# Patient Record
Sex: Male | Born: 1960 | Race: Black or African American | Hispanic: No | Marital: Single | Smoking: Current some day smoker
Health system: Southern US, Community
[De-identification: ages and names within clinical notes are randomized; demographics above are authoritative.]

## PROBLEM LIST (undated history)

## (undated) DIAGNOSIS — R945 Abnormal results of liver function studies: Secondary | ICD-10-CM

## (undated) DIAGNOSIS — D126 Benign neoplasm of colon, unspecified: Secondary | ICD-10-CM

## (undated) DIAGNOSIS — D649 Anemia, unspecified: Secondary | ICD-10-CM

## (undated) DIAGNOSIS — F329 Major depressive disorder, single episode, unspecified: Secondary | ICD-10-CM

## (undated) DIAGNOSIS — R7989 Other specified abnormal findings of blood chemistry: Secondary | ICD-10-CM

## (undated) DIAGNOSIS — F32A Depression, unspecified: Secondary | ICD-10-CM

## (undated) HISTORY — DX: Other specified abnormal findings of blood chemistry: R79.89

## (undated) HISTORY — PX: FRACTURE SURGERY: SHX138

## (undated) HISTORY — DX: Anemia, unspecified: D64.9

## (undated) HISTORY — DX: Benign neoplasm of colon, unspecified: D12.6

## (undated) HISTORY — DX: Major depressive disorder, single episode, unspecified: F32.9

## (undated) HISTORY — DX: Depression, unspecified: F32.A

## (undated) HISTORY — DX: Abnormal results of liver function studies: R94.5

---

## 2005-01-07 ENCOUNTER — Ambulatory Visit: Payer: Self-pay | Admitting: Internal Medicine

## 2006-05-15 ENCOUNTER — Ambulatory Visit: Payer: Self-pay | Admitting: Internal Medicine

## 2006-06-05 ENCOUNTER — Ambulatory Visit: Payer: Self-pay | Admitting: Internal Medicine

## 2011-08-02 ENCOUNTER — Encounter: Payer: Self-pay | Admitting: Internal Medicine

## 2011-10-18 ENCOUNTER — Encounter: Payer: Self-pay | Admitting: Internal Medicine

## 2011-11-14 ENCOUNTER — Encounter: Payer: Self-pay | Admitting: *Deleted

## 2011-11-22 ENCOUNTER — Ambulatory Visit: Payer: Self-pay | Admitting: Internal Medicine

## 2011-11-29 ENCOUNTER — Telehealth: Payer: Self-pay | Admitting: Internal Medicine

## 2011-11-29 NOTE — Telephone Encounter (Signed)
Message copied by Arna Snipe on Tue Nov 29, 2011  1:47 PM ------      Message from: Richardson Chiquito      Created: Tue Nov 22, 2011  1:11 PM      Regarding: FW: no show                   ----- Message -----         From: Hart Carwin, MD         Sent: 11/22/2011  12:56 PM           To: Vernia Buff, CMA      Subject: no show                                                  Please charge no show.      ----- Message -----         From: Vernia Buff, CMA         Sent: 11/22/2011  11:20 AM           To: Hart Carwin, MD            Patient no showed appointment with Dr Juanda Chance on 11/22/11. Dr Juanda Chance, do you want to charge no show fee?

## 2012-06-28 ENCOUNTER — Encounter: Payer: Self-pay | Admitting: Internal Medicine

## 2013-08-14 ENCOUNTER — Encounter (HOSPITAL_COMMUNITY): Payer: Self-pay | Admitting: Emergency Medicine

## 2013-08-14 ENCOUNTER — Emergency Department (HOSPITAL_COMMUNITY): Payer: Self-pay

## 2013-08-14 ENCOUNTER — Emergency Department (INDEPENDENT_AMBULATORY_CARE_PROVIDER_SITE_OTHER)
Admission: EM | Admit: 2013-08-14 | Discharge: 2013-08-14 | Disposition: A | Discharge status: Home / Self Care | Payer: Self-pay | Source: Home / Self Care

## 2013-08-14 DIAGNOSIS — L02619 Cutaneous abscess of unspecified foot: Secondary | ICD-10-CM

## 2013-08-14 DIAGNOSIS — L03032 Cellulitis of left toe: Secondary | ICD-10-CM

## 2013-08-14 MED ORDER — CLINDAMYCIN HCL 300 MG PO CAPS: 300.0000 mg | ORAL_CAPSULE | Freq: Four times a day (QID) | ORAL | Status: DC

## 2013-08-14 NOTE — ED Notes (Signed)
Went to get patient doctors still in with patient.

## 2013-08-14 NOTE — ED Provider Notes (Signed)
CSN: [id number]     Arrival date & time 08/14/13  1125 History   First MD Initiated Contact with Patient 08/14/13 1250     Chief Complaint  Patient presents with  . Insect Bite   (Consider location/radiation/quality/duration/timing/severity/associated sxs/prior Treatment) HPI Comments: This 52 year old male presents with a swollen red tender left great toe after he felt a sting to the dorsum of the toe 1 week ago. In the past 3 days it has gotten worse   Past Medical History  Diagnosis Date  . Adenomatous colon polyp   . Elevated LFTs   . Depression   . Anemia    History reviewed. No pertinent past surgical history. Family History  Problem Relation Age of Onset  . Heart disease    . Colon polyps Cousin     maternal  . Colon polyps Cousin   . Colon polyps Maternal Aunt   . Colon cancer Maternal Grandfather    History  Substance Use Topics  . Smoking status: Current Some Day Smoker  . Smokeless tobacco: Not on file  . Alcohol Use: Yes     Comment: 4 beers weekly    Review of Systems  Constitutional: Negative.   Respiratory: Negative.   Gastrointestinal: Negative.   Genitourinary: Negative.   Musculoskeletal:       As per HPI  Skin: Negative.   Neurological: Negative for dizziness, weakness, numbness and headaches.    Allergies  Review of patient's allergies indicates no known allergies.  Home Medications  No current outpatient prescriptions on file. BP 112/71  Pulse 77  Temp(Src) 97 F (36.1 C) (Oral)  Resp 20  SpO2 100% Physical Exam  Nursing note and vitals reviewed. Constitutional: He is oriented to person, place, and time. He appears well-developed and well-nourished.  HENT:  Head: Normocephalic and atraumatic.  Eyes: EOM are normal. Left eye exhibits no discharge.  Neck: Normal range of motion. Neck supple.  Pulmonary/Chest: Effort normal.  Musculoskeletal:  Redness and swelling primarily to the dorsal aspect of the great toe just proximal to the  base of the nail. It involves the distal phalanx and PIP joint. Redness extends to the MTP. He is able to flex and extend but with some discomfort. There is tenderness primarily over the IP joint. No drainage.  Neurological: He is alert and oriented to person, place, and time. No cranial nerve deficit.  Skin: Skin is warm and dry.  Psychiatric: He has a normal mood and affect.    ED Course  INCISION AND DRAINAGE Date/Time: 08/14/2013 1:30 PM Performed by: Phineas Real, Gillie Fleites Authorized by: Clementeen Graham, S Consent: Verbal consent obtained. Risks and benefits: risks, benefits and alternatives were discussed Consent given by: patient Patient understanding: patient states understanding of the procedure being performed Patient identity confirmed: verbally with patient Type: abscess Body area: lower extremity Location details: left big toe Anesthesia: local infiltration Local anesthetic: lidocaine 2% without epinephrine Anesthetic total: 1 ml Patient sedated: no Scalpel size: 11 Incision type: single straight Complexity: simple Drainage: bloody Drainage amount: scant Patient tolerance: Patient tolerated the procedure well with no immediate complications. Comments: dfressing applied   (including critical care time) Labs Review Labs Reviewed - No data to display Imaging Review No results found.     MDM   1. Cellulitis of great toe of left foot     Consulted with Dr. Denyse Amass U/S of toe per Dr. Denyse Amass does not reveal tenosyovitis.  Tangential incision with #11 blade released blood only, culture sent. Keflex qid  Return in 2 days for wound chk. Dressing applied.  Hayden Rasmussen, NP 08/14/13 1345

## 2013-08-14 NOTE — ED Provider Notes (Signed)
Limited musculoskeletal ultrasound of the left great toe. Ultrasound placed across the area of swelling overlying the interphalangeal joint on the dorsal aspect of the left great toe. Localized heterogeneous cystic structure are consistent with partial abscess or cellulitis located. No involvement of the joint itself. The dorsal tendon appear to be intact and unaffected extending proximally.  Consistent with cellulitis or abscess. Rodolph Bong, MD 08/14/13 [phone]

## 2013-08-14 NOTE — ED Notes (Signed)
C/o insect bite on left big toe for five days now.   States he took his socks off and the next day putting them on he felt a little prick.   States he does have numbness in bilateral feet.   Toe is red and swollen.

## 2013-08-15 NOTE — ED Provider Notes (Signed)
Medical screening examination/treatment/procedure(s) were performed by a resident physician or non-physician practitioner and as the supervising physician I was immediately available for consultation/collaboration.  Kymia Simi, MD    Caitlynn Ju S Leena Tiede, MD 08/15/13 0756 

## 2013-08-18 LAB — CULTURE, ROUTINE-ABSCESS: Culture: NO GROWTH

## 2013-09-14 ENCOUNTER — Emergency Department (INDEPENDENT_AMBULATORY_CARE_PROVIDER_SITE_OTHER)
Admission: EM | Admit: 2013-09-14 | Discharge: 2013-09-14 | Disposition: A | Discharge status: Home / Self Care | Payer: No Typology Code available for payment source | Source: Home / Self Care | Attending: Emergency Medicine | Admitting: Emergency Medicine

## 2013-09-14 ENCOUNTER — Emergency Department (INDEPENDENT_AMBULATORY_CARE_PROVIDER_SITE_OTHER): Payer: No Typology Code available for payment source

## 2013-09-14 ENCOUNTER — Encounter (HOSPITAL_COMMUNITY): Payer: Self-pay | Admitting: Emergency Medicine

## 2013-09-14 DIAGNOSIS — N189 Chronic kidney disease, unspecified: Secondary | ICD-10-CM

## 2013-09-14 DIAGNOSIS — G629 Polyneuropathy, unspecified: Secondary | ICD-10-CM

## 2013-09-14 DIAGNOSIS — G609 Hereditary and idiopathic neuropathy, unspecified: Secondary | ICD-10-CM

## 2013-09-14 DIAGNOSIS — Z23 Encounter for immunization: Secondary | ICD-10-CM

## 2013-09-14 DIAGNOSIS — M7989 Other specified soft tissue disorders: Secondary | ICD-10-CM

## 2013-09-14 LAB — POCT I-STAT, CHEM 8
Creatinine, Ser: 1.3 mg/dL (ref 0.50–1.35)
Glucose, Bld: 71 mg/dL (ref 70–99)
HCT: 40 % (ref 39.0–52.0)
Hemoglobin: 13.6 g/dL (ref 13.0–17.0)
Potassium: 4.2 mEq/L (ref 3.5–5.1)
Sodium: 143 mEq/L (ref 135–145)
TCO2: 25 mmol/L (ref 0–100)

## 2013-09-14 MED ORDER — TETANUS-DIPHTH-ACELL PERTUSSIS 5-2.5-18.5 LF-MCG/0.5 IM SUSP
INTRAMUSCULAR | Status: AC
  Filled 2013-09-14: qty 0.5

## 2013-09-14 MED ORDER — TETANUS-DIPHTH-ACELL PERTUSSIS 5-2.5-18.5 LF-MCG/0.5 IM SUSP
0.5000 mL | Freq: Once | INTRAMUSCULAR | Status: AC
  Administered 2013-09-14: 0.5 mL via INTRAMUSCULAR

## 2013-09-14 MED ORDER — PREDNISONE 10 MG PO TABS: ORAL_TABLET | ORAL | Status: DC

## 2013-09-14 NOTE — ED Provider Notes (Signed)
Chief Complaint:   Chief Complaint  Patient presents with  . Cellulitis    History of Present Illness:   Ryan [name] is a 52 year old male whose left foot swelled up about 3-1/2 weeks ago. He came here and was found to have a pustular lesion on his left great toe. This was incised and drained and he was placed on Cleocin. The culture never did grow out any bacteria. The swelling is gone down, but it still not back to normal and it hurts. He notes that both his feet have been numb for the past 2-1/2-3 years he denies any history of diabetes.  Review of Systems:  Other than noted above, the patient denies any of the following symptoms: Systemic:  No fevers, chills, or sweats.  No fatigue or tiredness. Musculoskeletal:  No joint pain, arthritis, bursitis, swelling, or back pain.  Neurological:  No muscular weakness, paresthesias.  PMFSH:  Past medical history, family history, social history, meds, and allergies were reviewed.  No history of gout.   Physical Exam:   Vital signs:  BP 105/75  Pulse 74  Temp(Src) 97.8 F (36.6 C) (Oral)  Resp 20  SpO2 100% Gen:  Alert and oriented times 3.  In no distress. Musculoskeletal:  Exam of the foot reveals slight puffiness and discoloration, but no erythema or heat.  Otherwise, all joints had a full a ROM with no swelling, bruising or deformity.  No edema, pulses full. Extremities were warm and pink.  Capillary refill was brisk.  Skin:  Clear, warm and dry.  No rash. Neuro:  Alert and oriented times 3.  Muscle strength was normal.  Sensation was intact to light touch.   Radiology:  Dg Foot Complete Left  09/14/2013   CLINICAL DATA:  History of swelling and pain along the 1st metatarsal and great toe.  EXAM: LEFT FOOT - COMPLETE 3+ VIEW  COMPARISON:  None.  FINDINGS: The bones superior diffusely osteopenic. No fracture or subluxation identified. No radio-opaque foreign body or soft tissue calcification. No focal bone erosions.  IMPRESSION: 1.  Osteopenia.   Electronically Signed   By: Signa Kell M.D.   On: 09/14/2013 10:50   I reviewed the images independently and personally and concur with the radiologist's findings.  Results for orders placed during the hospital encounter of 09/14/13  URIC ACID      Result Value Range   Uric Acid, Serum 9.0 (*) 4.0 - 7.8 mg/dL  POCT I-STAT, CHEM 8      Result Value Range   Sodium 143  135 - 145 mEq/L   Potassium 4.2  3.5 - 5.1 mEq/L   Chloride 108  96 - 112 mEq/L   BUN 6  6 - 23 mg/dL   Creatinine, Ser 1.61  0.50 - 1.35 mg/dL   Glucose, Bld 71  70 - 99 mg/dL   Calcium, Ion 0.96  0.45 - 1.23 mmol/L   TCO2 25  0 - 100 mmol/L   Hemoglobin 13.6  13.0 - 17.0 g/dL   HCT 40.9  81.1 - 91.4 %   Assessment:  The primary encounter diagnosis was Foot swelling. Diagnoses of Peripheral neuropathy and Chronic kidney disease were also pertinent to this visit.  Plan:   1.  Meds:  The following meds were prescribed:   Discharge Medication List as of 09/14/2013 11:32 AM    START taking these medications   Details  predniSONE (DELTASONE) 10 MG tablet Take 4 tabs daily for 4 days, 3 tabs daily  for 4 days, 2 tabs daily for 4 days, then 1 tab daily for 4 days., Normal        2.  Patient Education/Counseling:  The patient was given appropriate handouts, self care instructions, and instructed in symptomatic relief including rest and activity, elevation, application of ice and compression.   3.  Follow up:  The patient was told to follow up if no better in 3 to 4 days, if becoming worse in any way, and given some red flag symptoms such as persistent inflammation which would prompt immediate return.  Follow up with a podiatrist for further evaluation the foot pain, and also with a nephrologist because of his mildly elevated creatinine.       Reuben Likes, MD 09/14/13 2027
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
## 2013-09-16 NOTE — ED Notes (Signed)
12/7 Uric Acid 9.0 H.  Treated with Prednisone.  Message sent to Dr. Lorenz Coaster.  Dr. Lorenz Coaster wrote back that  he already notified the pt. of result and to f/u with primary physician. Vassie Moselle 09/16/2013

## 2013-09-20 NOTE — ED Notes (Signed)
Patient had called earlier to request something for pain . Spoke w Dr Lorenz Coaster , who is aware pt has not yet filled his Rx for prednisone w  [facility], wishes patient to fill his Rx and take as written. Pt asking for rx to be called in to [facility] [address] @ [phone]) patient states they are closed on weekend. Patient was advised he should keep his foot elevated, use heat or cold (which ever gives him the most relief) , and use advil or tylenol for pain, and then start the Rx ASAP on Monday. Called number for Family [facility], and left message for answering service to have RN on call contact us regarding transfer of Rx to their pharmacy

## 2013-09-20 NOTE — ED Notes (Signed)
Ryan [name] from patient. C/o he has never been called about his lab reports . Discussed lab findings related to gout. States he has never filled his Rx for prednisone, and was advised to have this filled and take as directed, and to follow up w Dr Charlsie Merles as directed for his foot

## 2013-09-20 NOTE — ED Notes (Signed)
[facility] now closed, and no return call as yet from on call Rn for pt rx. Patient notified, and he asks we try again on Monday, did not want another pharmacy called for his Rx

## 2014-10-22 ENCOUNTER — Emergency Department (HOSPITAL_COMMUNITY)
Admission: EM | Admit: 2014-10-22 | Discharge: 2014-10-22 | Disposition: A | Discharge status: Home / Self Care | Payer: Self-pay | Attending: Emergency Medicine | Admitting: Emergency Medicine

## 2014-10-22 ENCOUNTER — Encounter (HOSPITAL_COMMUNITY): Payer: Self-pay | Admitting: Emergency Medicine

## 2014-10-22 DIAGNOSIS — S0181XA Laceration without foreign body of other part of head, initial encounter: Secondary | ICD-10-CM | POA: Insufficient documentation

## 2014-10-22 DIAGNOSIS — Y998 Other external cause status: Secondary | ICD-10-CM | POA: Insufficient documentation

## 2014-10-22 DIAGNOSIS — Z720 Tobacco use: Secondary | ICD-10-CM | POA: Insufficient documentation

## 2014-10-22 DIAGNOSIS — Z7952 Long term (current) use of systemic steroids: Secondary | ICD-10-CM | POA: Insufficient documentation

## 2014-10-22 DIAGNOSIS — Y9389 Activity, other specified: Secondary | ICD-10-CM | POA: Insufficient documentation

## 2014-10-22 DIAGNOSIS — W108XXA Fall (on) (from) other stairs and steps, initial encounter: Secondary | ICD-10-CM | POA: Insufficient documentation

## 2014-10-22 DIAGNOSIS — Z86010 Personal history of colonic polyps: Secondary | ICD-10-CM | POA: Insufficient documentation

## 2014-10-22 DIAGNOSIS — Z792 Long term (current) use of antibiotics: Secondary | ICD-10-CM | POA: Insufficient documentation

## 2014-10-22 DIAGNOSIS — W19XXXA Unspecified fall, initial encounter: Secondary | ICD-10-CM

## 2014-10-22 DIAGNOSIS — Y9289 Other specified places as the place of occurrence of the external cause: Secondary | ICD-10-CM | POA: Insufficient documentation

## 2014-10-22 DIAGNOSIS — Z862 Personal history of diseases of the blood and blood-forming organs and certain disorders involving the immune mechanism: Secondary | ICD-10-CM | POA: Insufficient documentation

## 2014-10-22 DIAGNOSIS — Z8659 Personal history of other mental and behavioral disorders: Secondary | ICD-10-CM | POA: Insufficient documentation

## 2014-10-22 MED ORDER — LIDOCAINE-EPINEPHRINE (PF) 2 %-1:200000 IJ SOLN
10.0000 mL | Freq: Once | INTRAMUSCULAR | Status: AC
  Administered 2014-10-22: 10 mL
  Filled 2014-10-22: qty 20

## 2014-10-22 MED ORDER — NAPROXEN 500 MG PO TABS: 500.0000 mg | ORAL_TABLET | Freq: Two times a day (BID) | ORAL | Status: DC | PRN

## 2014-10-22 MED ORDER — HYDROCODONE-ACETAMINOPHEN 5-325 MG PO TABS: 1.0000 | ORAL_TABLET | Freq: Four times a day (QID) | ORAL | Status: DC | PRN

## 2014-10-22 MED ORDER — BACITRACIN ZINC 500 UNIT/GM EX OINT: 1.0000 "application " | TOPICAL_OINTMENT | Freq: Two times a day (BID) | CUTANEOUS | Status: DC

## 2014-10-22 MED ORDER — AMOXICILLIN-POT CLAVULANATE 875-125 MG PO TABS: 1.0000 | ORAL_TABLET | Freq: Two times a day (BID) | ORAL | Status: DC

## 2014-10-22 MED ORDER — IBUPROFEN 400 MG PO TABS
400.0000 mg | ORAL_TABLET | Freq: Once | ORAL | Status: AC
  Administered 2014-10-22: 400 mg via ORAL
  Filled 2014-10-22: qty 1

## 2014-10-22 NOTE — ED Notes (Signed)
Patient states he fell on concrete steps last night while carrying wood and he fell on a piece of wood and split chin.   Bleeding controlled.

## 2014-10-22 NOTE — ED Provider Notes (Signed)
CSN: [id number]     Arrival date & time 10/22/14  5397 History  This chart was scribed for non-physician practitioner, Zacarias Pontes, PA-C working with Fredia Sorrow, MD by Frederich Balding, ED scribe. This patient was seen in room TR10C/TR10C and the patient's care was started at 10:34 AM.   Chief Complaint  Patient presents with  . chin laceration    Patient is a 54 y.o. male presenting with skin laceration. The history is provided by the patient. No language interpreter was used.  Laceration Location:  Face Facial laceration location:  Chin Length (cm):  2 Depth:  Cutaneous Bleeding: controlled   Time since incident:  11 hours Injury mechanism: wood. Pain details:    Quality:  Dull   Severity:  Moderate   Timing:  Intermittent   Progression:  Unchanged Foreign body present:  No foreign bodies Relieved by:  None tried Worsened by:  Movement Ineffective treatments:  None tried Tetanus status:  Up to date  HPI Comments: Ryan [name] is a 54 y.o. male who presents to the Emergency Department complaining of a laceration to his chin that occurred last night around 11 PM. He fell while going down concrete steps while carrying wood and states a piece of wood split his chin. Denies LOC. Rates pain around the laceration 5/10 and describes it as intermittent and dull. Pain does not radiate. Opening his mouth worsens it. He has not taken any medications. Denies loose teeth, blurry vision, chest pain, SOB, abdominal pain, nausea, emesis, neck pain, headaches, dizziness, numbness, weakness. Pt is not currently on any blood thinners. His last tetanus was a few months ago.   Past Medical History  Diagnosis Date  . Adenomatous colon polyp   . Elevated LFTs   . Depression   . Anemia    History reviewed. No pertinent past surgical history. Family History  Problem Relation Age of Onset  . Heart disease    . Colon polyps Cousin     maternal  . Colon polyps Cousin   . Colon polyps  Maternal Aunt   . Colon cancer Maternal Grandfather    History  Substance Use Topics  . Smoking status: Current Some Day Smoker  . Smokeless tobacco: Not on file  . Alcohol Use: Yes     Comment: 4 beers weekly    Review of Systems  HENT: Negative for dental problem.   Eyes: Negative for visual disturbance.  Respiratory: Negative for shortness of breath.   Cardiovascular: Negative for chest pain.  Gastrointestinal: Negative for nausea, vomiting and abdominal pain.  Musculoskeletal: Negative for neck pain.  Skin: Positive for wound.  Neurological: Negative for dizziness, weakness, numbness and headaches.  All other systems reviewed and are negative. 10 systems reviewed and are negative for acute changes except as noted in the HPI.   Allergies  Review of patient's allergies indicates no known allergies.  Home Medications   Prior to Admission medications   Medication Sig Start Date End Date Taking? Authorizing Provider  clindamycin (CLEOCIN) 300 MG capsule Take 1 capsule (300 mg total) by mouth 4 (four) times daily. X 7 days 08/14/13   Janne Napoleon, NP  predniSONE (DELTASONE) 10 MG tablet Take 4 tabs daily for 4 days, 3 tabs daily for 4 days, 2 tabs daily for 4 days, then 1 tab daily for 4 days. 09/14/13   Harden Mo, MD   BP 111/78 mmHg  Pulse 94  Temp(Src) 98.2 F (36.8 C) (Oral)  Resp 18  SpO2 100%   Physical Exam  Constitutional: He is oriented to person, place, and time. Vital signs are normal. He appears well-developed and well-nourished.  Non-toxic appearance. No distress.  Afebrile, non toxic, NAD  HENT:  Head: Normocephalic and atraumatic.    Mouth/Throat: Mucous membranes are normal.  Approx 2 cm laceration to underside of right chin. Bleeding well controlled, extension into the subcutaneous fat, no foreign bodies present. Mild tenderness to palpation to mandible in this area, with no bony crepitus. No bony TTP on remainder of mandible or TMJ. FROM intact at b/l  TMJ.   Eyes: Conjunctivae and EOM are normal.  Neck: Normal range of motion. Neck supple. No spinous process tenderness and no muscular tenderness present.  FROM intact without spinous process or paraspinous muscle TTP, no bony stepoffs or deformities, no muscle spasms.  Cardiovascular: Normal rate and intact distal pulses.   Pulmonary/Chest: Effort normal. No respiratory distress.  Abdominal: Normal appearance. He exhibits no distension.  Musculoskeletal: Normal range of motion.  MAE x4  Neurological: He is alert and oriented to person, place, and time. He has normal strength. No sensory deficit. Gait normal.  Skin: Skin is warm and dry. Laceration noted.  ~2cm lac to chin as noted above  Psychiatric: He has a normal mood and affect. His behavior is normal.  Nursing note and vitals reviewed.   ED Course  LACERATION REPAIR Date/Time: 10/22/2014 11:07 AM Performed by: Shann Medal, Alain Deschene STRUPP Authorized by: Corine Shelter Consent: Verbal consent obtained. Risks and benefits: risks, benefits and alternatives were discussed Consent given by: patient Patient understanding: patient states understanding of the procedure being performed Patient consent: the patient's understanding of the procedure matches consent given Patient identity confirmed: verbally with patient Body area: head/neck Location details: chin Laceration length: 2 cm Foreign bodies: no foreign bodies Tendon involvement: none Nerve involvement: none Vascular damage: no Anesthesia: local infiltration Local anesthetic: lidocaine 2% with epinephrine Anesthetic total: 1 ml Patient sedated: no Preparation: Patient was prepped and draped in the usual sterile fashion. Irrigation solution: saline Irrigation method: syringe Amount of cleaning: standard Debridement: none Degree of undermining: none Skin closure: 5-0 Prolene Number of sutures: 3 Technique: simple Approximation: close Approximation  difficulty: simple Dressing: 4x4 sterile gauze Patient tolerance: Patient tolerated the procedure well with no immediate complications   (including critical care time)  DIAGNOSTIC STUDIES: Oxygen Saturation is 100% on RA, normal by my interpretation.    COORDINATION OF CARE: 10:38 AM-Pt declined xray. Discussed treatment plan which includes laceration repair with pt at bedside and pt agreed to plan.   11:07 AM-Laceration repair started. 1 mL 2% lidocaine with epi and 5-0 Prolene used. Wound closed with 3 sutures.   Labs Review Labs Reviewed - No data to display  Imaging Review No results found.   EKG Interpretation None      MDM   Final diagnoses:  Chin laceration, initial encounter  Fall, initial encounter    54 y.o. male with chin lac 11hrs PTA. Bleeding controlled, small amount of oozing. Tetanus UTD. Closed area since pt was within 12hrs, and it's to his face, therefore for improved cosmesis, proceeded with suture repair. Hemostasis achieved. Dressing provided. Given that pt was delayed in coming, will start on augmentin. Will have him see his PCP/urgent care in 10 days for suture removal. Discussed proper wound care. Of note, Pt declined xrays of mandible, given that he had some TTP. Low clinical suspicion for occult fx but discussed that if there were any  fractures, it would be considered open fx, and the risks of worsened deep infection would be higher. Pt accepts the risks and proceeded with no imaging. I explained the diagnosis and have given explicit precautions to return to the ER including for any other new or worsening symptoms. The patient understands and accepts the medical plan as it's been dictated and I have answered their questions. Discharge instructions concerning home care and prescriptions have been given. The patient is STABLE and is discharged to home in good condition.  BP 111/78 mmHg  Pulse 94  Temp(Src) 98.2 F (36.8 C) (Oral)  Resp 18  SpO2  100%  Meds ordered this encounter  Medications  . ibuprofen (ADVIL,MOTRIN) tablet 400 mg    Sig:   . lidocaine-EPINEPHrine (XYLOCAINE W/EPI) 2 %-1:200000 (PF) injection 10 mL    Sig:   . amoxicillin-clavulanate (AUGMENTIN) 875-125 MG per tablet    Sig: Take 1 tablet by mouth 2 (two) times daily. One po bid x 7 days    Dispense:  14 tablet    Refill:  0    Order Specific Question:  Supervising Provider    Answer:  Noemi Chapel D [[id number]]  . HYDROcodone-acetaminophen (NORCO) 5-325 MG per tablet    Sig: Take 1 tablet by mouth every 6 (six) hours as needed for severe pain.    Dispense:  6 tablet    Refill:  0    Order Specific Question:  Supervising Provider    Answer:  Noemi Chapel D [[id number]]  . naproxen (NAPROSYN) 500 MG tablet    Sig: Take 1 tablet (500 mg total) by mouth 2 (two) times daily as needed for mild pain, moderate pain or headache (TAKE WITH MEALS.).    Dispense:  20 tablet    Refill:  0    Order Specific Question:  Supervising Provider    Answer:  Noemi Chapel D [[id number]]  . bacitracin ointment    Sig: Apply 1 application topically 2 (two) times daily.    Dispense:  120 g    Refill:  0    Order Specific Question:  Supervising Provider    Answer:  Noemi Chapel D [[id number]]     I personally performed the services described in this documentation, which was scribed in my presence. The recorded information has been reviewed and is accurate.  Patty Sermons Fox, [place] 10/22/14 1131  Fredia Sorrow, MD 10/23/14 0930

## 2014-10-22 NOTE — Discharge Instructions (Signed)
Keep wound and clean with mild soap and water. Keep area covered with a topical antibiotic ointment (bacitracin) and bandage, keep bandage dry, and do not submerge in water for 24 hours. Ice and elevate for additional pain relief and swelling. Alternate between naprosyn and Tylenol for additional pain relief. Use norco as directed for severe pain, but don't drive while taking this medicine. Don't exceed more than 3,000mg  of tylenol per day, and norco has 325mg  per tablet. Take Augmentin as directed to prevent infection. Follow up with your primary care doctor or the [facility] [place] in approximately 7-10 days for wound recheck and suture removal. Monitor area for signs of infection to include, but not limited to: increasing pain, redness, drainage/pus, or swelling. Return to emergency department for emergent changing or worsening symptoms.    Facial Laceration A facial laceration is a cut on the face. These injuries can be painful and cause bleeding. Some cuts may need to be closed with stitches (sutures), skin adhesive strips, or wound glue. Cuts usually heal quickly but can leave a scar. It can take 1-2 years for the scar to go away completely. HOME CARE   Only take medicines as told by your doctor.  Follow your doctor's instructions for wound care. For Stitches:  Keep the cut clean and dry.  If you have a bandage (dressing), change it at least once a day. Change the bandage if it gets wet or dirty, or as told by your doctor.  Wash the cut with soap and water 2 times a day. Rinse the cut with water. Pat it dry with a clean towel.  Put a thin layer of medicated cream on the cut as told by your doctor.  You may shower after the first 24 hours. Do not soak the cut in water until the stitches are removed.  Have your stitches removed as told by your doctor.  Do not wear any makeup until a few days after your stitches are removed. For Skin Adhesive Strips:  Keep the cut clean and  dry.  Do not get the strips wet. You may take a bath, but be careful to keep the cut dry.  If the cut gets wet, pat it dry with a clean towel.  The strips will fall off on their own. Do not remove the strips that are still stuck to the cut. For Wound Glue:  You may shower or take baths. Do not soak or scrub the cut. Do not swim. Avoid heavy sweating until the glue falls off on its own. After a shower or bath, pat the cut dry with a clean towel.  Do not put medicine or makeup on your cut until the glue falls off.  If you have a bandage, do not put tape over the glue.  Avoid lots of sunlight or tanning lamps until the glue falls off.  The glue will fall off on its own in 5-10 days. Do not pick at the glue. After Healing: Put sunscreen on the cut for the first year to reduce your scar. GET HELP RIGHT AWAY IF:   Your cut area gets red, painful, or puffy (swollen).  You see a yellowish-white fluid (pus) coming from the cut.  You have chills or a fever. MAKE SURE YOU:   Understand these instructions.  Will watch your condition.  Will get help right away if you are not doing well or get worse. Document Released: 03/14/2008 Document Revised: 07/17/2013 Document Reviewed: 05/09/2013 [employer] Patient Information 2015  [employer]. This information is not intended to replace advice given to you by your health care provider. Make sure you discuss any questions you have with your health care provider.  Stitches, Staples, or Skin Adhesive Strips  Stitches (sutures), staples, and skin adhesive strips hold the skin together as it heals. They will usually be in place for 7 days or less. HOME CARE  Wash your hands with soap and water before and after you touch your wound.  Only take medicine as told by your doctor.  Cover your wound only if your doctor told you to. Otherwise, leave it open to air.  Do not get your stitches wet or dirty. If they get dirty, dab them gently with a clean  washcloth. Wet the washcloth with soapy water. Do not rub. Pat them dry gently.  Do not put medicine or medicated cream on your stitches unless your doctor told you to.  Do not take out your own stitches or staples. Skin adhesive strips will fall off by themselves.  Do not pick at the wound. Picking can cause an infection.  Do not miss your follow-up appointment.  If you have problems or questions, call your doctor. GET HELP RIGHT AWAY IF:   You have a temperature by mouth above 102 F (38.9 C), not controlled by medicine.  You have chills.  You have redness or pain around your stitches.  There is puffiness (swelling) around your stitches.  You notice fluid (drainage) from your stitches.  There is a bad smell coming from your wound. MAKE SURE YOU:  Understand these instructions.  Will watch your condition.  Will get help if you are not doing well or get worse. Document Released: 07/24/2009 Document Revised: 12/19/2011 Document Reviewed: 07/24/2009 [facility] Patient Information 2015 [place], [place]. This information is not intended to replace advice given to you by your health care provider. Make sure you discuss any questions you have with your health care provider.   Sutured Wound Care Sutures are stitches that can be used to close wounds. Caring for your wound can help stop infection and lessen pain. HOME CARE   Rest and raise (elevate) the injured area until the pain and puffiness (swelling) go away.  Only take medicines as told by your doctor.  Clean the wound gently with mild soap and water once a day after the first 2 days. Rinse off the soap. Pat the area dry with a clean towel. Do not rub the wound.  Change the bandage (dressing) as told by your doctor. If the bandage sticks, soak it off with soapy water. Stop using a bandage after 2 days or after the wound stops leaking fluid.  Put cream on the wound as told by your doctor.  Do not stretch the wound.  Drink  enough fluids to keep your pee (urine) clear or pale yellow.  See your doctor to have the sutures removed.  Use sunscreen or sunblock on the wound after it heals. GET HELP RIGHT AWAY IF:   Your wound gets red, puffy, hot, or tender.  You have more pain in the wound.  You have a red streak that goes away from the wound.  You see yellowish-white fluid (pus) coming out of the wound.  You have a fever.  You have chills and start to shake.  You notice a bad smell coming from the wound.  Your wound will not stop bleeding. MAKE SURE YOU:   Understand these instructions.  Will watch your condition.  Will  get help right away if you are not doing well or get worse. Document Released: 03/14/2008 Document Revised: 12/19/2011 Document Reviewed: 01/30/2011 [facility] Patient Information 2015 [place], [place]. This information is not intended to replace advice given to you by your health care provider. Make sure you discuss any questions you have with your health care provider.

## 2014-11-08 ENCOUNTER — Emergency Department: Payer: Self-pay | Admitting: Emergency Medicine

## 2018-03-11 ENCOUNTER — Emergency Department
Admission: EM | Admit: 2018-03-11 | Discharge: 2018-03-11 | Disposition: A | Discharge status: Home / Self Care | Payer: Self-pay | Attending: Emergency Medicine | Admitting: Emergency Medicine

## 2018-03-11 ENCOUNTER — Encounter: Payer: Self-pay | Admitting: Emergency Medicine

## 2018-03-11 DIAGNOSIS — Z79899 Other long term (current) drug therapy: Secondary | ICD-10-CM | POA: Insufficient documentation

## 2018-03-11 DIAGNOSIS — H6123 Impacted cerumen, bilateral: Secondary | ICD-10-CM | POA: Insufficient documentation

## 2018-03-11 DIAGNOSIS — F17200 Nicotine dependence, unspecified, uncomplicated: Secondary | ICD-10-CM | POA: Insufficient documentation

## 2018-03-11 MED ORDER — DOCUSATE SODIUM 50 MG/5ML PO LIQD
25.0000 mg | Freq: Once | ORAL | Status: AC
  Administered 2018-03-11: 25 mg via ORAL
  Filled 2018-03-11: qty 10

## 2018-03-11 NOTE — Discharge Instructions (Signed)
We have removed the wax from your ears.  You can try Debrox 2 times a week to prevent earwax recurrence.

## 2018-03-11 NOTE — ED Triage Notes (Signed)
Patient presents to the ED with bilateral ear pain.  Patient states, "I've been pulling a lot of wax out of my head for a week and a half."  Patient states his hearing has been getting worse and it's affecting my work.

## 2018-03-11 NOTE — ED Provider Notes (Signed)
[facility] Emergency Department Provider Note ____________________________________________  Time seen: 0935  I have reviewed the triage vital signs and the nursing notes.  HISTORY  Chief Complaint  Otalgia   HPI Ryan [name] is a 57 y.o. male who presents to the ER today with complaint of bilateral ear fullness, hearing loss and wax buildup.  He reports this started 1-1/2 weeks ago.  He denies ear pain, discharge or trauma.  He has tried Hydrogen Peroxide and boiling Colace tablets to put in his ear.   Past Medical History:  Diagnosis Date  . Adenomatous colon polyp   . Anemia   . Depression   . Elevated LFTs     There are no active problems to display for this patient.   No past surgical history on file.  Prior to Admission medications   Medication Sig Start Date End Date Taking? Authorizing Provider  amoxicillin-clavulanate (AUGMENTIN) 875-125 MG per tablet Take 1 tablet by mouth 2 (two) times daily. One po bid x 7 days 10/22/14   Street, Hanaf[place], PA-C  bacitracin ointment Apply 1 application topically 2 (two) times daily. 10/22/14   Street, Mercedes, PA-C  clindamycin (CLEOCIN) 300 MG capsule Take 1 capsule (300 mg total) by mouth 4 (four) times daily. X 7 days 08/14/13   Janne Napoleon, NP  HYDROcodone-acetaminophen (NORCO) 5-325 MG per tablet Take 1 tablet by mouth every 6 (six) hours as needed for severe pain. 10/22/14   Street, Mona, PA-C  naproxen (NAPROSYN) 500 MG tablet Take 1 tablet (500 mg total) by mouth 2 (two) times daily as needed for mild pain, moderate pain or headache (TAKE WITH MEALS.). 10/22/14   Street, Mercedes, PA-C  predniSONE (DELTASONE) 10 MG tablet Take 4 tabs daily for 4 days, 3 tabs daily for 4 days, 2 tabs daily for 4 days, then 1 tab daily for 4 days. 09/14/13   Harden Mo, MD    Allergies Patient has no known allergies.  Family History  Problem Relation Age of Onset  . Heart disease Unknown   . Colon polyps Cousin         maternal  . Colon polyps Cousin   . Colon polyps Maternal Aunt   . Colon cancer Maternal Grandfather     Social History Social History   Tobacco Use  . Smoking status: Current Some Day Smoker  Substance Use Topics  . Alcohol use: Yes    Comment: 4 beers weekly  . Drug use: No    Review of Systems  Constitutional: Negative for fever. ENT: Positive for ear fullness, wax buildup and decreased hearing. ____________________________________________  PHYSICAL EXAM:  VITAL SIGNS: ED Triage Vitals  Enc Vitals Group     BP 03/11/18 0832 111/65     Pulse Rate 03/11/18 0832 81     Resp 03/11/18 0832 18     Temp 03/11/18 0832 98.5 F (36.9 C)     Temp Source 03/11/18 0832 Oral     SpO2 03/11/18 0832 100 %     Weight 03/11/18 0830 155 lb (70.3 kg)     Height 03/11/18 0830 6' 5.5" (1.969 m)     Head Circumference --      Peak Flow --      Pain Score 03/11/18 0829 3     Pain Loc --      Pain Edu? --      Excl. in [place]? --     Constitutional: Alert and oriented. Well appearing and in no distress.  Head: Normocephalic and atraumatic. Ears: Bilateral cerumen impaction. _____________________________________________  PROCEDURES  .Ear Cerumen Removal Date/Time: 03/11/2018 10:13 AM Performed by: McLamb, Berniece Salines, RN Authorized by: Jearld Fenton, NP   Consent:    Consent obtained:  Verbal   Consent given by:  Patient   Risks discussed:  Pain, dizziness and incomplete removal Procedure details:    Location:  L ear and R ear   Procedure type: irrigation   Post-procedure details:    Inspection:  Macerated skin and TM intact   Hearing quality:  Improved   Patient tolerance of procedure:  Tolerated well, no immediate complications   ____________________________________________  INITIAL IMPRESSION / ASSESSMENT AND PLAN / ED COURSE Bilateral Ear Fullness, Hearing Loss secondary to Cerumen Impaction:  Colace drops placed in bilateral ears  Manual lavage by RN Advised  him to try Debrox drops 2 times a week to prevent wax recurrence No indication of infection at this time  FINAL CLINICAL IMPRESSION(S) / ED DIAGNOSES  Final diagnoses:  Hearing loss of both ears due to cerumen impaction      Jearld Fenton, NP 03/11/18 [place]    Arta Silence, MD 03/11/18 1338

## 2018-03-11 NOTE — ED Notes (Signed)
See triage note. States he developed some possible wax build up to both ears  States he has used ear drops and liquid colace  But states he is still having decreased hearing to both ears

## 2018-07-16 ENCOUNTER — Other Ambulatory Visit: Payer: Self-pay

## 2018-07-19 LAB — PSA

## 2019-10-14 ENCOUNTER — Other Ambulatory Visit: Payer: Self-pay

## 2019-10-14 ENCOUNTER — Emergency Department
Admission: EM | Admit: 2019-10-14 | Discharge: 2019-10-14 | Disposition: A | Discharge status: Home / Self Care | Payer: Self-pay | Attending: Emergency Medicine | Admitting: Emergency Medicine

## 2019-10-14 DIAGNOSIS — Z79899 Other long term (current) drug therapy: Secondary | ICD-10-CM | POA: Insufficient documentation

## 2019-10-14 DIAGNOSIS — F17200 Nicotine dependence, unspecified, uncomplicated: Secondary | ICD-10-CM | POA: Insufficient documentation

## 2019-10-14 DIAGNOSIS — K047 Periapical abscess without sinus: Secondary | ICD-10-CM | POA: Insufficient documentation

## 2019-10-14 MED ORDER — AMOXICILLIN 500 MG PO TABS: 500.0000 mg | ORAL_TABLET | Freq: Three times a day (TID) | ORAL | 0 refills | Status: DC

## 2019-10-14 NOTE — ED Provider Notes (Signed)
[facility] Emergency Department Provider Note  ____________________________________________  Time seen: Approximately 2:49 PM  I have reviewed the triage vital signs and the nursing notes.   HISTORY  Chief Complaint Facial Swelling   HPI Ryan [name] is a 59 y.o. male who presents to the emergency department for treatment and evaluation of facial pain and swelling. Symptoms started 2 days ago. Pain got worse overnight. No relief with Advil. No fever.  Past Medical History:  Diagnosis Date  . Adenomatous colon polyp   . Anemia   . Depression   . Elevated LFTs     There are no problems to display for this patient.   History reviewed. No pertinent surgical history.  Prior to Admission medications   Medication Sig Start Date End Date Taking? Authorizing Provider  amoxicillin (AMOXIL) 500 MG tablet Take 1 tablet (500 mg total) by mouth 3 (three) times daily. 10/14/19   Ryker [place], Johnette Abraham B, FNP  bacitracin ointment Apply 1 application topically 2 (two) times daily. 10/22/14   Street, Mercedes, PA-C  clindamycin (CLEOCIN) 300 MG capsule Take 1 capsule (300 mg total) by mouth 4 (four) times daily. X 7 days 08/14/13   Janne Napoleon, NP  predniSONE (DELTASONE) 10 MG tablet Take 4 tabs daily for 4 days, 3 tabs daily for 4 days, 2 tabs daily for 4 days, then 1 tab daily for 4 days. 09/14/13   Harden Mo, MD    Allergies Patient has no known allergies.  Family History  Problem Relation Age of Onset  . Heart disease Other   . Colon polyps Cousin        maternal  . Colon polyps Cousin   . Colon polyps Maternal Aunt   . Colon cancer Maternal Grandfather     Social History Social History   Tobacco Use  . Smoking status: Current Some Day Smoker  . Smokeless tobacco: Never Used  Substance Use Topics  . Alcohol use: Yes    Comment: 4 beers weekly  . Drug use: No    Review of Systems  Constitutional: Negative for fever. Respiratory: Negative for cough or  shortness of breath.  Musculoskeletal: Negative for myalgias Skin: Positive for facial swelling. Neurological: Negative for numbness or paresthesias. ____________________________________________   PHYSICAL EXAM:  VITAL SIGNS: ED Triage Vitals  Enc Vitals Group     BP 10/14/19 1357 109/63     Pulse Rate 10/14/19 1357 82     Resp 10/14/19 1357 16     Temp 10/14/19 1357 98.1 F (36.7 C)     Temp Source 10/14/19 1357 Oral     SpO2 10/14/19 1357 100 %     Weight 10/14/19 1358 160 lb (72.6 kg)     Height 10/14/19 1358 6\' 5"  (1.956 m)     Head Circumference --      Peak Flow --      Pain Score 10/14/19 1357 8     Pain Loc --      Pain Edu? --      Excl. in [place]? --      Constitutional: Well appearing. Eyes: Conjunctivae are clear without discharge or drainage. Nose: No rhinorrhea noted. Mouth/Throat: Airway is patent. Chronic appearing dental fracture with surrounding gingival erythema and edema with associated facial swelling. Neck: No stridor. Unrestricted range of motion observed. Cardiovascular: Capillary refill is <3 seconds.  Respiratory: Respirations are even and unlabored.. Musculoskeletal: Unrestricted range of motion observed. Neurologic: Awake, alert, and oriented x 4.  Skin: left  side facial swelling overlying area of dental infection.  ____________________________________________   LABS (all labs ordered are listed, but only abnormal results are displayed)  Labs Reviewed - No data to display ____________________________________________  EKG  Not indicated. ____________________________________________  RADIOLOGY  Not indicated. ____________________________________________   PROCEDURES  Procedures ____________________________________________   INITIAL IMPRESSION / ASSESSMENT AND PLAN / ED COURSE  Hrihaan Hulet is a 58 y.o. male presenting to the emergency department for treatment of facial swelling.  See HPI for further details.  Patient has an  obvious dental abscess.  He will be treated with amoxicillin and advised to call and schedule a follow-up appointment with the dentist as soon as possible.  He was given the number for [facility] and is to call them this afternoon.  He is to take the antibiotic as prescribed and until finished.  He will take ibuprofen or Tylenol if needed for pain.  He was also encouraged to use a warm salt water rinse several times a day.  If his symptoms change or worsen and he is unable to get in with the dentist, he is to return to the emergency department.   Medications - No data to display   Pertinent labs & imaging results that were available during my care of the patient were reviewed by me and considered in my medical decision making (see chart for details).  ____________________________________________   FINAL CLINICAL IMPRESSION(S) / ED DIAGNOSES  Final diagnoses:  Dental abscess    ED Discharge Orders         Ordered    amoxicillin (AMOXIL) 500 MG tablet  3 times daily     10/14/19 1457           Note:  This document was prepared using Dragon voice recognition software and may include unintentional dictation errors.   Victorino Dike, FNP 10/14/19 1554    Arta Silence, MD 10/14/19 1600

## 2019-10-14 NOTE — ED Notes (Signed)
Pt c/o L sided facial swelling and pain x several days. Pt states he thinks he has a dental abscess at this time. Pt with mild swelling noted at this time.

## 2019-10-14 NOTE — ED Triage Notes (Signed)
Pt c/o swelling and pain to the left side of face/nose, minimal swelling noted at present denies dental pain

## 2019-10-14 NOTE — ED Notes (Signed)
NAD noted at time of D/C. Pt denies questions or concerns. Pt ambulatory to the lobby at this time.  

## 2020-04-01 ENCOUNTER — Inpatient Hospital Stay
Admission: AD | Admit: 2020-04-01 | Discharge: 2020-04-06 | DRG: 481 | Disposition: A | Discharge status: Home Health | Payer: Self-pay | Attending: Internal Medicine | Admitting: Internal Medicine

## 2020-04-01 ENCOUNTER — Emergency Department: Payer: Self-pay

## 2020-04-01 ENCOUNTER — Other Ambulatory Visit: Payer: Self-pay

## 2020-04-01 ENCOUNTER — Encounter: Payer: Self-pay | Admitting: Internal Medicine

## 2020-04-01 DIAGNOSIS — Y92008 Other place in unspecified non-institutional (private) residence as the place of occurrence of the external cause: Secondary | ICD-10-CM

## 2020-04-01 DIAGNOSIS — S72001A Fracture of unspecified part of neck of right femur, initial encounter for closed fracture: Secondary | ICD-10-CM | POA: Diagnosis present

## 2020-04-01 DIAGNOSIS — S2239XA Fracture of one rib, unspecified side, initial encounter for closed fracture: Secondary | ICD-10-CM | POA: Diagnosis present

## 2020-04-01 DIAGNOSIS — Z20822 Contact with and (suspected) exposure to covid-19: Secondary | ICD-10-CM | POA: Diagnosis present

## 2020-04-01 DIAGNOSIS — R296 Repeated falls: Secondary | ICD-10-CM | POA: Diagnosis present

## 2020-04-01 DIAGNOSIS — R7401 Elevation of levels of liver transaminase levels: Secondary | ICD-10-CM

## 2020-04-01 DIAGNOSIS — D696 Thrombocytopenia, unspecified: Secondary | ICD-10-CM | POA: Diagnosis present

## 2020-04-01 DIAGNOSIS — Z419 Encounter for procedure for purposes other than remedying health state, unspecified: Secondary | ICD-10-CM

## 2020-04-01 DIAGNOSIS — F329 Major depressive disorder, single episode, unspecified: Secondary | ICD-10-CM | POA: Diagnosis present

## 2020-04-01 DIAGNOSIS — M25461 Effusion, right knee: Secondary | ICD-10-CM

## 2020-04-01 DIAGNOSIS — F17200 Nicotine dependence, unspecified, uncomplicated: Secondary | ICD-10-CM | POA: Diagnosis present

## 2020-04-01 DIAGNOSIS — S72141A Displaced intertrochanteric fracture of right femur, initial encounter for closed fracture: Principal | ICD-10-CM | POA: Diagnosis present

## 2020-04-01 DIAGNOSIS — W19XXXA Unspecified fall, initial encounter: Secondary | ICD-10-CM

## 2020-04-01 DIAGNOSIS — R52 Pain, unspecified: Secondary | ICD-10-CM

## 2020-04-01 DIAGNOSIS — E559 Vitamin D deficiency, unspecified: Secondary | ICD-10-CM | POA: Diagnosis present

## 2020-04-01 DIAGNOSIS — N179 Acute kidney failure, unspecified: Secondary | ICD-10-CM | POA: Diagnosis present

## 2020-04-01 DIAGNOSIS — D649 Anemia, unspecified: Secondary | ICD-10-CM | POA: Diagnosis present

## 2020-04-01 DIAGNOSIS — D72829 Elevated white blood cell count, unspecified: Secondary | ICD-10-CM | POA: Diagnosis present

## 2020-04-01 DIAGNOSIS — G629 Polyneuropathy, unspecified: Secondary | ICD-10-CM | POA: Diagnosis present

## 2020-04-01 DIAGNOSIS — D638 Anemia in other chronic diseases classified elsewhere: Secondary | ICD-10-CM | POA: Diagnosis present

## 2020-04-01 DIAGNOSIS — M6282 Rhabdomyolysis: Secondary | ICD-10-CM | POA: Diagnosis present

## 2020-04-01 DIAGNOSIS — W010XXA Fall on same level from slipping, tripping and stumbling without subsequent striking against object, initial encounter: Secondary | ICD-10-CM | POA: Diagnosis present

## 2020-04-01 DIAGNOSIS — E860 Dehydration: Secondary | ICD-10-CM | POA: Diagnosis present

## 2020-04-01 DIAGNOSIS — E871 Hypo-osmolality and hyponatremia: Secondary | ICD-10-CM | POA: Diagnosis present

## 2020-04-01 LAB — RETICULOCYTES
Immature Retic Fract: 42.9 % — ABNORMAL HIGH (ref 2.3–15.9)
RBC.: 2.16 MIL/uL — ABNORMAL LOW (ref 4.22–5.81)
Retic Count, Absolute: 88.6 10*3/uL (ref 19.0–186.0)
Retic Ct Pct: 4.1 % — ABNORMAL HIGH (ref 0.4–3.1)

## 2020-04-01 LAB — COMPREHENSIVE METABOLIC PANEL
ALT: 28 U/L (ref 0–44)
AST: 61 U/L — ABNORMAL HIGH (ref 15–41)
Albumin: 3.9 g/dL (ref 3.5–5.0)
Alkaline Phosphatase: 140 U/L — ABNORMAL HIGH (ref 38–126)
Anion gap: 14 (ref 5–15)
BUN: 15 mg/dL (ref 6–20)
CO2: 22 mmol/L (ref 22–32)
Calcium: 8.3 mg/dL — ABNORMAL LOW (ref 8.9–10.3)
Chloride: 89 mmol/L — ABNORMAL LOW (ref 98–111)
Creatinine, Ser: 2.4 mg/dL — ABNORMAL HIGH (ref 0.61–1.24)
GFR calc Af Amer: 33 mL/min — ABNORMAL LOW (ref >60)
GFR calc non Af Amer: 29 mL/min — ABNORMAL LOW (ref >60)
Glucose, Bld: 103 mg/dL — ABNORMAL HIGH (ref 70–99)
Potassium: 4.7 mmol/L (ref 3.5–5.1)
Sodium: 125 mmol/L — ABNORMAL LOW (ref 135–145)
Total Bilirubin: 1.4 mg/dL — ABNORMAL HIGH (ref 0.3–1.2)
Total Protein: 7.1 g/dL (ref 6.5–8.1)

## 2020-04-01 LAB — CBC WITH DIFFERENTIAL/PLATELET
Abs Immature Granulocytes: 0.27 10*3/uL — ABNORMAL HIGH (ref 0.00–0.07)
Basophils Absolute: 0 10*3/uL (ref 0.0–0.1)
Basophils Relative: 0 %
Eosinophils Absolute: 0 10*3/uL (ref 0.0–0.5)
Eosinophils Relative: 0 %
HCT: 20.9 % — ABNORMAL LOW (ref 39.0–52.0)
Hemoglobin: 7.5 g/dL — ABNORMAL LOW (ref 13.0–17.0)
Immature Granulocytes: 2 %
Lymphocytes Relative: 9 %
Lymphs Abs: 1.4 10*3/uL (ref 0.7–4.0)
MCH: 29.8 pg (ref 26.0–34.0)
MCHC: 35.9 g/dL (ref 30.0–36.0)
MCV: 82.9 fL (ref 80.0–100.0)
Monocytes Absolute: 1.6 10*3/uL — ABNORMAL HIGH (ref 0.1–1.0)
Monocytes Relative: 10 %
Neutro Abs: 12.4 10*3/uL — ABNORMAL HIGH (ref 1.7–7.7)
Neutrophils Relative %: 79 %
Platelets: 159 10*3/uL (ref 150–400)
RBC: 2.52 MIL/uL — ABNORMAL LOW (ref 4.22–5.81)
RDW: 13 % (ref 11.5–15.5)
WBC: 15.8 10*3/uL — ABNORMAL HIGH (ref 4.0–10.5)
nRBC: 0.2 % (ref 0.0–0.2)

## 2020-04-01 LAB — APTT: aPTT: 31 seconds (ref 24–36)

## 2020-04-01 LAB — OSMOLALITY: Osmolality: 265 mOsm/kg — ABNORMAL LOW (ref 275–295)

## 2020-04-01 LAB — PREPARE RBC (CROSSMATCH)

## 2020-04-01 LAB — SARS CORONAVIRUS 2 BY RT PCR (HOSPITAL ORDER, PERFORMED IN ~~LOC~~ HOSPITAL LAB): SARS Coronavirus 2: NEGATIVE

## 2020-04-01 LAB — PROTIME-INR
INR: 1.2 (ref 0.8–1.2)
Prothrombin Time: 14.7 seconds (ref 11.4–15.2)

## 2020-04-01 LAB — LACTIC ACID, PLASMA
Lactic Acid, Venous: 1.9 mmol/L (ref 0.5–1.9)
Lactic Acid, Venous: 1.9 mmol/L (ref 0.5–1.9)

## 2020-04-01 LAB — PHOSPHORUS: Phosphorus: 4.7 mg/dL — ABNORMAL HIGH (ref 2.5–4.6)

## 2020-04-01 LAB — ABO/RH: ABO/RH(D): O POS

## 2020-04-01 LAB — CK: Total CK: 666 U/L — ABNORMAL HIGH (ref 49–397)

## 2020-04-01 LAB — MAGNESIUM: Magnesium: 1.6 mg/dL — ABNORMAL LOW (ref 1.7–2.4)

## 2020-04-01 MED ORDER — SODIUM CHLORIDE 0.9 % IV BOLUS
1000.0000 mL | Freq: Once | INTRAVENOUS | Status: AC
  Administered 2020-04-01: 1000 mL via INTRAVENOUS

## 2020-04-01 MED ORDER — METHOCARBAMOL 500 MG PO TABS
500.0000 mg | ORAL_TABLET | Freq: Four times a day (QID) | ORAL | Status: DC | PRN
  Filled 2020-04-01: qty 1

## 2020-04-01 MED ORDER — SODIUM CHLORIDE 0.9 % IV SOLN: INTRAVENOUS | Status: DC

## 2020-04-01 MED ORDER — MORPHINE SULFATE (PF) 2 MG/ML IV SOLN: 0.5000 mg | INTRAVENOUS | Status: DC | PRN

## 2020-04-01 MED ORDER — HYDROCODONE-ACETAMINOPHEN 5-325 MG PO TABS
1.0000 | ORAL_TABLET | Freq: Four times a day (QID) | ORAL | Status: DC | PRN
  Administered 2020-04-02: 2 via ORAL
  Filled 2020-04-01: qty 2

## 2020-04-01 MED ORDER — CEFAZOLIN SODIUM-DEXTROSE 2-4 GM/100ML-% IV SOLN
2.0000 g | INTRAVENOUS | Status: AC
  Administered 2020-04-02: 2 g via INTRAVENOUS
  Filled 2020-04-01: qty 100

## 2020-04-01 MED ORDER — SODIUM CHLORIDE 0.9% IV SOLUTION
Freq: Once | INTRAVENOUS | Status: DC
  Filled 2020-04-01: qty 250

## 2020-04-01 MED ORDER — METHOCARBAMOL 1000 MG/10ML IJ SOLN
500.0000 mg | Freq: Four times a day (QID) | INTRAVENOUS | Status: DC | PRN
  Filled 2020-04-01 (×2): qty 5

## 2020-04-01 NOTE — H&P (Signed)
Ryan [name] VCB:[id number] DOB: 01-Oct-1961 DOA: 04/01/2020     PCP: Patient, No Pcp Per   Outpatient Specialists:   NONE   Patient arrived to ER on 04/01/20 at 1654 Referred by Attending Earleen Newport, MD   Patient coming from: home Lives alone,   Chief Complaint:   Chief Complaint  Patient presents with  . Leg Pain    HPI: Ryan [name] is a 59 y.o. male with medical history significant of elevated LFTs anemia and depression    Presented with   reports of fall 3 days ago no LOC patient stated he was on the floor for past 2 days.  EMS did not notice any stool or urine around the patient. HE reports he was using a bucket to urinate.  Vital signs were stable on arrival.Patient stated that he tripped it was a mechanical fall but he has hx of vertigo in the past and does fall frequently.  Patient reported right leg pain patient states that he has had significant discomfort and could not eat or dry for the past 3 days his leg was swollen initially but now swelling has decreased patient denies any black stools or melena no bleeding from any place.  Denies ever needing blood transfusion states he has never heard that he was anemic and no family history of cancers or anemia.  He reports he used to drink a bit but have not drank anything for the past 6 months denies smoking.  He reports that he does fall frequently.  Patient denies any cardiac history reports he is able to walk without shortness of breath or chest pain his baseline able to walk up a flight of stairs without any symptoms.  Per review of records his hemoglobin 8 years ago was 10.7At that time folate was normal.  His anemia panel was consistent with anemia of chronic disease elevated ferritin up to 742  At about the same time his LFTs were slightly elevated his hepatitis work-up was negative  Infectious risk factors:  Reports none     Initial COVID TEST  NEGATIVE   Lab Results  Component Value Date   [place]  NEGATIVE 04/01/2020     While in ER: Noted to have tachycardia up to 135 Chest x-ray showing 10th and ninth rib fractures Hip imaging showed atrial intertrochanteric fracture on the right of the hip  ER Provider Called: Orthopedics    Dr. Paulino Door They Recommend admit to medicine NPO after midnight Will see in AM   Hospitalist was called for admission for right hip fracture  The following Work up has been ordered so far:  Orders Placed This Encounter  Procedures  . SARS Coronavirus 2 by RT PCR (hospital order, performed in [facility] hospital lab) Nasopharyngeal Nasopharyngeal Swab  . DG Tibia/Fibula Right Port  . DG Hip Unilat W or Wo Pelvis 2-3 Views Right  . DG Chest 1 View  . CT Head Wo Contrast  . CT Cervical Spine Wo Contrast  . CBC with Differential  . Comprehensive metabolic panel  . CK  . Lactic acid, plasma  . Urinalysis, Complete w Microscopic  . Diet NPO time specified  . Diet NPO time specified Except for: Sips with Meds  . Apply ice to affected area (if injury is <48 hours old)  . Immobilize affected extremity  . Remove jewelry  . Informed Consent Details: Physician/Practitioner Attestation; Transcribe to consent form and obtain patient signature  . Informed Consent Details: Physician/Practitioner Attestation; Transcribe to  consent form and obtain patient signature  . Pulse checks  . Consult to hospitalist  ALL PATIENTS BEING ADMITTED/HAVING PROCEDURES NEED COVID-19 SCREENING  . ED EKG  . Type and screen [place]  . Prepare RBC (crossmatch)  . ABO/Rh  . Insert peripheral IV    Following Medications were ordered in ER: Medications  ceFAZolin (ANCEF) IVPB 2g/100 mL premix (has no administration in time range)  0.9 %  sodium chloride infusion (Manually program via Guardrails IV Fluids) (has no administration in time range)  sodium chloride 0.9 % bolus 1,000 mL (1,000 mLs Intravenous New Bag/Given 04/01/20 2142)        Consult Orders   (From admission, onward)         Start     Ordered   04/01/20 2125  Consult to hospitalist  ALL PATIENTS BEING ADMITTED/HAVING PROCEDURES NEED COVID-19 SCREENING  Once       Comments: ALL PATIENTS BEING ADMITTED/HAVING PROCEDURES NEED COVID-19 SCREENING  Provider:  (Not yet assigned)  Question Answer Comment  Place call to: [phone]   Reason for Consult Admit   Diagnosis/Clinical Info for Consult: Hip fracture, symptomatic anemia, hyponatremia, weakness      04/01/20 2125          Significant initial  Findings: Abnormal Labs Reviewed  CBC WITH DIFFERENTIAL/PLATELET - Abnormal; Notable for the following components:      Result Value   WBC 15.8 (*)    RBC 2.52 (*)    Hemoglobin 7.5 (*)    HCT 20.9 (*)    Neutro Abs 12.4 (*)    Monocytes Absolute 1.6 (*)    Abs Immature Granulocytes 0.27 (*)    All other components within normal limits  COMPREHENSIVE METABOLIC PANEL - Abnormal; Notable for the following components:   Sodium 125 (*)    Chloride 89 (*)    Glucose, Bld 103 (*)    Creatinine, Ser 2.40 (*)    Calcium 8.3 (*)    AST 61 (*)    Alkaline Phosphatase 140 (*)    Total Bilirubin 1.4 (*)    GFR calc non Af Amer 29 (*)    GFR calc Af Amer 33 (*)    All other components within normal limits  CK - Abnormal; Notable for the following components:   Total CK 666 (*)    All other components within normal limits    Otherwise labs showing:    Recent Labs  Lab 04/01/20 2013  NA 125*  K 4.7  CO2 22  GLUCOSE 103*  BUN 15  CREATININE 2.40*  CALCIUM 8.3*    Cr    Up from baseline see below Lab Results  Component Value Date   CREATININE 2.40 (H) 04/01/2020   CREATININE 1.30 09/14/2013    Recent Labs  Lab 04/01/20 2013  AST 61*  ALT 28  ALKPHOS 140*  BILITOT 1.4*  PROT 7.1  ALBUMIN 3.9   Lab Results  Component Value Date   CALCIUM 8.3 (L) 04/01/2020     WBC      Component Value Date/Time   WBC 15.8 (H) 04/01/2020 2013   ANC    Component  Value Date/Time   NEUTROABS 12.4 (H) 04/01/2020 2013   ALC No components found for: LYMPHAB    Plt: Lab Results  Component Value Date   PLT 159 04/01/2020     HG/HCT Down  from baseline see below    Component Value Date/Time   HGB 7.5 (L) 04/01/2020  2013   HCT 20.9 (L) 04/01/2020 2013    No results for input(s): LIPASE, AMYLASE in the last 168 hours. No results for input(s): AMMONIA in the last 168 hours.    Cardiac Panel (last 3 results) Recent Labs    04/01/20 2013  CKTOTAL 666*     ECG: Ordered Personally reviewed by me showing: HR : 118 Rhythm:  Sinus tachycardia   no evidence of ischemic changes QTC 445    UA   ordered      Ordered  CT HEAD   NON acute  CXR -  NON acute  Right hip Fracture    ED Triage Vitals  Enc Vitals Group     BP 04/01/20 1708 (!) 132/99     Pulse Rate 04/01/20 1708 (!) 135     Resp 04/01/20 1708 18     Temp 04/01/20 1708 97.8 F (36.6 C)     Temp Source 04/01/20 1708 Oral     SpO2 04/01/20 1708 100 %     Weight 04/01/20 1709 150 lb (68 kg)     Height 04/01/20 1709 6\' 5"  (1.956 m)     Head Circumference --      Peak Flow --      Pain Score 04/01/20 1709 10     Pain Loc --      Pain Edu? --      Excl. in [place]? --   TMAX(24)@       Latest  Blood pressure 96/69, pulse (!) 122, temperature 97.8 F (36.6 C), temperature source Oral, resp. rate 20, height 6\' 5"  (1.956 m), weight 68 kg, SpO2 100 %.    Review of Systems:    Pertinent positives include:  fatigue, falls, right hip pain  Constitutional:  No weight loss, night sweats, Fevers, chills, weight loss  HEENT:  No headaches, Difficulty swallowing,Tooth/dental problems,Sore throat,  No sneezing, itching, ear ache, nasal congestion, post nasal drip,  Cardio-vascular:  No chest pain, Orthopnea, PND, anasarca, dizziness, palpitations.no Bilateral lower extremity swelling  GI:  No heartburn, indigestion, abdominal pain, nausea, vomiting, diarrhea, change in bowel  habits, loss of appetite, melena, blood in stool, hematemesis Resp:  no shortness of breath at rest. No dyspnea on exertion, No excess mucus, no productive cough, No non-productive cough, No coughing up of blood.No change in color of mucus.No wheezing. Skin:  no rash or lesions. No jaundice GU:  no dysuria, change in color of urine, no urgency or frequency. No straining to urinate.  No flank pain.  Musculoskeletal:  No joint pain or no joint swelling. No decreased range of motion. No back pain.  Psych:  No change in mood or affect. No depression or anxiety. No memory loss.  Neuro: no localizing neurological complaints, no tingling, no weakness, no double vision, no gait abnormality, no slurred speech, no confusion  All systems reviewed and apart from [place] all are negative  Past Medical History:   Past Medical History:  Diagnosis Date  . Adenomatous colon polyp   . Anemia   . Depression   . Elevated LFTs       No past surgical history on file.  Social History:  Ambulatory  Independently      reports that he has been smoking. He has never used smokeless tobacco. He reports current alcohol use. He reports that he does not use drugs.     Family History:   Family History  Problem Relation Age of Onset  . Heart disease Other   .  Colon polyps Cousin        maternal  . Colon polyps Cousin   . Colon polyps Maternal Aunt   . Colon cancer Maternal Grandfather     Allergies: No Known Allergies   Prior to Admission medications   Medication Sig Start Date End Date Taking? Authorizing Provider  bacitracin ointment Apply 1 application topically 2 (two) times daily. 10/22/14   [address], [place], [place]   Physical Exam: Vitals with BMI 04/01/2020 04/01/2020 04/01/2020  Height - - -  Weight - - -  BMI - - -  Systolic 95 361 96  Diastolic 72 443 69  Pulse 99 102 122     1. General:  in No Acute distress    Chronically ill  -appearing 2. Psychological: Alert and   Oriented  to self and situation 3. Head/ENT:     Dry Mucous Membranes pale                          Head Non traumatic, neck supple                           Poor Dentition 4. SKIN:  decreased Skin turgor,  Skin clean Dry and intact no rash 5. Heart: Regular rate and rhythm no  Murmur, no Rub or gallop 6. Lungs:  no wheezes or crackles   7. Abdomen: Soft,  non-tender, Non distended thin bowel sounds present 8. Lower extremities: no clubbing, cyanosis, no  edema 9. Neurologically Grossly intact, moving all 4 extremities equally  10. MSK: Normal range of motion limited in Right hip 11 Hemoccult negative  All other LABS:     Recent Labs  Lab 04/01/20 2013  WBC 15.8*  NEUTROABS 12.4*  HGB 7.5*  HCT 20.9*  MCV 82.9  PLT 159     Recent Labs  Lab 04/01/20 2013  NA 125*  K 4.7  CL 89*  CO2 22  GLUCOSE 103*  BUN 15  CREATININE 2.40*  CALCIUM 8.3*     Recent Labs  Lab 04/01/20 2013  AST 61*  ALT 28  ALKPHOS 140*  BILITOT 1.4*  PROT 7.1  ALBUMIN 3.9       Cultures:    Component Value Date/Time   SDES ABSCESS TOE LEFT 08/14/2013 1355   SPECREQUEST NONE 08/14/2013 1355   CULT  08/14/2013 1355    NO GROWTH 3 DAYS Performed at [place] 08/18/2013 FINAL 08/14/2013 1355     Radiological Exams on Admission: DG Chest 1 View  Result Date: 04/01/2020 CLINICAL DATA:  Right leg pain. Preoperative evaluation. EXAM: CHEST  1 VIEW COMPARISON:  None. FINDINGS: The lungs are hyperinflated. There is no evidence of acute infiltrate, pleural effusion or pneumothorax. The heart size and mediastinal contours are within normal limits. Ninth and tenth right rib fractures are seen. These are of indeterminate age. IMPRESSION: 1. No acute cardiopulmonary disease. 2. Ninth and tenth right rib fractures of indeterminate age. Electronically Signed   By: Virgina Norfolk M.D.   On: 04/01/2020 19:15   CT Head Wo Contrast  Result Date: 04/01/2020 CLINICAL DATA:  Status post  fall. EXAM: CT HEAD WITHOUT CONTRAST TECHNIQUE: Contiguous axial images were obtained from the base of the skull through the vertex without intravenous contrast. COMPARISON:  None. FINDINGS: Brain: There is mild cerebral atrophy with widening of the extra-axial spaces and ventricular dilatation. There are areas of decreased  attenuation within the white matter tracts of the supratentorial brain, consistent with microvascular disease changes. Vascular: No hyperdense vessel or unexpected calcification. Skull: Normal. Negative for fracture or focal lesion. Sinuses/Orbits: A chronic deformity is seen involving the right maxillary sinus. Other: None. IMPRESSION: 1. Generalized cerebral atrophy. 2. No acute intracranial abnormality. Electronically Signed   By: Virgina Norfolk M.D.   On: 04/01/2020 20:00   CT Cervical Spine Wo Contrast  Result Date: 04/01/2020 CLINICAL DATA:  Status post fall. EXAM: CT CERVICAL SPINE WITHOUT CONTRAST TECHNIQUE: Multidetector CT imaging of the cervical spine was performed without intravenous contrast. Multiplanar CT image reconstructions were also generated. COMPARISON:  None. FINDINGS: Alignment: There is approximately 2 mm retrolisthesis of the C3 vertebral body on C4. 2 mm retrolisthesis of the C5 vertebral body on C6 is also seen. Skull base and vertebrae: No acute fracture. No primary bone lesion or focal pathologic process. Soft tissues and spinal canal: No prevertebral fluid or swelling. No visible canal hematoma. Disc levels: Moderate to marked severity endplate sclerosis is seen at the levels of C3-C4, C4-C5 and C5-C6. Mild to moderate severity endplate sclerosis is noted at the level of C6-C7. Marked severity intervertebral disc space narrowing is seen at the levels of C3-C4, C4-C5, C5-C6 and C6-C7. Mild to moderate severity bilateral multilevel facet joint hypertrophy is seen. Upper chest: There is mild biapical scarring and/or atelectasis. Other: None. IMPRESSION: 1. No  acute fracture within the cervical spine. 2. Marked severity multilevel degenerative changes, most prominent at the levels of C3-C4, C4-C5, C5-C6 and C6-C7. 3. 2 mm retrolisthesis of the C3 vertebral body on C4 and C5 vertebral body on C6. Electronically Signed   By: Virgina Norfolk M.D.   On: 04/01/2020 20:06   DG Tibia/Fibula Right Port  Result Date: 04/01/2020 CLINICAL DATA:  Right leg pain and swelling. EXAM: PORTABLE RIGHT TIBIA AND FIBULA - 2 VIEW COMPARISON:  November 08, 2014 FINDINGS: There is no evidence of an acute fracture or dislocation. A chronic fracture of the right lateral malleolus is noted. Soft tissues are unremarkable. IMPRESSION: No acute osseous abnormality. Electronically Signed   By: Virgina Norfolk M.D.   On: 04/01/2020 18:36   DG Hip Unilat W or Wo Pelvis 2-3 Views Right  Result Date: 04/01/2020 CLINICAL DATA:  Status post fall. EXAM: DG HIP (WITH OR WITHOUT PELVIS) 2-3V RIGHT COMPARISON:  None. FINDINGS: Acute inter trochanteric fracture is seen involving the proximal right femur. There is no evidence of dislocation. There is no evidence of arthropathy or other focal bone abnormality. IMPRESSION: Acute intertrochanteric fracture of the proximal right femur. Electronically Signed   By: Virgina Norfolk M.D.   On: 04/01/2020 19:14    Chart has been reviewed    Assessment/Plan  59 y.o. male with medical history significant of elevated LFTs anemia and depression    Admitted for right hip fracture dehydration hyponatremia and symptomatic anemia  Present on Admission: . Closed right hip fracture ([place]) -  - management as per orthopedics,  plan to operate  in  a.m.    Keep nothing by mouth post midnight. Patient  not on anticoagulation or antiplatelet agents   Ordered type and screen,  , order a vitamin D level  Patient at baseline  able to walk a flight of stairs or 100 feet     Patient denies any chest pain or shortness of breath currently and/or with  exertion,  ECG showing no evidence of acute ischemia  no known history of coronary  artery disease,  COPD  Liver failure (has chronic  mild elevated LFTs) CKD  Given multiple abnormalities such as AKI hyponatremia and symptomatic anemia patient is at least moderate  risk   which has been discussed with family but at this point no furthther cardiac workup is indicated.    . Symptomatic anemia -unclear etiology but appears to be chronic anemia of chronic disease.  Hemoccult negative in emergency department done by myself.  Obtain anemia panel unfortunate patient already got blood in the emergency department prior to me seeing him.  Will attempt to add on anemia panel. Given chronic nature of anemia that has been worsening over past few years patient would benefit from further evaluation by hematology.  Hemoccult negative making GI blood loss less likely.  Given frequent fractures chronic anemia renal insufficiency will order serum electrophoresis panel  . Hyponatremia -obtain urine electrolytes most likely secondary to dehydration we will gently rehydrate and follow sodium levels  . Closed rib fracture -unclear what occurred first.  Patient is frequent falls will need PT OT prior to discharge.  Will order incentive spirometry supportive management   . Leukocytosis -possibly stress related no evidence of infectious at this time continue to monitor   . Rhabdomyolysis -mild will   rehydrate and recheck in a.m. Monitor electrolytes  . Dehydration -will rehydrate and follow.  History of elevated LFTs those are chronic.  Mildly elevated in the past have undergone hepatitis testing which was negative 8 years ago   Other plan as per orders.  DVT prophylaxis:  SCD      Code Status:    Code Status: Full Code  as per patient   I had personally discussed CODE STATUS with patient      Family Communication:   Family not at  Bedside    Disposition Plan:    likely will need placement for  rehabilitation                          Following barriers for discharge:                            Electrolytes corrected                               Anemia corrected                             Pain controlled with PO medications                                 white count improving                               Will need to be able to tolerate PO                                                       Will need consultants to evaluate patient prior to discharge  Would benefit from PT/OT eval prior to DC                                       Transition of care consulted                   Nutrition    consulted                                   Consults called: Orthopedics  Admission status:  ED Disposition    ED Disposition Condition [place]: [place] [[id number]]  Level of Care: Med-Surg [16]  Covid Evaluation: Confirmed COVID Negative  Admission Type: Urgent [2]  Diagnosis: Closed right hip fracture [facility]) [[id number]]  Admitting Physician: Toy Baker [[id number]]  Attending Physician: Toy Baker [[id number]]  Estimated length of stay: past midnight tomorrow  Certification:: I certify this patient will need inpatient services for at least 2 midnights          inpatient     I Expect 2 midnight stay secondary to severity of patient's current illness need for inpatient interventions justified by the following:  hemodynamic instability despite optimal treatment (tachycardia     Severe lab/radiological/exam abnormalities including: Right hip fracture   and extensive comorbidities including:    iver disease .    That are currently affecting medical management.   I expect  patient to be hospitalized for 2 midnights requiring inpatient medical care.  Patient is at high risk for adverse outcome (such as loss of life or disability) if not treated.  Indication for inpatient stay as follows:    Need for  operative/procedural  intervention   Need for   IV fluids,   IV pain medications,     Level of care     tele  For 12H   COVID-19 Labs      Precautions: admitted as Covid Negative     PPE: Used by the provider:   N95  eye Goggles,  Gloves     Navraj Dreibelbis 04/01/2020, 11:35 PM    [facility]     after 2 AM please page floor coverage PA If 7AM-7PM, please contact the day team taking care of the patient using [email or web address]   Patient was evaluated in the context of the global COVID-19 pandemic, which necessitated consideration that the patient might be at risk for infection with the SARS-CoV-2 virus that causes COVID-19. Institutional protocols and algorithms that pertain to the evaluation of patients at risk for COVID-19 are in a state of rapid change based on information released by regulatory bodies including the CDC and federal and state organizations. These policies and algorithms were followed during the patient's care.

## 2020-04-01 NOTE — ED Triage Notes (Signed)
First nurse note- pt fell Monday per EMS.  No LOC. Per pt he was laying in floor since Monday; no stool or urine in pt per EMS.  VSS with EMS

## 2020-04-01 NOTE — ED Notes (Signed)
Attempted IV access no success

## 2020-04-01 NOTE — ED Triage Notes (Signed)
Pt here with right leg pain that started on Monday. Pt states that the swelling has went down but the pain is still there and he has not been able to eat or drive in 3 days due to the pain.

## 2020-04-01 NOTE — ED Provider Notes (Signed)
Emergency Department Provider Note  ____________________________________________  Time seen: Approximately 7:37 PM  I have reviewed the triage vital signs and the nursing notes.   HISTORY  Chief Complaint Leg Pain   Historian Patient   HPI Ryan [name] is a 59 y.o. male presents to the emergency department after patient states that he had a mechanical, nonsyncopal fall 3 days ago.  Patient states that he tripped in his house and states that he felt immediate leg pain.  He states that he was not able to get up and states that he was lying on his bathroom floor for 3 days.  He states that he has not had any anything to eat or drink during that time.  Patient called EMS today.   Past Medical History:  Diagnosis Date  . Adenomatous colon polyp   . Anemia   . Depression   . Elevated LFTs      Immunizations up to date:  Yes.     Past Medical History:  Diagnosis Date  . Adenomatous colon polyp   . Anemia   . Depression   . Elevated LFTs     Patient Active Problem List   Diagnosis Date Noted  . Closed right hip fracture ([place]) 04/01/2020  . Symptomatic anemia 04/01/2020  . Hyponatremia 04/01/2020    No past surgical history on file.  Prior to Admission medications   Medication Sig Start Date End Date Taking? Authorizing Provider  bacitracin ointment Apply 1 application topically 2 (two) times daily. 10/22/14   Street, Boon, PA-C    Allergies Patient has no known allergies.  Family History  Problem Relation Age of Onset  . Heart disease Other   . Colon polyps Cousin        maternal  . Colon polyps Cousin   . Colon polyps Maternal Aunt   . Colon cancer Maternal Grandfather     Social History Social History   Tobacco Use  . Smoking status: Current Some Day Smoker  . Smokeless tobacco: Never Used  Substance Use Topics  . Alcohol use: Yes    Comment: 4 beers weekly  . Drug use: No     Review of Systems  Constitutional: No fever/chills Eyes:   No discharge ENT: No upper respiratory complaints. Respiratory: no cough. No SOB/ use of accessory muscles to breath Gastrointestinal:   No nausea, no vomiting.  No diarrhea.  No constipation. Musculoskeletal: Patient has right hip pain.  Skin: Negative for rash, abrasions, lacerations, ecchymosis.   ____________________________________________   PHYSICAL EXAM:  VITAL SIGNS: ED Triage Vitals  Enc Vitals Group     BP 04/01/20 1708 (!) 132/99     Pulse Rate 04/01/20 1708 (!) 135     Resp 04/01/20 1708 18     Temp 04/01/20 1708 97.8 F (36.6 C)     Temp Source 04/01/20 1708 Oral     SpO2 04/01/20 1708 100 %     Weight 04/01/20 1709 150 lb (68 kg)     Height 04/01/20 1709 6\' 5"  (1.956 m)     Head Circumference --      Peak Flow --      Pain Score 04/01/20 1709 10     Pain Loc --      Pain Edu? --      Excl. in [place]? --      Constitutional: Alert and oriented. Well appearing and in no acute distress. Eyes: Conjunctivae are normal. PERRL. EOMI. Head: Atraumatic. ENT:  Nose: No congestion/rhinnorhea.      Mouth/Throat: Mucous membranes are moist.  Neck: No stridor. FROM.  Cardiovascular: Normal rate, regular rhythm. Normal S1 and S2.  Good peripheral circulation. Respiratory: Normal respiratory effort without tachypnea or retractions. Lungs CTAB. Good air entry to the bases with no decreased or absent breath sounds Gastrointestinal: Bowel sounds x 4 quadrants. Soft and nontender to palpation. No guarding or rigidity. No distention. Musculoskeletal: Patient has significant pain with internal and external rotation of the right hip. Neurologic:  Normal for age. No gross focal neurologic deficits are appreciated.  Skin:  Skin is warm, dry and intact. No rash noted. Psychiatric: Mood and affect are normal for age. Speech and behavior are normal.   ____________________________________________   LABS (all labs ordered are listed, but only abnormal results are  displayed)  Labs Reviewed  CBC WITH DIFFERENTIAL/PLATELET - Abnormal; Notable for the following components:      Result Value   WBC 15.8 (*)    RBC 2.52 (*)    Hemoglobin 7.5 (*)    HCT 20.9 (*)    Neutro Abs 12.4 (*)    Monocytes Absolute 1.6 (*)    Abs Immature Granulocytes 0.27 (*)    All other components within normal limits  COMPREHENSIVE METABOLIC PANEL - Abnormal; Notable for the following components:   Sodium 125 (*)    Chloride 89 (*)    Glucose, Bld 103 (*)    Creatinine, Ser 2.40 (*)    Calcium 8.3 (*)    AST 61 (*)    Alkaline Phosphatase 140 (*)    Total Bilirubin 1.4 (*)    GFR calc non Af Amer 29 (*)    GFR calc Af Amer 33 (*)    All other components within normal limits  CK - Abnormal; Notable for the following components:   Total CK 666 (*)    All other components within normal limits  SARS CORONAVIRUS 2 BY RT PCR (HOSPITAL ORDER, [facility])  LACTIC ACID, PLASMA  LACTIC ACID, PLASMA  URINALYSIS, COMPLETE (UACMP) WITH MICROSCOPIC  PROTIME-INR  APTT  VITAMIN B12  FOLATE  IRON AND TIBC  FERRITIN  RETICULOCYTES  MAGNESIUM  PHOSPHORUS  VITAMIN D 25 HYDROXY (VIT D DEFICIENCY, FRACTURES)  TYPE AND SCREEN  PREPARE RBC (CROSSMATCH)  ABO/RH  TROPONIN I (HIGH SENSITIVITY)   ____________________________________________  EKG   ____________________________________________  RADIOLOGY Unk Pinto, personally viewed and evaluated these images (plain radiographs) as part of my medical decision making, as well as reviewing the written report by the radiologist.  DG Chest 1 View  Result Date: 04/01/2020 CLINICAL DATA:  Right leg pain. Preoperative evaluation. EXAM: CHEST  1 VIEW COMPARISON:  None. FINDINGS: The lungs are hyperinflated. There is no evidence of acute infiltrate, pleural effusion or pneumothorax. The heart size and mediastinal contours are within normal limits. Ninth and tenth right rib fractures are seen. These  are of indeterminate age. IMPRESSION: 1. No acute cardiopulmonary disease. 2. Ninth and tenth right rib fractures of indeterminate age. Electronically Signed   By: Virgina Norfolk M.D.   On: 04/01/2020 19:15   CT Head Wo Contrast  Result Date: 04/01/2020 CLINICAL DATA:  Status post fall. EXAM: CT HEAD WITHOUT CONTRAST TECHNIQUE: Contiguous axial images were obtained from the base of the skull through the vertex without intravenous contrast. COMPARISON:  None. FINDINGS: Brain: There is mild cerebral atrophy with widening of the extra-axial spaces and ventricular dilatation. There are areas of  decreased attenuation within the white matter tracts of the supratentorial brain, consistent with microvascular disease changes. Vascular: No hyperdense vessel or unexpected calcification. Skull: Normal. Negative for fracture or focal lesion. Sinuses/Orbits: A chronic deformity is seen involving the right maxillary sinus. Other: None. IMPRESSION: 1. Generalized cerebral atrophy. 2. No acute intracranial abnormality. Electronically Signed   By: Virgina Norfolk M.D.   On: 04/01/2020 20:00   CT Cervical Spine Wo Contrast  Result Date: 04/01/2020 CLINICAL DATA:  Status post fall. EXAM: CT CERVICAL SPINE WITHOUT CONTRAST TECHNIQUE: Multidetector CT imaging of the cervical spine was performed without intravenous contrast. Multiplanar CT image reconstructions were also generated. COMPARISON:  None. FINDINGS: Alignment: There is approximately 2 mm retrolisthesis of the C3 vertebral body on C4. 2 mm retrolisthesis of the C5 vertebral body on C6 is also seen. Skull base and vertebrae: No acute fracture. No primary bone lesion or focal pathologic process. Soft tissues and spinal canal: No prevertebral fluid or swelling. No visible canal hematoma. Disc levels: Moderate to marked severity endplate sclerosis is seen at the levels of C3-C4, C4-C5 and C5-C6. Mild to moderate severity endplate sclerosis is noted at the level of  C6-C7. Marked severity intervertebral disc space narrowing is seen at the levels of C3-C4, C4-C5, C5-C6 and C6-C7. Mild to moderate severity bilateral multilevel facet joint hypertrophy is seen. Upper chest: There is mild biapical scarring and/or atelectasis. Other: None. IMPRESSION: 1. No acute fracture within the cervical spine. 2. Marked severity multilevel degenerative changes, most prominent at the levels of C3-C4, C4-C5, C5-C6 and C6-C7. 3. 2 mm retrolisthesis of the C3 vertebral body on C4 and C5 vertebral body on C6. Electronically Signed   By: Virgina Norfolk M.D.   On: 04/01/2020 20:06   DG Tibia/Fibula Right Port  Result Date: 04/01/2020 CLINICAL DATA:  Right leg pain and swelling. EXAM: PORTABLE RIGHT TIBIA AND FIBULA - 2 VIEW COMPARISON:  November 08, 2014 FINDINGS: There is no evidence of an acute fracture or dislocation. A chronic fracture of the right lateral malleolus is noted. Soft tissues are unremarkable. IMPRESSION: No acute osseous abnormality. Electronically Signed   By: Virgina Norfolk M.D.   On: 04/01/2020 18:36   DG Hip Unilat W or Wo Pelvis 2-3 Views Right  Result Date: 04/01/2020 CLINICAL DATA:  Status post fall. EXAM: DG HIP (WITH OR WITHOUT PELVIS) 2-3V RIGHT COMPARISON:  None. FINDINGS: Acute inter trochanteric fracture is seen involving the proximal right femur. There is no evidence of dislocation. There is no evidence of arthropathy or other focal bone abnormality. IMPRESSION: Acute intertrochanteric fracture of the proximal right femur. Electronically Signed   By: Virgina Norfolk M.D.   On: 04/01/2020 19:14    ____________________________________________    PROCEDURES  Procedure(s) performed:     Procedures     Medications  ceFAZolin (ANCEF) IVPB 2g/100 mL premix (has no administration in time range)  0.9 %  sodium chloride infusion (Manually program via Guardrails IV Fluids) (has no administration in time range)  sodium chloride 0.9 % bolus 1,000  mL (1,000 mLs Intravenous New Bag/Given 04/01/20 2142)     ____________________________________________   INITIAL IMPRESSION / ASSESSMENT AND PLAN / ED COURSE  Pertinent labs & imaging results that were available during my care of the patient were reviewed by me and considered in my medical decision making (see chart for details).  Clinical Course as of Apr 01 2217  Wed Apr 01, 2020  2158 ABO/Rh [JC]    Clinical Course User Index [  JC] Sydnee Levans, Student-PA     Assessment and Plan:  Right hip fracture Symptomatic anemia Acute kidney injury Hyponatremia  59 year old male presents to the emergency department with acute right hip pain after patient had a mechanical fall in his house and has been bound to the floor of his bathroom for the past 3 days.  Patient was notably tachycardic at triage.  On physical exam he had pain with internal and external rotation of the right hip.  Patient's hemoglobin was 7.5.  Reviewed patient's case with attending, Dr. Joni Fears.  Given persistent tachycardia after fluids, Dr. Joni Fears recommended transfusion.  Patient consent for transfusion was obtained.  Patient was hyponatremic at 125 with concern for acute kidney injury, creatinine 2.4.  CK was elevated at 666.  Patient had leukocytosis with left shift.  X-ray of the right hip reveals a right intertrochanteric fracture.  No pneumothorax on chest x-ray.  [place] on-call, Dr. Roland Rack was consulted who recommended admission and n.p.o. status after midnight.  Hospitalist on-call was consulted, Dr.Doutova who accepted patient for admission.    ____________________________________________  FINAL CLINICAL IMPRESSION(S) / ED DIAGNOSES  Final diagnoses:  Pain      NEW MEDICATIONS STARTED DURING THIS VISIT:  ED Discharge Orders    None          This chart was dictated using voice recognition software/Dragon. Despite best efforts to proofread, errors can occur which can change the  meaning. Any change was purely unintentional.     Karren Cobble 04/01/20 2218    Carrie Mew, MD 04/01/20 2244

## 2020-04-02 ENCOUNTER — Encounter: Payer: Self-pay | Admitting: Internal Medicine

## 2020-04-02 ENCOUNTER — Inpatient Hospital Stay: Payer: Self-pay | Admitting: Anesthesiology

## 2020-04-02 ENCOUNTER — Inpatient Hospital Stay: Payer: Self-pay

## 2020-04-02 ENCOUNTER — Encounter
Admission: AD | Disposition: A | Discharge status: Home Health | Payer: Self-pay | Source: Home / Self Care | Attending: Internal Medicine

## 2020-04-02 DIAGNOSIS — R52 Pain, unspecified: Secondary | ICD-10-CM

## 2020-04-02 DIAGNOSIS — E871 Hypo-osmolality and hyponatremia: Secondary | ICD-10-CM

## 2020-04-02 DIAGNOSIS — S2249XA Multiple fractures of ribs, unspecified side, initial encounter for closed fracture: Secondary | ICD-10-CM

## 2020-04-02 DIAGNOSIS — D72829 Elevated white blood cell count, unspecified: Secondary | ICD-10-CM

## 2020-04-02 DIAGNOSIS — M6282 Rhabdomyolysis: Secondary | ICD-10-CM

## 2020-04-02 DIAGNOSIS — D649 Anemia, unspecified: Secondary | ICD-10-CM

## 2020-04-02 DIAGNOSIS — E860 Dehydration: Secondary | ICD-10-CM

## 2020-04-02 HISTORY — PX: INTRAMEDULLARY (IM) NAIL INTERTROCHANTERIC: SHX5875

## 2020-04-02 LAB — CBC
HCT: 21.8 % — ABNORMAL LOW (ref 39.0–52.0)
HCT: 19 % — ABNORMAL LOW (ref 39.0–52.0)
Hemoglobin: 7.6 g/dL — ABNORMAL LOW (ref 13.0–17.0)
Hemoglobin: 6.6 g/dL — ABNORMAL LOW (ref 13.0–17.0)
MCH: 28.1 pg (ref 26.0–34.0)
MCH: 28.6 pg (ref 26.0–34.0)
MCHC: 34.9 g/dL (ref 30.0–36.0)
MCHC: 34.7 g/dL (ref 30.0–36.0)
MCV: 80.7 fL (ref 80.0–100.0)
MCV: 82.3 fL (ref 80.0–100.0)
Platelets: 137 10*3/uL — ABNORMAL LOW (ref 150–400)
Platelets: 120 10*3/uL — ABNORMAL LOW (ref 150–400)
RBC: 2.7 MIL/uL — ABNORMAL LOW (ref 4.22–5.81)
RBC: 2.31 MIL/uL — ABNORMAL LOW (ref 4.22–5.81)
RDW: 13.2 % (ref 11.5–15.5)
RDW: 13.6 % (ref 11.5–15.5)
WBC: 13.2 10*3/uL — ABNORMAL HIGH (ref 4.0–10.5)
WBC: 13.8 10*3/uL — ABNORMAL HIGH (ref 4.0–10.5)
nRBC: 0 % (ref 0.0–0.2)
nRBC: 0 % (ref 0.0–0.2)

## 2020-04-02 LAB — URINALYSIS, COMPLETE (UACMP) WITH MICROSCOPIC
Bilirubin Urine: NEGATIVE
Glucose, UA: NEGATIVE mg/dL
Hgb urine dipstick: NEGATIVE
Ketones, ur: 5 mg/dL — AB
Leukocytes,Ua: NEGATIVE
Nitrite: NEGATIVE
Protein, ur: NEGATIVE mg/dL
Specific Gravity, Urine: 1.012 (ref 1.005–1.030)
pH: 5 (ref 5.0–8.0)

## 2020-04-02 LAB — FOLATE: Folate: 14.4 ng/mL (ref >5.9)

## 2020-04-02 LAB — BASIC METABOLIC PANEL
Anion gap: 11 (ref 5–15)
BUN: 15 mg/dL (ref 6–20)
CO2: 22 mmol/L (ref 22–32)
Calcium: 7.8 mg/dL — ABNORMAL LOW (ref 8.9–10.3)
Chloride: 95 mmol/L — ABNORMAL LOW (ref 98–111)
Creatinine, Ser: 1.69 mg/dL — ABNORMAL HIGH (ref 0.61–1.24)
GFR calc Af Amer: 51 mL/min — ABNORMAL LOW (ref >60)
GFR calc non Af Amer: 44 mL/min — ABNORMAL LOW (ref >60)
Glucose, Bld: 105 mg/dL — ABNORMAL HIGH (ref 70–99)
Potassium: 4.1 mmol/L (ref 3.5–5.1)
Sodium: 128 mmol/L — ABNORMAL LOW (ref 135–145)

## 2020-04-02 LAB — PREPARE RBC (CROSSMATCH)

## 2020-04-02 LAB — URINE DRUG SCREEN, QUALITATIVE (ARMC ONLY)
Amphetamines, Ur Screen: NOT DETECTED
Barbiturates, Ur Screen: NOT DETECTED
Benzodiazepine, Ur Scrn: NOT DETECTED
Cannabinoid 50 Ng, Ur ~~LOC~~: POSITIVE — AB
Cocaine Metabolite,Ur ~~LOC~~: POSITIVE — AB
MDMA (Ecstasy)Ur Screen: NOT DETECTED
Methadone Scn, Ur: NOT DETECTED
Opiate, Ur Screen: NOT DETECTED
Phencyclidine (PCP) Ur S: NOT DETECTED
Tricyclic, Ur Screen: NOT DETECTED

## 2020-04-02 LAB — IRON AND TIBC
Iron: 19 ug/dL — ABNORMAL LOW (ref 45–182)
Saturation Ratios: 8 % — ABNORMAL LOW (ref 17.9–39.5)
TIBC: 241 ug/dL — ABNORMAL LOW (ref 250–450)
UIBC: 222 ug/dL

## 2020-04-02 LAB — CREATININE, URINE, RANDOM: Creatinine, Urine: 217 mg/dL

## 2020-04-02 LAB — HEPATITIS PANEL, ACUTE
HCV Ab: NONREACTIVE
Hep A IgM: NONREACTIVE
Hep B C IgM: NONREACTIVE
Hepatitis B Surface Ag: NONREACTIVE

## 2020-04-02 LAB — OSMOLALITY, URINE: Osmolality, Ur: 343 mOsm/kg (ref 300–900)

## 2020-04-02 LAB — CK: Total CK: 460 U/L — ABNORMAL HIGH (ref 49–397)

## 2020-04-02 LAB — VITAMIN D 25 HYDROXY (VIT D DEFICIENCY, FRACTURES): Vit D, 25-Hydroxy: 8.66 ng/mL — ABNORMAL LOW (ref 30–100)

## 2020-04-02 LAB — FERRITIN: Ferritin: 226 ng/mL (ref 24–336)

## 2020-04-02 LAB — HIV ANTIBODY (ROUTINE TESTING W REFLEX): HIV Screen 4th Generation wRfx: NONREACTIVE

## 2020-04-02 LAB — TROPONIN I (HIGH SENSITIVITY): Troponin I (High Sensitivity): 8 ng/L (ref <18)

## 2020-04-02 LAB — VITAMIN B12: Vitamin B-12: 806 pg/mL (ref 180–914)

## 2020-04-02 LAB — SODIUM, URINE, RANDOM: Sodium, Ur: 23 mmol/L

## 2020-04-02 LAB — MAGNESIUM: Magnesium: 2 mg/dL (ref 1.7–2.4)

## 2020-04-02 SURGERY — FIXATION, FRACTURE, INTERTROCHANTERIC, WITH INTRAMEDULLARY ROD
Anesthesia: Spinal | Laterality: Right

## 2020-04-02 MED ORDER — PROPOFOL 10 MG/ML IV BOLUS
INTRAVENOUS | Status: DC | PRN
  Administered 2020-04-02: 30 mg via INTRAVENOUS
  Administered 2020-04-02: 20 mg via INTRAVENOUS
  Administered 2020-04-02: 50 ug/kg/min via INTRAVENOUS

## 2020-04-02 MED ORDER — ACETAMINOPHEN 500 MG PO TABS
1000.0000 mg | ORAL_TABLET | Freq: Four times a day (QID) | ORAL | Status: AC
  Administered 2020-04-02 – 2020-04-03 (×3): 1000 mg via ORAL
  Filled 2020-04-02 (×3): qty 2

## 2020-04-02 MED ORDER — FENTANYL CITRATE (PF) 100 MCG/2ML IJ SOLN
INTRAMUSCULAR | Status: AC
  Filled 2020-04-02: qty 2

## 2020-04-02 MED ORDER — VASOPRESSIN 20 UNIT/ML IV SOLN
INTRAVENOUS | Status: AC
  Filled 2020-04-02: qty 1

## 2020-04-02 MED ORDER — CEFAZOLIN SODIUM-DEXTROSE 2-4 GM/100ML-% IV SOLN
INTRAVENOUS | Status: AC
  Filled 2020-04-02: qty 100

## 2020-04-02 MED ORDER — DIPHENHYDRAMINE HCL 12.5 MG/5ML PO ELIX: 12.5000 mg | ORAL_SOLUTION | ORAL | Status: DC | PRN

## 2020-04-02 MED ORDER — OXYCODONE HCL 5 MG PO TABS
5.0000 mg | ORAL_TABLET | ORAL | Status: DC | PRN
  Administered 2020-04-04: 10 mg via ORAL
  Administered 2020-04-04: 5 mg via ORAL
  Administered 2020-04-04 – 2020-04-05 (×5): 10 mg via ORAL
  Administered 2020-04-05 – 2020-04-06 (×2): 5 mg via ORAL
  Filled 2020-04-02: qty 2
  Filled 2020-04-02: qty 1
  Filled 2020-04-02 (×4): qty 2
  Filled 2020-04-02: qty 1
  Filled 2020-04-02: qty 2
  Filled 2020-04-02: qty 1

## 2020-04-02 MED ORDER — BUPIVACAINE HCL (PF) 0.5 % IJ SOLN
INTRAMUSCULAR | Status: DC | PRN
  Administered 2020-04-02: 3 mL via INTRATHECAL

## 2020-04-02 MED ORDER — ACETAMINOPHEN 10 MG/ML IV SOLN
INTRAVENOUS | Status: AC
  Filled 2020-04-02: qty 100

## 2020-04-02 MED ORDER — SODIUM CHLORIDE 0.9 % IV SOLN: INTRAVENOUS | Status: DC

## 2020-04-02 MED ORDER — FLEET ENEMA 7-19 GM/118ML RE ENEM: 1.0000 | ENEMA | Freq: Once | RECTAL | Status: DC | PRN

## 2020-04-02 MED ORDER — MIDAZOLAM HCL 2 MG/2ML IJ SOLN
INTRAMUSCULAR | Status: DC | PRN
  Administered 2020-04-02: 2 mg via INTRAVENOUS

## 2020-04-02 MED ORDER — METOCLOPRAMIDE HCL 5 MG/ML IJ SOLN: 5.0000 mg | Freq: Three times a day (TID) | INTRAMUSCULAR | Status: DC | PRN

## 2020-04-02 MED ORDER — DOCUSATE SODIUM 100 MG PO CAPS
100.0000 mg | ORAL_CAPSULE | Freq: Two times a day (BID) | ORAL | Status: DC
  Administered 2020-04-02 – 2020-04-05 (×4): 100 mg via ORAL
  Filled 2020-04-02 (×8): qty 1

## 2020-04-02 MED ORDER — VASOPRESSIN 20 UNIT/ML IV SOLN
INTRAVENOUS | Status: DC | PRN
  Administered 2020-04-02 (×3): 1 [IU] via INTRAVENOUS

## 2020-04-02 MED ORDER — FENTANYL CITRATE (PF) 100 MCG/2ML IJ SOLN
INTRAMUSCULAR | Status: DC | PRN
  Administered 2020-04-02 (×2): 50 ug via INTRAVENOUS

## 2020-04-02 MED ORDER — CEFAZOLIN SODIUM-DEXTROSE 2-4 GM/100ML-% IV SOLN
2.0000 g | Freq: Four times a day (QID) | INTRAVENOUS | Status: AC
  Administered 2020-04-02 – 2020-04-03 (×3): 2 g via INTRAVENOUS
  Filled 2020-04-02 (×3): qty 100

## 2020-04-02 MED ORDER — BUPIVACAINE-EPINEPHRINE (PF) 0.5% -1:200000 IJ SOLN
INTRAMUSCULAR | Status: AC
  Filled 2020-04-02: qty 30

## 2020-04-02 MED ORDER — BUPIVACAINE-EPINEPHRINE (PF) 0.5% -1:200000 IJ SOLN
INTRAMUSCULAR | Status: DC | PRN
  Administered 2020-04-02: 30 mL via PERINEURAL

## 2020-04-02 MED ORDER — ENSURE ENLIVE PO LIQD
237.0000 mL | Freq: Three times a day (TID) | ORAL | Status: DC
  Administered 2020-04-03 – 2020-04-06 (×8): 237 mL via ORAL

## 2020-04-02 MED ORDER — TRAMADOL HCL 50 MG PO TABS
50.0000 mg | ORAL_TABLET | Freq: Four times a day (QID) | ORAL | Status: DC
  Administered 2020-04-02 – 2020-04-05 (×8): 50 mg via ORAL
  Filled 2020-04-02 (×9): qty 1

## 2020-04-02 MED ORDER — KETOROLAC TROMETHAMINE 30 MG/ML IJ SOLN
INTRAMUSCULAR | Status: AC
  Administered 2020-04-02: 30 mg via INTRAVENOUS
  Filled 2020-04-02: qty 1

## 2020-04-02 MED ORDER — BISACODYL 10 MG RE SUPP: 10.0000 mg | Freq: Every day | RECTAL | Status: DC | PRN

## 2020-04-02 MED ORDER — HYDROMORPHONE HCL 1 MG/ML IJ SOLN: 0.5000 mg | INTRAMUSCULAR | Status: DC | PRN

## 2020-04-02 MED ORDER — PROPOFOL 500 MG/50ML IV EMUL
INTRAVENOUS | Status: AC
  Filled 2020-04-02: qty 50

## 2020-04-02 MED ORDER — LACTATED RINGERS IV SOLN: INTRAVENOUS | Status: DC

## 2020-04-02 MED ORDER — MIDAZOLAM HCL 2 MG/2ML IJ SOLN
INTRAMUSCULAR | Status: AC
  Filled 2020-04-02: qty 2

## 2020-04-02 MED ORDER — PROMETHAZINE HCL 25 MG/ML IJ SOLN: 6.2500 mg | INTRAMUSCULAR | Status: DC | PRN

## 2020-04-02 MED ORDER — PHENYLEPHRINE HCL (PRESSORS) 10 MG/ML IV SOLN
INTRAVENOUS | Status: DC | PRN
  Administered 2020-04-02: 150 ug via INTRAVENOUS
  Administered 2020-04-02 (×3): 200 ug via INTRAVENOUS
  Administered 2020-04-02: 100 ug via INTRAVENOUS
  Administered 2020-04-02: 200 ug via INTRAVENOUS
  Administered 2020-04-02: 100 ug via INTRAVENOUS

## 2020-04-02 MED ORDER — ONDANSETRON HCL 4 MG/2ML IJ SOLN: 4.0000 mg | Freq: Four times a day (QID) | INTRAMUSCULAR | Status: DC | PRN

## 2020-04-02 MED ORDER — PROPOFOL 10 MG/ML IV BOLUS
INTRAVENOUS | Status: AC
  Filled 2020-04-02: qty 20

## 2020-04-02 MED ORDER — METOCLOPRAMIDE HCL 10 MG PO TABS: 5.0000 mg | ORAL_TABLET | Freq: Three times a day (TID) | ORAL | Status: DC | PRN

## 2020-04-02 MED ORDER — KETOROLAC TROMETHAMINE 30 MG/ML IJ SOLN: 30.0000 mg | Freq: Once | INTRAMUSCULAR | Status: AC

## 2020-04-02 MED ORDER — ENOXAPARIN SODIUM 40 MG/0.4ML ~~LOC~~ SOLN
40.0000 mg | SUBCUTANEOUS | Status: DC
  Administered 2020-04-03 – 2020-04-06 (×4): 40 mg via SUBCUTANEOUS
  Filled 2020-04-02 (×4): qty 0.4

## 2020-04-02 MED ORDER — FENTANYL CITRATE (PF) 100 MCG/2ML IJ SOLN: 25.0000 ug | INTRAMUSCULAR | Status: DC | PRN

## 2020-04-02 MED ORDER — MAGNESIUM HYDROXIDE 400 MG/5ML PO SUSP
30.0000 mL | Freq: Every day | ORAL | Status: DC | PRN
  Administered 2020-04-04: 30 mL via ORAL
  Filled 2020-04-02: qty 30

## 2020-04-02 MED ORDER — ACETAMINOPHEN 325 MG PO TABS: 325.0000 mg | ORAL_TABLET | Freq: Four times a day (QID) | ORAL | Status: DC | PRN

## 2020-04-02 MED ORDER — ONDANSETRON HCL 4 MG PO TABS: 4.0000 mg | ORAL_TABLET | Freq: Four times a day (QID) | ORAL | Status: DC | PRN

## 2020-04-02 MED ORDER — ADULT MULTIVITAMIN W/MINERALS CH
1.0000 | ORAL_TABLET | Freq: Every day | ORAL | Status: DC
  Administered 2020-04-03 – 2020-04-06 (×4): 1 via ORAL
  Filled 2020-04-02 (×4): qty 1

## 2020-04-02 MED ORDER — ACETAMINOPHEN 10 MG/ML IV SOLN
INTRAVENOUS | Status: DC | PRN
  Administered 2020-04-02: 1000 mg via INTRAVENOUS

## 2020-04-02 MED ORDER — VITAMIN D (ERGOCALCIFEROL) 1.25 MG (50000 UNIT) PO CAPS
50000.0000 [IU] | ORAL_CAPSULE | ORAL | Status: DC
  Administered 2020-04-02: 50000 [IU] via ORAL
  Filled 2020-04-02: qty 1

## 2020-04-02 MED ORDER — MAGNESIUM SULFATE IN D5W 1-5 GM/100ML-% IV SOLN
1.0000 g | Freq: Once | INTRAVENOUS | Status: AC
  Administered 2020-04-02: 1 g via INTRAVENOUS
  Filled 2020-04-02: qty 100

## 2020-04-02 MED ORDER — KETOROLAC TROMETHAMINE 15 MG/ML IJ SOLN
15.0000 mg | Freq: Four times a day (QID) | INTRAMUSCULAR | Status: AC
  Administered 2020-04-03 (×2): 15 mg via INTRAVENOUS
  Filled 2020-04-02 (×3): qty 1

## 2020-04-02 SURGICAL SUPPLY — 47 items
APL PRP STRL LF DISP 70% ISPRP (MISCELLANEOUS) ×2
BIT DRILL 4.3MMS DISTAL GRDTED (BIT) IMPLANT
BNDG COHESIVE 4X5 TAN STRL (GAUZE/BANDAGES/DRESSINGS) ×2 IMPLANT
BNDG COHESIVE 6X5 TAN STRL LF (GAUZE/BANDAGES/DRESSINGS) ×2 IMPLANT
CANISTER SUCT 1200ML W/VALVE (MISCELLANEOUS) ×2 IMPLANT
CHLORAPREP W/TINT 26 (MISCELLANEOUS) ×4 IMPLANT
CNTNR SPEC 2.5X3XGRAD LEK (MISCELLANEOUS) ×1
CONT SPEC 4OZ STER OR WHT (MISCELLANEOUS) ×1
CONT SPEC 4OZ STRL OR WHT (MISCELLANEOUS) ×1
CONTAINER SPEC 2.5X3XGRAD LEK (MISCELLANEOUS) IMPLANT
COVER WAND RF STERILE (DRAPES) ×2 IMPLANT
DRAPE 3/4 80X56 (DRAPES) ×2 IMPLANT
DRAPE C-ARMOR (DRAPES) ×2 IMPLANT
DRILL 4.3MMS DISTAL GRADUATED (BIT) ×2
DRSG OPSITE POSTOP 3X4 (GAUZE/BANDAGES/DRESSINGS) ×2 IMPLANT
DRSG OPSITE POSTOP 4X6 (GAUZE/BANDAGES/DRESSINGS) ×1 IMPLANT
ELECT CAUTERY BLADE 6.4 (BLADE) ×2 IMPLANT
ELECT REM PT RETURN 9FT ADLT (ELECTROSURGICAL) ×2
ELECTRODE REM PT RTRN 9FT ADLT (ELECTROSURGICAL) ×1 IMPLANT
GAUZE SPONGE 4X4 12PLY STRL (GAUZE/BANDAGES/DRESSINGS) ×2 IMPLANT
GLOVE BIO SURGEON STRL SZ8 (GLOVE) ×4 IMPLANT
GLOVE INDICATOR 8.0 STRL GRN (GLOVE) ×2 IMPLANT
GOWN STRL REUS W/ TWL LRG LVL3 (GOWN DISPOSABLE) ×1 IMPLANT
GOWN STRL REUS W/ TWL XL LVL3 (GOWN DISPOSABLE) ×1 IMPLANT
GOWN STRL REUS W/TWL LRG LVL3 (GOWN DISPOSABLE) ×6
GOWN STRL REUS W/TWL XL LVL3 (GOWN DISPOSABLE) ×2
GUIDEPIN VERSANAIL DSP 3.2X444 (ORTHOPEDIC DISPOSABLE SUPPLIES) ×1 IMPLANT
GUIDEWIRE BALL NOSE 100CM (WIRE) ×1 IMPLANT
HFN RH 130 DEG 11MM X 420MM (Nail) ×1 IMPLANT
MAT ABSORB  FLUID 56X50 GRAY (MISCELLANEOUS) ×2
MAT ABSORB FLUID 56X50 GRAY (MISCELLANEOUS) ×1 IMPLANT
NDL FILTER BLUNT 18X1 1/2 (NEEDLE) ×1 IMPLANT
NEEDLE FILTER BLUNT 18X 1/2SAF (NEEDLE) ×1
NEEDLE FILTER BLUNT 18X1 1/2 (NEEDLE) ×1 IMPLANT
NEEDLE HYPO 22GX1.5 SAFETY (NEEDLE) ×2 IMPLANT
NS IRRIG 500ML POUR BTL (IV SOLUTION) ×2 IMPLANT
PACK HIP COMPR (MISCELLANEOUS) ×2 IMPLANT
SCREW BONE CORTICAL 5.0X44 (Screw) ×1 IMPLANT
SCREW LAG 10.5MMX105MM HFN (Screw) ×1 IMPLANT
STAPLER SKIN PROX 35W (STAPLE) ×2 IMPLANT
STRAP SAFETY 5IN WIDE (MISCELLANEOUS) ×2 IMPLANT
SUT VIC AB 0 CT1 36 (SUTURE) ×2 IMPLANT
SUT VIC AB 1 CT1 36 (SUTURE) ×2 IMPLANT
SUT VIC AB 2-0 CT1 (SUTURE) ×4 IMPLANT
SYR 10ML LL (SYRINGE) ×2 IMPLANT
SYR 30ML LL (SYRINGE) ×2 IMPLANT
TAPE MICROFOAM 4IN (TAPE) ×1 IMPLANT

## 2020-04-02 NOTE — Progress Notes (Signed)
PROGRESS NOTE    Ryan [name]  SHF:[id number] DOB: 10/15/1960 DOA: 04/01/2020 PCP: Patient, No Pcp Per   Brief Narrative:  Ryan [name] is a 59 y.o. male with medical history significant of elevated LFTs anemia and depression presented to ED via EMS yesterday after having a mechanical fall and remained on floor for 2 days.  No loss of consciousness.  Per patient he has bilateral lower extremity numbness and occasionally get vertigo resulted in frequent falls.  No prior history of fractures. Patient denies any prior medical condition and does not see a primary care physician.  His lower extremity numbness is going on for more than 5 years, and he never seek any medical attention. Found to have chronic anemia, FOBT negative, anemia panel with mild iron deficiency and more consistent with anemia of chronic disease.  He was given 1 unit of PRBC in ED with no significant response. Imaging with intertrochanteric right hip fracture-orthopedic was consulted and he was going for procedure today.  Subjective: Patient was having some right hip pain.  He lives alone and denies any prior medical history but has not seen a physician for more than 5 years.  He was waiting for his surgery later today.  Assessment & Plan:   Active Problems:   Closed right hip fracture (HCC)   Symptomatic anemia   Hyponatremia   Closed rib fracture   Leukocytosis   Rhabdomyolysis   Dehydration  Closed right intertrochanteric fracture.  Patient is going to OR with orthopedic today. Patient sustained hip fracture by falling on ground level.  According to him he tripped on carpet.  He has an history of frequent falls, stating that he had numbness of his feet going on for more than 5 years which was never being investigated and occasionally gets vertigo. -Patient also need PCP on discharge for further evaluation.  As he qualify for osteoporosis after sustaining a hip fracture with a ground-level fall.  Osteoporosis at his age  is uncommon.  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  Will need DEXA scan and work-up as an outpatient. -Vitamin D level at 8.66, mildly low calcium at 8.3. -Start him on high-dose vitamin D. -Check parathyroid levels. -Check TSH -Follow-up postsurgical recommendations. -Continue pain management. -PT/OT evaluation after surgery.  Bilateral lower extremity numbness/neuropathy.  No prior work-up.  Vitamin B12 above 800. -Outpatient evaluation for further diagnosis and management.  Anemia of chronic disease.  Patient presented with hemoglobin of 7.5. Anemia panel with mild iron deficiency but more consistent with anemia of chronic disease.  FOBT negative so less likely GI loss.  Patient did had recent hip fracture, may be an hematoma at the site of fracture.  No obvious bleeding.  He did not had an appropriate response with blood transfusion. Per chart review he had normal hemoglobin in 2014 and it was listed 10 in 2013.  He also has thrombocytopenia and transaminitis. -Need PCP and hematologic outpatient work-up. -Continue to monitor. -Transfuse if below 7. -Protein electrophoresis was ordered by admitting provider-pending results  Rhabdomyolysis.  Most likely secondary to staying on floor for 2 days.  CK trending down. -Continue IV hydration.  Abnormal liver function.  POA.  Appears chronic as he was investigated approximately 8 years ago.  Hepatitis panel was negative at that time.  Patient has mildly elevated AST, alkaline phosphatase and T bili. Patient admits drinking in the past but states that he stopped drinking few months ago. Patient also has hyponatremia and thrombocytopenia which is more consistent with  chronic liver disease. -Needs outpatient work-up. -Can order abdominal ultrasound tomorrow after the surgery to look for liver cirrhosis.  Hyponatremia.  Patient with a chronically abnormal liver function, no prior diagnosis of liver cirrhosis but labs are more consistent  with chronic liver disease most likely secondary to alcohol abuse in the past. Sodium at 128 today. -Continue to monitor.  Thrombocytopenia.  Platelets were normal on prior CBCs done many years ago.  Currently more consistent with chronic liver disease. No sign of bleeding. -Continue to monitor.  AKI.  Creatinine started trending down with IV hydration. No history of CKD.  Unknown creatinine baseline.  It was 1.3 in 2014. Patient remained on floor for 2 days and appears dehydrated on admission. -Continue IV fluid. -Monitor renal function -Avoid nephrotoxins   Objective: Vitals:   04/02/20 0307 04/02/20 0500 04/02/20 0800 04/02/20 1235  BP: (!) 114/38 116/66 109/65 123/67  Pulse: (!) 113 88  89  Resp: 17 15 13 16   Temp:    97.6 F (36.4 C)  TempSrc:    Temporal  SpO2: 100% 98%  100%  Weight:      Height:        Intake/Output Summary (Last 24 hours) at 04/02/2020 1242 Last data filed at 04/01/2020 2236 Gross per 24 hour  Intake 300 ml  Output --  Net 300 ml   Filed Weights   04/01/20 1709  Weight: 68 kg    Examination:  General exam: Appears calm and comfortable  Respiratory system: Clear to auscultation. Respiratory effort normal. Cardiovascular system: S1 & S2 heard, RRR. No JVD, murmurs, rubs, gallops or clicks. Gastrointestinal system: Soft, nontender, nondistended, bowel sounds positive. Central nervous system: Alert and oriented. No focal neurological deficits. Extremities: No edema, no cyanosis, pulses intact and symmetrical.  Right leg externally rotated. Psychiatry: Judgement and insight appear normal. Mood & affect appropriate.    DVT prophylaxis: SCDs Code Status: Full Family Communication: Discussed with patient Disposition Plan:  Status is: Inpatient  Remains inpatient appropriate because:Inpatient level of care appropriate due to severity of illness   Dispo: The patient is from: Home              Anticipated d/c is to: To be determined               Anticipated d/c date is: 2 days              Patient currently is not medically stable to d/c.  Consultants:   Orthopedic  Procedures:  Antimicrobials:   Data Reviewed: I have personally reviewed following labs and imaging studies  CBC: Recent Labs  Lab 04/01/20 2013 04/02/20 0527  WBC 15.8* 13.2*  NEUTROABS 12.4*  --   HGB 7.5* 7.6*  HCT 20.9* 21.8*  MCV 82.9 80.7  PLT 159 034*   Basic Metabolic Panel: Recent Labs  Lab 04/01/20 2013 04/01/20 2252 04/02/20 0527  NA 125*  --  128*  K 4.7  --  4.1  CL 89*  --  95*  CO2 22  --  22  GLUCOSE 103*  --  105*  BUN 15  --  15  CREATININE 2.40*  --  1.69*  CALCIUM 8.3*  --  7.8*  MG  --  1.6* 2.0  PHOS  --  4.7*  --    GFR: Estimated Creatinine Clearance: 45.8 mL/min (A) (by C-G formula based on SCr of 1.69 mg/dL (H)). Liver Function Tests: Recent Labs  Lab 04/01/20 2013  AST 61*  ALT 28  ALKPHOS 140*  BILITOT 1.4*  PROT 7.1  ALBUMIN 3.9   No results for input(s): LIPASE, AMYLASE in the last 168 hours. No results for input(s): AMMONIA in the last 168 hours. Coagulation Profile: Recent Labs  Lab 04/01/20 2252  INR 1.2   Cardiac Enzymes: Recent Labs  Lab 04/01/20 2013 04/02/20 0527  CKTOTAL 666* 460*   BNP (last 3 results) No results for input(s): PROBNP in the last 8760 hours. HbA1C: No results for input(s): HGBA1C in the last 72 hours. CBG: No results for input(s): GLUCAP in the last 168 hours. Lipid Profile: No results for input(s): CHOL, HDL, LDLCALC, TRIG, CHOLHDL, LDLDIRECT in the last 72 hours. Thyroid Function Tests: No results for input(s): TSH, T4TOTAL, FREET4, T3FREE, THYROIDAB in the last 72 hours. Anemia Panel: Recent Labs    04/01/20 2252  VITAMINB12 806  FOLATE 14.4  FERRITIN 226  TIBC 241*  IRON 19*  RETICCTPCT 4.1*   Sepsis Labs: Recent Labs  Lab 04/01/20 2143 04/01/20 2252  LATICACIDVEN 1.9 1.9    Recent Results (from the past 240 hour(s))  SARS Coronavirus  2 by RT PCR (hospital order, performed in [facility] hospital lab) Nasopharyngeal Nasopharyngeal Swab     Status: None   Collection Time: 04/01/20  8:13 PM   Specimen: Nasopharyngeal Swab  Result Value Ref Range Status   SARS Coronavirus 2 NEGATIVE NEGATIVE Final    Comment: (NOTE) SARS-CoV-2 target nucleic acids are NOT DETECTED.  The SARS-CoV-2 RNA is generally detectable in upper and lower respiratory specimens during the acute phase of infection. The lowest concentration of SARS-CoV-2 viral copies this assay can detect is 250 copies / mL. A negative result does not preclude SARS-CoV-2 infection and should not be used as the sole basis for treatment or other patient management decisions.  A negative result may occur with improper specimen collection / handling, submission of specimen other than nasopharyngeal swab, presence of viral mutation(s) within the areas targeted by this assay, and inadequate number of viral copies (<250 copies / mL). A negative result must be combined with clinical observations, patient history, and epidemiological information.  Fact Sheet for Patients:   StrictlyIdeas.no  Fact Sheet for Healthcare Providers: BankingDealers.co.za  This test is not yet approved or  cleared by the [place] FDA and has been authorized for detection and/or diagnosis of SARS-CoV-2 by FDA under an Emergency Use Authorization (EUA).  This EUA will remain in effect (meaning this test can be used) for the duration of the COVID-19 declaration under Section 564(b)(1) of the Act, 21 U.S.C. section 360bbb-3(b)(1), unless the authorization is terminated or revoked sooner.  Performed at [facility], [address], [place], [place] [address]      Radiology Studies: DG Chest 1 View  Result Date: 04/01/2020 CLINICAL DATA:  Right leg pain. Preoperative evaluation. EXAM: CHEST  1 VIEW COMPARISON:  None. FINDINGS: The  lungs are hyperinflated. There is no evidence of acute infiltrate, pleural effusion or pneumothorax. The heart size and mediastinal contours are within normal limits. Ninth and tenth right rib fractures are seen. These are of indeterminate age. IMPRESSION: 1. No acute cardiopulmonary disease. 2. Ninth and tenth right rib fractures of indeterminate age. Electronically Signed   By: Virgina Norfolk M.D.   On: 04/01/2020 19:15   CT Head Wo Contrast  Result Date: 04/01/2020 CLINICAL DATA:  Status post fall. EXAM: CT HEAD WITHOUT CONTRAST TECHNIQUE: Contiguous axial images were obtained from the base of the skull through  the vertex without intravenous contrast. COMPARISON:  None. FINDINGS: Brain: There is mild cerebral atrophy with widening of the extra-axial spaces and ventricular dilatation. There are areas of decreased attenuation within the white matter tracts of the supratentorial brain, consistent with microvascular disease changes. Vascular: No hyperdense vessel or unexpected calcification. Skull: Normal. Negative for fracture or focal lesion. Sinuses/Orbits: A chronic deformity is seen involving the right maxillary sinus. Other: None. IMPRESSION: 1. Generalized cerebral atrophy. 2. No acute intracranial abnormality. Electronically Signed   By: Virgina Norfolk M.D.   On: 04/01/2020 20:00   CT Cervical Spine Wo Contrast  Result Date: 04/01/2020 CLINICAL DATA:  Status post fall. EXAM: CT CERVICAL SPINE WITHOUT CONTRAST TECHNIQUE: Multidetector CT imaging of the cervical spine was performed without intravenous contrast. Multiplanar CT image reconstructions were also generated. COMPARISON:  None. FINDINGS: Alignment: There is approximately 2 mm retrolisthesis of the C3 vertebral body on C4. 2 mm retrolisthesis of the C5 vertebral body on C6 is also seen. Skull base and vertebrae: No acute fracture. No primary bone lesion or focal pathologic process. Soft tissues and spinal canal: No prevertebral fluid or  swelling. No visible canal hematoma. Disc levels: Moderate to marked severity endplate sclerosis is seen at the levels of C3-C4, C4-C5 and C5-C6. Mild to moderate severity endplate sclerosis is noted at the level of C6-C7. Marked severity intervertebral disc space narrowing is seen at the levels of C3-C4, C4-C5, C5-C6 and C6-C7. Mild to moderate severity bilateral multilevel facet joint hypertrophy is seen. Upper chest: There is mild biapical scarring and/or atelectasis. Other: None. IMPRESSION: 1. No acute fracture within the cervical spine. 2. Marked severity multilevel degenerative changes, most prominent at the levels of C3-C4, C4-C5, C5-C6 and C6-C7. 3. 2 mm retrolisthesis of the C3 vertebral body on C4 and C5 vertebral body on C6. Electronically Signed   By: Virgina Norfolk M.D.   On: 04/01/2020 20:06   DG Tibia/Fibula Right Port  Result Date: 04/01/2020 CLINICAL DATA:  Right leg pain and swelling. EXAM: PORTABLE RIGHT TIBIA AND FIBULA - 2 VIEW COMPARISON:  November 08, 2014 FINDINGS: There is no evidence of an acute fracture or dislocation. A chronic fracture of the right lateral malleolus is noted. Soft tissues are unremarkable. IMPRESSION: No acute osseous abnormality. Electronically Signed   By: Virgina Norfolk M.D.   On: 04/01/2020 18:36   DG Hip Unilat W or Wo Pelvis 2-3 Views Right  Result Date: 04/01/2020 CLINICAL DATA:  Status post fall. EXAM: DG HIP (WITH OR WITHOUT PELVIS) 2-3V RIGHT COMPARISON:  None. FINDINGS: Acute inter trochanteric fracture is seen involving the proximal right femur. There is no evidence of dislocation. There is no evidence of arthropathy or other focal bone abnormality. IMPRESSION: Acute intertrochanteric fracture of the proximal right femur. Electronically Signed   By: Virgina Norfolk M.D.   On: 04/01/2020 19:14    Scheduled Meds: . [MAR Hold] sodium chloride   Intravenous Once  . [MAR Hold] sodium chloride   Intravenous Once   Continuous Infusions: .  sodium chloride 75 mL/hr at 04/02/20 1043  . [MAR Hold]  ceFAZolin (ANCEF) IV    . lactated ringers    . [MAR Hold] methocarbamol (ROBAXIN) IV       LOS: 1 day   Time spent: 45 minutes.  Lorella Nimrod, MD [facility]  If 7PM-7AM, please contact night-coverage [email or web address]  04/02/2020, 12:42 PM   This record has been created using Systems analyst. Errors have been sought and corrected,but may  not always be located. Such creation errors do not reflect on the standard of care.

## 2020-04-02 NOTE — TOC Initial Note (Signed)
Transition of Care [facility]) - Initial/Assessment Note    Patient Details  Name: Ryan [name] MRN: [id number] Date of Birth: 1961/05/03  Transition of Care [facility]) CM/SW Contact:    Anselm Pancoast, RN Phone Number: 04/02/2020, 11:15 AM  Clinical Narrative:                 Spoke to patient at bedside. Patient concerned about returning to work and not having insurance. 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 Patient is worried about not having insurance and receiving care after surgery. RN CM discussed charity home health and importance of following PT plan. Patient states he wants to do that but has to work to pay his bills. Patient drives himself to and from appointments. RN CM discussed [facility] and medication management at discharge for meds and follow up with PCP for chronic conditions. Confirmed patient makes $1800/monthly and unlikely to qualify for Medicaid to assist with placement. TOC will follow patient throughout hospitalization and assist with discharge.   Expected Discharge Plan: [place] Barriers to Discharge: Continued Medical Work up   Patient Goals and CMS Choice Patient states their goals for this hospitalization and ongoing recovery are:: get better and get back to work      Expected Discharge Plan and Services Expected Discharge Plan: [place]       Living arrangements for the past 2 months: Single Family Home                                      Prior Living Arrangements/Services Living arrangements for the past 2 months: Single Family Home Lives with:: Self Patient language and need for interpreter reviewed:: Yes Do you feel safe going back to the place where you live?: Yes      Need for Family Participation in Patient Care: Yes (Comment) Care giver support system in place?: No (comment)   Criminal Activity/Legal Involvement Pertinent to  Current Situation/Hospitalization: No - Comment as needed  Activities of Daily Living      Permission Sought/Granted Permission sought to share information with : Case Manager Permission granted to share information with : Yes, Verbal Permission Granted  Share Information with NAME: TOC Department           Emotional Assessment Appearance:: Appears stated age Attitude/Demeanor/Rapport: Ambitious, Engaged, Self-Confident Affect (typically observed): Accepting Orientation: : Oriented to Self, Oriented to Place, Oriented to  Time, Oriented to Situation Alcohol / Substance Use: Never Used Psych Involvement: No (comment)  Admission diagnosis:  Closed right hip fracture ([place]) [S72.001A] Patient Active Problem List   Diagnosis Date Noted  . Closed right hip fracture ([place]) 04/01/2020  . Symptomatic anemia 04/01/2020  . Hyponatremia 04/01/2020  . Closed rib fracture 04/01/2020  . Leukocytosis 04/01/2020  . Rhabdomyolysis 04/01/2020  . Dehydration 04/01/2020   PCP:  Patient, No Pcp Per Pharmacy:   [facility], [place] [address] [place] [address] Phone: [phone] Fax: [phone]  [id number] [place], [place] - [address] [place] [address] Phone: [phone] Fax: [phone]     Social Determinants of Health (SDOH) Interventions    Readmission Risk  Interventions No flowsheet data found.

## 2020-04-02 NOTE — ED Notes (Addendum)
Report received from Amy RN, upon introduction pt noted to be eating sandwich tray. Food and drink removed but pt had eaten about 50% of tray. Explained to pt NPO status pending surgery in the am. Pt in no distress, rates pain 5/10 at this time, fluids infusing well.

## 2020-04-02 NOTE — ED Notes (Signed)
Pt unable to void at this time. 

## 2020-04-02 NOTE — ED Notes (Signed)
Pt sleeping  Pt unable to void at this time

## 2020-04-02 NOTE — ED Notes (Signed)
Pt sleeping   meds infusing  nsr on monitor.

## 2020-04-02 NOTE — Anesthesia Preprocedure Evaluation (Signed)
Anesthesia Evaluation  Patient identified by MRN, date of birth, ID band Patient awake    Reviewed: Allergy & Precautions, H&P , NPO status , Patient's Chart, lab work & pertinent test results, reviewed documented beta blocker date and time   History of Anesthesia Complications Negative for: history of anesthetic complications  Airway Mallampati: III  TM Distance: >3 FB Neck ROM: full    Dental  (+) Dental Advidsory Given, Teeth Intact   Pulmonary neg shortness of breath, neg sleep apnea, neg COPD, neg recent URI, Current Smoker and Patient abstained from smoking.,    Pulmonary exam normal breath sounds clear to auscultation       Cardiovascular Exercise Tolerance: Good negative cardio ROS Normal cardiovascular exam Rhythm:regular Rate:Normal     Neuro/Psych PSYCHIATRIC DISORDERS Depression negative neurological ROS     GI/Hepatic negative GI ROS, Neg liver ROS,   Endo/Other  negative endocrine ROS  Renal/GU Renal disease ([place])  negative genitourinary   Musculoskeletal   Abdominal   Peds  Hematology  (+) Blood dyscrasia, anemia ,   Anesthesia Other Findings Past Medical History: No date: Adenomatous colon polyp No date: Anemia No date: Depression No date: Elevated LFTs   Reproductive/Obstetrics negative OB ROS                             Anesthesia Physical Anesthesia Plan  ASA: II  Anesthesia Plan: Spinal   Post-op Pain Management:    Induction:   PONV Risk Score and Plan: Propofol infusion and TIVA  Airway Management Planned: Natural Airway and Simple Face Mask  Additional Equipment:   Intra-op Plan:   Post-operative Plan:   Informed Consent: I have reviewed the patients History and Physical, chart, labs and discussed the procedure including the risks, benefits and alternatives for the proposed anesthesia with the patient or authorized representative who has  indicated his/her understanding and acceptance.     Dental Advisory Given  Plan Discussed with: Anesthesiologist, CRNA and Surgeon  Anesthesia Plan Comments:         Anesthesia Quick Evaluation

## 2020-04-02 NOTE — ED Notes (Signed)
prbc's complete.  No reaction noted.  Iv in place

## 2020-04-02 NOTE — Progress Notes (Signed)
Dr. Rosey Bath at bedside, aware of pt hg and uds.

## 2020-04-02 NOTE — Op Note (Signed)
04/02/2020  4:11 PM  Patient:   Ryan [name]  Pre-Op Diagnosis:   Closed displaced intertrochanteric fracture, right hip.  Post-Op Diagnosis:   Same  Procedure:   Reduction and internal fixation of closed displaced intertrochanteric right hip fracture with Biomet Affixis TFN nail.  Surgeon:   Pascal Lux, MD  Assistant:   Kirkland Hun, PA-S  Anesthesia:   Spinal  Findings:   As above  Complications:   None  EBL:   25 cc  Fluids:   500 cc crystalloid  UOP:   None  TT:   None  Drains:   None  Closure:   Staples  Implants:   Biomet Affixis 11 x 420 mm TFN with a 105 mm lag screw and a 44 mm distal interlocking screw  Brief Clinical Note:   The patient is a 59 year old male who sustained the above-noted injury 3 days ago when he apparently fell while in his home. Did not seek treatment immediately. However, because of continued pain and inability to ambulate, he finally called the EMS who brought him to [facility]. X-rays in the emergency room confirmed the presence of a displaced intertrochanteric right hip fracture. The patient has been cleared medically and presents at this time for reduction and internal fixation of the displaced intertrochanteric right hip fracture.  Procedure:   The patient was brought into the operating room. After adequate spinal anesthesia was obtained, the patient was lain in the supine position on the fracture table. The uninjured leg was placed in a flexed and abducted position while the injured lower extremity was placed in longitudinal traction. The fracture was reduced using longitudinal traction and internal rotation. The adequacy of reduction was verified fluoroscopically in AP and lateral projections and found to be near anatomic. The lateral aspects of the right hip and thigh were prepped with ChloraPrep solution before being draped sterilely. Preoperative antibiotics were administered. A timeout was performed to verify the appropriate surgical  site. The greater trochanter was identified fluoroscopically and an approximately 3 cm incision made about 2-3 fingerbreadths above the tip of the greater trochanter. The incision was carried down through the subcutaneous tissues to expose the gluteal fascia. This was split the length of the incision, providing access to the tip of the trochanter. Under fluoroscopic guidance, a guidewire was drilled through the tip of the trochanter into the proximal metaphysis to the level of the lesser trochanter. After verifying its position fluoroscopically in AP and lateral projections, it was overreamed with the initial reamer to the depth of the lesser trochanter. A guidewire was passed down through the femoral canal to the supracondylar region. The adequacy of guidewire position was verified fluoroscopically in AP and lateral projections before the length of the guidewire within the canal was measured and found to be 440 mm. Therefore, a 420 mm length nail was selected. The guidewire was overreamed sequentially using the flexible reamers, beginning with a 10 mm reamer and progressing to a 13 mm reamer. This provided good cortical chatter. The 11 x 420 mm Biomet Affixis TFN rod was selected and advanced to the appropriate depth, as verified fluoroscopically.   The guide system for the lag screw was positioned and advanced through an approximately 2 cm stab incision over the lateral aspect of the proximal femur. The guidewire was drilled up through the trochanteric femoral nail and into the femoral neck to rest within 5 mm of subchondral bone. After verifying its position in the femoral neck and head in both AP  and lateral projections, the guidewire was measured and found to be optimally replicated by a 027 mm lag screw. The guidewire was overreamed to the appropriate depth before the lag screw was inserted and advanced to the appropriate depth as verified fluoroscopically in AP and lateral projections. The locking screw was  advanced, then backed off a quarter turn to set the lag screw. Again the adequacy of hardware position and fracture reduction was verified fluoroscopically in AP and lateral projections and found to be excellent.  Attention was directed distally. Using the "perfect circle" technique, the leg and fluoroscopy machine were positioned appropriately. An approximately 1.5 cm stab incision was made over the skin at the appropriate point before the drill bit was advanced through the cortex and across the static hole of the nail. The appropriate length of the screw was determined before the 44 mm distal interlocking screw was positioned, then advanced and tightened securely. Again the adequacy of screw position was verified fluoroscopically in AP and lateral projections and found to be excellent.  The wounds were irrigated thoroughly with sterile saline solution before the abductor fascia was reapproximated using #1 Vicryl interrupted sutures. The subcutaneous tissues were closed using 2-0 Vicryl interrupted sutures. The skin was closed using staples. A total of 30 cc of 0.5% Sensorcaine with epinephrine was injected in and around all incisions. Sterile occlusive dressings were applied to all wounds before the patient was transferred back to his/her hospital bed. The patient was then transferred to the recovery room in satisfactory condition after tolerating the procedure well.

## 2020-04-02 NOTE — Progress Notes (Signed)
Initial Nutrition Assessment  DOCUMENTATION CODES:   Underweight  INTERVENTION:   Ensure Enlive po TID, each supplement provides 350 kcal and 20 grams of protein  MVI daily   Recommend Oscal with D po BID   NUTRITION DIAGNOSIS:   Increased nutrient needs related to hip fracture as evidenced by increased  estimated needs.  GOAL:   Patient will meet greater than or equal to 90% of their needs  MONITOR:   PO intake, Supplement acceptance, Labs, Weight trends, Skin, I & O's  REASON FOR ASSESSMENT:   Consult Hip fracture protocol  ASSESSMENT:   59 y/o male with h/o depression and anemia who is admitted with hip fracture s/p fall   Unable to see pt as pt in surgery at time of RD visit. Pt with increased estimated needs r/t hip fracture. RD will add supplements and MVI to help pt meet his estimated needs. Pt is at high risk for malnutrition. RD will obtain nutrition related history and exam at follow-up.   Medications reviewed and include: vitamin D, NaCl @75ml /hr  Labs reviewed: Na 128(L), creat 1.69(H), CK 460(H) Wbc 13.2(H), Hgb 7.6(L), Hct 21.8(L)  NUTRITION - FOCUSED PHYSICAL EXAM: Unable to perform at this time   Diet Order:   Diet Order            Diet NPO time specified  Diet effective now                EDUCATION NEEDS:   Not appropriate for education at this time  Skin:  Skin Assessment: Reviewed RN Assessment (incision R hip)  Last BM:  pta  Height:   Ht Readings from Last 1 Encounters:  04/01/20 6\' 5"  (1.956 m)    Weight:   Wt Readings from Last 1 Encounters:  04/01/20 68 kg    Ideal Body Weight:  94.5 kg  BMI:  Body mass index is 17.79 kg/m.  Estimated Nutritional Needs:   Kcal:  2100-2400kcal/day  Protein:  105-120g/day  Fluid:  >2L/day  Koleen Distance MS, RD, LDN Please refer to [facility] for RD and/or RD on-call/weekend/after hours pager

## 2020-04-02 NOTE — ED Notes (Signed)
Assessed pts pain , 4/10, pt states that he is comfortable, requests lights off so he can rest.

## 2020-04-02 NOTE — Consult Note (Signed)
ORTHOPAEDIC CONSULTATION  REQUESTING PHYSICIAN: No att. providers found  Chief Complaint:   Right hip pain.  History of Present Illness: Ryan [name] is a 59 y.o. male with a history of depression and chronic anemia who lives independently.  Apparently, he lost his balance and fell 3 days ago, injuring his right hip.  Initially, he tried to avoid seeking treatment, but had such severe pain with any attempted movement that, after 3 days, he finally decided to call the EMS.  He was brought to the emergency room where x-rays demonstrated a displaced intertrochanteric fracture of the right hip.  The patient has been admitted in preparation for definitive management of this injury.  The patient denies any associated injury resulting from the fall.  He did not strike his head or lose consciousness.  The patient also denies any lightheadedness, dizziness, chest pain, shortness of breath, or other symptoms which may have precipitated his fall.  Past Medical History:  Diagnosis Date   Adenomatous colon polyp    Anemia    Depression    Elevated LFTs    History reviewed. No pertinent surgical history. Social History   Socioeconomic History   Marital status: Single    Spouse name: Not on file   Number of children: Not on file   Years of education: Not on file   Highest education level: Not on file  Occupational History   Not on file  Tobacco Use   Smoking status: Current Some Day Smoker   Smokeless tobacco: Never Used  Substance and Sexual Activity   Alcohol use: Yes    Comment: 4 beers weekly   Drug use: No   Sexual activity: Not on file  Other Topics Concern   Not on file  Social History Narrative   Not on file   Social Determinants of Health   Financial Resource Strain:    Difficulty of Paying Living Expenses:   Food Insecurity:    Worried About Charity fundraiser in the Last Year:    Academic librarian in the Last Year:   Transportation Needs:    Film/video editor (Medical):    Lack of Transportation (Non-Medical):   Physical Activity:    Days of Exercise per Week:    Minutes of Exercise per Session:   Stress:    Feeling of Stress :   Social Connections:    Frequency of Communication with Friends and Family:    Frequency of Social Gatherings with Friends and Family:    Attends Religious Services:    Active Member of Clubs or Organizations:    Attends Music therapist:    Marital Status:    Family History  Problem Relation Age of Onset   Heart disease Other    Colon polyps Cousin        maternal   Colon polyps Cousin    Colon polyps Maternal Aunt    Colon cancer Maternal Grandfather    No Known Allergies Prior to Admission medications   Medication Sig Start Date End Date Taking? Authorizing Provider  bacitracin ointment Apply 1 application topically 2 (two) times daily. 10/22/14   [address], [place], [place]   DG Chest 1 View  Result Date: 04/01/2020 CLINICAL DATA:  Right leg pain. Preoperative evaluation. EXAM: CHEST  1 VIEW COMPARISON:  None. FINDINGS: The lungs are hyperinflated. There is no evidence of acute infiltrate, pleural effusion or pneumothorax. The heart size and mediastinal contours are within normal limits. Ninth and tenth right  rib fractures are seen. These are of indeterminate age. IMPRESSION: 1. No acute cardiopulmonary disease. 2. Ninth and tenth right rib fractures of indeterminate age. Electronically Signed   By: Virgina Norfolk M.D.   On: 04/01/2020 19:15   CT Head Wo Contrast  Result Date: 04/01/2020 CLINICAL DATA:  Status post fall. EXAM: CT HEAD WITHOUT CONTRAST TECHNIQUE: Contiguous axial images were obtained from the base of the skull through the vertex without intravenous contrast. COMPARISON:  None. FINDINGS: Brain: There is mild cerebral atrophy with widening of the extra-axial spaces and ventricular dilatation.  There are areas of decreased attenuation within the white matter tracts of the supratentorial brain, consistent with microvascular disease changes. Vascular: No hyperdense vessel or unexpected calcification. Skull: Normal. Negative for fracture or focal lesion. Sinuses/Orbits: A chronic deformity is seen involving the right maxillary sinus. Other: None. IMPRESSION: 1. Generalized cerebral atrophy. 2. No acute intracranial abnormality. Electronically Signed   By: Virgina Norfolk M.D.   On: 04/01/2020 20:00   CT Cervical Spine Wo Contrast  Result Date: 04/01/2020 CLINICAL DATA:  Status post fall. EXAM: CT CERVICAL SPINE WITHOUT CONTRAST TECHNIQUE: Multidetector CT imaging of the cervical spine was performed without intravenous contrast. Multiplanar CT image reconstructions were also generated. COMPARISON:  None. FINDINGS: Alignment: There is approximately 2 mm retrolisthesis of the C3 vertebral body on C4. 2 mm retrolisthesis of the C5 vertebral body on C6 is also seen. Skull base and vertebrae: No acute fracture. No primary bone lesion or focal pathologic process. Soft tissues and spinal canal: No prevertebral fluid or swelling. No visible canal hematoma. Disc levels: Moderate to marked severity endplate sclerosis is seen at the levels of C3-C4, C4-C5 and C5-C6. Mild to moderate severity endplate sclerosis is noted at the level of C6-C7. Marked severity intervertebral disc space narrowing is seen at the levels of C3-C4, C4-C5, C5-C6 and C6-C7. Mild to moderate severity bilateral multilevel facet joint hypertrophy is seen. Upper chest: There is mild biapical scarring and/or atelectasis. Other: None. IMPRESSION: 1. No acute fracture within the cervical spine. 2. Marked severity multilevel degenerative changes, most prominent at the levels of C3-C4, C4-C5, C5-C6 and C6-C7. 3. 2 mm retrolisthesis of the C3 vertebral body on C4 and C5 vertebral body on C6. Electronically Signed   By: Virgina Norfolk M.D.   On:  04/01/2020 20:06   DG Tibia/Fibula Right Port  Result Date: 04/01/2020 CLINICAL DATA:  Right leg pain and swelling. EXAM: PORTABLE RIGHT TIBIA AND FIBULA - 2 VIEW COMPARISON:  November 08, 2014 FINDINGS: There is no evidence of an acute fracture or dislocation. A chronic fracture of the right lateral malleolus is noted. Soft tissues are unremarkable. IMPRESSION: No acute osseous abnormality. Electronically Signed   By: Virgina Norfolk M.D.   On: 04/01/2020 18:36   DG Hip Unilat W or Wo Pelvis 2-3 Views Right  Result Date: 04/01/2020 CLINICAL DATA:  Status post fall. EXAM: DG HIP (WITH OR WITHOUT PELVIS) 2-3V RIGHT COMPARISON:  None. FINDINGS: Acute inter trochanteric fracture is seen involving the proximal right femur. There is no evidence of dislocation. There is no evidence of arthropathy or other focal bone abnormality. IMPRESSION: Acute intertrochanteric fracture of the proximal right femur. Electronically Signed   By: Virgina Norfolk M.D.   On: 04/01/2020 19:14    Positive ROS: All other systems have been reviewed and were otherwise negative with the exception of those mentioned in the HPI and as above.  Physical Exam: General:  Alert, no acute distress Psychiatric:  Patient is competent for consent with normal mood and affect   Cardiovascular:  No pedal edema Respiratory:  No wheezing, non-labored breathing GI:  Abdomen is soft and non-tender Skin:  No lesions in the area of chief complaint Neurologic:  Sensation intact distally Lymphatic:  No axillary or cervical lymphadenopathy  Orthopedic Exam:  Orthopedic examination is limited to the right hip and lower extremity.  The right lower extremity is somewhat shortened and externally rotated as compared to the left.  There is moderate swelling around the hip and thigh, but no erythema, ecchymosis, abrasions, or other skin abnormalities are identified.  He has mild-moderate tenderness to palpation over the lateral aspect of the right  hip.  He has more severe pain with any attempted active or passive motion of the hip.  He is able to dorsiflex and plantarflex his toes and ankle.  Sensations intact to light touch to all distributions.  He has good capillary refill to his right foot.  X-rays:  Recent x-rays of the pelvis and right hip are available for review and have been reviewed by myself.  These films demonstrate a comminuted displaced intertrochanteric fracture of the right hip.  The right hip itself appears to be well-maintained without evidence for any significant degenerative changes.  No lytic lesions or other acute bony abnormalities are identified.  Assessment: Closed displaced intertrochanteric right hip fracture.  Plan: The treatment options have been discussed with the patient, including both surgical and nonsurgical choices.  The patient would like to proceed with surgical intervention to include an intramedullary nailing of the displaced intertrochanteric right hip fracture.  This procedure has been discussed in detail with the patient, as have the potential risks (including bleeding, infection, nerve and/or blood vessel injury, persistent or recurrent pain, malunion or nonunion, progression to degenerative joint disease of the right hip, need for further surgery, blood clots, strokes, heart attacks and/or arrhythmias, etc.) and benefits.  The patient states his understanding and wishes to proceed.  A formal written consent has been obtained.  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  Given that he is already 3 days out from his surgery and is in quite a bit of pain, the patient will be taken to the OR at this time on an emergent basis to have this fracture stabilized.  The patient understands that he is at a somewhat increased risk for an adverse reaction to the anesthesia, given his recent use of cocaine.  Thank you for asking me to  participate in the care of this most inquisitive patient.  I will be happy to follow him with you.   Pascal Lux, MD  Beeper #:  [phone]  04/02/2020 1:26 PM

## 2020-04-02 NOTE — ED Notes (Signed)
Pt sleeping.  prbc's continue to infuse.

## 2020-04-02 NOTE — Transfer of Care (Signed)
Immediate Anesthesia Transfer of Care Note  Patient: Ryan [name]  Procedure(s) Performed: INTRAMEDULLARY (IM) NAIL INTERTROCHANTRIC (Right )  Patient Location: PACU  Anesthesia Type:General  Level of Consciousness: drowsy  Airway & Oxygen Therapy: Patient Spontanous Breathing  Post-op Assessment: Report given to RN and Post -op Vital signs reviewed and stable  Post vital signs: Reviewed and stable  Last Vitals:  Vitals Value Taken Time  BP 111/56 04/02/20 1600  Temp 36.5 C 04/02/20 1600  Pulse 69 04/02/20 1604  Resp 16 04/02/20 1604  SpO2 100 % 04/02/20 1604  Vitals shown include unvalidated device data.  Last Pain:  Vitals:   04/02/20 1235  TempSrc: Temporal  PainSc: 5          Complications: No complications documented.

## 2020-04-02 NOTE — TOC Progression Note (Signed)
Transition of Care [facility]) - Progression Note    Patient Details  Name: Ryan [name] MRN: [id number] Date of Birth: 1960/12/28  [facility] CM/SW [place], RN Phone Number: 04/02/2020, 11:23 AM  Clinical Narrative:    Patient requested RN CM contact his employer[employer] and update them that he was in the hospital.   RN CM contacted [facility] @ [phone] Elvis Coil at patients request and updated that patient had been admitted to hospital.    Expected Discharge Plan: [place] Barriers to Discharge: Continued Medical Work up  Expected Discharge Plan and Services Expected Discharge Plan: [place] arrangements for the past 2 months: Single Family Home                                       Social Determinants of Health (SDOH) Interventions    Readmission Risk Interventions No flowsheet data found.

## 2020-04-02 NOTE — ED Notes (Signed)
Report off to raquel rn  

## 2020-04-02 NOTE — Anesthesia Procedure Notes (Signed)
Spinal  Patient location during procedure: OR Start time: 04/02/2020 2:23 PM End time: 04/02/2020 2:25 PM Staffing Performed: anesthesiologist  Anesthesiologist: Martha Clan, MD Preanesthetic Checklist Completed: patient identified, IV checked, site marked, risks and benefits discussed, surgical consent, monitors and equipment checked, pre-op evaluation and timeout performed Spinal Block Patient position: sitting Prep: ChloraPrep Patient monitoring: heart rate, continuous pulse ox, blood pressure and cardiac monitor Approach: midline Location: L3-4 Injection technique: single-shot Needle Needle type: Whitacre and Introducer  Needle gauge: 24 G Needle length: 9 cm Additional Notes Negative paresthesia. Negative blood return. Positive free-flowing CSF. Expiration date of kit checked and confirmed. Patient tolerated procedure well, without complications.

## 2020-04-03 ENCOUNTER — Encounter: Payer: Self-pay | Admitting: Surgery

## 2020-04-03 ENCOUNTER — Inpatient Hospital Stay: Payer: Self-pay

## 2020-04-03 LAB — BASIC METABOLIC PANEL
Anion gap: 7 (ref 5–15)
BUN: 12 mg/dL (ref 6–20)
CO2: 24 mmol/L (ref 22–32)
Calcium: 7.8 mg/dL — ABNORMAL LOW (ref 8.9–10.3)
Chloride: 97 mmol/L — ABNORMAL LOW (ref 98–111)
Creatinine, Ser: 0.98 mg/dL (ref 0.61–1.24)
GFR calc Af Amer: >60 mL/min (ref >60)
GFR calc non Af Amer: >60 mL/min (ref >60)
Glucose, Bld: 92 mg/dL (ref 70–99)
Potassium: 3.7 mmol/L (ref 3.5–5.1)
Sodium: 128 mmol/L — ABNORMAL LOW (ref 135–145)

## 2020-04-03 LAB — COMPREHENSIVE METABOLIC PANEL
ALT: 16 U/L (ref 0–44)
AST: 33 U/L (ref 15–41)
Albumin: 2.6 g/dL — ABNORMAL LOW (ref 3.5–5.0)
Alkaline Phosphatase: 94 U/L (ref 38–126)
Anion gap: 6 (ref 5–15)
BUN: 13 mg/dL (ref 6–20)
CO2: 23 mmol/L (ref 22–32)
Calcium: 7.5 mg/dL — ABNORMAL LOW (ref 8.9–10.3)
Chloride: 98 mmol/L (ref 98–111)
Creatinine, Ser: 1.2 mg/dL (ref 0.61–1.24)
GFR calc Af Amer: >60 mL/min (ref >60)
GFR calc non Af Amer: >60 mL/min (ref >60)
Glucose, Bld: 99 mg/dL (ref 70–99)
Potassium: 3.5 mmol/L (ref 3.5–5.1)
Sodium: 127 mmol/L — ABNORMAL LOW (ref 135–145)
Total Bilirubin: 0.5 mg/dL (ref 0.3–1.2)
Total Protein: 4.8 g/dL — ABNORMAL LOW (ref 6.5–8.1)

## 2020-04-03 LAB — CBC
HCT: 16.6 % — ABNORMAL LOW (ref 39.0–52.0)
Hemoglobin: 5.6 g/dL — ABNORMAL LOW (ref 13.0–17.0)
MCH: 29.3 pg (ref 26.0–34.0)
MCHC: 33.7 g/dL (ref 30.0–36.0)
MCV: 86.9 fL (ref 80.0–100.0)
Platelets: 124 10*3/uL — ABNORMAL LOW (ref 150–400)
RBC: 1.91 MIL/uL — ABNORMAL LOW (ref 4.22–5.81)
RDW: 13.5 % (ref 11.5–15.5)
WBC: 11.3 10*3/uL — ABNORMAL HIGH (ref 4.0–10.5)
nRBC: 0 % (ref 0.0–0.2)

## 2020-04-03 LAB — TSH: TSH: 1.532 u[IU]/mL (ref 0.350–4.500)

## 2020-04-03 LAB — SURGICAL PATHOLOGY

## 2020-04-03 LAB — HEMOGLOBIN AND HEMATOCRIT, BLOOD
HCT: 22.9 % — ABNORMAL LOW (ref 39.0–52.0)
Hemoglobin: 7.7 g/dL — ABNORMAL LOW (ref 13.0–17.0)

## 2020-04-03 LAB — PREPARE RBC (CROSSMATCH)

## 2020-04-03 LAB — CORTISOL-AM, BLOOD: Cortisol - AM: 19.8 ug/dL (ref 6.7–22.6)

## 2020-04-03 MED ORDER — SODIUM CHLORIDE 0.9% IV SOLUTION: Freq: Once | INTRAVENOUS | Status: AC

## 2020-04-03 NOTE — Anesthesia Postprocedure Evaluation (Signed)
Anesthesia Post Note  Patient: Ryan [name]  Procedure(s) Performed: INTRAMEDULLARY (IM) NAIL INTERTROCHANTRIC (Right )  Patient location during evaluation: Nursing Unit Anesthesia Type: Spinal Level of consciousness: oriented and awake and alert Pain management: pain level controlled Vital Signs Assessment: post-procedure vital signs reviewed and stable Respiratory status: spontaneous breathing and respiratory function stable Cardiovascular status: blood pressure returned to baseline and stable Postop Assessment: no headache, no backache, no apparent nausea or vomiting and patient able to bend at knees Anesthetic complications: no   No complications documented.   Last Vitals:  Vitals:   04/03/20 0429 04/03/20 0752  BP: (!) 96/55 109/61  Pulse: 81 76  Resp: 18 16  Temp: 36.9 C (!) 36.4 C  SpO2: 100% 100%    Last Pain:  Vitals:   04/03/20 0752  TempSrc: Oral  PainSc:                  [name]

## 2020-04-03 NOTE — TOC Progression Note (Signed)
Transition of Care [facility]) - Progression Note    Patient Details  Name: Ryan [name] MRN: [id number] Date of Birth: 1961/08/12  Transition of Care [facility]) CM/SW Cricket, RN Phone Number: 04/03/2020, 10:51 AM  Clinical Narrative:    The patient will need [place] set up with [facility], He is unable to work with PT at this time due to low Hgb, Will set the patient up with [place] when he is closer to [place],    Expected Discharge Plan: [place] Barriers to Discharge: Continued Medical Work up  Expected Discharge Plan and Services Expected Discharge Plan: [place] arrangements for the past 2 months: Single Family Home                                       Social Determinants of Health (SDOH) Interventions    Readmission Risk Interventions No flowsheet data found.

## 2020-04-03 NOTE — Progress Notes (Signed)
PROGRESS NOTE    [name]  XNT:[id number] DOB: [birth year] DOA: 04/01/2020 PCP: Ryan [name], No Pcp Per    Brief Narrative:  Ryan [name] admitted to the hospital with a working diagnosis of right intertrochanteric fracture.  59 year old male who presented with right leg pain.  He has a significant past medical history for anemia and depression.  Ryan [name] tripped and fell from his own height, no loss of consciousness but significant right leg pain, Ryan [name] unable to stand and remain on the floor for about 2 days.  On his initial physical examination blood pressure 132/99, heart rate 135, respiratory rate 18, oxygen saturation 100%, temperature 97.8.  He had dry mucous membranes, his lungs were clear to auscultation bilaterally, heart S1-S2, present rhythm, soft abdomen, no lower extremity edema. Sodium 125, potassium 4.7, chloride 89, bicarb 22, glucose 103, BUN 15, creatinine 2.40, white count 15.8, hemoglobin 7.5, hematocrit 20.9, platelets 159.  CK 666. Right pelvic x-rays with acute intertrochanteric fracture of the proximal right femur.  Ryan [name] received 1 unit packed red blood cells for anemia of chronic disease.  06/24 Ryan [name] underwent reduction internal fixation of close displaced intertrochanteric right hip fracture.  Assessment & Plan:   Principal Problem:   Closed right hip fracture (HCC) Active Problems:   Symptomatic anemia   Hyponatremia   Closed rib fracture   Leukocytosis   Rhabdomyolysis   Dehydration   Pain   1. Right hip fracture. Pain and mobility have improved but not yet back to baseline.   Continue pain control and physical therapy, pending final recommendations.   2. Symptomatic post operative anemia/ thrombocytopenia. Ryan [name] with chronic anemia, worsened after surgery.   Getting this am 2dn unit of PRBC for this hospitalization, will continue close follow up on Hgb and Hct. Keep Hgb above 7.   3. AKI with hyponatremia/ elevated CK. Renal function and  electrolytes have improved, will continue close monitoring. Ryan [name] is tolerating po well.   NA is 127, clinically euvolemic, will dc IV fluids and will follow renal panel in am.   CK below 3000 not qualify for rhabdomyolysis.   4. Abnormal liver enzymes. Has been stable, liver US pending result.    Status is: Inpatient  Remains inpatient appropriate because:IV treatments appropriate due to intensity of illness or inability to take PO   Dispo: The Ryan [name] is from: Home              Anticipated d/c is to: Home              Anticipated d/c date is: 1 day              Ryan [name] currently is not medically stable to d/c.   DVT prophylaxis: Enoxaparin   Code Status:   full  Family Communication:  No family at the bedside      Nutrition Status: Nutrition Problem: Increased nutrient needs Etiology: hip fracture Signs/Symptoms: estimated needs    Consultants:   orthopedics  Procedures:  06/24 Ryan [name] underwent reduction internal fixation of close displaced intertrochanteric right hip fracture     Subjective: Ryan [name] feeling better but not yet back to baseline, continue to be very weak and deconditioned, no nausea or vomiting.   Objective: Vitals:   04/03/20 0429 04/03/20 0752 04/03/20 1345 04/03/20 1503  BP: (!) 96/55 109/61 102/65 (!) 104/56  Pulse: 81 76 82 85  Resp: 18 16 16 16   Temp: 98.4 F (36.9 C) (!) 97.5 F (36.4 C)  98.1 F (36.7 C)  TempSrc: Oral  Oral  Oral  SpO2: 100% 100% 100% 100%  Weight:      Height:        Intake/Output Summary (Last 24 hours) at 04/03/2020 1503 Last data filed at 04/03/2020 1100 Gross per 24 hour  Intake 4117 ml  Output 625 ml  Net 3492 ml   Filed Weights   04/01/20 1709  Weight: 68 kg    Examination:   General: Not in pain or dyspnea, deconditioned  Neurology: Awake and alert, non focal  E ENT: mild pallor, no icterus, oral mucosa moist Cardiovascular: No JVD. S1-S2 present, rhythmic, no gallops, rubs, or murmurs. No  lower extremity edema. Pulmonary: positive breath sounds bilaterally, adequate air movement, no wheezing, rhonchi or rales. Gastrointestinal. Abdomen with no organomegaly, non tender, no rebound or guarding Skin. No rashes Musculoskeletal: no joint deformities     Data Reviewed: I have personally reviewed following labs and imaging studies  CBC: Recent Labs  Lab 04/01/20 2013 04/02/20 0527 04/02/20 1848 04/03/20 0520  WBC 15.8* 13.2* 13.8* 11.3*  NEUTROABS 12.4*  --   --   --   HGB 7.5* 7.6* 6.6* 5.6*  HCT 20.9* 21.8* 19.0* 16.6*  MCV 82.9 80.7 82.3 86.9  PLT 159 137* 120* 287*   Basic Metabolic Panel: Recent Labs  Lab 04/01/20 2013 04/01/20 2252 04/02/20 0527 04/03/20 0520  NA 125*  --  128* 127*  K 4.7  --  4.1 3.5  CL 89*  --  95* 98  CO2 22  --  22 23  GLUCOSE 103*  --  105* 99  BUN 15  --  15 13  CREATININE 2.40*  --  1.69* 1.20  CALCIUM 8.3*  --  7.8* 7.5*  MG  --  1.6* 2.0  --   PHOS  --  4.7*  --   --    GFR: Estimated Creatinine Clearance: 64.5 mL/min (by C-G formula based on SCr of 1.2 mg/dL). Liver Function Tests: Recent Labs  Lab 04/01/20 2013 04/03/20 0520  AST 61* 33  ALT 28 16  ALKPHOS 140* 94  BILITOT 1.4* 0.5  PROT 7.1 4.8*  ALBUMIN 3.9 2.6*   No results for input(s): LIPASE, AMYLASE in the last 168 hours. No results for input(s): AMMONIA in the last 168 hours. Coagulation Profile: Recent Labs  Lab 04/01/20 2252  INR 1.2   Cardiac Enzymes: Recent Labs  Lab 04/01/20 2013 04/02/20 0527  CKTOTAL 666* 460*   BNP (last 3 results) No results for input(s): PROBNP in the last 8760 hours. HbA1C: No results for input(s): HGBA1C in the last 72 hours. CBG: No results for input(s): GLUCAP in the last 168 hours. Lipid Profile: No results for input(s): CHOL, HDL, LDLCALC, TRIG, CHOLHDL, LDLDIRECT in the last 72 hours. Thyroid Function Tests: Recent Labs    04/03/20 0520  TSH 1.532   Anemia Panel: Recent Labs    04/01/20 2252    VITAMINB12 806  FOLATE 14.4  FERRITIN 226  TIBC 241*  IRON 19*  RETICCTPCT 4.1*      Radiology Studies: I have reviewed all of the imaging during this hospital visit personally     Scheduled Meds:  sodium chloride   Intravenous Once   sodium chloride   Intravenous Once   sodium chloride   Intravenous Once   acetaminophen  1,000 mg Oral Q6H   docusate sodium  100 mg Oral BID   enoxaparin (LOVENOX) injection  40 mg Subcutaneous Q24H   feeding supplement (ENSURE ENLIVE)  237 mL Oral TID BM   ketorolac  15 mg Intravenous Q6H   multivitamin with minerals  1 tablet Oral Daily   traMADol  50 mg Oral Q6H   Vitamin D (Ergocalciferol)  50,000 Units Oral Q7 days   Continuous Infusions:  sodium chloride 75 mL/hr at 04/02/20 1043   sodium chloride 75 mL/hr at 04/03/20 1223   lactated ringers 50 mL/hr at 04/02/20 1359     LOS: 2 days        Natara Monfort Gerome Apley, MD

## 2020-04-03 NOTE — Progress Notes (Signed)
PT Cancellation Note  Patient Details Name: Marvis Saefong MRN: [id number] DOB: [birth year]   Cancelled Treatment:    Reason Eval/Treat Not Completed: Medical issues which prohibited therapy Pt is getting blood transfusion at this time, PT held this date.  Will attempt initial exam tomorrow per pt medical appropriateness.  Kreg Shropshire, DPT 04/03/2020, 3:00 PM

## 2020-04-03 NOTE — Progress Notes (Signed)
Subjective: 1 Day Post-Op Procedure(s) (LRB): INTRAMEDULLARY (IM) NAIL INTERTROCHANTRIC (Right) Patient reports pain as mild.   Patient is well but does have history of chronic anemia, Hg 5.6 this AM PT and Care management to assist with discharge planning. Negative for chest pain and shortness of breath Fever: no Gastrointestinal:Negative for nausea and vomiting  Objective: Vital signs in last 24 hours: Temp:  [97.5 F (36.4 C)-98.4 F (36.9 C)] 97.5 F (36.4 C) (06/25 0752) Pulse Rate:  [66-90] 76 (06/25 0752) Resp:  [11-18] 16 (06/25 0752) BP: (93-136)/(53-76) 109/61 (06/25 0752) SpO2:  [96 %-100 %] 100 % (06/25 0752)  Intake/Output from previous day:  Intake/Output Summary (Last 24 hours) at 04/03/2020 1234 Last data filed at 04/03/2020 1100 Gross per 24 hour  Intake 4217 ml  Output 625 ml  Net 3592 ml    Intake/Output this shift: Total I/O In: 457 [P.O.:457] Out: 250 [Urine:250]  Labs: Recent Labs    04/01/20 2013 04/02/20 0527 04/02/20 1848 04/03/20 0520  HGB 7.5* 7.6* 6.6* 5.6*   Recent Labs    04/02/20 1848 04/03/20 0520  WBC 13.8* 11.3*  RBC 2.31* 1.91*  HCT 19.0* 16.6*  PLT 120* 124*   Recent Labs    04/02/20 0527 04/03/20 0520  NA 128* 127*  K 4.1 3.5  CL 95* 98  CO2 22 23  BUN 15 13  CREATININE 1.69* 1.20  GLUCOSE 105* 99  CALCIUM 7.8* 7.5*   Recent Labs    04/01/20 2252  INR 1.2     EXAM General - Patient is Alert, Appropriate and Oriented Extremity - ABD soft Sensation intact distally Intact pulses distally Dorsiflexion/Plantar flexion intact Incision: scant drainage No cellulitis present Dressing/Incision - blood tinged drainage to the most distal incision Motor Function - intact, moving foot and toes well on exam.  Abdomen soft with active bowel sounds.  Past Medical History:  Diagnosis Date  . Adenomatous colon polyp   . Anemia   . Depression   . Elevated LFTs     Assessment/Plan: 1 Day Post-Op Procedure(s)  (LRB): INTRAMEDULLARY (IM) NAIL INTERTROCHANTRIC (Right) Active Problems:   Closed right hip fracture (HCC)   Symptomatic anemia   Hyponatremia   Closed rib fracture   Leukocytosis   Rhabdomyolysis   Dehydration   Pain  Estimated body mass index is 17.79 kg/m as calculated from the following:   Height as of this encounter: 6\' 5"  (1.956 m).   Weight as of this encounter: 68 kg. Advance diet Up with therapy D/C IV fluids when tolerating po intake.  Labs reviewed, Hg 5.6.  History of chronic anemia. Per hospitalist note from yesterday, transfuse if below 7, will place order for blood transfusion. Continue to work on BM. Up with therapy when able.  DVT Prophylaxis - Lovenox, Foot Pumps and TED hose Weight-Bearing as tolerated to right leg  J. Cameron Proud, PA-C [facility] Orthopaedic Surgery 04/03/2020, 12:34 PM

## 2020-04-03 NOTE — Progress Notes (Signed)
PT Cancellation Note  Patient Details Name: Ryan [name] MRN: [id number] DOB: 1961/01/10   Cancelled Treatment:    Reason Eval/Treat Not Completed: Medical issues which prohibited therapy Pt does have history of chronic anemia, however yesterday AM Hemoglobin was 7.6,PM was 6.6 and this AM (6/25) was 5.6.  Spoke with nursing and she was unsure if he was to get another transfusion.  Will hold PT this AM and re-assess his situation and lab values, hope to see this PM if appropriate.  Kreg Shropshire, DPT 04/03/2020, 10:04 AM

## 2020-04-04 ENCOUNTER — Inpatient Hospital Stay: Payer: Self-pay

## 2020-04-04 LAB — PROTEIN ELECTROPHORESIS, SERUM
A/G Ratio: 1.3 (ref 0.7–1.7)
Albumin ELP: 3.2 g/dL (ref 2.9–4.4)
Alpha-1-Globulin: 0.2 g/dL (ref 0.0–0.4)
Alpha-2-Globulin: 0.5 g/dL (ref 0.4–1.0)
Beta Globulin: 0.7 g/dL (ref 0.7–1.3)
Gamma Globulin: 1 g/dL (ref 0.4–1.8)
Globulin, Total: 2.4 g/dL (ref 2.2–3.9)
Total Protein ELP: 5.6 g/dL — ABNORMAL LOW (ref 6.0–8.5)

## 2020-04-04 LAB — TYPE AND SCREEN
ABO/RH(D): O POS
Antibody Screen: NEGATIVE
Unit division: 0
Unit division: 0

## 2020-04-04 LAB — CBC WITH DIFFERENTIAL/PLATELET
Abs Immature Granulocytes: 0.23 10*3/uL — ABNORMAL HIGH (ref 0.00–0.07)
Basophils Absolute: 0 10*3/uL (ref 0.0–0.1)
Basophils Relative: 0 %
Eosinophils Absolute: 0 10*3/uL (ref 0.0–0.5)
Eosinophils Relative: 0 %
HCT: 20.9 % — ABNORMAL LOW (ref 39.0–52.0)
Hemoglobin: 7 g/dL — ABNORMAL LOW (ref 13.0–17.0)
Immature Granulocytes: 2 %
Lymphocytes Relative: 11 %
Lymphs Abs: 1.2 10*3/uL (ref 0.7–4.0)
MCH: 29.9 pg (ref 26.0–34.0)
MCHC: 33.5 g/dL (ref 30.0–36.0)
MCV: 89.3 fL (ref 80.0–100.0)
Monocytes Absolute: 2 10*3/uL — ABNORMAL HIGH (ref 0.1–1.0)
Monocytes Relative: 18 %
Neutro Abs: 7.5 10*3/uL (ref 1.7–7.7)
Neutrophils Relative %: 69 %
Platelets: 171 10*3/uL (ref 150–400)
RBC: 2.34 MIL/uL — ABNORMAL LOW (ref 4.22–5.81)
RDW: 14.6 % (ref 11.5–15.5)
WBC: 11.1 10*3/uL — ABNORMAL HIGH (ref 4.0–10.5)
nRBC: 0 % (ref 0.0–0.2)

## 2020-04-04 LAB — BASIC METABOLIC PANEL
Anion gap: 8 (ref 5–15)
BUN: 9 mg/dL (ref 6–20)
CO2: 24 mmol/L (ref 22–32)
Calcium: 8 mg/dL — ABNORMAL LOW (ref 8.9–10.3)
Chloride: 95 mmol/L — ABNORMAL LOW (ref 98–111)
Creatinine, Ser: 0.9 mg/dL (ref 0.61–1.24)
GFR calc Af Amer: >60 mL/min (ref >60)
GFR calc non Af Amer: >60 mL/min (ref >60)
Glucose, Bld: 112 mg/dL — ABNORMAL HIGH (ref 70–99)
Potassium: 3.6 mmol/L (ref 3.5–5.1)
Sodium: 127 mmol/L — ABNORMAL LOW (ref 135–145)

## 2020-04-04 LAB — BPAM RBC
Blood Product Expiration Date: 202107222359
Blood Product Expiration Date: 202107232359
ISSUE DATE / TIME: 202106232230
ISSUE DATE / TIME: 202106251511
Unit Type and Rh: 5100
Unit Type and Rh: 5100

## 2020-04-04 LAB — PARATHYROID HORMONE, INTACT (NO CA): PTH: 63 pg/mL (ref 15–65)

## 2020-04-04 MED ORDER — DICLOFENAC SODIUM 1 % EX GEL
2.0000 g | Freq: Four times a day (QID) | CUTANEOUS | Status: DC
  Administered 2020-04-04 – 2020-04-06 (×6): 2 g via TOPICAL
  Filled 2020-04-04: qty 100

## 2020-04-04 MED ORDER — SODIUM CHLORIDE 0.9 % IV SOLN
510.0000 mg | Freq: Once | INTRAVENOUS | Status: AC
  Administered 2020-04-04: 510 mg via INTRAVENOUS
  Filled 2020-04-04: qty 17

## 2020-04-04 NOTE — Evaluation (Signed)
Physical Therapy Evaluation Patient Details Name: Ryan [name] MRN: [id number] DOB: 1961-01-08 Today's Date: 04/04/2020   History of Present Illness  a 59 y.o. male with a history of depression and chronic anemia.  He fell at home and had signficant R hip pain 3 days prior to coming to the hospital.  Found to have a femur fracture and is now s/p ORIF 6/24.  Not seen POD1 secondary to hemoglobin in the 5s and needing transfusion.  Clinical Impression  Pt with some pain and weakness as expected post op, able to manage ~40 ft of gait training and participate with assisted exercises but struggled to really show a lot of independence/confidence with most mobility/ambulation.  Pt with very slow, labored gait with heavy reliance on UEs during R stance phase.  Good effort and motivation t/o session but more limited than expected given his eagerness to get home and back to work.    Follow Up Recommendations Home health PT;Supervision - Intermittent (per continued progress)    Equipment Recommendations  Rolling walker with 5" wheels;3in1 (PT)    Recommendations for Other Services       Precautions / Restrictions Precautions Precautions: Fall Restrictions Weight Bearing Restrictions: Yes RLE Weight Bearing: Weight bearing as tolerated      Mobility  Bed Mobility Overal bed mobility: Needs Assistance Bed Mobility: Supine to Sit     Supine to sit: Min assist     General bed mobility comments: Pt reliant on b/l UEs to get R LE to EOB, cuing and light direct assist to get to appropriate, EOB sitting   Transfers Overall transfer level: Needs assistance Equipment used: Rolling walker (2 wheeled) Transfers: Sit to/from Stand Sit to Stand: Min assist;From elevated surface         General transfer comment: Pt struggled to get to standing and required plenty of cuing, light assist and very heavy reliance on UEs.    Ambulation/Gait Ambulation/Gait assistance: Min assist Gait Distance  (Feet): 40 Feet Assistive device: Rolling walker (2 wheeled)       General Gait Details: Pt is able to ambulate with very labored, slow and cautious cadence.  He showed good effort but was clearly pain limited and hesitant to take full weight on the R.  Pt's O2 remained in the 90s and HR held steady ~120s t/o the effort.  Stairs            Wheelchair Mobility    Modified Rankin (Stroke Patients Only)       Balance Overall balance assessment: Modified Independent                                           Pertinent Vitals/Pain Pain Assessment: 0-10 Pain Score: 4  Pain Location: R thigh    Home Living Family/patient expects to be discharged to:: Private residence Living Arrangements: Alone Available Help at Discharge: Neighbor   Home Access: Level entry     Home Layout: One level Home Equipment: None      Prior Function Level of Independence: Independent         Comments: Pt drives, works, runs errands, manages home, etc w/o assist     Journalist, newspaper        Extremity/Trunk Assessment   Upper Extremity Assessment Upper Extremity Assessment: Overall WFL for tasks assessed    Lower Extremity Assessment Lower Extremity Assessment: Generalized weakness (no R  SLRs, minimal AROM, L WNL)       Communication   Communication: No difficulties  Cognition Arousal/Alertness: Awake/alert Behavior During Therapy: WFL for tasks assessed/performed Overall Cognitive Status: Within Functional Limits for tasks assessed                                        General Comments      Exercises General Exercises - Lower Extremity Ankle Circles/Pumps: AROM;10 reps Quad Sets: Strengthening;10 reps Short Arc Quad: 10 reps;AROM Heel Slides: AAROM;10 reps (tolerates knee flexion to ~70, resisted leg extension) Hip ABduction/ADduction: 10 reps;AAROM (unable to do AROM) Straight Leg Raises: AAROM;10 reps (c/o pain with all against  gravity effort)   Assessment/Plan    PT Assessment Patient needs continued PT services  PT Problem List Decreased strength;Decreased range of motion;Decreased activity tolerance;Decreased mobility;Decreased balance;Decreased coordination;Decreased knowledge of use of DME;Decreased safety awareness;Pain       PT Treatment Interventions Gait training;DME instruction;Stair training;Functional mobility training;Therapeutic activities;Therapeutic exercise;Balance training;Neuromuscular re-education;Patient/family education    PT Goals (Current goals can be found in the Care Plan section)  Acute Rehab PT Goals Patient Stated Goal: 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) PT Goal Formulation: With patient Time For Goal Achievement: 04/18/20 Potential to Achieve Goals: Good    Frequency BID   Barriers to discharge        Co-evaluation               AM-PAC PT "6 Clicks" Mobility  Outcome Measure Help needed turning from your back to your side while in a flat bed without using bedrails?: A Little Help needed moving from lying on your back to sitting on the side of a flat bed without using bedrails?: A Little Help needed moving to and from a bed to a chair (including a wheelchair)?: A Little Help needed standing up from a chair using your arms (e.g., wheelchair or bedside chair)?: A Little Help needed to walk in hospital room?: A Little Help needed climbing 3-5 steps with a railing? : A Little 6 Click Score: 18    End of Session Equipment Utilized During Treatment: Gait belt Activity Tolerance: Patient limited by pain;Patient limited by fatigue Patient left: with chair alarm set;with call bell/phone within reach Nurse Communication: Mobility status PT Visit Diagnosis: Muscle weakness (generalized) (M62.81);Difficulty in walking, not elsewhere classified (R26.2);Pain Pain - Right/Left: Right Pain - part of body: Hip    Time: 0815-0900 PT Time Calculation (min)  (ACUTE ONLY): 45 min   Charges:   PT Evaluation $PT Eval Low Complexity: 1 Low PT Treatments $Gait Training: 8-22 mins $Therapeutic Exercise: 8-22 mins        Kreg Shropshire, DPT 04/04/2020, 10:53 AM

## 2020-04-04 NOTE — Progress Notes (Signed)
PROGRESS NOTE    Brittain Hosie  IRJ:[id number] DOB: [birth year] DOA: 04/01/2020 PCP: Patient, No Pcp Per    Brief Narrative:  Patient admitted to the hospital with a working diagnosis of right intertrochanteric fracture.  59 year old male who presented with right leg pain.  He has a significant past medical history for anemia and depression.  Patient tripped and fell from his own height, no loss of consciousness but significant right leg pain, patient unable to stand and remain on the floor for about 2 days.  On his initial physical examination blood pressure 132/99, heart rate 135, respiratory rate 18, oxygen saturation 100%, temperature 97.8.  He had dry mucous membranes, his lungs were clear to auscultation bilaterally, heart S1-S2, present rhythm, soft abdomen, no lower extremity edema. Sodium 125, potassium 4.7, chloride 89, bicarb 22, glucose 103, BUN 15, creatinine 2.40, white count 15.8, hemoglobin 7.5, hematocrit 20.9, platelets 159.  CK 666. Right pelvic x-rays with acute intertrochanteric fracture of the proximal right femur.  Patient received 2 unit packed red blood cells for anemia of chronic disease exacerbated post surgical procedure and hospitalization.   06/24 patient underwent reduction internal fixation of close displaced intertrochanteric right hip fracture.   Assessment & Plan:   Principal Problem:   Closed right hip fracture (HCC) Active Problems:   Symptomatic anemia   Hyponatremia   Closed rib fracture   Leukocytosis   Rhabdomyolysis   Dehydration   Pain   1. Right hip fracture. Continue to have poor mobility. His right knee is more tender and edematous today.    Will add topical diclofenac and noted orthopedics recommendations for possible joint drainage.  Pain control with oral analgesics and continue to follow up PT / OT recommendations.   2. Symptomatic post operative anemia/ thrombocytopenia/ iron deficency. Patient sp  2dn unit of PRBC. Hgb this  am is 7.0. Serum iron 19, TIBC 241, Transferrin saturation is 8.   Add one dose of IV iron and follow up levels as outpatient.   Will follow up cell count in am.   3. AKI with hyponatremia/ elevated CK/ hyponatremia. Stable renal function with serum cr at 0,90 with K at 3,6 and serum bicarbonate at 24. NA is down to 127.   Patient is tolerating po well. Will discontinue IV fluids, clinically is euvolemic.   CK below 3000 not qualify for rhabdomyolysis.   4. Abnormal liver enzymes. US liver with steatosis, LFT have normalized/     Status is: Inpatient  Remains inpatient appropriate because:Ongoing active pain requiring inpatient pain management   Dispo: The patient is from: Home              Anticipated d/c is to: Home              Anticipated d/c date is: 1 day              Patient currently is not medically stable to d/c.   DVT prophylaxis: Enoxaparin   Code Status:   full  Family Communication:  I spoke with patient's aunt at the bedside, we talked in detail about patient's condition, plan of care and prognosis and all questions were addressed.      Nutrition Status: Nutrition Problem: Increased nutrient needs Etiology: hip fracture Signs/Symptoms: estimated needs       Skin Documentation:     Consultants:   Orthopedics   Procedures:  reduction internal fixation of close displaced intertrochanteric right hip fracture.     Subjective: Patient is feeling  better but continue to be very weak and deconditioned, no nausea or vomiting,. Today with pain at the right knee worse with movement,   Objective: Vitals:   04/03/20 1543 04/03/20 1859 04/03/20 2341 04/04/20 0731  BP: 106/69 101/68 100/73 106/69  Pulse: 79 82 91 84  Resp: 16 16 16 16   Temp: 98.1 F (36.7 C) 98.1 F (36.7 C) 98 F (36.7 C) 99.8 F (37.7 C)  TempSrc: Oral Oral Oral Oral  SpO2: 100% 100% 100% 95%  Weight:      Height:        Intake/Output Summary (Last 24 hours) at 04/04/2020  1405 Last data filed at 04/04/2020 1009 Gross per 24 hour  Intake 2875.98 ml  Output 1745 ml  Net 1130.98 ml   Filed Weights   04/01/20 1709  Weight: 68 kg    Examination:   General: Not in pain or dyspnea, deconditioned  Neurology: Awake and alert, non focal  E ENT: no pallor, no icterus, oral mucosa moist Cardiovascular: No JVD. S1-S2 present, rhythmic, no gallops, rubs, or murmurs. No lower extremity edema. Pulmonary: positive breath sounds bilaterally, adequate air movement, no wheezing, rhonchi or rales. Gastrointestinal. Abdomen with no organomegaly, non tender, no rebound or guarding Skin. No rashes Musculoskeletal: right knee with edema and decreased range of motion, not tender or warm.      Data Reviewed: I have personally reviewed following labs and imaging studies  CBC: Recent Labs  Lab 04/01/20 2013 04/01/20 2013 04/02/20 0527 04/02/20 1848 04/03/20 0520 04/03/20 2028 04/04/20 1001  WBC 15.8*  --  13.2* 13.8* 11.3*  --  11.1*  NEUTROABS 12.4*  --   --   --   --   --  7.5  HGB 7.5*   < > 7.6* 6.6* 5.6* 7.7* 7.0*  HCT 20.9*   < > 21.8* 19.0* 16.6* 22.9* 20.9*  MCV 82.9  --  80.7 82.3 86.9  --  89.3  PLT 159  --  137* 120* 124*  --  171   < > = values in this interval not displayed.   Basic Metabolic Panel: Recent Labs  Lab 04/01/20 2013 04/01/20 2252 04/02/20 0527 04/03/20 0520 04/03/20 1630 04/04/20 1001  NA 125*  --  128* 127* 128* 127*  K 4.7  --  4.1 3.5 3.7 3.6  CL 89*  --  95* 98 97* 95*  CO2 22  --  22 23 24 24   GLUCOSE 103*  --  105* 99 92 112*  BUN 15  --  15 13 12 9   CREATININE 2.40*  --  1.69* 1.20 0.98 0.90  CALCIUM 8.3*  --  7.8* 7.5* 7.8* 8.0*  MG  --  1.6* 2.0  --   --   --   PHOS  --  4.7*  --   --   --   --    GFR: Estimated Creatinine Clearance: 86 mL/min (by C-G formula based on SCr of 0.9 mg/dL). Liver Function Tests: Recent Labs  Lab 04/01/20 2013 04/03/20 0520  AST 61* 33  ALT 28 16  ALKPHOS 140* 94  BILITOT  1.4* 0.5  PROT 7.1 4.8*  ALBUMIN 3.9 2.6*   No results for input(s): LIPASE, AMYLASE in the last 168 hours. No results for input(s): AMMONIA in the last 168 hours. Coagulation Profile: Recent Labs  Lab 04/01/20 2252  INR 1.2   Cardiac Enzymes: Recent Labs  Lab 04/01/20 2013 04/02/20 0527  CKTOTAL 666* 460*   BNP (last  3 results) No results for input(s): PROBNP in the last 8760 hours. HbA1C: No results for input(s): HGBA1C in the last 72 hours. CBG: No results for input(s): GLUCAP in the last 168 hours. Lipid Profile: No results for input(s): CHOL, HDL, LDLCALC, TRIG, CHOLHDL, LDLDIRECT in the last 72 hours. Thyroid Function Tests: Recent Labs    04/03/20 0520  TSH 1.532   Anemia Panel: Recent Labs    04/01/20 2252  VITAMINB12 806  FOLATE 14.4  FERRITIN 226  TIBC 241*  IRON 19*  RETICCTPCT 4.1*      Radiology Studies: I have reviewed all of the imaging during this hospital visit personally     Scheduled Meds: . sodium chloride   Intravenous Once  . sodium chloride   Intravenous Once  . docusate sodium  100 mg Oral BID  . enoxaparin (LOVENOX) injection  40 mg Subcutaneous Q24H  . feeding supplement (ENSURE ENLIVE)  237 mL Oral TID BM  . multivitamin with minerals  1 tablet Oral Daily  . traMADol  50 mg Oral Q6H  . Vitamin D (Ergocalciferol)  50,000 Units Oral Q7 days   Continuous Infusions: . sodium chloride 75 mL/hr at 04/02/20 1043  . sodium chloride 75 mL/hr at 04/04/20 0601     LOS: 3 days        Ubah Radke Gerome Apley, MD

## 2020-04-04 NOTE — Progress Notes (Addendum)
Subjective: 2 Days Post-Op Procedure(s) (LRB): INTRAMEDULLARY (IM) NAIL INTERTROCHANTRIC (Right) Patient reports pain as moderate.   Patient is well, underwent transfusion yesterday, post transfusion Hg 7.7 PT and Care management to assist with discharge planning. Negative for chest pain and shortness of breath Fever: no Gastrointestinal:Negative for nausea and vomiting Patient was unable to work with PT yesterday due to undergoing blood transfusion, will start with PT today.  Objective: Vital signs in last 24 hours: Temp:  [98 F (36.7 C)-99.8 F (37.7 C)] 99.8 F (37.7 C) (06/26 0731) Pulse Rate:  [79-91] 84 (06/26 0731) Resp:  [16] 16 (06/26 0731) BP: (100-106)/(56-73) 106/69 (06/26 0731) SpO2:  [95 %-100 %] 95 % (06/26 0731)  Intake/Output from previous day:  Intake/Output Summary (Last 24 hours) at 04/04/2020 0905 Last data filed at 04/04/2020 0835 Gross per 24 hour  Intake 3092.98 ml  Output 1995 ml  Net 1097.98 ml    Intake/Output this shift: Total I/O In: 112.5 [I.V.:112.5] Out: 370 [Urine:370]  Labs: Recent Labs    04/01/20 2013 04/02/20 0527 04/02/20 1848 04/03/20 0520 04/03/20 2028  HGB 7.5* 7.6* 6.6* 5.6* 7.7*   Recent Labs    04/02/20 1848 04/02/20 1848 04/03/20 0520 04/03/20 2028  WBC 13.8*  --  11.3*  --   RBC 2.31*  --  1.91*  --   HCT 19.0*   < > 16.6* 22.9*  PLT 120*  --  124*  --    < > = values in this interval not displayed.   Recent Labs    04/03/20 0520 04/03/20 1630  NA 127* 128*  K 3.5 3.7  CL 98 97*  CO2 23 24  BUN 13 12  CREATININE 1.20 0.98  GLUCOSE 99 92  CALCIUM 7.5* 7.8*   Recent Labs    04/01/20 2252  INR 1.2   EXAM General - Patient is Alert, Appropriate and Oriented Extremity - ABD soft Sensation intact distally Intact pulses distally Dorsiflexion/Plantar flexion intact Incision: scant drainage No cellulitis present  Moderate right knee effusion, no abrasions or open wound. Able to perform a straight  leg raise.  Able to flex 60 degrees with moderate discomfort.  Non tender to palpation to the knee, no defect noted to the quad or patella tendon. Dressing/Incision - blood tinged drainage to the most distal incision Motor Function - intact, moving foot and toes well on exam.  Abdomen soft with active bowel sounds.  Past Medical History:  Diagnosis Date  . Adenomatous colon polyp   . Anemia   . Depression   . Elevated LFTs     Assessment/Plan: 2 Days Post-Op Procedure(s) (LRB): INTRAMEDULLARY (IM) NAIL INTERTROCHANTRIC (Right) Principal Problem:   Closed right hip fracture (HCC) Active Problems:   Symptomatic anemia   Hyponatremia   Closed rib fracture   Leukocytosis   Rhabdomyolysis   Dehydration   Pain  Estimated body mass index is 17.79 kg/m as calculated from the following:   Height as of this encounter: 6\' 5"  (1.956 m).   Weight as of this encounter: 68 kg. Advance diet Up with therapy D/C IV fluids when tolerating po intake.  Labs pending, Hg after transfusion was 7.7. Patient with moderate knee effusion, x-ray of the knee ordered.  Recommend ice and elevation.  If continued swelling can perform aspiration with possible steroid injection tomorrow. Continue to work on BM. Continue with PT.  DVT Prophylaxis - Lovenox, Foot Pumps and TED hose Weight-Bearing as tolerated to right leg  J. Cameron Proud, PA-C  [facility] Orthopaedic Surgery 04/04/2020, 9:05 AM

## 2020-04-04 NOTE — Progress Notes (Signed)
Patient complaining of some right knee pain. [place] PA notified, PA will assess patient.

## 2020-04-05 LAB — BASIC METABOLIC PANEL
Anion gap: 5 (ref 5–15)
BUN: 7 mg/dL (ref 6–20)
CO2: 26 mmol/L (ref 22–32)
Calcium: 8.2 mg/dL — ABNORMAL LOW (ref 8.9–10.3)
Chloride: 101 mmol/L (ref 98–111)
Creatinine, Ser: 0.76 mg/dL (ref 0.61–1.24)
GFR calc Af Amer: >60 mL/min (ref >60)
GFR calc non Af Amer: >60 mL/min (ref >60)
Glucose, Bld: 95 mg/dL (ref 70–99)
Potassium: 3.7 mmol/L (ref 3.5–5.1)
Sodium: 132 mmol/L — ABNORMAL LOW (ref 135–145)

## 2020-04-05 LAB — CBC
HCT: 19.5 % — ABNORMAL LOW (ref 39.0–52.0)
Hemoglobin: 6.5 g/dL — ABNORMAL LOW (ref 13.0–17.0)
MCH: 29.8 pg (ref 26.0–34.0)
MCHC: 33.3 g/dL (ref 30.0–36.0)
MCV: 89.4 fL (ref 80.0–100.0)
Platelets: 189 10*3/uL (ref 150–400)
RBC: 2.18 MIL/uL — ABNORMAL LOW (ref 4.22–5.81)
RDW: 14.6 % (ref 11.5–15.5)
WBC: 9.8 10*3/uL (ref 4.0–10.5)
nRBC: 0 % (ref 0.0–0.2)

## 2020-04-05 LAB — PREPARE RBC (CROSSMATCH)

## 2020-04-05 MED ORDER — SODIUM CHLORIDE 0.9% IV SOLUTION: Freq: Once | INTRAVENOUS | Status: AC

## 2020-04-05 NOTE — Progress Notes (Signed)
PT Cancellation Note  Patient Details Name: Ryan [name] MRN: [id number] DOB: [birth year]   Cancelled Treatment:     Pt receiving blood again this afternoon, scheduled to finish around 1600, hope to be able to do appropriately limited PT session at that point.     Kreg Shropshire, DPT 04/05/2020, 2:36 PM

## 2020-04-05 NOTE — Progress Notes (Signed)
Pt called this nurse to the room because his dressing on lower thigh was soaked with blood. NP is aware and assess site. Dressing was changed and been monitored. New dressing currently is >60% soaked with blood when last assessed. Will continue to monitor pt.

## 2020-04-05 NOTE — Progress Notes (Signed)
PROGRESS NOTE    Ryan [name]  WVP:[id number] DOB: January 27, 1961 DOA: 04/01/2020 PCP: Patient, No Pcp Per    Brief Narrative:  Patient admitted to the hospital with a working diagnosis of right intertrochanteric fracture.  59 year old male who presented with right leg pain. He has a significant past medical history for anemia and depression. Patient tripped and fell from his own height, no loss of consciousness but significant right leg pain, patient unable to stand and remain on the floor for about 2 days. On his initial physical examination blood pressure 132/99, heart rate 135, respiratory rate 18, oxygen saturation 100%, temperature 97.8. He had dry mucous membranes, his lungs were clear to auscultation bilaterally, heart S1-S2, present rhythm, soft abdomen, no lower extremity edema. Sodium 125, potassium 4.7, chloride 89, bicarb 22, glucose 103, BUN 15, creatinine 2.40, white count 15.8, hemoglobin 7.5, hematocrit 20.9, platelets 159. CK 666. Right pelvic x-rays with acute intertrochanteric fracture of the proximal right femur.  Patient received 2 unit packed red blood cells for anemia of chronic disease exacerbated post surgical procedure and hospitalization.   06/24patient underwent reduction internal fixation of close displaced intertrochanteric right hip fracture.   Assessment & Plan:   Principal Problem:   Closed right hip fracture (HCC) Active Problems:   Symptomatic anemia   Hyponatremia   Closed rib fracture   Leukocytosis   Rhabdomyolysis   Dehydration   Pain    1. Right hip fracture. Patient has been working with physical therapy, continue to make progress but continue to be very weak and deconditioned. His right knee pain has improved.   Continue with topical topical diclofenac to right knee along with    oral analgesics. Follow up PT / OT recommendations.   2. Symptomatic post operative anemia/ thrombocytopenia/ iron deficency. Patient sp  2dn unit of  PRBC.Serum iron 19, TIBC 241, Transferrin saturation is 8. Sp IV iron x1.   Cell count below 7 this am, at 6.5. no signs of acute bleeding, will add PRBC transfusion for today, #3 since admission. Follow up H&H in am.    3. AKI with hyponatremia/ elevated CK/ hyponatremia. Serum Na improved up to 132, K is 3,7 and serum bicarbonate at 26 with cr at 0,76.   CK below 3000 not qualify for rhabdomyolysis.   4. Abnormal liver enzymes. US liver with steatosis, LFT have normalized   Status is: Inpatient  Remains inpatient appropriate because:Hemodynamically unstable and IV treatments appropriate due to intensity of illness or inability to take PO   Dispo: The patient is from: Home              Anticipated d/c is to: Home              Anticipated d/c date is: 1 day              Patient currently is not medically stable to d/c. If patient continue to improve and HGb stable, for possible dc home in am.     DVT prophylaxis: Enoxaparin   Code Status:   full  Family Communication:  No family at the bedside      Nutrition Status: Nutrition Problem: Increased nutrient needs Etiology: hip fracture Signs/Symptoms: estimated needs     Consultants:   Orthopedics   Procedures:  reduction internal fixation of close displaced intertrochanteric right hip fracture.     Subjective: Patient with improved right knee pain, mobility has improved, but continue to be very weak and deconditioned.   Objective: Vitals:  04/05/20 0824 04/05/20 1237 04/05/20 1308 04/05/20 1309  BP: 120/62 118/71 117/61 117/61  Pulse: 92 94 96 96  Resp: 16 18 18 18   Temp: 98 F (36.7 C) 98 F (36.7 C) 97.9 F (36.6 C) 97.9 F (36.6 C)  TempSrc: Oral Oral Oral Oral  SpO2: 100% 100% 100% 100%  Weight:      Height:        Intake/Output Summary (Last 24 hours) at 04/05/2020 1354 Last data filed at 04/05/2020 1346 Gross per 24 hour  Intake 1347.93 ml  Output 3175 ml  Net -1827.07 ml   Filed  Weights   04/01/20 1709  Weight: 68 kg    Examination:   General: Not in pain or dyspnea  Neurology: Awake and alert, non focal  E ENT: mild pallor, no icterus, oral mucosa moist Cardiovascular: No JVD. S1-S2 present, rhythmic, no gallops, rubs, or murmurs. No lower extremity edema. Pulmonary: positive breath sounds bilaterally, adequate air movement, no wheezing, rhonchi or rales. Gastrointestinal. Abdomen with  no organomegaly, non tender, no rebound or guarding Skin. No rashes Musculoskeletal: right knee hypertrophy, no edema, or erythema.      Data Reviewed: I have personally reviewed following labs and imaging studies  CBC: Recent Labs  Lab 04/01/20 2013 04/01/20 2013 04/02/20 1610 04/02/20 0527 04/02/20 1848 04/03/20 0520 04/03/20 2028 04/04/20 1001 04/05/20 0628  WBC 15.8*   < > 13.2*  --  13.8* 11.3*  --  11.1* 9.8  NEUTROABS 12.4*  --   --   --   --   --   --  7.5  --   HGB 7.5*   < > 7.6*   < > 6.6* 5.6* 7.7* 7.0* 6.5*  HCT 20.9*   < > 21.8*   < > 19.0* 16.6* 22.9* 20.9* 19.5*  MCV 82.9   < > 80.7  --  82.3 86.9  --  89.3 89.4  PLT 159   < > 137*  --  120* 124*  --  171 189   < > = values in this interval not displayed.   Basic Metabolic Panel: Recent Labs  Lab 04/01/20 2013 04/01/20 2252 04/02/20 0527 04/03/20 0520 04/03/20 1630 04/04/20 1001 04/05/20 0628  NA   < >  --  128* 127* 128* 127* 132*  K   < >  --  4.1 3.5 3.7 3.6 3.7  CL   < >  --  95* 98 97* 95* 101  CO2   < >  --  22 23 24 24 26   GLUCOSE   < >  --  105* 99 92 112* 95  BUN   < >  --  15 13 12 9 7   CREATININE   < >  --  1.69* 1.20 0.98 0.90 0.76  CALCIUM   < >  --  7.8* 7.5* 7.8* 8.0* 8.2*  MG  --  1.6* 2.0  --   --   --   --   PHOS  --  4.7*  --   --   --   --   --    < > = values in this interval not displayed.   GFR: Estimated Creatinine Clearance: 96.8 mL/min (by C-G formula based on SCr of 0.76 mg/dL). Liver Function Tests: Recent Labs  Lab 04/01/20 2013 04/03/20 0520    AST 61* 33  ALT 28 16  ALKPHOS 140* 94  BILITOT 1.4* 0.5  PROT 7.1 4.8*  ALBUMIN 3.9 2.6*  No results for input(s): LIPASE, AMYLASE in the last 168 hours. No results for input(s): AMMONIA in the last 168 hours. Coagulation Profile: Recent Labs  Lab 04/01/20 2252  INR 1.2   Cardiac Enzymes: Recent Labs  Lab 04/01/20 2013 04/02/20 0527  CKTOTAL 666* 460*   BNP (last 3 results) No results for input(s): PROBNP in the last 8760 hours. HbA1C: No results for input(s): HGBA1C in the last 72 hours. CBG: No results for input(s): GLUCAP in the last 168 hours. Lipid Profile: No results for input(s): CHOL, HDL, LDLCALC, TRIG, CHOLHDL, LDLDIRECT in the last 72 hours. Thyroid Function Tests: Recent Labs    04/03/20 0520  TSH 1.532   Anemia Panel: No results for input(s): VITAMINB12, FOLATE, FERRITIN, TIBC, IRON, RETICCTPCT in the last 72 hours.    Radiology Studies: I have reviewed all of the imaging during this hospital visit personally     Scheduled Meds: . sodium chloride   Intravenous Once  . sodium chloride   Intravenous Once  . diclofenac Sodium  2 g Topical QID  . docusate sodium  100 mg Oral BID  . enoxaparin (LOVENOX) injection  40 mg Subcutaneous Q24H  . feeding supplement (ENSURE ENLIVE)  237 mL Oral TID BM  . multivitamin with minerals  1 tablet Oral Daily  . traMADol  50 mg Oral Q6H  . Vitamin D (Ergocalciferol)  50,000 Units Oral Q7 days   Continuous Infusions: . sodium chloride 75 mL/hr at 04/02/20 1043  . sodium chloride Stopped (04/04/20 1731)     LOS: 4 days        Ryan Ballin Gerome Apley, MD

## 2020-04-05 NOTE — Progress Notes (Signed)
Physical Therapy Treatment Patient Details Name: Ryan [name] MRN: [id number] DOB: 01/31/1961 Today's Date: 04/05/2020    History of Present Illness 59 y.o. male with a history of depression and chronic anemia.  He fell at home and had signficant R hip pain 3 days prior to coming to the hospital.  Found to have a femur fracture and is now s/p ORIF 6/24.  Not seen POD1 secondary to hemoglobin in the 5s and needing transfusion.    PT Comments    Attempted to see pt just after end of transfusion, needing another bandage change (saturated with blood), on return he had his dinner but was eager to do some activity and as he reports doing the LE exercise with good regularity t/o the day we opted for a prolonged bout of ambulation.  He was able to fully circumambulate the nurses' station with slow but relatively consistent and safe ambulation.  He remains highly reliant on UEs during R WBing and nearly the entire effort was stop-go with walker WBing but he had no LOBs, vitals remained WNL and pt reports feeling more confident with ambulation today than pervious sessions.       Follow Up Recommendations  Home health PT;Supervision - Intermittent     Equipment Recommendations  Rolling walker with 5" wheels;3in1 (PT)    Recommendations for Other Services       Precautions / Restrictions Precautions Precautions: Fall Restrictions RLE Weight Bearing: Weight bearing as tolerated    Mobility  Bed Mobility Overal bed mobility: Modified Independent Bed Mobility: Supine to Sit     Supine to sit: Min guard     General bed mobility comments: less self assist needed for R LE to EOB, still unable to do full AROM with LE only  Transfers Overall transfer level: Modified independent Equipment used: Rolling walker (2 wheeled) Transfers: Sit to/from Stand Sit to Stand: Min guard         General transfer comment: Pt able to rise and return to sitting on lower surface with less cuing and effort,  still highl reliant on UEs, but confident  Ambulation/Gait Ambulation/Gait assistance: Min guard Gait Distance (Feet): 200 Feet Assistive device: Rolling walker (2 wheeled)       General Gait Details: Pt with more consistent cadence, but still needing to stop walker during R WBing, light cuing from PT to maintain walker momentum improves cadence but still unable to smoothly step through while maintaining momentum   Stairs Stairs:  (has 1 small step, needs to trial tomorrow)           Wheelchair Mobility    Modified Rankin (Stroke Patients Only)       Balance Overall balance assessment: Modified Independent (reliant on at least 1 UE to maintain standing balance)                                          Cognition Arousal/Alertness: Awake/alert Behavior During Therapy: WFL for tasks assessed/performed Overall Cognitive Status: Within Functional Limits for tasks assessed                                        Exercises      General Comments        Pertinent Vitals/Pain Pain Assessment: 0-10 Pain Score: 5  Pain Location: R  thigh    Home Living                      Prior Function            PT Goals (current goals can now be found in the care plan section) Progress towards PT goals: Progressing toward goals    Frequency    BID      PT Plan Current plan remains appropriate    Co-evaluation              AM-PAC PT "6 Clicks" Mobility   Outcome Measure  Help needed turning from your back to your side while in a flat bed without using bedrails?: None Help needed moving from lying on your back to sitting on the side of a flat bed without using bedrails?: None Help needed moving to and from a bed to a chair (including a wheelchair)?: None Help needed standing up from a chair using your arms (e.g., wheelchair or bedside chair)?: None Help needed to walk in hospital room?: A Little Help needed climbing  3-5 steps with a railing? : A Little 6 Click Score: 22    End of Session Equipment Utilized During Treatment: Gait belt Activity Tolerance: Patient limited by pain;Patient limited by fatigue Patient left: with call bell/phone within reach;with chair alarm set Nurse Communication: Mobility status PT Visit Diagnosis: Muscle weakness (generalized) (M62.81);Difficulty in walking, not elsewhere classified (R26.2);Pain Pain - Right/Left: Right Pain - part of body: Hip     Time: 3716-9678 PT Time Calculation (min) (ACUTE ONLY): 16 min  Charges:  $Gait Training: 8-22 mins                     Kreg Shropshire, DPT 04/05/2020, 6:04 PM

## 2020-04-05 NOTE — Progress Notes (Signed)
Subjective: 3 Days Post-Op Procedure(s) (LRB): INTRAMEDULLARY (IM) NAIL INTERTROCHANTRIC (Right) Patient reports pain as moderate.   Patient is well, Hg 6.5 today, transfusion ordered. PT and Care management to assist with discharge planning. Negative for chest pain and shortness of breath Fever: no Gastrointestinal:Negative for nausea and vomiting Was able to walk 40 feet yesterday with PT.  Objective: Vital signs in last 24 hours: Temp:  [98 F (36.7 C)-98.6 F (37 C)] 98 F (36.7 C) (06/27 0824) Pulse Rate:  [92-98] 92 (06/27 0824) Resp:  [16] 16 (06/27 0824) BP: (98-120)/(62-69) 120/62 (06/27 0824) SpO2:  [99 %-100 %] 100 % (06/27 0824)  Intake/Output from previous day:  Intake/Output Summary (Last 24 hours) at 04/05/2020 1049 Last data filed at 04/05/2020 1027 Gross per 24 hour  Intake 1347.93 ml  Output 2675 ml  Net -1327.07 ml    Intake/Output this shift: Total I/O In: 480 [P.O.:480] Out: 375 [Urine:375]  Labs: Recent Labs    04/02/20 1848 04/03/20 0520 04/03/20 2028 04/04/20 1001 04/05/20 0628  HGB 6.6* 5.6* 7.7* 7.0* 6.5*   Recent Labs    04/04/20 1001 04/05/20 0628  WBC 11.1* 9.8  RBC 2.34* 2.18*  HCT 20.9* 19.5*  PLT 171 189   Recent Labs    04/04/20 1001 04/05/20 0628  NA 127* 132*  K 3.6 3.7  CL 95* 101  CO2 24 26  BUN 9 7  CREATININE 0.90 0.76  GLUCOSE 112* 95  CALCIUM 8.0* 8.2*   No results for input(s): LABPT, INR in the last 72 hours. EXAM General - Patient is Alert, Appropriate and Oriented Extremity - ABD soft Sensation intact distally Intact pulses distally Dorsiflexion/Plantar flexion intact Incision: moderate drainage No cellulitis present  Right knee effusion improved this AM. Able to perform a straight leg raise.  Able to flex 685 degrees with mild discomfort.  Non tender to palpation to the knee, no defect noted to the quad or patella tendon. Dressing/Incision - Moderate bloody drainage from the right hip incisions.   New honeycomb dressings applied. Motor Function - intact, moving foot and toes well on exam.  Abdomen soft with active bowel sounds.  Past Medical History:  Diagnosis Date  . Adenomatous colon polyp   . Anemia   . Depression   . Elevated LFTs     Assessment/Plan: 3 Days Post-Op Procedure(s) (LRB): INTRAMEDULLARY (IM) NAIL INTERTROCHANTRIC (Right) Principal Problem:   Closed right hip fracture (HCC) Active Problems:   Symptomatic anemia   Hyponatremia   Closed rib fracture   Leukocytosis   Rhabdomyolysis   Dehydration   Pain  Estimated body mass index is 17.79 kg/m as calculated from the following:   Height as of this encounter: 6\' 5"  (1.956 m).   Weight as of this encounter: 68 kg. Advance diet Up with therapy D/C IV fluids when tolerating po intake.  Hg this AM 6.5.  Internal med has order transfusion for today. Hold PT until transfusion is complete.  Dressings changed today. RIght knee effusion has improved, continue to monitor. Continue to work on BM. Continue with PT.  DVT Prophylaxis - Lovenox, Foot Pumps and TED hose Weight-Bearing as tolerated to right leg  J. Cameron Proud, PA-C [facility] Orthopaedic Surgery 04/05/2020, 10:49 AM

## 2020-04-05 NOTE — Progress Notes (Signed)
PT Cancellation Note  Patient Details Name: Arliss Frisina MRN: [id number] DOB: 07/25/1961   Cancelled Treatment:    Reason Eval/Treat Not Completed: Medical issues which prohibited therapy   Chart reviewed.  HgB 6.5 this am trending down with active bleeding last night per notes.  Will hold therapy session this am and continue as appropriate.   [name] 04/05/2020, 8:12 AM

## 2020-04-06 DIAGNOSIS — R7401 Elevation of levels of liver transaminase levels: Secondary | ICD-10-CM

## 2020-04-06 DIAGNOSIS — S72001A Fracture of unspecified part of neck of right femur, initial encounter for closed fracture: Secondary | ICD-10-CM

## 2020-04-06 DIAGNOSIS — W19XXXA Unspecified fall, initial encounter: Secondary | ICD-10-CM

## 2020-04-06 LAB — COMPREHENSIVE METABOLIC PANEL
ALT: 13 U/L (ref 0–44)
AST: 39 U/L (ref 15–41)
Albumin: 3.1 g/dL — ABNORMAL LOW (ref 3.5–5.0)
Alkaline Phosphatase: 117 U/L (ref 38–126)
Anion gap: 12 (ref 5–15)
BUN: 7 mg/dL (ref 6–20)
CO2: 26 mmol/L (ref 22–32)
Calcium: 8.8 mg/dL — ABNORMAL LOW (ref 8.9–10.3)
Chloride: 96 mmol/L — ABNORMAL LOW (ref 98–111)
Creatinine, Ser: 0.88 mg/dL (ref 0.61–1.24)
GFR calc Af Amer: >60 mL/min (ref >60)
GFR calc non Af Amer: >60 mL/min (ref >60)
Glucose, Bld: 117 mg/dL — ABNORMAL HIGH (ref 70–99)
Potassium: 3.5 mmol/L (ref 3.5–5.1)
Sodium: 134 mmol/L — ABNORMAL LOW (ref 135–145)
Total Bilirubin: 1.5 mg/dL — ABNORMAL HIGH (ref 0.3–1.2)
Total Protein: 5.9 g/dL — ABNORMAL LOW (ref 6.5–8.1)

## 2020-04-06 LAB — TYPE AND SCREEN
ABO/RH(D): O POS
Antibody Screen: NEGATIVE
Unit division: 0

## 2020-04-06 LAB — CBC WITH DIFFERENTIAL/PLATELET
Abs Immature Granulocytes: 0.35 10*3/uL — ABNORMAL HIGH (ref 0.00–0.07)
Basophils Absolute: 0.1 10*3/uL (ref 0.0–0.1)
Basophils Relative: 1 %
Eosinophils Absolute: 0.1 10*3/uL (ref 0.0–0.5)
Eosinophils Relative: 1 %
HCT: 26.5 % — ABNORMAL LOW (ref 39.0–52.0)
Hemoglobin: 8.8 g/dL — ABNORMAL LOW (ref 13.0–17.0)
Immature Granulocytes: 3 %
Lymphocytes Relative: 11 %
Lymphs Abs: 1.1 10*3/uL (ref 0.7–4.0)
MCH: 29.6 pg (ref 26.0–34.0)
MCHC: 33.2 g/dL (ref 30.0–36.0)
MCV: 89.2 fL (ref 80.0–100.0)
Monocytes Absolute: 1.8 10*3/uL — ABNORMAL HIGH (ref 0.1–1.0)
Monocytes Relative: 18 %
Neutro Abs: 6.9 10*3/uL (ref 1.7–7.7)
Neutrophils Relative %: 66 %
Platelets: 233 10*3/uL (ref 150–400)
RBC: 2.97 MIL/uL — ABNORMAL LOW (ref 4.22–5.81)
RDW: 14.6 % (ref 11.5–15.5)
WBC: 10.3 10*3/uL (ref 4.0–10.5)
nRBC: 0 % (ref 0.0–0.2)

## 2020-04-06 LAB — BPAM RBC
Blood Product Expiration Date: 202107272359
ISSUE DATE / TIME: 202106271249
Unit Type and Rh: 5100

## 2020-04-06 MED ORDER — TRAMADOL HCL 50 MG PO TABS: 50.0000 mg | ORAL_TABLET | Freq: Four times a day (QID) | ORAL | 0 refills | Status: DC

## 2020-04-06 MED ORDER — FERROUS SULFATE 325 (65 FE) MG PO TABS: 325.0000 mg | ORAL_TABLET | Freq: Every day | ORAL | 3 refills | Status: DC

## 2020-04-06 MED ORDER — DOCUSATE SODIUM 100 MG PO CAPS: 100.0000 mg | ORAL_CAPSULE | Freq: Two times a day (BID) | ORAL | 0 refills | Status: DC

## 2020-04-06 MED ORDER — ASPIRIN EC 325 MG PO TBEC: 325.0000 mg | DELAYED_RELEASE_TABLET | Freq: Every day | ORAL | 0 refills | Status: DC

## 2020-04-06 MED ORDER — ENSURE ENLIVE PO LIQD: 237.0000 mL | Freq: Three times a day (TID) | ORAL | 12 refills | Status: DC

## 2020-04-06 MED ORDER — FOLIC ACID 1 MG PO TABS: 1.0000 mg | ORAL_TABLET | Freq: Every day | ORAL | 1 refills | Status: DC

## 2020-04-06 MED ORDER — FERROUS SULFATE 325 (65 FE) MG PO TABS: 325.0000 mg | ORAL_TABLET | Freq: Every day | ORAL | Status: DC

## 2020-04-06 MED ORDER — ACETAMINOPHEN 325 MG PO TABS: 325.0000 mg | ORAL_TABLET | Freq: Four times a day (QID) | ORAL | 0 refills | Status: DC | PRN

## 2020-04-06 MED ORDER — ADULT MULTIVITAMIN W/MINERALS CH: 1.0000 | ORAL_TABLET | Freq: Every day | ORAL | 1 refills | Status: DC

## 2020-04-06 MED ORDER — DICLOFENAC SODIUM 1 % EX GEL: 2.0000 g | Freq: Four times a day (QID) | CUTANEOUS | 1 refills | Status: DC

## 2020-04-06 MED ORDER — OXYCODONE HCL 5 MG PO TABS: 5.0000 mg | ORAL_TABLET | ORAL | 0 refills | Status: DC | PRN

## 2020-04-06 MED ORDER — FOLIC ACID 1 MG PO TABS: 1.0000 mg | ORAL_TABLET | Freq: Every day | ORAL | Status: DC

## 2020-04-06 MED ORDER — VITAMIN D (ERGOCALCIFEROL) 1.25 MG (50000 UNIT) PO CAPS: 50000.0000 [IU] | ORAL_CAPSULE | ORAL | 0 refills | Status: DC

## 2020-04-06 NOTE — Discharge Summary (Signed)
Physician Discharge Summary  Ryan [name] EYC:[id number] DOB: [birth year] DOA: 04/01/2020  PCP: Patient, No Pcp Per  Admit date: 04/01/2020 Discharge date: 04/06/2020  Admitted From: Home Disposition: Home  Recommendations for Outpatient Follow-up:  1. Follow up with PCP in 1-2 weeks 2. Please obtain BMP/CBC in one week 3. Please follow up on the following pending results: Testosterone levels  Home Health: Yes Equipment/Devices: Rolling walker Discharge Condition: Stable CODE STATUS: Full Diet recommendation: Heart Healthy   Brief/Interim Summary: Ryan [name] a 59 y.o.malewith medical history significant ofelevated LFTs anemia and depression presented to ED via EMS yesterday after having a mechanical fall and remained on floor for 2 days.  No loss of consciousness.  Per patient he has bilateral lower extremity numbness and occasionally get vertigo resulted in frequent falls.  No prior history of fractures. Patient denies any prior medical condition and does not see a primary care physician.  His lower extremity numbness is going on for more than 5 years, and he never seek any medical attention. Imaging with intertrochanteric right hip fracture-orthopedic was consulted and he was taken for ORIF.  Imaging was also positive for some nondisplaced rib fractures which will need conservative management.  On admission patient was having hemoglobin of 7.4 and was given 1 unit without any significant improvement.  Later received 2 more units total of 3 units of PRBCs.  Hemoglobin dropped up to 6.5 postoperatively.  It was 8.8 on discharge.  Anemia labs with low iron and iron saturation along with low TIBC and normal ferritin which was more consistent with anemia of chronic disease.  He was also given an iron and folic acid supplement.  Vitamin B12 levels were normal.  He needs to follow-up with his PCP for further recommendations.  If he continued to have persistently low counts he will need to see  an hematology as an outpatient.  On arrival he has mild transaminitis.  He has an history of abnormal liver enzymes.  History of alcohol use.  UDS was positive for marijuana and cocaine.  Ultrasound liver with steatosis.  Liver enzymes normalized during hospitalization.  T bili was 1.5 on the day of discharge.  He needs to keep himself well-hydrated.  And follow-up with primary care physician.  We counseled extensively against the use of alcohol and other substances.  On arrival he had hyponatremia, elevated CKs most likely secondary to lying on floor for extended time and AKI.  That was resolved before discharge.  Patient had hip fracture by falling on ground level.  Which is little unusual for his age.  He was complaining of bilateral lower extremity numbness.  Vitamin B12 levels were within normal limit.  He has severe vitamin D deficiency.  Parathyroid hormones were within normal limit.  TSH and a.m. cortisol levels were within normal limit.  Testosterone levels pending.  He was given vitamin D supplement and will need to follow-up with primary care physician for further needs.  If he continues to complain about neuropathy then he will need a outpatient neurology consultation.  Patient was asked to get a PCP and follow-up with them regularly.  He will follow-up with orthopedic.  Discharge Diagnoses:  Principal Problem:   Closed right hip fracture (HCC) Active Problems:   Symptomatic anemia   Hyponatremia   Closed rib fracture   Leukocytosis   Rhabdomyolysis   Dehydration   Pain   Fall   Transaminitis  Discharge Instructions  Discharge Instructions    Diet - low sodium heart healthy  Complete by: As directed    Discharge instructions   Complete by: As directed    It was pleasure taking care of you. It is very important that you see your primary care physician regularly and have them monitor your blood counts. If you continue to have anemia you need to see a hematologist. I am  giving you some iron and folic acid supplement. So giving you a vitamin D supplement as your levels were very low.  You will take it 50,000 units every week and after finishing that you need to be starting on vitamin D supplement 2000 units daily, which you can get it over-the-counter. Keep yourself well-hydrated. Follow-up with orthopedic surgery.   Increase activity slowly   Complete by: As directed    Leave dressing on - Keep it clean, dry, and intact until clinic visit   Complete by: As directed      Allergies as of 04/06/2020   No Known Allergies     Medication List    TAKE these medications   acetaminophen 325 MG tablet Commonly known as: TYLENOL Take 1-2 tablets (325-650 mg total) by mouth every 6 (six) hours as needed for mild pain (pain score 1-3 or temp > 100.5).   aspirin EC 325 MG tablet Take 1 tablet (325 mg total) by mouth daily.   diclofenac Sodium 1 % Gel Commonly known as: VOLTAREN Apply 2 g topically 4 (four) times daily.   docusate sodium 100 MG capsule Commonly known as: COLACE Take 1 capsule (100 mg total) by mouth 2 (two) times daily.   feeding supplement (ENSURE ENLIVE) Liqd Take 237 mLs by mouth 3 (three) times daily between meals.   ferrous sulfate 325 (65 FE) MG tablet Take 1 tablet (325 mg total) by mouth daily with breakfast. Start taking on: April 07, 1609   folic acid 1 MG tablet Commonly known as: FOLVITE Take 1 tablet (1 mg total) by mouth daily.   multivitamin with minerals Tabs tablet Take 1 tablet by mouth daily. Start taking on: April 07, 2020   oxyCODONE 5 MG immediate release tablet Commonly known as: Oxy IR/ROXICODONE Take 1-2 tablets (5-10 mg total) by mouth every 4 (four) hours as needed for moderate pain (pain score 4-6).   traMADol 50 MG tablet Commonly known as: ULTRAM Take 1 tablet (50 mg total) by mouth every 6 (six) hours.   Vitamin D (Ergocalciferol) 1.25 MG (50000 UNIT) Caps capsule Commonly known as: DRISDOL Take  1 capsule (50,000 Units total) by mouth every 7 (seven) days. Start taking on: April 09, 2020            Discharge Care Instructions  (From admission, onward)         Start     Ordered   04/06/20 0000  Leave dressing on - Keep it clean, dry, and intact until clinic visit        04/06/20 1243          Follow-up Information    Lattie Corns, PA-C Follow up in 14 day(s).   Specialty: Physician Assistant Why: Electa Sniff information: [address] [place] [address] [phone]              No Known Allergies  Consultations:  Orthopedic  Procedures/Studies: DG Chest 1 View  Result Date: 04/01/2020 CLINICAL DATA:  Right leg pain. Preoperative evaluation. EXAM: CHEST  1 VIEW COMPARISON:  None. FINDINGS: The lungs are hyperinflated. There is no evidence of acute  infiltrate, pleural effusion or pneumothorax. The heart size and mediastinal contours are within normal limits. Ninth and tenth right rib fractures are seen. These are of indeterminate age. IMPRESSION: 1. No acute cardiopulmonary disease. 2. Ninth and tenth right rib fractures of indeterminate age. Electronically Signed   By: Virgina Norfolk M.D.   On: 04/01/2020 19:15   DG Knee 1-2 Views Right  Result Date: 04/04/2020 CLINICAL DATA:  Right knee effusion.  Right hip surgery. EXAM: RIGHT KNEE - 1-2 VIEW COMPARISON:  04/01/2020 FINDINGS: No evidence of acute fracture or dislocation. Suggestion of a small joint effusion. Mild degenerate change of the patellofemoral joint. Inferior aspect of patient's right femoral intramedullary nail intact. IMPRESSION: 1.  No acute fracture.  Suggestion of a small joint effusion. 2.  Minimal degenerative change of the patellofemoral joint. Electronically Signed   By: Marin Olp M.D.   On: 04/04/2020 11:03   CT Head Wo Contrast  Result Date: 04/01/2020 CLINICAL DATA:  Status post fall. EXAM: CT HEAD WITHOUT CONTRAST TECHNIQUE:  Contiguous axial images were obtained from the base of the skull through the vertex without intravenous contrast. COMPARISON:  None. FINDINGS: Brain: There is mild cerebral atrophy with widening of the extra-axial spaces and ventricular dilatation. There are areas of decreased attenuation within the white matter tracts of the supratentorial brain, consistent with microvascular disease changes. Vascular: No hyperdense vessel or unexpected calcification. Skull: Normal. Negative for fracture or focal lesion. Sinuses/Orbits: A chronic deformity is seen involving the right maxillary sinus. Other: None. IMPRESSION: 1. Generalized cerebral atrophy. 2. No acute intracranial abnormality. Electronically Signed   By: Virgina Norfolk M.D.   On: 04/01/2020 20:00   CT Cervical Spine Wo Contrast  Result Date: 04/01/2020 CLINICAL DATA:  Status post fall. EXAM: CT CERVICAL SPINE WITHOUT CONTRAST TECHNIQUE: Multidetector CT imaging of the cervical spine was performed without intravenous contrast. Multiplanar CT image reconstructions were also generated. COMPARISON:  None. FINDINGS: Alignment: There is approximately 2 mm retrolisthesis of the C3 vertebral body on C4. 2 mm retrolisthesis of the C5 vertebral body on C6 is also seen. Skull base and vertebrae: No acute fracture. No primary bone lesion or focal pathologic process. Soft tissues and spinal canal: No prevertebral fluid or swelling. No visible canal hematoma. Disc levels: Moderate to marked severity endplate sclerosis is seen at the levels of C3-C4, C4-C5 and C5-C6. Mild to moderate severity endplate sclerosis is noted at the level of C6-C7. Marked severity intervertebral disc space narrowing is seen at the levels of C3-C4, C4-C5, C5-C6 and C6-C7. Mild to moderate severity bilateral multilevel facet joint hypertrophy is seen. Upper chest: There is mild biapical scarring and/or atelectasis. Other: None. IMPRESSION: 1. No acute fracture within the cervical spine. 2. Marked  severity multilevel degenerative changes, most prominent at the levels of C3-C4, C4-C5, C5-C6 and C6-C7. 3. 2 mm retrolisthesis of the C3 vertebral body on C4 and C5 vertebral body on C6. Electronically Signed   By: Virgina Norfolk M.D.   On: 04/01/2020 20:06   DG Tibia/Fibula Right Port  Result Date: 04/01/2020 CLINICAL DATA:  Right leg pain and swelling. EXAM: PORTABLE RIGHT TIBIA AND FIBULA - 2 VIEW COMPARISON:  November 08, 2014 FINDINGS: There is no evidence of an acute fracture or dislocation. A chronic fracture of the right lateral malleolus is noted. Soft tissues are unremarkable. IMPRESSION: No acute osseous abnormality. Electronically Signed   By: Virgina Norfolk M.D.   On: 04/01/2020 18:36   DG HIP OPERATIVE UNILAT W OR  W/O PELVIS RIGHT  Result Date: 04/02/2020 CLINICAL DATA:  59 year old male with ORIF of the right femoral fracture. EXAM: OPERATIVE right HIP (WITH PELVIS IF PERFORMED) 2 VIEWS TECHNIQUE: Fluoroscopic spot image(s) were submitted for interpretation post-operatively. COMPARISON:  Right hip radiograph dated 04/01/2020. FINDINGS: Four intraoperative spot fluoroscopic images provided. The total fluoroscopic time is 1 minutes 6 seconds. There is internal fixation of the right femoral neck fracture with and intramedullary nail and transcervical screw. IMPRESSION: Status post ORIF of the right femoral neck fracture. Electronically Signed   By: Anner Crete M.D.   On: 04/02/2020 15:58   DG Hip Unilat W or Wo Pelvis 2-3 Views Right  Result Date: 04/01/2020 CLINICAL DATA:  Status post fall. EXAM: DG HIP (WITH OR WITHOUT PELVIS) 2-3V RIGHT COMPARISON:  None. FINDINGS: Acute inter trochanteric fracture is seen involving the proximal right femur. There is no evidence of dislocation. There is no evidence of arthropathy or other focal bone abnormality. IMPRESSION: Acute intertrochanteric fracture of the proximal right femur. Electronically Signed   By: Virgina Norfolk M.D.   On:  04/01/2020 19:14   US Abdomen Limited RUQ  Result Date: 04/03/2020 CLINICAL DATA:  59 year old male with history of transaminitis. EXAM: ULTRASOUND ABDOMEN LIMITED RIGHT UPPER QUADRANT COMPARISON:  No priors. FINDINGS: Gallbladder: No gallstones or wall thickening visualized. No sonographic Murphy sign noted by sonographer. Common bile duct: Diameter: 4.2 mm Liver: No focal lesion identified. Diffusely increased echogenicity in the hepatic parenchyma. Portal vein is patent on color Doppler imaging with normal direction of blood flow towards the liver. Other: Trace volume of ascites. IMPRESSION: 1. Diffusely increased hepatic echogenicity, indicative of hepatic steatosis. 2. Trace volume of ascites. Electronically Signed   By: Vinnie Langton M.D.   On: 04/03/2020 16:15    Subjective: Patient has no new complaints.  He was able to ambulate without any difficulty.  He would like to go back home.  Discharge Exam: Vitals:   04/06/20 0016 04/06/20 0727  BP: 115/69 107/75  Pulse: 99 77  Resp: 18 18  Temp: 99.2 F (37.3 C) 97.8 F (36.6 C)  SpO2: 98% 100%   Vitals:   04/05/20 1309 04/05/20 1549 04/06/20 0016 04/06/20 0727  BP: 117/61 111/79 115/69 107/75  Pulse: 96 87 99 77  Resp: 18 16 18 18   Temp: 97.9 F (36.6 C) 98.6 F (37 C) 99.2 F (37.3 C) 97.8 F (36.6 C)  TempSrc: Oral Oral Oral Oral  SpO2: 100% 100% 98% 100%  Weight:      Height:        General: Pt is alert, awake, not in acute distress Cardiovascular: RRR, S1/S2 +, no rubs, no gallops Respiratory: CTA bilaterally, no wheezing, no rhonchi Abdominal: Soft, NT, ND, bowel sounds + Extremities: no edema, no cyanosis   The results of significant diagnostics from this hospitalization (including imaging, microbiology, ancillary and laboratory) are listed below for reference.    Microbiology: Recent Results (from the past 240 hour(s))  SARS Coronavirus 2 by RT PCR (hospital order, performed in [facility] hospital lab)  Nasopharyngeal Nasopharyngeal Swab     Status: None   Collection Time: 04/01/20  8:13 PM   Specimen: Nasopharyngeal Swab  Result Value Ref Range Status   SARS Coronavirus 2 NEGATIVE NEGATIVE Final    Comment: (NOTE) SARS-CoV-2 target nucleic acids are NOT DETECTED.  The SARS-CoV-2 RNA is generally detectable in upper and lower respiratory specimens during the acute phase of infection. The lowest concentration of SARS-CoV-2 viral copies this  assay can detect is 250 copies / mL. A negative result does not preclude SARS-CoV-2 infection and should not be used as the sole basis for treatment or other patient management decisions.  A negative result may occur with improper specimen collection / handling, submission of specimen other than nasopharyngeal swab, presence of viral mutation(s) within the areas targeted by this assay, and inadequate number of viral copies (<250 copies / mL). A negative result must be combined with clinical observations, patient history, and epidemiological information.  Fact Sheet for Patients:   StrictlyIdeas.no  Fact Sheet for Healthcare Providers: BankingDealers.co.za  This test is not yet approved or  cleared by the [place] FDA and has been authorized for detection and/or diagnosis of SARS-CoV-2 by FDA under an Emergency Use Authorization (EUA).  This EUA will remain in effect (meaning this test can be used) for the duration of the COVID-19 declaration under Section 564(b)(1) of the Act, 21 U.S.C. section 360bbb-3(b)(1), unless the authorization is terminated or revoked sooner.  Performed at [facility], [address] [place], [place] [address]      Labs: BNP (last 3 results) No results for input(s): BNP in the last 8760 hours. Basic Metabolic Panel: Recent Labs  Lab 04/01/20 2013 04/01/20 2252 04/02/20 0527 04/02/20 0527 04/03/20 0520 04/03/20 1630 04/04/20 1001 04/05/20 0628  04/06/20 0917  NA   < >  --  128*   < > 127* 128* 127* 132* 134*  K   < >  --  4.1   < > 3.5 3.7 3.6 3.7 3.5  CL   < >  --  95*   < > 98 97* 95* 101 96*  CO2   < >  --  22   < > 23 24 24 26 26   GLUCOSE   < >  --  105*   < > 99 92 112* 95 117*  BUN   < >  --  15   < > 13 12 9 7 7   CREATININE   < >  --  1.69*   < > 1.20 0.98 0.90 0.76 0.88  CALCIUM   < >  --  7.8*   < > 7.5* 7.8* 8.0* 8.2* 8.8*  MG  --  1.6* 2.0  --   --   --   --   --   --   PHOS  --  4.7*  --   --   --   --   --   --   --    < > = values in this interval not displayed.   Liver Function Tests: Recent Labs  Lab 04/01/20 2013 04/03/20 0520 04/06/20 0917  AST 61* 33 39  ALT 28 16 13   ALKPHOS 140* 94 117  BILITOT 1.4* 0.5 1.5*  PROT 7.1 4.8* 5.9*  ALBUMIN 3.9 2.6* 3.1*   No results for input(s): LIPASE, AMYLASE in the last 168 hours. No results for input(s): AMMONIA in the last 168 hours. CBC: Recent Labs  Lab 04/01/20 2013 04/02/20 0527 04/02/20 1848 04/02/20 1848 04/03/20 0520 04/03/20 2028 04/04/20 1001 04/05/20 0628 04/06/20 0917  WBC 15.8*   < > 13.8*  --  11.3*  --  11.1* 9.8 10.3  NEUTROABS 12.4*  --   --   --   --   --  7.5  --  6.9  HGB 7.5*   < > 6.6*   < > 5.6* 7.7* 7.0* 6.5* 8.8*  HCT 20.9*   < >  19.0*   < > 16.6* 22.9* 20.9* 19.5* 26.5*  MCV 82.9   < > 82.3  --  86.9  --  89.3 89.4 89.2  PLT 159   < > 120*  --  124*  --  171 189 233   < > = values in this interval not displayed.   Cardiac Enzymes: Recent Labs  Lab 04/01/20 2013 04/02/20 0527  CKTOTAL 666* 460*   BNP: Invalid input(s): POCBNP CBG: No results for input(s): GLUCAP in the last 168 hours. D-Dimer No results for input(s): DDIMER in the last 72 hours. Hgb A1c No results for input(s): HGBA1C in the last 72 hours. Lipid Profile No results for input(s): CHOL, HDL, LDLCALC, TRIG, CHOLHDL, LDLDIRECT in the last 72 hours. Thyroid function studies No results for input(s): TSH, T4TOTAL, T3FREE, THYROIDAB in the last 72  hours.  Invalid input(s): FREET3 Anemia work up No results for input(s): VITAMINB12, FOLATE, FERRITIN, TIBC, IRON, RETICCTPCT in the last 72 hours. Urinalysis    Component Value Date/Time   COLORURINE AMBER (A) 04/01/2020 2343   APPEARANCEUR CLOUDY (A) 04/01/2020 2343   LABSPEC 1.012 04/01/2020 2343   PHURINE 5.0 04/01/2020 2343   GLUCOSEU NEGATIVE 04/01/2020 2343   HGBUR NEGATIVE 04/01/2020 2343   BILIRUBINUR NEGATIVE 04/01/2020 2343   KETONESUR 5 (A) 04/01/2020 2343   PROTEINUR NEGATIVE 04/01/2020 2343   NITRITE NEGATIVE 04/01/2020 2343   LEUKOCYTESUR NEGATIVE 04/01/2020 2343   Sepsis Labs Invalid input(s): PROCALCITONIN,  WBC,  LACTICIDVEN Microbiology Recent Results (from the past 240 hour(s))  SARS Coronavirus 2 by RT PCR (hospital order, performed in [facility] hospital lab) Nasopharyngeal Nasopharyngeal Swab     Status: None   Collection Time: 04/01/20  8:13 PM   Specimen: Nasopharyngeal Swab  Result Value Ref Range Status   SARS Coronavirus 2 NEGATIVE NEGATIVE Final    Comment: (NOTE) SARS-CoV-2 target nucleic acids are NOT DETECTED.  The SARS-CoV-2 RNA is generally detectable in upper and lower respiratory specimens during the acute phase of infection. The lowest concentration of SARS-CoV-2 viral copies this assay can detect is 250 copies / mL. A negative result does not preclude SARS-CoV-2 infection and should not be used as the sole basis for treatment or other patient management decisions.  A negative result may occur with improper specimen collection / handling, submission of specimen other than nasopharyngeal swab, presence of viral mutation(s) within the areas targeted by this assay, and inadequate number of viral copies (<250 copies / mL). A negative result must be combined with clinical observations, patient history, and epidemiological information.  Fact Sheet for Patients:   StrictlyIdeas.no  Fact Sheet for Healthcare  Providers: BankingDealers.co.za  This test is not yet approved or  cleared by the [place] FDA and has been authorized for detection and/or diagnosis of SARS-CoV-2 by FDA under an Emergency Use Authorization (EUA).  This EUA will remain in effect (meaning this test can be used) for the duration of the COVID-19 declaration under Section 564(b)(1) of the Act, 21 U.S.C. section 360bbb-3(b)(1), unless the authorization is terminated or revoked sooner.  Performed at [facility], [address] [place], [place] [address]     Time coordinating discharge: Over 30 minutes  SIGNED:  Lorella Nimrod, MD  [facility] 04/06/2020, 12:54 PM  If 7PM-7AM, please contact night-coverage [email or web address]  This record has been created using Systems analyst. Errors have been sought and corrected,but may not always be located. Such creation errors do not reflect on the standard  of care.

## 2020-04-06 NOTE — TOC Initial Note (Signed)
Transition of Care [facility]) - Initial/Assessment Note    Patient Details  Name: Ryan [name] MRN: [id number] Date of Birth: 1961/06/03  Transition of Care [facility]) CM/SW Contact:    Su Hilt, RN Phone Number: 04/06/2020, 12:24 PM  Clinical Narrative:                 Met with the patient to discuss DC needs, He needs EMS to get home and will need a RW and 3 in 1, I asked for the RW to be delivered to the hospital to take home with EMS but the 3 in 1 to be delivered to the home I spoke to [place] at [place] him up with PT, OT and aide thru [place]  The patient lives alone and works close to home, he will be out of work a while and he stated that his boss is very understanding  [facility] and a referral was sent to [address] [facility] He will get medications thru [facility]   Expected Discharge Plan: [place] Barriers to Discharge: Barriers Resolved   Patient Goals and CMS Choice Patient states their goals for this hospitalization and ongoing recovery are:: get better and get back to work      Expected Discharge Plan and Services Expected Discharge Plan: [place] arrangements for the past 2 months: Single Family Home                 DME Arranged: 3-N-1, Walker rolling DME Agency: [employer] Date DME Agency Contacted: 04/06/20 Time DME Agency Contacted: 1223 Representative spoke with at DME Agency: zack HH Arranged: PT, OT, Nurse's Aide HH Agency: [facility] Date [place]: 04/06/20 Time [place]: 1223 Representative spoke with at [place]: [place]  Prior Living Arrangements/Services Living arrangements for the past 2 months: [place] with:: Self Patient language and need for interpreter reviewed:: Yes Do you feel safe going back to the place where you live?: Yes      Need for Family  Participation in Patient Care: Yes (Comment) Care giver support system in place?: No (comment)   Criminal Activity/Legal Involvement Pertinent to Current Situation/Hospitalization: No - Comment as needed  Activities of Daily Living Home Assistive Devices/Equipment: None ADL Screening (condition at time of admission) Patient's cognitive ability adequate to safely complete daily activities?: Yes Is the patient deaf or have difficulty hearing?: No Does the patient have difficulty seeing, even when wearing glasses/contacts?: No Does the patient have difficulty concentrating, remembering, or making decisions?: No Patient able to express need for assistance with ADLs?: Yes Does the patient have difficulty dressing or bathing?: No Independently performs ADLs?: Yes (appropriate for developmental age) Does the patient have difficulty walking or climbing stairs?: Yes (due to surgery) Weakness of Legs: None Weakness of Arms/Hands: None  Permission Sought/Granted Permission sought to share information with : Case Manager Permission granted to share information with : Yes, Verbal Permission Granted  Share Information with NAME: TOC Department           Emotional Assessment Appearance:: Appears stated age Attitude/Demeanor/Rapport: Ambitious, Engaged, Self-Confident Affect (typically observed): Accepting Orientation: : Oriented to Self, Oriented to Place, Oriented to  Time, Oriented to Situation Alcohol / Substance Use: Never Used Psych Involvement: No (comment)  Admission diagnosis:  Pain [R52] Fall [W19.XXXA] Closed right hip fracture ([place]) [  S72.001A] Patient Active Problem List   Diagnosis Date Noted  . Pain   . Closed right hip fracture ([place]) 04/01/2020  . Symptomatic anemia 04/01/2020  . Hyponatremia 04/01/2020  . Closed rib fracture 04/01/2020  . Leukocytosis 04/01/2020  . Rhabdomyolysis 04/01/2020  . Dehydration 04/01/2020   PCP:  Patient, No Pcp Per Pharmacy:   [facility] [place], [place] [address] [place] [address] Phone: [phone] Fax: [place] #[id number] [address], [place] [address] [place] [address] Phone: [phone] Fax: [phone]  [facility] [place], [place] [address] [place] [address] Phone: [phone] Fax: [phone]     Social Determinants of Health (SDOH) Interventions    Readmission Risk Interventions No flowsheet data found.

## 2020-04-06 NOTE — Plan of Care (Signed)
  Problem: Education: Goal: Knowledge of General Education information will improve Description: Including pain rating scale, medication(s)/side effects and non-pharmacologic comfort measures Outcome: Progressing   Problem: Pain Managment: Goal: General experience of comfort will improve Outcome: Progressing   Problem: Safety: Goal: Ability to remain free from injury will improve Outcome: Progressing   

## 2020-04-06 NOTE — Progress Notes (Signed)
Discharge reviewed. Answered questions. Changed dressing prior to discharge reinforced with ABD pads. EMS transported to home. All RXs given

## 2020-04-06 NOTE — Progress Notes (Signed)
Ch visited with Pt as part of routine rounding. Pt spoke at length about pain and struggling to deal with family members checking up on him. Ch listened. Pt asked for prayer. Ch prayed with Pt. Pt was grateful for visit.

## 2020-04-06 NOTE — TOC Progression Note (Signed)
Transition of Care [facility]) - Progression Note    Patient Details  Name: Ryan [name] MRN: [id number] Date of Birth: 1960-11-12  Transition of Care [facility]) CM/SW [place], RN Phone Number: 04/06/2020, 3:36 PM  Clinical Narrative:    The patient is not able to get meds at med mgt due to being over the counter and they do not assist with narcotics, He will get his narcotic pain meds from [facility], He is going to be transported via EMS. [facility] was called for transport, they will be here at 430 to prick up, the bed side nurse is aware and ok with it   Expected Discharge Plan: [place] Barriers to Discharge: Barriers Resolved  Expected Discharge Plan and Services Expected Discharge Plan: [place] arrangements for the past 2 months: Single Family Home Expected Discharge Date: 04/06/20               DME Arranged: Berta Minor rolling DME Agency: [employer] Date DME Agency Contacted: 04/06/20 Time DME Agency Contacted: 1223 Representative spoke with at DME Agency: zack HH Arranged: PT, OT, Nurse's Aide [place] Agency: [facility] Date [place]: 04/06/20 Time [place]: 1223 Representative spoke with at [place]: [place] ([place]) Interventions    Readmission Risk Interventions No flowsheet data found.

## 2020-04-06 NOTE — Progress Notes (Signed)
Subjective: 4 Days Post-Op Procedure(s) (LRB): INTRAMEDULLARY (IM) NAIL INTERTROCHANTRIC (Right) Patient reports pain as moderate.   Patient is well, Hg 8.8 today, transfusion performed yesterday. Plan for discharge home with HHPT. Negative for chest pain and shortness of breath Fever: no Gastrointestinal:Negative for nausea and vomiting Was able to walk 200 feet yesterday with PT.  Objective: Vital signs in last 24 hours: Temp:  [97.8 F (36.6 C)-99.2 F (37.3 C)] 97.8 F (36.6 C) (06/28 0727) Pulse Rate:  [77-99] 77 (06/28 0727) Resp:  [16-18] 18 (06/28 0727) BP: (107-118)/(61-79) 107/75 (06/28 0727) SpO2:  [98 %-100 %] 100 % (06/28 0727)  Intake/Output from previous day:  Intake/Output Summary (Last 24 hours) at 04/06/2020 1132 Last data filed at 04/06/2020 1008 Gross per 24 hour  Intake 1266.67 ml  Output 1400 ml  Net -133.33 ml    Intake/Output this shift: Total I/O In: 240 [P.O.:240] Out: 100 [Urine:100]  Labs: Recent Labs    04/03/20 2028 04/04/20 1001 04/05/20 0628 04/06/20 0917  HGB 7.7* 7.0* 6.5* 8.8*   Recent Labs    04/05/20 0628 04/06/20 0917  WBC 9.8 10.3  RBC 2.18* 2.97*  HCT 19.5* 26.5*  PLT 189 233   Recent Labs    04/05/20 0628 04/06/20 0917  NA 132* 134*  K 3.7 3.5  CL 101 96*  CO2 26 26  BUN 7 7  CREATININE 0.76 0.88  GLUCOSE 95 117*  CALCIUM 8.2* 8.8*   No results for input(s): LABPT, INR in the last 72 hours. EXAM General - Patient is Alert, Appropriate and Oriented Extremity - ABD soft Sensation intact distally Intact pulses distally Dorsiflexion/Plantar flexion intact Incision: moderate drainage No cellulitis present  Right knee effusion improved this AM. Able to perform a straight leg raise.  Able to flex 685 degrees with mild discomfort.  Non tender to palpation to the knee, no defect noted to the quad or patella tendon. Dressing/Incision - Moderate bloody drainage from the right hip incisions.  New honeycombs have  recently been applied by nursing staff. Motor Function - intact, moving foot and toes well on exam.  Abdomen soft with active bowel sounds.  Past Medical History:  Diagnosis Date  . Adenomatous colon polyp   . Anemia   . Depression   . Elevated LFTs     Assessment/Plan: 4 Days Post-Op Procedure(s) (LRB): INTRAMEDULLARY (IM) NAIL INTERTROCHANTRIC (Right) Principal Problem:   Closed right hip fracture (HCC) Active Problems:   Symptomatic anemia   Hyponatremia   Closed rib fracture   Leukocytosis   Rhabdomyolysis   Dehydration   Pain  Estimated body mass index is 17.79 kg/m as calculated from the following:   Height as of this encounter: 6\' 5"  (1.956 m).   Weight as of this encounter: 68 kg. Up with therapy  Hg this AM 8.8. Continue with PT. Patient has had a BM.  Given the patients continued bleeding and recent transfusions, will plan on discharge home on 325mg  aspirin daily for DVT prevention.  Will take patient off of Lovenox. Follow-up with [place] in 10-14 days for staple removal and x-rays. [place] for discharge home from orthopaedic standpoint.  DVT Prophylaxis - Lovenox, Foot Pumps and TED hose Weight-Bearing as tolerated to right leg  J. Cameron Proud, PA-C [facility] Orthopaedic Surgery 04/06/2020, 11:32 AM

## 2020-04-06 NOTE — Progress Notes (Signed)
Physical Therapy Treatment Patient Details Name: Ryan [name] MRN: [id number] DOB: 1961/09/02 Today's Date: 04/06/2020    History of Present Illness 59 y.o. male with a history of depression and chronic anemia.  He fell at home and had signficant R hip pain 3 days prior to coming to the hospital.  Found to have a femur fracture and is now s/p ORIF 6/24.  Not seen POD1 secondary to hemoglobin in the 5s and needing transfusion.    PT Comments    Declined AM wanting pain medication prior.  Pt given medication by RN and returned to see pt after lunch.  He is able to get out of bed without assist and complete 1 lap around unit with RW and min guard x 1.  Occasional verbal cues for walker placement as he occasionally steps too far into walker box.  Voiced understanding.  Remained in recliner after session.  Stated he has no stairs and a ramp at home.  Pt remained in recliner.  No chair alarm in room.  Pt stated he has been up every day with no alarm and declines it's use.  RN and techs aware and pt is able to voice need for assistance and to call for assist if he needs to get up.  Pt awaiting discharge.     Follow Up Recommendations  Home health PT;Supervision - Intermittent     Equipment Recommendations  Rolling walker with 5" wheels;3in1 (PT)    Recommendations for Other Services       Precautions / Restrictions Precautions Precautions: Fall Restrictions Weight Bearing Restrictions: Yes RLE Weight Bearing: Weight bearing as tolerated    Mobility  Bed Mobility Overal bed mobility: Modified Independent Bed Mobility: Supine to Sit     Supine to sit: Modified independent (Device/Increase time)        Transfers Overall transfer level: Modified independent Equipment used: Rolling walker (2 wheeled) Transfers: Sit to/from Stand Sit to Stand: Min guard            Ambulation/Gait Ambulation/Gait assistance: Counsellor (Feet): 200 Feet Assistive device: Rolling  walker (2 wheeled) Gait Pattern/deviations: Step-through pattern Gait velocity: decreased   General Gait Details: verbal cues to not get too close to walker bar and overal general safety   Stairs             Wheelchair Mobility    Modified Rankin (Stroke Patients Only)       Balance Overall balance assessment: Needs assistance Sitting-balance support: Feet supported Sitting balance-Leahy Scale: Good     Standing balance support: Bilateral upper extremity supported Standing balance-Leahy Scale: Fair                              Cognition Arousal/Alertness: Awake/alert Behavior During Therapy: WFL for tasks assessed/performed Overall Cognitive Status: Within Functional Limits for tasks assessed                                        Exercises      General Comments        Pertinent Vitals/Pain Pain Assessment: Faces Faces Pain Scale: Hurts a little bit Pain Location: R thigh Pain Descriptors / Indicators: Sore Pain Intervention(s): Premedicated before session    Home Living  Prior Function            PT Goals (current goals can now be found in the care plan section) Progress towards PT goals: Progressing toward goals    Frequency    BID      PT Plan Current plan remains appropriate    Co-evaluation              AM-PAC PT "6 Clicks" Mobility   Outcome Measure  Help needed turning from your back to your side while in a flat bed without using bedrails?: None Help needed moving from lying on your back to sitting on the side of a flat bed without using bedrails?: None Help needed moving to and from a bed to a chair (including a wheelchair)?: None Help needed standing up from a chair using your arms (e.g., wheelchair or bedside chair)?: None Help needed to walk in hospital room?: A Little Help needed climbing 3-5 steps with a railing? : A Little 6 Click Score: 22    End of  Session Equipment Utilized During Treatment: Gait belt Activity Tolerance: Patient limited by pain;Patient limited by fatigue Patient left: with call bell/phone within reach;with chair alarm set Nurse Communication: Mobility status PT Visit Diagnosis: Muscle weakness (generalized) (M62.81);Difficulty in walking, not elsewhere classified (R26.2);Pain Pain - Right/Left: Right Pain - part of body: Hip     Time: 1587-2761 PT Time Calculation (min) (ACUTE ONLY): 12 min  Charges:  $Gait Training: 8-22 mins                    [name], PTA 04/06/20, 2:20 PM

## 2020-04-06 NOTE — Discharge Instructions (Signed)

## 2020-04-08 LAB — TESTOSTERONE,FREE AND TOTAL
Testosterone, Free: 6.9 pg/mL — ABNORMAL LOW (ref 7.2–24.0)
Testosterone: 355 ng/dL (ref 264–916)

## 2020-04-09 ENCOUNTER — Telehealth: Payer: Self-pay | Admitting: Gerontology

## 2020-04-14 NOTE — Telephone Encounter (Signed)
Left a voicemail about becoming a patient

## 2020-04-15 ENCOUNTER — Telehealth: Payer: Self-pay | Admitting: General Practice

## 2020-04-15 NOTE — Telephone Encounter (Signed)
Individual is not interested in becoming a patient. No further attempts to contact the individual will be made.

## 2020-06-09 ENCOUNTER — Telehealth: Payer: Self-pay | Admitting: Pharmacist

## 2020-06-09 NOTE — Telephone Encounter (Signed)
Patient failed to provide requested 2021 financial documentation. No additional medication assistance will be provided by [facility] without the required proof of income documentation. Patient notified by letter Debra Cheek Administrative Assistant [facility] 

## 2021-03-30 ENCOUNTER — Inpatient Hospital Stay: Payer: Medicaid Other

## 2021-03-30 ENCOUNTER — Emergency Department: Payer: Medicaid Other

## 2021-03-30 ENCOUNTER — Other Ambulatory Visit: Payer: Self-pay

## 2021-03-30 ENCOUNTER — Inpatient Hospital Stay
Admission: EM | Admit: 2021-03-30 | Discharge: 2021-06-02 | DRG: 722 | Disposition: A | Discharge status: Home / Self Care | Payer: Medicaid Other | Attending: Internal Medicine | Admitting: Internal Medicine

## 2021-03-30 DIAGNOSIS — D638 Anemia in other chronic diseases classified elsewhere: Secondary | ICD-10-CM | POA: Diagnosis present

## 2021-03-30 DIAGNOSIS — F17200 Nicotine dependence, unspecified, uncomplicated: Secondary | ICD-10-CM | POA: Diagnosis present

## 2021-03-30 DIAGNOSIS — C7951 Secondary malignant neoplasm of bone: Secondary | ICD-10-CM | POA: Diagnosis present

## 2021-03-30 DIAGNOSIS — Z79899 Other long term (current) drug therapy: Secondary | ICD-10-CM

## 2021-03-30 DIAGNOSIS — F1410 Cocaine abuse, uncomplicated: Secondary | ICD-10-CM | POA: Diagnosis present

## 2021-03-30 DIAGNOSIS — G5791 Unspecified mononeuropathy of right lower limb: Secondary | ICD-10-CM | POA: Diagnosis present

## 2021-03-30 DIAGNOSIS — D696 Thrombocytopenia, unspecified: Secondary | ICD-10-CM | POA: Diagnosis present

## 2021-03-30 DIAGNOSIS — E871 Hypo-osmolality and hyponatremia: Secondary | ICD-10-CM | POA: Diagnosis present

## 2021-03-30 DIAGNOSIS — M4854XA Collapsed vertebra, not elsewhere classified, thoracic region, initial encounter for fracture: Secondary | ICD-10-CM | POA: Diagnosis present

## 2021-03-30 DIAGNOSIS — E860 Dehydration: Secondary | ICD-10-CM | POA: Diagnosis present

## 2021-03-30 DIAGNOSIS — M25551 Pain in right hip: Secondary | ICD-10-CM | POA: Diagnosis present

## 2021-03-30 DIAGNOSIS — R509 Fever, unspecified: Secondary | ICD-10-CM

## 2021-03-30 DIAGNOSIS — Z66 Do not resuscitate: Secondary | ICD-10-CM | POA: Diagnosis not present

## 2021-03-30 DIAGNOSIS — Z20822 Contact with and (suspected) exposure to covid-19: Secondary | ICD-10-CM | POA: Diagnosis present

## 2021-03-30 DIAGNOSIS — B888 Other specified infestations: Secondary | ICD-10-CM | POA: Diagnosis present

## 2021-03-30 DIAGNOSIS — X58XXXA Exposure to other specified factors, initial encounter: Secondary | ICD-10-CM | POA: Diagnosis present

## 2021-03-30 DIAGNOSIS — S22000A Wedge compression fracture of unspecified thoracic vertebra, initial encounter for closed fracture: Secondary | ICD-10-CM | POA: Diagnosis present

## 2021-03-30 DIAGNOSIS — E8809 Other disorders of plasma-protein metabolism, not elsewhere classified: Secondary | ICD-10-CM | POA: Diagnosis present

## 2021-03-30 DIAGNOSIS — E43 Unspecified severe protein-calorie malnutrition: Secondary | ICD-10-CM | POA: Diagnosis present

## 2021-03-30 DIAGNOSIS — M4856XA Collapsed vertebra, not elsewhere classified, lumbar region, initial encounter for fracture: Secondary | ICD-10-CM | POA: Diagnosis present

## 2021-03-30 DIAGNOSIS — E876 Hypokalemia: Secondary | ICD-10-CM | POA: Diagnosis present

## 2021-03-30 DIAGNOSIS — Z681 Body mass index (BMI) 19 or less, adult: Secondary | ICD-10-CM | POA: Diagnosis not present

## 2021-03-30 DIAGNOSIS — Z86010 Personal history of colonic polyps: Secondary | ICD-10-CM

## 2021-03-30 DIAGNOSIS — Z5900 Homelessness unspecified: Secondary | ICD-10-CM

## 2021-03-30 DIAGNOSIS — C61 Malignant neoplasm of prostate: Secondary | ICD-10-CM | POA: Diagnosis not present

## 2021-03-30 DIAGNOSIS — E872 Acidosis: Secondary | ICD-10-CM | POA: Diagnosis present

## 2021-03-30 DIAGNOSIS — M79673 Pain in unspecified foot: Secondary | ICD-10-CM

## 2021-03-30 DIAGNOSIS — S72001A Fracture of unspecified part of neck of right femur, initial encounter for closed fracture: Secondary | ICD-10-CM | POA: Diagnosis present

## 2021-03-30 DIAGNOSIS — R7989 Other specified abnormal findings of blood chemistry: Secondary | ICD-10-CM | POA: Diagnosis present

## 2021-03-30 DIAGNOSIS — R54 Age-related physical debility: Secondary | ICD-10-CM | POA: Diagnosis present

## 2021-03-30 DIAGNOSIS — S2239XA Fracture of one rib, unspecified side, initial encounter for closed fracture: Secondary | ICD-10-CM | POA: Diagnosis present

## 2021-03-30 DIAGNOSIS — M48061 Spinal stenosis, lumbar region without neurogenic claudication: Secondary | ICD-10-CM | POA: Diagnosis present

## 2021-03-30 DIAGNOSIS — E538 Deficiency of other specified B group vitamins: Secondary | ICD-10-CM | POA: Diagnosis present

## 2021-03-30 DIAGNOSIS — Z79891 Long term (current) use of opiate analgesic: Secondary | ICD-10-CM

## 2021-03-30 DIAGNOSIS — R64 Cachexia: Secondary | ICD-10-CM | POA: Diagnosis present

## 2021-03-30 DIAGNOSIS — M8448XA Pathological fracture, other site, initial encounter for fracture: Secondary | ICD-10-CM | POA: Diagnosis present

## 2021-03-30 DIAGNOSIS — F32A Depression, unspecified: Secondary | ICD-10-CM | POA: Diagnosis present

## 2021-03-30 DIAGNOSIS — E861 Hypovolemia: Secondary | ICD-10-CM | POA: Diagnosis present

## 2021-03-30 DIAGNOSIS — I959 Hypotension, unspecified: Secondary | ICD-10-CM

## 2021-03-30 DIAGNOSIS — D649 Anemia, unspecified: Secondary | ICD-10-CM | POA: Diagnosis present

## 2021-03-30 DIAGNOSIS — E222 Syndrome of inappropriate secretion of antidiuretic hormone: Secondary | ICD-10-CM | POA: Diagnosis present

## 2021-03-30 DIAGNOSIS — E46 Unspecified protein-calorie malnutrition: Secondary | ICD-10-CM | POA: Diagnosis present

## 2021-03-30 DIAGNOSIS — Z9181 History of falling: Secondary | ICD-10-CM

## 2021-03-30 DIAGNOSIS — R634 Abnormal weight loss: Secondary | ICD-10-CM

## 2021-03-30 LAB — URINALYSIS, COMPLETE (UACMP) WITH MICROSCOPIC
Bacteria, UA: NONE SEEN
Bilirubin Urine: NEGATIVE
Glucose, UA: NEGATIVE mg/dL
Hgb urine dipstick: NEGATIVE
Ketones, ur: NEGATIVE mg/dL
Leukocytes,Ua: NEGATIVE
Nitrite: NEGATIVE
Protein, ur: NEGATIVE mg/dL
Specific Gravity, Urine: 1.018 (ref 1.005–1.030)
pH: 6 (ref 5.0–8.0)

## 2021-03-30 LAB — COMPREHENSIVE METABOLIC PANEL
ALT: 8 U/L (ref 0–44)
AST: 51 U/L — ABNORMAL HIGH (ref 15–41)
Albumin: 2.9 g/dL — ABNORMAL LOW (ref 3.5–5.0)
Alkaline Phosphatase: 884 U/L — ABNORMAL HIGH (ref 38–126)
Anion gap: 12 (ref 5–15)
BUN: 9 mg/dL (ref 6–20)
CO2: 17 mmol/L — ABNORMAL LOW (ref 22–32)
Calcium: 8 mg/dL — ABNORMAL LOW (ref 8.9–10.3)
Chloride: 97 mmol/L — ABNORMAL LOW (ref 98–111)
Creatinine, Ser: 0.64 mg/dL (ref 0.61–1.24)
GFR, Estimated: >60 mL/min (ref >60)
Glucose, Bld: 86 mg/dL (ref 70–99)
Potassium: 3.9 mmol/L (ref 3.5–5.1)
Sodium: 126 mmol/L — ABNORMAL LOW (ref 135–145)
Total Bilirubin: 0.6 mg/dL (ref 0.3–1.2)
Total Protein: 6.2 g/dL — ABNORMAL LOW (ref 6.5–8.1)

## 2021-03-30 LAB — CBC WITH DIFFERENTIAL/PLATELET
Abs Immature Granulocytes: 0.1 10*3/uL — ABNORMAL HIGH (ref 0.00–0.07)
Basophils Absolute: 0 10*3/uL (ref 0.0–0.1)
Basophils Relative: 0 %
Eosinophils Absolute: 0 10*3/uL (ref 0.0–0.5)
Eosinophils Relative: 0 %
HCT: 18.5 % — ABNORMAL LOW (ref 39.0–52.0)
Hemoglobin: 5.6 g/dL — ABNORMAL LOW (ref 13.0–17.0)
Immature Granulocytes: 2 %
Lymphocytes Relative: 21 %
Lymphs Abs: 1.3 10*3/uL (ref 0.7–4.0)
MCH: 24.2 pg — ABNORMAL LOW (ref 26.0–34.0)
MCHC: 30.3 g/dL (ref 30.0–36.0)
MCV: 80.1 fL (ref 80.0–100.0)
Monocytes Absolute: 0.5 10*3/uL (ref 0.1–1.0)
Monocytes Relative: 7 %
Neutro Abs: 4.3 10*3/uL (ref 1.7–7.7)
Neutrophils Relative %: 70 %
Platelets: 121 10*3/uL — ABNORMAL LOW (ref 150–400)
RBC: 2.31 MIL/uL — ABNORMAL LOW (ref 4.22–5.81)
RDW: 20.9 % — ABNORMAL HIGH (ref 11.5–15.5)
WBC: 6.2 10*3/uL (ref 4.0–10.5)
nRBC: 0 % (ref 0.0–0.2)

## 2021-03-30 LAB — CBC
HCT: 26.3 % — ABNORMAL LOW (ref 39.0–52.0)
Hemoglobin: 8.7 g/dL — ABNORMAL LOW (ref 13.0–17.0)
MCH: 25.7 pg — ABNORMAL LOW (ref 26.0–34.0)
MCHC: 33.1 g/dL (ref 30.0–36.0)
MCV: 77.6 fL — ABNORMAL LOW (ref 80.0–100.0)
Platelets: 112 10*3/uL — ABNORMAL LOW (ref 150–400)
RBC: 3.39 MIL/uL — ABNORMAL LOW (ref 4.22–5.81)
RDW: 20.1 % — ABNORMAL HIGH (ref 11.5–15.5)
WBC: 5.8 10*3/uL (ref 4.0–10.5)
nRBC: 0.7 % — ABNORMAL HIGH (ref 0.0–0.2)

## 2021-03-30 LAB — VITAMIN B12: Vitamin B-12: 890 pg/mL (ref 180–914)

## 2021-03-30 LAB — LACTIC ACID, PLASMA: Lactic Acid, Venous: 2.6 mmol/L (ref 0.5–1.9)

## 2021-03-30 LAB — URINE DRUG SCREEN, QUALITATIVE (ARMC ONLY)
Amphetamines, Ur Screen: NOT DETECTED
Barbiturates, Ur Screen: NOT DETECTED
Benzodiazepine, Ur Scrn: NOT DETECTED
Cannabinoid 50 Ng, Ur ~~LOC~~: POSITIVE — AB
Cocaine Metabolite,Ur ~~LOC~~: POSITIVE — AB
MDMA (Ecstasy)Ur Screen: NOT DETECTED
Methadone Scn, Ur: NOT DETECTED
Opiate, Ur Screen: POSITIVE — AB
Phencyclidine (PCP) Ur S: NOT DETECTED
Tricyclic, Ur Screen: NOT DETECTED

## 2021-03-30 LAB — RESP PANEL BY RT-PCR (FLU A&B, COVID) ARPGX2
Influenza A by PCR: NEGATIVE
Influenza B by PCR: NEGATIVE
SARS Coronavirus 2 by RT PCR: NEGATIVE

## 2021-03-30 LAB — FERRITIN: Ferritin: 1056 ng/mL — ABNORMAL HIGH (ref 24–336)

## 2021-03-30 LAB — RETICULOCYTES
Immature Retic Fract: 36.8 % — ABNORMAL HIGH (ref 2.3–15.9)
RBC.: 2.35 MIL/uL — ABNORMAL LOW (ref 4.22–5.81)
Retic Count, Absolute: 123.8 10*3/uL (ref 19.0–186.0)
Retic Ct Pct: 5.3 % — ABNORMAL HIGH (ref 0.4–3.1)

## 2021-03-30 LAB — PREPARE RBC (CROSSMATCH)

## 2021-03-30 LAB — IRON AND TIBC
Iron: 46 ug/dL (ref 45–182)
Saturation Ratios: 30 % (ref 17.9–39.5)
TIBC: 151 ug/dL — ABNORMAL LOW (ref 250–450)
UIBC: 105 ug/dL

## 2021-03-30 LAB — FOLATE: Folate: 5.8 ng/mL — ABNORMAL LOW (ref >5.9)

## 2021-03-30 LAB — CK: Total CK: 141 U/L (ref 49–397)

## 2021-03-30 MED ORDER — IOHEXOL 9 MG/ML PO SOLN
1000.0000 mL | Freq: Once | ORAL | Status: AC | PRN
  Administered 2021-03-30: 1000 mL via ORAL
  Filled 2021-03-30: qty 1000

## 2021-03-30 MED ORDER — FERROUS SULFATE 325 (65 FE) MG PO TABS
325.0000 mg | ORAL_TABLET | Freq: Every day | ORAL | Status: DC
  Administered 2021-03-31 – 2021-06-02 (×44): 325 mg via ORAL
  Filled 2021-03-30 (×52): qty 1

## 2021-03-30 MED ORDER — SODIUM CHLORIDE 0.9 % IV BOLUS
1000.0000 mL | Freq: Once | INTRAVENOUS | Status: AC
  Administered 2021-03-30: 1000 mL via INTRAVENOUS

## 2021-03-30 MED ORDER — ENSURE ENLIVE PO LIQD: 237.0000 mL | Freq: Three times a day (TID) | ORAL | Status: DC

## 2021-03-30 MED ORDER — ADULT MULTIVITAMIN W/MINERALS CH
1.0000 | ORAL_TABLET | Freq: Every day | ORAL | Status: DC
  Administered 2021-03-31 – 2021-04-26 (×19): 1 via ORAL
  Filled 2021-03-30 (×26): qty 1

## 2021-03-30 MED ORDER — ONDANSETRON HCL 4 MG/2ML IJ SOLN
4.0000 mg | Freq: Once | INTRAMUSCULAR | Status: AC
  Administered 2021-03-30: 4 mg via INTRAVENOUS
  Filled 2021-03-30: qty 2

## 2021-03-30 MED ORDER — FOLIC ACID 1 MG PO TABS
1.0000 mg | ORAL_TABLET | Freq: Every day | ORAL | Status: DC
  Administered 2021-03-31 – 2021-06-02 (×48): 1 mg via ORAL
  Filled 2021-03-30 (×55): qty 1

## 2021-03-30 MED ORDER — FENTANYL CITRATE (PF) 100 MCG/2ML IJ SOLN: 25.0000 ug | INTRAMUSCULAR | Status: DC | PRN

## 2021-03-30 MED ORDER — MORPHINE SULFATE (PF) 2 MG/ML IV SOLN: 2.0000 mg | INTRAVENOUS | Status: DC | PRN

## 2021-03-30 MED ORDER — MORPHINE SULFATE (PF) 2 MG/ML IV SOLN
2.0000 mg | INTRAVENOUS | Status: DC | PRN
  Administered 2021-03-30: 2 mg via INTRAVENOUS
  Filled 2021-03-30: qty 1

## 2021-03-30 MED ORDER — ACETAMINOPHEN 650 MG RE SUPP: 650.0000 mg | Freq: Four times a day (QID) | RECTAL | Status: AC | PRN

## 2021-03-30 MED ORDER — MORPHINE SULFATE (PF) 2 MG/ML IV SOLN
2.0000 mg | INTRAVENOUS | Status: AC | PRN
  Filled 2021-03-30: qty 1

## 2021-03-30 MED ORDER — ENOXAPARIN SODIUM 40 MG/0.4ML IJ SOSY
40.0000 mg | PREFILLED_SYRINGE | INTRAMUSCULAR | Status: DC
  Administered 2021-03-30 – 2021-04-12 (×8): 40 mg via SUBCUTANEOUS
  Filled 2021-03-30 (×13): qty 0.4

## 2021-03-30 MED ORDER — SODIUM CHLORIDE 0.9 % IV SOLN: 10.0000 mL/h | Freq: Once | INTRAVENOUS | Status: DC

## 2021-03-30 MED ORDER — ONDANSETRON HCL 4 MG/2ML IJ SOLN
4.0000 mg | Freq: Four times a day (QID) | INTRAMUSCULAR | Status: AC | PRN
  Administered 2021-04-14 – 2021-05-13 (×3): 4 mg via INTRAVENOUS
  Filled 2021-03-30 (×3): qty 2

## 2021-03-30 MED ORDER — IOHEXOL 300 MG/ML  SOLN
100.0000 mL | Freq: Once | INTRAMUSCULAR | Status: AC | PRN
  Administered 2021-03-30: 100 mL via INTRAVENOUS
  Filled 2021-03-30: qty 100

## 2021-03-30 MED ORDER — ACETAMINOPHEN 325 MG PO TABS
650.0000 mg | ORAL_TABLET | Freq: Once | ORAL | Status: AC
  Administered 2021-03-30: 650 mg via ORAL
  Filled 2021-03-30: qty 2

## 2021-03-30 MED ORDER — KETOROLAC TROMETHAMINE 30 MG/ML IJ SOLN: 30.0000 mg | Freq: Three times a day (TID) | INTRAMUSCULAR | Status: AC | PRN

## 2021-03-30 MED ORDER — MORPHINE SULFATE (PF) 4 MG/ML IV SOLN: 4.0000 mg | INTRAVENOUS | Status: DC | PRN

## 2021-03-30 MED ORDER — ACETAMINOPHEN 325 MG PO TABS
650.0000 mg | ORAL_TABLET | Freq: Four times a day (QID) | ORAL | Status: AC | PRN
  Administered 2021-04-02: 650 mg via ORAL
  Filled 2021-03-30: qty 2

## 2021-03-30 MED ORDER — ONDANSETRON HCL 4 MG PO TABS
4.0000 mg | ORAL_TABLET | Freq: Four times a day (QID) | ORAL | Status: AC | PRN
  Administered 2021-05-13 – 2021-05-15 (×2): 4 mg via ORAL
  Filled 2021-03-30 (×2): qty 1

## 2021-03-30 NOTE — ED Notes (Signed)
Patient in Xray

## 2021-03-30 NOTE — ED Provider Notes (Signed)
[facility] Provider Note    Event Date/Time   First MD Initiated Contact with Patient 03/30/21 1056     (approximate)  I have reviewed the triage vital signs and the nursing notes.   HISTORY  Chief Complaint Pain Management    HPI Ryan [name] is a 60 y.o. male with the below listed past medical history presents to the ER for intermittent right lateral hip pain that is been ongoing for the past several weeks.  No fevers.  Denies any injury.  Had surgery here 10 months ago.  Denies any overlying warmth or erythema.  No nausea or vomiting.  No chest pain or shortness of breath.   Past Medical History:  Diagnosis Date   Adenomatous colon polyp    Anemia    Depression    Elevated LFTs    Family History  Problem Relation Age of Onset   Heart disease Other    Colon polyps Cousin        maternal   Colon polyps Cousin    Colon polyps Maternal Aunt    Colon cancer Maternal Grandfather    Past Surgical History:  Procedure Laterality Date   FRACTURE SURGERY     INTRAMEDULLARY (IM) NAIL INTERTROCHANTERIC Right 04/02/2020   Procedure: INTRAMEDULLARY (IM) NAIL INTERTROCHANTRIC;  Surgeon: Christena Flake, MD;  Location: [facility] ORS;  Service: Orthopedics;  Laterality: Right;   Patient Active Problem List   Diagnosis Date Noted   Fall    Transaminitis    Pain    Closed right hip fracture (HCC) 04/01/2020   Symptomatic anemia 04/01/2020   Hyponatremia 04/01/2020   Closed rib fracture 04/01/2020   Leukocytosis 04/01/2020   Rhabdomyolysis 04/01/2020   Dehydration 04/01/2020      Prior to Admission medications   Medication Sig Start Date End Date Taking? Authorizing Provider  acetaminophen (TYLENOL) 325 MG tablet Take 1-2 tablets (325-650 mg total) by mouth every 6 (six) hours as needed for mild pain (pain score 1-3 or temp > 100.5). 04/06/20   Arnetha Courser, MD  aspirin EC 325 MG tablet Take 1 tablet (325 mg total) by mouth  daily. Patient not taking: Reported on 03/30/2021 04/06/20   Arnetha Courser, MD  diclofenac Sodium (VOLTAREN) 1 % GEL Apply 2 g topically 4 (four) times daily. 04/06/20   Arnetha Courser, MD  docusate sodium (COLACE) 100 MG capsule Take 1 capsule (100 mg total) by mouth 2 (two) times daily. 04/06/20   Arnetha Courser, MD  feeding supplement, ENSURE ENLIVE, (ENSURE ENLIVE) LIQD Take 237 mLs by mouth 3 (three) times daily between meals. 04/06/20   Arnetha Courser, MD  ferrous sulfate 325 (65 FE) MG tablet Take 1 tablet (325 mg total) by mouth daily with breakfast. 04/07/20   Arnetha Courser, MD  folic acid (FOLVITE) 1 MG tablet Take 1 tablet (1 mg total) by mouth daily. 04/06/20   Arnetha Courser, MD  Multiple Vitamin (MULTIVITAMIN WITH MINERALS) TABS tablet Take 1 tablet by mouth daily. 04/07/20   Arnetha Courser, MD  oxyCODONE (OXY IR/ROXICODONE) 5 MG immediate release tablet Take 1-2 tablets (5-10 mg total) by mouth every 4 (four) hours as needed for moderate pain (pain score 4-6). 04/06/20   Anson Oregon, PA-C  traMADol (ULTRAM) 50 MG tablet Take 1 tablet (50 mg total) by mouth every 6 (six) hours. 04/06/20   Anson Oregon, PA-C  Vitamin D, Ergocalciferol, (DRISDOL) 1.25 MG (50000 UNIT) CAPS capsule Take 1 capsule (50,000 Units total)  by mouth every 7 (seven) days. 04/09/20   Lorella Nimrod, MD    Allergies Patient has no known allergies.    Social History Social History   Tobacco Use   Smoking status: Some Days    Pack years: 0.00   Smokeless tobacco: Never  Substance Use Topics   Alcohol use: Yes    Comment: 4 beers weekly   Drug use: No    Review of Systems Patient denies headaches, rhinorrhea, blurry vision, numbness, shortness of breath, chest pain, edema, cough, abdominal pain, nausea, vomiting, diarrhea, dysuria, fevers, rashes or hallucinations unless otherwise stated above in HPI. ____________________________________________   PHYSICAL EXAM:  VITAL SIGNS: Vitals:   03/30/21 1139  03/30/21 1230  BP: (!) 81/53 (!) 96/56  Pulse: 91 (!) 101  Resp: 18 18  Temp:    SpO2: 98% 94%    Constitutional: Alert and oriented. frail and dehydrated appearing but in no acute distress. Eyes: Conjunctivae are normal.  Head: Atraumatic. Nose: No congestion/rhinnorhea. Mouth/Throat: Mucous membranes are dry.   Neck: Painless ROM.  Cardiovascular:   Good peripheral circulation. Respiratory: Normal respiratory effort.  No retractions.  Gastrointestinal: Soft and nontender.  Musculoskeletal: No lower extremity tenderness .  Painless passive range of motion of the right hip.  Some reproducible pain over the right greater trochanter without deformity or overlying warmth.  No joint effusions. Neurologic:  Normal speech and language. No gross focal neurologic deficits are appreciated.  Skin:  Skin is warm, dry and intact. No rash noted. Psychiatric: Mood and affect are normal. Speech and behavior are normal.  ____________________________________________   LABS (all labs ordered are listed, but only abnormal results are displayed)  Results for orders placed or performed during the hospital encounter of 03/30/21 (from the past 24 hour(s))  CBC with Differential     Status: Abnormal   Collection Time: 03/30/21 11:35 AM  Result Value Ref Range   WBC 6.2 4.0 - 10.5 K/uL   RBC 2.31 (L) 4.22 - 5.81 MIL/uL   Hemoglobin 5.6 (L) 13.0 - 17.0 g/dL   HCT 18.5 (L) 39.0 - 52.0 %   MCV 80.1 80.0 - 100.0 fL   MCH 24.2 (L) 26.0 - 34.0 pg   MCHC 30.3 30.0 - 36.0 g/dL   RDW 20.9 (H) 11.5 - 15.5 %   Platelets 121 (L) 150 - 400 K/uL   nRBC 0.0 0.0 - 0.2 %   Neutrophils Relative % 70 %   Neutro Abs 4.3 1.7 - 7.7 K/uL   Lymphocytes Relative 21 %   Lymphs Abs 1.3 0.7 - 4.0 K/uL   Monocytes Relative 7 %   Monocytes Absolute 0.5 0.1 - 1.0 K/uL   Eosinophils Relative 0 %   Eosinophils Absolute 0.0 0.0 - 0.5 K/uL   Basophils Relative 0 %   Basophils Absolute 0.0 0.0 - 0.1 K/uL   Immature  Granulocytes 2 %   Abs Immature Granulocytes 0.10 (H) 0.00 - 0.07 K/uL  Comprehensive metabolic panel     Status: Abnormal   Collection Time: 03/30/21 11:35 AM  Result Value Ref Range   Sodium 126 (L) 135 - 145 mmol/L   Potassium 3.9 3.5 - 5.1 mmol/L   Chloride 97 (L) 98 - 111 mmol/L   CO2 17 (L) 22 - 32 mmol/L   Glucose, Bld 86 70 - 99 mg/dL   BUN 9 6 - 20 mg/dL   Creatinine, Ser 0.64 0.61 - 1.24 mg/dL   Calcium 8.0 (L) 8.9 - 10.3 mg/dL  Total Protein 6.2 (L) 6.5 - 8.1 g/dL   Albumin 2.9 (L) 3.5 - 5.0 g/dL   AST 51 (H) 15 - 41 U/L   ALT 8 0 - 44 U/L   Alkaline Phosphatase 884 (H) 38 - 126 U/L   Total Bilirubin 0.6 0.3 - 1.2 mg/dL   GFR, Estimated >60 >60 mL/min   Anion gap 12 5 - 15  Reticulocytes     Status: Abnormal   Collection Time: 03/30/21 11:35 AM  Result Value Ref Range   Retic Ct Pct 5.3 (H) 0.4 - 3.1 %   RBC. 2.35 (L) 4.22 - 5.81 MIL/uL   Retic Count, Absolute 123.8 19.0 - 186.0 K/uL   Immature Retic Fract 36.8 (H) 2.3 - 15.9 %  Prepare RBC (crossmatch)     Status: None (Preliminary result)   Collection Time: 03/30/21 12:21 PM  Result Value Ref Range   Order Confirmation PENDING    ____________________________________________ ____________________________________________  RADIOLOGY  I personally reviewed all radiographic images ordered to evaluate for the above acute complaints and reviewed radiology reports and findings.  These findings were personally discussed with the patient.  Please see medical record for radiology report.  ____________________________________________   PROCEDURES  Procedure(s) performed:  .Critical Care  Date/Time: 03/30/2021 12:42 PM Performed by: Merlyn Lot, MD Authorized by: Merlyn Lot, MD   Critical care provider statement:    Critical care time (minutes):  32   Critical care time was exclusive of:  Separately billable procedures and treating other patients   Critical care was necessary to treat or prevent  imminent or life-threatening deterioration of the following conditions:  Dehydration (anemia requiring transfusion)   Critical care was time spent personally by me on the following activities:  Development of treatment plan with patient or surrogate, discussions with consultants, evaluation of patient's response to treatment, examination of patient, obtaining history from patient or surrogate, ordering and performing treatments and interventions, ordering and review of laboratory studies, ordering and review of radiographic studies, pulse oximetry, re-evaluation of patient's condition and review of old charts    Critical Care performed: no ____________________________________________   INITIAL IMPRESSION / [place] / ED COURSE  Pertinent labs & imaging results that were available during my care of the patient were reviewed by me and considered in my medical decision making (see chart for details).   DDX: fracture, contusion, dislocation, arthritis, aki, electrolyte abn, sepsis  Ryan [name] is a 60 y.o. who presents to the ED with presentation as described above.  Patient frail and appears much older than stated age.  Mildly low blood pressure in triage but is afebrile not tachycardic.  Clinically appears dehydrated and states he is not had anything to eat or drink today.  Will check blood work.  Will order x-rays.  Will give IV fluids.  Clinical Course as of 03/30/21 1243  Tue Mar 30, 2021  1200 Patient with evidence of acute hyponatremia as well as his anemia.  We will add on type and screen.  Will order transfusion.  Patient receiving IV fluids for his hyponatremia. [PR]  1221 X-rays concerning for marrow infiltrative process.  No history of multiple myeloma the patient presentation this is in the differential.  Given his hyponatremia weakness and symptomatic anemia will order IV blood transfusion in addition to IV fluids and will discuss with hospitalist for admission. [PR]     Clinical Course User Index [PR] Merlyn Lot, MD     ____________________________________________   FINAL CLINICAL IMPRESSION(S) /  ED DIAGNOSES  Final diagnoses:  Symptomatic anemia  Hyponatremia      NEW MEDICATIONS STARTED DURING THIS VISIT:  New Prescriptions   No medications on file     Note:  This document was prepared using Dragon voice recognition software and may include unintentional dictation errors.     Merlyn Lot, MD 03/30/21 1243

## 2021-03-30 NOTE — H&P (Signed)
History and Physical   Ryan [name] RJJ:[id number] DOB: 08/03/1961 DOA: 03/30/2021  PCP: Pcp, No  Outpatient Specialists: no Patient coming from: Home via EMS  I have personally briefly reviewed patient's old medical records in [place].  Chief Concern: Right hip pain  HPI: Ryan [name] is a 60 y.o. male with medical history significant for history of polysubstance abuse including THC, cocaine, alcohol, remote history of tobacco use, history of right proximal femur inter trochanteric fracture status post right IM nail intertrochanteric fixation in 04/02/2021, does not follow-up with a PCP, presents to the emergency department for chief concerns of right hip pain.  He reports that his right hip pain has been ongoing for several months. He denies trauma to his person.  He reports the pain has made it difficult for him to ambulate.  Prior to this at baseline he did not ambulate with a walker or Rollator, or a cane.  He denies fever, loss of appetite, chest pain, shortness of breath, dysuria, hematuria.  He reports that the pain just started on its own and has been unremitting.  He describes the pain as dull.  He reports that in the last few months its been difficult for him to get around due to chronic weakness.  He also endorses an unintentional 35 pound weight loss in the last year.  He reports that he last had a colonoscopy 5 to 8 years ago.  He reports no personal history of cancer.  He endorses history of colon cancer in his maternal cousins.  At bedside he was able to tell me his name, age, location, current calendar year.  He appears cachectic and frail.  He reports that the acetaminophen given to him by the ED provider improved his pain.  He denies bright red blood per rectum and melena.  He reports normal bowel movements and caliber of his stool.  Social history: He lives at home by himself.  He is in the process of obtaining disability due to chronic dizziness and right hip pain.  He  had no prior knowledge that he had cancer.  He denies tobacco, recreational drug use.  He reports that he formally smoked while in college and in his peak he smoked 1 pack/day.  He reports he has not smoked in 20 to 30 years.  He reports that he has some college education, taking accounting classes.  He endorses infrequent EtOH use, last beer was about 1 month ago.  Vaccination history: He is vaccinated for COVID-19, 2 doses of [employer].  ROS: Constitutional: + weight change, no fever ENT/Mouth: no sore throat, no rhinorrhea Eyes: no eye pain, no vision changes Cardiovascular: no chest pain, no dyspnea,  no edema, no palpitations Respiratory: no cough, no sputum, no wheezing Gastrointestinal: no nausea, no vomiting, no diarrhea, no constipation Genitourinary: no urinary incontinence, no dysuria, no hematuria Musculoskeletal: + right hip pain that started several months ago, no myalgias Skin: no skin lesions, no pruritus, Neuro: + weakness, no loss of consciousness, no syncope Psych: no anxiety, no depression, no decrease appetite Heme/Lymph: no bruising, no bleeding  ED Course: Discussed with ED provider, patient requiring hospitalization for symptomatic anemia.  Labs in the emergency department was remarkable for temperature of 97.6, respiration rate of 16, heart rate 98, blood pressure initially was 84/60 and responded to fluid and increased to 96/56.  SPO2 was 100% on room air.  Labs in the emergency department was remarkable for hemoglobin 5.6, WBC of 6.2, platelets 121, serum  sodium 126, potassium 3.9, chloride 97, bicarb 17, BUN 9, serum creatinine of 0.64, nonfasting blood glucose 86.  eGFR is greater than 60.  AST 51, ALT 8.  Alk phos elevated at 884.  Assessment/Plan  Principal Problem:   Symptomatic anemia Active Problems:   Closed right hip fracture (HCC)   Hyponatremia   Closed rib fracture   Acute anemia   Thrombocytopenia (HCC)   Thoracic compression  fracture, closed, initial encounter [facility])   Pathologic fracture of thoracic vertebrae, initial encounter   Malnourished ([place])   Protein-calorie malnutrition, severe ([place])   Cancer cachexia ([place])   # Severe symptomatic hypochromic, normocytic, anemia-suspect secondary to underlying malignancy # Borderline hypotension-responsive to fluid, suspect secondary to chronic severe anemia - Low clinical suspicion for sepsis at this time, patient does not have elevated WBC, fever, increased respiration rate - Work-up in progress at this time however I suspect this may be myeloma - He was typed and screened - 2 units of packed red blood cells has been ordered - Repeat CBC upon completion of 2 units packed red blood, scheduled for 03/30/2021 at 2000 - CBC in the a.m.  # X-ray read of possible myeloma or metastatic disease # Several thoracic compression fracture, possible pathologic - Patient endorses to me that he would like to speak to an oncologist to discuss his prognosis, and options at this time - Etiology, primary work-up in progress at this time - Medical oncologist has been consulted recommends CT chest abdomen pelvis to assess for primary - Myeloma panel has been ordered per oncologist request - [name] has been consulted and will see the patient  # History of right proximal femur intratrochanteric fracture in June 2021 -Status post IM nail intertrochanteric fixation on 04/02/2021 -He followed up with Dr. Roland Rack, and his staples were removed on 7/84/2021  # Thrombocytopenia-suspect related to myeloma, CBC in the a.m.  # Protein/calorie malnutrition - Ensure 3 times daily resumed - Dietitian has been consulted  # History of polysubstance abuse  # Nursing discovered bedbug infestation  Pain control: Ketorolac 30 mg IV every 8 hours as needed for moderate pain, 2 days ordered; morphine 2 mg IV every 4 hours as needed for severe pain, 1 day ordered  A.m. labs: CBC, BMP  Chart  reviewed.   DVT prophylaxis: Enoxaparin 40 mg subcutaneous every 24 hours Code Status: Full code Diet: Regular diet Family Communication: No, patient updated his family.  I offered to call family members however he declined.  He states he just wants to talk to a cancer doctor Disposition Plan: Pending clinical course Consults called: Medical oncology Admission status: Inpatient, progressive cardiac, 24 hours of telemetry ordered  Past Medical History:  Diagnosis Date   Adenomatous colon polyp    Anemia    Depression    Elevated LFTs    Past Surgical History:  Procedure Laterality Date   FRACTURE SURGERY     INTRAMEDULLARY (IM) NAIL INTERTROCHANTERIC Right 04/02/2020   Procedure: INTRAMEDULLARY (IM) NAIL INTERTROCHANTRIC;  Surgeon: Corky Mull, MD;  Location: [facility] ORS;  Service: Orthopedics;  Laterality: Right;   Social History:  reports that he has been smoking. He has never used smokeless tobacco. He reports current alcohol use. He reports that he does not use drugs.  No Known Allergies Family History  Problem Relation Age of Onset   Heart disease Other    Colon polyps Cousin        maternal   Colon polyps Cousin  Colon polyps Maternal Aunt    Colon cancer Maternal Grandfather    Family history: Family history reviewed and not pertinent  Prior to Admission medications   Medication Sig Start Date End Date Taking? Authorizing Provider  acetaminophen (TYLENOL) 325 MG tablet Take 1-2 tablets (325-650 mg total) by mouth every 6 (six) hours as needed for mild pain (pain score 1-3 or temp > 100.5). 04/06/20   Lorella Nimrod, MD  aspirin EC 325 MG tablet Take 1 tablet (325 mg total) by mouth daily. Patient not taking: Reported on 03/30/2021 04/06/20   Lorella Nimrod, MD  diclofenac Sodium (VOLTAREN) 1 % GEL Apply 2 g topically 4 (four) times daily. 04/06/20   Lorella Nimrod, MD  docusate sodium (COLACE) 100 MG capsule Take 1 capsule (100 mg total) by mouth 2 (two) times daily.  04/06/20   Lorella Nimrod, MD  feeding supplement, ENSURE ENLIVE, (ENSURE ENLIVE) LIQD Take 237 mLs by mouth 3 (three) times daily between meals. 04/06/20   Lorella Nimrod, MD  ferrous sulfate 325 (65 FE) MG tablet Take 1 tablet (325 mg total) by mouth daily with breakfast. 04/07/20   Lorella Nimrod, MD  folic acid (FOLVITE) 1 MG tablet Take 1 tablet (1 mg total) by mouth daily. 04/06/20   Lorella Nimrod, MD  Multiple Vitamin (MULTIVITAMIN WITH MINERALS) TABS tablet Take 1 tablet by mouth daily. 04/07/20   Lorella Nimrod, MD  oxyCODONE (OXY IR/ROXICODONE) 5 MG immediate release tablet Take 1-2 tablets (5-10 mg total) by mouth every 4 (four) hours as needed for moderate pain (pain score 4-6). 04/06/20   Lattie Corns, PA-C  traMADol (ULTRAM) 50 MG tablet Take 1 tablet (50 mg total) by mouth every 6 (six) hours. 04/06/20   Lattie Corns, PA-C  Vitamin D, Ergocalciferol, (DRISDOL) 1.25 MG (50000 UNIT) CAPS capsule Take 1 capsule (50,000 Units total) by mouth every 7 (seven) days. 04/09/20   Lorella Nimrod, MD   Physical Exam: Vitals:   03/30/21 1029 03/30/21 1030 03/30/21 1139 03/30/21 1230  BP: (!) 84/60  (!) 81/53 (!) 96/56  Pulse: 98  91 (!) 101  Resp: $Remo'16  18 18  'ZlhnK$ Temp: 97.6 F (36.4 C)     TempSrc: Oral     SpO2: 100%  98% 94%  Weight:  59 kg    Height:  $Remove'6\' 5"'vMuBZSi$  (1.956 m)     Constitutional: appears age-appropriate, frail, cachectic, pale, NAD, calm, comfortable Eyes: PERRL, lids and conjunctivae normal ENMT: Mucous membranes are moist. Posterior pharynx clear of any exudate or lesions. Age-appropriate dentition. Hearing appropriate Neck: normal, supple, no masses, no thyromegaly Respiratory: clear to auscultation bilaterally, no wheezing, no crackles. Normal respiratory effort. No accessory muscle use.  Cardiovascular: Regular rate and rhythm, no murmurs / rubs / gallops. No extremity edema. 2+ pedal pulses. No carotid bruits.  Abdomen: no tenderness, no masses palpated, no  hepatosplenomegaly. Bowel sounds positive.  Musculoskeletal: no clubbing / cyanosis. No joint deformity upper and lower extremities. Good ROM, no contracture.  Bilateral upper and lower extremity diffuse muscle loss in setting of severely malnourished and cachectic patient.  Extremity atrophy present. Skin: no rashes, lesions, ulcers. No induration Neurologic: Sensation intact. Strength 5/5 in all 4.  Psychiatric: Normal judgment and insight. Alert and oriented x 3. Normal mood.   EKG: Not indicated at this time  X-ray on Admission: I personally reviewed and I agree with radiologist reading as below.  DG Chest 2 View  Result Date: 03/30/2021 CLINICAL DATA:  Post hip surgery EXAM:  CHEST - 2 VIEW COMPARISON:  June 2021 FINDINGS: No consolidation or edema. No pleural effusion. No pneumothorax. Normal heart size. There is a diffusely abnormal appearance to included osseous structures with a mottled appearance. There are multiple thoracic compression fractures. IMPRESSION: Diffusely abnormal appearance of osseous structures concerning for a marrow infiltrative process. Multiple thoracic compression fractures are likely pathologic. Electronically Signed   By: [name] M.D.   On: 03/30/2021 12:04   DG Hip Unilat W or Wo Pelvis 2-3 Views Right  Result Date: 03/30/2021 CLINICAL DATA:  Right hip pain.  ORIF. EXAM: DG HIP (WITH OR WITHOUT PELVIS) 2-3V RIGHT COMPARISON:  04/02/2020. FINDINGS: Diffuse severe osteopenia with diffuse mottled lucencies noted throughout the bony structures of the visualized lumbar spine and pelvis. A process such as myeloma or metastatic disease cannot be excluded. What appears to be at L5 compression fracture noted. Lumbar spine series can be obtained for confirmation. No other acute bony abnormality identified. Postsurgical changes about the right hip. Hardware intact. Anatomic alignment. IMPRESSION: Diffuse severe osteopenia with diffuse mild wall lucencies noted throughout  the bony structures of the visualized lumbar spine and pelvis. A process such as myeloma or metastatic disease cannot be excluded. 2. What appears to be a L5 compression fracture noted. Lumbar spine series can be obtained for confirmation. No other acute bony abnormality identified. 3. Postsurgical changes right hip. Hardware intact. Electronically Signed   By: Marcello Moores  Register   On: 03/30/2021 11:59    Labs on Admission: I have personally reviewed following labs  CBC: Recent Labs  Lab 03/30/21 1135  WBC 6.2  NEUTROABS 4.3  HGB 5.6*  HCT 18.5*  MCV 80.1  PLT 701*   Basic Metabolic Panel: Recent Labs  Lab 03/30/21 1135  NA 126*  K 3.9  CL 97*  CO2 17*  GLUCOSE 86  BUN 9  CREATININE 0.64  CALCIUM 8.0*   GFR: Estimated Creatinine Clearance: 83 mL/min (by C-G formula based on SCr of 0.64 mg/dL).  Liver Function Tests: Recent Labs  Lab 03/30/21 1135  AST 51*  ALT 8  ALKPHOS 884*  BILITOT 0.6  PROT 6.2*  ALBUMIN 2.9*   Cardiac Enzymes: Recent Labs  Lab 03/30/21 1135  CKTOTAL 141   Anemia Panel: Recent Labs    03/30/21 1135  FOLATE 5.8*  FERRITIN 1,056*  TIBC 151*  IRON 46  RETICCTPCT 5.3*   Urine analysis:    Component Value Date/Time   COLORURINE AMBER (A) 04/01/2020 2343   APPEARANCEUR CLOUDY (A) 04/01/2020 2343   LABSPEC 1.012 04/01/2020 2343   PHURINE 5.0 04/01/2020 2343   GLUCOSEU NEGATIVE 04/01/2020 2343   HGBUR NEGATIVE 04/01/2020 2343   BILIRUBINUR NEGATIVE 04/01/2020 2343   KETONESUR 5 (A) 04/01/2020 2343   PROTEINUR NEGATIVE 04/01/2020 2343   NITRITE NEGATIVE 04/01/2020 2343   LEUKOCYTESUR NEGATIVE 04/01/2020 2343   CRITICAL CARE Performed by: Briant Cedar Dekota Kirlin  Total critical care time: 35 minutes  Critical care time was exclusive of separately billable procedures and treating other patients.  Critical care was necessary to treat or prevent imminent or life-threatening deterioration.  Circulatory failure, severe anemia, new cancer  diagnosis  Critical care was time spent personally by me on the following activities: development of treatment plan with patient and/or surrogate as well as nursing, discussions with consultants, evaluation of patient's response to treatment, examination of patient, obtaining history from patient or surrogate, ordering and performing treatments and interventions, ordering and review of laboratory studies, ordering and review of radiographic  studies, pulse oximetry and re-evaluation of patient's condition.  Dr. Tobie Poet [facility]  If 7PM-7AM, please contact overnight-coverage provider If 7AM-7PM, please contact day coverage provider [email or web address]  03/30/2021, 2:43 PM

## 2021-03-30 NOTE — ED Notes (Signed)
Phlebotomy at bedside.

## 2021-03-30 NOTE — ED Notes (Signed)
First Nurse Note: Pt to ED via [facility] for right hip pain x several months.

## 2021-03-30 NOTE — ED Notes (Signed)
Type and screen hemolyzed per lab, phlebotomy coming to recollect

## 2021-03-30 NOTE — ED Notes (Signed)
Dr. Cox at bedside.  

## 2021-03-30 NOTE — ED Notes (Signed)
Patient returned from [place]. CT noted patient to have bed bugs when moved from stretcher. All clothing except boxers removed from patient, all sheets and blankets removed. Patient provided with hospital gown and warm blankets.

## 2021-03-30 NOTE — ED Notes (Signed)
Patient transported to CT 

## 2021-03-30 NOTE — ED Notes (Signed)
See triage note  Presents with right hip pain  States s/p surgery  About 10 months ago

## 2021-03-30 NOTE — ED Triage Notes (Signed)
Pt c/o right hip pain since having surgery 10 mo ago, pt states he has not followed up with the surgeon for pain management.

## 2021-03-30 NOTE — ED Notes (Signed)
Patient was provided with urinal, patient reports it fell on floor and he could not reach it, upon entering room patient urinated in soda can. Patient informed of need for urine specimen again and urinal within reach.

## 2021-03-31 ENCOUNTER — Encounter: Payer: Self-pay | Admitting: Student

## 2021-03-31 DIAGNOSIS — D649 Anemia, unspecified: Secondary | ICD-10-CM

## 2021-03-31 LAB — PSA: Prostatic Specific Antigen: >3000 ng/mL — ABNORMAL HIGH (ref 0.00–4.00)

## 2021-03-31 LAB — CBC
HCT: 25.8 % — ABNORMAL LOW (ref 39.0–52.0)
Hemoglobin: 8.6 g/dL — ABNORMAL LOW (ref 13.0–17.0)
MCH: 25.6 pg — ABNORMAL LOW (ref 26.0–34.0)
MCHC: 33.3 g/dL (ref 30.0–36.0)
MCV: 76.8 fL — ABNORMAL LOW (ref 80.0–100.0)
Platelets: 115 10*3/uL — ABNORMAL LOW (ref 150–400)
RBC: 3.36 MIL/uL — ABNORMAL LOW (ref 4.22–5.81)
RDW: 20 % — ABNORMAL HIGH (ref 11.5–15.5)
WBC: 5.5 10*3/uL (ref 4.0–10.5)
nRBC: 0.4 % — ABNORMAL HIGH (ref 0.0–0.2)

## 2021-03-31 LAB — BASIC METABOLIC PANEL
Anion gap: 4 — ABNORMAL LOW (ref 5–15)
BUN: 8 mg/dL (ref 6–20)
CO2: 21 mmol/L — ABNORMAL LOW (ref 22–32)
Calcium: 7.5 mg/dL — ABNORMAL LOW (ref 8.9–10.3)
Chloride: 101 mmol/L (ref 98–111)
Creatinine, Ser: 0.53 mg/dL — ABNORMAL LOW (ref 0.61–1.24)
GFR, Estimated: >60 mL/min (ref >60)
Glucose, Bld: 71 mg/dL (ref 70–99)
Potassium: 3.8 mmol/L (ref 3.5–5.1)
Sodium: 126 mmol/L — ABNORMAL LOW (ref 135–145)

## 2021-03-31 LAB — TSH: TSH: 2.649 u[IU]/mL (ref 0.350–4.500)

## 2021-03-31 LAB — VITAMIN D 25 HYDROXY (VIT D DEFICIENCY, FRACTURES): Vit D, 25-Hydroxy: 29.48 ng/mL — ABNORMAL LOW (ref 30–100)

## 2021-03-31 MED ORDER — MIDODRINE HCL 5 MG PO TABS
5.0000 mg | ORAL_TABLET | Freq: Once | ORAL | Status: AC
  Administered 2021-03-31: 5 mg via ORAL
  Filled 2021-03-31: qty 1

## 2021-03-31 MED ORDER — MIDODRINE HCL 5 MG PO TABS
10.0000 mg | ORAL_TABLET | Freq: Three times a day (TID) | ORAL | Status: DC
  Administered 2021-03-31 – 2021-05-14 (×107): 10 mg via ORAL
  Filled 2021-03-31 (×112): qty 2

## 2021-03-31 MED ORDER — SODIUM CHLORIDE 0.9 % IV BOLUS
500.0000 mL | Freq: Once | INTRAVENOUS | Status: AC
  Administered 2021-03-31: 500 mL via INTRAVENOUS

## 2021-03-31 MED ORDER — MIDODRINE HCL 5 MG PO TABS
5.0000 mg | ORAL_TABLET | Freq: Three times a day (TID) | ORAL | Status: DC | PRN
  Administered 2021-03-31: 5 mg via ORAL
  Filled 2021-03-31: qty 1

## 2021-03-31 MED ORDER — SODIUM CHLORIDE 0.9 % IV SOLN
4.0000 mg | Freq: Once | INTRAVENOUS | Status: AC
  Administered 2021-03-31: 4 mg via INTRAVENOUS
  Filled 2021-03-31: qty 5

## 2021-03-31 MED ORDER — VITAMIN D (ERGOCALCIFEROL) 1.25 MG (50000 UNIT) PO CAPS
50000.0000 [IU] | ORAL_CAPSULE | ORAL | Status: AC
  Administered 2021-03-31 – 2021-05-19 (×5): 50000 [IU] via ORAL
  Filled 2021-03-31 (×8): qty 1

## 2021-03-31 MED ORDER — SODIUM CHLORIDE 0.9 % IV SOLN: INTRAVENOUS | Status: AC

## 2021-03-31 NOTE — Progress Notes (Signed)
Per Charge RN Opal Sidles, okay to take pt to floor at this time in wheelchair. Will place patient belongings in a bag for transport.

## 2021-03-31 NOTE — Consult Note (Signed)
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  INTERVAL HISTORY: Patient is a 60 year old male who presented to the emergency room complaining of right hip pain.  She has a history of polysubstance abuse as well as right femur fracture approximately 1 year ago.  He was found to have a significant anemia with a hemoglobin of 5.6 and a mildly reduced platelet count of 121.  Further work-up revealed widespread bony disease concerning for metastasis.  Patient also reports approximately 30 pound unintentional weight loss over the past year.  He complains of chronic right foot numbness, but no other neurologic complaints.  He has no chest pain, shortness of breath, cough, or hemoptysis.  He denies any nausea, vomiting, constipation, or diarrhea.  He has no urinary complaints.  Patient feels generally terrible, but offers no further specific complaints today.  REVIEW OF SYSTEMS:   Review of Systems  Constitutional:  Positive for malaise/fatigue and weight loss. Negative for fever.  Respiratory: Negative.  Negative for cough, hemoptysis and shortness of breath.   Cardiovascular: Negative.  Negative for chest pain and leg swelling.  Gastrointestinal: Negative.  Negative for abdominal pain.  Genitourinary: Negative.  Negative for dysuria.  Musculoskeletal:  Positive for back pain.  Skin: Negative.  Negative for itching and rash.  Neurological:  Positive for sensory change, focal weakness and weakness. Negative for dizziness and headaches.  Psychiatric/Behavioral:  Positive for substance abuse. The patient is not nervous/anxious.    As per HPI. Otherwise, a complete review of systems is negative.  PAST MEDICAL HISTORY: Past Medical History:  Diagnosis Date   Adenomatous  colon polyp    Anemia    Depression    Elevated LFTs     PAST SURGICAL HISTORY: Past Surgical History:  Procedure Laterality Date   FRACTURE SURGERY     INTRAMEDULLARY (IM) NAIL INTERTROCHANTERIC Right 04/02/2020   Procedure: INTRAMEDULLARY (IM) NAIL INTERTROCHANTRIC;  Surgeon: Corky Mull, MD;  Location: [facility] ORS;  Service: Orthopedics;  Laterality: Right;    FAMILY HISTORY: Family History  Problem Relation Age of Onset   Heart disease Other    Colon polyps Cousin        maternal   Colon polyps Cousin    Colon polyps Maternal Aunt    Colon cancer Maternal Grandfather     ADVANCED DIRECTIVES (Y/N):  $[email or web address]$ @  HEALTH MAINTENANCE: Social History   Tobacco Use   Smoking status: Some Days    Pack years: 0.00   Smokeless tobacco: Never  Substance Use Topics   Alcohol use: Yes    Comment: 4 beers weekly   Drug use: No     Colonoscopy:  PAP:  Bone density:  Lipid panel:  No Known Allergies  Current Facility-Administered Medications  Medication Dose Route Frequency Provider Last Rate Last Admin   0.9 %  sodium chloride infusion  10 mL/hr Intravenous Once Cox, Amy N, DO   Held at 03/30/21 1314   0.9 %  sodium chloride infusion   Intravenous Continuous Val Riles, MD 75 mL/hr at 03/31/21 0910 New Bag at 03/31/21 0910   acetaminophen (TYLENOL) tablet 650 mg  650 mg Oral Q6H PRN Cox, Amy N, DO       Or   acetaminophen (TYLENOL) suppository 650 mg  650 mg Rectal Q6H PRN Cox, Amy N,  DO       enoxaparin (LOVENOX) injection 40 mg  40 mg Subcutaneous Q24H Cox, Amy N, DO   40 mg at 03/30/21 2205   feeding supplement (ENSURE ENLIVE / ENSURE PLUS) liquid 237 mL  237 mL Oral TID BM Cox, Amy N, DO       ferrous sulfate tablet 325 mg  325 mg Oral Q breakfast Cox, Amy N, DO   325 mg at 92/01/00 7121   folic acid (FOLVITE) tablet 1 mg  1 mg Oral Daily Cox, Amy N, DO   1 mg at 03/31/21 9758   ketorolac (TORADOL) 30 MG/ML injection 30 mg  30 mg Intravenous Q8H PRN Cox, Amy N, DO        midodrine (PROAMATINE) tablet 10 mg  10 mg Oral TID WC Val Riles, MD   10 mg at 03/31/21 1237   multivitamin with minerals tablet 1 tablet  1 tablet Oral Daily Cox, Amy N, DO   1 tablet at 03/31/21 0909   ondansetron (ZOFRAN) tablet 4 mg  4 mg Oral Q6H PRN Cox, Amy N, DO       Or   ondansetron (ZOFRAN) injection 4 mg  4 mg Intravenous Q6H PRN Cox, Amy N, DO       Current Outpatient Medications  Medication Sig Dispense Refill   acetaminophen (TYLENOL) 325 MG tablet Take 1-2 tablets (325-650 mg total) by mouth every 6 (six) hours as needed for mild pain (pain score 1-3 or temp > 100.5). 90 tablet 0   aspirin EC 325 MG tablet Take 1 tablet (325 mg total) by mouth daily. (Patient not taking: Reported on 03/30/2021) 30 tablet 0   diclofenac Sodium (VOLTAREN) 1 % GEL Apply 2 g topically 4 (four) times daily. 150 g 1   docusate sodium (COLACE) 100 MG capsule Take 1 capsule (100 mg total) by mouth 2 (two) times daily. 10 capsule 0   feeding supplement, ENSURE ENLIVE, (ENSURE ENLIVE) LIQD Take 237 mLs by mouth 3 (three) times daily between meals. 237 mL 12   ferrous sulfate 325 (65 FE) MG tablet Take 1 tablet (325 mg total) by mouth daily with breakfast. 90 tablet 3   folic acid (FOLVITE) 1 MG tablet Take 1 tablet (1 mg total) by mouth daily. 90 tablet 1   Multiple Vitamin (MULTIVITAMIN WITH MINERALS) TABS tablet Take 1 tablet by mouth daily. 90 tablet 1   oxyCODONE (OXY IR/ROXICODONE) 5 MG immediate release tablet Take 1-2 tablets (5-10 mg total) by mouth every 4 (four) hours as needed for moderate pain (pain score 4-6). 60 tablet 0   traMADol (ULTRAM) 50 MG tablet Take 1 tablet (50 mg total) by mouth every 6 (six) hours. 30 tablet 0   Vitamin D, Ergocalciferol, (DRISDOL) 1.25 MG (50000 UNIT) CAPS capsule Take 1 capsule (50,000 Units total) by mouth every 7 (seven) days. 5 capsule 0    OBJECTIVE: Vitals:   03/31/21 1130 03/31/21 1230  BP: (!) 88/48 93/61  Pulse: 72   Resp:    Temp:  97.7  F (36.5 C)  SpO2: 100%      Body mass index is 15.42 kg/m.    ECOG FS:3 - Symptomatic, >50% confined to bed  General: Thin, no acute distress. Eyes: Pink conjunctiva, anicteric sclera. HEENT: Normocephalic, moist mucous membranes. Lungs: No audible wheezing or coughing. Heart: Regular rate and rhythm. Abdomen: Soft, nontender, no obvious distention. Musculoskeletal: No edema, cyanosis, or clubbing. Neuro: Alert, answering all questions appropriately. Cranial nerves grossly  intact. Skin: No rashes or petechiae noted. Psych: Normal affect.   LAB RESULTS:  Lab Results  Component Value Date   NA 126 (L) 03/31/2021   K 3.8 03/31/2021   CL 101 03/31/2021   CO2 21 (L) 03/31/2021   GLUCOSE 71 03/31/2021   BUN 8 03/31/2021   CREATININE 0.53 (L) 03/31/2021   CALCIUM 7.5 (L) 03/31/2021   PROT 6.2 (L) 03/30/2021   ALBUMIN 2.9 (L) 03/30/2021   AST 51 (H) 03/30/2021   ALT 8 03/30/2021   ALKPHOS 884 (H) 03/30/2021   BILITOT 0.6 03/30/2021   GFRNONAA >60 03/31/2021   GFRAA >60 04/06/2020    Lab Results  Component Value Date   WBC 5.5 03/31/2021   NEUTROABS 4.3 03/30/2021   HGB 8.6 (L) 03/31/2021   HCT 25.8 (L) 03/31/2021   MCV 76.8 (L) 03/31/2021   PLT 115 (L) 03/31/2021     STUDIES: DG Chest 2 View  Result Date: 03/30/2021 CLINICAL DATA:  Post hip surgery EXAM: CHEST - 2 VIEW COMPARISON:  June 2021 FINDINGS: No consolidation or edema. No pleural effusion. No pneumothorax. Normal heart size. There is a diffusely abnormal appearance to included osseous structures with a mottled appearance. There are multiple thoracic compression fractures. IMPRESSION: Diffusely abnormal appearance of osseous structures concerning for a marrow infiltrative process. Multiple thoracic compression fractures are likely pathologic. Electronically Signed   By: [name] M.D.   On: 03/30/2021 12:04   CT CHEST ABDOMEN PELVIS W CONTRAST  Result Date: 03/30/2021 CLINICAL DATA:  Unintended weight  loss in a 60 year old male, abnormal x-ray. EXAM: CT CHEST, ABDOMEN, AND PELVIS WITH CONTRAST TECHNIQUE: Multidetector CT imaging of the chest, abdomen and pelvis was performed following the standard protocol during bolus administration of intravenous contrast. CONTRAST:  156mL OMNIPAQUE IOHEXOL 300 MG/ML  SOLN COMPARISON:  Previous radiographs of the hip and prior neuro imaging, no cross-sectional imaging of the chest, abdomen or pelvis is available for review. FINDINGS: CT CHEST FINDINGS Cardiovascular: Normal caliber of the thoracic aorta. Normal heart size. Normal caliber central pulmonary vessels. Mediastinum/Nodes: No discrete adenopathy in the mediastinum. Soft tissue about the upper thoracic spine, see below. Esophagus mildly patulous. No axillary lymphadenopathy. No thoracic inlet lymphadenopathy. Lungs/Pleura: No suspicious pulmonary mass. No consolidation or sign of pleural effusion. Airways are patent. Musculoskeletal: Diffuse sclerotic metastatic disease with areas of bony destruction, sclerosis mixed with lytic changes. Soft tissue about the T3 level with mild loss of height at T3. Thickness of soft tissue approximately 11 mm to the LEFT of the vertebral body measuring 3.3 cm in greatest axial dimension. To a lesser extent there is soft tissue on the contralateral side along the medial RIGHT posterior mediastinum. Multiple rib fractures of varying ages with limited callus formation at most sites but with subacute features and or chronic features throughout the RIGHT chest. Fractures of ribs 3, 4, 5, 6, 7, 8, 9, 10 and 11 posteriorly. Sternum is diffusely involved as are all others skeletal structures within the visualized axial and appendicular skeleton but is intact otherwise. CT ABDOMEN PELVIS FINDINGS Hepatobiliary: No focal, suspicious hepatic lesion. No biliary duct dilation. Pancreas: Normal, without mass, inflammation or ductal dilatation. Spleen: Spleen normal size and contour. Adrenals/Urinary  Tract: Adrenal glands are normal. Symmetric renal enhancement. No hydronephrosis. Smooth contour of the urinary bladder. Stomach/Bowel: No acute gastrointestinal findings. Normal appendix gas filled in the RIGHT lower quadrant. Scattered gas-filled loops of small bowel throughout the abdomen. Vascular/Lymphatic: Patent abdominal vessels. No aneurysmal dilation. There is  no gastrohepatic or hepatoduodenal ligament lymphadenopathy. No retroperitoneal or mesenteric lymphadenopathy. No pelvic sidewall lymphadenopathy. Reproductive: Prostate is un enlarged not well evaluated. Other: Hazy infiltration of LEFT upper quadrant fat and omentum without discrete measurable abnormality. Small volume ascites. No free air. Musculoskeletal: Diffuse skeletal metastatic disease with pathologic fractures of RIGHT-sided ribs, likely T3 vertebral body, also loss of height in the lower thoracic spine at T8 through T11 and subacute or chronic spinous process fracture at C7. L3 compression of superior endplate. Callus formation about a RIGHT femoral fracture of the proximal femur post ORIF. IMPRESSION: 1. Diffuse skeletal metastatic disease with signs of pathologic fracture of multiple RIGHT-sided ribs which appears subacute. 2. Loss of height at multiple levels in the spine likely related to pathologic fracture more likely subacute or chronic. Soft tissue about the upper thoracic spine where there is loss of height of T3 has an appearance more suggestive of metastatic disease than adjacent hematoma. Correlate with any pain in this location. Consider spinal MRI for further assessment as warranted. 3. No discrete site of primary neoplasm is visualized. Prostate is considered based on the appearance of bony sclerosis and diffuse nature of above findings. Correlation with PSA could potentially be helpful as an initial means of further evaluating this patient. 4. Spleen is normal size and there is no sign of adenopathy 5. Subtle infiltration  of omental fat may be indicative of early peritoneal disease, attention on follow-up. 6. Small volume ascites. 7. Callus formation about a RIGHT femoral fracture of the proximal femur post ORIF. These results will be called to the ordering clinician or representative by the Radiologist Assistant, and communication documented in the PACS or [employer]. Electronically Signed   By: Zetta Bills M.D.   On: 03/30/2021 16:55   DG Hip Unilat W or Wo Pelvis 2-3 Views Right  Result Date: 03/30/2021 CLINICAL DATA:  Right hip pain.  ORIF. EXAM: DG HIP (WITH OR WITHOUT PELVIS) 2-3V RIGHT COMPARISON:  04/02/2020. FINDINGS: Diffuse severe osteopenia with diffuse mottled lucencies noted throughout the bony structures of the visualized lumbar spine and pelvis. A process such as myeloma or metastatic disease cannot be excluded. What appears to be at L5 compression fracture noted. Lumbar spine series can be obtained for confirmation. No other acute bony abnormality identified. Postsurgical changes about the right hip. Hardware intact. Anatomic alignment. IMPRESSION: Diffuse severe osteopenia with diffuse mild wall lucencies noted throughout the bony structures of the visualized lumbar spine and pelvis. A process such as myeloma or metastatic disease cannot be excluded. 2. What appears to be a L5 compression fracture noted. Lumbar spine series can be obtained for confirmation. No other acute bony abnormality identified. 3. Postsurgical changes right hip. Hardware intact. Electronically Signed   By: Marcello Moores  Register   On: 03/30/2021 11:59    ASSESSMENT: Wide-spread bony disease concerning for metastatic prostate cancer.  PLAN:    Wide-spread bony disease concerning for metastatic prostate cancer: CT scan results from yesterday reviewed independently and reported as above with widespread bony disease, but no obvious visceral lesions.  Patient's PSA is greater than 3000.  Will discuss with pathology benefit of doing a  biopsy and will consider treatment after the conversation.  In the meantime, Zometa has been ordered for his bony disease.  Severe anemia: Improved with blood transfusion: Patient only has mildly decreased iron stores as well as a decreased folate level.  B12 is within normal limits.  Given the extent of patient's bony disease, he may have marrow  infiltration and a bone marrow biopsy may be warranted.  Discussion with pathology as above.  Continue to monitor and maintain hemoglobin greater than 8.0. Thrombocytopenia: Mild, monitor. Hypocalcemia: Given patient's decreased albumin, calcium levels corrected to near normal.  Proceed with Zometa as above. Substance abuse: Patient urine drug screen positive for cocaine.  He expressed interest in entering inpatient rehab.  Appreciate consult, will follow.    Lloyd Huger, MD   03/31/2021 2:03 PM

## 2021-03-31 NOTE — Progress Notes (Signed)
[facility] Progress Note  Patient: Ryan [name]    RJJ:[id number]  DOA: 03/30/2021     Date of Service: the patient was seen and examined on 03/31/2021  Chief Complaint  Patient presents with   Pain Management   Brief hospital course: Ryan [name] is a 60 y.o. male with medical history significant for history of polysubstance abuse including THC, cocaine, alcohol, remote history of tobacco use, history of right proximal femur inter trochanteric fracture status post right IM nail intertrochanteric fixation in 04/02/2021, does not follow-up with a PCP, presents to the emergency department for chief concerns of right hip pain.  Pain is chronic for several months, denies any trauma, difficulty ambulation due to pain. He also endorses an unintentional 35 pound weight loss in the last year, denies bright red blood per rectum and melena. ED work-up, hypotensive, saturating well on room air, Hb 5.6, WBC 6.2, platelet 121, serum sodium 126, potassium 3.9, chloride 97, bicarb 17, BUN 9, serum creatinine of 0.64, nonfasting blood glucose 86.  eGFR is greater than 60. AST 51, ALT 8. Alk phos elevated at 884.  Currently further plan is monitor H&H, PT and OT eval, oncology consult for further recommendation..  Assessment and Plan: Principal Problem:   Symptomatic anemia Active Problems:   Closed right hip fracture (HCC)   Hyponatremia   Closed rib fracture   Acute anemia   Thrombocytopenia (HCC)   Thoracic compression fracture, closed, initial encounter [facility])   Pathologic fracture of thoracic vertebrae, initial encounter   Malnourished ([place])   Protein-calorie malnutrition, severe ([place])   Cancer cachexia ([place])   # Severe symptomatic hypochromic, normocytic, anemia-suspect secondary to underlying malignancy S/p 2 units of PRBC given on 6/21, Hb 8.6 posttransfusion Iron within normal range, B12 within normal range, Folate deficiency, folic acid level 5.8, sinew oral supplement # X-ray read of  possible myeloma or metastatic disease # Several thoracic compression fracture, possible pathologic - Patient endorses to me that he would like to speak to an oncologist to discuss his prognosis, and options at this time - Myeloma panel has been ordered per oncologist request - Follow CBC daily - [name] has been consulted and will see the patient PSA levels greater than 3000, CT scan showed widespread bony metastasis, suspected due to metastatic prostate cancer.  Patient will need further work-up/biopsy and follow-up with hematology as an outpatient    # Borderline hypotension-responsive to fluid, suspect secondary to chronic severe anemia - Low clinical suspicion for sepsis at this time, patient does not have elevated WBC, fever, increased respiration rate Patient was given IV fluid bolus 500 mL x 2 Started NS 70 mill per hour Midodrine 10 mg p.o. 3 times daily with holding parameters Continue to monitor BP     # History of right proximal femur intratrochanteric fracture in June 2021 -Status post IM nail intertrochanteric fixation on 04/02/2021 -He followed up with Dr. Roland Rack, and his staples were removed on 7/84/2021   # Thrombocytopenia-suspect related to metastatic cancer Monitor platelet count daily   # Protein/calorie malnutrition - Ensure 3 times daily resumed - Dietitian has been consulted   # History of polysubstance abuse   # Nursing discovered bedbug infestation   Pain control: Ketorolac 30 mg IV every 8 hours as needed for moderate pain, 2 days ordered; morphine 2 mg IV every 4 hours as needed for severe pain, 1 day ordered   Body mass index is 15.42 kg/m.  Interventions:    Diet: Regular DVT Prophylaxis:  Subcutaneous Lovenox   Advance goals of care discussion: Full code  Family Communication: family was not present at bedside, at the time of interview.  The pt provided permission to discuss medical plan with the family. Opportunity was given to ask  question and all questions were answered satisfactorily.   Disposition:  Pt is from Home, admitted with right hip pain and symptomatic anemia, still has right hip pain, found to have metastatic cancer, which precludes a safe discharge. Discharge to home, when H&H remained stable and pain is under control PT and OT eval pending for disposition plan  Subjective: Patient is feeling improvement in the right hip pain, symptoms improved after blood transfusion.  Patient was anxious to know what kind of cancer he has, recommended to wait for oncology consult.  Physical Exam: General:  alert oriented to time, place, and person.  Appear in mild distress, affect appropriate Eyes: PERRLA ENT: Oral Mucosa Clear, moist  Neck: no JVD,  Cardiovascular: S1 and S2 Present, no Murmur,  Respiratory: good respiratory effort, Bilateral Air entry equal and Decreased, no Crackles, no wheezes Abdomen: Bowel Sound present, Soft and no tenderness,  Skin: no rashes Extremities: no Pedal edema, no calf tenderness Neurologic: without any new focal findings Gait not checked due to patient safety concerns  Vitals:   03/31/21 1130 03/31/21 1230 03/31/21 1430 03/31/21 1530  BP: (!) 88/48 93/61 103/65 127/70  Pulse: 72   68  Resp:    18  Temp:  97.7 F (36.5 C)  99 F (37.2 C)  TempSrc:  Oral    SpO2: 100%  100% 100%  Weight:      Height:        Intake/Output Summary (Last 24 hours) at 03/31/2021 1622 Last data filed at 03/30/2021 2110 Gross per 24 hour  Intake 330 ml  Output --  Net 330 ml   Filed Weights   03/30/21 1030  Weight: 59 kg    Data Reviewed: I have personally reviewed and interpreted daily labs, tele strips, imagings as discussed above. I reviewed all nursing notes, pharmacy notes, vitals, pertinent old records I have discussed plan of care as described above with RN and patient/family.  CBC: Recent Labs  Lab 03/30/21 1135 03/30/21 2127 03/31/21 0349  WBC 6.2 5.8 5.5  NEUTROABS  4.3  --   --   HGB 5.6* 8.7* 8.6*  HCT 18.5* 26.3* 25.8*  MCV 80.1 77.6* 76.8*  PLT 121* 112* 124*   Basic Metabolic Panel: Recent Labs  Lab 03/30/21 1135 03/31/21 0349  NA 126* 126*  K 3.9 3.8  CL 97* 101  CO2 17* 21*  GLUCOSE 86 71  BUN 9 8  CREATININE 0.64 0.53*  CALCIUM 8.0* 7.5*    Studies: No results found.  Scheduled Meds:  enoxaparin (LOVENOX) injection  40 mg Subcutaneous Q24H   feeding supplement  237 mL Oral TID BM   ferrous sulfate  325 mg Oral Q breakfast   folic acid  1 mg Oral Daily   midodrine  10 mg Oral TID WC   multivitamin with minerals  1 tablet Oral Daily   Continuous Infusions:  sodium chloride Stopped (03/30/21 1314)   sodium chloride 75 mL/hr at 03/31/21 0910   PRN Meds: acetaminophen **OR** acetaminophen, ketorolac, ondansetron **OR** ondansetron (ZOFRAN) IV  Time spent: 35 minutes  Author: Val Riles. MD [facility] 03/31/2021 4:22 PM  To reach On-call, see care teams to locate the attending and reach out to them via [email or web address]. If  7PM-7AM, please contact night-coverage If you still have difficulty reaching the attending provider, please page the [facility] (Director on Call) for [facility] on amion for assistance.

## 2021-03-31 NOTE — ED Notes (Signed)
Pt sleeping. 

## 2021-04-01 ENCOUNTER — Inpatient Hospital Stay: Payer: Medicaid Other

## 2021-04-01 LAB — CBC WITH DIFFERENTIAL/PLATELET
Abs Immature Granulocytes: 0.07 10*3/uL (ref 0.00–0.07)
Basophils Absolute: 0 10*3/uL (ref 0.0–0.1)
Basophils Relative: 0 %
Eosinophils Absolute: 0.1 10*3/uL (ref 0.0–0.5)
Eosinophils Relative: 1 %
HCT: 20.9 % — ABNORMAL LOW (ref 39.0–52.0)
Hemoglobin: 7.1 g/dL — ABNORMAL LOW (ref 13.0–17.0)
Immature Granulocytes: 1 %
Lymphocytes Relative: 28 %
Lymphs Abs: 1.4 10*3/uL (ref 0.7–4.0)
MCH: 26 pg (ref 26.0–34.0)
MCHC: 34 g/dL (ref 30.0–36.0)
MCV: 76.6 fL — ABNORMAL LOW (ref 80.0–100.0)
Monocytes Absolute: 0.4 10*3/uL (ref 0.1–1.0)
Monocytes Relative: 8 %
Neutro Abs: 3.1 10*3/uL (ref 1.7–7.7)
Neutrophils Relative %: 62 %
Platelets: 101 10*3/uL — ABNORMAL LOW (ref 150–400)
RBC: 2.73 MIL/uL — ABNORMAL LOW (ref 4.22–5.81)
RDW: 20.2 % — ABNORMAL HIGH (ref 11.5–15.5)
Smear Review: NORMAL
WBC: 5 10*3/uL (ref 4.0–10.5)
nRBC: 0 % (ref 0.0–0.2)

## 2021-04-01 LAB — BASIC METABOLIC PANEL
Anion gap: 5 (ref 5–15)
BUN: 6 mg/dL (ref 6–20)
CO2: 19 mmol/L — ABNORMAL LOW (ref 22–32)
Calcium: 6.9 mg/dL — ABNORMAL LOW (ref 8.9–10.3)
Chloride: 106 mmol/L (ref 98–111)
Creatinine, Ser: 0.59 mg/dL — ABNORMAL LOW (ref 0.61–1.24)
GFR, Estimated: >60 mL/min (ref >60)
Glucose, Bld: 92 mg/dL (ref 70–99)
Potassium: 4 mmol/L (ref 3.5–5.1)
Sodium: 130 mmol/L — ABNORMAL LOW (ref 135–145)

## 2021-04-01 LAB — HEPATIC FUNCTION PANEL
ALT: 6 U/L (ref 0–44)
AST: 45 U/L — ABNORMAL HIGH (ref 15–41)
Albumin: 1.9 g/dL — ABNORMAL LOW (ref 3.5–5.0)
Alkaline Phosphatase: 593 U/L — ABNORMAL HIGH (ref 38–126)
Bilirubin, Direct: 0.2 mg/dL (ref 0.0–0.2)
Indirect Bilirubin: 0.4 mg/dL (ref 0.3–0.9)
Total Bilirubin: 0.6 mg/dL (ref 0.3–1.2)
Total Protein: 4.5 g/dL — ABNORMAL LOW (ref 6.5–8.1)

## 2021-04-01 LAB — PROTEIN ELECTROPHORESIS, SERUM
A/G Ratio: 1 (ref 0.7–1.7)
Albumin ELP: 2.4 g/dL — ABNORMAL LOW (ref 2.9–4.4)
Alpha-1-Globulin: 0.2 g/dL (ref 0.0–0.4)
Alpha-2-Globulin: 0.6 g/dL (ref 0.4–1.0)
Beta Globulin: 0.5 g/dL — ABNORMAL LOW (ref 0.7–1.3)
Gamma Globulin: 0.9 g/dL (ref 0.4–1.8)
Globulin, Total: 2.3 g/dL (ref 2.2–3.9)
Total Protein ELP: 4.7 g/dL — ABNORMAL LOW (ref 6.0–8.5)

## 2021-04-01 LAB — MAGNESIUM: Magnesium: 1.1 mg/dL — ABNORMAL LOW (ref 1.7–2.4)

## 2021-04-01 LAB — IGG, IGA, IGM
IgA: 330 mg/dL (ref 90–386)
IgG (Immunoglobin G), Serum: 995 mg/dL (ref 603–1613)
IgM (Immunoglobulin M), Srm: 52 mg/dL (ref 20–172)

## 2021-04-01 LAB — PREPARE RBC (CROSSMATCH)

## 2021-04-01 LAB — KAPPA/LAMBDA LIGHT CHAINS
Kappa free light chain: 41.2 mg/L — ABNORMAL HIGH (ref 3.3–19.4)
Kappa, lambda light chain ratio: 1.54 (ref 0.26–1.65)
Lambda free light chains: 26.8 mg/L — ABNORMAL HIGH (ref 5.7–26.3)

## 2021-04-01 LAB — GLUCOSE, CAPILLARY: Glucose-Capillary: 108 mg/dL — ABNORMAL HIGH (ref 70–99)

## 2021-04-01 LAB — PHOSPHORUS: Phosphorus: 3 mg/dL (ref 2.5–4.6)

## 2021-04-01 MED ORDER — SODIUM CHLORIDE 0.9 % IV BOLUS
500.0000 mL | Freq: Once | INTRAVENOUS | Status: AC
  Administered 2021-04-01: 500 mL via INTRAVENOUS

## 2021-04-01 MED ORDER — DEGARELIX ACETATE(240 MG DOSE) 120 MG/VIAL ~~LOC~~ SOLR
240.0000 mg | Freq: Once | SUBCUTANEOUS | Status: AC
  Administered 2021-04-02: 240 mg via SUBCUTANEOUS
  Filled 2021-04-01: qty 6

## 2021-04-01 MED ORDER — MAGNESIUM SULFATE 2 GM/50ML IV SOLN
2.0000 g | INTRAVENOUS | Status: AC
  Administered 2021-04-01 (×2): 2 g via INTRAVENOUS
  Filled 2021-04-01 (×2): qty 50

## 2021-04-01 MED ORDER — POLYETHYLENE GLYCOL 3350 17 G PO PACK
17.0000 g | PACK | Freq: Every day | ORAL | Status: DC
  Administered 2021-04-02 – 2021-06-02 (×12): 17 g via ORAL
  Filled 2021-04-01 (×32): qty 1

## 2021-04-01 MED ORDER — BISACODYL 5 MG PO TBEC: 5.0000 mg | DELAYED_RELEASE_TABLET | Freq: Every day | ORAL | Status: DC | PRN

## 2021-04-01 MED ORDER — TRAZODONE HCL 50 MG PO TABS
50.0000 mg | ORAL_TABLET | Freq: Every evening | ORAL | Status: DC | PRN
  Administered 2021-04-01 – 2021-05-03 (×24): 50 mg via ORAL
  Filled 2021-04-01 (×25): qty 1

## 2021-04-01 MED ORDER — BISACODYL 10 MG RE SUPP: 10.0000 mg | Freq: Every day | RECTAL | Status: DC | PRN

## 2021-04-01 MED ORDER — MELATONIN 5 MG PO TABS
5.0000 mg | ORAL_TABLET | Freq: Every day | ORAL | Status: DC
  Administered 2021-04-01 – 2021-06-01 (×60): 5 mg via ORAL
  Filled 2021-04-01 (×63): qty 1

## 2021-04-01 MED ORDER — SODIUM CHLORIDE 0.9% IV SOLUTION: Freq: Once | INTRAVENOUS | Status: AC

## 2021-04-01 NOTE — Progress Notes (Signed)
[facility] Progress Note  Patient: Ryan [name]    MCN:[id number]  DOA: 03/30/2021     Date of Service: the patient was seen and examined on 04/01/2021  Chief Complaint  Patient presents with   Pain Management   Brief hospital course: Ryan [name] is a 60 y.o. male with medical history significant for history of polysubstance abuse including THC, cocaine, alcohol, remote history of tobacco use, history of right proximal femur inter trochanteric fracture status post right IM nail intertrochanteric fixation in 04/02/2021, does not follow-up with a PCP, presents to the emergency department for chief concerns of right hip pain.  Pain is chronic for several months, denies any trauma, difficulty ambulation due to pain. He also endorses an unintentional 35 pound weight loss in the last year, denies bright red blood per rectum and melena. ED work-up, hypotensive, saturating well on room air, Hb 5.6, WBC 6.2, platelet 121, serum sodium 126, potassium 3.9, chloride 97, bicarb 17, BUN 9, serum creatinine of 0.64, nonfasting blood glucose 86.  eGFR is greater than 60. AST 51, ALT 8. Alk phos elevated at 884.  Currently further plan is monitor H&H, PT and OT eval, oncology consult for further recommendation..  Assessment and Plan: Principal Problem:   Symptomatic anemia Active Problems:   Closed right hip fracture (HCC)   Hyponatremia   Closed rib fracture   Acute anemia   Thrombocytopenia (HCC)   Thoracic compression fracture, closed, initial encounter [name])   Pathologic fracture of thoracic vertebrae, initial encounter   Malnourished ([place])   Protein-calorie malnutrition, severe ([place])   Cancer cachexia ([place])   # Severe symptomatic hypochromic, normocytic, anemia-suspect secondary to underlying malignancy S/p 2 units of PRBC given on 6/21, Hb 8.6 posttransfusion Iron within normal range, B12 within normal range, Folate deficiency, folic acid level 5.8, sinew oral supplement # X-ray read of  possible myeloma or metastatic disease # Several thoracic compression fracture, possible pathologic - Patient endorses to me that he would like to speak to an oncologist to discuss his prognosis, and options at this time - Myeloma panel has been ordered per oncologist request - Follow CBC daily - 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  Patient will need further work-up/biopsy and follow-up with hematology as an outpatient 6/23 Hb 7.1 transfused 1 unit PRBC Check FOBT    # Hypotension-responsive to fluid, suspect secondary to chronic severe anemia - Low clinical suspicion for sepsis at this time, patient does not have elevated WBC, fever, increased respiration rate Patient was given IV fluid bolus 500 mL x 2 Started NS 70 mill per hour Midodrine 10 mg p.o. 3 times daily with holding parameters Continue to monitor BP     # History of right proximal femur intratrochanteric fracture in June 2021 -Status post IM nail intertrochanteric fixation on 04/02/2021 -He followed up with Dr. Roland Rack, and his staples were removed on 7/84/2021   # Thrombocytopenia-suspect related to metastatic cancer Monitor platelet count daily   # Protein/calorie malnutrition - Ensure 3 times daily resumed - Dietitian has been consulted   # History of polysubstance abuse, abstinence counseling done   # Nursing discovered bedbug infestation   Pain control: Ketorolac 30 mg IV every 8 hours as needed for moderate pain, 2 days ordered; morphine 2 mg IV every 4 hours as needed for severe pain, 1 day ordered   Body  mass index is 15.42 kg/m.  Interventions:    Diet: Regular DVT Prophylaxis: Subcutaneous Lovenox   Advance goals of care discussion: Full code  Family Communication: family was not present at bedside, at the time of interview.  The pt provided permission  to discuss medical plan with the family. Opportunity was given to ask question and all questions were answered satisfactorily.   Disposition:  Pt is from Home, admitted with right hip pain and symptomatic anemia, still has right hip pain, found to have metastatic cancer, which precludes a safe discharge. Discharge to home, when H&H remained stable and pain is under control PT and OT eval pending for disposition plan  Subjective: No acute overnight issues, patient was complaining of having insomnia, could not sleep much last night secondary to multiple environmental reasons.  Patient's pain is under control. Patient was informed that we are awaiting for further work-up by oncologist   Physical Exam: General:  alert oriented to time, place, and person.  Appear in mild distress, affect appropriate Eyes: PERRLA ENT: Oral Mucosa Clear, moist  Neck: no JVD,  Cardiovascular: S1 and S2 Present, no Murmur,  Respiratory: good respiratory effort, Bilateral Air entry equal and Decreased, no Crackles, no wheezes Abdomen: Bowel Sound present, Soft and no tenderness,  Skin: no rashes Extremities: no Pedal edema, no calf tenderness Neurologic: without any new focal findings Gait not checked due to patient safety concerns  Vitals:   04/01/21 0601 04/01/21 0614 04/01/21 0747 04/01/21 1154  BP: (!) 89/58 (!) 92/54 (!) 87/49 (!) 97/59  Pulse: 79 76 80 77  Resp:  _0 Temp:   99.6 F (37.6 C) 98.4 F (36.9 C)  TempSrc:   Oral Oral  SpO2:   97% 98%  Weight:      Height:        Intake/Output Summary (Last 24 hours) at 04/01/2021 1229 Last data filed at 04/01/2021 3300 Gross per 24 hour  Intake 1906.75 ml  Output 550 ml  Net 1356.75 ml   Filed Weights   03/30/21 1030  Weight: 59 kg    Data Reviewed: I have personally reviewed and interpreted daily labs, tele strips, imagings as discussed above. I reviewed all nursing notes, pharmacy notes, vitals, pertinent old records I have  discussed plan of care as described above with RN and patient/family.  CBC: Recent Labs  Lab 03/30/21 1135 03/30/21 2127 03/31/21 0349 04/01/21 0719  WBC 6.2 5.8 5.5 5.0  NEUTROABS 4.3  --   --  3.1  HGB 5.6* 8.7* 8.6* 7.1*  HCT 18.5* 26.3* 25.8* 20.9*  MCV 80.1 77.6* 76.8* 76.6*  PLT 121* 112* 115* 762*   Basic Metabolic Panel: Recent Labs  Lab 03/30/21 1135 03/31/21 0349 04/01/21 0719  NA 126* 126* 130*  K 3.9 3.8 4.0  CL 97* 101 106  CO2 17* 21* 19*  GLUCOSE 86 71 92  BUN _1 CREATININE 0.64 0.53* 0.59*  CALCIUM 8.0* 7.5* 6.9*  MG  --   --  1.1*  PHOS  --   --  3.0    Studies: No results found.  Scheduled Meds:  sodium chloride   Intravenous Once   enoxaparin (LOVENOX) injection  40 mg Subcutaneous Q24H   feeding supplement  237 mL Oral TID BM   ferrous sulfate  325 mg Oral Q breakfast   folic acid  1 mg Oral Daily   midodrine  10 mg Oral TID WC   multivitamin with minerals  1 tablet Oral Daily   Vitamin D (Ergocalciferol)  50,000 Units Oral Q7 days   Continuous Infusions:  sodium chloride Stopped (03/30/21 1314)   magnesium sulfate bolus IVPB 2 g (04/01/21 0821)   PRN Meds: acetaminophen **OR** acetaminophen, ketorolac, ondansetron **OR** ondansetron (ZOFRAN) IV  Time spent: 35 minutes  Author: Val Riles. MD [facility] 04/01/2021 12:29 PM  To reach On-call, see care teams to locate the attending and reach out to them via [email or web address]. If 7PM-7AM, please contact night-coverage If you still have difficulty reaching the attending provider, please page the [facility] (Director on Call) for [facility] on amion for assistance.

## 2021-04-01 NOTE — Progress Notes (Signed)
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  INTERVAL HISTORY: Patient continues to have weakness and fatigue, but otherwise feels well.  He has an improved appetite.  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  He does not complain of pain currently.  Patient offers no further complaints.  REVIEW OF SYSTEMS:   Review of Systems  Constitutional:  Positive for malaise/fatigue and weight loss. Negative for fever.  Respiratory: Negative.  Negative for cough, hemoptysis and shortness of breath.   Cardiovascular: Negative.  Negative for chest pain and leg swelling.  Gastrointestinal: Negative.  Negative for abdominal pain.  Genitourinary: Negative.  Negative for dysuria.  Musculoskeletal: Negative.  Negative for back pain.  Skin: Negative.  Negative for rash.  Neurological:  Positive for sensory change, focal weakness and weakness. Negative for dizziness and headaches.  Psychiatric/Behavioral: Negative.  The patient is not nervous/anxious.    As per HPI. Otherwise, a complete review of systems is negative.  PAST MEDICAL HISTORY: Past Medical History:  Diagnosis Date   Adenomatous colon polyp    Anemia    Depression    Elevated LFTs     PAST SURGICAL HISTORY: Past Surgical History:  Procedure Laterality Date   FRACTURE SURGERY     INTRAMEDULLARY (IM) NAIL INTERTROCHANTERIC Right 04/02/2020   Procedure: INTRAMEDULLARY (IM) NAIL INTERTROCHANTRIC;  Surgeon: Corky Mull, MD;  Location: [facility] ORS;  Service: Orthopedics;  Laterality: Right;    FAMILY HISTORY: Family History  Problem Relation Age of Onset   Heart disease Other    Colon polyps Cousin        maternal   Colon polyps Cousin    Colon polyps Maternal Aunt    Colon cancer Maternal Grandfather      ADVANCED DIRECTIVES (Y/N):  _0 @  HEALTH MAINTENANCE: Social History   Tobacco Use   Smoking status: Some Days    Pack years: 0.00   Smokeless tobacco: Never  Substance Use Topics   Alcohol use: Yes    Comment: 4 beers weekly   Drug use: No     Colonoscopy:  PAP:  Bone density:  Lipid panel:  No Known Allergies  Current Facility-Administered Medications  Medication Dose Route Frequency Provider Last Rate Last Admin   0.9 %  sodium chloride infusion  10 mL/hr Intravenous Once Cox, Amy N, DO   Held at 03/30/21 1314   acetaminophen (TYLENOL) tablet 650 mg  650 mg Oral Q6H PRN Cox, Amy N, DO       Or   acetaminophen (TYLENOL) suppository 650 mg  650 mg Rectal Q6H PRN Cox, Amy N, DO       bisacodyl (DULCOLAX) EC tablet 5 mg  5 mg Oral Daily PRN Val Riles, MD       bisacodyl (DULCOLAX) suppository 10 mg  10 mg Rectal Daily PRN Val Riles, MD       Derrill Memo ON 04/02/2021] degarelix [facility]) injection 240 mg  240 mg Subcutaneous Once Lloyd Huger, MD       enoxaparin (LOVENOX) injection 40 mg  40 mg Subcutaneous Q24H Cox, Amy N, DO   40 mg at 03/31/21 2029   ferrous sulfate tablet 325 mg  325 mg Oral Q breakfast Cox, Amy N, DO   325 mg at 29/92/42 6834   folic acid (FOLVITE) tablet  1 mg  1 mg Oral Daily Cox, Amy N, DO   1 mg at 04/01/21 8250   melatonin tablet 5 mg  5 mg Oral QHS Val Riles, MD       midodrine (PROAMATINE) tablet 10 mg  10 mg Oral TID WC Val Riles, MD   10 mg at 04/01/21 1734   multivitamin with minerals tablet 1 tablet  1 tablet Oral Daily Cox, Amy N, DO   1 tablet at 04/01/21 0821   ondansetron (ZOFRAN) tablet 4 mg  4 mg Oral Q6H PRN Cox, Amy N, DO       Or   ondansetron (ZOFRAN) injection 4 mg  4 mg Intravenous Q6H PRN Cox, Amy N, DO       polyethylene glycol (MIRALAX / GLYCOLAX) packet 17 g  17 g Oral Daily Val Riles, MD       traZODone (DESYREL) tablet 50 mg  50 mg Oral QHS PRN Val Riles, MD       Vitamin D (Ergocalciferol)  (DRISDOL) capsule 50,000 Units  50,000 Units Oral Q7 days Val Riles, MD   50,000 Units at 03/31/21 1729    OBJECTIVE: Vitals:   04/01/21 1638 04/01/21 1709  BP: 102/66 103/68  Pulse: 74 73  Resp: 16 16  Temp: 98.3 F (36.8 C) 98.6 F (37 C)  SpO2: 96% 98%     Body mass index is 14.3 kg/m.    ECOG FS:3 - Symptomatic, >50% confined to bed  General: Thin, no acute distress. Eyes: Pink conjunctiva, anicteric sclera. HEENT: Normocephalic, moist mucous membranes. Lungs: No audible wheezing or coughing. Heart: Regular rate and rhythm. Abdomen: Soft, nontender, no obvious distention. Musculoskeletal: No edema, cyanosis, or clubbing. Neuro: Alert, answering all questions appropriately. Cranial nerves grossly intact. Skin: No rashes or petechiae noted. Psych: Normal affect.  LAB RESULTS:  Lab Results  Component Value Date   NA 130 (L) 04/01/2021   K 4.0 04/01/2021   CL 106 04/01/2021   CO2 19 (L) 04/01/2021   GLUCOSE 92 04/01/2021   BUN 6 04/01/2021   CREATININE 0.59 (L) 04/01/2021   CALCIUM 6.9 (L) 04/01/2021   PROT 4.5 (L) 04/01/2021   ALBUMIN 1.9 (L) 04/01/2021   AST 45 (H) 04/01/2021   ALT 6 04/01/2021   ALKPHOS 593 (H) 04/01/2021   BILITOT 0.6 04/01/2021   GFRNONAA >60 04/01/2021   GFRAA >60 04/06/2020    Lab Results  Component Value Date   WBC 5.0 04/01/2021   NEUTROABS 3.1 04/01/2021   HGB 7.1 (L) 04/01/2021   HCT 20.9 (L) 04/01/2021   MCV 76.6 (L) 04/01/2021   PLT 101 (L) 04/01/2021     STUDIES: DG Chest 2 View  Result Date: 03/30/2021 CLINICAL DATA:  Post hip surgery EXAM: CHEST - 2 VIEW COMPARISON:  June 2021 FINDINGS: No consolidation or edema. No pleural effusion. No pneumothorax. Normal heart size. There is a diffusely abnormal appearance to included osseous structures with a mottled appearance. There are multiple thoracic compression fractures. IMPRESSION: Diffusely abnormal appearance of osseous structures concerning for a marrow infiltrative  process. Multiple thoracic compression fractures are likely pathologic. Electronically Signed   By: [name] M.D.   On: 03/30/2021 12:04   CT HEAD WO CONTRAST  Result Date: 04/01/2021 CLINICAL DATA:  Right foot weakness EXAM: CT HEAD WITHOUT CONTRAST TECHNIQUE: Contiguous axial images were obtained from the base of the skull through the vertex without intravenous contrast. COMPARISON:  04/01/2020.  Findings of osseous metastatic disease FINDINGS: Brain: No  evidence of acute infarction, edema hemorrhage, hydrocephalus, extra-axial collection or mass lesion/mass effect. Mild age-related cerebral volume loss. Vascular: No hyperdense vessel or unexpected calcification. Skull: Interval development of diffusely heterogeneous appearance of the bony calvarium and skull base compatible with diffuse osseous metastatic disease. No evidence of pathologic calvarial fracture. Sinuses/Orbits: Small amount of mucosal debris within the bilateral frontal sinuses. Other: None. IMPRESSION: 1. No acute intracranial findings. 2. Interval development of diffusely heterogeneous appearance of the bony calvarium and skull base compatible with diffuse osseous metastatic disease. No evidence of pathologic calvarial fracture. Electronically Signed   By: Davina Poke D.O.   On: 04/01/2021 15:09   CT LUMBAR SPINE WO CONTRAST  Result Date: 04/01/2021 CLINICAL DATA:  Low back pain, trauma; L5 fracture. EXAM: CT LUMBAR SPINE WITHOUT CONTRAST TECHNIQUE: Multidetector CT imaging of the lumbar spine was performed without intravenous contrast administration. Multiplanar CT image reconstructions were also generated. COMPARISON:  CT chest/abdomen/pelvis 03/30/2021. FINDINGS: Segmentation: Transitional lumbosacral anatomy. In correlating with the prior CT chest/abdomen/pelvis of 03/30/2021, the S1 vertebra is transitional and partly lumbarized. Well-formed S1-S2 intervertebral disc. Left-sided S1-S2 assimilation joint. Alignment: Trace  L1-L2 grade 1 retrolisthesis. Trace L3-L4 grade 1 anterolisthesis. Trace L4-L5 grade 1 retrolisthesis. Trace L5-S1 grade 1 retrolisthesis. Vertebrae: Diffuse osseous heterogeneity compatible with diffuse osseous metastatic disease. Mild age-indeterminate L5 inferior endplate compression deformity (for instance as seen on series 8, image 28) (series 6, image 42). There are defects within the L1 superior endplate, L3 superior endplate and L5 superior endplate which appear to reflect Schmorl nodes. Elsewhere, no acute lumbar spine fracture is identified. Paraspinal and other soft tissues: Please refer to the recent prior CT chest/abdomen/pelvis for description of abdominopelvic soft tissue findings. Paraspinal soft tissues within normal limits. Disc levels: No more than mild disc degeneration at any level. L1-L2: Disc bulge. Facet arthrosis. No appreciable significant spinal canal stenosis or neural foraminal narrowing. L2-L3: Disc bulge. Superimposed broad-based left foraminal/extraforaminal disc protrusion. Facet arthrosis. Mild left greater than right subarticular and central canal narrowing. Mild left neural foraminal narrowing. L3-L4: Disc bulge asymmetric to the left. Superimposed broad-based left foraminal/extraforaminal disc protrusion. Facet arthrosis and ligamentum flavum hypertrophy. Suspect severe central canal stenosis with significant bilateral subarticular narrowing. Moderate bilateral neural foraminal narrowing. L4-L5: Disc bulge. Facet arthrosis/ligamentum flavum hypertrophy. Suspect moderate central canal stenosis. Bilateral subarticular narrowing. Mild bilateral neural foraminal narrowing. L5-S1: Disc bulge with endplate spurring. Facet arthrosis and ligamentum flavum hypertrophy. Bilateral subarticular narrowing without appreciable central canal stenosis. Bilateral neural foraminal narrowing (mild right, mild/moderate left). S1-S2: Facet arthrosis. No appreciable significant disc herniation, spinal  canal stenosis or neural foraminal narrowing. 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 Mild age-indeterminate L5 inferior endplate compression deformity, unchanged as compared to the CT abdomen/pelvis of 03/30/2021. Elsewhere, there is no evidence of acute fracture to the lumbar spine. Redemonstrated diffuse osseous heterogeneity compatible with diffuse osseous metastatic disease. Defects within the L1, L3 and L5 superior endplates with an appearance most suggestive of Schmorl nodes. Lumbar spondylosis, as outlined. Severe spinal canal stenosis is suspected at L3-L4. Levels of lesser spinal canal stenosis, as described. Sites of mild and moderate foraminal stenosis, as detailed. Trace multilevel spondylolisthesis. Electronically Signed   By: Kellie Simmering DO   On: 04/01/2021 15:07   CT CHEST ABDOMEN PELVIS W CONTRAST  Result Date: 03/30/2021 CLINICAL DATA:  Unintended weight loss in a 60 year old male, abnormal x-ray. EXAM: CT CHEST, ABDOMEN, AND PELVIS WITH CONTRAST TECHNIQUE: Multidetector CT imaging of the chest,  abdomen and pelvis was performed following the standard protocol during bolus administration of intravenous contrast. CONTRAST:  172m OMNIPAQUE IOHEXOL 300 MG/ML  SOLN COMPARISON:  Previous radiographs of the hip and prior neuro imaging, no cross-sectional imaging of the chest, abdomen or pelvis is available for review. FINDINGS: CT CHEST FINDINGS Cardiovascular: Normal caliber of the thoracic aorta. Normal heart size. Normal caliber central pulmonary vessels. Mediastinum/Nodes: No discrete adenopathy in the mediastinum. Soft tissue about the upper thoracic spine, see below. Esophagus mildly patulous. No axillary lymphadenopathy. No thoracic inlet lymphadenopathy. Lungs/Pleura: No suspicious pulmonary mass. No consolidation or sign of pleural effusion. Airways are patent. Musculoskeletal: Diffuse sclerotic metastatic disease with areas of bony  destruction, sclerosis mixed with lytic changes. Soft tissue about the T3 level with mild loss of height at T3. Thickness of soft tissue approximately 11 mm to the LEFT of the vertebral body measuring 3.3 cm in greatest axial dimension. To a lesser extent there is soft tissue on the contralateral side along the medial RIGHT posterior mediastinum. Multiple rib fractures of varying ages with limited callus formation at most sites but with subacute features and or chronic features throughout the RIGHT chest. Fractures of ribs 3, 4, 5, 6, 7, 8, 9, 10 and 11 posteriorly. Sternum is diffusely involved as are all others skeletal structures within the visualized axial and appendicular skeleton but is intact otherwise. CT ABDOMEN PELVIS FINDINGS Hepatobiliary: No focal, suspicious hepatic lesion. No biliary duct dilation. Pancreas: Normal, without mass, inflammation or ductal dilatation. Spleen: Spleen normal size and contour. Adrenals/Urinary Tract: Adrenal glands are normal. Symmetric renal enhancement. No hydronephrosis. Smooth contour of the urinary bladder. Stomach/Bowel: No acute gastrointestinal findings. Normal appendix gas filled in the RIGHT lower quadrant. Scattered gas-filled loops of small bowel throughout the abdomen. Vascular/Lymphatic: Patent abdominal vessels. No aneurysmal dilation. There is no gastrohepatic or hepatoduodenal ligament lymphadenopathy. No retroperitoneal or mesenteric lymphadenopathy. No pelvic sidewall lymphadenopathy. Reproductive: Prostate is un enlarged not well evaluated. Other: Hazy infiltration of LEFT upper quadrant fat and omentum without discrete measurable abnormality. Small volume ascites. No free air. Musculoskeletal: Diffuse skeletal metastatic disease with pathologic fractures of RIGHT-sided ribs, likely T3 vertebral body, also loss of height in the lower thoracic spine at T8 through T11 and subacute or chronic spinous process fracture at C7. L3 compression of superior  endplate. Callus formation about a RIGHT femoral fracture of the proximal femur post ORIF. IMPRESSION: 1. Diffuse skeletal metastatic disease with [name] of pathologic fracture of multiple RIGHT-sided ribs which appears subacute. 2. Loss of height at multiple levels in the spine likely related to pathologic fracture more likely subacute or chronic. Soft tissue about the upper thoracic spine where there is loss of height of T3 has an appearance more suggestive of metastatic disease than adjacent hematoma. Correlate with any pain in this location. Consider spinal MRI for further assessment as warranted. 3. No discrete site of primary neoplasm is visualized. Prostate is considered based on the appearance of bony sclerosis and diffuse nature of above findings. Correlation with PSA could potentially be helpful as an initial means of further evaluating this patient. 4. Spleen is normal size and there is no sign of adenopathy 5. Subtle infiltration of omental fat may be indicative of early peritoneal disease, attention on follow-up. 6. Small volume ascites. 7. Callus formation about a RIGHT femoral fracture of the proximal femur post ORIF. These results will be called to the ordering clinician or representative by the Radiologist Assistant, and communication documented in the PACS or  [facility] Dashboard. Electronically Signed   By: Zetta Bills M.D.   On: 03/30/2021 16:55   DG Hip Unilat W or Wo Pelvis 2-3 Views Right  Result Date: 03/30/2021 CLINICAL DATA:  Right hip pain.  ORIF. EXAM: DG HIP (WITH OR WITHOUT PELVIS) 2-3V RIGHT COMPARISON:  04/02/2020. FINDINGS: Diffuse severe osteopenia with diffuse mottled lucencies noted throughout the bony structures of the visualized lumbar spine and pelvis. A process such as myeloma or metastatic disease cannot be excluded. What appears to be at L5 compression fracture noted. Lumbar spine series can be obtained for confirmation. No other acute bony abnormality identified.  Postsurgical changes about the right hip. Hardware intact. Anatomic alignment. IMPRESSION: Diffuse severe osteopenia with diffuse mild wall lucencies noted throughout the bony structures of the visualized lumbar spine and pelvis. A process such as myeloma or metastatic disease cannot be excluded. 2. What appears to be a L5 compression fracture noted. Lumbar spine series can be obtained for confirmation. No other acute bony abnormality identified. 3. Postsurgical changes right hip. Hardware intact. 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 CT scan results reviewed independently and reported as above with widespread bony disease, but no obvious visceral lesions.  Patient's PSA is greater than 3000.  Currently, there is no benefit in doing a biopsy.  Molecular studies may be necessary, but these are difficult to accurately obtain off a bone biopsy.  Can consider prostate biopsy in the future.  Bone marrow biopsy is also a consideration to assess for marrow infiltration, but is not necessary at this point.  Patient received Zometa for his bony disease and will receive loading dose 240 mg [employer].  His next dose will not be needed until April 30, 2021.   Severe anemia: Improved with blood transfusion: Patient only has mildly decreased iron stores as well as a decreased folate level.  B12 is within normal limits.  Given the extent of patient's bony disease, he may have marrow infiltration.  Continue to maintain hemoglobin greater than 8.0. Thrombocytopenia: Patient's most recent platelet count was 101.  Monitor. Hypocalcemia: Given patient's decreased albumin, calcium levels corrected to near normal.  Proceed with Zometa as above. Substance abuse: Patient urine drug screen positive for cocaine.  He expressed interest in entering inpatient  rehab. Hypomagnesia: Patient will require IV magnesium replacement.  Will follow.  Lloyd Huger, MD   04/01/2021 7:28 PM

## 2021-04-01 NOTE — Progress Notes (Signed)
Initial Nutrition Assessment  DOCUMENTATION CODES:  Severe malnutrition in context of chronic illness, Underweight  INTERVENTION:  Continue current diet as ordered, encourage PO intake.  Send double protein portions Snacks TID between meals with whole milk to drink Magic cup TID with meals, each supplement provides 290 kcal and 9 grams of protein  NUTRITION DIAGNOSIS:  Severe Malnutrition related to poor appetite as evidenced by severe muscle depletion, severe fat depletion.  GOAL:  Patient will meet greater than or equal to 90% of their needs  MONITOR:  PO intake, Weight trends  REASON FOR ASSESSMENT:  Consult Assessment of nutrition requirement/status  ASSESSMENT:  60 y.o. male presented to ED for intermittent right lateral hip pain that is been ongoing for the past several weeks. Pt had surgery 10 months ago but reports he has not followed-up for pain. Imaging in ED concerning for marrow infiltrative process. PMH relevant for polysubstance abuse.  Oncology reviewed imaging and lab work and determined pt's presentation consistent with wide-spread bony disease concerning for metastatic prostate cancer.  Pt resting in bed at the time of visit. Pt reports significant weight loss in the last month and that appetite is not great. Usually eats 1x/d at home and will eat snacks. Did not elaborate on the types of snacks he liked, but it appears pt is a picky eater. Refused the majority of snack items that could be offered by dining services to augment intake. Pt reported to MD that he drank ensure, but has refused the supplements while admitted. States he does not like the ones the hospital has, buys them from the store. Reports he will not drink what we have. Also refuses all other oral supplements. Is agreeable to magic cup and snacks. States he will also accept plain milk. Will add items to diet order.   Pt is severely depleted in both muscle store. Talked to pt about the amount of  nutrition he needs to take in so that body stores can be replaced and to support his elevated needs. Pt reports he will likely go to rehab after discharge and will gain weight there. Does not seem to have a good grasp on his degree of malnutrition   Nutritionally Relevant Medications: Scheduled Meds:  feeding supplement  237 mL Oral TID BM   ferrous sulfate  325 mg Oral Q breakfast   folic acid  1 mg Oral Daily   multivitamin with minerals  1 tablet Oral Daily   Vitamin D (Ergocalciferol)  50,000 Units Oral Q7 days   Continuous Infusions:  magnesium sulfate bolus IVPB 2 g (04/01/21 0821)   PRN Meds: ondansetron   Labs Reviewed: Na 130 Calcium corrects to 8.58 Mg 1.1 Vitamin D 29.48  NUTRITION - FOCUSED PHYSICAL EXAM: Flowsheet Row Most Recent Value  Orbital Region Severe depletion  Upper Arm Region Severe depletion  Thoracic and Lumbar Region Severe depletion  Buccal Region Severe depletion  Temple Region Severe depletion  Clavicle Bone Region Severe depletion  Clavicle and Acromion Bone Region Severe depletion  Scapular Bone Region Severe depletion  Dorsal Hand Severe depletion  Patellar Region Severe depletion  Anterior Thigh Region Severe depletion  Posterior Calf Region Severe depletion  Edema (RD Assessment) None  Hair Reviewed  Eyes Reviewed  Mouth Reviewed  Skin Reviewed  Nails Reviewed   Diet Order:   Diet Order             Diet regular Room service appropriate? Yes; Fluid consistency: Thin  Diet effective now  EDUCATION NEEDS:  Education needs have been addressed  Skin:  Skin Assessment: Reviewed RN Assessment  Last BM:  6/22  Height:  Ht Readings from Last 1 Encounters:  03/30/21 6\' 5"  (1.956 m)   Weight:  Wt Readings from Last 1 Encounters:  04/01/21 54.7 kg   Ideal Body Weight:  94.5 kg  BMI:  Body mass index is 14.3 kg/m.  Estimated Nutritional Needs:  Kcal:  2400-2600 kcal/d Protein:  130-150 g/d Fluid:   >2.5L/d   Ranell Patrick, RD, LDN Clinical Dietitian Pager on [place]

## 2021-04-02 ENCOUNTER — Inpatient Hospital Stay: Payer: Medicaid Other

## 2021-04-02 LAB — BPAM RBC
Blood Product Expiration Date: 202207162359
Blood Product Expiration Date: 202207202359
Blood Product Expiration Date: 202207202359
ISSUE DATE / TIME: 202206211604
ISSUE DATE / TIME: 202206211935
ISSUE DATE / TIME: 202206231647
Unit Type and Rh: 5100
Unit Type and Rh: 5100
Unit Type and Rh: 5100

## 2021-04-02 LAB — BASIC METABOLIC PANEL
Anion gap: 4 — ABNORMAL LOW (ref 5–15)
BUN: 5 mg/dL — ABNORMAL LOW (ref 6–20)
CO2: 20 mmol/L — ABNORMAL LOW (ref 22–32)
Calcium: 6.4 mg/dL — CL (ref 8.9–10.3)
Chloride: 107 mmol/L (ref 98–111)
Creatinine, Ser: 0.48 mg/dL — ABNORMAL LOW (ref 0.61–1.24)
GFR, Estimated: >60 mL/min (ref >60)
Glucose, Bld: 93 mg/dL (ref 70–99)
Potassium: 3.4 mmol/L — ABNORMAL LOW (ref 3.5–5.1)
Sodium: 131 mmol/L — ABNORMAL LOW (ref 135–145)

## 2021-04-02 LAB — CBC WITH DIFFERENTIAL/PLATELET
Abs Immature Granulocytes: 0.04 10*3/uL (ref 0.00–0.07)
Basophils Absolute: 0 10*3/uL (ref 0.0–0.1)
Basophils Relative: 0 %
Eosinophils Absolute: 0.1 10*3/uL (ref 0.0–0.5)
Eosinophils Relative: 1 %
HCT: 26.5 % — ABNORMAL LOW (ref 39.0–52.0)
Hemoglobin: 9.1 g/dL — ABNORMAL LOW (ref 13.0–17.0)
Immature Granulocytes: 1 %
Lymphocytes Relative: 27 %
Lymphs Abs: 1.4 10*3/uL (ref 0.7–4.0)
MCH: 26.8 pg (ref 26.0–34.0)
MCHC: 34.3 g/dL (ref 30.0–36.0)
MCV: 78.2 fL — ABNORMAL LOW (ref 80.0–100.0)
Monocytes Absolute: 0.4 10*3/uL (ref 0.1–1.0)
Monocytes Relative: 8 %
Neutro Abs: 3.2 10*3/uL (ref 1.7–7.7)
Neutrophils Relative %: 63 %
Platelets: 110 10*3/uL — ABNORMAL LOW (ref 150–400)
RBC: 3.39 MIL/uL — ABNORMAL LOW (ref 4.22–5.81)
RDW: 19.6 % — ABNORMAL HIGH (ref 11.5–15.5)
WBC: 5.1 10*3/uL (ref 4.0–10.5)
nRBC: 0 % (ref 0.0–0.2)

## 2021-04-02 LAB — TYPE AND SCREEN
ABO/RH(D): O POS
Antibody Screen: NEGATIVE
Unit division: 0
Unit division: 0
Unit division: 0

## 2021-04-02 LAB — HEPATIC FUNCTION PANEL
ALT: 6 U/L (ref 0–44)
AST: 30 U/L (ref 15–41)
Albumin: 2 g/dL — ABNORMAL LOW (ref 3.5–5.0)
Alkaline Phosphatase: 566 U/L — ABNORMAL HIGH (ref 38–126)
Bilirubin, Direct: 0.2 mg/dL (ref 0.0–0.2)
Indirect Bilirubin: 0.7 mg/dL (ref 0.3–0.9)
Total Bilirubin: 0.9 mg/dL (ref 0.3–1.2)
Total Protein: 4.9 g/dL — ABNORMAL LOW (ref 6.5–8.1)

## 2021-04-02 LAB — PHOSPHORUS: Phosphorus: 2.3 mg/dL — ABNORMAL LOW (ref 2.5–4.6)

## 2021-04-02 LAB — OCCULT BLOOD X 1 CARD TO LAB, STOOL: Fecal Occult Bld: NEGATIVE

## 2021-04-02 LAB — OSMOLALITY: Osmolality: 270 mOsm/kg — ABNORMAL LOW (ref 275–295)

## 2021-04-02 LAB — MAGNESIUM: Magnesium: 1.6 mg/dL — ABNORMAL LOW (ref 1.7–2.4)

## 2021-04-02 MED ORDER — MAGNESIUM SULFATE 2 GM/50ML IV SOLN
2.0000 g | Freq: Once | INTRAVENOUS | Status: AC
  Administered 2021-04-02: 2 g via INTRAVENOUS
  Filled 2021-04-02: qty 50

## 2021-04-02 MED ORDER — POTASSIUM PHOSPHATES 15 MMOLE/5ML IV SOLN
30.0000 mmol | Freq: Once | INTRAVENOUS | Status: AC
  Administered 2021-04-02: 30 mmol via INTRAVENOUS
  Filled 2021-04-02: qty 10

## 2021-04-02 MED ORDER — PHENOL 1.4 % MT LIQD
1.0000 | OROMUCOSAL | Status: DC | PRN
  Administered 2021-04-02: 1 via OROMUCOSAL
  Filled 2021-04-02: qty 177

## 2021-04-02 MED ORDER — ENSURE ENLIVE PO LIQD
237.0000 mL | Freq: Three times a day (TID) | ORAL | Status: DC
  Administered 2021-04-06 – 2021-04-14 (×16): 237 mL via ORAL
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
## 2021-04-02 NOTE — TOC Progression Note (Signed)
Transition of Care [facility]) - Progression Note    Patient Details  Name: Ryan [name] MRN: [id number] Date of Birth: 03/23/1961  Transition of Care [facility]) CM/SW [place], RN Phone Number: 04/02/2021, 1:58 PM  Clinical Narrative:      Ryan [name] was called from [place] the patient information was given and I asked that she contact the patient to assist with Medicaid and billing resources      Expected Discharge Plan and Services                                                 Social Determinants of Health (SDOH) Interventions    Readmission Risk Interventions No flowsheet data found.

## 2021-04-02 NOTE — Consult Note (Addendum)
Referring Physician:  No referring provider defined for this encounter.  Primary Physician:  Pcp, No  Chief Complaint:  R hip pain  History of Present Illness: 04/02/2021 Ryan [name] is a 60 y.o. male who presents with the chief complaint of R hip pain and trouble walking. He has had worsening issues for a couple of months.  He was not feeling well when he arrived, was noted to be hypotensive and with multiple lab abnormalities.    He had imaging performed prompting concern for metastatic prostate CA.  Review of Systems:  A 10 point review of systems is negative, except for the pertinent positives and negatives detailed in the HPI.  Past Medical History: Past Medical History:  Diagnosis Date   Adenomatous colon polyp    Anemia    Depression    Elevated LFTs     Past Surgical History: Past Surgical History:  Procedure Laterality Date   FRACTURE SURGERY     INTRAMEDULLARY (IM) NAIL INTERTROCHANTERIC Right 04/02/2020   Procedure: INTRAMEDULLARY (IM) NAIL INTERTROCHANTRIC;  Surgeon: Corky Mull, MD;  Location: [facility] ORS;  Service: Orthopedics;  Laterality: Right;    Allergies: Allergies as of 03/30/2021   (No Known Allergies)    Medications:  Current Facility-Administered Medications:    0.9 %  sodium chloride infusion, 10 mL/hr, Intravenous, Once, Cox, Amy N, DO, Held at 03/30/21 1314   acetaminophen (TYLENOL) tablet 650 mg, 650 mg, Oral, Q6H PRN, 650 mg at 04/02/21 0200 **OR** acetaminophen (TYLENOL) suppository 650 mg, 650 mg, Rectal, Q6H PRN, Cox, Amy N, DO   bisacodyl (DULCOLAX) EC tablet 5 mg, 5 mg, Oral, Daily PRN, Val Riles, MD   bisacodyl (DULCOLAX) suppository 10 mg, 10 mg, Rectal, Daily PRN, Val Riles, MD   enoxaparin (LOVENOX) injection 40 mg, 40 mg, Subcutaneous, Q24H, Cox, Amy N, DO, 40 mg at 04/01/21 2042   feeding supplement (ENSURE ENLIVE / ENSURE PLUS) liquid 237 mL, 237 mL, Oral, TID BM, Val Riles, MD   ferrous sulfate tablet 325 mg, 325  mg, Oral, Q breakfast, Cox, Amy N, DO, 325 mg at 66/29/47 6546   folic acid (FOLVITE) tablet 1 mg, 1 mg, Oral, Daily, Cox, Amy N, DO, 1 mg at 04/02/21 0920   melatonin tablet 5 mg, 5 mg, Oral, QHS, Val Riles, MD, 5 mg at 04/01/21 2221   midodrine (PROAMATINE) tablet 10 mg, 10 mg, Oral, TID WC, Val Riles, MD, 10 mg at 04/02/21 0920   multivitamin with minerals tablet 1 tablet, 1 tablet, Oral, Daily, Cox, Amy N, DO, 1 tablet at 04/02/21 0920   ondansetron (ZOFRAN) tablet 4 mg, 4 mg, Oral, Q6H PRN **OR** ondansetron (ZOFRAN) injection 4 mg, 4 mg, Intravenous, Q6H PRN, Cox, Amy N, DO   phenol (CHLORASEPTIC) mouth spray 1 spray, 1 spray, Mouth/Throat, PRN, Val Riles, MD, 1 spray at 04/02/21 0155   polyethylene glycol (MIRALAX / GLYCOLAX) packet 17 g, 17 g, Oral, Daily, Val Riles, MD, 17 g at 04/02/21 1055   potassium PHOSPHATE 30 mmol in dextrose 5 % 500 mL infusion, 30 mmol, Intravenous, Once, Val Riles, MD, Last Rate: 85 mL/hr at 04/02/21 1206, 30 mmol at 04/02/21 1206   traZODone (DESYREL) tablet 50 mg, 50 mg, Oral, QHS PRN, Val Riles, MD, 50 mg at 04/01/21 2221   Vitamin D (Ergocalciferol) (DRISDOL) capsule 50,000 Units, 50,000 Units, Oral, Q7 days, Val Riles, MD, 50,000 Units at 03/31/21 1729   Social History: Social History   Tobacco Use   Smoking status: Some  Days    Pack years: 0.00   Smokeless tobacco: Never  Substance Use Topics   Alcohol use: Yes    Comment: 4 beers weekly   Drug use: No    Family Medical History: Family History  Problem Relation Age of Onset   Heart disease Other    Colon polyps Cousin        maternal   Colon polyps Cousin    Colon polyps Maternal Aunt    Colon cancer Maternal Grandfather     Physical Examination: Vitals:   04/01/21 2017 04/02/21 0430  BP: 117/68 (!) 94/57  Pulse: 69 66  Resp: 18 18  Temp: 99.5 F (37.5 C) 98.5 F (36.9 C)  SpO2: 96% 97%     General: Patient is cachectic, calm, collected, and in no  apparent distress.  Psychiatric: Patient is non-anxious.  Head:  Pupils equal, round, and reactive to light.  ENT:  Oral mucosa appears well hydrated.  Neck:   Supple.  Full range of motion.  Respiratory: Patient is breathing without any difficulty.  Extremities: No edema.  Vascular: Palpable pulses in dorsal pedal vessels.  Skin:   On exposed skin, there are no abnormal skin lesions.  NEUROLOGICAL:  General: In no acute distress.   Awake, alert, oriented to person, place, and time.  Pupils equal round and reactive to light.  Facial tone is symmetric.  Tongue protrusion is midline.  There is no pronator drift.   Strength: Side Biceps Triceps Deltoid Interossei Grip Wrist Ext. Wrist Flex.  R 5 5 5 5 5 5 5   L 5 5 5 5 5 5 5    Side Iliopsoas Quads Hamstring PF DF EHL  R 5 4+ 5 5 5 5   L 5 5 5 5 5 5     Bilateral upper and lower extremity sensation is intact to light touch. Reflexes are 1+ and symmetric at the biceps, triceps, brachioradialis, patella and achilles. Hoffman's is absent.  Gait is untested.  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   Mild age-indeterminate L5 inferior endplate compression deformity, unchanged as compared to the CT abdomen/pelvis of 03/30/2021.   Elsewhere, there is no evidence of acute fracture to the lumbar spine.   Redemonstrated diffuse osseous heterogeneity compatible with diffuse osseous metastatic disease.   Defects within the L1, L3 and L5 superior endplates with an appearance most suggestive of Schmorl nodes.   Lumbar spondylosis, as outlined. Severe spinal canal stenosis is suspected at L3-L4. Levels of lesser spinal canal stenosis, as described. Sites of mild and moderate foraminal stenosis, as detailed.   Trace multilevel spondylolisthesis.     Electronically Signed   By: Kellie Simmering DO   On: 04/01/2021 15:07  I have personally  reviewed the images and agree with the above interpretation.  Labs: CBC Latest Ref Rng & Units 04/02/2021 04/01/2021 03/31/2021  WBC 4.0 - 10.5 K/uL 5.1 5.0 5.5  Hemoglobin 13.0 - 17.0 g/dL 9.1(L) 7.1(L) 8.6(L)  Hematocrit 39.0 - 52.0 % 26.5(L) 20.9(L) 25.8(L)  Platelets 150 - 400 K/uL 110(L) 101(L) 115(L)       Assessment and Plan: Mr. Carver is a pleasant 60 y.o. 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  He may have epidural disease but is not clear.  - MRI total spine - MRI brain to evaluate calvarial disease - Likely not to need surgical intervention as he is anot a great surgical candidate due to the  widespread disease.   Kadelyn Dimascio K. Izora Ribas MD, [place] [facility]

## 2021-04-02 NOTE — Progress Notes (Addendum)
[facility] Progress Note  Patient: Ryan [name]    VVO:[id number]  DOA: 03/30/2021     Date of Service: the patient was seen and examined on 04/02/2021  Chief Complaint  Patient presents with   Pain Management   Brief hospital course: Aarron Wierzbicki is a 60 y.o. male with medical history significant for history of polysubstance abuse including THC, cocaine, alcohol, remote history of tobacco use, history of right proximal femur inter trochanteric fracture status post right IM nail intertrochanteric fixation in 04/02/2021, does not follow-up with a PCP, presents to the emergency department for chief concerns of right hip pain.  Pain is chronic for several months, denies any trauma, difficulty ambulation due to pain. He also endorses an unintentional 35 pound weight loss in the last year, denies bright red blood per rectum and melena. ED work-up, hypotensive, saturating well on room air, Hb 5.6, WBC 6.2, platelet 121, serum sodium 126, potassium 3.9, chloride 97, bicarb 17, BUN 9, serum creatinine of 0.64, nonfasting blood glucose 86.  eGFR is greater than 60. AST 51, ALT 8. Alk phos elevated at 884.  Currently further plan is monitor H&H, PT and OT eval, oncology consult for further recommendation..  Assessment and Plan: Principal Problem:   Symptomatic anemia Active Problems:   Closed right hip fracture (HCC)   Hyponatremia   Closed rib fracture   Acute anemia   Thrombocytopenia (HCC)   Thoracic compression fracture, closed, initial encounter [facility])   Pathologic fracture of thoracic vertebrae, initial encounter   Malnourished ([place])   Protein-calorie malnutrition, severe ([place])   Cancer cachexia ([place])    #Prostate cancer with widespread bony metastasis Prostate biopsy and bone marrow biopsy as an outpatient in future, no need at this time as per oncologist # X-ray read of possible myeloma or metastatic disease # Several thoracic compression fracture, possible pathologic [name]  has been consulted, rec Zometa 4 mg x 1 dose given PSA levels greater than 3000, CT scan showed widespread bony metastasis, suspected due to metastatic prostate cancer.  Patient will need further work-up/biopsy and follow-up with hematology as an outpatient 6/23 s/p Firmagon 240 mg subcu x1 dose given, next dose will be on April 30, 2021   # L5 fracture and L3/4 severe stenosis C/o right lower extremity weakness Neurosurgery consulted, pt may need LSO brace MRI brain and spine pending   # Severe symptomatic hypochromic, normocytic, anemia-suspect secondary to underlying malignancy S/p 2 units of PRBC given on 6/21, Hb 8.6 posttransfusion Iron within normal range, B12 within normal range, Folate deficiency, folic acid level 5.8, sinew oral supplement 6/23 Hb 7.1 transfused 1 unit PRBC FOBT negative x 1     # Hypotension-responsive to fluid, suspect secondary to chronic severe anemia - Low clinical suspicion for sepsis at this time, patient does not have elevated WBC, fever, increased respiration rate Patient was given IV fluid bolus 500 mL x 2 Started NS 70 mill per hour Midodrine 10 mg p.o. 3 times daily with holding parameters Continue to monitor BP     # History of right proximal femur intratrochanteric fracture in June 2021 -Status post IM nail intertrochanteric fixation on 04/02/2021 -He followed up with Dr. Roland Rack, and his staples were removed on 7/84/2021   # Thrombocytopenia-suspect related to metastatic cancer Monitor platelet count daily   # Protein/calorie malnutrition - Ensure 3 times daily resumed - Dietitian has been consulted   # History of polysubstance abuse, abstinence counseling done   # Nursing discovered bedbug infestation  #  Hypocalcemia most likely due to hypoalbuminemia Started Protein supplement  # Mild hypokalemia, potassium repleted. # Hypomagnesemia, magnesium repleted. # Hypophosphatemia, phosphorus repleted. #Hyponatremia, continue monitor  sodium level daily Check serum osmolarity  Pain control: Ketorolac 30 mg IV every 8 hours as needed for moderate pain, 2 days ordered; morphine 2 mg IV every 4 hours as needed for severe pain, 1 day ordered   Body mass index is 15.42 kg/m.  Interventions:   Follow PT and OT eval for disposition plan   Diet: Regular DVT Prophylaxis: Subcutaneous Lovenox   Advance goals of care discussion: Full code  Family Communication: family was not present at bedside, at the time of interview.  The pt provided permission to discuss medical plan with the family. Opportunity was given to ask question and all questions were answered satisfactorily.   Disposition:  Pt is from Home, admitted with right hip pain and symptomatic anemia, still has right hip pain, found to have metastatic cancer, which precludes a safe discharge. Discharge to home, when H&H remained stable and pain is under control PT and OT eval pending for disposition plan  Subjective: No acute overnight issues, pain is under control, patient slept well.  Patient stated that he is able to move his right leg a little bit now, yesterday he could not move.  Patient denied any other neurological changes.  Patient was explained about bony metastasis and L5 fracture and L3-4 stenosis.  Patient is aware that we will get MRI done and neurosurgery will follow.   Physical Exam: General:  alert oriented to time, place, and person.  Appear in mild distress, affect appropriate Eyes: PERRLA ENT: Oral Mucosa Clear, moist  Neck: no JVD,  Cardiovascular: S1 and S2 Present, no Murmur,  Respiratory: good respiratory effort, Bilateral Air entry equal and Decreased, no Crackles, no wheezes Abdomen: Bowel Sound present, Soft and no tenderness,  Skin: no rashes Extremities: no Pedal edema, no calf tenderness Neurologic: right leg weakness power 3-4/5, no any other neurological findings,  Gait not checked due to patient safety concerns  Vitals:    04/01/21 1709 04/01/21 1956 04/01/21 2017 04/02/21 0430  BP: 103/68 116/69 117/68 (!) 94/57  Pulse: 73 71 69 66  Resp: $Remo'16 18 18 18  'pjtsc$ Temp: 98.6 F (37 C) 100.1 F (37.8 C) 99.5 F (37.5 C) 98.5 F (36.9 C)  TempSrc: Oral Oral Oral Oral  SpO2: 98% 95% 96% 97%  Weight:      Height:        Intake/Output Summary (Last 24 hours) at 04/02/2021 1217 Last data filed at 04/02/2021 3614 Gross per 24 hour  Intake 1131.8 ml  Output 1500 ml  Net -368.2 ml   Filed Weights   03/30/21 1030 04/01/21 1232  Weight: 59 kg 54.7 kg    Data Reviewed: I have personally reviewed and interpreted daily labs, tele strips, imagings as discussed above. I reviewed all nursing notes, pharmacy notes, vitals, pertinent old records I have discussed plan of care as described above with RN and patient/family.  CBC: Recent Labs  Lab 03/30/21 1135 03/30/21 2127 03/31/21 0349 04/01/21 0719 04/02/21 0443  WBC 6.2 5.8 5.5 5.0 5.1  NEUTROABS 4.3  --   --  3.1 3.2  HGB 5.6* 8.7* 8.6* 7.1* 9.1*  HCT 18.5* 26.3* 25.8* 20.9* 26.5*  MCV 80.1 77.6* 76.8* 76.6* 78.2*  PLT 121* 112* 115* 101* 431*   Basic Metabolic Panel: Recent Labs  Lab 03/30/21 1135 03/31/21 0349 04/01/21 0719 04/02/21 0443  NA  126* 126* 130* 131*  K 3.9 3.8 4.0 3.4*  CL 97* 101 106 107  CO2 17* 21* 19* 20*  GLUCOSE 86 71 92 93  BUN 9 8 6  5*  CREATININE 0.64 0.53* 0.59* 0.48*  CALCIUM 8.0* 7.5* 6.9* 6.4*  MG  --   --  1.1* 1.6*  PHOS  --   --  3.0 2.3*    Studies: CT HEAD WO CONTRAST  Result Date: 04/01/2021 CLINICAL DATA:  Right foot weakness EXAM: CT HEAD WITHOUT CONTRAST TECHNIQUE: Contiguous axial images were obtained from the base of the skull through the vertex without intravenous contrast. COMPARISON:  04/01/2020.  Findings of osseous metastatic disease FINDINGS: Brain: No evidence of acute infarction, edema hemorrhage, hydrocephalus, extra-axial collection or mass lesion/mass effect. Mild age-related cerebral volume loss.  Vascular: No hyperdense vessel or unexpected calcification. Skull: Interval development of diffusely heterogeneous appearance of the bony calvarium and skull base compatible with diffuse osseous metastatic disease. No evidence of pathologic calvarial fracture. Sinuses/Orbits: Small amount of mucosal debris within the bilateral frontal sinuses. Other: None. IMPRESSION: 1. No acute intracranial findings. 2. Interval development of diffusely heterogeneous appearance of the bony calvarium and skull base compatible with diffuse osseous metastatic disease. No evidence of pathologic calvarial fracture. Electronically Signed   By: 04/03/2020 D.O.   On: 04/01/2021 15:09   CT LUMBAR SPINE WO CONTRAST  Result Date: 04/01/2021 CLINICAL DATA:  Low back pain, trauma; L5 fracture. EXAM: CT LUMBAR SPINE WITHOUT CONTRAST TECHNIQUE: Multidetector CT imaging of the lumbar spine was performed without intravenous contrast administration. Multiplanar CT image reconstructions were also generated. COMPARISON:  CT chest/abdomen/pelvis 03/30/2021. FINDINGS: Segmentation: Transitional lumbosacral anatomy. In correlating with the prior CT chest/abdomen/pelvis of 03/30/2021, the S1 vertebra is transitional and partly lumbarized. Well-formed S1-S2 intervertebral disc. Left-sided S1-S2 assimilation joint. Alignment: Trace L1-L2 grade 1 retrolisthesis. Trace L3-L4 grade 1 anterolisthesis. Trace L4-L5 grade 1 retrolisthesis. Trace L5-S1 grade 1 retrolisthesis. Vertebrae: Diffuse osseous heterogeneity compatible with diffuse osseous metastatic disease. Mild age-indeterminate L5 inferior endplate compression deformity (for instance as seen on series 8, image 28) (series 6, image 42). There are defects within the L1 superior endplate, L3 superior endplate and L5 superior endplate which appear to reflect Schmorl nodes. Elsewhere, no acute lumbar spine fracture is identified. Paraspinal and other soft tissues: Please refer to the recent prior  CT chest/abdomen/pelvis for description of abdominopelvic soft tissue findings. Paraspinal soft tissues within normal limits. Disc levels: No more than mild disc degeneration at any level. L1-L2: Disc bulge. Facet arthrosis. No appreciable significant spinal canal stenosis or neural foraminal narrowing. L2-L3: Disc bulge. Superimposed broad-based left foraminal/extraforaminal disc protrusion. Facet arthrosis. Mild left greater than right subarticular and central canal narrowing. Mild left neural foraminal narrowing. L3-L4: Disc bulge asymmetric to the left. Superimposed broad-based left foraminal/extraforaminal disc protrusion. Facet arthrosis and ligamentum flavum hypertrophy. Suspect severe central canal stenosis with significant bilateral subarticular narrowing. Moderate bilateral neural foraminal narrowing. L4-L5: Disc bulge. Facet arthrosis/ligamentum flavum hypertrophy. Suspect moderate central canal stenosis. Bilateral subarticular narrowing. Mild bilateral neural foraminal narrowing. L5-S1: Disc bulge with endplate spurring. Facet arthrosis and ligamentum flavum hypertrophy. Bilateral subarticular narrowing without appreciable central canal stenosis. Bilateral neural foraminal narrowing (mild right, mild/moderate left). S1-S2: Facet arthrosis. No appreciable significant disc herniation, spinal canal stenosis or neural foraminal narrowing. 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 Mild age-indeterminate L5 inferior endplate compression deformity, unchanged as compared to the CT abdomen/pelvis of 03/30/2021. Elsewhere, there is no evidence  of acute fracture to the lumbar spine. Redemonstrated diffuse osseous heterogeneity compatible with diffuse osseous metastatic disease. Defects within the L1, L3 and L5 superior endplates with an appearance most suggestive of Schmorl nodes. Lumbar spondylosis, as outlined. Severe spinal canal stenosis is suspected at L3-L4.  Levels of lesser spinal canal stenosis, as described. Sites of mild and moderate foraminal stenosis, as detailed. Trace multilevel spondylolisthesis. Electronically Signed   By: Kellie Simmering DO   On: 04/01/2021 15:07    Scheduled Meds:  enoxaparin (LOVENOX) injection  40 mg Subcutaneous Q24H   ferrous sulfate  325 mg Oral Q breakfast   folic acid  1 mg Oral Daily   melatonin  5 mg Oral QHS   midodrine  10 mg Oral TID WC   multivitamin with minerals  1 tablet Oral Daily   polyethylene glycol  17 g Oral Daily   Vitamin D (Ergocalciferol)  50,000 Units Oral Q7 days   Continuous Infusions:  sodium chloride Stopped (03/30/21 1314)   potassium PHOSPHATE IVPB (in mmol) 30 mmol (04/02/21 1206)   PRN Meds: acetaminophen **OR** acetaminophen, bisacodyl, bisacodyl, ondansetron **OR** ondansetron (ZOFRAN) IV, phenol, traZODone  Time spent: 35 minutes  Author: Val Riles. MD [facility] 04/02/2021 12:17 PM  To reach On-call, see care teams to locate the attending and reach out to them via [email or web address]. If 7PM-7AM, please contact night-coverage If you still have difficulty reaching the attending provider, please page the [facility] (Director on Call) for [facility] on amion for assistance.

## 2021-04-02 NOTE — Progress Notes (Signed)
Pt refused to get an MRI. Pt transported back to room at this time.

## 2021-04-03 ENCOUNTER — Inpatient Hospital Stay: Payer: Medicaid Other

## 2021-04-03 LAB — CBC WITH DIFFERENTIAL/PLATELET
Abs Immature Granulocytes: 0.05 10*3/uL (ref 0.00–0.07)
Basophils Absolute: 0 10*3/uL (ref 0.0–0.1)
Basophils Relative: 0 %
Eosinophils Absolute: 0 10*3/uL (ref 0.0–0.5)
Eosinophils Relative: 1 %
HCT: 27.3 % — ABNORMAL LOW (ref 39.0–52.0)
Hemoglobin: 9 g/dL — ABNORMAL LOW (ref 13.0–17.0)
Immature Granulocytes: 1 %
Lymphocytes Relative: 22 %
Lymphs Abs: 1.2 10*3/uL (ref 0.7–4.0)
MCH: 26.4 pg (ref 26.0–34.0)
MCHC: 33 g/dL (ref 30.0–36.0)
MCV: 80.1 fL (ref 80.0–100.0)
Monocytes Absolute: 0.4 10*3/uL (ref 0.1–1.0)
Monocytes Relative: 8 %
Neutro Abs: 3.7 10*3/uL (ref 1.7–7.7)
Neutrophils Relative %: 68 %
Platelets: 115 10*3/uL — ABNORMAL LOW (ref 150–400)
RBC: 3.41 MIL/uL — ABNORMAL LOW (ref 4.22–5.81)
RDW: 20 % — ABNORMAL HIGH (ref 11.5–15.5)
Smear Review: NORMAL
WBC: 5.5 10*3/uL (ref 4.0–10.5)
nRBC: 0 % (ref 0.0–0.2)

## 2021-04-03 LAB — HEPATIC FUNCTION PANEL
ALT: 6 U/L (ref 0–44)
AST: 35 U/L (ref 15–41)
Albumin: 2 g/dL — ABNORMAL LOW (ref 3.5–5.0)
Alkaline Phosphatase: 504 U/L — ABNORMAL HIGH (ref 38–126)
Bilirubin, Direct: 0.1 mg/dL (ref 0.0–0.2)
Indirect Bilirubin: 0.6 mg/dL (ref 0.3–0.9)
Total Bilirubin: 0.7 mg/dL (ref 0.3–1.2)
Total Protein: 4.8 g/dL — ABNORMAL LOW (ref 6.5–8.1)

## 2021-04-03 LAB — BASIC METABOLIC PANEL
Anion gap: 7 (ref 5–15)
BUN: 5 mg/dL — ABNORMAL LOW (ref 6–20)
CO2: 18 mmol/L — ABNORMAL LOW (ref 22–32)
Calcium: 5.4 mg/dL — CL (ref 8.9–10.3)
Chloride: 103 mmol/L (ref 98–111)
Creatinine, Ser: 0.47 mg/dL — ABNORMAL LOW (ref 0.61–1.24)
GFR, Estimated: >60 mL/min (ref >60)
Glucose, Bld: 99 mg/dL (ref 70–99)
Potassium: 4.4 mmol/L (ref 3.5–5.1)
Sodium: 128 mmol/L — ABNORMAL LOW (ref 135–145)

## 2021-04-03 LAB — MAGNESIUM: Magnesium: 1.5 mg/dL — ABNORMAL LOW (ref 1.7–2.4)

## 2021-04-03 LAB — PHOSPHORUS: Phosphorus: 1.9 mg/dL — ABNORMAL LOW (ref 2.5–4.6)

## 2021-04-03 MED ORDER — GADOBUTROL 1 MMOL/ML IV SOLN
5.0000 mL | Freq: Once | INTRAVENOUS | Status: AC | PRN
  Administered 2021-04-03: 7.5 mL via INTRAVENOUS

## 2021-04-03 MED ORDER — MAGNESIUM SULFATE 4 GM/100ML IV SOLN
4.0000 g | Freq: Once | INTRAVENOUS | Status: AC
  Administered 2021-04-03: 4 g via INTRAVENOUS
  Filled 2021-04-03: qty 100

## 2021-04-03 MED ORDER — OXYCODONE HCL 5 MG PO TABS
5.0000 mg | ORAL_TABLET | Freq: Four times a day (QID) | ORAL | Status: DC | PRN
  Administered 2021-04-03 – 2021-04-23 (×33): 5 mg via ORAL
  Filled 2021-04-03 (×34): qty 1

## 2021-04-03 MED ORDER — GUAIFENESIN-DM 100-10 MG/5ML PO SYRP
5.0000 mL | ORAL_SOLUTION | ORAL | Status: DC | PRN
  Administered 2021-04-03 – 2021-04-26 (×47): 5 mL via ORAL
  Filled 2021-04-03 (×47): qty 5

## 2021-04-03 MED ORDER — SODIUM CHLORIDE 0.9 % IV SOLN: INTRAVENOUS | Status: DC

## 2021-04-03 MED ORDER — HYDROXYZINE HCL 25 MG PO TABS
25.0000 mg | ORAL_TABLET | Freq: Three times a day (TID) | ORAL | Status: DC | PRN
  Administered 2021-04-03 – 2021-05-07 (×16): 25 mg via ORAL
  Filled 2021-04-03 (×22): qty 1

## 2021-04-03 MED ORDER — SODIUM PHOSPHATES 45 MMOLE/15ML IV SOLN
30.0000 mmol | Freq: Once | INTRAVENOUS | Status: AC
  Administered 2021-04-03: 30 mmol via INTRAVENOUS
  Filled 2021-04-03: qty 10

## 2021-04-03 NOTE — Progress Notes (Signed)
PT Cancellation Note  Patient Details Name: Ryan [name] MRN: [id number] DOB: 09/12/1961   Cancelled Treatment:    Reason Eval/Treat Not Completed: Medical issues which prohibited therapy (Consult received and chart reviewed. Patient noted to be generally hypotensive and with critically low calcium levels.  Will hold PT eval at this time and initiate at later time/date as medically appropriate and available.)  Alem Fahl H. Owens Shark, PT, DPT, NCS 04/03/21, 10:04 AM [phone]

## 2021-04-03 NOTE — Progress Notes (Signed)
[facility] Progress Note  Patient: Ryan [name]    GNF:[id number]  DOA: 03/30/2021     Date of Service: the patient was seen and examined on 04/03/2021  Chief Complaint  Patient presents with   Pain Management   Brief hospital course: [name] is a 60 y.o. male with medical history significant for history of polysubstance abuse including THC, cocaine, alcohol, remote history of tobacco use, history of right proximal femur inter trochanteric fracture status post right IM nail intertrochanteric fixation in 04/02/2021, does not follow-up with a PCP, presents to the emergency department for chief concerns of right hip pain.  Pain is chronic for several months, denies any trauma, difficulty ambulation due to pain. He also endorses an unintentional 35 pound weight loss in the last year, denies bright red blood per rectum and melena. ED work-up, hypotensive, saturating well on room air, Hb 5.6, WBC 6.2, platelet 121, serum sodium 126, potassium 3.9, chloride 97, bicarb 17, BUN 9, serum creatinine of 0.64, nonfasting blood glucose 86.  eGFR is greater than 60. AST 51, ALT 8. Alk phos elevated at 884.  Currently further plan is monitor H&H, PT and OT eval, oncology consult for further recommendation..  Assessment and Plan: Principal Problem:   Symptomatic anemia Active Problems:   Closed right hip fracture (HCC)   Hyponatremia   Closed rib fracture   Acute anemia   Thrombocytopenia (HCC)   Thoracic compression fracture, closed, initial encounter [facility])   Pathologic fracture of thoracic vertebrae, initial encounter   Malnourished ([place])   Protein-calorie malnutrition, severe ([place])   Cancer cachexia ([place])    #Prostate cancer with widespread bony metastasis Prostate biopsy and bone marrow biopsy as an outpatient in future, no need at this time as per oncologist # X-ray read of possible myeloma or metastatic disease # Several thoracic compression fracture, possible pathologic [name]  has been consulted, rec Zometa 4 mg x 1 dose given PSA levels greater than 3000, CT scan showed widespread bony metastasis, suspected due to metastatic prostate cancer.  Patient will need further work-up/biopsy and follow-up with hematology as an outpatient 6/23 s/p Firmagon 240 mg subcu x1 dose given, next dose will be on April 30, 2021   # L5 fracture and L3/4 severe stenosis C/o right lower extremity weakness Neurosurgery consulted, pt may need LSO brace, likely patient may not need surgical intervention as he is not a great surgical candidate due to widespread disease. F/u MRI brain and spine pending   # Severe symptomatic hypochromic, normocytic, anemia-suspect secondary to underlying malignancy S/p 2 units of PRBC given on 6/21, Hb 8.6 posttransfusion Iron within normal range, B12 within normal range, Folate deficiency, folic acid level 5.8, sinew oral supplement 6/23 Hb 7.1 transfused 1 unit PRBC, posttransfusion Hb 9.1 stable FOBT negative x 1     # Hypotension-responsive to fluid, suspect secondary to chronic severe anemia - Low clinical suspicion for sepsis at this time, patient does not have elevated WBC, fever, increased respiration rate Patient was given IV fluid bolus 500 mL x 2 Started NS 70 ml/hr Midodrine 10 mg p.o. 3 times daily with holding parameters Continue to monitor BP     # History of right proximal femur intratrochanteric fracture in June 2021 -Status post IM nail intertrochanteric fixation on 04/02/2021 -He followed up with Dr. Roland Rack, and his staples were removed on 7/84/2021   # Thrombocytopenia-suspect related to metastatic cancer Monitor platelet count daily   # Protein/calorie malnutrition - Ensure 3 times daily resumed -  Dietitian has been consulted   # History of polysubstance abuse, abstinence counseling done   # Nursing discovered bedbug infestation  # Hypocalcemia most likely due to hypoalbuminemia Started Protein supplement  # Mild  hypokalemia, potassium repleted.  Resolved # Hypomagnesemia, magnesium repleted. # Hypophosphatemia, phosphorus repleted. #Hyponatremia, monitor sodium level daily serum osmolarity 270, started fluid restriction 1.5 L/day   Pain control: Ketorolac 30 mg IV every 8 hours as needed for moderate pain, 2 days ordered; morphine 2 mg IV every 4 hours as needed for severe pain, 1 day ordered   Body mass index is 15.42 kg/m.  Interventions:   Follow PT and OT eval for disposition plan   Diet: Regular DVT Prophylaxis: Subcutaneous Lovenox   Advance goals of care discussion: Full code  Family Communication: family was not present at bedside, at the time of interview.  The pt provided permission to discuss medical plan with the family. Opportunity was given to ask question and all questions were answered satisfactorily.   Disposition:  Pt is from Home, admitted with right hip pain and symptomatic anemia, still has right hip pain, found to have metastatic cancer, which precludes a safe discharge. Discharge to home, when H&H remained stable and pain is under control PT and OT eval pending for disposition plan  Subjective: No acute overnight issues, pain is under control, patient slept well.  Patient is able to move his right leg well, denied any neurological symptoms.  Overall patient is feeling better.    Physical Exam: General:  alert oriented to time, place, and person.  Appear in mild distress, affect appropriate Eyes: PERRLA ENT: Oral Mucosa Clear, moist  Neck: no JVD,  Cardiovascular: S1 and S2 Present, no Murmur,  Respiratory: good respiratory effort, Bilateral Air entry equal and Decreased, no Crackles, no wheezes Abdomen: Bowel Sound present, Soft and no tenderness,  Skin: no rashes Extremities: no Pedal edema, no calf tenderness Neurologic: No focal neurological deficit   Gait not checked due to patient safety concerns  Vitals:   04/02/21 0430 04/02/21 1617 04/03/21 0318  04/03/21 0826  BP: (!) 94/57 111/72 (!) 88/67 (!) 83/54  Pulse: 66 80 85 80  Resp: _0 Temp: 98.5 F (36.9 C) 99.8 F (37.7 C) (!) 97.4 F (36.3 C) 98.3 F (36.8 C)  TempSrc: Oral Oral  Oral  SpO2: 97% 99% 100% 97%  Weight:      Height:        Intake/Output Summary (Last 24 hours) at 04/03/2021 1226 Last data filed at 04/02/2021 2130 Gross per 24 hour  Intake 305 ml  Output 200 ml  Net 105 ml   Filed Weights   03/30/21 1030 04/01/21 1232  Weight: 59 kg 54.7 kg    Data Reviewed: I have personally reviewed and interpreted daily labs, tele strips, imagings as discussed above. I reviewed all nursing notes, pharmacy notes, vitals, pertinent old records I have discussed plan of care as described above with RN and patient/family.  CBC: Recent Labs  Lab 03/30/21 1135 03/30/21 2127 03/31/21 0349 04/01/21 0719 04/02/21 0443 04/03/21 0403  WBC 6.2 5.8 5.5 5.0 5.1 5.5  NEUTROABS 4.3  --   --  3.1 3.2 3.7  HGB 5.6* 8.7* 8.6* 7.1* 9.1* 9.0*  HCT 18.5* 26.3* 25.8* 20.9* 26.5* 27.3*  MCV 80.1 77.6* 76.8* 76.6* 78.2* 80.1  PLT 121* 112* 115* 101* 110* 716*   Basic Metabolic Panel: Recent Labs  Lab 03/30/21 1135 03/31/21 0349 04/01/21 0719 04/02/21 0443  04/03/21 0403  NA 126* 126* 130* 131* 128*  K 3.9 3.8 4.0 3.4* 4.4  CL 97* 101 106 107 103  CO2 17* 21* 19* 20* 18*  GLUCOSE 86 71 92 93 99  BUN _0 5* 5*  CREATININE 0.64 0.53* 0.59* 0.48* 0.47*  CALCIUM 8.0* 7.5* 6.9* 6.4* 5.4*  MG  --   --  1.1* 1.6* 1.5*  PHOS  --   --  3.0 2.3* 1.9*    Studies: No results found.  Scheduled Meds:  enoxaparin (LOVENOX) injection  40 mg Subcutaneous Q24H   feeding supplement  237 mL Oral TID BM   ferrous sulfate  325 mg Oral Q breakfast   folic acid  1 mg Oral Daily   melatonin  5 mg Oral QHS   midodrine  10 mg Oral TID WC   multivitamin with minerals  1 tablet Oral Daily   polyethylene glycol  17 g Oral Daily   Vitamin D (Ergocalciferol)  50,000 Units Oral Q7  days   Continuous Infusions:  magnesium sulfate bolus IVPB 4 g (04/03/21 1032)   sodium phosphate  Dextrose 5% IVPB     PRN Meds: bisacodyl, bisacodyl, hydrOXYzine, ondansetron **OR** ondansetron (ZOFRAN) IV, oxyCODONE, phenol, traZODone  Time spent: 35 minutes  Author: Val Riles. MD [facility] 04/03/2021 12:26 PM  To reach On-call, see care teams to locate the attending and reach out to them via [email or web address]. If 7PM-7AM, please contact night-coverage If you still have difficulty reaching the attending provider, please page the [facility] (Director on Call) for [facility] on amion for assistance.

## 2021-04-04 LAB — BASIC METABOLIC PANEL
Anion gap: 5 (ref 5–15)
BUN: 6 mg/dL (ref 6–20)
CO2: 19 mmol/L — ABNORMAL LOW (ref 22–32)
Calcium: 5.2 mg/dL — CL (ref 8.9–10.3)
Chloride: 102 mmol/L (ref 98–111)
Creatinine, Ser: 0.58 mg/dL — ABNORMAL LOW (ref 0.61–1.24)
GFR, Estimated: >60 mL/min (ref >60)
Glucose, Bld: 93 mg/dL (ref 70–99)
Potassium: 4.2 mmol/L (ref 3.5–5.1)
Sodium: 126 mmol/L — ABNORMAL LOW (ref 135–145)

## 2021-04-04 LAB — CBC WITH DIFFERENTIAL/PLATELET
Abs Immature Granulocytes: 0.06 10*3/uL (ref 0.00–0.07)
Basophils Absolute: 0 10*3/uL (ref 0.0–0.1)
Basophils Relative: 0 %
Eosinophils Absolute: 0.1 10*3/uL (ref 0.0–0.5)
Eosinophils Relative: 1 %
HCT: 27 % — ABNORMAL LOW (ref 39.0–52.0)
Hemoglobin: 9.2 g/dL — ABNORMAL LOW (ref 13.0–17.0)
Immature Granulocytes: 1 %
Lymphocytes Relative: 25 %
Lymphs Abs: 1.4 10*3/uL (ref 0.7–4.0)
MCH: 26.9 pg (ref 26.0–34.0)
MCHC: 34.1 g/dL (ref 30.0–36.0)
MCV: 78.9 fL — ABNORMAL LOW (ref 80.0–100.0)
Monocytes Absolute: 0.4 10*3/uL (ref 0.1–1.0)
Monocytes Relative: 7 %
Neutro Abs: 3.7 10*3/uL (ref 1.7–7.7)
Neutrophils Relative %: 66 %
Platelets: 131 10*3/uL — ABNORMAL LOW (ref 150–400)
RBC: 3.42 MIL/uL — ABNORMAL LOW (ref 4.22–5.81)
RDW: 19.5 % — ABNORMAL HIGH (ref 11.5–15.5)
WBC: 5.6 10*3/uL (ref 4.0–10.5)
nRBC: 0 % (ref 0.0–0.2)

## 2021-04-04 LAB — MULTIPLE MYELOMA PANEL, SERUM
Albumin SerPl Elph-Mcnc: 2.4 g/dL — ABNORMAL LOW (ref 2.9–4.4)
Albumin/Glob SerPl: 0.9 (ref 0.7–1.7)
Alpha 1: 0.3 g/dL (ref 0.0–0.4)
Alpha2 Glob SerPl Elph-Mcnc: 0.8 g/dL (ref 0.4–1.0)
B-Globulin SerPl Elph-Mcnc: 0.6 g/dL — ABNORMAL LOW (ref 0.7–1.3)
Gamma Glob SerPl Elph-Mcnc: 1 g/dL (ref 0.4–1.8)
Globulin, Total: 2.7 g/dL (ref 2.2–3.9)
IgA: 321 mg/dL (ref 90–386)
IgG (Immunoglobin G), Serum: 969 mg/dL (ref 603–1613)
IgM (Immunoglobulin M), Srm: 52 mg/dL (ref 20–172)
Total Protein ELP: 5.1 g/dL — ABNORMAL LOW (ref 6.0–8.5)

## 2021-04-04 LAB — MAGNESIUM: Magnesium: 1.8 mg/dL (ref 1.7–2.4)

## 2021-04-04 LAB — PHOSPHORUS: Phosphorus: 2.5 mg/dL (ref 2.5–4.6)

## 2021-04-04 LAB — ALBUMIN: Albumin: 2 g/dL — ABNORMAL LOW (ref 3.5–5.0)

## 2021-04-04 MED ORDER — CALCIUM GLUCONATE-NACL 1-0.675 GM/50ML-% IV SOLN
1.0000 g | Freq: Once | INTRAVENOUS | Status: AC
  Administered 2021-04-04: 1000 mg via INTRAVENOUS
  Filled 2021-04-04: qty 50

## 2021-04-04 MED ORDER — CALCIUM CARBONATE 1250 (500 CA) MG PO TABS
1.0000 | ORAL_TABLET | Freq: Three times a day (TID) | ORAL | Status: DC
  Administered 2021-04-04 – 2021-04-10 (×17): 500 mg via ORAL
  Filled 2021-04-04 (×20): qty 1

## 2021-04-04 NOTE — Progress Notes (Signed)
PT Cancellation Note  Patient Details Name: Ryan [name] MRN: [id number] DOB: October 16, 1960   Cancelled Treatment:    Reason Eval/Treat Not Completed: Medical issues which prohibited therapy (Chart reviewed for re-attempt at evaluation. Pending arrival of TSLO (to be worn with mobility). Of note, patient continues with hypotension, critically low calcium levels; cleared for participation with session as tolerated provided patient asymptomatic (per Dr. Val Riles).  Will continue to follow and initiate as appropriate. )  Sanjuana Mruk H. Owens Shark, PT, DPT, NCS 04/04/21, 11:26 AM [phone]

## 2021-04-04 NOTE — Progress Notes (Signed)
[facility] called and notified of order for LSO/TSLO brace order.

## 2021-04-04 NOTE — Progress Notes (Signed)
Labs called floor nurse to report critical Calcium 5.2 for Pt.on call MD notified of critical labs at this time.no orders at this time.will cont to monitor patient for any changes in condition.

## 2021-04-04 NOTE — Progress Notes (Signed)
[facility] Progress Note  Patient: Ryan [name]    YDX:[id number]  DOA: 03/30/2021     Date of Service: the patient was seen and examined on 04/04/2021  Chief Complaint  Patient presents with   Pain Management   Brief hospital course: Ryan [name] is a 60 y.o. male with medical history significant for history of polysubstance abuse including THC, cocaine, alcohol, remote history of tobacco use, history of right proximal femur inter trochanteric fracture status post right IM nail intertrochanteric fixation in 04/02/2021, does not follow-up with a PCP, presents to the emergency department for chief concerns of right hip pain.  Pain is chronic for several months, denies any trauma, difficulty ambulation due to pain. He also endorses an unintentional 35 pound weight loss in the last year, denies bright red blood per rectum and melena. ED work-up, hypotensive, saturating well on room air, Hb 5.6, WBC 6.2, platelet 121, serum sodium 126, potassium 3.9, chloride 97, bicarb 17, BUN 9, serum creatinine of 0.64, nonfasting blood glucose 86.  eGFR is greater than 60. AST 51, ALT 8. Alk phos elevated at 884.  Currently further plan is monitor H&H, PT and OT eval, oncology consult for further recommendation..  Assessment and Plan: Principal Problem:   Symptomatic anemia Active Problems:   Closed right hip fracture (HCC)   Hyponatremia   Closed rib fracture   Acute anemia   Thrombocytopenia (HCC)   Thoracic compression fracture, closed, initial encounter [facility])   Pathologic fracture of thoracic vertebrae, initial encounter   Malnourished ([place])   Protein-calorie malnutrition, severe ([place])   Cancer cachexia ([place])    #Prostate cancer with widespread bony metastasis Prostate biopsy and bone marrow biopsy as an outpatient in future, no need at this time as per oncologist # X-ray read of possible myeloma or metastatic disease # Several thoracic compression fracture, possible pathologic [name]  has been consulted, rec Zometa 4 mg x 1 dose given PSA levels greater than 3000, CT scan showed widespread bony metastasis, suspected due to metastatic prostate cancer.  Patient will need further work-up/biopsy and follow-up with hematology as an outpatient 6/23 s/p Firmagon 240 mg subcu x1 dose given, next dose will be on April 30, 2021   # L5 fracture and L3/4 severe stenosis C/o right lower extremity weakness Neurosurgery consulted, pt may need LSO brace, likely patient may not need surgical intervention as he is not a great surgical candidate due to widespread disease. MRI brain and C/T/L spine, reviewed, shows extensive metastasis in the skull base and whole spine.  Questionable acute CVA, repeat MRI brain in 4 weeks.  Pathological fracture of L5.  Tumor extending to paraspinal soft tissues and into the epidural space and thoracic spine, causing some foraminal narrowing.  Please read complete report for details   # Severe symptomatic hypochromic, normocytic, anemia-suspect secondary to underlying malignancy S/p 2 units of PRBC given on 6/21, Hb 8.6 posttransfusion Iron within normal range, B12 within normal range, Folate deficiency, folic acid level 5.8, sinew oral supplement 6/23 Hb 7.1 transfused 1 unit PRBC, posttransfusion Hb 9.1 stable FOBT negative x 1   # Hypotension-responsive to fluid, suspect secondary to chronic severe anemia - Low clinical suspicion for sepsis at this time, patient does not have elevated WBC, fever, increased respiration rate Patient was given IV fluid bolus 500 mL x 2, S/p NS 70 ml/hr Midodrine 10 mg p.o. 3 times daily with holding parameters Still blood pressure is very soft, continue to monitor BP    #  History of right proximal femur intratrochanteric fracture in June 2021 -Status post IM nail intertrochanteric fixation on 04/02/2021 -He followed up with Dr. Roland Rack, and his staples were removed on 7/84/2021   # Thrombocytopenia-suspect related to  metastatic cancer Monitor platelet count daily   # Protein/calorie malnutrition - Ensure 3 times daily resumed - Dietitian has been consulted   # History of polysubstance abuse, abstinence counseling done   # Nursing discovered bedbug infestation, continue contact isolation  # Hypocalcemia most likely due to nutritional deficiency and associated hypoalbuminemia 6/26 Corrected calcium 6.8 very low, patient was given calcium gluconate 1 g IV and started on oral supplement Started Protein supplement Monitor calcium level daily and replete IV as needed  # Mild hypokalemia, potassium repleted.  Resolved # Hypomagnesemia, magnesium repleted. # Hypophosphatemia, phosphorus repleted. #Hyponatremia, monitor sodium level daily serum osmolarity 270, started fluid restriction 1.5 L/day   Pain control: Ketorolac 30 mg IV every 8 hours as needed for moderate pain, 2 days ordered; morphine 2 mg IV every 4 hours as needed for severe pain, 1 day ordered   Body mass index is 15.42 kg/m.  Interventions:   Follow PT and OT eval for disposition plan   Diet: Regular DVT Prophylaxis: Subcutaneous Lovenox   Advance goals of care discussion: Full code  Family Communication: family was not present at bedside, at the time of interview.  The pt provided permission to discuss medical plan with the family. Opportunity was given to ask question and all questions were answered satisfactorily.   Disposition:  Pt is from Home, admitted with right hip pain and symptomatic anemia, found to have metastatic prostate cancer, and persistent low blood pressure, hypercalcemia which precludes a safe discharge. Discharge to home, when blood pressure improves, electrolytes within normal range, H&H remained stable and pain is under control.  PT and OT eval pending for disposition plan  Subjective: No acute overnight issues, pain is under control, patient slept well.  Patient is able to move his right leg well,  denied any neurological symptoms.  Overall patient is feeling better.    Physical Exam: General:  alert oriented to time, place, and person.  Appear in mild distress, affect appropriate Eyes: PERRLA ENT: Oral Mucosa Clear, moist  Neck: no JVD,  Cardiovascular: S1 and S2 Present, no Murmur,  Respiratory: good respiratory effort, Bilateral Air entry equal and Decreased, no Crackles, no wheezes Abdomen: Bowel Sound present, Soft and no tenderness,  Skin: no rashes Extremities: no Pedal edema, no calf tenderness Neurologic: No focal neurological deficit   Gait not checked due to patient safety concerns  Vitals:   04/03/21 1946 04/03/21 2324 04/04/21 0427 04/04/21 0745  BP: (!) 90/57 92/69 (!) 84/55 (!) 81/54  Pulse: 72 79 82 79  Resp: _0 Temp: 98.9 F (37.2 C) 97.7 F (36.5 C) 99.1 F (37.3 C) 98.3 F (36.8 C)  TempSrc: Oral Oral Oral Oral  SpO2: 100% 99% 96% 97%  Weight:      Height:        Intake/Output Summary (Last 24 hours) at 04/04/2021 1133 Last data filed at 04/04/2021 1021 Gross per 24 hour  Intake 267.63 ml  Output 700 ml  Net -432.37 ml   Filed Weights   03/30/21 1030 04/01/21 1232  Weight: 59 kg 54.7 kg    Data Reviewed: I have personally reviewed and interpreted daily labs, tele strips, imagings as discussed above. I reviewed all nursing notes, pharmacy notes, vitals, pertinent old records I  have discussed plan of care as described above with RN and patient/family.  CBC: Recent Labs  Lab 03/30/21 1135 03/30/21 2127 03/31/21 0349 04/01/21 0719 04/02/21 0443 04/03/21 0403 04/04/21 0505  WBC 6.2   < > 5.5 5.0 5.1 5.5 5.6  NEUTROABS 4.3  --   --  3.1 3.2 3.7 3.7  HGB 5.6*   < > 8.6* 7.1* 9.1* 9.0* 9.2*  HCT 18.5*   < > 25.8* 20.9* 26.5* 27.3* 27.0*  MCV 80.1   < > 76.8* 76.6* 78.2* 80.1 78.9*  PLT 121*   < > 115* 101* 110* 115* 131*   < > = values in this interval not displayed.   Basic Metabolic Panel: Recent Labs  Lab  03/31/21 0349 04/01/21 0719 04/02/21 0443 04/03/21 0403 04/04/21 0505  NA 126* 130* 131* 128* 126*  K 3.8 4.0 3.4* 4.4 4.2  CL 101 106 107 103 102  CO2 21* 19* 20* 18* 19*  GLUCOSE 71 92 93 99 93  BUN 8 6 5* 5* 6  CREATININE 0.53* 0.59* 0.48* 0.47* 0.58*  CALCIUM 7.5* 6.9* 6.4* 5.4* 5.2*  MG  --  1.1* 1.6* 1.5* 1.8  PHOS  --  3.0 2.3* 1.9* 2.5    Studies: MR BRAIN W WO CONTRAST  Result Date: 04/03/2021 CLINICAL DATA:  Metastatic disease evaluation. EXAM: MRI HEAD WITHOUT AND WITH CONTRAST TECHNIQUE: Multiplanar, multiecho pulse sequences of the brain and surrounding structures were obtained without and with intravenous contrast. CONTRAST:  7.39m GADAVIST GADOBUTROL 1 MMOL/ML IV SOLN COMPARISON:  CT head April 01, 2021. FINDINGS: Brain: Punctate nonenhancing focus of restricted diffusion in the high right juxtacortical frontal lobe (series 2, image 38). Additional punctate focus of DWI hyperintensity in the region left trigeminal nerve (series 2, image 16). No hydrocephalus. No abnormal mass effect. No extra-axial fluid collection. No acute hemorrhage. Generalized atrophy with ex vacuo ventricular dilation. Vascular: Major arterial flow voids are maintained at the skull base. Skull and upper cervical spine: Diffuse abnormal calvarial and skull base signal with patchy enhancement, compatible with diffuse osseous metastatic disease. Sinuses/Orbits: Atelectatic right maxillary sinus with mild right maxillary mucosal thickening. Mild right frontoethmoidal mucosal thickening. Other: Large left and moderate right mastoid effusions. IMPRESSION: 1. Punctate nonenhancing focus of restricted diffusion in the high right juxtacortical frontal lobe. This most likely represents a small acute infarct; however, early nonenhancing metastatic lesion is not excluded and follow-up MRI with contrast in approximately 4 weeks is recommended to ensure resolution. 2. Additional punctate focus of DWI hyperintensity in the  region of the cisternal left trigeminal nerve without enhancement. While potentially artifactual, this finding also warrants attention on the recommended short interval follow-up MRI. 3. 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 4. Large left and moderate right mastoid effusions. 5. Mild right ostiomeatal unit pattern paranasal sinus mucosal thickening. Electronically Signed   By: FMargaretha SheffieldMD   On: 04/03/2021 15:55   MR TOTAL SPINE METS SCREENING  Result Date: 04/03/2021 CLINICAL DATA:  Metastatic disease evaluation. EXAM: MRI TOTAL SPINE WITHOUT AND WITH CONTRAST TECHNIQUE: Multisequence MR imaging of the spine from the cervical spine to the sacrum was performed prior to and following IV contrast administration for evaluation of spinal metastatic disease. CONTRAST:  7.591mGADAVIST GADOBUTROL 1 MMOL/ML IV SOLN COMPARISON:  CT chest/abdomen/pelvis March 30, 2021. CT lumbar spine April 01, 2021. FINDINGS: MRI CERVICAL SPINE FINDINGS Alignment: No substantial sagittal subluxation. Vertebrae: Diffuse heterogeneity of the bone marrow with heterogeneous/patchy enhancement, compatible  with diffuse metastatic disease. No linear marrow edema or vertebral body height loss to suggest acute fracture. Cord: Motion limited evaluation without convincing cord signal abnormality. Postcontrast imaging is limited by motion without obvious evidence of epidural extension of tumor. Posterior Fossa, vertebral arteries, paraspinal tissues: The visualized vertebral artery flow voids are maintained. Posterior fossa is better assessed on same day MRI head. Disc levels: Severe multilevel degenerative disc disease in the cervical spine. There is mild to moderate canal stenosis at C3-C4 and severe canal stenosis at C4-C5 and C5-C6. Likely severe bilateral foraminal stenosis at C3-C4, C4-C5, C5-C6, and C6-C7. MRI THORACIC SPINE FINDINGS Alignment:  Normal. Vertebrae: Diffuse heterogeneity of the bone marrow  with heterogeneous/patchy enhancement, compatible with diffuse metastatic disease. Mild height loss of the T3 and T8 through T11 vertebral bodies, suggestive pathologic fractures that may be subacute or chronic given no convincing marrow edema. Cord:  No evidence of abnormal cord signal. Paraspinal and other soft tissues: Enhancing tumor extends into the ventral and left lateral paraspinal soft tissues from T2 to T4-T5 and into the epidural space at T2-T3 to involve the left subarticular recess and left T2-T3 foramen (see series 38, image 4). Resulting left T2-T3 foraminal narrowing without significant canal stenosis. Motion limited evaluation without any other sites of paraspinal/epidural tumor extension.Please see recent CT chest abdomen and pelvis for extra-spinal evaluation. Disc levels: Posterior disc bulges on the left at T9-T10, T10-T11 which partially efface ventral CSF with mild canal stenosis at these levels. MRI LUMBAR SPINE FINDINGS Segmentation:  Transitional lumbosacral anatomy with lumbarized S1. Alignment:  No substantial sagittal subluxation. Vertebrae: Deformity of the inferior L5 endplate, similar to prior CT chest abdomen/pelvis. Otherwise, lumbar vertebral body heights are maintained with scattered degenerative Schmorl's nodes. Conus medullaris: Extends to the L1-L2 level and appears normal. No evidence of abnormal enhancement of the conus or cauda equina nerve roots. No evidence of epidural tumor involvement. Paraspinal and other soft tissues: No paraspinal extension of tumor identified. Please see recent CT chest abdomen and pelvis for extra-spinal evaluation. Disc levels: Posterior disc bulges at L3-L4 and L4-L5 with mild canal and bilateral subarticular recess stenosis at these levels. Multilevel mild foraminal stenosis. IMPRESSION: 1. Extensive diffuse osseous metastatic disease throughout the cervical, thoracic and lumbar spine. 2. Enhancing tumor extends into the ventral and left lateral  paraspinal soft tissues from T2 to T4-T5 and into the epidural space at T2-T3 to involve the left subarticular recess and left T2-T3 foramen (see series 38, image 4). Resulting left T2-T3 foraminal narrowing without significant canal stenosis. Motion limited evaluation without any other sites of paraspinal/epidural tumor identified. 3. Mild height loss of the T3 and T8 through T11 vertebral bodies and deformity of the inferior L5 endplate, similar to prior CT chest/abdomen/pelvis and suggestive of pathologic fractures that may be subacute or chronic given no convincing marrow edema. 4. Severe multilevel degenerative change of the cervical spine. Severe canal stenosis C4-C5 and C5-C6. Likely severe bilateral foraminal stenosis at C3-C4, C4-C5, C5-C6, and C6-C7. Dedicated MRI of the cervical spine could further characterize if clinically indicated. 5. Small disc bulges at T9-T10, T10-T11, L3-L4, and L4-L5 with mild canal stenosis at these levels. 6. Please see recent CT chest/abdomen/pelvis for extra-spinal valuation. Electronically Signed   By: Margaretha Sheffield MD   On: 04/03/2021 16:35    Scheduled Meds:  calcium carbonate  1 tablet Oral TID WC   enoxaparin (LOVENOX) injection  40 mg Subcutaneous Q24H   feeding supplement  237 mL Oral TID  BM   ferrous sulfate  325 mg Oral Q breakfast   folic acid  1 mg Oral Daily   melatonin  5 mg Oral QHS   midodrine  10 mg Oral TID WC   multivitamin with minerals  1 tablet Oral Daily   polyethylene glycol  17 g Oral Daily   Vitamin D (Ergocalciferol)  50,000 Units Oral Q7 days   Continuous Infusions:  sodium chloride 75 mL/hr at 04/03/21 1315   PRN Meds: bisacodyl, bisacodyl, guaiFENesin-dextromethorphan, hydrOXYzine, ondansetron **OR** ondansetron (ZOFRAN) IV, oxyCODONE, phenol, traZODone  Time spent: 35 minutes  Author: Val Riles. MD [facility] 04/04/2021 11:33 AM  To reach On-call, see care teams to locate the attending and reach out to them  via [email or web address]. If 7PM-7AM, please contact night-coverage If you still have difficulty reaching the attending provider, please page the [facility] (Director on Call) for [facility] on amion for assistance.

## 2021-04-04 NOTE — Plan of Care (Signed)
  Problem: Education: Goal: Knowledge of General Education information will improve Description: Including pain rating scale, medication(s)/side effects and non-pharmacologic comfort measures Outcome: Progressing   Problem: Health Behavior/Discharge Planning: Goal: Ability to manage health-related needs will improve Outcome: Progressing   Problem: Clinical Measurements: Goal: Respiratory complications will improve Outcome: Progressing Goal: Cardiovascular complication will be avoided Outcome: Progressing   Problem: Activity: Goal: Risk for activity intolerance will decrease Outcome: Progressing   Problem: Nutrition: Goal: Adequate nutrition will be maintained Outcome: Progressing   Problem: Coping: Goal: Level of anxiety will decrease Outcome: Progressing   Problem: Elimination: Goal: Will not experience complications related to bowel motility Outcome: Progressing Goal: Will not experience complications related to urinary retention Outcome: Progressing   Problem: Pain Managment: Goal: General experience of comfort will improve Outcome: Progressing   Problem: Safety: Goal: Ability to remain free from injury will improve Outcome: Progressing   Problem: Skin Integrity: Goal: Risk for impaired skin integrity will decrease Outcome: Progressing   

## 2021-04-05 DIAGNOSIS — S2249XA Multiple fractures of ribs, unspecified side, initial encounter for closed fracture: Secondary | ICD-10-CM

## 2021-04-05 DIAGNOSIS — Z515 Encounter for palliative care: Secondary | ICD-10-CM

## 2021-04-05 DIAGNOSIS — R64 Cachexia: Secondary | ICD-10-CM

## 2021-04-05 DIAGNOSIS — Z7189 Other specified counseling: Secondary | ICD-10-CM

## 2021-04-05 DIAGNOSIS — E871 Hypo-osmolality and hyponatremia: Secondary | ICD-10-CM

## 2021-04-05 DIAGNOSIS — S22000A Wedge compression fracture of unspecified thoracic vertebra, initial encounter for closed fracture: Secondary | ICD-10-CM

## 2021-04-05 LAB — CBC WITH DIFFERENTIAL/PLATELET
Abs Immature Granulocytes: 0.05 10*3/uL (ref 0.00–0.07)
Basophils Absolute: 0 10*3/uL (ref 0.0–0.1)
Basophils Relative: 0 %
Eosinophils Absolute: 0.1 10*3/uL (ref 0.0–0.5)
Eosinophils Relative: 2 %
HCT: 27.7 % — ABNORMAL LOW (ref 39.0–52.0)
Hemoglobin: 9.4 g/dL — ABNORMAL LOW (ref 13.0–17.0)
Immature Granulocytes: 1 %
Lymphocytes Relative: 23 %
Lymphs Abs: 1.1 10*3/uL (ref 0.7–4.0)
MCH: 26.7 pg (ref 26.0–34.0)
MCHC: 33.9 g/dL (ref 30.0–36.0)
MCV: 78.7 fL — ABNORMAL LOW (ref 80.0–100.0)
Monocytes Absolute: 0.4 10*3/uL (ref 0.1–1.0)
Monocytes Relative: 8 %
Neutro Abs: 3.1 10*3/uL (ref 1.7–7.7)
Neutrophils Relative %: 66 %
Platelets: 153 10*3/uL (ref 150–400)
RBC: 3.52 MIL/uL — ABNORMAL LOW (ref 4.22–5.81)
RDW: 19.3 % — ABNORMAL HIGH (ref 11.5–15.5)
WBC: 4.7 10*3/uL (ref 4.0–10.5)
nRBC: 0 % (ref 0.0–0.2)

## 2021-04-05 LAB — BASIC METABOLIC PANEL
Anion gap: 5 (ref 5–15)
BUN: 5 mg/dL — ABNORMAL LOW (ref 6–20)
CO2: 19 mmol/L — ABNORMAL LOW (ref 22–32)
Calcium: 6.1 mg/dL — CL (ref 8.9–10.3)
Chloride: 104 mmol/L (ref 98–111)
Creatinine, Ser: 0.41 mg/dL — ABNORMAL LOW (ref 0.61–1.24)
GFR, Estimated: >60 mL/min (ref >60)
Glucose, Bld: 84 mg/dL (ref 70–99)
Potassium: 4.4 mmol/L (ref 3.5–5.1)
Sodium: 128 mmol/L — ABNORMAL LOW (ref 135–145)

## 2021-04-05 LAB — PHOSPHORUS: Phosphorus: 2.4 mg/dL — ABNORMAL LOW (ref 2.5–4.6)

## 2021-04-05 LAB — MAGNESIUM: Magnesium: 1.5 mg/dL — ABNORMAL LOW (ref 1.7–2.4)

## 2021-04-05 MED ORDER — MAGNESIUM SULFATE 2 GM/50ML IV SOLN
2.0000 g | Freq: Once | INTRAVENOUS | Status: AC
  Administered 2021-04-05: 2 g via INTRAVENOUS
  Filled 2021-04-05: qty 50

## 2021-04-05 MED ORDER — SODIUM CHLORIDE 1 G PO TABS
1.0000 g | ORAL_TABLET | Freq: Two times a day (BID) | ORAL | Status: DC
  Administered 2021-04-05 – 2021-04-26 (×28): 1 g via ORAL
  Filled 2021-04-05 (×33): qty 1

## 2021-04-05 NOTE — Evaluation (Signed)
Physical Therapy Evaluation Patient Details Name: Ryan [name] MRN: [id number] DOB: 1961/02/15 Today's Date: 04/05/2021   History of Present Illness  Patient is a 60 year old male with prostate cancer with widespread bony metastasis, L5 fracture and L3/4 severe stenosis with LE weakness, MRI brain and C/T/L spine shows extensive metastasis in the skull base and whole spine, questionable acute CVA among other things,  Hypocalcemia most likely due to nutritional deficiency and associated hypoalbuminemia, hyoptension. TLSO recommended from nurosurgery and devliered to room.   Clinical Impression  Patient agreeable to work with PT with maximal encouragement. He complains mostly of abdominal tenderness from medication injections. No back pain is reported today. Patient states he lives alone and was previously walking with a rolling walker.  Patient refusing to donn TLSO brace despite encouragement and education on the purpose of the brace. He agreed to sit up on edge of bed and required +2 person assistance for logroll technique. Patient able to perform lateral scooting in sitting position x 4 bouts to the left with supervision and cues for technique. Limited overall activity tolerance, generalized weakness, and endurance impaired for sustained activity. Blood pressure monitored during session, 71/48 in supine and 87/62 in sitting. Heart rate 74bpm and Sp02 100% at rest on room air. Mild dizziness initially with sitting upright that subsided.  Patient is currently unsafe to manage at home alone. SNF is recommended for short term rehab. Recommend continued PT to maximize independence and address functional limitations listed below.    Follow Up Recommendations SNF    Equipment Recommendations  None recommended by PT    Recommendations for Other Services       Precautions / Restrictions Precautions Precautions: Fall Required Braces or Orthoses: Spinal Brace Spinal Brace: Thoracolumbosacral  orthotic Restrictions Weight Bearing Restrictions: No      Mobility  Bed Mobility Overal bed mobility: Needs Assistance Bed Mobility: Rolling;Sidelying to Sit;Sit to Sidelying Rolling: Min guard Sidelying to sit: Mod assist;+2 for physical assistance     Sit to sidelying: Mod assist;+2 for physical assistance General bed mobility comments: verbal cues for logroll technique to maintain general spine precuations with bed mobility. increased time required and encouragement needed for participation    Transfers Overall transfer level: Needs assistance   Transfers: Lateral/Scoot Transfers          Lateral/Scoot Transfers: Supervision General transfer comment: patient able to scoot along edge of bed x 4 bouts with supervision and verbal cues for technique. patient refused to put TLSO on due abdominal tenderness around injection sites despite maximal encouragement to wear the brace. patient declined getting out of bed at this time. patient does report mild dizziness in sitting that subsides with increased time. blood pressure in sitting 87/62 which was an improvement from laying in bed at 71/48.  Ambulation/Gait                Stairs            Wheelchair Mobility    Modified Rankin (Stroke Patients Only)       Balance Overall balance assessment: Needs assistance Sitting-balance support: Feet supported;Bilateral upper extremity supported Sitting balance-Leahy Scale: Fair                                       Pertinent Vitals/Pain Pain Assessment: Faces Faces Pain Scale: Hurts little more Pain Location: abdomen Pain Descriptors / Indicators: Sore Pain Intervention(s): Monitored  during session;Repositioned    Home Living Family/patient expects to be discharged to:: Private residence Living Arrangements: Alone Available Help at Discharge: Available PRN/intermittently Type of Home: Apartment Home Access: Level entry     Home Layout: One  level        Prior Function Level of Independence: Independent with assistive device(s)   Gait / Transfers Assistance Needed: patient reports he is Mod I for ambulation with rolling walker           Hand Dominance        Extremity/Trunk Assessment   Upper Extremity Assessment Upper Extremity Assessment: Generalized weakness    Lower Extremity Assessment Lower Extremity Assessment: Generalized weakness (patient able to activate hip/knee/ankle movement. slowed movement in RLE compared to left. unable to formally MMT due to limited activity tolerance)       Communication      Cognition Arousal/Alertness: Awake/alert Behavior During Therapy: WFL for tasks assessed/performed Overall Cognitive Status: Within Functional Limits for tasks assessed                                 General Comments: patient able to follow all commands without difficulty      General Comments      Exercises     Assessment/Plan    PT Assessment Patient needs continued PT services  PT Problem List Decreased strength;Decreased activity tolerance;Decreased range of motion;Decreased balance;Decreased mobility;Decreased safety awareness;Decreased knowledge of use of DME;Pain       PT Treatment Interventions DME instruction;Gait training;Stair training;Therapeutic activities;Functional mobility training;Therapeutic exercise;Balance training;Neuromuscular re-education;Patient/family education    PT Goals (Current goals can be found in the Care Plan section)  Acute Rehab PT Goals Patient Stated Goal: to get to a rehab facility for 3 months PT Goal Formulation: With patient Time For Goal Achievement: 04/19/21 Potential to Achieve Goals: Fair    Frequency Min 2X/week   Barriers to discharge Decreased caregiver support      Co-evaluation PT/OT/SLP Co-Evaluation/Treatment: Yes Reason for Co-Treatment: Complexity of the patient's impairments (multi-system involvement);To address  functional/ADL transfers PT goals addressed during session: Mobility/safety with mobility         AM-PAC PT "6 Clicks" Mobility  Outcome Measure Help needed turning from your back to your side while in a flat bed without using bedrails?: A Little Help needed moving from lying on your back to sitting on the side of a flat bed without using bedrails?: A Little Help needed moving to and from a bed to a chair (including a wheelchair)?: Total Help needed standing up from a chair using your arms (e.g., wheelchair or bedside chair)?: Total Help needed to walk in hospital room?: Total Help needed climbing 3-5 steps with a railing? : Total 6 Click Score: 10    End of Session   Activity Tolerance: Patient limited by pain;Patient limited by fatigue Patient left: in bed;with call bell/phone within reach;with bed alarm set Nurse Communication: Mobility status (nurse in room to assess abdomen in sitting position. mild bulging/redness noted in lower right quadrant) PT Visit Diagnosis: Unsteadiness on feet (R26.81);Muscle weakness (generalized) (M62.81);Difficulty in walking, not elsewhere classified (R26.2)    Time: 4081-4481 PT Time Calculation (min) (ACUTE ONLY): 26 min   Charges:   PT Evaluation $PT Eval Moderate Complexity: 1 Mod PT Treatments $Therapeutic Activity: 8-22 mins        Minna Merritts, PT, MPT   [name] 04/05/2021, 10:01 AM

## 2021-04-05 NOTE — TOC Progression Note (Addendum)
Transition of Care [facility]) - Progression Note    Patient Details  Name: Wanda Cellucci MRN: [id number] Date of Birth: Mar 25, 1961  [facility] CM/SW [place], RN Phone Number: 04/05/2021, 2:38 PM  Clinical Narrative:    Met with the patient and discussed the DC plan and needs He stated that he has applied for Disability about 3 months ago, he is not sure if it was approved or not, he stated that he applied for Medicaid and he was told by Lynnell Chad At [facility] that it was approved however he does not have a number, he is going to call Miss Leroy Sea and try to get the medicaid number from her, I contacted Trecia Rogers with [employer] and asked for her assistance to obtain this information, left her a VM requesting a call back, The patient is agreeable to go to [facility],  He is undergoing current Chemo treatments. His next treatment is 04/30/21 PASSR obtained, Fl2 completed, Bedsearch sent          Expected Discharge Plan and Services                                                 Social Determinants of Health (SDOH) Interventions    Readmission Risk Interventions No flowsheet data found.

## 2021-04-05 NOTE — Progress Notes (Signed)
PROGRESS NOTE    Ryan [name]  WUX:[id number] DOB: 1960-10-15 DOA: 03/30/2021 PCP: Merryl Hacker, No    Brief Narrative:  Ryan [name] is a 60 y.o. male with medical history significant for history of polysubstance abuse including THC, cocaine, alcohol, remote history of tobacco use, history of right proximal femur inter trochanteric fracture status post right IM nail intertrochanteric fixation in 04/02/2021, does not follow-up with a PCP, presents to the emergency department for chief concerns of right hip pain.  Pain is chronic for several months, denies any trauma, difficulty ambulation due to pain. He also endorses an unintentional 35 pound weight loss in the last year, denies bright red blood per rectum and melena. ED work-up, hypotensive, saturating well on room air, Hb 5.6, WBC 6.2, platelet 121, serum sodium 126, potassium 3.9, chloride 97, bicarb 17, BUN 9, serum creatinine of 0.64, nonfasting blood glucose 86.  eGFR is greater than 60. AST 51, ALT 8. Alk phos elevated at 884. Upon arriving the hospital, patient was found to have L5 compression fracture, L3/4 severe stenosis.  She was also found to have widespread bony metastasis  from prostate cancer.  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  Prostate cancer with widespread bony metastasis. L5 compression fracture. 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 Patient has been evaluated by oncology, he is given Zometa for pathological fracture.  He is also receiving [place]. He is also evaluated by neurosurgery for spinal  stenosis, not a candidate for surgery.  LSO brace is applied Patient is currently pending for nursing home placement.  2.  Severe symptomatic anemia. Folic acid deficiency. Thrombocytopenia. Received PRBC transfusion.  Hemoglobin now stable.  #3.  Hypotension. Received fluid bolus. Discontinue IV fluids, added midodrine.  #4.  Severe protein calorie malnutrition. Continue supplements  5.  Hypokalemia. Hypomagnesemia Hypophosphatemia. Hypocalcemia. Hyponatremia. Hypocalcemia was thought to be secondary to severe hypoalbuminemia.  Patient does not have true hypocalcemia.  Currently taking calcium supplements.  Will discontinue IV fluids as patient has no longer any dehydration, recheck BMP tomorrow.  Add a sodium tablet. Give additional 2 g magnesium sulfate.     DVT prophylaxis: Lovenox Code Status: Full Family Communication:  Disposition Plan:    Status is: Inpatient  Remains inpatient appropriate because:Unsafe d/c plan  Dispo: The patient is from: Home              Anticipated d/c is to: SNF              Patient currently is medically stable to d/c.   Difficult to place patient No        I/O last 3 completed shifts: In: 120 [P.O.:120] Out: 2300 [Urine:2300] Total I/O In: 78 [P.O.:60; I.V.:800] Out: -      Consultants:  Oncology, neurosurgery  Procedures: None  Antimicrobials: None  Subjective: Patient has significant weakness, otherwise he is doing well.  He does not have significant back pain.  He denies any short of breath or cough. No abdominal pain or nausea vomiting. Denies any fever chills  No chest pain or palpitation. No dysuria hematuria.  Objective: Vitals:   04/04/21 1642 04/04/21 1955 04/05/21 0513 04/05/21 0809  BP: (!) 89/59 101/64 98/63 (!) 86/55  Pulse: 72 71 72 70  Resp: _0 Temp: 98.9 F (37.2 C) 98.4 F (36.9 C) 98.4 F (36.9 C) 97.7 F (36.5 C)  TempSrc: Oral Oral  Oral  SpO2: 97% 100% 99% 97%  Weight:       Height:        Intake/Output Summary (Last 24 hours) at 04/05/2021 1025 Last data filed at 04/05/2021 1005 Gross per 24 hour  Intake 860 ml  Output 1600 ml  Net -740 ml   Filed Weights   03/30/21 1030 04/01/21 1232  Weight: 59 kg 54.7 kg    Examination:  General exam: Appears calm and comfortable, severely malnourished, Respiratory system: Clear to auscultation. Respiratory effort normal. Cardiovascular system: S1 & S2 heard, RRR. No JVD, murmurs, rubs, gallops or clicks. No pedal edema. Gastrointestinal system: Abdomen is nondistended, soft and nontender. No organomegaly or masses felt. Normal bowel sounds heard. Central nervous system: Alert and oriented. No focal neurological deficits. Extremities: Significant muscle atrophy Skin: No rashes, lesions or ulcers Psychiatry: Judgement and insight appear normal. Mood & affect appropriate.     Data Reviewed: I have personally reviewed following labs and imaging studies  CBC: Recent Labs  Lab 04/01/21 0719 04/02/21 0443 04/03/21 0403 04/04/21 0505 04/05/21 0647  WBC 5.0 5.1 5.5 5.6 4.7  NEUTROABS 3.1 3.2 3.7 3.7 3.1  HGB 7.1* 9.1* 9.0* 9.2* 9.4*  HCT 20.9* 26.5* 27.3* 27.0* 27.7*  MCV 76.6* 78.2* 80.1 78.9* 78.7*  PLT 101* 110* 115* 131* 016   Basic Metabolic Panel: Recent Labs  Lab 04/01/21 0719 04/02/21 0443 04/03/21 0403 04/04/21 0505 04/05/21 0647  NA 130* 131* 128* 126* 128*  K 4.0 3.4* 4.4 4.2 4.4  CL 106 107 103 102 104  CO2 19* 20* 18* 19* 19*  GLUCOSE 92 93 99 93 84  BUN 6 5* 5* 6 5*  CREATININE 0.59* 0.48* 0.47* 0.58* 0.41*  CALCIUM 6.9* 6.4* 5.4* 5.2* 6.1*  MG 1.1* 1.6* 1.5* 1.8 1.5*  PHOS 3.0 2.3* 1.9* 2.5 2.4*   GFR: Estimated Creatinine Clearance: 76.9 mL/min (A) (by C-G formula based on SCr of 0.41 mg/dL (L)). Liver Function Tests: Recent Labs  Lab 03/30/21 1135 04/01/21 0719 04/02/21 0443 04/03/21 0403 04/04/21 0505  AST 51* 45* 30 35  --   ALT _1 --   ALKPHOS 884* 593*  566* 504*  --   BILITOT 0.6 0.6 0.9 0.7  --   PROT 6.2* 4.5* 4.9* 4.8*  --   ALBUMIN 2.9* 1.9* 2.0* 2.0* 2.0*   No results for input(s): LIPASE, AMYLASE in the last 168 hours. No results for input(s): AMMONIA in the last 168 hours. Coagulation Profile: No results for input(s): INR, PROTIME in the last 168 hours. Cardiac Enzymes: Recent Labs  Lab 03/30/21 1135  CKTOTAL 141   BNP (last 3 results) No results for input(s): PROBNP in the last 8760 hours. HbA1C: No results for input(s): HGBA1C in the last 72 hours. CBG: Recent Labs  Lab 04/01/21 1745  GLUCAP 108*   Lipid Profile: No results for input(s): CHOL, HDL, LDLCALC, TRIG, CHOLHDL, LDLDIRECT in the last 72 hours. Thyroid Function Tests: No results for input(s): TSH, T4TOTAL, FREET4, T3FREE, THYROIDAB in the last 72 hours. Anemia Panel: No results for input(s): VITAMINB12, FOLATE, FERRITIN, TIBC, IRON, RETICCTPCT in the last 72 hours. Sepsis Labs: Recent  Labs  Lab 03/30/21 1420  LATICACIDVEN 2.6*    Recent Results (from the past 240 hour(s))  Resp Panel by RT-PCR (Flu A&B, Covid) Nasopharyngeal Swab     Status: None   Collection Time: 03/30/21  1:37 PM   Specimen: Nasopharyngeal Swab; Nasopharyngeal(NP) swabs in vial transport medium  Result Value Ref Range Status   SARS Coronavirus 2 by RT PCR NEGATIVE NEGATIVE Final    Comment: (NOTE) SARS-CoV-2 target nucleic acids are NOT DETECTED.  The SARS-CoV-2 RNA is generally detectable in upper respiratory specimens during the acute phase of infection. The lowest concentration of SARS-CoV-2 viral copies this assay can detect is 138 copies/mL. A negative result does not preclude SARS-Cov-2 infection and should not be used as the sole basis for treatment or other patient management decisions. A negative result may occur with  improper specimen collection/handling, submission of specimen other than nasopharyngeal swab, presence of viral mutation(s) within the areas  targeted by this assay, and inadequate number of viral copies(<138 copies/mL). A negative result must be combined with clinical observations, patient history, and epidemiological information. The expected result is Negative.  Fact Sheet for Patients:  EntrepreneurPulse.com.au  Fact Sheet for Healthcare Providers:  IncredibleEmployment.be  This test is no t yet approved or cleared by the [place] FDA and  has been authorized for detection and/or diagnosis of SARS-CoV-2 by FDA under an Emergency Use Authorization (EUA). This EUA will remain  in effect (meaning this test can be used) for the duration of the COVID-19 declaration under Section 564(b)(1) of the Act, 21 U.S.C.section 360bbb-3(b)(1), unless the authorization is terminated  or revoked sooner.       Influenza A by PCR NEGATIVE NEGATIVE Final   Influenza B by PCR NEGATIVE NEGATIVE Final    Comment: (NOTE) The Xpert Xpress SARS-CoV-2/FLU/RSV plus assay is intended as an aid in the diagnosis of influenza from Nasopharyngeal swab specimens and should not be used as a sole basis for treatment. Nasal washings and aspirates are unacceptable for Xpert Xpress SARS-CoV-2/FLU/RSV testing.  Fact Sheet for Patients: EntrepreneurPulse.com.au  Fact Sheet for Healthcare Providers: IncredibleEmployment.be  This test is not yet approved or cleared by the [place] FDA and has been authorized for detection and/or diagnosis of SARS-CoV-2 by FDA under an Emergency Use Authorization (EUA). This EUA will remain in effect (meaning this test can be used) for the duration of the COVID-19 declaration under Section 564(b)(1) of the Act, 21 U.S.C. section 360bbb-3(b)(1), unless the authorization is terminated or revoked.  Performed at [facility], [address], [place], [place] [address]          Radiology Studies: MR BRAIN W WO  CONTRAST  Result Date: 04/03/2021 CLINICAL DATA:  Metastatic disease evaluation. EXAM: MRI HEAD WITHOUT AND WITH CONTRAST TECHNIQUE: Multiplanar, multiecho pulse sequences of the brain and surrounding structures were obtained without and with intravenous contrast. CONTRAST:  7.27m GADAVIST GADOBUTROL 1 MMOL/ML IV SOLN COMPARISON:  CT head April 01, 2021. FINDINGS: Brain: Punctate nonenhancing focus of restricted diffusion in the high right juxtacortical frontal lobe (series 2, image 38). Additional punctate focus of DWI hyperintensity in the region left trigeminal nerve (series 2, image 16). No hydrocephalus. No abnormal mass effect. No extra-axial fluid collection. No acute hemorrhage. Generalized atrophy with ex vacuo ventricular dilation. Vascular: Major arterial flow voids are maintained at the skull base. Skull and upper cervical spine: Diffuse abnormal calvarial and skull base signal with patchy enhancement, compatible with diffuse osseous metastatic disease. Sinuses/Orbits: Atelectatic right  maxillary sinus with mild right maxillary mucosal thickening. Mild right frontoethmoidal mucosal thickening. Other: Large left and moderate right mastoid effusions. IMPRESSION: 1. Punctate nonenhancing focus of restricted diffusion in the high right juxtacortical frontal lobe. This most likely represents a small acute infarct; however, early nonenhancing metastatic lesion is not excluded and follow-up MRI with contrast in approximately 4 weeks is recommended to ensure resolution. 2. Additional punctate focus of DWI hyperintensity in the region of the cisternal left trigeminal nerve without enhancement. While potentially artifactual, this finding also warrants attention on the recommended short interval follow-up MRI. 3. 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 4. Large left and moderate right mastoid effusions. 5. Mild right ostiomeatal unit pattern paranasal sinus mucosal thickening.  Electronically Signed   By: Margaretha Sheffield MD   On: 04/03/2021 15:55   MR TOTAL SPINE METS SCREENING  Result Date: 04/03/2021 CLINICAL DATA:  Metastatic disease evaluation. EXAM: MRI TOTAL SPINE WITHOUT AND WITH CONTRAST TECHNIQUE: Multisequence MR imaging of the spine from the cervical spine to the sacrum was performed prior to and following IV contrast administration for evaluation of spinal metastatic disease. CONTRAST:  7.7m GADAVIST GADOBUTROL 1 MMOL/ML IV SOLN COMPARISON:  CT chest/abdomen/pelvis March 30, 2021. CT lumbar spine April 01, 2021. FINDINGS: MRI CERVICAL SPINE FINDINGS Alignment: No substantial sagittal subluxation. Vertebrae: Diffuse heterogeneity of the bone marrow with heterogeneous/patchy enhancement, compatible with diffuse metastatic disease. No linear marrow edema or vertebral body height loss to suggest acute fracture. Cord: Motion limited evaluation without convincing cord signal abnormality. Postcontrast imaging is limited by motion without obvious evidence of epidural extension of tumor. Posterior Fossa, vertebral arteries, paraspinal tissues: The visualized vertebral artery flow voids are maintained. Posterior fossa is better assessed on same day MRI head. Disc levels: Severe multilevel degenerative disc disease in the cervical spine. There is mild to moderate canal stenosis at C3-C4 and severe canal stenosis at C4-C5 and C5-C6. Likely severe bilateral foraminal stenosis at C3-C4, C4-C5, C5-C6, and C6-C7. MRI THORACIC SPINE FINDINGS Alignment:  Normal. Vertebrae: Diffuse heterogeneity of the bone marrow with heterogeneous/patchy enhancement, compatible with diffuse metastatic disease. Mild height loss of the T3 and T8 through T11 vertebral bodies, suggestive pathologic fractures that may be subacute or chronic given no convincing marrow edema. Cord:  No evidence of abnormal cord signal. Paraspinal and other soft tissues: Enhancing tumor extends into the ventral and left lateral  paraspinal soft tissues from T2 to T4-T5 and into the epidural space at T2-T3 to involve the left subarticular recess and left T2-T3 foramen (see series 38, image 4). Resulting left T2-T3 foraminal narrowing without significant canal stenosis. Motion limited evaluation without any other sites of paraspinal/epidural tumor extension.Please see recent CT chest abdomen and pelvis for extra-spinal evaluation. Disc levels: Posterior disc bulges on the left at T9-T10, T10-T11 which partially efface ventral CSF with mild canal stenosis at these levels. MRI LUMBAR SPINE FINDINGS Segmentation:  Transitional lumbosacral anatomy with lumbarized S1. Alignment:  No substantial sagittal subluxation. Vertebrae: Deformity of the inferior L5 endplate, similar to prior CT chest abdomen/pelvis. Otherwise, lumbar vertebral body heights are maintained with scattered degenerative Schmorl's nodes. Conus medullaris: Extends to the L1-L2 level and appears normal. No evidence of abnormal enhancement of the conus or cauda equina nerve roots. No evidence of epidural tumor involvement. Paraspinal and other soft tissues: No paraspinal extension of tumor identified. Please see recent CT chest abdomen and pelvis for extra-spinal evaluation. Disc levels: Posterior disc bulges at L3-L4 and L4-L5 with  mild canal and bilateral subarticular recess stenosis at these levels. Multilevel mild foraminal stenosis. IMPRESSION: 1. Extensive diffuse osseous metastatic disease throughout the cervical, thoracic and lumbar spine. 2. Enhancing tumor extends into the ventral and left lateral paraspinal soft tissues from T2 to T4-T5 and into the epidural space at T2-T3 to involve the left subarticular recess and left T2-T3 foramen (see series 38, image 4). Resulting left T2-T3 foraminal narrowing without significant canal stenosis. Motion limited evaluation without any other sites of paraspinal/epidural tumor identified. 3. Mild height loss of the T3 and T8 through  T11 vertebral bodies and deformity of the inferior L5 endplate, similar to prior CT chest/abdomen/pelvis and suggestive of pathologic fractures that may be subacute or chronic given no convincing marrow edema. 4. Severe multilevel degenerative change of the cervical spine. Severe canal stenosis C4-C5 and C5-C6. Likely severe bilateral foraminal stenosis at C3-C4, C4-C5, C5-C6, and C6-C7. Dedicated MRI of the cervical spine could further characterize if clinically indicated. 5. Small disc bulges at T9-T10, T10-T11, L3-L4, and L4-L5 with mild canal stenosis at these levels. 6. Please see recent CT chest/abdomen/pelvis for extra-spinal valuation. Electronically Signed   By: Margaretha Sheffield MD   On: 04/03/2021 16:35        Scheduled Meds:  calcium carbonate  1 tablet Oral TID WC   enoxaparin (LOVENOX) injection  40 mg Subcutaneous Q24H   feeding supplement  237 mL Oral TID BM   ferrous sulfate  325 mg Oral Q breakfast   folic acid  1 mg Oral Daily   melatonin  5 mg Oral QHS   midodrine  10 mg Oral TID WC   multivitamin with minerals  1 tablet Oral Daily   polyethylene glycol  17 g Oral Daily   Vitamin D (Ergocalciferol)  50,000 Units Oral Q7 days   Continuous Infusions:  magnesium sulfate bolus IVPB       LOS: 6 days    Time spent: 34 minutes    Sharen Hones, MD [facility]   To contact the attending provider between 7A-7P or the covering provider during after hours 7P-7A, please log into the web site [email or web address] and access using universal [place] password for that web site. If you do not have the password, please call the hospital operator.  04/05/2021, 10:25 AM

## 2021-04-05 NOTE — Consult Note (Signed)
Consultation Note Date: 04/05/2021   Patient Name: Ryan [name]  DOB: 1961-01-15  MRN: [id number]  Age / Sex: 60 y.o., male   PCP: Pcp, No Referring Physician: Sharen Hones, MD   REASON FOR CONSULTATION:Establishing goals of care  Palliative Care consult requested for goals of care discussion in this 60 y.o. male with a medical history significant for polysubstance abuse including THC, cocaine, alcohol, remote history of tobacco use, history of right proximal femur inter trochanteric fracture status post right IM nail intertrochanteric fixation (04/02/2020) and rib fractures s/p mechanical fall and lying on the floor x2 days. He presented to the ED from home with complaints of right hip pain. Per notations patient reports pain has been ongoing for several months. During work-up hemoglobin 5.6. Alk phos 884. He received 2 units Packed Red blood cells. CT abdomen pelvis showed diffuse skeletal metastatic disease, pathological right-sided rib fractures. Oncology following and treating in the setting of PSA greater than 3000. 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 Patient started on [place] and [place].   Clinical Assessment and Goals of Care: I have reviewed medical records including lab results, imaging, Epic notes, and MAR, received report from the bedside RN, and assessed the patient.   I met at the bedside with Ryan [name] to discuss diagnosis prognosis, [place], EOL wishes, disposition and options.  He is sitting up in bed. Awake, alert and oriented. Denies pain or shortness of breath at this time. States his appetite is improving. His lunch tray is present. He ordered 2 cheeseburgers.   I introduced Palliative Medicine as specialized medical care for people living with serious illness. It focuses on providing relief from the symptoms and stress of a serious illness. The goal is to improve quality of life for both the patient and the family.  We discussed a brief  life review of the patient, along with his functional and nutritional status. Ryan [name] shares 3 sons (59, 88, and 16). He has 2 grandchildren. He lives alone in the home. He worked different jobs over his life.   Prior to admission he states his appetite has drastically decreased. He endorses an approximate weight loss of 30-40 lbs over the past 6 months. He states he has began having more difficulty walking requiring use of a walker due to leg weakness, fatigue, and foot pain. He does not follow up with a PCP regularly.    We discussed His current illness and what it means in the larger context of His on-going co-morbidities. Natural disease trajectory and expectations at EOL were discussed.  Aahil is tearful discussing his new metastatic prostate cancer. He shares his cousint recently passed away from metastatic prostate cancer. He shares once his cousin sought medical treatment it was to late and he was not able to receive any treatments. Unfortunately he passed away several weeks after his diagnosis. Chord shares his cousin expressing his biggest regret was ignoring his symptoms and not seeking care. Emotional support provided. He shares concerns that he was almost on the same path as his cousin. He is grateful for treatment options but knows his health is still at a critical state.   He states his aunt and DSS social worker is the one who encouraged him to seek medical care.  A detailed discussion was had today regarding advanced directives.  Concepts specific to code status, artifical feeding and hydration, continued IV antibiotics and rehospitalization. Dempsy states he does not have an advanced directive however he would  like his aunt Kirby Funk to be names his medical decision maker. She has been a mother figure to him most of life and moreso after the death of his mother.   We discussed at length his current full code status with consideration of his current illness and co-morbidities. He  states he would like to remain a full code at this time but would not want prolong intubation, no artificial feedings, and no tracheostomy. If he required heroic measures and had no chance of survival he would wish for a natural death and if he goes on life support would not want to remain on there more than 3-4 days unless chances for a meaningful recovery.   Values and goals of care important to patient and family were attempted to be elicited.    I discussed the importance of continued conversation with medical providers regarding overall plan of care and treatment options, ensuring decisions are within the context of the his values and GOCs.  Mr. Rieman is clear in expressed wishes to continue to treat the treatable. He is open to SNF rehab if recommended. He is remaining hopeful for some stability, knowing he will have a new baseline in life. He is willing to make lifestyle adjustments to allow him every opportunity to thrive.   Palliative Care services outpatient were explained and offered. He verbalized understanding and awareness of palliative's goals and philosophy of care.   Questions and concerns were addressed.  Patient was encouraged to call with questions or concerns.  PMT will continue to support holistically as needed.   CODE STATUS: Full code  ADVANCE DIRECTIVES: Primary Decision Maker: Patient/Ryan (Aunt)    SYMPTOM MANAGEMENT: per attending   Palliative Prophylaxis:  Bowel Regimen and Frequent Pain Assessment  PSYCHO-SOCIAL/SPIRITUAL: Support System: Family Desire for further Chaplaincy support:yes  Additional Recommendations (Limitations, Scope, Preferences): No Artificial Feeding, No Tracheostomy, and continue to treat the treatable, no long-term life prolonging measures/intubation Education on hospice/palliative    PAST MEDICAL HISTORY: Past Medical History:  Diagnosis Date   Adenomatous colon polyp    Anemia    Depression    Elevated LFTs      ALLERGIES:  has No Known Allergies.   MEDICATIONS:  Current Facility-Administered Medications  Medication Dose Route Frequency Provider Last Rate Last Admin   bisacodyl (DULCOLAX) EC tablet 5 mg  5 mg Oral Daily PRN Val Riles, MD       bisacodyl (DULCOLAX) suppository 10 mg  10 mg Rectal Daily PRN Val Riles, MD       calcium carbonate (OS-CAL - dosed in mg of elemental calcium) tablet 500 mg of elemental calcium  1 tablet Oral TID WC Mansy, Jan A, MD   500 mg of elemental calcium at 04/05/21 1202   enoxaparin (LOVENOX) injection 40 mg  40 mg Subcutaneous Q24H Cox, Amy N, DO   40 mg at 04/02/21 2137   feeding supplement (ENSURE ENLIVE / ENSURE PLUS) liquid 237 mL  237 mL Oral TID BM Val Riles, MD       ferrous sulfate tablet 325 mg  325 mg Oral Q breakfast Cox, Amy N, DO   325 mg at 97/35/32 9924   folic acid (FOLVITE) tablet 1 mg  1 mg Oral Daily Cox, Amy N, DO   1 mg at 04/05/21 0840   guaiFENesin-dextromethorphan (ROBITUSSIN DM) 100-10 MG/5ML syrup 5 mL  5 mL Oral Q4H PRN Val Riles, MD   5 mL at 04/05/21 1205   hydrOXYzine (ATARAX/VISTARIL) tablet 25  mg  25 mg Oral TID PRN Val Riles, MD   25 mg at 04/03/21 1323   melatonin tablet 5 mg  5 mg Oral QHS Val Riles, MD   5 mg at 04/04/21 2100   midodrine (PROAMATINE) tablet 10 mg  10 mg Oral TID WC Val Riles, MD   10 mg at 04/05/21 1200   multivitamin with minerals tablet 1 tablet  1 tablet Oral Daily Cox, Amy N, DO   1 tablet at 04/05/21 0840   ondansetron (ZOFRAN) tablet 4 mg  4 mg Oral Q6H PRN Cox, Amy N, DO       Or   ondansetron (ZOFRAN) injection 4 mg  4 mg Intravenous Q6H PRN Cox, Amy N, DO       oxyCODONE (Oxy IR/ROXICODONE) immediate release tablet 5 mg  5 mg Oral Q6H PRN Val Riles, MD   5 mg at 04/05/21 0520   phenol (CHLORASEPTIC) mouth spray 1 spray  1 spray Mouth/Throat PRN Val Riles, MD   1 spray at 04/02/21 0155   polyethylene glycol (MIRALAX / GLYCOLAX) packet 17 g  17 g Oral Daily Val Riles, MD   17 g at 04/02/21 1055   sodium chloride tablet 1 g  1 g Oral BID WC Sharen Hones, MD       traZODone (DESYREL) tablet 50 mg  50 mg Oral QHS PRN Val Riles, MD   50 mg at 04/02/21 2147   Vitamin D (Ergocalciferol) (DRISDOL) capsule 50,000 Units  50,000 Units Oral Q7 days Val Riles, MD   50,000 Units at 03/31/21 1729    VITAL SIGNS: BP 95/62 (BP Location: Right Arm)   Pulse 63   Temp 98.4 F (36.9 C) (Oral)   Resp 16   Ht $R'6\' 5"'en$  (1.956 m)   Wt 54.7 kg Comment: bed weight  SpO2 100%   BMI 14.30 kg/m  Filed Weights   03/30/21 1030 04/01/21 1232  Weight: 59 kg 54.7 kg    Estimated body mass index is 14.3 kg/m as calculated from the following:   Height as of this encounter: $RemoveBeforeD'6\' 5"'jqTqaDGDskNezI$  (1.956 m).   Weight as of this encounter: 54.7 kg.  LABS: CBC:    Component Value Date/Time   WBC 4.7 04/05/2021 0647   HGB 9.4 (L) 04/05/2021 0647   HCT 27.7 (L) 04/05/2021 0647   PLT 153 04/05/2021 0647   Comprehensive Metabolic Panel:    Component Value Date/Time   NA 128 (L) 04/05/2021 0647   K 4.4 04/05/2021 0647   BUN 5 (L) 04/05/2021 0647   CREATININE 0.41 (L) 04/05/2021 0647   ALBUMIN 2.0 (L) 04/04/2021 0505    Review of Systems  Musculoskeletal:  Positive for arthralgias.  Neurological:  Positive for weakness.  Unless otherwise noted, a complete review of systems is negative.  Physical Exam General: NAD, cachectic, ill appearing Cardiovascular: regular rate and rhythm Pulmonary: diminished bilaterally  Abdomen: soft, nontender, + bowel sounds Extremities: no edema, no joint deformities Skin: no rashes, warm and dry, muscle wasting  Neurological: AAOx3, mood appropriate    Prognosis: Guarded  Discharge Planning:  To Be Determined with ongoing palliative support  Recommendations: Full Code-as confirmed by patient No prolonged life support, if no meaningful recovery would then wish for a natural death.  Continue with current plan of care, patient's  expressed wishes to continue to treat the treatable. He is remaining hopeful of stability with treatment. Understands severity of his current illness. Open to rehab if recommended.  States his aunt, Ryan [name] is desired Scientist, research (medical). Encouraged patient to consider completing HCPOA documents.  Recommend outpatient Palliative support. If going to have regular Oncology visits would be appropriate to connect with my colleague Billey Chang, NP for outpatient follow-up.  PMT will continue to support and follow as needed. Please call team line with urgent needs.   Palliative Performance Scale:                Patient expressed understanding and was in agreement with this plan.   Thank you for allowing the Palliative Medicine Team to assist in the care of this patient. Please utilize secure chat with additional questions, if there is no response within 30 minutes please call the above phone number.   Time In:  1330 Time Out: 1425 Time Total: 55 min.   Visit consisted of counseling and education dealing with the complex and emotionally intense issues of symptom management and palliative care in the setting of serious and potentially life-threatening illness.Greater than 50%  of this time was spent counseling and coordinating care related to the above assessment and plan.  Signed by:  Alda Lea, AGPCNP-BC Palliative Medicine Team  Phone: [phone] Pager: [phone] Amion: [place] Team providers are available by phone from 7am to 7pm daily and can be reached through the team cell phone.  Should this patient require assistance outside of these hours, please call the patient's attending physician.

## 2021-04-05 NOTE — Progress Notes (Addendum)
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 TLSO recommended from nurosurgery and devliered to room.   OT comments  Ryan [name] was seen for OT/PT co-treatment on this date. Pt requires MAX A don B socks at bed level. SETUP self-feeding bed level. Pt tolerated ~10 mins sitting EOB c MIN cues for encouragement and positioning, pt refused donning TLSO brace citing abdominal pain. Pt taught log rolling with fair carryover. Pt making good progress toward goals. Pt continues to benefit from skilled OT services to maximize return to PLOF and minimize risk of future falls, injury, caregiver burden, and readmission. Will continue to follow POC. Discharge recommendation remains appropriate. Frequency downgraded 2/2 pt continuing to be limited by pain.    Follow Up Recommendations  SNF    Equipment Recommendations  Other (comment) (TBD at next venue of care)    Recommendations for Other Services      Precautions / Restrictions Precautions Precautions: Fall Required Braces or Orthoses: Spinal Brace Spinal Brace: Thoracolumbosacral orthotic Restrictions Weight Bearing Restrictions: No       Mobility Bed Mobility Overal bed mobility: Needs Assistance Bed Mobility: Rolling;Sidelying to Sit;Sit to Sidelying Rolling: Min guard Sidelying to sit: Mod assist;+2 for physical assistance     Sit to sidelying: Mod assist;+2 for physical assistance General bed mobility comments: verbal cues for logroll technique to maintain general  spine precuations with bed mobility. increased time required and encouragement needed for participation    Transfers Overall transfer level: Needs assistance   Transfers: Lateral/Scoot Transfers          Lateral/Scoot Transfers: Supervision General transfer comment: patient able to scoot along edge of bed x 4 bouts with supervision and verbal cues for technique. patient refused to put TLSO on due abdominal tenderness around injection sites despite maximal encouragement to wear the brace. patient declined getting out of bed at this time. patient does report mild dizziness in sitting that subsides with increased time. blood pressure in sitting 87/62 which was an improvement from laying in bed at 71/48.    Balance Overall balance assessment: Needs assistance Sitting-balance support: Feet supported;Bilateral upper extremity supported Sitting balance-Leahy Scale: Fair                                     ADL either performed or assessed with clinical judgement   ADL Overall ADL's : Needs assistance/impaired                                       General ADL Comments: MAX A don B socks at bed level. SETUP self-feeding bed level. Pt tolerated ~10 mins sitting EOB c MIN cues for encouragement and positioning, pt deferred donning TLSO brace      Cognition Arousal/Alertness: Awake/alert Behavior During Therapy: WFL for tasks assessed/performed Overall Cognitive Status: Within Functional Limits for tasks assessed  General Comments: patient able to follow all commands without difficulty        Exercises Exercises: Other exercises Other Exercises Other Exercises: Pt educated re: OT role, DME recs, d/c recs, falls prevention, ECS, importance of brace, spinal pcns Other Exercises: LBD, UBD, log rolling, sitting balance/tolerance, lateral scoot      General Comments RN in to assess abdominal swelling     Pertinent Vitals/ Pain       Pain Assessment: Faces Faces Pain Scale: Hurts little more Pain Location: abdomen Pain Descriptors / Indicators: Sore Pain Intervention(s): Monitored during session;Repositioned         Frequency  Min 1X/week        Progress Toward Goals  OT Goals(current goals can now be found in the care plan section)  Progress towards OT goals: Progressing toward goals  Acute Rehab OT Goals Patient Stated Goal: to get to a rehab facility for 3 months OT Goal Formulation: With patient Time For Goal Achievement: 04/16/21 ADL Goals Pt Will Perform Grooming: with modified independence;with set-up;standing Pt Will Transfer to Toilet: stand pivot transfer;with min guard assist;regular height toilet Additional ADL Goal #1: Pt will be able to identify/describe 2+ energy conservation techniques.  Plan Discharge plan remains appropriate;Frequency needs to be updated    Co-evaluation    PT/OT/SLP Co-Evaluation/Treatment: Yes Reason for Co-Treatment: Complexity of the patient's impairments (multi-system involvement);To address functional/ADL transfers PT goals addressed during session: Mobility/safety with mobility OT goals addressed during session: ADL's and self-care      AM-PAC OT "6 Clicks" Daily Activity     Outcome Measure   Help from another person eating meals?: A Little Help from another person taking care of personal grooming?: A Little Help from another person toileting, which includes using toliet, bedpan, or urinal?: A Lot Help from another person bathing (including washing, rinsing, drying)?: A Lot Help from another person to put on and taking off regular upper body clothing?: A Little Help from another person to put on and taking off regular lower body clothing?: A Lot 6 Click Score: 15    End of Session    OT Visit Diagnosis: Unsteadiness on feet (R26.81);Muscle weakness (generalized) (M62.81)   Activity Tolerance Patient limited by  fatigue   Patient Left in bed;with call bell/phone within reach;with bed alarm set   Nurse Communication          Time: 8938-1017 OT Time Calculation (min): 31 min  Charges: OT General Charges $OT Visit: 1 Visit OT Treatments $Self Care/Home Management : 8-22 mins  Ryan [name], M.S. OTR/L  04/05/21, 1:23 PM  ascom [phone]

## 2021-04-05 NOTE — NC FL2 (Signed)
[place] LEVEL OF CARE SCREENING TOOL     IDENTIFICATION  Patient Name: Ryan [name] Birthdate: 13-Jun-1961 Sex: male Admission Date (Current Location): 03/30/2021  [facility] and [place] Number:  Engineering geologist and Address:  [facility], [address], [place], [place] [address]      Provider Number: [id number]  Attending Physician Name and Address:  Sharen Hones, MD  Relative Name and Phone Number:  Fredrich Romans [phone]    Current Level of Care: Hospital Recommended Level of Care: [place] Prior Approval Number:    Date Approved/Denied:   PASRR Number: [id number]  Discharge Plan: SNF    Current Diagnoses: Patient Active Problem List   Diagnosis Date Noted   Acute anemia 03/30/2021   Thrombocytopenia ([place]) 03/30/2021   Thoracic compression fracture, closed, initial encounter ([place]) 03/30/2021   Pathologic fracture of thoracic vertebrae, initial encounter 03/30/2021   Malnourished ([place]) 03/30/2021   Protein-calorie malnutrition, severe ([place]) 03/30/2021   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   Infestation by bed bug 03/30/2021   Fall    Transaminitis    Pain    Closed right hip fracture ([place]) 04/01/2020   Symptomatic anemia 04/01/2020   Hyponatremia 04/01/2020   Closed rib fracture 04/01/2020   Leukocytosis 04/01/2020   Rhabdomyolysis 04/01/2020   Dehydration 04/01/2020    Orientation RESPIRATION BLADDER Height & Weight     Self, Time, Situation, Place  Normal Continent Weight: 54.7 kg (bed weight) Height:  6\' 5"  (195.6 cm)  BEHAVIORAL SYMPTOMS/MOOD NEUROLOGICAL BOWEL NUTRITION STATUS      Continent Diet (regular)  AMBULATORY STATUS COMMUNICATION OF NEEDS Skin   Extensive Assist Verbally Normal                       Personal Care Assistance Level of Assistance  Bathing, Dressing Bathing Assistance: Limited assistance   Dressing Assistance: Limited assistance     Functional Limitations  Info             SPECIAL CARE FACTORS FREQUENCY  PT (By licensed PT)     PT Frequency: 5 times per week              Contractures Contractures Info: Not present    Additional Factors Info  Code Status, Allergies Code Status Info: FUll code Allergies Info: NKDA           Current Medications (04/05/2021):  This is the current hospital active medication list Current Facility-Administered Medications  Medication Dose Route Frequency Provider Last Rate Last Admin   bisacodyl (DULCOLAX) EC tablet 5 mg  5 mg Oral Daily PRN Val Riles, MD       bisacodyl (DULCOLAX) suppository 10 mg  10 mg Rectal Daily PRN Val Riles, MD       calcium carbonate (OS-CAL - dosed in mg of elemental calcium) tablet 500 mg of elemental calcium  1 tablet Oral TID WC Mansy, Jan A, MD   500 mg of elemental calcium at 04/05/21 1202   enoxaparin (LOVENOX) injection 40 mg  40 mg Subcutaneous Q24H Cox, Amy N, DO   40 mg at 04/02/21 2137   feeding supplement (ENSURE ENLIVE / ENSURE PLUS) liquid 237 mL  237 mL Oral TID BM Val Riles, MD       ferrous sulfate tablet 325 mg  325 mg Oral Q breakfast Cox, Amy N, DO   325 mg at 50/53/97 6734   folic acid (FOLVITE) tablet 1 mg  1 mg  Oral Daily Cox, Amy N, DO   1 mg at 04/05/21 0840   guaiFENesin-dextromethorphan (ROBITUSSIN DM) 100-10 MG/5ML syrup 5 mL  5 mL Oral Q4H PRN Val Riles, MD   5 mL at 04/05/21 1205   hydrOXYzine (ATARAX/VISTARIL) tablet 25 mg  25 mg Oral TID PRN Val Riles, MD   25 mg at 04/03/21 1323   melatonin tablet 5 mg  5 mg Oral QHS Val Riles, MD   5 mg at 04/04/21 2100   midodrine (PROAMATINE) tablet 10 mg  10 mg Oral TID WC Val Riles, MD   10 mg at 04/05/21 1200   multivitamin with minerals tablet 1 tablet  1 tablet Oral Daily Cox, Amy N, DO   1 tablet at 04/05/21 0840   ondansetron (ZOFRAN) tablet 4 mg  4 mg Oral Q6H PRN Cox, Amy N, DO       Or   ondansetron (ZOFRAN) injection 4 mg  4 mg Intravenous Q6H PRN Cox, Amy N, DO        oxyCODONE (Oxy IR/ROXICODONE) immediate release tablet 5 mg  5 mg Oral Q6H PRN Val Riles, MD   5 mg at 04/05/21 0520   phenol (CHLORASEPTIC) mouth spray 1 spray  1 spray Mouth/Throat PRN Val Riles, MD   1 spray at 04/02/21 0155   polyethylene glycol (MIRALAX / GLYCOLAX) packet 17 g  17 g Oral Daily Val Riles, MD   17 g at 04/02/21 1055   sodium chloride tablet 1 g  1 g Oral BID WC Sharen Hones, MD       traZODone (DESYREL) tablet 50 mg  50 mg Oral QHS PRN Val Riles, MD   50 mg at 04/02/21 2147   Vitamin D (Ergocalciferol) (DRISDOL) capsule 50,000 Units  50,000 Units Oral Q7 days Val Riles, MD   50,000 Units at 03/31/21 1729     Discharge Medications: Please see discharge summary for a list of discharge medications.  Relevant Imaging Results:  Relevant Lab Results:   Additional Information SS# [id number]  Su Hilt, RN

## 2021-04-06 DIAGNOSIS — M8448XA Pathological fracture, other site, initial encounter for fracture: Secondary | ICD-10-CM

## 2021-04-06 DIAGNOSIS — C7951 Secondary malignant neoplasm of bone: Secondary | ICD-10-CM

## 2021-04-06 DIAGNOSIS — C61 Malignant neoplasm of prostate: Principal | ICD-10-CM

## 2021-04-06 LAB — CBC WITH DIFFERENTIAL/PLATELET
Abs Immature Granulocytes: 0.07 10*3/uL (ref 0.00–0.07)
Basophils Absolute: 0 10*3/uL (ref 0.0–0.1)
Basophils Relative: 1 %
Eosinophils Absolute: 0.1 10*3/uL (ref 0.0–0.5)
Eosinophils Relative: 2 %
HCT: 28.2 % — ABNORMAL LOW (ref 39.0–52.0)
Hemoglobin: 9.3 g/dL — ABNORMAL LOW (ref 13.0–17.0)
Immature Granulocytes: 1 %
Lymphocytes Relative: 26 %
Lymphs Abs: 1.3 10*3/uL (ref 0.7–4.0)
MCH: 26.4 pg (ref 26.0–34.0)
MCHC: 33 g/dL (ref 30.0–36.0)
MCV: 80.1 fL (ref 80.0–100.0)
Monocytes Absolute: 0.5 10*3/uL (ref 0.1–1.0)
Monocytes Relative: 9 %
Neutro Abs: 3.1 10*3/uL (ref 1.7–7.7)
Neutrophils Relative %: 61 %
Platelets: 173 10*3/uL (ref 150–400)
RBC: 3.52 MIL/uL — ABNORMAL LOW (ref 4.22–5.81)
RDW: 19 % — ABNORMAL HIGH (ref 11.5–15.5)
WBC: 5 10*3/uL (ref 4.0–10.5)
nRBC: 0 % (ref 0.0–0.2)

## 2021-04-06 LAB — PHOSPHORUS: Phosphorus: 2.7 mg/dL (ref 2.5–4.6)

## 2021-04-06 LAB — BASIC METABOLIC PANEL
Anion gap: 6 (ref 5–15)
BUN: 6 mg/dL (ref 6–20)
CO2: 19 mmol/L — ABNORMAL LOW (ref 22–32)
Calcium: 7.3 mg/dL — ABNORMAL LOW (ref 8.9–10.3)
Chloride: 105 mmol/L (ref 98–111)
Creatinine, Ser: 0.55 mg/dL — ABNORMAL LOW (ref 0.61–1.24)
GFR, Estimated: >60 mL/min (ref >60)
Glucose, Bld: 108 mg/dL — ABNORMAL HIGH (ref 70–99)
Potassium: 4.6 mmol/L (ref 3.5–5.1)
Sodium: 130 mmol/L — ABNORMAL LOW (ref 135–145)

## 2021-04-06 LAB — MAGNESIUM: Magnesium: 1.5 mg/dL — ABNORMAL LOW (ref 1.7–2.4)

## 2021-04-06 MED ORDER — MAGNESIUM SULFATE IN D5W 1-5 GM/100ML-% IV SOLN
1.0000 g | Freq: Once | INTRAVENOUS | Status: AC
  Administered 2021-04-06: 1 g via INTRAVENOUS
  Filled 2021-04-06: qty 100

## 2021-04-06 MED ORDER — MAGNESIUM SULFATE 50 % IJ SOLN: 3.0000 g | Freq: Once | INTRAVENOUS | Status: DC

## 2021-04-06 MED ORDER — MAGNESIUM SULFATE 2 GM/50ML IV SOLN
2.0000 g | Freq: Once | INTRAVENOUS | Status: AC
  Administered 2021-04-06: 2 g via INTRAVENOUS
  Filled 2021-04-06: qty 50

## 2021-04-06 NOTE — Progress Notes (Signed)
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 TLSO recommended from nurosurgery and devliered to room.   OT comments  Pt. education was provided about A/E use for LE ADLs. Pt. Was able to demonstrate general A/E use. Pt. is independent peri hygiene,  independent hand hygiene. Pt. Could benefit from OT services for ADL training, A/E training, and pt. education about home modification, and DME. Pt. would benefit from SNF level of care upon discharge, with follow-up OT services.    Follow Up Recommendations  SNF    Equipment Recommendations  Other (comment)    Recommendations for Other Services      Precautions / Restrictions Precautions Precautions: Fall Spinal Brace: Thoracolumbosacral orthotic Restrictions Weight Bearing Restrictions: No       Mobility Bed Mobility               General bed mobility comments: Pt. seen at bed level per pt. request.    Transfers        Deferred Per pt. request              Balance                                           ADL either performed or assessed with clinical judgement   ADL                   Upper Body Dressing : Moderate assistance;Bed level           Toileting- Clothing Manipulation and Hygiene: Bed level;Set up               Vision Baseline Vision/History: No visual deficits Patient Visual Report: No change from baseline     Perception     Praxis      Cognition Arousal/Alertness:  Awake/alert Behavior During Therapy: WFL for tasks assessed/performed Overall Cognitive Status: Within Functional Limits for tasks assessed                                          Exercises     Shoulder Instructions       General Comments      Pertinent Vitals/ Pain       Pain Assessment: Faces Faces Pain Scale: Hurts a little bit Pain Location: abdomen Pain Descriptors / Indicators: Sore  Home Living                                          Prior Functioning/Environment              Frequency  Min 1X/week        Progress Toward Goals  OT Goals(current goals can now be found in the care plan section)  Progress towards OT goals: Progressing toward goals  Acute  Rehab OT Goals Patient Stated Goal: to get to a rehab facility for 3 months OT Goal Formulation: With patient  Plan Discharge plan remains appropriate;Frequency needs to be updated    Co-evaluation                 AM-PAC OT "6 Clicks" Daily Activity     Outcome Measure   Help from another person eating meals?: A Little Help from another person taking care of personal grooming?: A Little Help from another person toileting, which includes using toliet, bedpan, or urinal?: A Lot Help from another person bathing (including washing, rinsing, drying)?: A Lot Help from another person to put on and taking off regular upper body clothing?: A Little Help from another person to put on and taking off regular lower body clothing?: A Lot 6 Click Score: 15    End of Session Equipment Utilized During Treatment: Gait belt  OT Visit Diagnosis: Unsteadiness on feet (R26.81);Muscle weakness (generalized) (M62.81)   Activity Tolerance Patient limited by fatigue   Patient Left in bed;with call bell/phone within reach;with bed alarm set   Nurse Communication          Time: 9794-8016 OT Time Calculation (min): 22 min  Charges: OT General Charges $OT Visit: 1  Visit OT Treatments $Self Care/Home Management : 8-22 mins  Harrel Carina, MS, OTR/L    Harrel Carina 04/06/2021, 4:59 PM

## 2021-04-06 NOTE — TOC Progression Note (Signed)
Transition of Care [facility]) - Progression Note    Patient Details  Name: Ryan [name] MRN: [id number] Date of Birth: 1961-05-31  Transition of Care [facility]) CM/SW [place], RN Phone Number: 04/06/2021, 10:10 AM  Clinical Narrative:     [facility] no bed offers, sent out to all facilities       Expected Discharge Plan and Services                                                 Social Determinants of Health (SDOH) Interventions    Readmission Risk Interventions No flowsheet data found.

## 2021-04-06 NOTE — Progress Notes (Signed)
Daily Progress Note   Patient Name: Ryan [name]       Date: 04/06/2021 DOB: 03/23/1961  Age: 60 y.o. MRN#: [id number] Attending Physician: Sharen Hones, MD Primary Care Physician: Pcp, No Admit Date: 03/30/2021  Reason for Consultation/Follow-up: Establishing goals of care  Subjective: Chart Reviewed. Updates Received. Patient Assessed.   Ryan [name] is sitting up in bed watching tv. Denies pain or shortness of breath. States he feels better each day. States appetite is improving, now that he knows all of the available food options that he can order.   Reviewed goals of care discussions. He was able to appropriately summarize our discussion form yesterday. He states speaking with some of his family on yesterday evening and sharing our discussions. More specifically with his aunt who is would like to be his decision maker.   He shares his hopes that he will be able to go to SNF rehab and regain some strength and continue with required treatment options as advised.   He spends time sharing memories from his past. Listening and support provided.   [name] is remaining hopeful for his improvement and is clear expressing he is not prepared to face end-of-life, although when that time comes his goal will to be comfortable with minimal suffering. In the meantime he is prepared to do everything possible to continue thrive. Continued education on palliative support and the importance of medical compliance.   All questions answered and support provided.   Length of Stay: 7 days  Vital Signs: BP 91/61 (BP Location: Left Arm)   Pulse 72   Temp 98.6 F (37 C)   Resp 18   Ht _0  (1.956 m)   Wt 54.7 kg Comment: bed weight  SpO2 99%   BMI 14.30 kg/m  SpO2: SpO2: 99 % O2 Device: O2 Device: Room Air O2 Flow Rate: O2 Flow Rate (L/min): 0 L/min  Physical Exam: NAD, cachectic  RRR Normal breathing pattern AAO x3, mood appropriate            Palliative Care Assessment & Plan  HPI:    Palliative Care consult requested for goals of care discussion in this 60 y.o. male with a medical history significant for polysubstance abuse including THC, cocaine, alcohol, remote history of tobacco use, history of right proximal femur inter trochanteric fracture status post right IM nail intertrochanteric fixation (04/02/2020) and rib fractures s/p mechanical fall and lying on the floor x2 days. He presented to the ED from home with complaints of right hip pain. Per notations patient reports pain has been ongoing for several months. During work-up hemoglobin 5.6. Alk phos 884. He received 2 units Packed Red blood cells. CT abdomen pelvis showed diffuse skeletal metastatic disease, pathological right-sided rib fractures. Oncology following and treating in the setting of PSA greater than 3000. 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 Patient started on [place] and [place].   Code Status: Full code  Goals of Care/Recommendations: Continue to treat the treatable Patient remaining hopeful for improvement/stability. SNF rehab placement. Ongoing discussions with family.  PMT will continue to support and follow.   Prognosis: Unable to determine  Discharge Planning: To Be Determined with ongoing palliative support.   Thank you for allowing the Palliative Medicine Team to assist in the care of this patient.  Time Total:  40 min.   Visit consisted of counseling and education dealing with the complex and emotionally intense issues of symptom management and palliative care in the setting of serious  and potentially life-threatening illness.Greater than 50%  of this time was spent counseling and coordinating care related to the above assessment and plan.  Alda Lea, AGPCNP-BC  Palliative Medicine Team [phone]

## 2021-04-06 NOTE — Progress Notes (Signed)
PT Cancellation Note  Patient Details Name: Ryan [name] MRN: [id number] DOB: 06-Nov-1960   Cancelled Treatment:    Reason Eval/Treat Not Completed: Patient declined, no reason specified.  Pt pleasantly refused and had a plethora of reasons why he didn't want to do anything this AM.  Notified him that PT may not be able to get back to him today and he was solid on wanting to eat and get a nap this AM.     Kreg Shropshire, DPT 04/06/2021, 3:55 PM

## 2021-04-06 NOTE — Progress Notes (Signed)
PROGRESS NOTE    Ryan [name]  GMW:[id number] DOB: [birth year] DOA: 03/30/2021 PCP: Pcp, No   Follow-up metastatic cancer Brief Narrative:  Ryan [name] is a 60 y.o. male with medical history significant for history of polysubstance abuse including THC, cocaine, alcohol, remote history of tobacco use, history of right proximal femur inter trochanteric fracture status post right IM nail intertrochanteric fixation in 04/02/2021, does not follow-up with a PCP, presents to the emergency department for chief concerns of right hip pain.  Pain is chronic for several months, denies any trauma, difficulty ambulation due to pain. He also endorses an unintentional 35 pound weight loss in the last year, denies bright red blood per rectum and melena. ED work-up, hypotensive, saturating well on room air, Hb 5.6, WBC 6.2, platelet 121, serum sodium 126, potassium 3.9, chloride 97, bicarb 17, BUN 9, serum creatinine of 0.64, nonfasting blood glucose 86.  eGFR is greater than 60. AST 51, ALT 8. Alk phos elevated at 884. Upon arriving the hospital, patient was found to have L5 compression fracture, L3/4 severe stenosis.  She was also found to have widespread bony metastasis  from prostate cancer.   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  Prostate cancer with widespread bony metastasis. L5 compression fracture. 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 Patient is followed by oncology, continue PT/OT.  Patient was not able to tolerate LSO brace.  2.  Severe symptomatic anemia. Folic acid  deficiency. Thrombocytopenia. Continue folic acid supplements, status post PRBC, hemoglobin stable.  3.  Hypotension. Continue midodrine, blood pressure is better.  4.  Severe protein calorie malnutrition.  Continue supplement.   5.  Hypokalemia. Hypomagnesemia Hypophosphatemia. Hypocalcemia. Hyponatremia. Give another dose of IV magnesium.  Continue sodium chloride tablets, sodium level gradually improving.  Hypocalcemia is due to severe hypoalbuminemia.    DVT prophylaxis: Lovenox Code Status: full Family Communication: Aunt updated per patient request. Disposition Plan:    Status is: Inpatient  Remains inpatient appropriate because:Unsafe d/c plan  Dispo: The patient is from: Home              Anticipated d/c is to: SNF              Patient currently is medically stable to d/c.   Difficult to place patient Yes        I/O last 3 completed shifts: In: 910 [P.O.:60; I.V.:800; IV Piggyback:50] Out: 4403 [Urine:1650] No intake/output data recorded.     Consultants:  Oncology, neurosurgery  Procedures: None  Antimicrobials: None   Subjective: Patient doing well today, he has some mild right knee pain, no swelling.  No fever or chills. No abdominal pain or nausea vomiting. No dysuria hematuria No chest pain palpitation. No short of breath or cough.  Objective: Vitals:   04/05/21 1918 04/06/21 0053 04/06/21 0448 04/06/21 0808  BP: 108/68 98/61 107/67 (!) 91/57  Pulse: 63 75 80 79  Resp: _0 Temp: 97.7 F (36.5 C) 98.2 F (36.8 C) 98.2 F (36.8 C) 97.8 F (36.6 C)  TempSrc: Oral Oral Oral Oral  SpO2: 100% 96% 96% 99%  Weight:      Height:        Intake/Output Summary (Last 24 hours) at 04/06/2021 1040 Last data filed at 04/06/2021 0550 Gross per 24 hour  Intake 50 ml  Output 750 ml  Net -700 ml   Filed Weights   03/30/21 1030 04/01/21 1232  Weight: 59 kg 54.7 kg    Examination:  General exam: Cachectic, severely  malnourished. Respiratory system: Clear to auscultation. Respiratory effort normal. Cardiovascular system: S1 & S2 heard, RRR. No JVD, murmurs, rubs, gallops or clicks. No pedal edema. Gastrointestinal system: Abdomen is nondistended, soft and nontender. No organomegaly or masses felt. Normal bowel sounds heard. Central nervous system: Alert and oriented. No focal neurological deficits. Extremities: Severe muscle atrophy Skin: No rashes, lesions or ulcers Psychiatry: Judgement and insight appear normal. Mood & affect appropriate.     Data Reviewed: I have personally reviewed following labs and imaging studies  CBC: Recent Labs  Lab 04/02/21 0443 04/03/21 0403 04/04/21 0505 04/05/21 0647 04/06/21 0700  WBC 5.1 5.5 5.6 4.7 5.0  NEUTROABS 3.2 3.7 3.7 3.1 3.1  HGB 9.1* 9.0* 9.2* 9.4* 9.3*  HCT 26.5* 27.3* 27.0* 27.7* 28.2*  MCV 78.2* 80.1 78.9* 78.7* 80.1  PLT 110* 115* 131* 153 882   Basic Metabolic Panel: Recent Labs  Lab 04/02/21 0443 04/03/21 0403 04/04/21 0505 04/05/21 0647 04/06/21 0700  NA 131* 128* 126* 128* 130*  K 3.4* 4.4 4.2 4.4 4.6  CL 107 103 102 104 105  CO2 20* 18* 19* 19* 19*  GLUCOSE 93 99 93 84 108*  BUN 5* 5* 6 5* 6  CREATININE 0.48* 0.47* 0.58* 0.41* 0.55*  CALCIUM 6.4* 5.4* 5.2* 6.1* 7.3*  MG 1.6* 1.5* 1.8 1.5* 1.5*  PHOS 2.3* 1.9* 2.5 2.4* 2.7   GFR: Estimated Creatinine Clearance: 76.9 mL/min (A) (by C-G formula based on SCr of 0.55 mg/dL (L)). Liver Function Tests: Recent Labs  Lab 03/30/21 1135 04/01/21 0719 04/02/21 0443 04/03/21 0403 04/04/21 0505  AST 51* 45* 30 35  --   ALT _0 --   ALKPHOS 884* 593* 566* 504*  --   BILITOT 0.6 0.6 0.9 0.7  --   PROT 6.2* 4.5* 4.9* 4.8*  --   ALBUMIN 2.9* 1.9* 2.0* 2.0* 2.0*   No results for input(s): LIPASE, AMYLASE in the last 168 hours. No results for input(s): AMMONIA in the last 168 hours. Coagulation Profile: No results for input(s): INR, PROTIME in the last 168 hours. Cardiac  Enzymes: Recent Labs  Lab 03/30/21 1135  CKTOTAL 141   BNP (last 3 results) No results for input(s): PROBNP in the last 8760 hours. HbA1C: No results for input(s): HGBA1C in the last 72 hours. CBG: Recent Labs  Lab 04/01/21 1745  GLUCAP 108*   Lipid Profile: No results for input(s): CHOL, HDL, LDLCALC, TRIG, CHOLHDL, LDLDIRECT in the last 72 hours. Thyroid Function Tests: No results for input(s): TSH, T4TOTAL, FREET4, T3FREE, THYROIDAB in the last 72 hours. Anemia Panel: No results for input(s): VITAMINB12, FOLATE, FERRITIN, TIBC, IRON, RETICCTPCT in the last 72 hours. Sepsis Labs: Recent Labs  Lab 03/30/21 1420  LATICACIDVEN 2.6*    Recent Results (from the past 240 hour(s))  Resp Panel by RT-PCR (Flu A&B, Covid) Nasopharyngeal Swab     Status: None   Collection Time: 03/30/21  1:37 PM   Specimen: Nasopharyngeal Swab; Nasopharyngeal(NP) swabs in vial transport medium  Result Value Ref Range Status  SARS Coronavirus 2 by RT PCR NEGATIVE NEGATIVE Final    Comment: (NOTE) SARS-CoV-2 target nucleic acids are NOT DETECTED.  The SARS-CoV-2 RNA is generally detectable in upper respiratory specimens during the acute phase of infection. The lowest concentration of SARS-CoV-2 viral copies this assay can detect is 138 copies/mL. A negative result does not preclude SARS-Cov-2 infection and should not be used as the sole basis for treatment or other patient management decisions. A negative result may occur with  improper specimen collection/handling, submission of specimen other than nasopharyngeal swab, presence of viral mutation(s) within the areas targeted by this assay, and inadequate number of viral copies(<138 copies/mL). A negative result must be combined with clinical observations, patient history, and epidemiological information. The expected result is Negative.  Fact Sheet for Patients:  EntrepreneurPulse.com.au  Fact Sheet for Healthcare  Providers:  IncredibleEmployment.be  This test is no t yet approved or cleared by the [place] FDA and  has been authorized for detection and/or diagnosis of SARS-CoV-2 by FDA under an Emergency Use Authorization (EUA). This EUA will remain  in effect (meaning this test can be used) for the duration of the COVID-19 declaration under Section 564(b)(1) of the Act, 21 U.S.C.section 360bbb-3(b)(1), unless the authorization is terminated  or revoked sooner.       Influenza A by PCR NEGATIVE NEGATIVE Final   Influenza B by PCR NEGATIVE NEGATIVE Final    Comment: (NOTE) The Xpert Xpress SARS-CoV-2/FLU/RSV plus assay is intended as an aid in the diagnosis of influenza from Nasopharyngeal swab specimens and should not be used as a sole basis for treatment. Nasal washings and aspirates are unacceptable for Xpert Xpress SARS-CoV-2/FLU/RSV testing.  Fact Sheet for Patients: EntrepreneurPulse.com.au  Fact Sheet for Healthcare Providers: IncredibleEmployment.be  This test is not yet approved or cleared by the [place] FDA and has been authorized for detection and/or diagnosis of SARS-CoV-2 by FDA under an Emergency Use Authorization (EUA). This EUA will remain in effect (meaning this test can be used) for the duration of the COVID-19 declaration under Section 564(b)(1) of the Act, 21 U.S.C. section 360bbb-3(b)(1), unless the authorization is terminated or revoked.  Performed at [facility], [address], [place], [place] [address]          Radiology Studies: No results found.      Scheduled Meds:  calcium carbonate  1 tablet Oral TID WC   enoxaparin (LOVENOX) injection  40 mg Subcutaneous Q24H   feeding supplement  237 mL Oral TID BM   ferrous sulfate  325 mg Oral Q breakfast   folic acid  1 mg Oral Daily   melatonin  5 mg Oral QHS   midodrine  10 mg Oral TID WC   multivitamin with minerals  1  tablet Oral Daily   polyethylene glycol  17 g Oral Daily   sodium chloride  1 g Oral BID WC   Vitamin D (Ergocalciferol)  50,000 Units Oral Q7 days   Continuous Infusions:  magnesium sulfate bolus IVPB 1 g (04/06/21 0949)   Followed by   magnesium sulfate bolus IVPB       LOS: 7 days    Time spent: 34 minutes, more than 50% time involved in direct patient care.    Sharen Hones, MD [facility]   To contact the attending provider between 7A-7P or the covering provider during after hours 7P-7A, please log into the web site [email or web address] and access using universal [place] password for that web site. If  you do not have the password, please call the hospital operator.  04/06/2021, 10:40 AM

## 2021-04-07 DIAGNOSIS — I959 Hypotension, unspecified: Secondary | ICD-10-CM

## 2021-04-07 DIAGNOSIS — C7951 Secondary malignant neoplasm of bone: Secondary | ICD-10-CM

## 2021-04-07 DIAGNOSIS — C61 Malignant neoplasm of prostate: Principal | ICD-10-CM

## 2021-04-07 LAB — CBC WITH DIFFERENTIAL/PLATELET
Abs Immature Granulocytes: 0.15 10*3/uL — ABNORMAL HIGH (ref 0.00–0.07)
Basophils Absolute: 0 10*3/uL (ref 0.0–0.1)
Basophils Relative: 1 %
Eosinophils Absolute: 0 10*3/uL (ref 0.0–0.5)
Eosinophils Relative: 1 %
HCT: 30.6 % — ABNORMAL LOW (ref 39.0–52.0)
Hemoglobin: 10.3 g/dL — ABNORMAL LOW (ref 13.0–17.0)
Immature Granulocytes: 2 %
Lymphocytes Relative: 19 %
Lymphs Abs: 1.2 10*3/uL (ref 0.7–4.0)
MCH: 26.2 pg (ref 26.0–34.0)
MCHC: 33.7 g/dL (ref 30.0–36.0)
MCV: 77.9 fL — ABNORMAL LOW (ref 80.0–100.0)
Monocytes Absolute: 0.6 10*3/uL (ref 0.1–1.0)
Monocytes Relative: 10 %
Neutro Abs: 4.5 10*3/uL (ref 1.7–7.7)
Neutrophils Relative %: 67 %
Platelets: 143 10*3/uL — ABNORMAL LOW (ref 150–400)
RBC: 3.93 MIL/uL — ABNORMAL LOW (ref 4.22–5.81)
RDW: 19 % — ABNORMAL HIGH (ref 11.5–15.5)
WBC: 6.5 10*3/uL (ref 4.0–10.5)
nRBC: 0 % (ref 0.0–0.2)

## 2021-04-07 LAB — BASIC METABOLIC PANEL
Anion gap: 12 (ref 5–15)
BUN: 7 mg/dL (ref 6–20)
CO2: 16 mmol/L — ABNORMAL LOW (ref 22–32)
Calcium: 8 mg/dL — ABNORMAL LOW (ref 8.9–10.3)
Chloride: 98 mmol/L (ref 98–111)
Creatinine, Ser: 0.54 mg/dL — ABNORMAL LOW (ref 0.61–1.24)
GFR, Estimated: >60 mL/min (ref >60)
Glucose, Bld: 104 mg/dL — ABNORMAL HIGH (ref 70–99)
Potassium: 5.2 mmol/L — ABNORMAL HIGH (ref 3.5–5.1)
Sodium: 126 mmol/L — ABNORMAL LOW (ref 135–145)

## 2021-04-07 LAB — MAGNESIUM: Magnesium: 1.6 mg/dL — ABNORMAL LOW (ref 1.7–2.4)

## 2021-04-07 MED ORDER — MAGNESIUM SULFATE 2 GM/50ML IV SOLN
2.0000 g | Freq: Once | INTRAVENOUS | Status: AC
  Administered 2021-04-07: 2 g via INTRAVENOUS
  Filled 2021-04-07: qty 50

## 2021-04-07 NOTE — TOC Progression Note (Signed)
Transition of Care [facility]) - Progression Note    Patient Details  Name: Ryan [name] MRN: [id number] Date of Birth: Feb 14, 1961  Transition of Care [facility]) CM/SW [place], RN Phone Number: 04/07/2021, 9:09 AM  Clinical Narrative:    Resent the bed request still looking for a STR bed for the patient, Amy Fredderick Severance with [facility] has been checking on the patient's medicaid application and working with the patient. Still no bed offers, will continue to serarchfor a bed        Expected Discharge Plan and Services                                                 Social Determinants of Health (SDOH) Interventions    Readmission Risk Interventions No flowsheet data found.

## 2021-04-07 NOTE — Progress Notes (Signed)
Nutrition Follow-up  DOCUMENTATION CODES:  Severe malnutrition in context of chronic illness, Underweight  INTERVENTION:  Continue Ensure Enlive TID.  Continue double protein portions with meals.  Continue snacks.  Continue Magic Cup TID.  Continue MVI with minerals daily.  If PO intake does not improve, may need to consider nutrition support soon.  NUTRITION DIAGNOSIS:  Severe Malnutrition related to cancer and cancer related treatments as evidenced by severe muscle depletion, severe fat depletion. - ongoing  GOAL:  Patient will meet greater than or equal to 90% of their needs - not meeting  MONITOR:  PO intake, Weight trends  REASON FOR ASSESSMENT:  Consult Assessment of nutrition requirement/status  ASSESSMENT:  60 y.o. male presented to ED for intermittent right lateral hip pain that is been ongoing for the past several weeks. Pt had surgery 10 months ago but reports he has not followed-up for pain. Imaging in ED concerning for marrow infiltrative process. PMH relevant for polysubstance abuse.  Found to have prostate cancer with widespread bony metastasis.  Spoke with pt at bedside briefly. Pt reports drinking all of his supplements and eating the Magic Cups at each meal. He reports that the bacon this morning did not taste very good. He also reports that he has family members bringing him foods he likes from outside the hospital - RD agreed that this is acceptable.  RD concerned regarding poor documented intake - RD to communicate to MD regarding the possible need for a feeding tube placement to optimize nutrition.  Average Meal Intake: 6/26-6/28: ~32% (4 documented meals)  Medications: reviewed; Os-Cal TID, EE TID, ferrous sulfate, folic acid, MVI with minerals, miralax, Vitamin D, Mag-Sulfate via IV, oxycodone PRN (given once today)  Labs: reviewed; Na 126 (L), K 5.2 (H), Glucose 104 (H), Mag 1.6 (L)  Diet Order:   Diet Order             Diet regular Room  service appropriate? Yes; Fluid consistency: Thin; Fluid restriction: 1500 mL Fluid  Diet effective now                  EDUCATION NEEDS:  Education needs have been addressed  Skin:  Skin Assessment: Reviewed RN Assessment  Last BM:  04/05/21 - Type 6, medium  Height:  Ht Readings from Last 1 Encounters:  03/30/21 6\' 5"  (1.956 m)   Weight:  Wt Readings from Last 1 Encounters:  04/01/21 54.7 kg   Ideal Body Weight:  94.5 kg  BMI:  Body mass index is 14.3 kg/m.  Estimated Nutritional Needs:  Kcal:  2400-2600 kcal/d Protein:  130-150 g/d Fluid:  >2.5L/d  Derrel Nip, RD, LDN (she/her/hers) Registered Dietitian I After-Hours/Weekend Pager # in [place]

## 2021-04-07 NOTE — Progress Notes (Signed)
   Daily Progress Note   Patient Name: Ryan [name]       Date: 04/07/2021 DOB: [birth year]  Age: 60 y.o. MRN#: [id number] Attending Physician: Sharen Hones, MD Primary Care Physician: Pcp, No Admit Date: 03/30/2021  Reason for Consultation/Follow-up: Establishing goals of care  Subjective: Chart Reviewed. Updates Received. Patient Assessed.   Patient resting in bed, easily aroused. Denies pain. States he feels better each day. Continues to show appreciation of improved appetite and feeling of increased strength.   Ongoing goals of care discussion with expressed wishes to continue with full scope care and interventions to allow him every opportunity to show improvement.   All questions answered and support provided.   Length of Stay: 8 days  Vital Signs: BP 100/61 (BP Location: Left Arm)   Pulse 73   Temp (!) 97.5 F (36.4 C)   Resp 18   Ht $R'6\' 5"'Dc$  (1.956 m)   Wt 54.7 kg Comment: bed weight  SpO2 100%   BMI 14.30 kg/m  SpO2: SpO2: 100 % O2 Device: O2 Device: Room Air O2 Flow Rate: O2 Flow Rate (L/min): 0 L/min  Physical Exam: NAD, resting easily aroused, cachectic RRR Normal breathing pattern Muscle/temporal wasting AAO x3, mood appropriate       Palliative Care Assessment & Plan  HPI:   Palliative Care consult requested for goals of care discussion in this 60 y.o. male with a medical history significant for polysubstance abuse including THC, cocaine, alcohol, remote history of tobacco use, history of right proximal femur inter trochanteric fracture status post right IM nail intertrochanteric fixation (04/02/2020) and rib fractures s/p mechanical fall and lying on the floor x2 days. He presented to the ED from home with complaints of right hip pain. Per notations patient reports pain has been ongoing for several months. During work-up hemoglobin 5.6. Alk phos 884. He received 2 units Packed Red blood cells. CT abdomen pelvis showed diffuse skeletal metastatic disease,  pathological right-sided rib fractures. Oncology following and treating in the setting of PSA greater than 3000. 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 Patient started on [place] and [place].   Code Status: Full code  Goals of Care/Recommendations: Continue to treat the treatable. Patient remaining hopeful for improvement/stability.  PT/OT recommendations for SNF rehab which patient is agreeable to. No bed offers at this time per [facility] notations PMT will continue to support and follow.    Prognosis: Unable to determine  Discharge Planning: To Be Determined with ongoing palliative support.   Thank you for allowing the Palliative Medicine Team to assist in the care of this patient.  Time Total: 25 min  Visit consisted of counseling and education dealing with the complex and emotionally intense issues of symptom management and palliative care in the setting of serious and potentially life-threatening illness.Greater than 50%  of this time was spent counseling and coordinating care related to the above assessment and plan.  Alda Lea, AGPCNP-BC  Palliative Medicine Team [phone]

## 2021-04-07 NOTE — Progress Notes (Signed)
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 TLSO recommended from nurosurgery and devliered to room.   OT comments  Pt. was agreeable to therapeutic Ex from bed level only today. Pt. Performed bilateral BUE there. Ex., strengthening with for shoulder flexion, abduction, diagonal abduction, elbow flexion, and extension. Pt. Initially started with yellow resistive theraband, and was upgraded to red resistive theraband. Pt. was provided with red theraband. Reviewed A/E with the pt., as well how to obtain the equipment per pt. Request. Pt. Continues to benefit from OT services for ADL training, A/E training, and pt. education about home modification, and DME. Pt. would benefit from SNF level of care upon discharge, with follow-up OT services.     Follow Up Recommendations  SNF     Equipment Recommendations   BSCommode   Recommendations for Other Services      Precautions / Restrictions Precautions Precautions: Fall Required Braces or Orthoses: Spinal Brace Spinal Brace: Thoracolumbosacral orthotic Restrictions Weight Bearing Restrictions: No       Mobility Bed Mobility     Deferred pt. request              Transfers       Deferred per pt. request              Balance                                           ADL either performed or assessed with clinical judgement   ADL Overall ADL's : Needs assistance/impaired                                        General ADL Comments: Mod-MAX A donning B socks at bed level. SETUP self-feeding bed level.     Vision Baseline Vision/History: No visual deficits Patient Visual Report: No change from baseline     Perception     Praxis      Cognition Arousal/Alertness: Awake/alert Behavior During Therapy: WFL for tasks assessed/performed Overall Cognitive Status: Within Functional Limits for tasks assessed                                          Exercises     Shoulder Instructions       General Comments      Pertinent Vitals/ Pain       Pain Assessment: No/denies pain  Home Living                                          Prior Functioning/Environment              Frequency  Min 1X/week  Progress Toward Goals  OT Goals(current goals can now be found in the care plan section)  Progress towards OT goals: Progressing toward goals  Acute Rehab OT Goals Patient Stated Goal: to get to a rehab facility for 3 months OT Goal Formulation: With patient Time For Goal Achievement: 04/16/21  Plan Discharge plan remains appropriate;Frequency needs to be updated    Co-evaluation      Reason for Co-Treatment: Complexity of the patient's impairments (multi-system involvement)   OT goals addressed during session: ADL's and self-care      AM-PAC OT "6 Clicks" Daily Activity     Outcome Measure   Help from another person eating meals?: A Little Help from another person taking care of personal grooming?: A Little Help from another person toileting, which includes using toliet, bedpan, or urinal?: A Lot Help from another person bathing (including washing, rinsing, drying)?: A Lot Help from another person to put on and taking off regular upper body clothing?: A Little Help from another person to put on and taking off regular lower body clothing?: A Lot 6 Click Score: 15    End of Session Equipment Utilized During Treatment: Gait  belt  OT Visit Diagnosis: Unsteadiness on feet (R26.81);Muscle weakness (generalized) (M62.81)   Activity Tolerance Patient limited by fatigue   Patient Left in bed;with call bell/phone within reach;with bed alarm set   Nurse Communication Other (comment)        Time: 1000-1020 OT Time Calculation (min): 20 min  Charges: OT General Charges $OT Visit: 1 Visit OT Treatments $Self Care/Home Management : 8-22 mins  Harrel Carina, MS, OTR/L    Harrel Carina 04/07/2021, 10:35 AM

## 2021-04-07 NOTE — Progress Notes (Signed)
PROGRESS NOTE    Taiven Greenley  TDD:[id number] DOB: 30-Nov-1960 DOA: 03/30/2021 PCP: Pcp, No   Follow-up on metastatic cancer. Brief Narrative:  Willie Plain is a 60 y.o. male with medical history significant for history of polysubstance abuse including THC, cocaine, alcohol, remote history of tobacco use, history of right proximal femur inter trochanteric fracture status post right IM nail intertrochanteric fixation in 04/02/2021, does not follow-up with a PCP, presents to the emergency department for chief concerns of right hip pain.  Pain is chronic for several months, denies any trauma, difficulty ambulation due to pain. He also endorses an unintentional 35 pound weight loss in the last year, denies bright red blood per rectum and melena. ED work-up, hypotensive, saturating well on room air, Hb 5.6, WBC 6.2, platelet 121, serum sodium 126, potassium 3.9, chloride 97, bicarb 17, BUN 9, serum creatinine of 0.64, nonfasting blood glucose 86.  eGFR is greater than 60. AST 51, ALT 8. Alk phos elevated at 884. Upon arriving the hospital, patient was found to have L5 compression fracture, L3/4 severe stenosis.  She was also found to have widespread bony metastasis  from prostate cancer.   Assessment & Plan:   Principal Problem:   Symptomatic anemia Active Problems:   Closed right hip fracture (HCC)   Hyponatremia   Closed rib fracture   Acute anemia   Thrombocytopenia (HCC)   Thoracic compression fracture, closed, initial encounter [facility])   Pathologic fracture of thoracic vertebrae, initial encounter   Malnourished ([place])   Protein-calorie malnutrition, severe ([place])   Cancer cachexia ([place])   Infestation by bed bug   Prostate cancer metastatic to bone ([place])    #1.  Prostate cancer with widespread bony metastasis. L5 compression fracture. 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 Condition stable, will be treated by oncology.  2.  Severe symptomatic anemia. Folic  acid deficiency. Thrombocytopenia. Condition stable.  #3.  Severe protein calorie malnutrition. Patient appetite seems to be improving.  5.  Hypokalemia. Hypomagnesemia Hypophosphatemia. Hypocalcemia. Hyponatremia. Given 2 g of magnesium sulfate.  Potassium slightly higher today, recheck level tomorrow. Continue sodium chloride 1 g twice a day for hyponatremia   DVT prophylaxis: Lovenox Code Status: Full Family Communication:  Disposition Plan:    Status is: Inpatient  Remains inpatient appropriate because:Unsafe d/c plan  Dispo: The patient is from: Home              Anticipated d/c is to: SNF              Patient currently is medically stable to d/c.   Difficult to place patient Yes        I/O last 3 completed shifts: In: 0  Out: 1950 [Urine:1950] Total I/O In: 77 [P.O.:237] Out: -      Consultants:  Oncology  Procedures: None  Antimicrobials: None  Subjective: Patient feels well, he has excellent appetite.  No nausea vomiting abdominal pain. He was able to walk at the bedside. Denies any short of breath or cough. No significant pain. No dysuria hematuria. No fever or chills.  Objective: Vitals:   04/06/21 1615 04/06/21 2029 04/07/21 0547 04/07/21 0844  BP: 112/71  105/69 100/61  Pulse: 63 67 71 73  Resp: $Remo'18 17 17 18  'ojbEY$ Temp:  97.9 F (36.6 C) 98 F (36.7 C) (!) 97.5 F (36.4 C)  TempSrc:  Oral    SpO2: 100% 100% 99% 100%  Weight:      Height:  Intake/Output Summary (Last 24 hours) at 04/07/2021 1041 Last data filed at 04/07/2021 1029 Gross per 24 hour  Intake 237 ml  Output 800 ml  Net -563 ml   Filed Weights   03/30/21 1030 04/01/21 1232  Weight: 59 kg 54.7 kg    Examination:  General exam: Appears severely malnourished Respiratory system: Clear to auscultation. Respiratory effort normal. Cardiovascular system: S1 & S2 heard, RRR. No JVD, murmurs, rubs, gallops or clicks. No pedal edema. Gastrointestinal system:  Abdomen is nondistended, soft and nontender. No organomegaly or masses felt. Normal bowel sounds heard. Central nervous system: Alert and oriented. No focal neurological deficits. Extremities: Symmetric 5 x 5 power. Skin: No rashes, lesions or ulcers Psychiatry: Judgement and insight appear normal. Mood & affect appropriate.     Data Reviewed: I have personally reviewed following labs and imaging studies  CBC: Recent Labs  Lab 04/03/21 0403 04/04/21 0505 04/05/21 0647 04/06/21 0700 04/07/21 0657  WBC 5.5 5.6 4.7 5.0 6.5  NEUTROABS 3.7 3.7 3.1 3.1 4.5  HGB 9.0* 9.2* 9.4* 9.3* 10.3*  HCT 27.3* 27.0* 27.7* 28.2* 30.6*  MCV 80.1 78.9* 78.7* 80.1 77.9*  PLT 115* 131* 153 173 924*   Basic Metabolic Panel: Recent Labs  Lab 04/02/21 0443 04/03/21 0403 04/04/21 0505 04/05/21 0647 04/06/21 0700 04/07/21 0657  NA 131* 128* 126* 128* 130* 126*  K 3.4* 4.4 4.2 4.4 4.6 5.2*  CL 107 103 102 104 105 98  CO2 20* 18* 19* 19* 19* 16*  GLUCOSE 93 99 93 84 108* 104*  BUN 5* 5* 6 5* 6 7  CREATININE 0.48* 0.47* 0.58* 0.41* 0.55* 0.54*  CALCIUM 6.4* 5.4* 5.2* 6.1* 7.3* 8.0*  MG 1.6* 1.5* 1.8 1.5* 1.5* 1.6*  PHOS 2.3* 1.9* 2.5 2.4* 2.7  --    GFR: Estimated Creatinine Clearance: 76.9 mL/min (A) (by C-G formula based on SCr of 0.54 mg/dL (L)). Liver Function Tests: Recent Labs  Lab 04/01/21 0719 04/02/21 0443 04/03/21 0403 04/04/21 0505  AST 45* 30 35  --   ALT $Re'6 6 6  'tOj$ --   ALKPHOS 593* 566* 504*  --   BILITOT 0.6 0.9 0.7  --   PROT 4.5* 4.9* 4.8*  --   ALBUMIN 1.9* 2.0* 2.0* 2.0*   No results for input(s): LIPASE, AMYLASE in the last 168 hours. No results for input(s): AMMONIA in the last 168 hours. Coagulation Profile: No results for input(s): INR, PROTIME in the last 168 hours. Cardiac Enzymes: No results for input(s): CKTOTAL, CKMB, CKMBINDEX, TROPONINI in the last 168 hours. BNP (last 3 results) No results for input(s): PROBNP in the last 8760 hours. HbA1C: No results  for input(s): HGBA1C in the last 72 hours. CBG: Recent Labs  Lab 04/01/21 1745  GLUCAP 108*   Lipid Profile: No results for input(s): CHOL, HDL, LDLCALC, TRIG, CHOLHDL, LDLDIRECT in the last 72 hours. Thyroid Function Tests: No results for input(s): TSH, T4TOTAL, FREET4, T3FREE, THYROIDAB in the last 72 hours. Anemia Panel: No results for input(s): VITAMINB12, FOLATE, FERRITIN, TIBC, IRON, RETICCTPCT in the last 72 hours. Sepsis Labs: No results for input(s): PROCALCITON, LATICACIDVEN in the last 168 hours.  Recent Results (from the past 240 hour(s))  Resp Panel by RT-PCR (Flu A&B, Covid) Nasopharyngeal Swab     Status: None   Collection Time: 03/30/21  1:37 PM   Specimen: Nasopharyngeal Swab; Nasopharyngeal(NP) swabs in vial transport medium  Result Value Ref Range Status   SARS Coronavirus 2 by RT PCR NEGATIVE NEGATIVE Final  Comment: (NOTE) SARS-CoV-2 target nucleic acids are NOT DETECTED.  The SARS-CoV-2 RNA is generally detectable in upper respiratory specimens during the acute phase of infection. The lowest concentration of SARS-CoV-2 viral copies this assay can detect is 138 copies/mL. A negative result does not preclude SARS-Cov-2 infection and should not be used as the sole basis for treatment or other patient management decisions. A negative result may occur with  improper specimen collection/handling, submission of specimen other than nasopharyngeal swab, presence of viral mutation(s) within the areas targeted by this assay, and inadequate number of viral copies(<138 copies/mL). A negative result must be combined with clinical observations, patient history, and epidemiological information. The expected result is Negative.  Fact Sheet for Patients:  EntrepreneurPulse.com.au  Fact Sheet for Healthcare Providers:  IncredibleEmployment.be  This test is no t yet approved or cleared by the [place] FDA and  has been  authorized for detection and/or diagnosis of SARS-CoV-2 by FDA under an Emergency Use Authorization (EUA). This EUA will remain  in effect (meaning this test can be used) for the duration of the COVID-19 declaration under Section 564(b)(1) of the Act, 21 U.S.C.section 360bbb-3(b)(1), unless the authorization is terminated  or revoked sooner.       Influenza A by PCR NEGATIVE NEGATIVE Final   Influenza B by PCR NEGATIVE NEGATIVE Final    Comment: (NOTE) The Xpert Xpress SARS-CoV-2/FLU/RSV plus assay is intended as an aid in the diagnosis of influenza from Nasopharyngeal swab specimens and should not be used as a sole basis for treatment. Nasal washings and aspirates are unacceptable for Xpert Xpress SARS-CoV-2/FLU/RSV testing.  Fact Sheet for Patients: EntrepreneurPulse.com.au  Fact Sheet for Healthcare Providers: IncredibleEmployment.be  This test is not yet approved or cleared by the [place] FDA and has been authorized for detection and/or diagnosis of SARS-CoV-2 by FDA under an Emergency Use Authorization (EUA). This EUA will remain in effect (meaning this test can be used) for the duration of the COVID-19 declaration under Section 564(b)(1) of the Act, 21 U.S.C. section 360bbb-3(b)(1), unless the authorization is terminated or revoked.  Performed at [facility], [address] [place], [place] [address]          Radiology Studies: No results found.      Scheduled Meds:  calcium carbonate  1 tablet Oral TID WC   enoxaparin (LOVENOX) injection  40 mg Subcutaneous Q24H   feeding supplement  237 mL Oral TID BM   ferrous sulfate  325 mg Oral Q breakfast   folic acid  1 mg Oral Daily   melatonin  5 mg Oral QHS   midodrine  10 mg Oral TID WC   multivitamin with minerals  1 tablet Oral Daily   polyethylene glycol  17 g Oral Daily   sodium chloride  1 g Oral BID WC   Vitamin D (Ergocalciferol)  50,000 Units Oral  Q7 days   Continuous Infusions:   LOS: 8 days    Time spent: 28 minutes    Sharen Hones, MD [facility]   To contact the attending provider between 7A-7P or the covering provider during after hours 7P-7A, please log into the web site [email or web address] and access using universal [place] password for that web site. If you do not have the password, please call the hospital operator.  04/07/2021, 10:41 AM

## 2021-04-08 LAB — CBC WITH DIFFERENTIAL/PLATELET
Abs Immature Granulocytes: 0.11 10*3/uL — ABNORMAL HIGH (ref 0.00–0.07)
Basophils Absolute: 0 10*3/uL (ref 0.0–0.1)
Basophils Relative: 0 %
Eosinophils Absolute: 0 10*3/uL (ref 0.0–0.5)
Eosinophils Relative: 1 %
HCT: 28.8 % — ABNORMAL LOW (ref 39.0–52.0)
Hemoglobin: 9.8 g/dL — ABNORMAL LOW (ref 13.0–17.0)
Immature Granulocytes: 2 %
Lymphocytes Relative: 26 %
Lymphs Abs: 1.3 10*3/uL (ref 0.7–4.0)
MCH: 26.8 pg (ref 26.0–34.0)
MCHC: 34 g/dL (ref 30.0–36.0)
MCV: 78.9 fL — ABNORMAL LOW (ref 80.0–100.0)
Monocytes Absolute: 0.6 10*3/uL (ref 0.1–1.0)
Monocytes Relative: 13 %
Neutro Abs: 2.8 10*3/uL (ref 1.7–7.7)
Neutrophils Relative %: 58 %
Platelets: 138 10*3/uL — ABNORMAL LOW (ref 150–400)
RBC: 3.65 MIL/uL — ABNORMAL LOW (ref 4.22–5.81)
RDW: 19.1 % — ABNORMAL HIGH (ref 11.5–15.5)
WBC: 4.9 10*3/uL (ref 4.0–10.5)
nRBC: 0 % (ref 0.0–0.2)

## 2021-04-08 LAB — BASIC METABOLIC PANEL
Anion gap: 7 (ref 5–15)
BUN: 7 mg/dL (ref 6–20)
CO2: 19 mmol/L — ABNORMAL LOW (ref 22–32)
Calcium: 7.2 mg/dL — ABNORMAL LOW (ref 8.9–10.3)
Chloride: 101 mmol/L (ref 98–111)
Creatinine, Ser: 0.39 mg/dL — ABNORMAL LOW (ref 0.61–1.24)
GFR, Estimated: >60 mL/min (ref >60)
Glucose, Bld: 100 mg/dL — ABNORMAL HIGH (ref 70–99)
Potassium: 4.6 mmol/L (ref 3.5–5.1)
Sodium: 127 mmol/L — ABNORMAL LOW (ref 135–145)

## 2021-04-08 LAB — MAGNESIUM: Magnesium: 1.7 mg/dL (ref 1.7–2.4)

## 2021-04-08 LAB — PHOSPHORUS: Phosphorus: 2.8 mg/dL (ref 2.5–4.6)

## 2021-04-08 MED ORDER — MAGNESIUM SULFATE 2 GM/50ML IV SOLN
2.0000 g | Freq: Once | INTRAVENOUS | Status: AC
  Administered 2021-04-08: 2 g via INTRAVENOUS
  Filled 2021-04-08: qty 50

## 2021-04-08 MED ORDER — MEGESTROL ACETATE 400 MG/10ML PO SUSP
400.0000 mg | Freq: Every day | ORAL | Status: DC
  Administered 2021-04-08 – 2021-05-02 (×16): 400 mg via ORAL
  Filled 2021-04-08 (×27): qty 10

## 2021-04-08 NOTE — Progress Notes (Signed)
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 Brief Narrative:  Ryan [name] is a 59 y.o. male with medical history significant for history of polysubstance abuse including THC, cocaine, alcohol, remote history of tobacco use, history of right proximal femur inter trochanteric fracture status post right IM nail intertrochanteric fixation in 04/02/2021, does not follow-up with a PCP, presents to the emergency department for chief concerns of right hip pain.  Pain is chronic for several months, denies any trauma, difficulty ambulation due to pain. He also endorses an unintentional 35 pound weight loss in the last year, denies bright red blood per rectum and melena. ED work-up, hypotensive, saturating well on room air, Hb 5.6, WBC 6.2, platelet 121, serum sodium 126, potassium 3.9, chloride 97, bicarb 17, BUN 9, serum creatinine of 0.64, nonfasting blood glucose 86.  eGFR is greater than 60. AST 51, ALT 8. Alk phos elevated at 884. Upon arriving the hospital, patient was found to have L5 compression fracture, L3/4 severe stenosis.  She was also found to have widespread bony metastasis  from prostate cancer.   Assessment & Plan:   Principal Problem:   Symptomatic anemia Active Problems:   Closed right hip fracture (HCC)   Hyponatremia   Closed rib fracture   Acute anemia   Thrombocytopenia (HCC)   Thoracic compression fracture, closed, initial encounter (HCC)   Pathologic fracture of thoracic vertebrae, initial encounter   Malnourished (HCC)   Protein-calorie malnutrition, severe (HCC)   Cancer cachexia (HCC)   Infestation by bed bug   Prostate cancer metastatic to bone (HCC)   Hypotension  #1.  Prostate cancer with widespread bony metastasis. L5 compression fracture. 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 Condition relatively stable, currently pending for nursing home  placement.  2.  Severe symptomatic anemia. Folic acid deficiency. Thrombocytopenia. Condition stable, normal iron B12 level.  Continue folic acid supplement.  3.  Severe protein calorie malnutrition. Discussed with RD, patient calorie intake is still not adequate.  We will monitor for now.  I will start megestrol to improve patient appetite.  4.  Hypokalemia. Hypomagnesemia Hypophosphatemia. Hypocalcemia. Hyponatremia. Magnesium 1.7, will give 2 g of mag sulfate. Continue salt tablets 1 g twice a day.    DVT prophylaxis: Lovenox Code Status: full Family Communication:  Disposition Plan:    Status is: Inpatient  Remains inpatient appropriate because:Inpatient level of care appropriate due to severity of illness  Dispo: The patient is from: Home              Anticipated d/c is to: SNF              Patient currently is medically stable to d/c.   Difficult to place patient Yes        I/O last 3 completed shifts: In: 237 [P.O.:237] Out: 1300 [Urine:1300] No intake/output data recorded.     Consultants:  Oncology  Procedures: None  Antimicrobials: None  Subjective: Patient is taking a nap when I see him.  But overall he feels good.  He has appetite, no nausea vomiting. No fever or chills. No short of breath or cough. No dysuria hematuria.  Objective: Vitals:   04/08/21 0500 04/08/21 0553 04/08/21 0744 04/08/21 1140  BP:  133/60 (!) 89/61 (!) 90/59  Pulse: 83 83 82 78  Resp: 17  16 15  Temp: 99 F (37.2 C)  99.2 F (37.3 C) 98.6 F (  37 C)  TempSrc: Oral   Oral  SpO2: 97%  98% 96%  Weight:      Height:        Intake/Output Summary (Last 24 hours) at 04/08/2021 1240 Last data filed at 04/08/2021 0559 Gross per 24 hour  Intake --  Output 900 ml  Net -900 ml   Filed Weights   03/30/21 1030 04/01/21 1232  Weight: 59 kg 54.7 kg    Examination:  General exam: Severely malnourished with cachexia. Respiratory system: Clear to auscultation.  Respiratory effort normal. Cardiovascular system: S1 & S2 heard, RRR. No JVD, murmurs, rubs, gallops or clicks. No pedal edema. Gastrointestinal system: Abdomen is nondistended, soft and nontender. No organomegaly or masses felt. Normal bowel sounds heard. Central nervous system: Alert and oriented. No focal neurological deficits. Extremities: Significant muscle atrophy Skin: No rashes, lesions or ulcers Psychiatry: Judgement and insight appear normal. Mood & affect appropriate.     Data Reviewed: I have personally reviewed following labs and imaging studies  CBC: Recent Labs  Lab 04/04/21 0505 04/05/21 0647 04/06/21 0700 04/07/21 0657 04/08/21 0624  WBC 5.6 4.7 5.0 6.5 4.9  NEUTROABS 3.7 3.1 3.1 4.5 2.8  HGB 9.2* 9.4* 9.3* 10.3* 9.8*  HCT 27.0* 27.7* 28.2* 30.6* 28.8*  MCV 78.9* 78.7* 80.1 77.9* 78.9*  PLT 131* 153 173 143* 735*   Basic Metabolic Panel: Recent Labs  Lab 04/03/21 0403 04/04/21 0505 04/05/21 0647 04/06/21 0700 04/07/21 0657 04/08/21 0624  NA 128* 126* 128* 130* 126* 127*  K 4.4 4.2 4.4 4.6 5.2* 4.6  CL 103 102 104 105 98 101  CO2 18* 19* 19* 19* 16* 19*  GLUCOSE 99 93 84 108* 104* 100*  BUN 5* 6 5* _0 CREATININE 0.47* 0.58* 0.41* 0.55* 0.54* 0.39*  CALCIUM 5.4* 5.2* 6.1* 7.3* 8.0* 7.2*  MG 1.5* 1.8 1.5* 1.5* 1.6* 1.7  PHOS 1.9* 2.5 2.4* 2.7  --  2.8   GFR: Estimated Creatinine Clearance: 76.9 mL/min (A) (by C-G formula based on SCr of 0.39 mg/dL (L)). Liver Function Tests: Recent Labs  Lab 04/02/21 0443 04/03/21 0403 04/04/21 0505  AST 30 35  --   ALT 6 6  --   ALKPHOS 566* 504*  --   BILITOT 0.9 0.7  --   PROT 4.9* 4.8*  --   ALBUMIN 2.0* 2.0* 2.0*   No results for input(s): LIPASE, AMYLASE in the last 168 hours. No results for input(s): AMMONIA in the last 168 hours. Coagulation Profile: No results for input(s): INR, PROTIME in the last 168 hours. Cardiac Enzymes: No results for input(s): CKTOTAL, CKMB, CKMBINDEX, TROPONINI in  the last 168 hours. BNP (last 3 results) No results for input(s): PROBNP in the last 8760 hours. HbA1C: No results for input(s): HGBA1C in the last 72 hours. CBG: Recent Labs  Lab 04/01/21 1745  GLUCAP 108*   Lipid Profile: No results for input(s): CHOL, HDL, LDLCALC, TRIG, CHOLHDL, LDLDIRECT in the last 72 hours. Thyroid Function Tests: No results for input(s): TSH, T4TOTAL, FREET4, T3FREE, THYROIDAB in the last 72 hours. Anemia Panel: No results for input(s): VITAMINB12, FOLATE, FERRITIN, TIBC, IRON, RETICCTPCT in the last 72 hours. Sepsis Labs: No results for input(s): PROCALCITON, LATICACIDVEN in the last 168 hours.  Recent Results (from the past 240 hour(s))  Resp Panel by RT-PCR (Flu A&B, Covid) Nasopharyngeal Swab     Status: None   Collection Time: 03/30/21  1:37 PM   Specimen: Nasopharyngeal Swab; Nasopharyngeal(NP) swabs in vial  transport medium  Result Value Ref Range Status   SARS Coronavirus 2 by RT PCR NEGATIVE NEGATIVE Final    Comment: (NOTE) SARS-CoV-2 target nucleic acids are NOT DETECTED.  The SARS-CoV-2 RNA is generally detectable in upper respiratory specimens during the acute phase of infection. The lowest concentration of SARS-CoV-2 viral copies this assay can detect is 138 copies/mL. A negative result does not preclude SARS-Cov-2 infection and should not be used as the sole basis for treatment or other patient management decisions. A negative result may occur with  improper specimen collection/handling, submission of specimen other than nasopharyngeal swab, presence of viral mutation(s) within the areas targeted by this assay, and inadequate number of viral copies(<138 copies/mL). A negative result must be combined with clinical observations, patient history, and epidemiological information. The expected result is Negative.  Fact Sheet for Patients:  https://www.fda.gov/media/152166/download  Fact Sheet for Healthcare Providers:   https://www.fda.gov/media/152162/download  This test is no t yet approved or cleared by the [place] FDA and  has been authorized for detection and/or diagnosis of SARS-CoV-2 by FDA under an Emergency Use Authorization (EUA). This EUA will remain  in effect (meaning this test can be used) for the duration of the COVID-19 declaration under Section 564(b)(1) of the Act, 21 U.S.C.section 360bbb-3(b)(1), unless the authorization is terminated  or revoked sooner.       Influenza A by PCR NEGATIVE NEGATIVE Final   Influenza B by PCR NEGATIVE NEGATIVE Final    Comment: (NOTE) The Xpert Xpress SARS-CoV-2/FLU/RSV plus assay is intended as an aid in the diagnosis of influenza from Nasopharyngeal swab specimens and should not be used as a sole basis for treatment. Nasal washings and aspirates are unacceptable for Xpert Xpress SARS-CoV-2/FLU/RSV testing.  Fact Sheet for Patients: https://www.fda.gov/media/152166/download  Fact Sheet for Healthcare Providers: https://www.fda.gov/media/152162/download  This test is not yet approved or cleared by the [place] FDA and has been authorized for detection and/or diagnosis of SARS-CoV-2 by FDA under an Emergency Use Authorization (EUA). This EUA will remain in effect (meaning this test can be used) for the duration of the COVID-19 declaration under Section 564(b)(1) of the Act, 21 U.S.C. section 360bbb-3(b)(1), unless the authorization is terminated or revoked.  Performed at [facility], [address] [place], [place] [address]          Radiology Studies: No results found.      Scheduled Meds:  calcium carbonate  1 tablet Oral TID WC   enoxaparin (LOVENOX) injection  40 mg Subcutaneous Q24H   feeding supplement  237 mL Oral TID BM   ferrous sulfate  325 mg Oral Q breakfast   folic acid  1 mg Oral Daily   melatonin  5 mg Oral QHS   midodrine  10 mg Oral TID WC   multivitamin with minerals  1 tablet Oral  Daily   polyethylene glycol  17 g Oral Daily   sodium chloride  1 g Oral BID WC   Vitamin D (Ergocalciferol)  50,000 Units Oral Q7 days   Continuous Infusions:   LOS: 9 days    Time spent: 24 minutes     , MD [facility]   To contact the attending provider between 7A-7P or the covering provider during after hours 7P-7A, please log into the web site [email or web address] and access using universal [place] password for that web site. If you do not have the password, please call the hospital operator.  04/08/2021, 12:40 PM    

## 2021-04-08 NOTE — Progress Notes (Signed)
PT Cancellation Note  Patient Details Name: Ryan [name] MRN: [id number] DOB: 03-Mar-1961   Cancelled Treatment:    Reason Eval/Treat Not Completed:  (Treatment session attempted.  Patient repeatedly declining participation with PT session due to various and multiple reasons.  Generally hyperverbal and difficult to redirect at times.  Will continue efforts next date.)  Johniece Hornbaker H. Owens Shark, PT, DPT, NCS 04/08/21, 5:24 PM [phone]

## 2021-04-09 NOTE — Progress Notes (Signed)
Updated patient Aunt on room/unit change.

## 2021-04-09 NOTE — Progress Notes (Signed)
PT Cancellation Note  Patient Details Name: Ryan [name] MRN: [id number] DOB: [birth year]   Cancelled Treatment:    Reason Eval/Treat Not Completed: Other (comment) (Pt declining therapy at this time in order to finish his lunch and take a nap. Pt very ademant on re-attempting PT and willing to participate tomorrow a.m.) PT will f/u with patient as available.   [name]. Fairly IV, PT, DPT Physical Therapist- [facility]  04/09/2021, 1:25 PM

## 2021-04-09 NOTE — Progress Notes (Signed)
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  INTERVAL HISTORY: Patient continues to have weakness and fatigue, but otherwise feels well.  He has an improved appetite and is actively eating lunch.  Apparently he has refused to work with physical therapy.  It was emphasized that he needs to continue to get stronger to be discharged from the hospital and to help treat his underlying malignancy.   He does not complain of pain currently.  Patient offers no further complaints.  Case discussed with his [place].  REVIEW OF SYSTEMS:   Review of Systems  Constitutional:  Positive for malaise/fatigue and weight loss. Negative for fever.  Respiratory: Negative.  Negative for cough, hemoptysis and shortness of breath.   Cardiovascular: Negative.  Negative for chest pain and leg swelling.  Gastrointestinal: Negative.  Negative for abdominal pain.  Genitourinary: Negative.  Negative for dysuria.  Musculoskeletal: Negative.  Negative for back pain.  Skin: Negative.  Negative for rash.  Neurological:  Positive for sensory change, focal weakness and weakness. Negative for dizziness and headaches.  Psychiatric/Behavioral: Negative.  The patient is not nervous/anxious.    As per HPI. Otherwise, a complete review of systems is negative.  PAST MEDICAL HISTORY: Past Medical History:  Diagnosis Date   Adenomatous colon polyp    Anemia    Depression    Elevated LFTs     PAST SURGICAL HISTORY: Past Surgical History:  Procedure Laterality Date   FRACTURE SURGERY     INTRAMEDULLARY (IM) NAIL INTERTROCHANTERIC Right 04/02/2020   Procedure: INTRAMEDULLARY (IM) NAIL INTERTROCHANTRIC;  Surgeon: Corky Mull, MD;  Location: [facility] ORS;  Service: Orthopedics;  Laterality: Right;    FAMILY  HISTORY: Family History  Problem Relation Age of Onset   Heart disease Other    Colon polyps Cousin        maternal   Colon polyps Cousin    Colon polyps Maternal Aunt    Colon cancer Maternal Grandfather     ADVANCED DIRECTIVES (Y/N):  _0 @  HEALTH MAINTENANCE: Social History   Tobacco Use   Smoking status: Some Days    Pack years: 0.00   Smokeless tobacco: Never  Substance Use Topics   Alcohol use: Yes    Comment: 4 beers weekly   Drug use: No     Colonoscopy:  PAP:  Bone density:  Lipid panel:  No Known Allergies  Current Facility-Administered Medications  Medication Dose Route Frequency Provider Last Rate Last Admin   bisacodyl (DULCOLAX) EC tablet 5 mg  5 mg Oral Daily PRN Val Riles, MD       bisacodyl (DULCOLAX) suppository 10 mg  10 mg Rectal Daily PRN Val Riles, MD       calcium carbonate (OS-CAL - dosed in mg of elemental calcium) tablet 500 mg of elemental calcium  1 tablet Oral TID WC Mansy, Jan A, MD   500 mg of elemental calcium at 04/09/21 1247   enoxaparin (LOVENOX) injection 40 mg  40 mg Subcutaneous Q24H Cox, Amy N, DO   40 mg at 04/06/21 2118   feeding supplement (ENSURE ENLIVE / ENSURE PLUS) liquid 237 mL  237 mL Oral TID BM Val Riles, MD   237 mL at 04/08/21 1303   ferrous sulfate tablet 325 mg  325 mg Oral Q breakfast Cox, Amy  N, DO   325 mg at 78/24/23 5361   folic acid (FOLVITE) tablet 1 mg  1 mg Oral Daily Cox, Amy N, DO   1 mg at 04/09/21 1001   guaiFENesin-dextromethorphan (ROBITUSSIN DM) 100-10 MG/5ML syrup 5 mL  5 mL Oral Q4H PRN Val Riles, MD   5 mL at 04/09/21 0058   hydrOXYzine (ATARAX/VISTARIL) tablet 25 mg  25 mg Oral TID PRN Val Riles, MD   25 mg at 04/07/21 2029   megestrol (MEGACE) 400 MG/10ML suspension 400 mg  400 mg Oral Daily Sharen Hones, MD   400 mg at 04/09/21 4431   melatonin tablet 5 mg  5 mg Oral QHS Val Riles, MD   5 mg at 04/07/21 2029   midodrine (PROAMATINE) tablet 10 mg  10 mg Oral TID WC  Val Riles, MD   10 mg at 04/09/21 1247   multivitamin with minerals tablet 1 tablet  1 tablet Oral Daily Cox, Amy N, DO   1 tablet at 04/09/21 1001   ondansetron (ZOFRAN) tablet 4 mg  4 mg Oral Q6H PRN Cox, Amy N, DO       Or   ondansetron (ZOFRAN) injection 4 mg  4 mg Intravenous Q6H PRN Cox, Amy N, DO       oxyCODONE (Oxy IR/ROXICODONE) immediate release tablet 5 mg  5 mg Oral Q6H PRN Val Riles, MD   5 mg at 04/09/21 0959   phenol (CHLORASEPTIC) mouth spray 1 spray  1 spray Mouth/Throat PRN Val Riles, MD   1 spray at 04/02/21 0155   polyethylene glycol (MIRALAX / GLYCOLAX) packet 17 g  17 g Oral Daily Val Riles, MD   17 g at 04/07/21 1029   sodium chloride tablet 1 g  1 g Oral BID WC Sharen Hones, MD   1 g at 04/09/21 0959   traZODone (DESYREL) tablet 50 mg  50 mg Oral QHS PRN Val Riles, MD   50 mg at 04/09/21 0058   Vitamin D (Ergocalciferol) (DRISDOL) capsule 50,000 Units  50,000 Units Oral Q7 days Val Riles, MD   50,000 Units at 04/07/21 1759    OBJECTIVE: Vitals:   04/09/21 0826 04/09/21 1113  BP: 102/64 (!) 87/51  Pulse: 79 85  Resp: 17 16  Temp: 98.4 F (36.9 C) 99.1 F (37.3 C)  SpO2: 100%      Body mass index is 10.88 kg/m.    ECOG FS:3 - Symptomatic, >50% confined to bed  General: Thin, no acute distress. Eyes: Pink conjunctiva, anicteric sclera. HEENT: Normocephalic, moist mucous membranes. Lungs: No audible wheezing or coughing. Heart: Regular rate and rhythm. Abdomen: Soft, nontender, no obvious distention. Musculoskeletal: No edema, cyanosis, or clubbing. Neuro: Alert, answering all questions appropriately. Cranial nerves grossly intact. Skin: No rashes or petechiae noted. Psych: Normal affect.   LAB RESULTS:  Lab Results  Component Value Date   NA 127 (L) 04/08/2021   K 4.6 04/08/2021   CL 101 04/08/2021   CO2 19 (L) 04/08/2021   GLUCOSE 100 (H) 04/08/2021   BUN 7 04/08/2021   CREATININE 0.39 (L) 04/08/2021   CALCIUM 7.2 (L)  04/08/2021   PROT 4.8 (L) 04/03/2021   ALBUMIN 2.0 (L) 04/04/2021   AST 35 04/03/2021   ALT 6 04/03/2021   ALKPHOS 504 (H) 04/03/2021   BILITOT 0.7 04/03/2021   GFRNONAA >60 04/08/2021   GFRAA >60 04/06/2020    Lab Results  Component Value Date   WBC 4.9 04/08/2021   NEUTROABS 2.8  04/08/2021   HGB 9.8 (L) 04/08/2021   HCT 28.8 (L) 04/08/2021   MCV 78.9 (L) 04/08/2021   PLT 138 (L) 04/08/2021     STUDIES: DG Chest 2 View  Result Date: 03/30/2021 CLINICAL DATA:  Post hip surgery EXAM: CHEST - 2 VIEW COMPARISON:  June 2021 FINDINGS: No consolidation or edema. No pleural effusion. No pneumothorax. Normal heart size. There is a diffusely abnormal appearance to included osseous structures with a mottled appearance. There are multiple thoracic compression fractures. IMPRESSION: Diffusely abnormal appearance of osseous structures concerning for a marrow infiltrative process. Multiple thoracic compression fractures are likely pathologic. Electronically Signed   By: [name] M.D.   On: 03/30/2021 12:04   CT HEAD WO CONTRAST  Result Date: 04/01/2021 CLINICAL DATA:  Right foot weakness EXAM: CT HEAD WITHOUT CONTRAST TECHNIQUE: Contiguous axial images were obtained from the base of the skull through the vertex without intravenous contrast. COMPARISON:  04/01/2020.  Findings of osseous metastatic disease FINDINGS: Brain: No evidence of acute infarction, edema hemorrhage, hydrocephalus, extra-axial collection or mass lesion/mass effect. Mild age-related cerebral volume loss. Vascular: No hyperdense vessel or unexpected calcification. Skull: Interval development of diffusely heterogeneous appearance of the bony calvarium and skull base compatible with diffuse osseous metastatic disease. No evidence of pathologic calvarial fracture. Sinuses/Orbits: Small amount of mucosal debris within the bilateral frontal sinuses. Other: None. IMPRESSION: 1. No acute intracranial findings. 2. Interval development  of diffusely heterogeneous appearance of the bony calvarium and skull base compatible with diffuse osseous metastatic disease. No evidence of pathologic calvarial fracture. Electronically Signed   By: Davina Poke D.O.   On: 04/01/2021 15:09   CT LUMBAR SPINE WO CONTRAST  Result Date: 04/01/2021 CLINICAL DATA:  Low back pain, trauma; L5 fracture. EXAM: CT LUMBAR SPINE WITHOUT CONTRAST TECHNIQUE: Multidetector CT imaging of the lumbar spine was performed without intravenous contrast administration. Multiplanar CT image reconstructions were also generated. COMPARISON:  CT chest/abdomen/pelvis 03/30/2021. FINDINGS: Segmentation: Transitional lumbosacral anatomy. In correlating with the prior CT chest/abdomen/pelvis of 03/30/2021, the S1 vertebra is transitional and partly lumbarized. Well-formed S1-S2 intervertebral disc. Left-sided S1-S2 assimilation joint. Alignment: Trace L1-L2 grade 1 retrolisthesis. Trace L3-L4 grade 1 anterolisthesis. Trace L4-L5 grade 1 retrolisthesis. Trace L5-S1 grade 1 retrolisthesis. Vertebrae: Diffuse osseous heterogeneity compatible with diffuse osseous metastatic disease. Mild age-indeterminate L5 inferior endplate compression deformity (for instance as seen on series 8, image 28) (series 6, image 42). There are defects within the L1 superior endplate, L3 superior endplate and L5 superior endplate which appear to reflect Schmorl nodes. Elsewhere, no acute lumbar spine fracture is identified. Paraspinal and other soft tissues: Please refer to the recent prior CT chest/abdomen/pelvis for description of abdominopelvic soft tissue findings. Paraspinal soft tissues within normal limits. Disc levels: No more than mild disc degeneration at any level. L1-L2: Disc bulge. Facet arthrosis. No appreciable significant spinal canal stenosis or neural foraminal narrowing. L2-L3: Disc bulge. Superimposed broad-based left foraminal/extraforaminal disc protrusion. Facet arthrosis. Mild left greater  than right subarticular and central canal narrowing. Mild left neural foraminal narrowing. L3-L4: Disc bulge asymmetric to the left. Superimposed broad-based left foraminal/extraforaminal disc protrusion. Facet arthrosis and ligamentum flavum hypertrophy. Suspect severe central canal stenosis with significant bilateral subarticular narrowing. Moderate bilateral neural foraminal narrowing. L4-L5: Disc bulge. Facet arthrosis/ligamentum flavum hypertrophy. Suspect moderate central canal stenosis. Bilateral subarticular narrowing. Mild bilateral neural foraminal narrowing. L5-S1: Disc bulge with endplate spurring. Facet arthrosis and ligamentum flavum hypertrophy. Bilateral subarticular narrowing without appreciable central canal stenosis. Bilateral neural foraminal  narrowing (mild right, mild/moderate left). S1-S2: Facet arthrosis. No appreciable significant disc herniation, spinal canal stenosis or neural foraminal narrowing. 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 Mild age-indeterminate L5 inferior endplate compression deformity, unchanged as compared to the CT abdomen/pelvis of 03/30/2021. Elsewhere, there is no evidence of acute fracture to the lumbar spine. Redemonstrated diffuse osseous heterogeneity compatible with diffuse osseous metastatic disease. Defects within the L1, L3 and L5 superior endplates with an appearance most suggestive of Schmorl nodes. Lumbar spondylosis, as outlined. Severe spinal canal stenosis is suspected at L3-L4. Levels of lesser spinal canal stenosis, as described. Sites of mild and moderate foraminal stenosis, as detailed. Trace multilevel spondylolisthesis. Electronically Signed   By: Kellie Simmering DO   On: 04/01/2021 15:07   MR BRAIN W WO CONTRAST  Result Date: 04/03/2021 CLINICAL DATA:  Metastatic disease evaluation. EXAM: MRI HEAD WITHOUT AND WITH CONTRAST TECHNIQUE: Multiplanar, multiecho pulse sequences of the brain and  surrounding structures were obtained without and with intravenous contrast. CONTRAST:  7.12m GADAVIST GADOBUTROL 1 MMOL/ML IV SOLN COMPARISON:  CT head April 01, 2021. FINDINGS: Brain: Punctate nonenhancing focus of restricted diffusion in the high right juxtacortical frontal lobe (series 2, image 38). Additional punctate focus of DWI hyperintensity in the region left trigeminal nerve (series 2, image 16). No hydrocephalus. No abnormal mass effect. No extra-axial fluid collection. No acute hemorrhage. Generalized atrophy with ex vacuo ventricular dilation. Vascular: Major arterial flow voids are maintained at the skull base. Skull and upper cervical spine: Diffuse abnormal calvarial and skull base signal with patchy enhancement, compatible with diffuse osseous metastatic disease. Sinuses/Orbits: Atelectatic right maxillary sinus with mild right maxillary mucosal thickening. Mild right frontoethmoidal mucosal thickening. Other: Large left and moderate right mastoid effusions. IMPRESSION: 1. Punctate nonenhancing focus of restricted diffusion in the high right juxtacortical frontal lobe. This most likely represents a small acute infarct; however, early nonenhancing metastatic lesion is not excluded and follow-up MRI with contrast in approximately 4 weeks is recommended to ensure resolution. 2. Additional punctate focus of DWI hyperintensity in the region of the cisternal left trigeminal nerve without enhancement. While potentially artifactual, this finding also warrants attention on the recommended short interval follow-up MRI. 3. 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 4. Large left and moderate right mastoid effusions. 5. Mild right ostiomeatal unit pattern paranasal sinus mucosal thickening. Electronically Signed   By: FMargaretha SheffieldMD   On: 04/03/2021 15:55   CT CHEST ABDOMEN PELVIS W CONTRAST  Result Date: 03/30/2021 CLINICAL DATA:  Unintended weight loss in a 60year old male,  abnormal x-ray. EXAM: CT CHEST, ABDOMEN, AND PELVIS WITH CONTRAST TECHNIQUE: Multidetector CT imaging of the chest, abdomen and pelvis was performed following the standard protocol during bolus administration of intravenous contrast. CONTRAST:  1040mOMNIPAQUE IOHEXOL 300 MG/ML  SOLN COMPARISON:  Previous radiographs of the hip and prior neuro imaging, no cross-sectional imaging of the chest, abdomen or pelvis is available for review. FINDINGS: CT CHEST FINDINGS Cardiovascular: Normal caliber of the thoracic aorta. Normal heart size. Normal caliber central pulmonary vessels. Mediastinum/Nodes: No discrete adenopathy in the mediastinum. Soft tissue about the upper thoracic spine, see below. Esophagus mildly patulous. No axillary lymphadenopathy. No thoracic inlet lymphadenopathy. Lungs/Pleura: No suspicious pulmonary mass. No consolidation or sign of pleural effusion. Airways are patent. Musculoskeletal: Diffuse sclerotic metastatic disease with areas of bony destruction, sclerosis mixed with lytic changes. Soft tissue about the T3 level with mild loss of height at T3. Thickness  of soft tissue approximately 11 mm to the LEFT of the vertebral body measuring 3.3 cm in greatest axial dimension. To a lesser extent there is soft tissue on the contralateral side along the medial RIGHT posterior mediastinum. Multiple rib fractures of varying ages with limited callus formation at most sites but with subacute features and or chronic features throughout the RIGHT chest. Fractures of ribs 3, 4, 5, 6, 7, 8, 9, 10 and 11 posteriorly. Sternum is diffusely involved as are all others skeletal structures within the visualized axial and appendicular skeleton but is intact otherwise. CT ABDOMEN PELVIS FINDINGS Hepatobiliary: No focal, suspicious hepatic lesion. No biliary duct dilation. Pancreas: Normal, without mass, inflammation or ductal dilatation. Spleen: Spleen normal size and contour. Adrenals/Urinary Tract: Adrenal glands are  normal. Symmetric renal enhancement. No hydronephrosis. Smooth contour of the urinary bladder. Stomach/Bowel: No acute gastrointestinal findings. Normal appendix gas filled in the RIGHT lower quadrant. Scattered gas-filled loops of small bowel throughout the abdomen. Vascular/Lymphatic: Patent abdominal vessels. No aneurysmal dilation. There is no gastrohepatic or hepatoduodenal ligament lymphadenopathy. No retroperitoneal or mesenteric lymphadenopathy. No pelvic sidewall lymphadenopathy. Reproductive: Prostate is un enlarged not well evaluated. Other: Hazy infiltration of LEFT upper quadrant fat and omentum without discrete measurable abnormality. Small volume ascites. No free air. Musculoskeletal: Diffuse skeletal metastatic disease with pathologic fractures of RIGHT-sided ribs, likely T3 vertebral body, also loss of height in the lower thoracic spine at T8 through T11 and subacute or chronic spinous process fracture at C7. L3 compression of superior endplate. Callus formation about a RIGHT femoral fracture of the proximal femur post ORIF. IMPRESSION: 1. Diffuse skeletal metastatic disease with signs of pathologic fracture of multiple RIGHT-sided ribs which appears subacute. 2. Loss of height at multiple levels in the spine likely related to pathologic fracture more likely subacute or chronic. Soft tissue about the upper thoracic spine where there is loss of height of T3 has an appearance more suggestive of metastatic disease than adjacent hematoma. Correlate with any pain in this location. Consider spinal MRI for further assessment as warranted. 3. No discrete site of primary neoplasm is visualized. Prostate is considered based on the appearance of bony sclerosis and diffuse nature of above findings. Correlation with PSA could potentially be helpful as an initial means of further evaluating this patient. 4. Spleen is normal size and there is no sign of adenopathy 5. Subtle infiltration of omental fat may be  indicative of early peritoneal disease, attention on follow-up. 6. Small volume ascites. 7. Callus formation about a RIGHT femoral fracture of the proximal femur post ORIF. These results will be called to the ordering clinician or representative by the Radiologist Assistant, and communication documented in the PACS or [employer]. Electronically Signed   By: Zetta Bills M.D.   On: 03/30/2021 16:55   MR TOTAL SPINE METS SCREENING  Result Date: 04/03/2021 CLINICAL DATA:  Metastatic disease evaluation. EXAM: MRI TOTAL SPINE WITHOUT AND WITH CONTRAST TECHNIQUE: Multisequence MR imaging of the spine from the cervical spine to the sacrum was performed prior to and following IV contrast administration for evaluation of spinal metastatic disease. CONTRAST:  7.64m GADAVIST GADOBUTROL 1 MMOL/ML IV SOLN COMPARISON:  CT chest/abdomen/pelvis March 30, 2021. CT lumbar spine April 01, 2021. FINDINGS: MRI CERVICAL SPINE FINDINGS Alignment: No substantial sagittal subluxation. Vertebrae: Diffuse heterogeneity of the bone marrow with heterogeneous/patchy enhancement, compatible with diffuse metastatic disease. No linear marrow edema or vertebral body height loss to suggest acute fracture. Cord: Motion limited evaluation without convincing cord  signal abnormality. Postcontrast imaging is limited by motion without obvious evidence of epidural extension of tumor. Posterior Fossa, vertebral arteries, paraspinal tissues: The visualized vertebral artery flow voids are maintained. Posterior fossa is better assessed on same day MRI head. Disc levels: Severe multilevel degenerative disc disease in the cervical spine. There is mild to moderate canal stenosis at C3-C4 and severe canal stenosis at C4-C5 and C5-C6. Likely severe bilateral foraminal stenosis at C3-C4, C4-C5, C5-C6, and C6-C7. MRI THORACIC SPINE FINDINGS Alignment:  Normal. Vertebrae: Diffuse heterogeneity of the bone marrow with heterogeneous/patchy enhancement,  compatible with diffuse metastatic disease. Mild height loss of the T3 and T8 through T11 vertebral bodies, suggestive pathologic fractures that may be subacute or chronic given no convincing marrow edema. Cord:  No evidence of abnormal cord signal. Paraspinal and other soft tissues: Enhancing tumor extends into the ventral and left lateral paraspinal soft tissues from T2 to T4-T5 and into the epidural space at T2-T3 to involve the left subarticular recess and left T2-T3 foramen (see series 38, image 4). Resulting left T2-T3 foraminal narrowing without significant canal stenosis. Motion limited evaluation without any other sites of paraspinal/epidural tumor extension.Please see recent CT chest abdomen and pelvis for extra-spinal evaluation. Disc levels: Posterior disc bulges on the left at T9-T10, T10-T11 which partially efface ventral CSF with mild canal stenosis at these levels. MRI LUMBAR SPINE FINDINGS Segmentation:  Transitional lumbosacral anatomy with lumbarized S1. Alignment:  No substantial sagittal subluxation. Vertebrae: Deformity of the inferior L5 endplate, similar to prior CT chest abdomen/pelvis. Otherwise, lumbar vertebral body heights are maintained with scattered degenerative Schmorl's nodes. Conus medullaris: Extends to the L1-L2 level and appears normal. No evidence of abnormal enhancement of the conus or cauda equina nerve roots. No evidence of epidural tumor involvement. Paraspinal and other soft tissues: No paraspinal extension of tumor identified. Please see recent CT chest abdomen and pelvis for extra-spinal evaluation. Disc levels: Posterior disc bulges at L3-L4 and L4-L5 with mild canal and bilateral subarticular recess stenosis at these levels. Multilevel mild foraminal stenosis. IMPRESSION: 1. Extensive diffuse osseous metastatic disease throughout the cervical, thoracic and lumbar spine. 2. Enhancing tumor extends into the ventral and left lateral paraspinal soft tissues from T2 to  T4-T5 and into the epidural space at T2-T3 to involve the left subarticular recess and left T2-T3 foramen (see series 38, image 4). Resulting left T2-T3 foraminal narrowing without significant canal stenosis. Motion limited evaluation without any other sites of paraspinal/epidural tumor identified. 3. Mild height loss of the T3 and T8 through T11 vertebral bodies and deformity of the inferior L5 endplate, similar to prior CT chest/abdomen/pelvis and suggestive of pathologic fractures that may be subacute or chronic given no convincing marrow edema. 4. Severe multilevel degenerative change of the cervical spine. Severe canal stenosis C4-C5 and C5-C6. Likely severe bilateral foraminal stenosis at C3-C4, C4-C5, C5-C6, and C6-C7. Dedicated MRI of the cervical spine could further characterize if clinically indicated. 5. Small disc bulges at T9-T10, T10-T11, L3-L4, and L4-L5 with mild canal stenosis at these levels. 6. Please see recent CT chest/abdomen/pelvis for extra-spinal valuation. Electronically Signed   By: Margaretha Sheffield MD   On: 04/03/2021 16:35   DG Hip Unilat W or Wo Pelvis 2-3 Views Right  Result Date: 03/30/2021 CLINICAL DATA:  Right hip pain.  ORIF. EXAM: DG HIP (WITH OR WITHOUT PELVIS) 2-3V RIGHT COMPARISON:  04/02/2020. FINDINGS: Diffuse severe osteopenia with diffuse mottled lucencies noted throughout the bony structures of the visualized lumbar spine and pelvis. A  process such as myeloma or metastatic disease cannot be excluded. What appears to be at L5 compression fracture noted. Lumbar spine series can be obtained for confirmation. No other acute bony abnormality identified. Postsurgical changes about the right hip. Hardware intact. Anatomic alignment. IMPRESSION: Diffuse severe osteopenia with diffuse mild wall lucencies noted throughout the bony structures of the visualized lumbar spine and pelvis. A process such as myeloma or metastatic disease cannot be excluded. 2. What appears to be a L5  compression fracture noted. Lumbar spine series can be obtained for confirmation. No other acute bony abnormality identified. 3. Postsurgical changes right hip. Hardware intact. 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 CT scan results reviewed independently and reported as above with widespread bony disease, but no obvious visceral lesions.  Patient's PSA is greater than 3000.  Currently, there is no benefit in doing a biopsy.  Molecular studies may be necessary, but these are difficult to accurately obtain off a bone biopsy.  Can consider prostate biopsy in the future.  Bone marrow biopsy is also a consideration to assess for marrow infiltration, but is not necessary at this point.  Patient received Zometa for his bony disease and loading dose 240 mg on approximately April 02, 2021.  His next dose will not be needed until the end of July which can be accomplished as an outpatient in the cancer center.  Severe anemia: Improved with blood transfusion: Patient only has mildly decreased iron stores as well as a decreased folate level.  B12 is within normal limits.  Given the extent of patient's bony disease, he may have marrow infiltration.  Continue to maintain hemoglobin greater than 8.0. Thrombocytopenia: Improved. Hypocalcemia: Given patient's decreased albumin, calcium levels corrected to near normal.  Patient recently received Zometa as well. Substance abuse: Patient urine drug screen positive for cocaine.  He expressed interest in entering inpatient rehab. Hypomagnesia: Resolved. Disposition: Patient will likely need rehab upon discharge.  Will arrange follow-up in the [place] at the end of July for further evaluation, hospital follow-up, and continuation of treatment for his prostate cancer.  Will follow.  Lloyd Huger, MD    04/09/2021 2:21 PM

## 2021-04-09 NOTE — Progress Notes (Signed)
PROGRESS NOTE    Ryan [name]  KGY:[id number] DOB: 1961-08-18 DOA: 03/30/2021 PCP: Pcp, No   Follow-up on metastatic prostate cancer. Brief Narrative:  Ryan [name] is a 60 y.o. male with medical history significant for history of polysubstance abuse including THC, cocaine, alcohol, remote history of tobacco use, history of right proximal femur inter trochanteric fracture status post right IM nail intertrochanteric fixation in 04/02/2021, does not follow-up with a PCP, presents to the emergency department for chief concerns of right hip pain.  Pain is chronic for several months, denies any trauma, difficulty ambulation due to pain. He also endorses an unintentional 35 pound weight loss in the last year, denies bright red blood per rectum and melena. ED work-up, hypotensive, saturating well on room air, Hb 5.6, WBC 6.2, platelet 121, serum sodium 126, potassium 3.9, chloride 97, bicarb 17, BUN 9, serum creatinine of 0.64, nonfasting blood glucose 86.  eGFR is greater than 60. AST 51, ALT 8. Alk phos elevated at 884. Upon arriving the hospital, patient was found to have L5 compression fracture, L3/4 severe stenosis.  She was also found to have widespread bony metastasis  from prostate cancer.     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  Prostate cancer with widespread bony metastasis. L5 compression fracture. 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 Condition stable, followed by oncology.   2.  Severe symptomatic  anemia. Folic acid deficiency. Thrombocytopenia. Condition relatively stable.  #3.  Severe protein, nutrition. Continue Megace to improve appetite.    4.  Hypokalemia. Hypomagnesemia Hypophosphatemia. Hypocalcemia. Hyponatremia. Recheck labs tomorrow.   DVT prophylaxis: Lovenox Code Status: full Family Communication:  Disposition Plan:    Status is: Inpatient  Remains inpatient appropriate because:Inpatient level of care appropriate due to severity of illness  Dispo: The patient is from: Home              Anticipated d/c is to: SNF              Patient currently is medically stable to d/c.   Difficult to place patient Yes        I/O last 3 completed shifts: In: -  Out: 1850 [Urine:1850] No intake/output data recorded.     Consultants:  None  Procedures: None  Antimicrobials: None  Subjective: Patient states that appetite is improving.  No nausea vomiting. No abdominal pain or diarrhea. No short of breath or cough. No fever chills No dysuria hematuria.  Objective: Vitals:   04/09/21 0000 04/09/21 0512 04/09/21 0826 04/09/21 1113  BP: (!) 101/58 104/67 102/64 (!) 87/51  Pulse: 73 80 79 85  Resp: _0 Temp: 98.9 F (37.2 C) 97.7 F (36.5 C) 98.4 F (36.9 C) 99.1 F (37.3 C)  TempSrc: Oral Oral Oral Oral  SpO2: 100% 100% 100%   Weight:      Height:        Intake/Output Summary (Last 24 hours) at 04/09/2021 1443 Last data filed at 04/09/2021 0527 Gross per 24 hour  Intake --  Output 950 ml  Net -950 ml   Filed Weights   03/30/21 1030 04/01/21 1232 04/08/21 0800  Weight: 59 kg 54.7 kg 41.6 kg    Examination:  General exam: Ill-appearing, severely malnourished. Respiratory system: Clear to auscultation. Respiratory effort normal. Cardiovascular system: S1 & S2 heard, RRR. No JVD, murmurs, rubs, gallops or clicks. No pedal edema. Gastrointestinal system: Abdomen is nondistended, soft and nontender. No organomegaly or masses felt.  Normal bowel sounds heard. Central nervous system: Alert and oriented x3. No focal neurological deficits. Extremities: Severe muscle atrophy. Skin: No rashes, lesions or ulcers Psychiatry: Judgement and insight appear normal. Mood & affect appropriate.     Data Reviewed: I have personally reviewed following labs and imaging studies  CBC: Recent Labs  Lab 04/04/21 0505 04/05/21 0647 04/06/21 0700 04/07/21 0657 04/08/21 0624  WBC 5.6 4.7 5.0 6.5 4.9  NEUTROABS 3.7 3.1 3.1 4.5 2.8  HGB 9.2* 9.4* 9.3* 10.3* 9.8*  HCT 27.0* 27.7* 28.2* 30.6* 28.8*  MCV 78.9* 78.7* 80.1 77.9* 78.9*  PLT 131* 153 173 143* 628*   Basic Metabolic Panel: Recent Labs  Lab 04/03/21 0403 04/04/21 0505 04/05/21 0647 04/06/21 0700 04/07/21 0657 04/08/21 0624  NA 128* 126* 128* 130* 126* 127*  K 4.4 4.2 4.4 4.6 5.2* 4.6  CL 103 102 104 105 98 101  CO2 18* 19* 19* 19* 16* 19*  GLUCOSE 99 93 84 108* 104* 100*  BUN 5* 6 5* _0 CREATININE 0.47* 0.58* 0.41* 0.55* 0.54* 0.39*  CALCIUM 5.4* 5.2* 6.1* 7.3* 8.0* 7.2*  MG 1.5* 1.8 1.5* 1.5* 1.6* 1.7  PHOS 1.9* 2.5 2.4* 2.7  --  2.8   GFR: Estimated Creatinine Clearance: 58.5 mL/min (A) (by C-G formula based on SCr of 0.39 mg/dL (L)). Liver Function Tests: Recent Labs  Lab 04/03/21 0403 04/04/21 0505  AST 35  --   ALT 6  --   ALKPHOS 504*  --   BILITOT 0.7  --   PROT 4.8*  --   ALBUMIN 2.0* 2.0*   No results for input(s): LIPASE, AMYLASE in the last 168 hours. No results for input(s): AMMONIA in the last 168 hours. Coagulation Profile: No results for input(s): INR, PROTIME in the last 168 hours. Cardiac Enzymes: No results for input(s): CKTOTAL, CKMB, CKMBINDEX, TROPONINI in the last 168 hours. BNP (last 3 results) No results for input(s): PROBNP in the last 8760 hours. HbA1C: No results for input(s): HGBA1C in the last 72 hours. CBG: No results for input(s): GLUCAP in the last 168 hours. Lipid Profile: No results for input(s): CHOL,  HDL, LDLCALC, TRIG, CHOLHDL, LDLDIRECT in the last 72 hours. Thyroid Function Tests: No results for input(s): TSH, T4TOTAL, FREET4, T3FREE, THYROIDAB in the last 72 hours. Anemia Panel: No results for input(s): VITAMINB12, FOLATE, FERRITIN, TIBC, IRON, RETICCTPCT in the last 72 hours. Sepsis Labs: No results for input(s): PROCALCITON, LATICACIDVEN in the last 168 hours.  No results found for this or any previous visit (from the past 240 hour(s)).       Radiology Studies: No results found.      Scheduled Meds:  calcium carbonate  1 tablet Oral TID WC   enoxaparin (LOVENOX) injection  40 mg Subcutaneous Q24H   feeding supplement  237 mL Oral TID BM   ferrous sulfate  325 mg Oral Q breakfast   folic acid  1 mg Oral Daily   megestrol  400 mg Oral Daily   melatonin  5  mg Oral QHS   midodrine  10 mg Oral TID WC   multivitamin with minerals  1 tablet Oral Daily   polyethylene glycol  17 g Oral Daily   sodium chloride  1 g Oral BID WC   Vitamin D (Ergocalciferol)  50,000 Units Oral Q7 days   Continuous Infusions:   LOS: 10 days    Time spent: 28 minutes    Sharen Hones, MD [facility]   To contact the attending provider between 7A-7P or the covering provider during after hours 7P-7A, please log into the web site [email or web address] and access using universal [place] password for that web site. If you do not have the password, please call the hospital operator.  04/09/2021, 2:43 PM

## 2021-04-09 NOTE — Progress Notes (Signed)
Pt informed of room change-129. Pt belongings collected. Transported on bed by NT.

## 2021-04-10 MED ORDER — SODIUM CHLORIDE 0.9 % IV BOLUS
500.0000 mL | Freq: Once | INTRAVENOUS | Status: AC
  Administered 2021-04-10: 500 mL via INTRAVENOUS

## 2021-04-10 MED ORDER — SODIUM BICARBONATE 650 MG PO TABS
650.0000 mg | ORAL_TABLET | Freq: Three times a day (TID) | ORAL | Status: DC
  Administered 2021-04-10 – 2021-04-26 (×27): 650 mg via ORAL
  Filled 2021-04-10 (×50): qty 1

## 2021-04-10 NOTE — Progress Notes (Signed)
With the PROGRESS NOTE    Ryan [name]  VWU:[id number] DOB: [birth year] DOA: 03/30/2021 PCP: Merryl Hacker, No    Brief Narrative:  Ryan [name] is a 60 y.o. male with medical history significant for history of polysubstance abuse including THC, cocaine, alcohol, remote history of tobacco use, history of right proximal femur inter trochanteric fracture status post right IM nail intertrochanteric fixation in 04/02/2021, does not follow-up with a PCP, presents to the emergency department for chief concerns of right hip pain.  Pain is chronic for several months, denies any trauma, difficulty ambulation due to pain. He also endorses an unintentional 35 pound weight loss in the last year, denies bright red blood per rectum and melena. ED work-up, hypotensive, saturating well on room air, Hb 5.6, WBC 6.2, platelet 121, serum sodium 126, potassium 3.9, chloride 97, bicarb 17, BUN 9, serum creatinine of 0.64, nonfasting blood glucose 86.  eGFR is greater than 60. AST 51, ALT 8. Alk phos elevated at 884. Upon arriving the hospital, patient was found to have L5 compression fracture, L3/4 severe stenosis.  She was also found to have widespread bony metastasis  from prostate cancer.   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  Prostate cancer with widespread bony metastasis. L5 compression fracture. 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 Patient doing well, able to walk with assist.  No significant back pain. Followed by oncology.  2.   Severe symptomatic anemia. Folic acid deficiency. Thrombocytopenia. Patient condition relatively stable.  3.  Severe protein calorie malnutrition. Continue Megace, appetite is improving.  4.  Hypokalemia. Hypomagnesemia Hypophosphatemia. Hypocalcemia. Hyponatremia. Sodium level still low, continue sodium chloride.  Patient also has mild metabolic acidosis.  We will start sodium bicarbonate twice a day.     DVT prophylaxis: Lovenox Code Status: full Family Communication:  Disposition Plan:    Status is: Inpatient  Remains inpatient appropriate because:Unsafe d/c plan  Dispo: The patient is from: Home              Anticipated d/c is to: SNF              Patient currently is medically stable to d/c.   Difficult to place patient Yes        I/O last 3 completed shifts: In: 240 [P.O.:240] Out: 2200 [Urine:2200] Total I/O In: -  Out: 500 [Urine:500]     Consultants:  oncology  Procedures: None  Antimicrobials: None  Subjective: Patient feels better, his appetite is better.  He was able to walk with nurse at bedside.  No short of breath or cough. No abdominal pain nausea vomiting.  No fever or chills. No dysuria hematuria.  Objective: Vitals:   04/09/21 1113 04/09/21 2048 04/09/21 2345 04/10/21 0813  BP: (!) 87/51 95/85 105/66 101/62  Pulse: 85 81 86 80  Resp: $Remo'16 16 16 20  'rZipB$ Temp: 99.1 F (37.3 C) 98.8 F (37.1 C) 99.1 F (37.3 C) 98.1 F (36.7 C)  TempSrc: Oral Oral Oral   SpO2:  100% 100% 100%  Weight:      Height:        Intake/Output Summary (Last 24 hours) at 04/10/2021 1339 Last data filed at 04/10/2021 1247 Gross per 24 hour  Intake 240 ml  Output 1750 ml  Net -1510 ml   Filed Weights   03/30/21 1030 04/01/21 1232 04/08/21 0800  Weight: 59 kg 54.7 kg 41.6 kg    Examination:  General exam: Severe malnourished, cachectic. Respiratory system: Clear to auscultation. Respiratory effort normal. Cardiovascular system: S1 & S2 heard, RRR.  No JVD, murmurs, rubs, gallops or clicks. No pedal edema. Gastrointestinal system: Abdomen is nondistended, soft and nontender. No organomegaly or masses felt. Normal bowel sounds heard. Central nervous system: Alert and oriented. No focal neurological deficits. Extremities: Muscle atrophy Skin: No rashes, lesions or ulcers Psychiatry: Judgement and insight appear normal. Mood & affect appropriate.     Data Reviewed: I have personally reviewed following labs and imaging studies  CBC: Recent Labs  Lab 04/04/21 0505 04/05/21 0647 04/06/21 0700 04/07/21 0657 04/08/21 0624  WBC 5.6 4.7 5.0 6.5 4.9  NEUTROABS 3.7 3.1 3.1 4.5 2.8  HGB 9.2* 9.4* 9.3* 10.3* 9.8*  HCT 27.0* 27.7* 28.2* 30.6* 28.8*  MCV 78.9* 78.7* 80.1 77.9* 78.9*  PLT 131* 153 173 143* 268*   Basic Metabolic Panel: Recent Labs  Lab 04/04/21 0505 04/05/21 0647 04/06/21 0700 04/07/21 0657 04/08/21 0624  NA 126* 128* 130* 126* 127*  K 4.2 4.4 4.6 5.2* 4.6  CL 102 104 105 98 101  CO2 19* 19* 19* 16* 19*  GLUCOSE 93 84 108* 104* 100*  BUN 6 5* $Re'6 7 7  'wyV$ CREATININE 0.58* 0.41* 0.55* 0.54* 0.39*  CALCIUM 5.2* 6.1* 7.3* 8.0* 7.2*  MG 1.8 1.5* 1.5* 1.6* 1.7  PHOS 2.5 2.4* 2.7  --  2.8   GFR: Estimated Creatinine Clearance: 58.5 mL/min (A) (by C-G formula based on SCr of 0.39 mg/dL (L)). Liver Function Tests: Recent Labs  Lab 04/04/21 0505  ALBUMIN 2.0*   No results for input(s): LIPASE, AMYLASE in the last 168 hours. No results for input(s): AMMONIA in the last 168 hours. Coagulation Profile: No results for input(s): INR, PROTIME in the last 168 hours. Cardiac Enzymes: No results for input(s): CKTOTAL, CKMB, CKMBINDEX, TROPONINI in the last 168 hours. BNP (last 3 results) No results for input(s): PROBNP in the last 8760 hours. HbA1C: No results for input(s): HGBA1C in the last 72 hours. CBG: No results for input(s): GLUCAP in the last 168 hours. Lipid Profile: No results for input(s): CHOL, HDL, LDLCALC,  TRIG, CHOLHDL, LDLDIRECT in the last 72 hours. Thyroid Function Tests: No results for input(s): TSH, T4TOTAL, FREET4, T3FREE, THYROIDAB in the last 72 hours. Anemia Panel: No results for input(s): VITAMINB12, FOLATE, FERRITIN, TIBC, IRON, RETICCTPCT in the last 72 hours. Sepsis Labs: No results for input(s): PROCALCITON, LATICACIDVEN in the last 168 hours.  No results found for this or any previous visit (from the past 240 hour(s)).       Radiology Studies: No results found.      Scheduled Meds:  enoxaparin (LOVENOX) injection  40 mg Subcutaneous Q24H   feeding supplement  237 mL Oral TID BM   ferrous sulfate  325 mg Oral Q breakfast   folic acid  1 mg Oral Daily   megestrol  400 mg Oral Daily   melatonin  5 mg Oral QHS   midodrine  10 mg Oral TID WC   multivitamin with minerals  1 tablet Oral Daily  polyethylene glycol  17 g Oral Daily   sodium chloride  1 g Oral BID WC   Vitamin D (Ergocalciferol)  50,000 Units Oral Q7 days   Continuous Infusions:   LOS: 11 days    Time spent: 28 minutes    Sharen Hones, MD [facility]   To contact the attending provider between 7A-7P or the covering provider during after hours 7P-7A, please log into the web site [email or web address] and access using universal  password for that web site. If you do not have the password, please call the hospital operator.  04/10/2021, 1:39 PM

## 2021-04-10 NOTE — Progress Notes (Signed)
Pt refusing his calcium, says he won't take it. Dr. Roosevelt Locks notified. Orders received.

## 2021-04-10 NOTE — Plan of Care (Signed)
End of Shift Summary:  Alert and oriented x4. VSS on room air. Pain managed with prn medications. Denies n/v. Robitussin for cough x1. Vistaril for anxiety x1. Urine output adequate, voiding in urinal. Good appetite with several snacks overnight. Remained free from falls or injury. Call bell within reach and able to use.   Problem: Clinical Measurements: Goal: Ability to maintain clinical measurements within normal limits will improve Outcome: Progressing   Problem: Clinical Measurements: Goal: Will remain free from infection Outcome: Progressing   Problem: Clinical Measurements: Goal: Diagnostic test results will improve Outcome: Progressing   Problem: Activity: Goal: Risk for activity intolerance will decrease Outcome: Progressing   Problem: Nutrition: Goal: Adequate nutrition will be maintained Outcome: Progressing   Problem: Coping: Goal: Level of anxiety will decrease Outcome: Progressing   Problem: Pain Managment: Goal: General experience of comfort will improve Outcome: Progressing   Problem: Safety: Goal: Ability to remain free from injury will improve Outcome: Progressing   Problem: Education: Goal: Knowledge of General Education information will improve Description: Including pain rating scale, medication(s)/side effects and non-pharmacologic comfort measures Outcome: Progressing   Problem: Health Behavior/Discharge Planning: Goal: Ability to manage health-related needs will improve Outcome: Progressing

## 2021-04-11 LAB — BASIC METABOLIC PANEL
Anion gap: 4 — ABNORMAL LOW (ref 5–15)
BUN: 9 mg/dL (ref 6–20)
CO2: 21 mmol/L — ABNORMAL LOW (ref 22–32)
Calcium: 7.5 mg/dL — ABNORMAL LOW (ref 8.9–10.3)
Chloride: 102 mmol/L (ref 98–111)
Creatinine, Ser: 0.63 mg/dL (ref 0.61–1.24)
GFR, Estimated: >60 mL/min (ref >60)
Glucose, Bld: 119 mg/dL — ABNORMAL HIGH (ref 70–99)
Potassium: 4.2 mmol/L (ref 3.5–5.1)
Sodium: 127 mmol/L — ABNORMAL LOW (ref 135–145)

## 2021-04-11 LAB — MAGNESIUM: Magnesium: 1.5 mg/dL — ABNORMAL LOW (ref 1.7–2.4)

## 2021-04-11 LAB — PHOSPHORUS: Phosphorus: 2.7 mg/dL (ref 2.5–4.6)

## 2021-04-11 MED ORDER — MAGNESIUM SULFATE 4 GM/100ML IV SOLN
4.0000 g | Freq: Once | INTRAVENOUS | Status: AC
  Administered 2021-04-11: 10:00:00 4 g via INTRAVENOUS
  Filled 2021-04-11: qty 100

## 2021-04-11 NOTE — Plan of Care (Signed)
End of Shift Summary:   Alert and oriented x4. VSS on room air. Pain managed with prn medications. Denies n/v. Robitussin for cough x1. Urine output adequate, voiding in urinal. Good appetite with several snacks overnight. Remained free from falls or injury. Call bell within reach and able to use.    Problem: Clinical Measurements: Goal: Ability to maintain clinical measurements within normal limits will improve Outcome: Progressing   Problem: Clinical Measurements: Goal: Will remain free from infection Outcome: Progressing   Problem: Clinical Measurements: Goal: Diagnostic test results will improve Outcome: Progressing   Problem: Activity: Goal: Risk for activity intolerance will decrease Outcome: Progressing   Problem: Nutrition: Goal: Adequate nutrition will be maintained Outcome: Progressing   Problem: Coping: Goal: Level of anxiety will decrease Outcome: Progressing   Problem: Pain Managment: Goal: General experience of comfort will improve Outcome: Progressing   Problem: Safety: Goal: Ability to remain free from injury will improve Outcome: Progressing   Problem: Education: Goal: Knowledge of General Education information will improve Description: Including pain rating scale, medication(s)/side effects and non-pharmacologic comfort measures Outcome: Progressing   Problem: Health Behavior/Discharge Planning: Goal: Ability to manage health-related needs will improve Outcome: Progressing

## 2021-04-11 NOTE — Progress Notes (Signed)
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 Brief Narrative:  Ryan [name] is a 60 y.o. male with medical history significant for history of polysubstance abuse including THC, cocaine, alcohol, remote history of tobacco use, history of right proximal femur inter trochanteric fracture status post right IM nail intertrochanteric fixation in 04/02/2021, does not follow-up with a PCP, presents to the emergency department for chief concerns of right hip pain.  Pain is chronic for several months, denies any trauma, difficulty ambulation due to pain. He also endorses an unintentional 35 pound weight loss in the last year, denies bright red blood per rectum and melena. ED work-up, hypotensive, saturating well on room air, Hb 5.6, WBC 6.2, platelet 121, serum sodium 126, potassium 3.9, chloride 97, bicarb 17, BUN 9, serum creatinine of 0.64, nonfasting blood glucose 86.  eGFR is greater than 60. AST 51, ALT 8. Alk phos elevated at 884. Upon arriving the hospital, patient was found to have L5 compression fracture, L3/4 severe stenosis.  She was also found to have widespread bony metastasis  from prostate cancer.   Assessment & Plan:   Principal Problem:   Symptomatic anemia Active Problems:   Hyponatremia   Closed rib fracture   Acute anemia   Thrombocytopenia (HCC)   Thoracic compression fracture, closed, initial encounter [facility])   Pathologic fracture of thoracic vertebrae, initial encounter   Malnourished ([place])   Protein-calorie malnutrition, severe ([place])   Cancer cachexia ([place])   Infestation by bed bug   Prostate cancer metastatic to bone (HCC)   Hypotension    #1.  Prostate cancer with widespread bony metastasis. L5 compression fracture. 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 Patient is a followed by oncology, continue PT/OT.  Pending nursing home placement.  2.  Severe  symptomatic anemia. Folic acid deficiency. Thrombocytopenia. Relatively stable.  3.  Hypokalemia. Hypomagnesemia Hypophosphatemia. Hypocalcemia. Hyponatremia. Non-anion gap metabolic acidosis.  Supplemented with magnesium again.  Patient is also on sodium chloride tablets and sodium bicarbonate orally. Patient had borderline blood pressure yesterday, will check cortisol level.  DVT prophylaxis: Lovenox Code Status: full Family Communication:  Disposition Plan:    Status is: Inpatient  Remains inpatient appropriate because:Unsafe d/c plan  Dispo: The patient is from: Home              Anticipated d/c is to: SNF              Patient currently is medically stable to d/c.   Difficult to place patient Yes        I/O last 3 completed shifts: In: 240 [P.O.:240] Out: 2400 [Urine:2400] Total I/O In: -  Out: 400 [Urine:400]     Consultants:  Oncology  Procedures: None  Antimicrobials: None   Subjective: Patient had a borderline blood pressure yesterday, received IV fluids bolus. Appetite is poor, but gradually improving. No nausea vomiting. No dysuria hematuria pain No fever or chills.  Objective: Vitals:   04/10/21 2044 04/11/21 0033 04/11/21 0454 04/11/21 0829  BP: 111/61 (!) 98/54 95/61 98/60  Pulse: 88 87 87 82  Resp: _0 Temp: 100.1 F (37.8 C) 97.9 F (36.6 C) 99.5 F (37.5 C)   TempSrc:  Oral    SpO2: 99% 98% 100% 100%  Weight:      Height:        Intake/Output Summary (Last 24 hours) at 04/11/2021 1205 Last data filed  at 04/11/2021 1022 Gross per 24 hour  Intake --  Output 1400 ml  Net -1400 ml   Filed Weights   03/30/21 1030 04/01/21 1232 04/08/21 0800  Weight: 59 kg 54.7 kg 41.6 kg    Examination:  General exam: Ill-appearing, cachectic. Respiratory system: Clear to auscultation. Respiratory effort normal. Cardiovascular system: S1 & S2 heard, RRR. No JVD, murmurs, rubs, gallops or clicks. No pedal  edema. Gastrointestinal system: Abdomen is nondistended, soft and nontender. No organomegaly or masses felt. Normal bowel sounds heard. Central nervous system: Alert and oriented. No focal neurological deficits. Extremities: Significant muscle atrophy. Skin: No rashes, lesions or ulcers Psychiatry: Judgement and insight appear normal. Mood & affect appropriate.     Data Reviewed: I have personally reviewed following labs and imaging studies  CBC: Recent Labs  Lab 04/05/21 0647 04/06/21 0700 04/07/21 0657 04/08/21 0624  WBC 4.7 5.0 6.5 4.9  NEUTROABS 3.1 3.1 4.5 2.8  HGB 9.4* 9.3* 10.3* 9.8*  HCT 27.7* 28.2* 30.6* 28.8*  MCV 78.7* 80.1 77.9* 78.9*  PLT 153 173 143* 371*   Basic Metabolic Panel: Recent Labs  Lab 04/05/21 0647 04/06/21 0700 04/07/21 0657 04/08/21 0624 04/11/21 0415  NA 128* 130* 126* 127* 127*  K 4.4 4.6 5.2* 4.6 4.2  CL 104 105 98 101 102  CO2 19* 19* 16* 19* 21*  GLUCOSE 84 108* 104* 100* 119*  BUN 5* _0 CREATININE 0.41* 0.55* 0.54* 0.39* 0.63  CALCIUM 6.1* 7.3* 8.0* 7.2* 7.5*  MG 1.5* 1.5* 1.6* 1.7 1.5*  PHOS 2.4* 2.7  --  2.8 2.7   GFR: Estimated Creatinine Clearance: 58.5 mL/min (by C-G formula based on SCr of 0.63 mg/dL). Liver Function Tests: No results for input(s): AST, ALT, ALKPHOS, BILITOT, PROT, ALBUMIN in the last 168 hours. No results for input(s): LIPASE, AMYLASE in the last 168 hours. No results for input(s): AMMONIA in the last 168 hours. Coagulation Profile: No results for input(s): INR, PROTIME in the last 168 hours. Cardiac Enzymes: No results for input(s): CKTOTAL, CKMB, CKMBINDEX, TROPONINI in the last 168 hours. BNP (last 3 results) No results for input(s): PROBNP in the last 8760 hours. HbA1C: No results for input(s): HGBA1C in the last 72 hours. CBG: No results for input(s): GLUCAP in the last 168 hours. Lipid Profile: No results for input(s): CHOL, HDL, LDLCALC, TRIG, CHOLHDL, LDLDIRECT in the last 72  hours. Thyroid Function Tests: No results for input(s): TSH, T4TOTAL, FREET4, T3FREE, THYROIDAB in the last 72 hours. Anemia Panel: No results for input(s): VITAMINB12, FOLATE, FERRITIN, TIBC, IRON, RETICCTPCT in the last 72 hours. Sepsis Labs: No results for input(s): PROCALCITON, LATICACIDVEN in the last 168 hours.  No results found for this or any previous visit (from the past 240 hour(s)).       Radiology Studies: No results found.      Scheduled Meds:  enoxaparin (LOVENOX) injection  40 mg Subcutaneous Q24H   feeding supplement  237 mL Oral TID BM   ferrous sulfate  325 mg Oral Q breakfast   folic acid  1 mg Oral Daily   megestrol  400 mg Oral Daily   melatonin  5 mg Oral QHS   midodrine  10 mg Oral TID WC   multivitamin with minerals  1 tablet Oral Daily   polyethylene glycol  17 g Oral Daily   sodium bicarbonate  650 mg Oral TID   sodium chloride  1 g Oral BID WC   Vitamin D (Ergocalciferol)  50,000 Units Oral Q7 days   Continuous Infusions:   LOS: 12 days    Time spent: 26 minutes    Sharen Hones, MD [facility]   To contact the attending provider between 7A-7P or the covering provider during after hours 7P-7A, please log into the web site [email or web address] and access using universal [place] password for that web site. If you do not have the password, please call the hospital operator.  04/11/2021, 12:05 PM

## 2021-04-12 DIAGNOSIS — E43 Unspecified severe protein-calorie malnutrition: Secondary | ICD-10-CM

## 2021-04-12 LAB — CORTISOL-AM, BLOOD: Cortisol - AM: 3.1 ug/dL — ABNORMAL LOW (ref 6.7–22.6)

## 2021-04-12 NOTE — Plan of Care (Signed)
  Problem: Education: Goal: Knowledge of General Education information will improve Description: Including pain rating scale, medication(s)/side effects and non-pharmacologic comfort measures Outcome: Progressing   Problem: Nutrition: Goal: Adequate nutrition will be maintained Outcome: Progressing   Problem: Elimination: Goal: Will not experience complications related to bowel motility Outcome: Progressing   Problem: Safety: Goal: Ability to remain free from injury will improve Outcome: Progressing

## 2021-04-12 NOTE — Progress Notes (Signed)
PROGRESS NOTE    Ryan [name]  MQK:[id number] DOB: 1961/01/04 DOA: 03/30/2021 PCP: Pcp, No   Follow-up on metastatic prostate cancer. Brief Narrative:  Ryan [name] is a 60 y.o. male with medical history significant for history of polysubstance abuse including THC, cocaine, alcohol, remote history of tobacco use, history of right proximal femur inter trochanteric fracture status post right IM nail intertrochanteric fixation in 04/02/2021, does not follow-up with a PCP, presents to the emergency department for chief concerns of right hip pain.  Pain is chronic for several months, denies any trauma, difficulty ambulation due to pain. He also endorses an unintentional 35 pound weight loss in the last year, denies bright red blood per rectum and melena. ED work-up, hypotensive, saturating well on room air, Hb 5.6, WBC 6.2, platelet 121, serum sodium 126, potassium 3.9, chloride 97, bicarb 17, BUN 9, serum creatinine of 0.64, nonfasting blood glucose 86.  eGFR is greater than 60. AST 51, ALT 8. Alk phos elevated at 884. Upon arriving the hospital, patient was found to have L5 compression fracture, L3/4 severe stenosis.  She was also found to have widespread bony metastasis  from prostate cancer.   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  Prostate cancer with widespread bony metastasis. L5 compression fracture. 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 Patient condition stable, followed by oncology.  Also seen by PT/OT,  2.  Severe symptomatic  anemia. Folic acid deficiency. Thrombocytopenia. Condition relatively stable.  Continue folic acid.  3.  Hypokalemia. Hypomagnesemia Hypophosphatemia. Hypocalcemia. Hyponatremia. Non-anion gap metabolic acidosis. Hypocalcemia mainly from low albumin.  Continue sodium chloride for hyponatremia.  Continue sodium bicarbonate for metabolic acidosis.   DVT prophylaxis: Lovenox Code Status: Full Family Communication:  Disposition Plan:    Status is: Inpatient  Remains inpatient appropriate because:Inpatient level of care appropriate due to severity of illness  Dispo: The patient is from: Home              Anticipated d/c is to: SNF              Patient currently is medically stable to d/c.   Difficult to place patient Yes        I/O last 3 completed shifts: In: 1080 [P.O.:1080] Out: 1525 [Urine:1525] Total I/O In: 360 [P.O.:360] Out: 500 [Urine:500]     Consultants:  Oncology  Procedures: None  Antimicrobials: None   Subjective: Patient seem to have a better appetite.  No nausea vomiting abdominal pain. No short of breath or cough. No dysuria hematuria. No fever or chills.  Objective: Vitals:   04/11/21 2314 04/12/21 0419 04/12/21 0859 04/12/21 1245  BP: (!) 104/51 102/61 (!) 93/58 120/64  Pulse: 82 76 84 81  Resp: $Remo'18 16 18 16  'CNcGh$ Temp: 98.4 F (36.9 C) 98.1 F (36.7 C) 98.1 F (36.7 C) 98.1 F (36.7 C)  TempSrc:    Oral  SpO2: 99% 99% 100% 100%  Weight:      Height:        Intake/Output Summary (Last 24 hours) at 04/12/2021 1307 Last  data filed at 04/12/2021 1250 Gross per 24 hour  Intake 1080 ml  Output 975 ml  Net 105 ml   Filed Weights   03/30/21 1030 04/01/21 1232 04/08/21 0800  Weight: 59 kg 54.7 kg 41.6 kg    Examination:  General exam: Severely malnourished, cachectic. Respiratory system: Clear to auscultation. Respiratory effort normal. Cardiovascular system: S1 & S2 heard, RRR. No JVD, murmurs, rubs, gallops or clicks. No pedal  edema. Gastrointestinal system: Abdomen is nondistended, soft and nontender. No organomegaly or masses felt. Normal bowel sounds heard. Central nervous system: Alert and oriented. No focal neurological deficits. Extremities: Symmetric 5 x 5 power. Skin: No rashes, lesions or ulcers Psychiatry: Judgement and insight appear normal. Mood & affect appropriate.     Data Reviewed: I have personally reviewed following labs and imaging studies  CBC: Recent Labs  Lab 04/06/21 0700 04/07/21 0657 04/08/21 0624  WBC 5.0 6.5 4.9  NEUTROABS 3.1 4.5 2.8  HGB 9.3* 10.3* 9.8*  HCT 28.2* 30.6* 28.8*  MCV 80.1 77.9* 78.9*  PLT 173 143* 161*   Basic Metabolic Panel: Recent Labs  Lab 04/06/21 0700 04/07/21 0657 04/08/21 0624 04/11/21 0415  NA 130* 126* 127* 127*  K 4.6 5.2* 4.6 4.2  CL 105 98 101 102  CO2 19* 16* 19* 21*  GLUCOSE 108* 104* 100* 119*  BUN $Re'6 7 7 9  'IyU$ CREATININE 0.55* 0.54* 0.39* 0.63  CALCIUM 7.3* 8.0* 7.2* 7.5*  MG 1.5* 1.6* 1.7 1.5*  PHOS 2.7  --  2.8 2.7   GFR: Estimated Creatinine Clearance: 58.5 mL/min (by C-G formula based on SCr of 0.63 mg/dL). Liver Function Tests: No results for input(s): AST, ALT, ALKPHOS, BILITOT, PROT, ALBUMIN in the last 168 hours. No results for input(s): LIPASE, AMYLASE in the last 168 hours. No results for input(s): AMMONIA in the last 168 hours. Coagulation Profile: No results for input(s): INR, PROTIME in the last 168 hours. Cardiac Enzymes: No results for input(s): CKTOTAL, CKMB, CKMBINDEX, TROPONINI in the last 168 hours. BNP (last 3 results) No results for input(s): PROBNP in the last 8760 hours. HbA1C: No results for input(s): HGBA1C in the last 72 hours. CBG: No results for input(s): GLUCAP in the last 168 hours. Lipid Profile: No results for input(s): CHOL, HDL, LDLCALC, TRIG, CHOLHDL, LDLDIRECT in the last 72 hours. Thyroid Function Tests: No results for input(s): TSH, T4TOTAL, FREET4, T3FREE, THYROIDAB in the last 72  hours. Anemia Panel: No results for input(s): VITAMINB12, FOLATE, FERRITIN, TIBC, IRON, RETICCTPCT in the last 72 hours. Sepsis Labs: No results for input(s): PROCALCITON, LATICACIDVEN in the last 168 hours.  No results found for this or any previous visit (from the past 240 hour(s)).       Radiology Studies: No results found.      Scheduled Meds:  enoxaparin (LOVENOX) injection  40 mg Subcutaneous Q24H   feeding supplement  237 mL Oral TID BM   ferrous sulfate  325 mg Oral Q breakfast   folic acid  1 mg Oral Daily   megestrol  400 mg Oral Daily   melatonin  5 mg Oral QHS   midodrine  10 mg Oral TID WC   multivitamin with minerals  1 tablet Oral Daily   polyethylene glycol  17 g Oral Daily   sodium bicarbonate  650 mg Oral TID   sodium chloride  1 g Oral BID WC   Vitamin D (Ergocalciferol)  50,000 Units Oral Q7 days   Continuous Infusions:   LOS: 13  days    Time spent: 26 minutes    Sharen Hones, MD [facility]   To contact the attending provider between 7A-7P or the covering provider during after hours 7P-7A, please log into the web site [email or web address] and access using universal  password for that web site. If you do not have the password, please call the hospital operator.  04/12/2021, 1:07 PM

## 2021-04-12 NOTE — TOC Progression Note (Signed)
Transition of Care [facility]) - Progression Note    Patient Details  Name: Ryan [name] MRN: [id number] Date of Birth: 10-30-1960  Transition [facility]) CM/SW [place], RN Phone Number: 04/12/2021, 1:21 PM  Clinical Narrative:   patient has no bed offers.  Will check and contact facilities in the morning, when business hours resume from holiday.  TOC contact information given, TOC to follow to discharge.         Expected Discharge Plan and Services                                                 Social Determinants of Health (SDOH) Interventions    Readmission Risk Interventions No flowsheet data found.

## 2021-04-13 MED ORDER — ENOXAPARIN SODIUM 30 MG/0.3ML IJ SOSY
30.0000 mg | PREFILLED_SYRINGE | INTRAMUSCULAR | Status: DC
  Filled 2021-04-13 (×3): qty 0.3

## 2021-04-13 NOTE — Progress Notes (Signed)
   04/13/21 0409  Clinical Encounter Type  Visited With Patient  Visit Type Initial;Spiritual support;Social support  Referral From Nurse  Consult/Referral To Chaplain  Spiritual Encounters  Spiritual Needs Prayer;Emotional;Sacred text   Ryan [name] responded to a page from nurse, that Ryan [name] could use a visit. PT was able to express his health concerns and spoke of how grateful he was for the care provided to him from medical staff; especially nurse Stanton Kidney.  PT spoke of his love he had for his 3 estranged children, and the faith he has in 22. PT spoke of his love for music, songwriting, and how he uses it as a coping mechanism, Chaplain explored numerous ways of coping with PT, and told him how important it is to stay connected to others. Chaplain engaged patient in conversation to help promote self-expression. Chaplain validated his feelings and encouraged him appropriately.  Storytelling, listening, prayer, and hospitality were components of this visit.

## 2021-04-13 NOTE — Progress Notes (Signed)
PT Cancellation Note  Patient Details Name: Ryan [name] MRN: [id number] DOB: February 05, 1961   Cancelled Treatment:     PT attempt. Pt refused requesting [name] return at 230 pm. Will return to progress pt to OOB activity this afternoon.    [name] 04/13/2021, 11:21 AM

## 2021-04-13 NOTE — Progress Notes (Signed)
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 TLSO recommended from nurosurgery and devliered to room.    PT Comments    Pt was asleep in bed upon arriving. He agrees to session with max encouragement. Pt continues to require assistance with all mobility, transfers, and gait. He has a successful BM on BSC during session but required total assist with hygiene due to inability to maintain balance in static standing. Constant min assist during ambulation 20 ft with RW to prevent LOB. Pt fatigued extremely quickly but overall did progress slightly. He will greatly benefit from SNF at DC to address deficits while maximizing independence with ADLs.    Follow Up Recommendations  SNF     Equipment Recommendations  None recommended by PT       Precautions / Restrictions Precautions Precautions: Fall Required Braces or Orthoses: Spinal Brace Spinal Brace: Thoracolumbosacral orthotic (refused to wear due to stomach pain) Restrictions Weight Bearing Restrictions: No    Mobility  Bed Mobility Overal bed mobility: Needs Assistance Bed Mobility: Supine to Sit;Sit to Supine (HOB elevated) Rolling: Supervision Sidelying to sit: Min assist Supine to sit: Min assist Sit to supine: Min assist   General bed mobility comments: pt performed log roll technique however required min assist. was unwilling to wear TLSO." MD said I dont need to wear it when my stomach hurts."    Transfers Overall transfer level: Needs assistance Equipment  used: Rolling walker (2 wheeled) Transfers: Sit to/from Stand Sit to Stand: Min assist;From elevated surface         General transfer comment: min assist to stand from EOB/ [facility]  Ambulation/Gait Ambulation/Gait assistance: Min assist Gait Distance (Feet): 20 Feet Assistive device: Rolling walker (2 wheeled) Gait Pattern/deviations: Trunk flexed;Narrow base of support;Step-through pattern Gait velocity: decreased   General Gait Details: Pt was willing to ambulate to doorway opf room and back to EOB but unwilling to progress further 2/2 to fatigue.     Balance Overall balance assessment: Needs assistance Sitting-balance support: Feet supported;Bilateral upper extremity supported Sitting balance-Leahy Scale: Fair     Standing balance support: Bilateral upper extremity supported;During functional activity Standing balance-Leahy Scale: Poor Standing balance comment: high fall risk with poor insight of deficits         Cognition Arousal/Alertness: Awake/alert Behavior During Therapy: WFL for tasks assessed/performed Overall Cognitive Status: Within Functional Limits for tasks assessed          General Comments: patient able to follow all commands without difficulty. does require redirecting to stay focused on desired task.             Pertinent Vitals/Pain Pain Assessment: No/denies pain Pain Score: 0-No pain     PT Goals (current goals can now be found in the care plan section) Acute Rehab PT Goals Patient Stated Goal: rehab x 3 months then home Progress towards PT goals: Progressing toward goals    Frequency    Min 2X/week      PT Plan Current plan remains appropriate    Co-evaluation     PT goals addressed during session: Mobility/safety with  mobility;Balance;Proper use of DME;Strengthening/ROM        AM-PAC PT "6 Clicks" Mobility   Outcome Measure  Help needed turning from your back to your side while in a flat bed without using bedrails?: A  Little Help needed moving from lying on your back to sitting on the side of a flat bed without using bedrails?: A Little Help needed moving to and from a bed to a chair (including a wheelchair)?: A Little Help needed standing up from a chair using your arms (e.g., wheelchair or bedside chair)?: A Lot Help needed to walk in hospital room?: A Lot Help needed climbing 3-5 steps with a railing? : A Lot 6 Click Score: 15    End of Session Equipment Utilized During Treatment: Gait belt Activity Tolerance: Patient limited by fatigue Patient left: in bed;with call bell/phone within reach;with bed alarm set Nurse Communication: Mobility status PT Visit Diagnosis: Unsteadiness on feet (R26.81);Muscle weakness (generalized) (M62.81);Difficulty in walking, not elsewhere classified (R26.2)     Time: 9234-1443 PT Time Calculation (min) (ACUTE ONLY): 19 min  Charges:  $Therapeutic Activity: 8-22 mins                     Julaine Fusi PTA 04/13/21, 3:58 PM

## 2021-04-13 NOTE — Evaluation (Signed)
Occupational Therapy Re-Evaluation Patient Details Name: Ryan [name] MRN: [id number] DOB: [birth year] Today's Date: 04/13/2021    History of Present Illness Patient is a 60 year old male with prostate cancer with widespread bony metastasis, L5 fracture and L3/4 severe stenosis with LE weakness, MRI brain and C/T/L spine shows extensive metastasis in the skull base and whole spine, questionable acute CVA among other things,  Hypocalcemia most likely due to nutritional deficiency and associated hypoalbuminemia, hyoptension. TLSO recommended from nurosurgery and devliered to room.   Clinical Impression   Ryan [name] was seen for OT re-evaluation on this date. Upon arrival to room pt awake/alert, semi-supine in bed. Pt generally verbose t/o session. He endorses dissatisfaction with therapist in room, however agreeable to OT tx session with encouragement. Extensive time taken to provide pt education on importance of functional activity during hospital stay. Pt completes BUE there-ex as described below. He is able to complete using red theraband as provided at past OT session. Pt engages in limited participation with therapy services. Goals down-graded to reflect pt current functional status/abilities. Pt continues to benefit from skilled OT services to maximize return to PLOF and minimize risk of future falls, injury, caregiver burden, and readmission. Will continue to follow POC. Discharge recommendation remains appropriate.      Follow Up Recommendations  SNF    Equipment Recommendations  Other (comment) (TBD)    Recommendations for Other Services       Precautions / Restrictions Precautions Precautions: Fall Required Braces or Orthoses: Spinal Brace Spinal Brace: Thoracolumbosacral orthotic Restrictions Weight Bearing Restrictions: No      Mobility Bed Mobility Overal bed mobility: Needs Assistance Bed Mobility: Supine to Sit;Sit to Supine (HOB elevated) Rolling: Supervision Sidelying  to sit: Min assist Supine to sit: Min assist Sit to supine: Min assist   General bed mobility comments: Pt declines all OOB/EOB activity per chart, requires MIN A for bed mobility this date.    Transfers Overall transfer level: Needs assistance Equipment used: Rolling walker (2 wheeled) Transfers: Sit to/from Stand Sit to Stand: Min assist;From elevated surface         General transfer comment: min assist to stand from EOB/ [facility]    Balance Overall balance assessment: Needs assistance Sitting-balance support: Feet supported;Bilateral upper extremity supported Sitting balance-Leahy Scale: Fair     Standing balance support: Bilateral upper extremity supported;During functional activity Standing balance-Leahy Scale: Poor Standing balance comment: high fall risk with poor insight of deficits                           ADL either performed or assessed with clinical judgement   ADL Overall ADL's : Needs assistance/impaired Eating/Feeding: Set up Eating/Feeding Details (indicate cue type and reason): requires encouragement to eat             Upper Body Dressing : Moderate assistance;Bed level   Lower Body Dressing: Minimal assistance;Sit to/from stand   Toilet Transfer: BSC;Minimal assistance;RW   Toileting- Clothing Manipulation and Hygiene: Bed level;Set up         General ADL Comments: Pt generally declines all OOB/EOB activity this date. He is functionally able to move with PT during session, but generally remains unmotivated to engage in functional activity. When agreeable he requires MIN-MOD A for ADL management.     Vision Baseline Vision/History: No visual deficits Patient Visual Report: No change from baseline       Perception     Praxis  Pertinent Vitals/Pain Pain Assessment: No/denies pain Pain Score: 0-No pain     Hand Dominance     Extremity/Trunk Assessment Upper Extremity Assessment Upper Extremity Assessment: Generalized  weakness   Lower Extremity Assessment Lower Extremity Assessment: Generalized weakness       Communication Communication Communication: No difficulties   Cognition Arousal/Alertness: Awake/alert Behavior During Therapy: WFL for tasks assessed/performed;Agitated Overall Cognitive Status: Within Functional Limits for tasks assessed                                 General Comments: patient generally argumentative t/o session, but is able to follow VCs without difficulty when he chooses. Requires redirecting to stay focused on desired task.   General Comments       Exercises Other Exercises Other Exercises: Extensive time taken to educate pt on role of OT and importance of engaging in functional activity during hospital stay. Pt engaged in BUE Ther-ex including shoulder flexion, abduction, diagonal abduction, elbow flexion, and extension.   Shoulder Instructions      Home Living Family/patient expects to be discharged to:: Private residence Living Arrangements: Alone Available Help at Discharge: Available PRN/intermittently Type of Home: Apartment Home Access: Level entry     Home Layout: One level               Home Equipment: Walker - 2 wheels          Prior Functioning/Environment Level of Independence: Independent with assistive device(s)  Gait / Transfers Assistance Needed: patient reports he is Mod I for ambulation with rolling walker ADL's / Homemaking Assistance Needed: IND in ADL. Does not drive, as has no car. No longer employed, in the process of applying for disability            OT Problem List: Decreased strength;Decreased knowledge of use of DME or AE;Decreased range of motion;Decreased coordination;Decreased activity tolerance;Impaired balance (sitting and/or standing);Pain;Impaired sensation      OT Treatment/Interventions: Self-care/ADL training;Therapeutic exercise;Patient/family education;Balance training;Energy conservation;DME  and/or AE instruction;Therapeutic activities    OT Goals(Current goals can be found in the care plan section) Acute Rehab OT Goals Patient Stated Goal: rehab x 3 months then home OT Goal Formulation: With patient Time For Goal Achievement: 04/27/21 ADL Goals Pt Will Perform Grooming: with set-up;with supervision;sitting  OT Frequency: Min 1X/week   Barriers to D/C: Decreased caregiver support          Co-evaluation     PT goals addressed during session: Mobility/safety with mobility;Balance;Proper use of DME;Strengthening/ROM        AM-PAC OT "6 Clicks" Daily Activity     Outcome Measure Help from another person eating meals?: A Little Help from another person taking care of personal grooming?: A Little Help from another person toileting, which includes using toliet, bedpan, or urinal?: A Lot Help from another person bathing (including washing, rinsing, drying)?: A Lot Help from another person to put on and taking off regular upper body clothing?: A Little Help from another person to put on and taking off regular lower body clothing?: A Lot 6 Click Score: 15   End of Session    Activity Tolerance: Other (comment);No increased pain (Self-limiting participation.) Patient left: in bed;with call bell/phone within reach;with bed alarm set  OT Visit Diagnosis: Unsteadiness on feet (R26.81);Muscle weakness (generalized) (M62.81) Pain - Right/Left: Right Pain - part of body: Hip  Time: 1405-1430 OT Time Calculation (min): 25 min Charges:  OT General Charges $OT Visit: 1 Visit OT Evaluation $OT Re-eval: 1 Re-eval OT Treatments $Self Care/Home Management : 8-22 mins $Therapeutic Activity: 8-22 mins  Shara Blazing, M.S., OTR/L Ascom: [phone] 04/13/21, 4:23 PM

## 2021-04-13 NOTE — Progress Notes (Signed)
PHARMACIST - PHYSICIAN COMMUNICATION  CONCERNING:  Enoxaparin (Lovenox) for DVT Prophylaxis    RECOMMENDATION: Patient was prescribed enoxaprin 40mg  q24 hours for VTE prophylaxis.   Filed Weights   03/30/21 1030 04/01/21 1232 04/08/21 0800  Weight: 59 kg (130 lb) 54.7 kg (120 lb 9.5 oz) 41.6 kg (91 lb 11.4 oz)    Body mass index is 10.88 kg/m.  Estimated Creatinine Clearance: 58.5 mL/min (by C-G formula based on SCr of 0.63 mg/dL).  Patient is candidate for enoxaparin 30mg  every 24 hours based on Weight <45kg  DESCRIPTION: Pharmacy has adjusted enoxaparin dose per [facility] policy.  Patient is now receiving enoxaparin 30 mg every 24 hours    Lu Duffel, PharmD, BCPS Clinical Pharmacist 04/13/2021 10:27 AM

## 2021-04-13 NOTE — Plan of Care (Addendum)
Pt refused lovenox Provider Dr. Sidney Ace notified. Pt educated and still refused. Stating he told the Doctor yesterday about it causing big blood spots on abdomen.  Pt called out frequent during overnight asking staff "what are you doing?" "Would you go out to eat with me?" Pt repeated calling every 2-3 minutes at times. Chaplain paged to come see patient to help with behaviors.  Problem: Education: Goal: Knowledge of General Education information will improve Description: Including pain rating scale, medication(s)/side effects and non-pharmacologic comfort measures Outcome: Progressing   Problem: Health Behavior/Discharge Planning: Goal: Ability to manage health-related needs will improve Outcome: Progressing   Problem: Clinical Measurements: Goal: Ability to maintain clinical measurements within normal limits will improve Outcome: Progressing Goal: Will remain free from infection Outcome: Progressing Goal: Diagnostic test results will improve Outcome: Progressing Goal: Respiratory complications will improve Outcome: Progressing Goal: Cardiovascular complication will be avoided Outcome: Progressing   Problem: Activity: Goal: Risk for activity intolerance will decrease Outcome: Progressing   Problem: Nutrition: Goal: Adequate nutrition will be maintained Outcome: Progressing   Problem: Coping: Goal: Level of anxiety will decrease Outcome: Progressing   Problem: Elimination: Goal: Will not experience complications related to bowel motility Outcome: Progressing Goal: Will not experience complications related to urinary retention Outcome: Progressing   Problem: Pain Managment: Goal: General experience of comfort will improve Outcome: Progressing   Problem: Safety: Goal: Ability to remain free from injury will improve Outcome: Progressing   Problem: Skin Integrity: Goal: Risk for impaired skin integrity will decrease Outcome: Progressing

## 2021-04-13 NOTE — Progress Notes (Signed)
PROGRESS NOTE    Ryan [name]  OHY:[id number] DOB: Dec 11, 1960 DOA: 03/30/2021 PCP: Pcp, No   Follow-up on metastatic prostate cancer. Brief Narrative:  Ryan [name] is a 60 y.o. male with medical history significant for history of polysubstance abuse including THC, cocaine, alcohol, remote history of tobacco use, history of right proximal femur inter trochanteric fracture status post right IM nail intertrochanteric fixation in 04/02/2021, does not follow-up with a PCP, presents to the emergency department for chief concerns of right hip pain.  Pain is chronic for several months, denies any trauma, difficulty ambulation due to pain. He also endorses an unintentional 35 pound weight loss in the last year, denies bright red blood per rectum and melena. ED work-up, hypotensive, saturating well on room air, Hb 5.6, WBC 6.2, platelet 121, serum sodium 126, potassium 3.9, chloride 97, bicarb 17, BUN 9, serum creatinine of 0.64, nonfasting blood glucose 86.  eGFR is greater than 60. AST 51, ALT 8. Alk phos elevated at 884. Upon arriving the hospital, patient was found to have L5 compression fracture, L3/4 severe stenosis.  She was also found to have widespread bony metastasis  from prostate cancer. Patient is a followed by oncology.  He needed SNF placement, not able to find accepting facility.   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  Prostate cancer with widespread bony metastasis. L5 compression fracture. L3/4 severe spinal stenosis with right lower extremity  weakness. Followed by oncology, condition stable. He is not a surgical candidate per neurosurgery. Is prescribed brace, but not able to tolerate.  2.  Severe symptomatic anemia. Folic acid deficiency. Thrombocytopenia. Condition stable, recheck CBC tomorrow. Patient does not have iron or B12 deficiency.  3.  Hypokalemia. Hypomagnesemia Hypophosphatemia. Hypocalcemia. Hyponatremia. Non-anion gap metabolic acidosis. Patient still on sodium bicarbonate and a sodium chloride tablet.  Calcium level was low, but mainly due to hypoalbuminemia.  #4.  Cancer cachexia with severe protein calorie malnutrition. RD is following, continue megestrol.  Appetite seem to be improving.   DVT prophylaxis: Lovenox Code Status: Full Family Communication:  Disposition Plan:    Status is: Inpatient  Remains inpatient appropriate because:Unsafe d/c plan and Inpatient level of care appropriate due to severity of illness  Dispo: The patient is from: Home              Anticipated d/c is to: SNF              Patient currently is medically stable to d/c.   Difficult to place patient Yes        I/O last 3 completed shifts: In: 360 [P.O.:360] Out: 1000 [Urine:1000] Total I/O In: 120 [P.O.:120] Out: -      Consultants:  Oncology  Procedures: None  Antimicrobials: None   Subjective: Patient states that his appetite has been better, no nausea vomiting abdominal pain. He was able to stand with nurse, walk a few steps around his bed.  He does not have any back pain. He denies any short of breath or cough. He does not have abdominal pain or nausea vomiting.  Objective: Vitals:   04/13/21 0629 04/13/21 0830 04/13/21 0834 04/13/21 1227  BP: 103/64 96/66 106/63 113/64  Pulse: 79 80 79 80  Resp: $Remo'20  17 17  'SmPsk$ Temp: 98.2 F (36.8 C)  97.9 F (36.6 C) 97.9 F (36.6 C)  TempSrc: Oral     SpO2: 99%  100% 100%  Weight:      Height:        Intake/Output Summary (Last 24 hours) at  04/13/2021 1526 Last data filed at 04/13/2021 1037 Gross per 24 hour  Intake 120 ml  Output 400 ml  Net -280 ml   Filed Weights   03/30/21 1030 04/01/21 1232 04/08/21 0800  Weight: 59 kg 54.7 kg 41.6 kg    Examination:  General exam: Severely malnourished, cachectic. Respiratory system: Clear to auscultation. Respiratory effort normal. Cardiovascular system: S1 & S2 heard, RRR. No JVD, murmurs, rubs, gallops or clicks. No pedal edema. Gastrointestinal system: Abdomen is nondistended, soft and nontender. No organomegaly or masses felt. Normal bowel sounds heard. Central nervous system: Alert and oriented. No focal neurological deficits. Extremities: Severe muscle atrophy Skin: No rashes, lesions or ulcers Psychiatry: Judgement and insight appear normal. Mood & affect appropriate.     Data Reviewed: I have personally reviewed following labs and imaging studies  CBC: Recent Labs  Lab 04/07/21 0657 04/08/21 0624  WBC 6.5 4.9  NEUTROABS 4.5 2.8  HGB 10.3* 9.8*  HCT 30.6* 28.8*  MCV 77.9* 78.9*  PLT 143* 212*   Basic Metabolic Panel: Recent Labs  Lab 04/07/21 0657 04/08/21 0624 04/11/21 0415  NA 126* 127* 127*  K 5.2* 4.6 4.2  CL 98 101 102  CO2 16* 19* 21*  GLUCOSE 104* 100* 119*  BUN $Re'7 7 9  'bBt$ CREATININE 0.54* 0.39* 0.63  CALCIUM 8.0* 7.2* 7.5*  MG 1.6* 1.7 1.5*  PHOS  --  2.8 2.7   GFR: Estimated Creatinine Clearance: 58.5 mL/min (by C-G formula based on SCr of 0.63 mg/dL). Liver Function Tests: No results for input(s): AST, ALT, ALKPHOS, BILITOT, PROT, ALBUMIN in the last 168 hours. No results for input(s): LIPASE, AMYLASE in the last 168 hours. No results for input(s): AMMONIA in the last 168 hours. Coagulation Profile: No results for input(s): INR, PROTIME in the last 168 hours. Cardiac Enzymes: No results for input(s): CKTOTAL, CKMB, CKMBINDEX, TROPONINI in the last 168 hours. BNP (last 3 results) No results for input(s): PROBNP in the last 8760  hours. HbA1C: No results for input(s): HGBA1C in the last 72 hours. CBG: No results for input(s): GLUCAP in the last 168 hours. Lipid Profile: No results for input(s): CHOL, HDL, LDLCALC, TRIG, CHOLHDL, LDLDIRECT in the last 72 hours. Thyroid Function Tests: No results for input(s): TSH, T4TOTAL, FREET4, T3FREE, THYROIDAB in the last 72 hours. Anemia Panel: No results for input(s): VITAMINB12, FOLATE, FERRITIN, TIBC, IRON, RETICCTPCT in the last 72 hours. Sepsis Labs: No results for input(s): PROCALCITON, LATICACIDVEN in the last 168 hours.  No results found for this or any previous visit (from the past 240 hour(s)).       Radiology Studies: No results found.      Scheduled Meds:  enoxaparin (LOVENOX) injection  30 mg Subcutaneous Q24H   feeding supplement  237 mL Oral TID BM   ferrous sulfate  325 mg Oral Q breakfast   folic acid  1 mg Oral Daily   megestrol  400 mg Oral Daily   melatonin  5 mg Oral QHS   midodrine  10 mg Oral  TID WC   multivitamin with minerals  1 tablet Oral Daily   polyethylene glycol  17 g Oral Daily   sodium bicarbonate  650 mg Oral TID   sodium chloride  1 g Oral BID WC   Vitamin D (Ergocalciferol)  50,000 Units Oral Q7 days   Continuous Infusions:   LOS: 14 days    Time spent: 26 minutes    Sharen Hones, MD [facility]   To contact the attending provider between 7A-7P or the covering provider during after hours 7P-7A, please log into the web site [email or web address] and access using universal [place] password for that web site. If you do not have the password, please call the hospital operator.  04/13/2021, 3:26 PM

## 2021-04-14 LAB — CBC WITH DIFFERENTIAL/PLATELET
Abs Immature Granulocytes: 0.19 10*3/uL — ABNORMAL HIGH (ref 0.00–0.07)
Basophils Absolute: 0 10*3/uL (ref 0.0–0.1)
Basophils Relative: 1 %
Eosinophils Absolute: 0.1 10*3/uL (ref 0.0–0.5)
Eosinophils Relative: 1 %
HCT: 25.1 % — ABNORMAL LOW (ref 39.0–52.0)
Hemoglobin: 8.5 g/dL — ABNORMAL LOW (ref 13.0–17.0)
Immature Granulocytes: 3 %
Lymphocytes Relative: 23 %
Lymphs Abs: 1.6 10*3/uL (ref 0.7–4.0)
MCH: 26.8 pg (ref 26.0–34.0)
MCHC: 33.9 g/dL (ref 30.0–36.0)
MCV: 79.2 fL — ABNORMAL LOW (ref 80.0–100.0)
Monocytes Absolute: 0.5 10*3/uL (ref 0.1–1.0)
Monocytes Relative: 8 %
Neutro Abs: 4.5 10*3/uL (ref 1.7–7.7)
Neutrophils Relative %: 64 %
Platelets: 192 10*3/uL (ref 150–400)
RBC: 3.17 MIL/uL — ABNORMAL LOW (ref 4.22–5.81)
RDW: 18.3 % — ABNORMAL HIGH (ref 11.5–15.5)
WBC: 6.9 10*3/uL (ref 4.0–10.5)
nRBC: 0 % (ref 0.0–0.2)

## 2021-04-14 LAB — BASIC METABOLIC PANEL
Anion gap: 6 (ref 5–15)
BUN: 11 mg/dL (ref 6–20)
CO2: 19 mmol/L — ABNORMAL LOW (ref 22–32)
Calcium: 8 mg/dL — ABNORMAL LOW (ref 8.9–10.3)
Chloride: 103 mmol/L (ref 98–111)
Creatinine, Ser: 0.57 mg/dL — ABNORMAL LOW (ref 0.61–1.24)
GFR, Estimated: >60 mL/min (ref >60)
Glucose, Bld: 86 mg/dL (ref 70–99)
Potassium: 4.7 mmol/L (ref 3.5–5.1)
Sodium: 128 mmol/L — ABNORMAL LOW (ref 135–145)

## 2021-04-14 LAB — MAGNESIUM: Magnesium: 1.6 mg/dL — ABNORMAL LOW (ref 1.7–2.4)

## 2021-04-14 LAB — PHOSPHORUS: Phosphorus: 3.1 mg/dL (ref 2.5–4.6)

## 2021-04-14 MED ORDER — ENSURE ENLIVE PO LIQD
237.0000 mL | Freq: Two times a day (BID) | ORAL | Status: DC
  Administered 2021-04-14 – 2021-05-11 (×22): 237 mL via ORAL

## 2021-04-14 NOTE — TOC Progression Note (Signed)
Transition of Care [facility]) - Progression Note    Patient Details  Name: Ryan [name] MRN: [id number] Date of Birth: [birth year]  Transition of Care [facility]) CM/SW [place], RN Phone Number: 04/14/2021, 4:19 PM  Clinical Narrative:   Patient received no bed offers to date for SNF         Expected Discharge Plan and Services                                                 Social Determinants of Health (SDOH) Interventions    Readmission Risk Interventions No flowsheet data found.

## 2021-04-14 NOTE — Progress Notes (Signed)
During assessment patient was very talkative. unable to assess abdomen due to paitent position. He denied any abdominal pain but has a poor appetite. Patient was alert and oriented. He was educated on all medications and refused some.

## 2021-04-14 NOTE — Progress Notes (Signed)
I concur with the nursing student's assessment 

## 2021-04-14 NOTE — Progress Notes (Signed)
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 Brief Narrative:  Ryan [name] is a 60 y.o. male with medical history significant for history of polysubstance abuse including THC, cocaine, alcohol, remote history of tobacco use, history of right proximal femur inter trochanteric fracture status post right IM nail intertrochanteric fixation in 04/02/2021, does not follow-up with a PCP, presents to the emergency department for chief concerns of right hip pain.  Pain is chronic for several months, denies any trauma, difficulty ambulation due to pain. He also endorses an unintentional 35 pound weight loss in the last year, denies bright red blood per rectum and melena. ED work-up, hypotensive, saturating well on room air, Hb 5.6, WBC 6.2, platelet 121, serum sodium 126, potassium 3.9, chloride 97, bicarb 17, BUN 9, serum creatinine of 0.64, nonfasting blood glucose 86.  eGFR is greater than 60. AST 51, ALT 8. Alk phos elevated at 884. Upon arriving the hospital, patient was found to have L5 compression fracture, L3/4 severe stenosis.  She was also found to have widespread bony metastasis  from prostate cancer. Patient is a followed by oncology.  He needed SNF placement, not able to find accepting facility.  7/6-had bm today. 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  Prostate cancer with widespread bony metastasis. L5 compression fracture. L3/4 severe spinal stenosis with right  lower extremity weakness. Followed by oncology, condition stable. 7/6 he is not a surgical candidate per neurosurgery  He is prescribed brace, but not able to tolerate    2.  Severe symptomatic anemia. Folic acid deficiency. Thrombocytopenia.: H&H stable.  Platelets improved. Continue to monitor Continue ferrous sulfate  3.  Hypokalemia. Hypomagnesemia Hypophosphatemia. Hypocalcemia. Hyponatremia. Non-anion gap metabolic acidosis.: Patient on sodium bicarb and sodium chloride tablets  Calcium low due to hypoalbuminemia  Continue to monitor     #4.  Cancer cachexia with severe protein calorie malnutrition. RD following, continue megestrol.   Appetite slightly improved today     DVT prophylaxis: Lovenox Code Status: Full Family Communication: none at bedside Disposition Plan:    Status is: Inpatient  Remains inpatient appropriate because:Unsafe d/c plan and Inpatient level of care appropriate due to severity of illness  Dispo: The patient is from: Home              Anticipated d/c is to: SNF              Patient currently is medically stable to d/c.   Difficult to place patient Yes        I/O last 3 completed shifts: In: 120 [P.O.:120] Out: 600 [Urine:600] Total I/O In: 440 [P.O.:440] Out: 300 [Urine:300]     Consultants:  Oncology  Procedures: None  Antimicrobials: None   Subjective: No abd pain, sob, cp  Objective: Vitals:   04/14/21 0800 04/14/21 1159 04/14/21 1246 04/14/21 1555  BP: 96/73 106/63  111/70  Pulse: 82 77  78  Resp:  17  15  Temp: 98 F (  36.7 C) 98.4 F (36.9 C)  97.8 F (36.6 C)  TempSrc: Oral     SpO2: 100% 99%  100%  Weight:   51.5 kg   Height:        Intake/Output Summary (Last 24 hours) at 04/14/2021 1605 Last data filed at 04/14/2021 1421 Gross per 24 hour  Intake 440 ml  Output 500 ml  Net -60 ml   Filed Weights   04/01/21 1232 04/08/21 0800 04/14/21 1246  Weight: 54.7 kg 41.6 kg 51.5 kg     Examination:  General exam: Severely malnourished, cachectic. Respiratory system: Clear to auscultation. Respiratory effort normal. Cardiovascular system: S1 & S2 heard, RRR. No JVD, murmurs, rubs, gallops or clicks. No pedal edema. Gastrointestinal system: Abdomen is nondistended, soft and nontender. No organomegaly or masses felt. Normal bowel sounds heard. Central nervous system: Alert and oriented. No focal neurological deficits. Extremities: Severe muscle atrophy Skin: No rashes, lesions or ulcers Psychiatry: Judgement and insight appear normal. Mood & affect appropriate.     Data Reviewed: I have personally reviewed following labs and imaging studies  CBC: Recent Labs  Lab 04/08/21 0624 04/14/21 0417  WBC 4.9 6.9  NEUTROABS 2.8 4.5  HGB 9.8* 8.5*  HCT 28.8* 25.1*  MCV 78.9* 79.2*  PLT 138* 350   Basic Metabolic Panel: Recent Labs  Lab 04/08/21 0624 04/11/21 0415 04/14/21 0417  NA 127* 127* 128*  K 4.6 4.2 4.7  CL 101 102 103  CO2 19* 21* 19*  GLUCOSE 100* 119* 86  BUN _0 CREATININE 0.39* 0.63 0.57*  CALCIUM 7.2* 7.5* 8.0*  MG 1.7 1.5* 1.6*  PHOS 2.8 2.7 3.1   GFR: Estimated Creatinine Clearance: 72.4 mL/min (A) (by C-G formula based on SCr of 0.57 mg/dL (L)). Liver Function Tests: No results for input(s): AST, ALT, ALKPHOS, BILITOT, PROT, ALBUMIN in the last 168 hours. No results for input(s): LIPASE, AMYLASE in the last 168 hours. No results for input(s): AMMONIA in the last 168 hours. Coagulation Profile: No results for input(s): INR, PROTIME in the last 168 hours. Cardiac Enzymes: No results for input(s): CKTOTAL, CKMB, CKMBINDEX, TROPONINI in the last 168 hours. BNP (last 3 results) No results for input(s): PROBNP in the last 8760 hours. HbA1C: No results for input(s): HGBA1C in the last 72 hours. CBG: No results for input(s): GLUCAP in the last 168 hours. Lipid Profile: No results for input(s): CHOL, HDL, LDLCALC, TRIG, CHOLHDL,  LDLDIRECT in the last 72 hours. Thyroid Function Tests: No results for input(s): TSH, T4TOTAL, FREET4, T3FREE, THYROIDAB in the last 72 hours. Anemia Panel: No results for input(s): VITAMINB12, FOLATE, FERRITIN, TIBC, IRON, RETICCTPCT in the last 72 hours. Sepsis Labs: No results for input(s): PROCALCITON, LATICACIDVEN in the last 168 hours.  No results found for this or any previous visit (from the past 240 hour(s)).       Radiology Studies: No results found.      Scheduled Meds:  enoxaparin (LOVENOX) injection  30 mg Subcutaneous Q24H   feeding supplement  237 mL Oral BID BM   ferrous sulfate  325 mg Oral Q breakfast   folic acid  1 mg Oral Daily   megestrol  400 mg Oral Daily   melatonin  5 mg Oral QHS   midodrine  10 mg Oral TID WC   multivitamin with minerals  1 tablet Oral Daily   polyethylene glycol  17 g Oral Daily   sodium bicarbonate  650 mg Oral TID   sodium  chloride  1 g Oral BID WC   Vitamin D (Ergocalciferol)  50,000 Units Oral Q7 days   Continuous Infusions:   LOS: 15 days    Time spent: 30 min with >50 % on ococ    Nolberto Hanlon, MD [facility]   To contact the attending provider between 7A-7P or the covering provider during after hours 7P-7A, please log into the web site [email or web address] and access using universal [place] password for that web site. If you do not have the password, please call the hospital operator.  04/14/2021, 4:05 PM

## 2021-04-14 NOTE — Progress Notes (Signed)
Nutrition Follow-up  DOCUMENTATION CODES:  Severe malnutrition in context of chronic illness, Underweight  INTERVENTION:  Encourage PO intake, continue current diet as ordered Continue double protein portions with meals. Continue Ensure Enlive BID, each supplement provides 350 kcal and 20 grams of protein . Continue snacks. Continue MVI with minerals daily.  If PO intake does not improve, may need to consider nutrition support soon.  NUTRITION DIAGNOSIS:  Severe Malnutrition related to cancer and cancer related treatments as evidenced by severe muscle depletion, severe fat depletion. - ongoing  GOAL:  Patient will meet greater than or equal to 90% of their needs  - not meeting  MONITOR:  PO intake, Weight trends  REASON FOR ASSESSMENT:  Consult Assessment of nutrition requirement/status  ASSESSMENT:  60 y.o. male presented to ED for intermittent right lateral hip pain that is been ongoing for the past several weeks. Pt had surgery 10 months ago but reports he has not followed-up for pain. Imaging in ED concerning for marrow infiltrative process. PMH relevant for polysubstance abuse.  Found to have prostate cancer with widespread bony metastasis.  Pt resting in bed at the time of visit. Endorses an improvement in his appetite since last assessment. Appears at least stable, based on recorded intake trends. Pt requesting staff go get him snacks from the vending machine today, politely refused but offered items from dining services or the nourishment room. Pt refusing most items saying they are "nasty." Does not want Magic Cup on his tray anymore and also states he is receiving too many ensures. Only wants to receive 1x/d. Tried to reinforce the importance of adequate nutrition intake, pt not willing to listen today and not interested in eating foods unless they are items he is requesting, which is mostly junk food. Dining services brought in AM snack for pt, states he is not going to  eat it.   RN also reports pt has been difficult today. Will continue to encourage PO intake and attempt to find items pt will consume.  Average Meal Intake: 6/26-6/28: ~32% x 4 recorded meals (0-100%) 6/29-7/6: 48% intake x 6 recorded meals (15-90%)  Nutritionally Relevant Medications: Scheduled Meds:  feeding supplement  237 mL Oral TID BM   ferrous sulfate  325 mg Oral Q breakfast   folic acid  1 mg Oral Daily   megestrol  400 mg Oral Daily   multivitamin with minerals  1 tablet Oral Daily   polyethylene glycol  17 g Oral Daily   sodium bicarbonate  650 mg Oral TID   sodium chloride  1 g Oral BID WC   Vitamin D (Ergocalciferol)  50,000 Units Oral Q7 days   PRN Meds: bisacodyl, ondansetron   Labs Reviewed: Na 128 Mg 1.6 Vitamin D, 25: 29.48 (6/22)  Diet Order:   Diet Order             Diet regular Room service appropriate? Yes; Fluid consistency: Thin; Fluid restriction: 1500 mL Fluid  Diet effective now                  EDUCATION NEEDS:  Education needs have been addressed  Skin:  Skin Assessment: Reviewed RN Assessment  Last BM:  7/6 - type 3  Height:  Ht Readings from Last 1 Encounters:  03/30/21 6\' 5"  (1.956 m)   Weight:  Wt Readings from Last 1 Encounters:  04/14/21 51.5 kg   Ideal Body Weight:  94.5 kg  BMI:  Body mass index is 13.46 kg/m.  Estimated  Nutritional Needs:  Kcal:  2400-2600 kcal/d Protein:  130-150 g/d Fluid:  >2.5L/d  Ryan [name], RD, LDN Clinical Dietitian Pager on [place]

## 2021-04-15 DIAGNOSIS — R9431 Abnormal electrocardiogram [ECG] [EKG]: Secondary | ICD-10-CM

## 2021-04-15 LAB — TROPONIN I (HIGH SENSITIVITY)
Troponin I (High Sensitivity): 3 ng/L (ref <18)
Troponin I (High Sensitivity): 3 ng/L (ref <18)

## 2021-04-15 NOTE — Progress Notes (Signed)
PROGRESS NOTE    Ryan [name]  UYE:[id number] DOB: 17-Aug-1961 DOA: 03/30/2021 PCP: Pcp, No   Follow-up on metastatic prostate cancer. Brief Narrative:  Ryan [name] is a 60 y.o. male with medical history significant for history of polysubstance abuse including THC, cocaine, alcohol, remote history of tobacco use, history of right proximal femur inter trochanteric fracture status post right IM nail intertrochanteric fixation in 04/02/2021, does not follow-up with a PCP, presents to the emergency department for chief concerns of right hip pain.  Pain is chronic for several months, denies any trauma, difficulty ambulation due to pain. He also endorses an unintentional 35 pound weight loss in the last year, denies bright red blood per rectum and melena. ED work-up, hypotensive, saturating well on room air, Hb 5.6, WBC 6.2, platelet 121, serum sodium 126, potassium 3.9, chloride 97, bicarb 17, BUN 9, serum creatinine of 0.64, nonfasting blood glucose 86.  eGFR is greater than 60. AST 51, ALT 8. Alk phos elevated at 884. Upon arriving the hospital, patient was found to have L5 compression fracture, L3/4 severe stenosis.  She was also found to have widespread bony metastasis  from prostate cancer. Patient is a followed by oncology.  He needed SNF placement, not able to find accepting facility.  7/6-had bm today. No sob, cp 7/7- no  complaints this am. Going to try to eat his breakfast. 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  Prostate  cancer with widespread bony metastasis. L5 compression fracture. 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 Followed by oncology, condition stable. 7/7 he is not a surgical candidate per neurosurgery He is prescribed a brace, but not able to tolerate    2.  Severe symptomatic anemia. Folic acid deficiency. Thrombocytopenia.: H&H stable  Platelets improved  Continue ferrous sulfate  Monitor periodically     3.  Hypokalemia. Hypomagnesemia Hypophosphatemia. Hypocalcemia. Hyponatremia. Non-anion gap metabolic acidosis.: Patient on sodium bicarb sodium chloride tablets  Calcium low due to hypoalbuminemia  Continue to monitor      #4.  Cancer cachexia with severe protein calorie malnutrition. RD following, continue megestrol  Appetite better we will continue to monitor       DVT prophylaxis: Lovenox Code Status: Full Family Communication: none at bedside Disposition Plan:    Status is: Inpatient  Remains inpatient appropriate because:Unsafe d/c plan and Inpatient level of care appropriate due to severity of illness  Dispo: The patient is from: Home              Anticipated d/c is to: SNF              Patient currently is medically stable to d/c.   Difficult to place patient Yes        I/O last 3 completed shifts: In: 440 [P.O.:440] Out: 1200 [Urine:1200] No intake/output data recorded.     Consultants:  Oncology  Procedures: None  Antimicrobials: None   Subjective: No abd pain, sob, cp  Objective: Vitals:   04/14/21 1246 04/14/21 1555 04/14/21  2008 04/15/21 0300  BP:  111/70 (!) 101/58 121/74  Pulse:  78 77 80  Resp:  _0 Temp:  97.8 F (36.6 C) 98.8 F (37.1 C) (!) 97.2 F (36.2 C)  TempSrc:   Oral Tympanic  SpO2:  100% 100% 100%  Weight: 51.5 kg     Height:        Intake/Output Summary (Last 24 hours) at 04/15/2021 0804 Last data filed at 04/15/2021 0300 Gross per 24 hour  Intake 440 ml  Output 1000  ml  Net -560 ml   Filed Weights   04/01/21 1232 04/08/21 0800 04/14/21 1246  Weight: 54.7 kg 41.6 kg 51.5 kg    Examination: NAD, calm CTA no wheeze rales rhonchi's Regular S1-S2 no gallops Soft benign positive bowel sounds No edema Awake and alert and oriented, grossly intact   Data Reviewed: I have personally reviewed following labs and imaging studies  CBC: Recent Labs  Lab 04/14/21 0417  WBC 6.9  NEUTROABS 4.5  HGB 8.5*  HCT 25.1*  MCV 79.2*  PLT 680   Basic Metabolic Panel: Recent Labs  Lab 04/11/21 0415 04/14/21 0417  NA 127* 128*  K 4.2 4.7  CL 102 103  CO2 21* 19*  GLUCOSE 119* 86  BUN 9 11  CREATININE 0.63 0.57*  CALCIUM 7.5* 8.0*  MG 1.5* 1.6*  PHOS 2.7 3.1   GFR: Estimated Creatinine Clearance: 72.4 mL/min (A) (by C-G formula based on SCr of 0.57 mg/dL (L)). Liver Function Tests: No results for input(s): AST, ALT, ALKPHOS, BILITOT, PROT, ALBUMIN in the last 168 hours. No results for input(s): LIPASE, AMYLASE in the last 168 hours. No results for input(s): AMMONIA in the last 168 hours. Coagulation Profile: No results for input(s): INR, PROTIME in the last 168 hours. Cardiac Enzymes: No results for input(s): CKTOTAL, CKMB, CKMBINDEX, TROPONINI in the last 168 hours. BNP (last 3 results) No results for input(s): PROBNP in the last 8760 hours. HbA1C: No results for input(s): HGBA1C in the last 72 hours. CBG: No results for input(s): GLUCAP in the last 168 hours. Lipid Profile: No results for input(s): CHOL, HDL, LDLCALC, TRIG, CHOLHDL, LDLDIRECT in the last 72 hours. Thyroid Function Tests: No results for input(s): TSH, T4TOTAL, FREET4, T3FREE, THYROIDAB in the last 72 hours. Anemia Panel: No results for input(s): VITAMINB12, FOLATE, FERRITIN, TIBC, IRON, RETICCTPCT in the last 72 hours. Sepsis Labs: No results for input(s): PROCALCITON, LATICACIDVEN in the last 168 hours.  No results found for this or any previous visit (from the past  240 hour(s)).       Radiology Studies: No results found.      Scheduled Meds:  enoxaparin (LOVENOX) injection  30 mg Subcutaneous Q24H   feeding supplement  237 mL Oral BID BM   ferrous sulfate  325 mg Oral Q breakfast   folic acid  1 mg Oral Daily   megestrol  400 mg Oral Daily   melatonin  5 mg Oral QHS   midodrine  10 mg Oral TID WC   multivitamin with minerals  1 tablet Oral Daily   polyethylene glycol  17 g Oral Daily   sodium bicarbonate  650 mg Oral TID   sodium chloride  1 g Oral BID WC   Vitamin D (Ergocalciferol)  50,000 Units Oral Q7 days   Continuous Infusions:   LOS: 16 days    Time spent: 30 min with >50 % on ococ    Nolberto Hanlon, MD [facility]  To contact the attending provider between 7A-7P or the covering provider during after hours 7P-7A, please log into the web site [email or web address] and access using universal [place] password for that web site. If you do not have the password, please call the hospital operator.  04/15/2021, 8:04 AM

## 2021-04-15 NOTE — Progress Notes (Signed)
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 TLSO recommended from nurosurgery and devliered to room.    PT Comments    Pt in bed.  Stated he has been up, walked 4 laps in room and sat in chair today.  Unsure of truth of this as chair not set up and he was not OOB when I checked on him earlier this morning eating breakfast in bed.  He initially declines therapy but after much encouragement.  He self directs exercises in bed and needs verbal and tactile cues for proper exercise technique and reps.  He does agree to EOB after much encouragement.  Transitions to sitting with min a x 1 and cues for log roll.  Steady in sitting.  He does stand briefly with min guard to RW with some small stepping in place but resists gait.  Asking to sit then lay back down.  When questioned why he stated he wanted to "nap."  Takes extra effort to get pt to participate but he does do fairly well with minimal assist once he agrees.  He remains limited by general fatigue, weakness and general motivation.    Follow Up Recommendations  SNF     Equipment Recommendations  None recommended by PT    Recommendations for Other Services       Precautions / Restrictions Precautions Precautions: Fall Required Braces or Orthoses: Spinal Brace Spinal Brace: Thoracolumbosacral orthotic Restrictions Weight Bearing Restrictions: No    Mobility  Bed Mobility Overal bed mobility: Needs Assistance Bed Mobility: Supine to Sit;Sit to Supine Rolling:  Supervision Sidelying to sit: Min assist   Sit to supine: Min guard        Transfers Overall transfer level: Needs assistance Equipment used: Rolling walker (2 wheeled) Transfers: Sit to/from Stand Sit to Stand: Min assist;From elevated surface            Ambulation/Gait             General Gait Details: refused gait.  stated he walked earlier but I am unsure if accurate.   Stairs             Wheelchair Mobility    Modified Rankin (Stroke Patients Only)       Balance Overall balance assessment: Needs assistance Sitting-balance support: Feet supported;Bilateral upper extremity supported Sitting balance-Leahy Scale: Good     Standing balance support: Bilateral upper extremity supported;During functional activity Standing balance-Leahy Scale: Fair Standing balance comment: high fall risk with poor insight of deficits                            Cognition Arousal/Alertness: Awake/alert Behavior During Therapy: WFL for tasks assessed/performed Overall Cognitive Status: Within Functional Limits for tasks assessed                                        Exercises      General Comments        Pertinent Vitals/Pain  Pain Assessment: Faces Faces Pain Scale: Hurts little more Pain Location: general all over Pain Descriptors / Indicators: Sore;Aching Pain Intervention(s): Limited activity within patient's tolerance;Monitored during session;Repositioned    Home Living                      Prior Function            PT Goals (current goals can now be found in the care plan section) Progress towards PT goals: Progressing toward goals    Frequency    Min 2X/week      PT Plan Current plan remains appropriate    Co-evaluation              AM-PAC PT "6 Clicks" Mobility   Outcome Measure  Help needed turning from your back to your side while in a flat bed without using bedrails?: A Little Help  needed moving from lying on your back to sitting on the side of a flat bed without using bedrails?: A Little Help needed moving to and from a bed to a chair (including a wheelchair)?: A Little Help needed standing up from a chair using your arms (e.g., wheelchair or bedside chair)?: A Little Help needed to walk in hospital room?: A Little Help needed climbing 3-5 steps with a railing? : A Lot 6 Click Score: 17    End of Session Equipment Utilized During Treatment: Gait belt Activity Tolerance: Patient limited by fatigue;Other (comment) Patient left: in bed;with call bell/phone within reach;with bed alarm set Nurse Communication: Mobility status PT Visit Diagnosis: Unsteadiness on feet (R26.81);Muscle weakness (generalized) (M62.81);Difficulty in walking, not elsewhere classified (R26.2)     Time: 7915-0413 PT Time Calculation (min) (ACUTE ONLY): 18 min  Charges:  $Therapeutic Activity: 8-22 mins                    [name], PTA 04/15/21, 11:03 AM , 10:58 AM

## 2021-04-16 NOTE — Progress Notes (Signed)
Occupational Therapy Treatment Patient Details Name: Ryan [name] MRN: [id number] DOB: Sep 15, 1961 Today's Date: 04/16/2021    History of present illness Patient is a 60 year old male with prostate cancer with widespread bony metastasis, L5 fracture and L3/4 severe stenosis with LE weakness, MRI brain and C/T/L spine shows extensive metastasis in the skull base and whole spine, questionable acute CVA among other things,  Hypocalcemia most likely due to nutritional deficiency and associated hypoalbuminemia, hyoptension. TLSO recommended from nurosurgery and devliered to room.   OT comments  Pt not agreeable to participating in occupational therapy this date despite OT and family member's encouragement. When OT went to leave, pt stated that he wanted to talk to this [name] about a "few things". And spent copious time stating why he couldn't work with OT at this time. At this point, OT spent time educating pt re: importance of OOB activity, importance of therapy participation, importance of maximizing his time with therapy if he is stating that his goal is "to get stronger". Pt still not agreeable to getting OOB, but is then agreeable to attempting to come to long sitting. OT educates re: using his abdominal muscles to protect his spine. Pt with weak core strength requiring MIN A to come to long sitting x3 trials. Pt with good understanding of education including spine protection and importance of maintaining core strength with conservative measures in setting of spine precautions. Pt will require f/u as well increased mobilization participation if he is to meet his goal of getting stronger. Session very limited and essentially only one unit of actual functional participation despite time spent in pt room d/t his general avoidance of participation and effort as well as self-limiting behavior. Acute OT will only continue efforts should pt become more participatory and pt is aware via education completed today.    Follow Up Recommendations  SNF    Equipment Recommendations  Other (comment) (TBD)    Recommendations for Other Services      Precautions / Restrictions Precautions Precautions: Fall Required Braces or Orthoses: Spinal Brace Spinal Brace: Thoracolumbosacral orthotic Restrictions Weight Bearing Restrictions: No       Mobility Bed Mobility               General bed mobility comments: pt declines to get OOB, but agreeable to attempting to come to long sitting x3 with cues to use adbominals to protect his spine. Pt too weak to come to full long sitting, requires MIN A.    Transfers                 General transfer comment: deferred, pt not agreeable to OOB activity.    Balance                                           ADL either performed or assessed with clinical judgement   ADL                                               Vision Baseline Vision/History: No visual deficits Patient Visual Report: No change from baseline     Perception     Praxis      Cognition Arousal/Alertness: Awake/alert Behavior During Therapy: WFL for tasks assessed/performed Overall Cognitive Status: Within Functional  Limits for tasks assessed                                 General Comments: very hard to motivate, difficult to engage on any level, puts forth bare minimum effort despite education. Reports getting up and walking several times with nursing today although this does not appear to be the case.        Exercises Other Exercises Other Exercises: most time of session spent on education re: weighing benefits of participating or not participating with therapy/rehabilitative efforts. Ed re: importance of OOB activity for preventing further muscle atrophy or opportunistic infection. Importance of light core strengthening through improved posture so that his core muscles can protect his spine. Pt not agreeable to getting  OOB, but agreeable to atteming to come to long sitting, requires MIN A as his core is too weak.   Shoulder Instructions       General Comments      Pertinent Vitals/ Pain       Pain Assessment: Faces Faces Pain Scale: Hurts a little bit Pain Location: vague c/o upset stomach Pain Descriptors / Indicators: Discomfort Pain Intervention(s): Monitored during session  Home Living Family/patient expects to be discharged to:: Private residence Living Arrangements: Alone Available Help at Discharge: Available PRN/intermittently Type of Home: Apartment Home Access: Level entry     Home Layout: One level               Home Equipment: Environmental consultant - 2 wheels          Prior Functioning/Environment Level of Independence: Independent with assistive device(s)  Gait / Transfers Assistance Needed: patient reports he is Mod I for ambulation with rolling walker ADL's / Homemaking Assistance Needed: IND in ADL. Does not drive, as has no car. No longer employed, in the process of applying for disability       Frequency  Min 1X/week        Progress Toward Goals  OT Goals(current goals can now be found in the care plan section)  Progress towards OT goals: Progressing toward goals  Acute Rehab OT Goals Patient Stated Goal: to get stronger OT Goal Formulation: With patient Time For Goal Achievement: 04/27/21  Plan Discharge plan remains appropriate;Frequency needs to be updated    Co-evaluation                 AM-PAC OT "6 Clicks" Daily Activity     Outcome Measure   Help from another person eating meals?: A Little Help from another person taking care of personal grooming?: A Little Help from another person toileting, which includes using toliet, bedpan, or urinal?: A Lot Help from another person bathing (including washing, rinsing, drying)?: A Lot Help from another person to put on and taking off regular upper body clothing?: A Little Help from another person to put on  and taking off regular lower body clothing?: A Lot 6 Click Score: 15    End of Session    OT Visit Diagnosis: Unsteadiness on feet (R26.81);Muscle weakness (generalized) (M62.81) Pain - Right/Left: Right Pain - part of body: Hip   Activity Tolerance Other (comment) (self limiting)   Patient Left in bed;with call bell/phone within reach;with bed alarm set   Nurse Communication Other (comment)        Time: 9417-4081 OT Time Calculation (min): 9 min  Charges: OT General Charges $OT Visit: 1 Visit OT Treatments $Therapeutic Activity: 8-22  mins  Gerrianne Scale, [place], OTR/L ascom [phone] 04/16/21, 4:35 PM

## 2021-04-16 NOTE — Progress Notes (Addendum)
PROGRESS NOTE    Ryan [name]  DJS:[id number] DOB: [birth year] DOA: 03/30/2021 PCP: Pcp, No   Follow-up on metastatic prostate cancer. Brief Narrative:  Ryan [name] is a 60 y.o. male with medical history significant for history of polysubstance abuse including THC, cocaine, alcohol, remote history of tobacco use, history of right proximal femur inter trochanteric fracture status post right IM nail intertrochanteric fixation in 04/02/2021, does not follow-up with a PCP, presents to the emergency department for chief concerns of right hip pain.  Pain is chronic for several months, denies any trauma, difficulty ambulation due to pain. He also endorses an unintentional 35 pound weight loss in the last year, denies bright red blood per rectum and melena. ED work-up, hypotensive, saturating well on room air, Hb 5.6, WBC 6.2, platelet 121, serum sodium 126, potassium 3.9, chloride 97, bicarb 17, BUN 9, serum creatinine of 0.64, nonfasting blood glucose 86.  eGFR is greater than 60. AST 51, ALT 8. Alk phos elevated at 884. Upon arriving the hospital, patient was found to have L5 compression fracture, L3/4 severe stenosis.  She was also found to have widespread bony metastasis  from prostate cancer. Patient is a followed by oncology.  He needed SNF placement, not able to find accepting facility.  7/6-had bm today. No sob, cp 7/7- no  complaints this am. Going to try to eat his breakfast. Reports he drank 3 ensures a day usually 7/8 has no complaints this am.   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  Prostate cancer with widespread bony metastasis. L5 compression fracture. 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 Followed by oncology, condition stable. 7/8 not surgical candidate per neurosurgry Was prescribed a brace, but did not tolerate it    2.  Severe symptomatic anemia. Folic acid deficiency. Thrombocytopenia.: H/h stable. Platelets improved continue ferrous sulfate Monitor periodically Platelets improved  Continue ferrous sulfate  Monitor periodically     3.  Hypokalemia. Hypomagnesemia Hypophosphatemia. Hypocalcemia. Hyponatremia. Non-anion gap metabolic acidosis.: Pt on sodium bicarb sodium chloride tablets Calcium low due to hypoalbuminemia Monitor periodically      #4.  Cancer cachexia with severe protein calorie malnutrition. RD following, continue megestrol  Appetite better we will continue to monitor       DVT prophylaxis: Lovenox Code Status: Full Family Communication: none at bedside Disposition Plan:    Status is: Inpatient  Remains inpatient appropriate because:Unsafe d/c plan and Inpatient level of care appropriate due to severity of illness  Dispo: The patient is from: Home              Anticipated d/c is to: SNF              Patient currently is medically stable to d/c.   Difficult to place patient Yes        I/O last 3 completed shifts: In: 240 [P.O.:240] Out: 2300 [Urine:2300] No intake/output data recorded.     Consultants:  Oncology  Procedures: None  Antimicrobials: None   Subjective: No sob, cp, abd pain  Objective: Vitals:  04/15/21 1642 04/15/21 2015 04/16/21 0101 04/16/21 0408  BP: 131/79 126/67 (!) 116/57 (!) 104/59  Pulse: 78 87 83 86  Resp: $Remo'16 18 18 18  'tOAMK$ Temp: 98.4 F (36.9 C) 98.7 F (37.1 C) 98.3 F (36.8 C) 98.6 F (37 C)  TempSrc: Oral Oral Oral Oral  SpO2: 100% 100% 100% 100%  Weight:      Height:        Intake/Output Summary (Last 24  hours) at 04/16/2021 0801 Last data filed at 04/16/2021 0406 Gross per 24 hour  Intake 240 ml  Output 1600 ml  Net -1360 ml   Filed Weights   04/01/21 1232 04/08/21 0800 04/14/21 1246  Weight: 54.7 kg 41.6 kg 51.5 kg    Examination: NAD, calm CTA no wheeze rales rhonchi's Regular S1-S2 no gallops Soft benign positive bowel sounds No edema Awake and alert and oriented, grossly intact   Data Reviewed: I have personally reviewed following labs and imaging studies  CBC: Recent Labs  Lab 04/14/21 0417  WBC 6.9  NEUTROABS 4.5  HGB 8.5*  HCT 25.1*  MCV 79.2*  PLT 914   Basic Metabolic Panel: Recent Labs  Lab 04/11/21 0415 04/14/21 0417  NA 127* 128*  K 4.2 4.7  CL 102 103  CO2 21* 19*  GLUCOSE 119* 86  BUN 9 11  CREATININE 0.63 0.57*  CALCIUM 7.5* 8.0*  MG 1.5* 1.6*  PHOS 2.7 3.1   GFR: Estimated Creatinine Clearance: 72.4 mL/min (A) (by C-G formula based on SCr of 0.57 mg/dL (L)). Liver Function Tests: No results for input(s): AST, ALT, ALKPHOS, BILITOT, PROT, ALBUMIN in the last 168 hours. No results for input(s): LIPASE, AMYLASE in the last 168 hours. No results for input(s): AMMONIA in the last 168 hours. Coagulation Profile: No results for input(s): INR, PROTIME in the last 168 hours. Cardiac Enzymes: No results for input(s): CKTOTAL, CKMB, CKMBINDEX, TROPONINI in the last 168 hours. BNP (last 3 results) No results for input(s): PROBNP in the last 8760 hours. HbA1C: No results for input(s): HGBA1C in the last 72 hours. CBG: No results for input(s): GLUCAP in the last 168 hours. Lipid Profile: No results for input(s): CHOL, HDL, LDLCALC, TRIG, CHOLHDL, LDLDIRECT in the last 72 hours. Thyroid Function Tests: No results for input(s): TSH, T4TOTAL, FREET4, T3FREE, THYROIDAB in the last 72 hours. Anemia Panel: No results for input(s): VITAMINB12, FOLATE, FERRITIN, TIBC, IRON, RETICCTPCT in the last 72 hours. Sepsis Labs: No results for input(s):  PROCALCITON, LATICACIDVEN in the last 168 hours.  No results found for this or any previous visit (from the past 240 hour(s)).       Radiology Studies: No results found.      Scheduled Meds:  enoxaparin (LOVENOX) injection  30 mg Subcutaneous Q24H   feeding supplement  237 mL Oral BID BM   ferrous sulfate  325 mg Oral Q breakfast   folic acid  1 mg Oral Daily   megestrol  400 mg Oral Daily   melatonin  5 mg Oral QHS   midodrine  10 mg Oral TID WC   multivitamin with minerals  1 tablet Oral Daily   polyethylene glycol  17 g Oral Daily   sodium bicarbonate  650 mg Oral TID   sodium chloride  1 g Oral BID WC   Vitamin D (Ergocalciferol)  50,000 Units Oral Q7 days   Continuous Infusions:   LOS: 17 days    Time spent: 30 min with >50 % on ococ  Nolberto Hanlon, MD [facility]   To contact the attending provider between 7A-7P or the covering provider during after hours 7P-7A, please log into the web site [email or web address] and access using universal [place] password for that web site. If you do not have the password, please call the hospital operator.  04/16/2021, 8:01 AM

## 2021-04-17 NOTE — Progress Notes (Signed)
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 TLSO recommended from nurosurgery and devliered to room.   OT comments  Pt with improved tolerance for OT tx this date. OT engaged pt in transfer to [facility] for BM and pt requires MOD A for peri care in standing with RW for balance. Pt requires MIN A/CGA for ADL transfers. Pt with limited standing tolerance for LB ADLs. OT engages pt in seated UB bathing and dressing tasks with MIN A and encouragement/education re: importance of pt completing as much for himself as possible to prevent further atrophy. While pt generally still requiring MAX encouragement to participate, he is overall more agreeable today. Pt left in chair with all needs met and in reach. RN and CNA notified that pt up to chair, BM during session, and needing bed change. Will continue to follow.    Follow Up Recommendations  SNF    Equipment Recommendations  Other (comment);3 in 1 bedside commode;Tub/shower seat (2ww)    Recommendations for Other Services      Precautions / Restrictions Precautions Precautions: Fall Required Braces or Orthoses: Spinal Brace Spinal Brace: Thoracolumbosacral orthotic Restrictions Weight Bearing Restrictions: No Other Position/Activity Restrictions: pt refusing brace often, requires increased time and encouragement, low tolerance/threshold for wearing       Mobility Bed Mobility Overal bed mobility: Needs Assistance Bed Mobility: Supine to Sit     Supine to sit: Min assist;HOB  elevated     General bed mobility comments: increased time    Transfers Overall transfer level: Needs assistance Equipment used: Rolling walker (2 wheeled) Transfers: Sit to/from [facility] Sit to Stand: Min assist Stand pivot transfers: Min assist       General transfer comment: increased time, effort, encouragement to participate    Balance Overall balance assessment: Needs assistance Sitting-balance support: Feet supported;Bilateral upper extremity supported Sitting balance-Leahy Scale: Good     Standing balance support: Bilateral upper extremity supported;During functional activity Standing balance-Leahy Scale: Fair Standing balance comment: requires UE support                           ADL either performed or assessed with clinical judgement   ADL Overall ADL's : Needs assistance/impaired     Grooming: Wash/dry hands;Wash/dry face;Set up;Sitting   Upper Body Bathing: Minimal assistance;Sitting Upper Body Bathing Details (indicate cue type and reason): cues/encouragement to perform as much of task on his own that he can Lower Body Bathing: Minimal assistance;Moderate assistance;Sit to/from stand Lower Body Bathing Details (indicate cue type and reason): pt able to sustain stand wtih RW for support and requires MOD A for actual compeltion of LB bathing. Pt able to wash bottom portion of LEs in sitting wiht MIN A Upper Body Dressing : Minimal assistance;Sitting   Lower Body Dressing: Minimal assistance;Sit to/from stand Lower Body Dressing Details (indicate cue type and reason): to perform clothg mgt over hips Toilet Transfer: BSC;Minimal assistance;RW   Toileting- Clothing Manipulation and Hygiene: Moderate assistance;Sit to/from stand Toileting - Clothing Manipulation Details (indicate cue type and reason): for posterior  peri care after BM. Cues for thorough completion             Vision Patient Visual Report: No change from  baseline     Perception     Praxis      Cognition Arousal/Alertness: Awake/alert Behavior During Therapy: WFL for tasks assessed/performed Overall Cognitive Status: Within Functional Limits for tasks assessed                                 General Comments: very hard to motivate, difficult to engage on any level, puts forth bare minimum effort despite education. Reports getting up and walking several times with nursing today although this does not appear to be the case.        Exercises Other Exercises Other Exercises: OT facilitates pt participation in bathing, dressing and toileting tasks   Shoulder Instructions       General Comments      Pertinent Vitals/ Pain       Pain Assessment: Faces Faces Pain Scale: No hurt  Home Living                                          Prior Functioning/Environment              Frequency  Min 1X/week        Progress Toward Goals  OT Goals(current goals can now be found in the care plan section)  Progress towards OT goals: Progressing toward goals  Acute Rehab OT Goals Patient Stated Goal: to get stronger OT Goal Formulation: With patient Time For Goal Achievement: 04/27/21  Plan Discharge plan remains appropriate;Frequency needs to be updated    Co-evaluation                 AM-PAC OT "6 Clicks" Daily Activity     Outcome Measure   Help from another person eating meals?: A Little Help from another person taking care of personal grooming?: A Little Help from another person toileting, which includes using toliet, bedpan, or urinal?: A Lot Help from another person bathing (including washing, rinsing, drying)?: A Lot Help from another person to put on and taking off regular upper body clothing?: A Little Help from another person to put on and taking off regular lower body clothing?: A Lot 6 Click Score: 15    End of Session Equipment Utilized During Treatment: Gait  belt  OT Visit Diagnosis: Unsteadiness on feet (R26.81);Muscle weakness (generalized) (M62.81)   Activity Tolerance Other (comment) (self limiting)   Patient Left in chair;with call bell/phone within reach   Nurse Communication Mobility status;Other (comment) (notified CNA of need for bed change)        Time: 0315-9458 OT Time Calculation (min): 41 min  Charges: OT General Charges $OT Visit: 1 Visit OT Treatments $Self Care/Home Management : 23-37 mins $Therapeutic Activity: 8-22 mins  Gerrianne Scale, MS, OTR/L ascom [phone] 04/17/21, 2:26 PM

## 2021-04-17 NOTE — Progress Notes (Signed)
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 Brief Narrative:  Ryan [name] is a 60 y.o. male with medical history significant for history of polysubstance abuse including THC, cocaine, alcohol, remote history of tobacco use, history of right proximal femur inter trochanteric fracture status post right IM nail intertrochanteric fixation in 04/02/2021, does not follow-up with a PCP, presents to the emergency department for chief concerns of right hip pain.  Pain is chronic for several months, denies any trauma, difficulty ambulation due to pain. He also endorses an unintentional 35 pound weight loss in the last year, denies bright red blood per rectum and melena. ED work-up, hypotensive, saturating well on room air, Hb 5.6, WBC 6.2, platelet 121, serum sodium 126, potassium 3.9, chloride 97, bicarb 17, BUN 9, serum creatinine of 0.64, nonfasting blood glucose 86.  eGFR is greater than 60. AST 51, ALT 8. Alk phos elevated at 884. Upon arriving the hospital, patient was found to have L5 compression fracture, L3/4 severe stenosis.  She was also found to have widespread bony metastasis  from prostate cancer. Patient is a followed by oncology.  He needed SNF placement, not able to find accepting facility.  7/6-had bm today. No sob, cp 7/7- no  complaints this am. Going to try to eat his breakfast. Reports he drank 3 ensures a day usually 7/8 has no complaints this am.  7/9 patient is sleepy.  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  Prostate cancer with widespread bony metastasis. L5 compression fracture. 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 Followed by oncology, condition stable. 7/9 nonsurgical candidate per neurosurgery Was prescribed a brace, but did not tolerated    2.  Severe symptomatic anemia. Folic acid deficiency. Thrombocytopenia.: H&H stable Platelets improved Continue ferrous sulfate Monitor periodically    3.  Hypokalemia. Hypomagnesemia Hypophosphatemia. Hypocalcemia. Hyponatremia. Non-anion gap metabolic acidosis.: Patient was on sodium bicarb and sodium chloride tablets  Calcium low due to hypovolemic albuminemia  Monitor periodically  Encouraged p.o. intake      #4.  Cancer cachexia with severe protein calorie malnutrition. RD following, continue megestrol  Appetite improving slowly Encourage p.o. intake      DVT prophylaxis: Lovenox Code Status: Full Family Communication: none at bedside Disposition Plan:    Status is: Inpatient  Remains inpatient appropriate because: Unsafe discharge Dispo: The patient is from: Home              Anticipated d/c is to: SNF              Patient currently is medically stable to d/c.   Difficult to place patient Yes        I/O last 3 completed shifts: In: 240 [P.O.:240] Out: 1350 [Urine:1350] No intake/output data recorded.     Consultants:  Oncology  Procedures: None  Antimicrobials: None   Subjective: Denies abdominal pain, chest pain  or shortness of breath  Objective: Vitals:   04/16/21 1239 04/16/21 1600 04/16/21 2140 04/17/21 0500  BP: 112/81 (!) 88/58 123/74 123/62  Pulse: 85  83 85  Resp: $Remo'18 18 18 18  'omvjk$ Temp: 98.1 F (36.7 C) 98 F (36.7 C) 98.6 F (37 C) 98.4 F (36.9 C)  TempSrc: Oral Oral  Oral  SpO2: 100% 100% 100% 100%   Weight:      Height:        Intake/Output Summary (Last 24 hours) at 04/17/2021 0809 Last data filed at 04/17/2021 0324 Gross per 24 hour  Intake 240 ml  Output 1050 ml  Net -810 ml   Filed Weights   04/01/21 1232 04/08/21 0800 04/14/21 1246  Weight: 54.7 kg 41.6 kg 51.5 kg    Examination: Calm, NAD CTA no wheeze rales rhonchi's Regular S1-S2 Soft benign positive bowel sounds No edema   Data Reviewed: I have personally reviewed following labs and imaging studies  CBC: Recent Labs  Lab 04/14/21 0417  WBC 6.9  NEUTROABS 4.5  HGB 8.5*  HCT 25.1*  MCV 79.2*  PLT 427   Basic Metabolic Panel: Recent Labs  Lab 04/11/21 0415 04/14/21 0417  NA 127* 128*  K 4.2 4.7  CL 102 103  CO2 21* 19*  GLUCOSE 119* 86  BUN 9 11  CREATININE 0.63 0.57*  CALCIUM 7.5* 8.0*  MG 1.5* 1.6*  PHOS 2.7 3.1   GFR: Estimated Creatinine Clearance: 72.4 mL/min (A) (by C-G formula based on SCr of 0.57 mg/dL (L)). Liver Function Tests: No results for input(s): AST, ALT, ALKPHOS, BILITOT, PROT, ALBUMIN in the last 168 hours. No results for input(s): LIPASE, AMYLASE in the last 168 hours. No results for input(s): AMMONIA in the last 168 hours. Coagulation Profile: No results for input(s): INR, PROTIME in the last 168 hours. Cardiac Enzymes: No results for input(s): CKTOTAL, CKMB, CKMBINDEX, TROPONINI in the last 168 hours. BNP (last 3 results) No results for input(s): PROBNP in the last 8760 hours. HbA1C: No results for input(s): HGBA1C in the last 72 hours. CBG: No results for input(s): GLUCAP in the last 168 hours. Lipid Profile: No results for input(s): CHOL, HDL, LDLCALC, TRIG, CHOLHDL, LDLDIRECT in the last 72 hours. Thyroid Function Tests: No results for input(s): TSH, T4TOTAL, FREET4, T3FREE, THYROIDAB in the last 72 hours. Anemia Panel: No results for input(s): VITAMINB12, FOLATE, FERRITIN, TIBC, IRON, RETICCTPCT in the last 72 hours. Sepsis Labs: No results for input(s):  PROCALCITON, LATICACIDVEN in the last 168 hours.  No results found for this or any previous visit (from the past 240 hour(s)).       Radiology Studies: No results found.      Scheduled Meds:  enoxaparin (LOVENOX) injection  30 mg Subcutaneous Q24H   feeding supplement  237 mL Oral BID BM   ferrous sulfate  325 mg Oral Q breakfast   folic acid  1 mg Oral Daily   megestrol  400 mg Oral Daily   melatonin  5 mg Oral QHS   midodrine  10 mg Oral TID WC   multivitamin with minerals  1 tablet Oral Daily   polyethylene glycol  17 g Oral Daily   sodium bicarbonate  650 mg Oral TID   sodium chloride  1 g Oral BID WC   Vitamin D (Ergocalciferol)  50,000 Units Oral Q7 days   Continuous Infusions:   LOS: 18 days    Time spent: 30 min with >50 % on ococ  Nolberto Hanlon, MD [facility]   To contact the attending provider between 7A-7P or the covering provider during after hours 7P-7A, please log into the web site [email or web address] and access using universal [place] password for that web site. If you do not have the password, please call the hospital operator.  04/17/2021, 8:09 AM

## 2021-04-17 NOTE — Progress Notes (Signed)
PT Cancellation Note  Patient Details Name: Ryan [name] MRN: [id number] DOB: [birth year]   Cancelled Treatment:     PT attempt. Pt politely refused. " I already did my therapy today." (Referring to OT) unwilling for session at this time. Will continue to follow an progress as able per current POC.    [name] 04/17/2021, 3:43 PM

## 2021-04-18 NOTE — Progress Notes (Signed)
PT Cancellation Note  Patient Details Name: Ryan [name] MRN: [id number] DOB: 04-03-1961   Cancelled Treatment:    Reason Eval/Treat Not Completed: Pain limiting ability to participate  Offered x 2 this am.  Initially pt refused stating it was too early.  Returned later per his request.  Pt in bed holding upper right quadrant.  Stated RN was aware of pain.  Stated he is unable to participate at this time.  Will continue at a later time/date.   [name] 04/18/2021, 11:32 AM

## 2021-04-18 NOTE — Progress Notes (Signed)
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 Brief Narrative:  Ryan [name] is a 60 y.o. male with medical history significant for history of polysubstance abuse including THC, cocaine, alcohol, remote history of tobacco use, history of right proximal femur inter trochanteric fracture status post right IM nail intertrochanteric fixation in 04/02/2021, does not follow-up with a PCP, presents to the emergency department for chief concerns of right hip pain.  Pain is chronic for several months, denies any trauma, difficulty ambulation due to pain. He also endorses an unintentional 35 pound weight loss in the last year, denies bright red blood per rectum and melena. ED work-up, hypotensive, saturating well on room air, Hb 5.6, WBC 6.2, platelet 121, serum sodium 126, potassium 3.9, chloride 97, bicarb 17, BUN 9, serum creatinine of 0.64, nonfasting blood glucose 86.  eGFR is greater than 60. AST 51, ALT 8. Alk phos elevated at 884. Upon arriving the hospital, patient was found to have L5 compression fracture, L3/4 severe stenosis.  She was also found to have widespread bony metastasis  from prostate cancer. Patient is a followed by oncology.  He needed SNF placement, not able to find accepting facility.  7/6-had bm today. No sob, cp 7/7- no  complaints this am. Going to try to eat his breakfast. Reports he drank 3 ensures a day usually 7/8 has no complaints this am.  7/9 patient is sleepy.  He states he wants to sleep a little bit longer before eating breakfast has no complaints 7/10 eating breakfast this am. No complaints of anything.hypotensive this am, but asx. 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  Prostate cancer with widespread bony metastasis. L5 compression fracture. 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 Followed by oncology, condition stable. 7/10 nonsurgical candidate per neurosurgery  Was prescribed a brace, but did not tolerate       2.  Severe symptomatic anemia. Folic acid deficiency. Thrombocytopenia.: [place] stable  Platelets have improved  Continue ferrous sulfate  Will check labs in a.m.      3.  Hypokalemia. Hypomagnesemia Hypophosphatemia. Hypocalcemia. Hyponatremia. Non-anion gap metabolic acidosis.: Patient was on sodium bicarb and sodium chloride tablets  Calcium low due to hypoalbuminemia Will ck am labs Encourage po intake       #4.  Cancer cachexia with severe protein calorie malnutrition. RD following, continue megestrol  7/10- improving appetite Continued encouraging po intake       DVT prophylaxis: Lovenox Code Status: Full Family Communication: none at bedside Disposition Plan:    Status is: Inpatient  Remains inpatient appropriate because: Unsafe discharge Dispo: The patient is from: Home              Anticipated d/c is to: SNF              Patient currently is medically stable to d/c.   Difficult to place patient Yes        I/O last 3 completed shifts:  In: 240 [P.O.:240] Out: 0076 [Urine:1375] No intake/output data recorded.     Consultants:  Oncology  Procedures: None  Antimicrobials: None   Subjective: No sob, cp. No dizziness  Objective: Vitals:   04/17/21 1922 04/18/21 0013 04/18/21 0558 04/18/21 0739  BP: 106/71 (!) 97/54 109/61 (!) 88/53  Pulse: 88 82 96 87  Resp: _0 Temp: 98.9 F (37.2 C) 97.6 F (36.4 C)  98.5 F (36.9 C) 97.9 F (36.6 C)  TempSrc:  Oral Oral   SpO2: 100% 100% 99% 100%  Weight:      Height:        Intake/Output Summary (Last 24 hours) at 04/18/2021 0819 Last data filed at 04/17/2021 2230 Gross per 24 hour  Intake --  Output 1025 ml  Net -1025 ml   Filed Weights   04/01/21 1232 04/08/21 0800 04/14/21 1246  Weight: 54.7 kg 41.6 kg 51.5 kg    Examination: Calm, nad, in bed. Cta no r/r/w Regular S1-S2 no gallops Soft benign positive bowel sounds No edema   Data Reviewed: I have personally reviewed following labs and imaging studies  CBC: Recent Labs  Lab 04/14/21 0417  WBC 6.9  NEUTROABS 4.5  HGB 8.5*  HCT 25.1*  MCV 79.2*  PLT 226   Basic Metabolic Panel: Recent Labs  Lab 04/14/21 0417  NA 128*  K 4.7  CL 103  CO2 19*  GLUCOSE 86  BUN 11  CREATININE 0.57*  CALCIUM 8.0*  MG 1.6*  PHOS 3.1   GFR: Estimated Creatinine Clearance: 72.4 mL/min (A) (by C-G formula based on SCr of 0.57 mg/dL (L)). Liver Function Tests: No results for input(s): AST, ALT, ALKPHOS, BILITOT, PROT, ALBUMIN in the last 168 hours. No results for input(s): LIPASE, AMYLASE in the last 168 hours. No results for input(s): AMMONIA in the last 168 hours. Coagulation Profile: No results for input(s): INR, PROTIME in the last 168 hours. Cardiac Enzymes: No results for input(s): CKTOTAL, CKMB, CKMBINDEX, TROPONINI in the last 168 hours. BNP (last 3 results) No results for input(s): PROBNP in the last 8760 hours. HbA1C: No results for input(s): HGBA1C in the last 72 hours. CBG: No results for input(s): GLUCAP in the last 168 hours. Lipid Profile: No results for input(s): CHOL, HDL, LDLCALC, TRIG, CHOLHDL, LDLDIRECT in the last 72 hours. Thyroid Function Tests: No results for input(s): TSH, T4TOTAL, FREET4, T3FREE, THYROIDAB in the last 72 hours. Anemia Panel: No results for input(s): VITAMINB12, FOLATE, FERRITIN, TIBC, IRON, RETICCTPCT in the last 72 hours. Sepsis  Labs: No results for input(s): PROCALCITON, LATICACIDVEN in the last 168 hours.  No results found for this or any previous visit (from the past 240 hour(s)).       Radiology Studies: No results found.      Scheduled Meds:  enoxaparin (LOVENOX) injection  30 mg Subcutaneous Q24H   feeding supplement  237 mL Oral BID BM   ferrous sulfate  325 mg Oral Q breakfast   folic acid  1 mg Oral Daily   megestrol  400 mg Oral Daily   melatonin  5 mg Oral QHS   midodrine  10 mg Oral TID WC   multivitamin with minerals  1 tablet Oral Daily   polyethylene glycol  17 g Oral Daily   sodium bicarbonate  650 mg Oral TID   sodium chloride  1 g Oral BID WC   Vitamin D (Ergocalciferol)  50,000 Units Oral Q7 days   Continuous Infusions:   LOS:  19 days    Time spent: 30 min with >50 % on ococ    Nolberto Hanlon, MD [facility]   To contact the attending provider between 7A-7P or the covering provider during after hours 7P-7A, please log into the web site [email or web address] and access using universal [place] password for that web site. If you do not have the password, please call the hospital operator.  04/18/2021, 8:19 AM

## 2021-04-19 LAB — CBC
HCT: 23.8 % — ABNORMAL LOW (ref 39.0–52.0)
Hemoglobin: 7.9 g/dL — ABNORMAL LOW (ref 13.0–17.0)
MCH: 26.6 pg (ref 26.0–34.0)
MCHC: 33.2 g/dL (ref 30.0–36.0)
MCV: 80.1 fL (ref 80.0–100.0)
Platelets: 204 10*3/uL (ref 150–400)
RBC: 2.97 MIL/uL — ABNORMAL LOW (ref 4.22–5.81)
RDW: 18 % — ABNORMAL HIGH (ref 11.5–15.5)
WBC: 6.4 10*3/uL (ref 4.0–10.5)
nRBC: 0 % (ref 0.0–0.2)

## 2021-04-19 LAB — BASIC METABOLIC PANEL
Anion gap: 5 (ref 5–15)
BUN: 11 mg/dL (ref 6–20)
CO2: 21 mmol/L — ABNORMAL LOW (ref 22–32)
Calcium: 7.6 mg/dL — ABNORMAL LOW (ref 8.9–10.3)
Chloride: 103 mmol/L (ref 98–111)
Creatinine, Ser: 0.49 mg/dL — ABNORMAL LOW (ref 0.61–1.24)
GFR, Estimated: >60 mL/min (ref >60)
Glucose, Bld: 99 mg/dL (ref 70–99)
Potassium: 4.2 mmol/L (ref 3.5–5.1)
Sodium: 129 mmol/L — ABNORMAL LOW (ref 135–145)

## 2021-04-19 MED ORDER — DICLOFENAC SODIUM 1 % EX GEL
2.0000 g | CUTANEOUS | Status: DC | PRN
  Administered 2021-04-19 – 2021-05-06 (×4): 2 g via TOPICAL
  Filled 2021-04-19: qty 100

## 2021-04-19 MED ORDER — ENOXAPARIN SODIUM 30 MG/0.3ML IJ SOSY
30.0000 mg | PREFILLED_SYRINGE | INTRAMUSCULAR | Status: DC
  Filled 2021-04-19 (×6): qty 0.3

## 2021-04-19 NOTE — Progress Notes (Signed)
PT Cancellation Note  Patient Details Name: Ryan [name] MRN: [id number] DOB: 1961/03/11   Cancelled Treatment:    Reason Eval/Treat Not Completed: Other (comment)  Attempted x 2 this am.  Initially declined asking me to return in 15-20 minutes so he could take "a quick nap."  Returned as requested and pt stating "You have the worst timing." Pt continued to state he was too tired and asking if I could return again in 20 minutes.  Reviewed prior encounter and that he had just asked the same.  He shook his head stating he could not do it today when I explained what was needed. Pt remains with decreased motivation.  Will continue to attempt to accommodate pt requests at a later time/date.   [name] 04/19/2021, 11:00 AM

## 2021-04-19 NOTE — Progress Notes (Signed)
Pt refusing to wear SCD's all day.

## 2021-04-19 NOTE — Progress Notes (Signed)
Small tedhose not available on unit. Will notify to obtain size

## 2021-04-19 NOTE — Plan of Care (Signed)
Pt noncompliant at time with care. Refuses medication. Refused lovenox. On call provider aware and provider changed time to 1000 so it will fall on attending hours. Pt is aware of risk but continues to refuse due to he states it causes hematoma. Pt has received prn oxycodone, robitussin, trazdone, vistaril this shift thus far.  Problem: Education: Goal: Knowledge of General Education information will improve Description: Including pain rating scale, medication(s)/side effects and non-pharmacologic comfort measures Outcome: Not Progressing   Problem: Coping: Goal: Level of anxiety will decrease Outcome: Not Progressing  Pt continues to call out frequently to staff. Hyperverbal  Problem: Health Behavior/Discharge Planning: Goal: Ability to manage health-related needs will improve Outcome: Progressing   Problem: Clinical Measurements: Goal: Ability to maintain clinical measurements within normal limits will improve Outcome: Progressing Goal: Will remain free from infection Outcome: Progressing Goal: Diagnostic test results will improve Outcome: Progressing Goal: Respiratory complications will improve Outcome: Progressing Goal: Cardiovascular complication will be avoided Outcome: Progressing   Problem: Activity: Goal: Risk for activity intolerance will decrease Outcome: Progressing   Problem: Nutrition: Goal: Adequate nutrition will be maintained Outcome: Progressing   Problem: Elimination: Goal: Will not experience complications related to bowel motility Outcome: Progressing Goal: Will not experience complications related to urinary retention Outcome: Progressing   Problem: Pain Managment: Goal: General experience of comfort will improve Outcome: Progressing   Problem: Safety: Goal: Ability to remain free from injury will improve Outcome: Progressing   Problem: Skin Integrity: Goal: Risk for impaired skin integrity will decrease Outcome: Progressing

## 2021-04-19 NOTE — Progress Notes (Signed)
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 Brief Narrative:  Ryan [name] is a 60 y.o. male with medical history significant for history of polysubstance abuse including THC, cocaine, alcohol, remote history of tobacco use, history of right proximal femur inter trochanteric fracture status post right IM nail intertrochanteric fixation in 04/02/2021, does not follow-up with a PCP, presents to the emergency department for chief concerns of right hip pain.  Pain is chronic for several months, denies any trauma, difficulty ambulation due to pain. He also endorses an unintentional 35 pound weight loss in the last year, denies bright red blood per rectum and melena. ED work-up, hypotensive, saturating well on room air, Hb 5.6, WBC 6.2, platelet 121, serum sodium 126, potassium 3.9, chloride 97, bicarb 17, BUN 9, serum creatinine of 0.64, nonfasting blood glucose 86.  eGFR is greater than 60. AST 51, ALT 8. Alk phos elevated at 884. Upon arriving the hospital, patient was found to have L5 compression fracture, L3/4 severe stenosis.  She was also found to have widespread bony metastasis  from prostate cancer. Patient is a followed by oncology.  He needed SNF placement, not able to find accepting facility.  7/6-had bm today. No sob, cp 7/7- no  complaints this am. Going to try to eat his breakfast. Reports he drank 3 ensures a day usually 7/8 has no complaints this am.  7/9 patient is sleepy.  He states he wants to sleep a little bit longer before eating breakfast has no complaints 7/10 eating breakfast this am. No complaints of anything.hypotensive this am, but asx. Encouraged to hydrate 7/11-per nsg, pt refusing lovenox.  For me this am had no complaints. Was about to eat breakfast. Encouraged him to eat.     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  Prostate cancer with widespread bony metastasis. L5 compression fracture. 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 Followed by oncology, condition stable. 7/11 nonsurgical candidate per neurosurgery  Was prescribed a brace, but did not tolerate        2.  Severe symptomatic anemia. Folic acid deficiency. Thrombocytopenia.: Platelets stable.  H&H stable Continue ferrous sulfate Monitor periodically  3.  Hypokalemia. Hypomagnesemia Hypophosphatemia. Hypocalcemia. Hyponatremia. Non-anion gap metabolic acidosis.: Patient on sodium bicarb and sodium chloride tablets  Calcium low due to hypoalbuminemia  Encourage p.o. intake  Hyponatremia mildly improved, asymptomatic.  Potassium stable      #4.  Cancer cachexia with severe protein calorie malnutrition. RD following, continue megestrol  7/11 improving appetite Continue encouraging p.o. intake        DVT prophylaxis: Lovenox..>pt refusing. Will add scd Code Status: Full Family Communication: none at bedside Disposition Plan:    Status is: Inpatient  Remains inpatient appropriate because: Unsafe discharge Dispo: The patient is from: Home              Anticipated d/c is to: SNF              Patient currently  is medically stable to d/c.   Difficult to place patient Yes        I/O last 3 completed shifts: In: 75 [P.O.:55] Out: 1900 [Urine:1900] No intake/output data recorded.     Consultants:  Oncology  Procedures: None  Antimicrobials: None   Subjective: No sob, cp, abd pain, or  dizziness  Objective: Vitals:   04/18/21 1601 04/18/21 2002 04/19/21 0050 04/19/21 0422  BP: 111/80 (!) 90/51 96/65 103/63  Pulse: 85 87 92 94  Resp: $Remo'20 16 18 18  'jBAWw$ Temp: 97.6 F (36.4 C) 98.3 F (36.8 C) 98.5 F (36.9 C) 98.4 F (36.9 C)  TempSrc: Oral Oral Oral Oral  SpO2: 100% 99% 98% 100%  Weight:      Height:        Intake/Output Summary (Last 24 hours) at 04/19/2021 0812 Last data filed at 04/19/2021 0500 Gross per 24 hour  Intake 55 ml  Output 400 ml  Net -345 ml   Filed Weights   04/01/21 1232 04/08/21 0800 04/14/21 1246  Weight: 54.7 kg 41.6 kg 51.5 kg    Examination: Calm, nad Cta no w/r/r Regular s1/s2  Benign soft +bs No edema   Data Reviewed: I have personally reviewed following labs and imaging studies  CBC: Recent Labs  Lab 04/14/21 0417 04/19/21 0551  WBC 6.9 6.4  NEUTROABS 4.5  --   HGB 8.5* 7.9*  HCT 25.1* 23.8*  MCV 79.2* 80.1  PLT 192 619   Basic Metabolic Panel: Recent Labs  Lab 04/14/21 0417 04/19/21 0551  NA 128* 129*  K 4.7 4.2  CL 103 103  CO2 19* 21*  GLUCOSE 86 99  BUN 11 11  CREATININE 0.57* 0.49*  CALCIUM 8.0* 7.6*  MG 1.6*  --   PHOS 3.1  --    GFR: Estimated Creatinine Clearance: 72.4 mL/min (A) (by C-G formula based on SCr of 0.49 mg/dL (L)). Liver Function Tests: No results for input(s): AST, ALT, ALKPHOS, BILITOT, PROT, ALBUMIN in the last 168 hours. No results for input(s): LIPASE, AMYLASE in the last 168 hours. No results for input(s): AMMONIA in the last 168 hours. Coagulation Profile: No results for input(s): INR, PROTIME in the last 168 hours. Cardiac Enzymes: No results for input(s): CKTOTAL, CKMB, CKMBINDEX, TROPONINI in the last 168 hours. BNP (last 3 results) No results for input(s): PROBNP in the last 8760 hours. HbA1C: No results for input(s): HGBA1C in the last 72 hours. CBG: No results for input(s): GLUCAP in the last 168 hours. Lipid Profile: No results for input(s): CHOL, HDL, LDLCALC,  TRIG, CHOLHDL, LDLDIRECT in the last 72 hours. Thyroid Function Tests: No results for input(s): TSH, T4TOTAL, FREET4, T3FREE, THYROIDAB in the last 72 hours. Anemia Panel: No results for input(s): VITAMINB12, FOLATE, FERRITIN, TIBC, IRON, RETICCTPCT in the last 72 hours. Sepsis Labs: No results for input(s): PROCALCITON, LATICACIDVEN in the last 168 hours.  No results found for this or any previous visit (from the past 240 hour(s)).       Radiology Studies: No results found.      Scheduled Meds:  enoxaparin (LOVENOX) injection  30 mg Subcutaneous Q24H   feeding supplement  237 mL Oral BID BM   ferrous sulfate  325 mg Oral Q breakfast   folic acid  1 mg Oral Daily   megestrol  400 mg Oral Daily   melatonin  5 mg Oral QHS   midodrine  10 mg Oral TID WC   multivitamin with minerals  1 tablet Oral  Daily   polyethylene glycol  17 g Oral Daily   sodium bicarbonate  650 mg Oral TID   sodium chloride  1 g Oral BID WC   Vitamin D (Ergocalciferol)  50,000 Units Oral Q7 days   Continuous Infusions:   LOS: 20 days    Time spent: 30 min with >50 % on ococ    Nolberto Hanlon, MD [facility]   To contact the attending provider between 7A-7P or the covering provider during after hours 7P-7A, please log into the web site [email or web address] and access using universal [place] password for that web site. If you do not have the password, please call the hospital operator.  04/19/2021, 8:12 AM

## 2021-04-20 NOTE — Progress Notes (Signed)
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 Brief Narrative:  Ryan [name] is a 60 y.o. male with medical history significant for history of polysubstance abuse including THC, cocaine, alcohol, remote history of tobacco use, history of right proximal femur inter trochanteric fracture status post right IM nail intertrochanteric fixation in 04/02/2021, does not follow-up with a PCP, presents to the emergency department for chief concerns of right hip pain.  Pain is chronic for several months, denies any trauma, difficulty ambulation due to pain. He also endorses an unintentional 35 pound weight loss in the last year, denies bright red blood per rectum and melena. ED work-up, hypotensive, saturating well on room air, Hb 5.6, WBC 6.2, platelet 121, serum sodium 126, potassium 3.9, chloride 97, bicarb 17, BUN 9, serum creatinine of 0.64, nonfasting blood glucose 86.  eGFR is greater than 60. AST 51, ALT 8. Alk phos elevated at 884. Upon arriving the hospital, patient was found to have L5 compression fracture, L3/4 severe stenosis.  She was also found to have widespread bony metastasis  from prostate cancer. Patient is a followed by oncology.  He needed SNF placement, not able to find accepting facility.   Dr. Lupita Dawn week Pt refusing lovenox . Appetite better.  No other issues. Placement pending.    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  Prostate cancer with widespread bony  metastasis. L5 compression fracture. 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 Followed by oncology, condition stable. 7/12 nonsurgical candidate per neurosurgery  Was prescribed a brace, but did not tolerate     2.  Severe symptomatic anemia. Folic acid deficiency. Thrombocytopenia.: Platelets stable.  H&H stable  Continue ferrous sulfate  Monitor periodically    3.  Hypokalemia. Hypomagnesemia Hypophosphatemia. Hypocalcemia. Hyponatremia. Non-anion gap metabolic acidosis.: P patient on sodium bicarb and sodium chloride tablets  Low calcium due to hypoalbuminemia  Continue encouraging p.o. intake  Hyponatremia mildly improved, asymptomatic      #4.  Cancer cachexia with severe protein calorie malnutrition. RD following, continue megestrol  7/12 improving appetite Continue encouraging p.o. intake         DVT prophylaxis: Lovenox..>pt refusing. Will add scd Code Status: Full Family Communication: none at bedside Disposition Plan:    Status is: Inpatient  Remains inpatient appropriate because: Unsafe discharge Dispo: The patient is from: Home              Anticipated d/c is to: SNF              Patient currently is medically stable to d/c.   Difficult to place patient Yes        I/O last 3 completed shifts: In: 58 [P.O.:535] Out: 125 [Urine:125] Total I/O In: 120 [P.O.:120] Out: 70 [Urine:275]     Consultants:  Oncology  Procedures: None  Antimicrobials: None   Subjective: No complaints this AM.  No shortness of breath or chest pain.  Ambulated with PT in hallway  Objective: Vitals:  04/19/21 2025 04/19/21 2346 04/20/21 0749 04/20/21 1156  BP: 126/67 (!) 91/58 (!) 83/51 (!) 94/59  Pulse: 76 81 88 85  Resp: _0 Temp: 98.5 F (36.9 C) 98.3 F (36.8 C) 98 F (36.7 C) 98.4 F (36.9 C)  TempSrc: Oral Oral Oral Oral  SpO2: 100% 100% 99% 100%  Weight:      Height:        Intake/Output Summary  (Last 24 hours) at 04/20/2021 1220 Last data filed at 04/20/2021 1024 Gross per 24 hour  Intake 360 ml  Output 275 ml  Net 85 ml   Filed Weights   04/01/21 1232 04/08/21 0800 04/14/21 1246  Weight: 54.7 kg 41.6 kg 51.5 kg    Examination: Calm, NAD CTA no wheeze rales rhonchi's Regular S1-S2 no gallops Soft benign positive bowel sounds No edema  Data Reviewed: I have personally reviewed following labs and imaging studies  CBC: Recent Labs  Lab 04/14/21 0417 04/19/21 0551  WBC 6.9 6.4  NEUTROABS 4.5  --   HGB 8.5* 7.9*  HCT 25.1* 23.8*  MCV 79.2* 80.1  PLT 192 794   Basic Metabolic Panel: Recent Labs  Lab 04/14/21 0417 04/19/21 0551  NA 128* 129*  K 4.7 4.2  CL 103 103  CO2 19* 21*  GLUCOSE 86 99  BUN 11 11  CREATININE 0.57* 0.49*  CALCIUM 8.0* 7.6*  MG 1.6*  --   PHOS 3.1  --    GFR: Estimated Creatinine Clearance: 72.4 mL/min (A) (by C-G formula based on SCr of 0.49 mg/dL (L)). Liver Function Tests: No results for input(s): AST, ALT, ALKPHOS, BILITOT, PROT, ALBUMIN in the last 168 hours. No results for input(s): LIPASE, AMYLASE in the last 168 hours. No results for input(s): AMMONIA in the last 168 hours. Coagulation Profile: No results for input(s): INR, PROTIME in the last 168 hours. Cardiac Enzymes: No results for input(s): CKTOTAL, CKMB, CKMBINDEX, TROPONINI in the last 168 hours. BNP (last 3 results) No results for input(s): PROBNP in the last 8760 hours. HbA1C: No results for input(s): HGBA1C in the last 72 hours. CBG: No results for input(s): GLUCAP in the last 168 hours. Lipid Profile: No results for input(s): CHOL, HDL, LDLCALC, TRIG, CHOLHDL, LDLDIRECT in the last 72 hours. Thyroid Function Tests: No results for input(s): TSH, T4TOTAL, FREET4, T3FREE, THYROIDAB in the last 72 hours. Anemia Panel: No results for input(s): VITAMINB12, FOLATE, FERRITIN, TIBC, IRON, RETICCTPCT in the last 72 hours. Sepsis Labs: No results for input(s):  PROCALCITON, LATICACIDVEN in the last 168 hours.  No results found for this or any previous visit (from the past 240 hour(s)).       Radiology Studies: No results found.      Scheduled Meds:  enoxaparin (LOVENOX) injection  30 mg Subcutaneous Q24H   feeding supplement  237 mL Oral BID BM   ferrous sulfate  325 mg Oral Q breakfast   folic acid  1 mg Oral Daily   megestrol  400 mg Oral Daily   melatonin  5 mg Oral QHS   midodrine  10 mg Oral TID WC   multivitamin with minerals  1 tablet Oral Daily   polyethylene glycol  17 g Oral Daily   sodium bicarbonate  650 mg Oral TID   sodium chloride  1 g Oral BID WC   Vitamin D (Ergocalciferol)  50,000 Units Oral Q7 days   Continuous Infusions:   LOS: 21 days    Time spent: 30 min  with >50 % on ococ    Nolberto Hanlon, MD [facility]   To contact the attending provider between 7A-7P or the covering provider during after hours 7P-7A, please log into the web site [email or web address] and access using universal  password for that web site. If you do not have the password, please call the hospital operator.  04/20/2021, 12:20 PM

## 2021-04-20 NOTE — TOC Progression Note (Signed)
Transition of Care [facility]) - Progression Note    Patient Details  Name: Ryan [name] MRN: [id number] Date of Birth: 05-17-1961  [facility]) CM/SW [place], RN Phone Number: 04/20/2021, 11:02 AM  Clinical Narrative:   Patient continues to have no bed offers.  RNCM called [place] and [place] to inquire about LOG.  Awaiting response.         Expected Discharge Plan and Services                                                 Social Determinants of Health (SDOH) Interventions    Readmission Risk Interventions No flowsheet data found.

## 2021-04-20 NOTE — Progress Notes (Signed)
Nutrition Follow-up  DOCUMENTATION CODES:  Severe malnutrition in context of chronic illness, Underweight  INTERVENTION:  Encourage PO intake, continue current diet as ordered. Continue double protein portions with meals. Continue Ensure Enlive BID, each supplement provides 350 kcal and 20 grams of protein. Continue snacks. Continue MVI with minerals daily. Consider discussion regarding initiation of nutrition support.  NUTRITION DIAGNOSIS:  Severe Malnutrition related to cancer and cancer related treatments as evidenced by severe muscle depletion, severe fat depletion. - ongoing  GOAL:  Patient will meet greater than or equal to 90% of their needs - not meeting  MONITOR:  PO intake, Weight trends  REASON FOR ASSESSMENT:  Consult Assessment of nutrition requirement/status  ASSESSMENT:  60 y.o. male presented to ED for intermittent right lateral hip pain that is been ongoing for the past several weeks. Pt had surgery 10 months ago but reports he has not followed-up for pain. Imaging in ED concerning for marrow infiltrative process. PMH relevant for polysubstance abuse.  Found to have prostate cancer with widespread bony metastasis.  Pending discharge to SNF.  MD reports pt reported better appetite today. Pt stably eating about 50% of meals per average meal intake.  Admit wt: 54.7 kg Current wt: 51.5 kg Pt due for new weight tomorrow.  Given pt's refusal of medications and supplements, likely that pt will refuse nutrition support, but RD recommends this, as pt is unable to meet at least 50% of his nutrition needs PO.  Average Meal Intake: 6/26-6/28: ~32% x 4 recorded meals (0-100%) 6/29-7/6: 48% intake x 6 recorded meals (15-90%) 7/7-7/12: 47% intake x 6 recorded meals (0-100%)  Medications: EE BID (refused), ferrous sulfate (refused), folic acid (refused), Megace (refused), midodrine TID, MVI with minerals (refused), miralax (refused), Na Bicarb TID, Vitamin D 50000 units  weekly, oxycodone PRN (given once today)  Labs: reviewed; Na 129  Diet Order:   Diet Order             Diet regular Room service appropriate? Yes; Fluid consistency: Thin; Fluid restriction: 1500 mL Fluid  Diet effective now                  EDUCATION NEEDS:  Education needs have been addressed  Skin:  Skin Assessment: Reviewed RN Assessment  Last BM:  04/19/21 - Type 4, brown/black, small  Height:  Ht Readings from Last 1 Encounters:  03/30/21 6\' 5"  (1.956 m)   Weight:  Wt Readings from Last 1 Encounters:  04/14/21 51.5 kg   Ideal Body Weight:  94.5 kg  BMI:  Body mass index is 13.46 kg/m.  Estimated Nutritional Needs:  Kcal:  2400-2600 kcal/d Protein:  130-150 g/d Fluid:  >2.5L/d  Derrel Nip, RD, LDN (she/her/hers) Registered Dietitian I After-Hours/Weekend Pager # in [place]

## 2021-04-20 NOTE — Progress Notes (Signed)
   04/20/21 1427  Clinical Encounter Type  Visited With Patient  Visit Type Follow-up  Referral From Nurse  Consult/Referral To Chaplain  Spiritual Encounters  Spiritual Needs Emotional  Chaplain Melanye Hiraldo responded to a requiest from Pt's Case Manager to visit him. Pt is [name] in room 1C-129. Case Manager approached me while charting and asked if I had visited Pt in 129. I stated no I did not but I can go visit him. She stated, "he said he had seen the Chaplain." I advised her that it was someone else but I will go visit him now. [name] was in good spirits and he shared with me about his diagnosis and how he was  yet encouraged and believed that he will be alright. He stated, He has cancer but he is trusting God and he's going to touch the hem of God's garment one day. I provided reflective listening, spiritual and emotional support and prayer was given and received at the end of the visit. I also told [name] that I will let Andee Poles know I visited him and when he comes back to work we both will try to visit him again. [name] said, thank you I would love that tell that pastor come see me again I enjoyed him.

## 2021-04-20 NOTE — Progress Notes (Signed)
Patient refused Lovenox. RN educated patient on indication and benefits of Lovenox and patient verbalized understanding  but still refused to receive medication. Spoke with Dr. Kurtis Bushman in person and made her aware that patient refused Lovenox. MD acknowledged.

## 2021-04-20 NOTE — Progress Notes (Signed)
     Patient is pending SNF disposition at this time. Previously expressed goals and wishes are clear to continue to treat, full code/full scope. Metastatic prostate cancer is stable.   No further inpatient palliative needs, with recommendations for outpatient palliative follow-up once discharged. Will refer to colleagues at the [place] center.   Please call with urgent needs or if changes in status/goals.    Thank you for allowing PMT to assist in Ryan [name] care.   Alda Lea, AGPCNP-BC Palliative Medicine Team  Phone: [phone] Amion: [place]   NO CHARGE

## 2021-04-20 NOTE — Progress Notes (Signed)
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 TLSO recommended from nurosurgery and devliered to room.    PT Comments    Pt was long sitting in bed upon arriving. He agrees to session with encouragement." Whatever you say boss." Pt was able to exit L side of bed while adhering to spinal precautions however continues to be unwilling to wear TLSO during OOB activity. He stood to RW and ambulated 120 ft with CGA for safety. No LOB or difficulty. Pt does endorse fatigue and needed several standing rest. Recommend DC to SNF to address strength and mobility deficits while assisting pt to PLOF.    Follow Up Recommendations  SNF     Equipment Recommendations  None recommended by PT       Precautions / Restrictions Precautions Precautions: Fall Required Braces or Orthoses: Spinal Brace Spinal Brace: Thoracolumbosacral orthotic (pt refused) Restrictions Weight Bearing Restrictions: No    Mobility  Bed Mobility Overal bed mobility: Needs Assistance Bed Mobility: Supine to Sit Rolling: Supervision Sidelying to sit: Supervision Supine to sit: Supervision Sit to supine: Supervision Sit to sidelying: Supervision General bed mobility comments: Pt was able to exit L side of bed without physical assistance. continues to be unwilling to wear TLSO but did adhere to spinal precautions    Transfers Overall transfer level: Needs assistance Equipment used: Rolling walker (2 wheeled) Transfers: Sit to/from Stand Sit to Stand: Min  guard         General transfer comment: CGA for safety only. vcs for imcreased fwd wt shift  Ambulation/Gait Ambulation/Gait assistance: Min guard Gait Distance (Feet): 120 Feet Assistive device: Rolling walker (2 wheeled) Gait Pattern/deviations: Trunk flexed;Narrow base of support;Step-through pattern Gait velocity: decreased   General Gait Details: pt was able to ambulate 120 ft with RW without LOB. several standing rest due to c/o L heel pain. no visual concerns with L heel upon assessment     Balance Overall balance assessment: Needs assistance Sitting-balance support: Feet supported;Bilateral upper extremity supported Sitting balance-Leahy Scale: Good     Standing balance support: Bilateral upper extremity supported;During functional activity Standing balance-Leahy Scale: Fair Standing balance comment: requires UE support       Cognition Arousal/Alertness: Awake/alert Behavior During Therapy: WFL for tasks assessed/performed Overall Cognitive Status: Within Functional Limits for tasks assessed         General Comments: Pt was is A and O x 4             Pertinent Vitals/Pain Pain Assessment: 0-10 Pain Score: 4  Faces Pain Scale: Hurts little more Pain Location: L heel Pain Descriptors / Indicators: Discomfort Pain Intervention(s): Limited activity within patient's tolerance;Monitored during session;Repositioned;Premedicated before session    PT Goals (current goals can now be found in the care plan section) Acute Rehab PT Goals Patient Stated Goal: to get stronger Progress towards PT goals: Progressing toward goals    Frequency    Min 2X/week      PT Plan Current plan remains appropriate    Co-evaluation     PT goals addressed during session: Mobility/safety with mobility;Balance;Proper use  of DME;Strengthening/ROM        AM-PAC PT "6 Clicks" Mobility   Outcome Measure  Help needed turning from your back to your side while in a flat  bed without using bedrails?: A Little Help needed moving from lying on your back to sitting on the side of a flat bed without using bedrails?: A Little Help needed moving to and from a bed to a chair (including a wheelchair)?: A Little Help needed standing up from a chair using your arms (e.g., wheelchair or bedside chair)?: A Little Help needed to walk in hospital room?: A Little Help needed climbing 3-5 steps with a railing? : A Little 6 Click Score: 18    End of Session Equipment Utilized During Treatment: Gait belt Activity Tolerance: Patient tolerated treatment well Patient left: Other (comment) (on [facility] with RN aware) Nurse Communication: Mobility status PT Visit Diagnosis: Unsteadiness on feet (R26.81);Muscle weakness (generalized) (M62.81);Difficulty in walking, not elsewhere classified (R26.2)     Time: 1030-1045 PT Time Calculation (min) (ACUTE ONLY): 15 min  Charges:  $Gait Training: 8-22 mins                     Julaine Fusi PTA 04/20/21, 11:13 AM

## 2021-04-21 NOTE — Progress Notes (Signed)
PROGRESS NOTE    [name]  LFY:[id number] DOB: 1961/05/20 DOA: 03/30/2021 PCP: Pcp, No   Chief Complaint  Patient presents with   Pain Management    Brief Narrative: Ryan [name] is a 60 y.o. male with medical history significant for history of polysubstance abuse including THC, cocaine, alcohol, remote history of tobacco use, history of right proximal femur inter trochanteric fracture status post right IM nail intertrochanteric fixation in 04/02/2021, does not follow-up with a PCP, presents to the emergency department for chief concerns of right hip pain.  Pain is chronic for several months, denies any trauma, difficulty ambulation due to pain. He also endorses an unintentional 35 pound weight loss in the last year, denies bright red blood per rectum and melena. patient was found to have L5 compression fracture, L3/4 severe stenosis.  She was also found to have widespread bony metastasis  from prostate cancer. Patient is a followed by oncology.  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 Neurosurgery consulted recommended not a surgical candidate Pain control   Severe symptomatic anemia with folic acid deficiency Transfuse to keep hemoglobin greater than 7.   Hypokalemia ,  anion gap metabolic acidosis Improved   Cancer cachexia with severe protein  calorie malnutrition Dietary on board recommend encouraging oral intake.  DVT prophylaxis: lovenox.  Code Status: full code.  Family Communication: none at bedside.  Disposition:   Status is: Inpatient  Remains inpatient appropriate because:Unsafe d/c plan  Dispo: The patient is from: Home              Anticipated d/c is to: SNF              Patient currently is medically stable to d/c.   Difficult to place patient Yes       Consultants:  None.   Procedures: none.   Antimicrobials: none.   Subjective: No new complaints today.   Objective: Vitals:   04/21/21 0028 04/21/21 0424 04/21/21 0805 04/21/21 1205  BP: 103/66 110/66 111/62 90/71  Pulse: 77 76 80 79  Resp: 18 16 18 20   Temp: 98.1 F (36.7 C) 98.9 F (37.2 C) 97.7 F (36.5 C) 98 F (36.7 C)  TempSrc: Oral  Oral Oral  SpO2: 100% 100% 100% 100%  Weight:      Height:        Intake/Output Summary (Last 24 hours) at 04/21/2021 1555 Last data filed at 04/21/2021 1022 Gross per 24 hour  Intake 600 ml  Output 1025 ml  Net -425 ml   Filed Weights   04/01/21 1232 04/08/21 0800 04/14/21 1246  Weight: 54.7 kg 41.6 kg 51.5 kg    Examination:  General exam: cachetic looking gentleman not in distress.  Respiratory system: Clear to auscultation. Respiratory effort normal. Cardiovascular system: S1 & S2 heard, RRR. No JVD, murmurs, rubs, gallops or clicks. No pedal edema. Gastrointestinal system: Abdomen  is nondistended, soft and nontender.  Normal bowel sounds heard. Central nervous system: Alert and oriented. No focal neurological deficits. Extremities: Symmetric 5 x 5 power. Skin: No rashes, lesions or ulcers Psychiatry: Judgement and insight appear normal. Mood & affect appropriate.     Data Reviewed: I have personally reviewed following labs and imaging studies  CBC: Recent Labs  Lab 04/19/21 0551  WBC 6.4  HGB 7.9*  HCT 23.8*  MCV 80.1  PLT 559    Basic Metabolic Panel: Recent Labs  Lab  04/19/21 0551  NA 129*  K 4.2  CL 103  CO2 21*  GLUCOSE 99  BUN 11  CREATININE 0.49*  CALCIUM 7.6*    GFR: Estimated Creatinine Clearance: 72.4 mL/min (A) (by C-G formula based on SCr of 0.49 mg/dL (L)).  Liver Function Tests: No results for input(s): AST, ALT, ALKPHOS, BILITOT, PROT, ALBUMIN in the last 168 hours.  CBG: No results for input(s): GLUCAP in the last 168 hours.   No results found for this or any previous visit (from the past 240 hour(s)).       Radiology Studies: No results found.      Scheduled Meds:  enoxaparin (LOVENOX) injection  30 mg Subcutaneous Q24H   feeding supplement  237 mL Oral BID BM   ferrous sulfate  325 mg Oral Q breakfast   folic acid  1 mg Oral Daily   megestrol  400 mg Oral Daily   melatonin  5 mg Oral QHS   midodrine  10 mg Oral TID WC   multivitamin with minerals  1 tablet Oral Daily   polyethylene glycol  17 g Oral Daily   sodium bicarbonate  650 mg Oral TID   sodium chloride  1 g Oral BID WC   Vitamin D (Ergocalciferol)  50,000 Units Oral Q7 days   Continuous Infusions:   LOS: 22 days        Hosie Poisson, MD [facility]   To contact the attending provider between 7A-7P or the covering provider during after hours 7P-7A, please log into the web site [email or web address] and access using universal [place] password for that web site. If you do not have the password, please call the hospital operator.  04/21/2021, 3:55 PM

## 2021-04-22 MED ORDER — ACETAMINOPHEN 325 MG PO TABS
650.0000 mg | ORAL_TABLET | ORAL | Status: DC | PRN
  Administered 2021-04-22 – 2021-05-01 (×4): 650 mg via ORAL
  Filled 2021-04-22 (×5): qty 2

## 2021-04-22 NOTE — Progress Notes (Signed)
PROGRESS NOTE    Ryan [name]  GYK:[id number] DOB: September 19, 1961 DOA: 03/30/2021 PCP: Pcp, No   Chief Complaint  Patient presents with   Pain Management    Brief Narrative: Ryan [name] is a 60 y.o. male with medical history significant for history of polysubstance abuse including THC, cocaine, alcohol, remote history of tobacco use, history of right proximal femur inter trochanteric fracture status post right IM nail intertrochanteric fixation in 04/02/2021, does not follow-up with a PCP, presents to the emergency department for chief concerns of right hip pain.  Pain is chronic for several months, denies any trauma, difficulty ambulation due to pain. He also endorses an unintentional 35 pound weight loss in the last year, denies bright red blood per rectum and melena. patient was found to have L5 compression fracture, L3/4 severe stenosis.  She was also found to have widespread bony metastasis  from prostate cancer. Patient is a followed by oncology.  He needed SNF placement, not able to find accepting facility. Pt seen and examined and answered his questions.   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 Neurosurgery consulted recommended not a surgical candidate Pain control. Recommend outpatient follow up with oncology for treatment.    Severe symptomatic anemia with folic acid deficiency Transfuse to keep hemoglobin  greater than 7. Recheck labs in one week.    Hyponatremia:  Sec to poor nutrition .    Hypokalemia ,  anion gap metabolic acidosis Improved   Cancer cachexia with severe protein calorie malnutrition Dietary on board recommend encouraging oral intake.  DVT prophylaxis: lovenox.  Code Status: full code.  Family Communication: none at bedside.  Disposition:   Status is: Inpatient  Remains inpatient appropriate because:Unsafe d/c plan  Dispo: The patient is from: Home              Anticipated d/c is to: SNF              Patient currently is medically stable to d/c.   Difficult to place patient Yes       Consultants:  None.   Procedures: none.   Antimicrobials: none.   Subjective: No new complaints.   Objective: Vitals:   04/21/21 2242 04/22/21 0138 04/22/21 0446 04/22/21 1144  BP: 91/73 117/61 130/74 118/68  Pulse: 79 75 86 81  Resp: 18 18 18 18   Temp: 98.6 F (37 C) 98.6 F (37 C) 98.4 F (36.9 C) 98 F (36.7 C)  TempSrc:    Oral  SpO2: 100% 99% 100% 100%  Weight:      Height:        Intake/Output Summary (Last 24 hours) at 04/22/2021 1444 Last data filed at 04/22/2021 0448 Gross per 24 hour  Intake --  Output 575 ml  Net -575 ml    Filed Weights   04/01/21 1232 04/08/21 0800 04/14/21 1246  Weight: 54.7 kg 41.6 kg 51.5 kg    Examination:  General exam: Cachetic looking  gentleman not in distress.  Respiratory system: clear to auscultation, no wheezing or rhonchi.  Cardiovascular system: S1 & S2 heard, RRR, no JVD, no pedal edema.  Gastrointestinal system: Abdomen is soft, NT ND BS+ Central nervous system: Alert and oriented, non focal.  Extremities: no edema.  Skin: No rashes seen.  Psychiatry: Mood is appropriate.     Data Reviewed: I have personally reviewed following labs and imaging studies  CBC: Recent Labs  Lab 04/19/21 0551  WBC 6.4  HGB 7.9*  HCT 23.8*  MCV 80.1  PLT 204     Basic Metabolic Panel: Recent Labs  Lab  04/19/21 0551  NA 129*  K 4.2  CL 103  CO2 21*  GLUCOSE 99  BUN 11  CREATININE 0.49*  CALCIUM 7.6*     GFR: Estimated Creatinine Clearance: 72.4 mL/min (A) (by C-G formula based on SCr of 0.49 mg/dL (L)).  Liver Function Tests: No results for input(s): AST, ALT, ALKPHOS, BILITOT, PROT, ALBUMIN in the last 168 hours.  CBG: No results for input(s): GLUCAP in the last 168 hours.   No results found for this or any previous visit (from the past 240 hour(s)).       Radiology Studies: No results found.      Scheduled Meds:  enoxaparin (LOVENOX) injection  30 mg Subcutaneous Q24H   feeding supplement  237 mL Oral BID BM   ferrous sulfate  325 mg Oral Q breakfast   folic acid  1 mg Oral Daily   megestrol  400 mg Oral Daily   melatonin  5 mg Oral QHS   midodrine  10 mg Oral TID WC   multivitamin with minerals  1 tablet Oral Daily   polyethylene glycol  17 g Oral Daily   sodium bicarbonate  650 mg Oral TID   sodium chloride  1 g Oral BID WC   Vitamin D (Ergocalciferol)  50,000 Units Oral Q7 days   Continuous Infusions:   LOS: 23 days        Hosie Poisson, MD [facility]   To contact the attending provider between 7A-7P or the covering provider during after hours 7P-7A, please log into the web site [email or web address] and access using universal [place] password for that web site. If you do not have the password, please call the hospital operator.  04/22/2021, 2:44 PM

## 2021-04-22 NOTE — TOC Progression Note (Addendum)
Transition of Care [facility]) - Progression Note    Patient Details  Name: Ryan [name] MRN: [id number] Date of Birth: 09-09-1961  Transition of Care [facility]) CM/SW [place], RN Phone Number: 04/22/2021, 3:24 PM  Clinical Narrative:   Patient's aunt states she wished for patient to have a guardian, as she is in her 52's and cannot care for patient.  Aunt states patient's sons are not involved and the only other family are aunts in their 65s who also cannot care for him.  RNCM stated that upon discussion with patient, he stated he has numerous family members who can assist, but aunt states he does not.  Patient was admitted with bed bug infestation, lives alone, unable to care for himself safely and has family unable to care for him.  Medicaid still pending, RNCM called for an update, representative will call back with progress.  Continues to have no bed offers.  APS notified of patient's situation and plan for disposition.  APS will contact RNCM with caseworker name and contact information.  Aunt, patient and APS given TOC contact information, TOC will follow through to discharge.         Expected Discharge Plan and Services                                                 Social Determinants of Health (SDOH) Interventions    Readmission Risk Interventions No flowsheet data found.

## 2021-04-23 ENCOUNTER — Inpatient Hospital Stay: Payer: Medicaid Other

## 2021-04-23 MED ORDER — OXYCODONE HCL 5 MG PO TABS
5.0000 mg | ORAL_TABLET | Freq: Four times a day (QID) | ORAL | Status: DC | PRN
  Administered 2021-04-23 – 2021-04-24 (×3): 10 mg via ORAL
  Administered 2021-04-25: 5 mg via ORAL
  Administered 2021-04-25: 21:00:00 10 mg via ORAL
  Administered 2021-04-25: 03:00:00 5 mg via ORAL
  Administered 2021-04-26 – 2021-04-28 (×5): 10 mg via ORAL
  Administered 2021-04-28: 22:00:00 5 mg via ORAL
  Administered 2021-04-29 – 2021-05-03 (×6): 10 mg via ORAL
  Administered 2021-05-04: 5 mg via ORAL
  Administered 2021-05-04 – 2021-05-05 (×2): 10 mg via ORAL
  Administered 2021-05-05: 5 mg via ORAL
  Administered 2021-05-06 – 2021-05-07 (×2): 10 mg via ORAL
  Filled 2021-04-23 (×2): qty 1
  Filled 2021-04-23 (×2): qty 2
  Filled 2021-04-23: qty 1
  Filled 2021-04-23: qty 2
  Filled 2021-04-23: qty 1
  Filled 2021-04-23 (×5): qty 2
  Filled 2021-04-23: qty 1
  Filled 2021-04-23 (×2): qty 2
  Filled 2021-04-23 (×2): qty 1
  Filled 2021-04-23 (×9): qty 2

## 2021-04-23 NOTE — Progress Notes (Signed)
PROGRESS NOTE    Ryan [name]  OAC:[id number] DOB: [birth year] DOA: 03/30/2021 PCP: Pcp, No   Chief Complaint  Patient presents with   Pain Management    Brief Narrative: Ryan [name] is a 60 y.o. male with medical history significant for history of polysubstance abuse including THC, cocaine, alcohol, remote history of tobacco use, history of right proximal femur inter trochanteric fracture status post right IM nail intertrochanteric fixation in 04/02/2021, does not follow-up with a PCP, presents to the emergency department for chief concerns of right hip pain.  Pain is chronic for several months, denies any trauma, difficulty ambulation due to pain. He also endorses an unintentional 35 pound weight loss in the last year, denies bright red blood per rectum and melena. patient was found to have L5 compression fracture, L3/4 severe stenosis.  She was also found to have widespread bony metastasis  from prostate cancer. Patient is a followed by oncology.  He needed SNF placement, not able to find accepting facility. Pt seen and examined and answered his questions.  Today he reprots pain in the left foot, unable to bear weight on the heel.    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 Neurosurgery consulted recommended not a surgical candidate Pain control. Recommend outpatient follow up with oncology for treatment.    Severe  symptomatic anemia with folic acid deficiency Transfuse to keep hemoglobin greater than 7. Recheck labs in one week.    Hyponatremia:  Sec to poor nutrition .    Hypokalemia ,  anion gap metabolic acidosis Improved   Cancer cachexia with severe protein calorie malnutrition Dietary on board recommend encouraging oral intake.  Left heel pain:  X rays of the foot ordered to evaluate for pathological fractures.  Negative.  Increased oxycodone 1 to 2 pill every 6 hours prn.    DVT prophylaxis: lovenox.  Code Status: full code.  Family Communication: none at bedside.  Disposition:   Status is: Inpatient  Remains inpatient appropriate because:Unsafe d/c plan  Dispo: The patient is from: Home              Anticipated d/c is to: SNF              Patient currently is medically stable to d/c.   Difficult to place patient Yes       Consultants:  None.   Procedures: none.   Antimicrobials: none.   Subjective: Left heel pain .   Objective: Vitals:   04/23/21 0038 04/23/21 0412 04/23/21 0750 04/23/21 1222  BP: 120/74 133/89 104/71 123/78  Pulse: 94 86 89 89  Resp: 18 16 18 16   Temp: 98.2 F (36.8 C) 98.8 F (37.1 C) 99.2 F (37.3 C) 98 F (36.7 C)  TempSrc:      SpO2: 100% 100% 100% 100%  Weight:      Height:        Intake/Output Summary (Last 24 hours) at 04/23/2021 1506 Last data  filed at 04/23/2021 1340 Gross per 24 hour  Intake 480 ml  Output 1851 ml  Net -1371 ml    Filed Weights   04/01/21 1232 04/08/21 0800 04/14/21 1246  Weight: 54.7 kg 41.6 kg 51.5 kg    Examination:  General exam: Cachetic looking gentleman , not in distress.  Respiratory system: air entry fair, no wheezing heard.  Cardiovascular system: S1 & S2 heard, RRR no JVD,   Gastrointestinal system: Abdomen is soft, non tender non distended, bowel sounds wnl.  Central nervous system: Alert and oriented non focal.  Extremities: no pedal edema.  Skin: No rashes seen.  Psychiatry:  Mood is appropriate.     Data Reviewed: I have personally reviewed following labs and imaging studies  CBC: Recent Labs  Lab 04/19/21 0551  WBC 6.4  HGB 7.9*  HCT 23.8*  MCV 80.1  PLT 204     Basic Metabolic Panel: Recent Labs  Lab 04/19/21 0551  NA 129*  K 4.2  CL 103  CO2 21*  GLUCOSE 99  BUN 11  CREATININE 0.49*  CALCIUM 7.6*     GFR: Estimated Creatinine Clearance: 72.4 mL/min (A) (by C-G formula based on SCr of 0.49 mg/dL (L)).  Liver Function Tests: No results for input(s): AST, ALT, ALKPHOS, BILITOT, PROT, ALBUMIN in the last 168 hours.  CBG: No results for input(s): GLUCAP in the last 168 hours.   No results found for this or any previous visit (from the past 240 hour(s)).       Radiology Studies: DG Foot Complete Left  Result Date: 04/23/2021 CLINICAL DATA:  Heel pain sharp pain in heel in a 60 year old male. No reported injury. EXAM: LEFT FOOT - COMPLETE 3+ VIEW COMPARISON:  December of 2014. FINDINGS: Material seen about the hindfoot on the AP view and oblique view limiting bony detail somewhat. Osteopenia. No sign of acute fracture or dislocation. No significant soft tissue swelling. Pes planus as on the previous study. IMPRESSION: 1. Osteopenia. No acute findings. 2. Pes planus. Electronically Signed   By: Zetta Bills M.D.   On: 04/23/2021 14:03        Scheduled Meds:  enoxaparin (LOVENOX) injection  30 mg Subcutaneous Q24H   feeding supplement  237 mL Oral BID BM   ferrous sulfate  325 mg Oral Q breakfast   folic acid  1 mg Oral Daily   megestrol  400 mg Oral Daily   melatonin  5 mg Oral QHS   midodrine  10 mg Oral TID WC   multivitamin with minerals  1 tablet Oral Daily   polyethylene glycol  17 g Oral Daily   sodium bicarbonate  650 mg Oral TID   sodium chloride  1 g Oral BID WC   Vitamin D (Ergocalciferol)  50,000 Units Oral Q7 days   Continuous Infusions:   LOS: 24 days        Hosie Poisson, MD [facility]   To contact the attending provider between 7A-7P or the covering provider during after hours 7P-7A, please log into the web site [email or web address] and access using universal [place] password for that web site. If you do not have the password, please call the hospital operator.  04/23/2021, 3:06 PM

## 2021-04-23 NOTE — TOC Progression Note (Signed)
Transition of Care [facility]) - Progression Note    Patient Details  Name: Ryan [name] MRN: [id number] Date of Birth: 04/10/1961  Transition of Care [facility]) CM/SW [place], RN Phone Number: 04/23/2021, 4:32 PM  Clinical Narrative:   Patient's cousin was in to see patient.  He states that he lives in [place] and patient has no other family, but he would like to be notified with any updates.  He asked that patient only have family visitors, no friends at this time.    Medicaid application in process, awaiting confirmation.  APS is still working on patient's case, awaiting response.         Expected Discharge Plan and Services                                                 Social Determinants of Health (SDOH) Interventions    Readmission Risk Interventions Readmission Risk Prevention Plan 04/23/2021  Transportation Screening Complete  PCP or Specialist Appt within 3-5 Days Complete  HRI or [place] Complete  Social Work Consult for [place] Planning/Counseling Not Complete  SW consult not completed comments RNCM assigned to patient  Palliative Care Screening Complete  Medication Review [employer]) Complete  Some recent data might be hidden

## 2021-04-23 NOTE — Progress Notes (Signed)
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 TLSO recommended from nurosurgery and devliered to room.    PT Comments    Pt was supine in bed upon arriving. He requires encouragement but was agreeable once motivated. " I need to poop first before I walk." Pt did have successful BM prior to ambulating into hallway. He c/o L heel pain again this session. RN/MD made aware with post op shoe and x ray ordered. Pt was able to ambulate ~ 100 ft with slow narrow gait kinematics. Overall tolerated session well. PT recommending SNF at DC due to not having support/assistance/ supervision at home.    Follow Up Recommendations  SNF     Equipment Recommendations  None recommended by PT       Precautions / Restrictions Precautions Precautions: Fall Required Braces or Orthoses: Spinal Brace Spinal Brace: Thoracolumbosacral orthotic Restrictions Weight Bearing Restrictions: No    Mobility  Bed Mobility Overal bed mobility: Needs Assistance Bed Mobility: Supine to Sit Rolling: Supervision Sidelying to sit: Supervision Supine to sit: Supervision Sit to supine: Supervision Sit to sidelying: Supervision      Transfers Overall transfer level: Needs assistance Equipment used: Rolling walker (2 wheeled) Transfers: Sit to/from Stand Sit to Stand: Min guard         General transfer comment: CGA for safety only. no lifting assistance required  Ambulation/Gait Ambulation/Gait assistance: Min guard Gait Distance (Feet): 100  Feet Assistive device: Rolling walker (2 wheeled) Gait Pattern/deviations: Trunk flexed;Narrow base of support;Step-through pattern Gait velocity: decreased   General Gait Details: distance limited by L heel pain. MD aware. ordered x ray and post op shoe     Balance Overall balance assessment: Needs assistance Sitting-balance support: Feet supported;Bilateral upper extremity supported Sitting balance-Leahy Scale: Good     Standing balance support: Bilateral upper extremity supported;During functional activity Standing balance-Leahy Scale: Fair Standing balance comment: no LOB with use of RW during standing activity      Cognition Arousal/Alertness: Awake/alert Behavior During Therapy: WFL for tasks assessed/performed Overall Cognitive Status: Within Functional Limits for tasks assessed    General Comments: Pt was is A and O x 4             Pertinent Vitals/Pain Pain Assessment: 0-10 Pain Score: 4  Faces Pain Scale: Hurts little more Pain Location: L heel Pain Descriptors / Indicators: Discomfort Pain Intervention(s): Monitored during session;Limited activity within patient's tolerance;Repositioned;Premedicated before session     PT Goals (current goals can now be found in the care plan section) Acute Rehab PT Goals Patient Stated Goal: to get stronger    Frequency    Min 2X/week      PT Plan Current plan remains appropriate    Co-evaluation     PT goals addressed during session: Mobility/safety with mobility;Balance;Proper use of DME;Strengthening/ROM        AM-PAC PT "6 Clicks" Mobility   Outcome Measure  Help needed turning from your back to your side while in a flat bed without using bedrails?: A Little Help needed moving from lying on your back to sitting on the  side of a flat bed without using bedrails?: A Little Help needed moving to and from a bed to a chair (including a wheelchair)?: A Little Help needed standing up from a chair using your  arms (e.g., wheelchair or bedside chair)?: A Little Help needed to walk in hospital room?: A Little Help needed climbing 3-5 steps with a railing? : A Little 6 Click Score: 18    End of Session Equipment Utilized During Treatment: Gait belt Activity Tolerance: Patient tolerated treatment well Patient left: in bed;with call bell/phone within reach;with bed alarm set;with nursing/sitter in room Nurse Communication: Mobility status PT Visit Diagnosis: Unsteadiness on feet (R26.81);Muscle weakness (generalized) (M62.81);Difficulty in walking, not elsewhere classified (R26.2)     Time: 1030-1101 PT Time Calculation (min) (ACUTE ONLY): 31 min  Charges:  $Gait Training: 8-22 mins $Therapeutic Activity: 8-22 mins                     Julaine Fusi PTA 04/23/21, 12:23 PM

## 2021-04-23 NOTE — Progress Notes (Signed)
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 TLSO recommended from nurosurgery and devliered to room.   OT comments  Upon entering the room, pt supine in bed with family present in the room. Pt needing maximal encouragement for participation this session. OT demonstrated how to don new darco off loading shoe ordered by MD for L heel pain. Pt donning shoe with min cuing for technique. Pt also dons pants from bed level with set up A. Supine >sit with min A to EOB and pt standing with min guard and use of RW. Pt ambulates 87' with RW and min guard before returning back to bed. Pt reports 7/10 L heel pain and RN notified. All needs within reach and bed alarm activated. Pt making progress towards goals and continues to benefit from OT intervention.   Follow Up Recommendations  SNF    Equipment Recommendations  Other (comment);3 in 1 bedside commode;Tub/shower seat       Precautions / Restrictions Precautions Precautions: Fall Required Braces or Orthoses: Spinal Brace Spinal Brace: Thoracolumbosacral orthotic       Mobility Bed Mobility Overal bed mobility: Needs Assistance Bed Mobility: Supine to Sit     Supine to sit: Supervision Sit to supine: Supervision   General bed mobility comments: pt declined TLSO but performed bed mobility without physical assist    Transfers Overall transfer level: Needs assistance Equipment used: Rolling walker (2 wheeled) Transfers: Sit to/from Stand Sit to Stand: Min guard               Balance Overall balance assessment: Needs assistance Sitting-balance support: Feet supported;Bilateral upper extremity supported Sitting balance-Leahy Scale: Good     Standing balance support: Bilateral upper extremity supported;During functional activity Standing balance-Leahy Scale: Fair Standing balance comment: no LOB with use of RW during standing activity                           ADL either performed or assessed with clinical judgement   ADL Overall ADL's : Needs assistance/impaired                     Lower Body Dressing: Set up;Bed level Lower Body Dressing Details (indicate cue type and reason): dons pants and darco off loading shoe                     Vision Patient Visual Report: No change from baseline            Cognition Arousal/Alertness: Awake/alert Behavior During Therapy: WFL for tasks assessed/performed Overall Cognitive Status: Within Functional Limits for tasks assessed                                 General Comments: Pt was is A and O x 4                   Pertinent Vitals/ Pain       Pain Assessment: 0-10 Pain Score: 7  Pain  Location: L heel Pain Descriptors / Indicators: Discomfort Pain Intervention(s): Limited activity within patient's tolerance;Monitored during session;Repositioned;Patient requesting pain meds-RN notified   Progress Toward Goals  OT Goals(current goals can now be found in the care plan section)  Progress towards OT goals: Progressing toward goals  Acute Rehab OT Goals Patient Stated Goal: to get stronger OT Goal Formulation: With patient Time For Goal Achievement: 04/27/21  Plan Discharge plan remains appropriate;Frequency needs to be updated       AM-PAC OT "6 Clicks" Daily Activity     Outcome Measure   Help from another person eating meals?: A Little Help from another person taking care of personal grooming?: A Little Help from another person toileting,  which includes using toliet, bedpan, or urinal?: A Little Help from another person bathing (including washing, rinsing, drying)?: A Little Help from another person to put on and taking off regular upper body clothing?: A Little Help from another person to put on and taking off regular lower body clothing?: A Little 6 Click Score: 18    End of Session Equipment Utilized During Treatment: Rolling walker  OT Visit Diagnosis: Unsteadiness on feet (R26.81);Muscle weakness (generalized) (M62.81) Pain - Right/Left: Right Pain - part of body:  (L heel)   Activity Tolerance Other (comment);Patient limited by fatigue (Pt needs maximal encouragment)   Patient Left in chair;with call bell/phone within reach   Nurse Communication Mobility status        Time: 9741-6384 OT Time Calculation (min): 24 min  Charges: OT General Charges $OT Visit: 1 Visit OT Treatments $Self Care/Home Management : 8-22 mins $Therapeutic Activity: 8-22 mins  Darleen Crocker, MS, OTR/L , CBIS ascom [phone]  04/23/21, 3:26 PM

## 2021-04-24 DIAGNOSIS — M79672 Pain in left foot: Secondary | ICD-10-CM

## 2021-04-24 LAB — BASIC METABOLIC PANEL
Anion gap: 7 (ref 5–15)
BUN: 14 mg/dL (ref 6–20)
CO2: 21 mmol/L — ABNORMAL LOW (ref 22–32)
Calcium: 8 mg/dL — ABNORMAL LOW (ref 8.9–10.3)
Chloride: 106 mmol/L (ref 98–111)
Creatinine, Ser: 0.46 mg/dL — ABNORMAL LOW (ref 0.61–1.24)
GFR, Estimated: >60 mL/min (ref >60)
Glucose, Bld: 119 mg/dL — ABNORMAL HIGH (ref 70–99)
Potassium: 4.3 mmol/L (ref 3.5–5.1)
Sodium: 134 mmol/L — ABNORMAL LOW (ref 135–145)

## 2021-04-24 LAB — CBC
HCT: 24.3 % — ABNORMAL LOW (ref 39.0–52.0)
Hemoglobin: 8 g/dL — ABNORMAL LOW (ref 13.0–17.0)
MCH: 26.6 pg (ref 26.0–34.0)
MCHC: 32.9 g/dL (ref 30.0–36.0)
MCV: 80.7 fL (ref 80.0–100.0)
Platelets: 213 10*3/uL (ref 150–400)
RBC: 3.01 MIL/uL — ABNORMAL LOW (ref 4.22–5.81)
RDW: 17.8 % — ABNORMAL HIGH (ref 11.5–15.5)
WBC: 6.7 10*3/uL (ref 4.0–10.5)
nRBC: 0.3 % — ABNORMAL HIGH (ref 0.0–0.2)

## 2021-04-24 NOTE — Progress Notes (Signed)
PROGRESS NOTE    Ryan [name]  JYN:[id number] DOB: 1960-11-03 DOA: 03/30/2021 PCP: Pcp, No   Chief Complaint  Patient presents with   Pain Management    Brief Narrative: Ryan [name] is a 60 y.o. male with medical history significant for history of polysubstance abuse including THC, cocaine, alcohol, remote history of tobacco use, history of right proximal femur inter trochanteric fracture status post right IM nail intertrochanteric fixation in 04/02/2021, does not follow-up with a PCP, presents to the emergency department for chief concerns of right hip pain.  Pain is chronic for several months, denies any trauma, difficulty ambulation due to pain. He also endorses an unintentional 35 pound weight loss in the last year, denies bright red blood per rectum and melena. patient was found to have L5 compression fracture, L3/4 severe stenosis.  She was also found to have widespread bony metastasis  from prostate cancer. Patient is a followed by oncology.  He needed SNF placement, not able to find accepting facility. Pt seen and examined at bedside, left heel pain is improving, x rays are negative for fractures.     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)   Infestation by bed bug   Prostate cancer metastatic to bone (HCC)   Hypotension   Metastatic prostate cancer with mets to bone with associated L5 compression fracture, L3-L4 severe spinal stenosis with right lower extremity weakness. Neurosurgery consulted recommended not a surgical candidate Pain control. Recommend outpatient follow up with oncology for treatment.    Severe symptomatic anemia with  folic acid deficiency Transfuse to keep hemoglobin greater than 7.  Repeat labs show hemoglobin of 8.    Hyponatremia:  Improved to 134. asymptomatic   Hypokalemia ,  anion gap metabolic acidosis Resolved.    Cancer cachexia with severe protein calorie malnutrition Dietary on board recommend encouraging oral intake.  Left heel pain:  X rays of the foot ordered to evaluate for pathological fractures.  Negative.  Increased oxycodone 1 to 2 pill every 6 hours prn.   Hypotension:  Asymptomatic, keep MAP greater than 65, on Midodrine.  Recheck BP in one hour.    DVT prophylaxis: lovenox.  Code Status: full code.  Family Communication: none at bedside.  Disposition:   Status is: Inpatient  Remains inpatient appropriate because:Unsafe d/c plan  Dispo: The patient is from: Home              Anticipated d/c is to: SNF              Patient currently is medically stable to d/c.   Difficult to place patient Yes       Consultants:  None.   Procedures: none.   Antimicrobials: none.   Subjective: No chest pain or sob.   Objective: Vitals:   04/23/21 1703 04/24/21 0040 04/24/21 0451 04/24/21 0754  BP: 91/65 (!) 95/53 105/70 (!) 83/49  Pulse: 85 98 99 98  Resp: 18 16 16 16   Temp: 98.8 F (37.1 C) 98.2 F (36.8 C) 98.9 F (37.2 C) 98.9 F (37.2 C)  TempSrc:   Oral   SpO2: 99% 97% 99% 97%  Weight:      Height:  Intake/Output Summary (Last 24 hours) at 04/24/2021 0932 Last data filed at 04/24/2021 0200 Gross per 24 hour  Intake 480 ml  Output 1051 ml  Net -571 ml    Filed Weights   04/01/21 1232 04/08/21 0800 04/14/21 1246  Weight: 54.7 kg 41.6 kg 51.5 kg    Examination:  General exam: cachetic looking gentleman, not in distress.  Respiratory system: clear to auscultation, no wheezing heard.  Cardiovascular system: S1S2 RRR no JVD no pedal edema.  Gastrointestinal system: Abdomen is soft, NT ND BS+ Central nervous system: Alert and oriented,  non focal . Extremities: no pedal edema.  Skin: No rashes seen.  Psychiatry: Mood is appropriate.     Data Reviewed: I have personally reviewed following labs and imaging studies  CBC: Recent Labs  Lab 04/19/21 0551 04/24/21 0551  WBC 6.4 6.7  HGB 7.9* 8.0*  HCT 23.8* 24.3*  MCV 80.1 80.7  PLT 204 213     Basic Metabolic Panel: Recent Labs  Lab 04/19/21 0551 04/24/21 0551  NA 129* 134*  K 4.2 4.3  CL 103 106  CO2 21* 21*  GLUCOSE 99 119*  BUN 11 14  CREATININE 0.49* 0.46*  CALCIUM 7.6* 8.0*     GFR: Estimated Creatinine Clearance: 72.4 mL/min (A) (by C-G formula based on SCr of 0.46 mg/dL (L)).  Liver Function Tests: No results for input(s): AST, ALT, ALKPHOS, BILITOT, PROT, ALBUMIN in the last 168 hours.  CBG: No results for input(s): GLUCAP in the last 168 hours.   No results found for this or any previous visit (from the past 240 hour(s)).       Radiology Studies: DG Foot Complete Left  Result Date: 04/23/2021 CLINICAL DATA:  Heel pain sharp pain in heel in a 60 year old male. No reported injury. EXAM: LEFT FOOT - COMPLETE 3+ VIEW COMPARISON:  December of 2014. FINDINGS: Material seen about the hindfoot on the AP view and oblique view limiting bony detail somewhat. Osteopenia. No sign of acute fracture or dislocation. No significant soft tissue swelling. Pes planus as on the previous study. IMPRESSION: 1. Osteopenia. No acute findings. 2. Pes planus. Electronically Signed   By: Zetta Bills M.D.   On: 04/23/2021 14:03        Scheduled Meds:  enoxaparin (LOVENOX) injection  30 mg Subcutaneous Q24H   feeding supplement  237 mL Oral BID BM   ferrous sulfate  325 mg Oral Q breakfast   folic acid  1 mg Oral Daily   megestrol  400 mg Oral Daily   melatonin  5 mg Oral QHS   midodrine  10 mg Oral TID WC   multivitamin with minerals  1 tablet Oral Daily   polyethylene glycol  17 g Oral Daily   sodium bicarbonate  650 mg Oral TID   sodium chloride   1 g Oral BID WC   Vitamin D (Ergocalciferol)  50,000 Units Oral Q7 days   Continuous Infusions:   LOS: 25 days        Hosie Poisson, MD [facility]   To contact the attending provider between 7A-7P or the covering provider during after hours 7P-7A, please log into the web site [email or web address] and access using universal [place] password for that web site. If you do not have the password, please call the hospital operator.  04/24/2021, 9:32 AM

## 2021-04-25 ENCOUNTER — Inpatient Hospital Stay: Payer: Medicaid Other

## 2021-04-25 LAB — URINALYSIS, COMPLETE (UACMP) WITH MICROSCOPIC
Bilirubin Urine: NEGATIVE
Glucose, UA: NEGATIVE mg/dL
Hgb urine dipstick: NEGATIVE
Ketones, ur: NEGATIVE mg/dL
Leukocytes,Ua: NEGATIVE
Nitrite: POSITIVE — AB
Protein, ur: NEGATIVE mg/dL
Specific Gravity, Urine: 1.017 (ref 1.005–1.030)
pH: 9 — ABNORMAL HIGH (ref 5.0–8.0)

## 2021-04-25 NOTE — Progress Notes (Signed)
PROGRESS NOTE    Ryan [name]  DPO:[id number] DOB: 08-15-1961 DOA: 03/30/2021 PCP: Pcp, No   Chief Complaint  Patient presents with   Pain Management    Brief Narrative: Ryan [name] is a 60 y.o. male with medical history significant for history of polysubstance abuse including THC, cocaine, alcohol, remote history of tobacco use, history of right proximal femur inter trochanteric fracture status post right IM nail intertrochanteric fixation in 04/02/2021, does not follow-up with a PCP, presents to the emergency department for chief concerns of right hip pain.  Pain is chronic for several months, denies any trauma, difficulty ambulation due to pain. He also endorses an unintentional 35 pound weight loss in the last year, denies bright red blood per rectum and melena. patient was found to have L5 compression fracture, L3/4 severe stenosis.  She was also found to have widespread bony metastasis  from prostate cancer. Patient is a followed by oncology.  He needed SNF placement, not able to find accepting facility. Pt seen and examined at bedside, left heel pain is improving, x rays are negative for fractures.  Pt refused sodium bicarb this am. No new complaints.    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 Neurosurgery consulted recommended not a surgical candidate Pain control. Recommend outpatient follow up with oncology for  treatment.  Suspect he will remain in the hospital the end of the month   Severe symptomatic anemia with folic acid deficiency Transfuse to keep hemoglobin greater than 7.  Repeat labs show hemoglobin of 8.    Hyponatremia:  Improved to 134. asymptomatic   Hypokalemia ,anion gap metabolic acidosis Resolved.    Cancer cachexia with severe protein calorie malnutrition Dietary on board recommend encouraging oral intake.  Left heel pain:  X rays of the foot ordered to evaluate for pathological fractures.  Negative.  Increased oxycodone 1 to 2 pill every 6 hours prn.   Hypotension:  Asymptomatic, keep MAP greater than 65, on Midodrine.  Resolved.    Fever: Blood cultures ordered, get CXR and UA.     DVT prophylaxis: lovenox.  Code Status: full code.  Family Communication: none at bedside.  Disposition:   Status is: Inpatient  Remains inpatient appropriate because:Unsafe d/c plan  Dispo: The patient is from: Home              Anticipated d/c is to: SNF              Patient currently is not medically stable to d/c.   Difficult to place patient Yes       Consultants:  Oncology.   Procedures: none.   Antimicrobials: none.   Subjective: Febrile this morning. No chest pain. Or sob.   Objective: Vitals:   04/24/21 1945 04/25/21 0053 04/25/21 0437 04/25/21 0811  BP: 109/71 (!) 88/50 (!) 102/59 109/70  Pulse: (!) 102 98 (!) 102 100  Resp: 14 18 18 18   Temp: 98.8 F (37.1 C)  98.5 F (36.9 C) 97.6 F (36.4 C) (!) 100.5 F (38.1 C)  TempSrc: Oral     SpO2: 99% 98% 96% 99%  Weight:      Height:        Intake/Output Summary (Last 24 hours) at 04/25/2021 1035 Last data filed at 04/25/2021 0100 Gross per 24 hour  Intake 120 ml  Output 600 ml  Net -480 ml    Filed Weights   04/01/21 1232 04/08/21 0800 04/14/21 1246  Weight: 54.7 kg 41.6 kg 51.5 kg    Examination:  General exam: Cachetic looking gentlem Respiratory system: air entry fair no  wheezing or rhonchi.  Cardiovascular system: S1S2 , RRR,no JVD, no pedal edmea.  Gastrointestinal system: Abdomen is soft, non tender non distended ,bowel sounds wnl.  Central nervous system: Alert and answering questions appropriately.  Extremities: no pedal edema.  Skin: No rashes seen.  Psychiatry: Mood is appropriate.     Data Reviewed: I have personally reviewed following labs and imaging studies  CBC: Recent Labs  Lab 04/19/21 0551 04/24/21 0551  WBC 6.4 6.7  HGB 7.9* 8.0*  HCT 23.8* 24.3*  MCV 80.1 80.7  PLT 204 213     Basic Metabolic Panel: Recent Labs  Lab 04/19/21 0551 04/24/21 0551  NA 129* 134*  K 4.2 4.3  CL 103 106  CO2 21* 21*  GLUCOSE 99 119*  BUN 11 14  CREATININE 0.49* 0.46*  CALCIUM 7.6* 8.0*     GFR: Estimated Creatinine Clearance: 72.4 mL/min (A) (by C-G formula based on SCr of 0.46 mg/dL (L)).  Liver Function Tests: No results for input(s): AST, ALT, ALKPHOS, BILITOT, PROT, ALBUMIN in the last 168 hours.  CBG: No results for input(s): GLUCAP in the last 168 hours.   No results found for this or any previous visit (from the past 240 hour(s)).       Radiology Studies: DG Foot Complete Left  Result Date: 04/23/2021 CLINICAL DATA:  Heel pain sharp pain in heel in a 60 year old male. No reported injury. EXAM: LEFT FOOT - COMPLETE 3+ VIEW COMPARISON:  December of 2014. FINDINGS: Material seen about the hindfoot on the AP view and oblique view limiting bony detail somewhat. Osteopenia. No sign of acute fracture or dislocation. No significant soft tissue swelling. Pes planus as on the previous study. IMPRESSION: 1. Osteopenia. No acute findings. 2. Pes planus. Electronically Signed   By: Zetta Bills M.D.   On: 04/23/2021 14:03        Scheduled Meds:  enoxaparin (LOVENOX) injection  30 mg Subcutaneous Q24H   feeding supplement  237 mL Oral BID BM   ferrous sulfate  325 mg Oral Q breakfast   folic acid  1 mg Oral Daily   megestrol   400 mg Oral Daily   melatonin  5 mg Oral QHS   midodrine  10 mg Oral TID WC   multivitamin with minerals  1 tablet Oral Daily   polyethylene glycol  17 g Oral Daily   sodium bicarbonate  650 mg Oral TID   sodium chloride  1 g Oral BID WC   Vitamin D (Ergocalciferol)  50,000 Units Oral Q7 days   Continuous Infusions:   LOS: 26 days        Hosie Poisson, MD [facility]   To contact the attending provider between 7A-7P or the covering provider during after hours 7P-7A, please log into the web site [email or web address] and access using universal [place] password for that web site.  If you do not have the password, please call the hospital operator.  04/25/2021, 10:35 AM

## 2021-04-25 NOTE — Progress Notes (Signed)
Patient refusing medication this morning. Patient educated on midodrine, sodium, and iron tabs at this time; patient also educated on patient rights regarding choosing to take or refuse medications. Patient agreeable to taking midodrine & iron, refusing sodium at this time.

## 2021-04-26 LAB — BLOOD CULTURE ID PANEL (REFLEXED) - BCID2

## 2021-04-26 MED ORDER — CEFTRIAXONE SODIUM 1 G IJ SOLR
1.0000 g | INTRAMUSCULAR | Status: DC
  Administered 2021-04-26 – 2021-04-27 (×2): 1 g via INTRAVENOUS
  Filled 2021-04-26 (×2): qty 1
  Filled 2021-04-26: qty 10

## 2021-04-26 NOTE — Progress Notes (Signed)
PROGRESS NOTE    Ryan [name]  QQP:[id number] DOB: 1961/03/19 DOA: 03/30/2021 PCP: Pcp, No   Chief Complaint  Patient presents with   Pain Management    Brief Narrative: Ryan [name] is a 60 y.o. male with medical history significant for history of polysubstance abuse including THC, cocaine, alcohol, remote history of tobacco use, history of right proximal femur inter trochanteric fracture status post right IM nail intertrochanteric fixation in 04/02/2021, does not follow-up with a PCP, presents to the emergency department for chief concerns of right hip pain.  Pain is chronic for several months, denies any trauma, difficulty ambulation due to pain. He also endorses an unintentional 35 pound weight loss in the last year, denies bright red blood per rectum and melena. patient was found to have L5 compression fracture, L3/4 severe stenosis.  She was also found to have widespread bony metastasis  from prostate cancer. Patient is a followed by oncology.  He needed SNF placement, not able to find accepting facility. Pt seen and examined at bedside. He is intermittently refusing some meds.  Was febrile over the weekend and his blood cultures show 1/4 bottles GPC, possibly contaminant. He has frequent urination and occasional dry cough. CXR unremarkable abut UA is positive for nitrites, rare bacteria. Urine cultures and repeat Blood cultures ordered, if they are negative will discontinue antibiotics in the next 24 to 48 hours.    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 Neurosurgery consulted recommended not a surgical candidate Pain control. Recommend outpatient follow up with oncology for treatment.  Suspect he will remain in the hospital the end of the month   Severe symptomatic anemia with folic acid deficiency Transfuse to keep hemoglobin greater than 7.  Repeat labs show hemoglobin of 8.    Hyponatremia:  Improved to 134. asymptomatic   Hypokalemia ,anion gap metabolic acidosis Resolved.    Cancer cachexia with severe protein calorie malnutrition Dietary on board recommend encouraging oral intake.  Left heel pain:  X rays of the foot ordered to evaluate for pathological fractures.  Negative.  Increased oxycodone 1 to 2 pill every 6 hours prn.   Hypotension:  Asymptomatic, keep MAP greater than 65, on Midodrine.  Resolved.    Fever: Blood cultures ordered, get CXR and UA.  Was febrile over the weekend and his blood cultures show 1/4 bottles GPC, possibly contaminant. He has frequent urination and occasional dry cough. CXR unremarkable abut UA is positive for nitrites, rare bacteria. Urine cultures and repeat Blood cultures ordered, if they are negative will discontinue antibiotics in the next 24 to 48 hours.    DVT prophylaxis: lovenox.  Code Status: full code.  Family Communication: none at bedside.  Disposition:   Status is: Inpatient  Remains inpatient appropriate because:Unsafe d/c plan  Dispo: The patient is from: Home              Anticipated d/c is to: SNF  Patient currently is not medically stable to d/c.   Difficult to place patient Yes       Consultants:  Oncology.   Procedures: none.   Antimicrobials: none.   Subjective: Afebrile, sleepy, no complaints.   Objective: Vitals:   04/26/21 0013 04/26/21 0455 04/26/21 0909 04/26/21 1106  BP: 103/67 90/60  (!) 103/58 98/72  Pulse: 90 100 (!) 103 100  Resp: 16 16 18 18   Temp: 98.2 F (36.8 C) 99.5 F (37.5 C) 99.1 F (37.3 C) 98.4 F (36.9 C)  TempSrc: Oral     SpO2:  98% 98% 100%  Weight:      Height:        Intake/Output Summary (Last 24 hours) at 04/26/2021 1634 Last data filed at 04/26/2021 1026 Gross per 24 hour  Intake 180 ml  Output 450 ml  Net -270 ml    Filed Weights   04/01/21 1232 04/08/21 0800 04/14/21 1246  Weight: 54.7 kg 41.6 kg 51.5 kg    Examination:  General exam: Cachetic , ill appearing gentleman, not in distrss.  Respiratory system: Air entry fair, no wheezing or rhonchi on room air Cardiovascular system: , regular rate rhythm, no JVD no pedal edema Gastrointestinal system: Abdomen is soft, nontender nondistended bowel sounds normal Central nervous system: Sleepy this morning and answering questions appropriately and working with PT, likely Extremities: Edema, muscle wasting seen Skin: No rashes seen Psychiatry: Mood appropriate    Data Reviewed: I have personally reviewed following labs and imaging studies  CBC: Recent Labs  Lab 04/24/21 0551  WBC 6.7  HGB 8.0*  HCT 24.3*  MCV 80.7  PLT 213     Basic Metabolic Panel: Recent Labs  Lab 04/24/21 0551  NA 134*  K 4.3  CL 106  CO2 21*  GLUCOSE 119*  BUN 14  CREATININE 0.46*  CALCIUM 8.0*     GFR: Estimated Creatinine Clearance: 72.4 mL/min (A) (by C-G formula based on SCr of 0.46 mg/dL (L)).  Liver Function Tests: No results for input(s): AST, ALT, ALKPHOS, BILITOT, PROT, ALBUMIN in the last 168 hours.  CBG: No results for input(s): GLUCAP in the last 168 hours.   Recent Results (from the past 240 hour(s))  Culture, blood (Routine X 2) w Reflex to ID Panel     Status: None (Preliminary result)   Collection Time: 04/25/21 11:21 AM   Specimen: BLOOD  Result Value Ref Range Status   Specimen Description BLOOD BLOOD RIGHT HAND  Final   Special Requests   Final    BOTTLES  DRAWN AEROBIC AND ANAEROBIC Blood Culture adequate volume   Culture   Final    NO GROWTH < 24 HOURS Performed at [facility], [address], [place], [place] [address]    Report Status PENDING  Incomplete  Culture, blood (Routine X 2) w Reflex to ID Panel     Status: None (Preliminary result)   Collection Time: 04/25/21 11:22 AM   Specimen: BLOOD  Result Value Ref Range Status   Specimen Description BLOOD BLOOD LEFT HAND  Final   Special Requests   Final    BOTTLES DRAWN AEROBIC AND ANAEROBIC Blood Culture adequate volume   Culture  Setup Time   Final    Organism ID to follow GRAM POSITIVE COCCI AEROBIC BOTTLE ONLY CRITICAL RESULT CALLED TO, READ BACK BY AND VERIFIED WITH: [facility] MITVHELL 04/26/21 7001 SJL Performed at [facility], [address], [place], [place] [address]    Culture Lonell Grandchild  POSITIVE COCCI  Final   Report Status PENDING  Incomplete  Blood Culture ID Panel (Reflexed)     Status: Abnormal   Collection Time: 04/25/21 11:22 AM  Result Value Ref Range Status   Enterococcus faecalis NOT DETECTED NOT DETECTED Final   Enterococcus Faecium NOT DETECTED NOT DETECTED Final   Listeria monocytogenes NOT DETECTED NOT DETECTED Final   Staphylococcus species DETECTED (A) NOT DETECTED Final    Comment: CRITICAL RESULT CALLED TO, READ BACK BY AND VERIFIED WITH: DEVIN MITCHELL 04/26/21 0956 SJL    Staphylococcus aureus (BCID) NOT DETECTED NOT DETECTED Final   Staphylococcus epidermidis DETECTED (A) NOT DETECTED Final    Comment: CRITICAL RESULT CALLED TO, READ BACK BY AND VERIFIED WITH: DEVIN MITCHELL 04/26/21 0956 SJL    Staphylococcus lugdunensis NOT DETECTED NOT DETECTED Final   Streptococcus species NOT DETECTED NOT DETECTED Final   Streptococcus agalactiae NOT DETECTED NOT DETECTED Final   Streptococcus pneumoniae NOT DETECTED NOT DETECTED Final   Streptococcus pyogenes NOT DETECTED NOT DETECTED Final   A.calcoaceticus-baumannii NOT DETECTED NOT  DETECTED Final   Bacteroides fragilis NOT DETECTED NOT DETECTED Final   Enterobacterales NOT DETECTED NOT DETECTED Final   Enterobacter cloacae complex NOT DETECTED NOT DETECTED Final   Escherichia coli NOT DETECTED NOT DETECTED Final   Klebsiella aerogenes NOT DETECTED NOT DETECTED Final   Klebsiella oxytoca NOT DETECTED NOT DETECTED Final   Klebsiella pneumoniae NOT DETECTED NOT DETECTED Final   Proteus species NOT DETECTED NOT DETECTED Final   Salmonella species NOT DETECTED NOT DETECTED Final   Serratia marcescens NOT DETECTED NOT DETECTED Final   Haemophilus influenzae NOT DETECTED NOT DETECTED Final   Neisseria meningitidis NOT DETECTED NOT DETECTED Final   Pseudomonas aeruginosa NOT DETECTED NOT DETECTED Final   Stenotrophomonas maltophilia NOT DETECTED NOT DETECTED Final   Candida albicans NOT DETECTED NOT DETECTED Final   Candida auris NOT DETECTED NOT DETECTED Final   Candida glabrata NOT DETECTED NOT DETECTED Final   Candida krusei NOT DETECTED NOT DETECTED Final   Candida parapsilosis NOT DETECTED NOT DETECTED Final   Candida tropicalis NOT DETECTED NOT DETECTED Final   Cryptococcus neoformans/gattii NOT DETECTED NOT DETECTED Final   Methicillin resistance mecA/C NOT DETECTED NOT DETECTED Final    Comment: Performed at [facility], [address], [place], [place] [address]         Radiology Studies: DG Chest Port 1 View  Result Date: 04/25/2021 CLINICAL DATA:  Fever.  Prostate cancer with metastases to the bone. EXAM: PORTABLE CHEST 1 VIEW COMPARISON:  March 30, 2021 FINDINGS: The study is limited due to the kyphotic positioning. No pneumothorax. Diffuse widespread bony metastatic disease. The heart, hila, and visualized mediastinum are unremarkable although the superior mediastinum is not well seen. No pulmonary nodules, masses, or definitive focal infiltrates. IMPRESSION: The study is limited due to positioning. A better positioned PA and lateral chest x-ray  could better evaluate the lungs. Within these limitations, no cause for fever identified. Widespread bony metastatic disease. Electronically Signed   By: Dorise Bullion III M.D   On: 04/25/2021 12:54        Scheduled Meds:  enoxaparin (LOVENOX) injection  30 mg Subcutaneous Q24H   feeding supplement  237 mL Oral BID BM   ferrous sulfate  325 mg Oral Q breakfast   folic acid  1 mg Oral Daily   megestrol  400 mg Oral Daily   melatonin  5 mg Oral QHS   midodrine  10 mg  Oral TID WC   multivitamin with minerals  1 tablet Oral Daily   polyethylene glycol  17 g Oral Daily   Vitamin D (Ergocalciferol)  50,000 Units Oral Q7 days   Continuous Infusions:  cefTRIAXone (ROCEPHIN)  IV       LOS: 27 days        Hosie Poisson, MD [facility]   To contact the attending provider between 7A-7P or the covering provider during after hours 7P-7A, please log into the web site [email or web address] and access using universal [place] password for that web site. If you do not have the password, please call the hospital operator.  04/26/2021, 4:34 PM

## 2021-04-26 NOTE — Progress Notes (Signed)
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 TLSO recommended from nurosurgery and devliered to room.    PT Comments    Pt is making gradual progress towards goals. REports he just finished getting OOB with RN staff for [facility] and now is too tired to ambulate at this time. Agreeable for another day. With much encouragement, does participate in supine there-ex. Will continue to progress towards goals.   Follow Up Recommendations  SNF     Equipment Recommendations  None recommended by PT    Recommendations for Other Services       Precautions / Restrictions Precautions Precautions: Fall Restrictions Weight Bearing Restrictions: No (but complains of L heel pain-has post op shoe available) Other Position/Activity Restrictions: pt refusing brace often, requires increased time and encouragement, low tolerance/threshold for wearing    Mobility  Bed Mobility               General bed mobility comments: refused as he just returned back to bed from getting up to [facility] with RN staff    Transfers                    Ambulation/Gait                 Stairs             Wheelchair Mobility    Modified Rankin (Stroke Patients Only)       Balance                                            Cognition Arousal/Alertness: Awake/alert Behavior During Therapy: WFL for tasks assessed/performed Overall Cognitive Status: Within Functional Limits for tasks assessed                                         Exercises Other Exercises Other Exercises: supine ther-ex performed on B LE including SLRs, hip abd/add, and heel slides. 10 reps performed with supervision    General Comments        Pertinent Vitals/Pain Pain Assessment: No/denies pain    Home Living                      Prior Function            PT Goals (current goals can now be found in the care plan section) Acute Rehab PT Goals Patient Stated Goal: to get stronger PT Goal Formulation: With patient Time For Goal Achievement: 04/19/21 Potential to Achieve Goals: Fair Progress towards PT goals: Progressing toward goals    Frequency    Min 2X/week      PT Plan Current plan remains appropriate    Co-evaluation              AM-PAC PT "6 Clicks" Mobility   Outcome Measure  Help needed turning from your back to  your side while in a flat bed without using bedrails?: A Little Help needed moving from lying on your back to sitting on the side of a flat bed without using bedrails?: A Little Help needed moving to and from a bed to a chair (including a wheelchair)?: A Little Help needed standing up from a chair using your arms (e.g., wheelchair or bedside chair)?: A Little Help needed to walk in hospital room?: A Little Help needed climbing 3-5 steps with a railing? : A Little 6 Click Score: 18    End of Session   Activity Tolerance: Patient tolerated treatment well Patient left: in bed;with call bell/phone within reach;with bed alarm set Nurse Communication: Mobility status PT Visit Diagnosis: Unsteadiness on feet (R26.81);Muscle weakness (generalized) (M62.81);Difficulty in walking, not elsewhere classified (R26.2)     Time: 4158-3094 PT Time Calculation (min) (ACUTE ONLY): 16 min  Charges:  $Therapeutic Exercise: 8-22 mins                     Greggory Stallion, PT, DPT [phone]    [name] 04/26/2021, 1:20 PM

## 2021-04-26 NOTE — Progress Notes (Signed)
PHARMACY - PHYSICIAN COMMUNICATION CRITICAL VALUE ALERT - BLOOD CULTURE IDENTIFICATION (BCID)  Ryan [name] is an 60 y.o. male who presented to [name] on 03/30/2021 with a chief complaint of hip pain  Assessment:  7/17 blood cultures with GPC 1 of 4 bottles, BCID = MSSE.  Cultures ordered for Tm = 100.5.  no known source of infection  Name of physician (or Provider) Contacted: Dr Karleen Hampshire  Current antibiotics: none  Changes to prescribed antibiotics recommended:  Patient is on recommended antibiotics - No changes needed - monitor off antibiotics. Suspect contaminant  Results for orders placed or performed during the hospital encounter of 03/30/21  Blood Culture ID Panel (Reflexed) (Collected: 04/25/2021 11:22 AM)  Result Value Ref Range   Enterococcus faecalis NOT DETECTED NOT DETECTED   Enterococcus Faecium NOT DETECTED NOT DETECTED   Listeria monocytogenes NOT DETECTED NOT DETECTED   Staphylococcus species DETECTED (A) NOT DETECTED   Staphylococcus aureus (BCID) NOT DETECTED NOT DETECTED   Staphylococcus epidermidis DETECTED (A) NOT DETECTED   Staphylococcus lugdunensis NOT DETECTED NOT DETECTED   Streptococcus species NOT DETECTED NOT DETECTED   Streptococcus agalactiae NOT DETECTED NOT DETECTED   Streptococcus pneumoniae NOT DETECTED NOT DETECTED   Streptococcus pyogenes NOT DETECTED NOT DETECTED   A.calcoaceticus-baumannii NOT DETECTED NOT DETECTED   Bacteroides fragilis NOT DETECTED NOT DETECTED   Enterobacterales NOT DETECTED NOT DETECTED   Enterobacter cloacae complex NOT DETECTED NOT DETECTED   Escherichia coli NOT DETECTED NOT DETECTED   Klebsiella aerogenes NOT DETECTED NOT DETECTED   Klebsiella oxytoca NOT DETECTED NOT DETECTED   Klebsiella pneumoniae NOT DETECTED NOT DETECTED   Proteus species NOT DETECTED NOT DETECTED   Salmonella species NOT DETECTED NOT DETECTED   Serratia marcescens NOT DETECTED NOT DETECTED   Haemophilus influenzae NOT DETECTED NOT DETECTED    Neisseria meningitidis NOT DETECTED NOT DETECTED   Pseudomonas aeruginosa NOT DETECTED NOT DETECTED   Stenotrophomonas maltophilia NOT DETECTED NOT DETECTED   Candida albicans NOT DETECTED NOT DETECTED   Candida auris NOT DETECTED NOT DETECTED   Candida glabrata NOT DETECTED NOT DETECTED   Candida krusei NOT DETECTED NOT DETECTED   Candida parapsilosis NOT DETECTED NOT DETECTED   Candida tropicalis NOT DETECTED NOT DETECTED   Cryptococcus neoformans/gattii NOT DETECTED NOT DETECTED   Methicillin resistance mecA/C NOT DETECTED NOT DETECTED    Doreene Eland, PharmD, BCPS.   Work Cell: [phone] 04/26/2021 10:25 AM

## 2021-04-27 LAB — CBC
HCT: 22.9 % — ABNORMAL LOW (ref 39.0–52.0)
Hemoglobin: 7.5 g/dL — ABNORMAL LOW (ref 13.0–17.0)
MCH: 26.5 pg (ref 26.0–34.0)
MCHC: 32.8 g/dL (ref 30.0–36.0)
MCV: 80.9 fL (ref 80.0–100.0)
Platelets: 192 10*3/uL (ref 150–400)
RBC: 2.83 MIL/uL — ABNORMAL LOW (ref 4.22–5.81)
RDW: 17.4 % — ABNORMAL HIGH (ref 11.5–15.5)
WBC: 8.4 10*3/uL (ref 4.0–10.5)
nRBC: 0 % (ref 0.0–0.2)

## 2021-04-27 LAB — CULTURE, BLOOD (ROUTINE X 2): Special Requests: ADEQUATE

## 2021-04-27 MED ORDER — GUAIFENESIN-DM 100-10 MG/5ML PO SYRP
5.0000 mL | ORAL_SOLUTION | Freq: Three times a day (TID) | ORAL | Status: DC | PRN
  Administered 2021-04-27 – 2021-05-09 (×16): 5 mL via ORAL
  Filled 2021-04-27 (×17): qty 5

## 2021-04-27 NOTE — Progress Notes (Signed)
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 TLSO recommended from nurosurgery and devliered to room.    PT Comments    Treatment performed this date with goals renewed. No change in current status. Pt takes max encouragement to participate and has multiple complaints of therapy staff pushing him too rigorously and not allowing for rest. Discussed that on previous date, he was too fatigued to work with therapy for OOB mobility and had agreed this date. Pt only willing to ambulate short distance in room. Still complains of L heel pain. Skin inspected with no redness noted. Padding applied as well as off loading shoe. Encouraged pt to continue with OOB mobility at meal times. Will continue to progress as able.    Follow Up Recommendations  SNF     Equipment Recommendations  None recommended by PT    Recommendations for Other Services       Precautions / Restrictions Precautions Precautions: Fall Required Braces or Orthoses: Spinal Brace Spinal Brace: Thoracolumbosacral orthotic Restrictions Weight Bearing Restrictions: No Other Position/Activity Restrictions: refuses to wear brace    Mobility  Bed Mobility Overal bed mobility: Needs Assistance Bed Mobility: Supine to Sit     Supine to sit: Min assist Sit to supine: Min guard   General bed mobility comments: min assist for mobility efforts, however noted poor effort made by patient. Once seated at EOB, able to sit with independence    Transfers Overall  transfer level: Needs assistance Equipment used: Rolling walker (2 wheeled) Transfers: Sit to/from Stand Sit to Stand: Min guard         General transfer comment: off loading boot donned prior to transfers. Needed bed elevated prior to standing  Ambulation/Gait Ambulation/Gait assistance: Min guard Gait Distance (Feet): 30 Feet Assistive device: Rolling walker (2 wheeled) Gait Pattern/deviations: Step-to pattern     General Gait Details: pt is self limiting with little motivation to perform OOB. Narrow BOS. Decent balance with ability to complete turns safely.   Stairs             Wheelchair Mobility    Modified Rankin (Stroke Patients Only)       Balance Overall balance assessment: Needs assistance Sitting-balance support: Feet supported;Bilateral upper extremity supported Sitting balance-Leahy Scale: Good     Standing balance support: Bilateral upper extremity supported;During functional activity Standing balance-Leahy Scale: Fair                              Cognition Arousal/Alertness: Awake/alert Behavior During Therapy: WFL for tasks assessed/performed Overall Cognitive Status: Within Functional Limits for tasks assessed                                 General Comments: alert and oriented, generally grumpy about staff motivating patient to work with therapy      Exercises      General Comments        Pertinent Vitals/Pain Pain Assessment: Faces Faces Pain Scale: Hurts little more  Pain Location: L heel Pain Descriptors / Indicators: Discomfort Pain Intervention(s): Limited activity within patient's tolerance;Repositioned    Home Living                      Prior Function            PT Goals (current goals can now be found in the care plan section) Acute Rehab PT Goals Patient Stated Goal: to get stronger PT Goal Formulation: With patient Time For Goal Achievement: 05/11/21 Potential to Achieve  Goals: Fair Progress towards PT goals: Progressing toward goals    Frequency    Min 2X/week      PT Plan Current plan remains appropriate    Co-evaluation              AM-PAC PT "6 Clicks" Mobility   Outcome Measure  Help needed turning from your back to your side while in a flat bed without using bedrails?: A Little Help needed moving from lying on your back to sitting on the side of a flat bed without using bedrails?: A Little Help needed moving to and from a bed to a chair (including a wheelchair)?: A Little Help needed standing up from a chair using your arms (e.g., wheelchair or bedside chair)?: A Little Help needed to walk in hospital room?: A Little Help needed climbing 3-5 steps with a railing? : A Little 6 Click Score: 18    End of Session   Activity Tolerance: Patient limited by pain Patient left: in bed;with bed alarm set Nurse Communication: Mobility status PT Visit Diagnosis: Unsteadiness on feet (R26.81);Muscle weakness (generalized) (M62.81);Difficulty in walking, not elsewhere classified (R26.2)     Time: 6301-6010 PT Time Calculation (min) (ACUTE ONLY): 24 min  Charges:  $Gait Training: 23-37 mins                     Greggory Stallion, [place], DPT [phone]    Tametha Banning 04/27/2021, 1:33 PM

## 2021-04-27 NOTE — Progress Notes (Signed)
PROGRESS NOTE    Ryan [name]  FKC:[id number] DOB: Jul 04, 1961 DOA: 03/30/2021 PCP: Pcp, No   Chief Complaint  Patient presents with   Pain Management    Brief Narrative: Ryan [name] is a 60 y.o. male with medical history significant for history of polysubstance abuse including THC, cocaine, alcohol, remote history of tobacco use, history of right proximal femur inter trochanteric fracture status post right IM nail intertrochanteric fixation in 04/02/2021, does not follow-up with a PCP, presents to the emergency department for chief concerns of right hip pain.  Pain is chronic for several months, denies any trauma, difficulty ambulation due to pain. He also endorses an unintentional 35 pound weight loss in the last year, denies bright red blood per rectum and melena. patient was found to have L5 compression fracture, L3/4 severe stenosis.  She was also found to have widespread bony metastasis  from prostate cancer. Patient is a followed by oncology.  He needed SNF placement, not able to find accepting facility. He is intermittently refusing some meds.   He Was febrile over the weekend and his blood cultures show 1/4 bottles GPC, possibly contaminant. He has frequent urination and occasional dry cough. CXR unremarkable abut UA is positive for nitrites, rare bacteria. Urine cultures and repeat Blood cultures ordered, if they are negative will discontinue antibiotics in the next 24 to 48 hours.   Pt seen and examined at bedside, sleepy no new complaints.    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 Neurosurgery consulted recommended not a surgical candidate Pain control. Recommend outpatient follow up with oncology for treatment.  Suspect he will remain in the hospital the end of the month   Severe symptomatic anemia with folic acid deficiency Transfuse to keep hemoglobin greater than 7.     Hyponatremia:  Improved to 134. asymptomatic   Hypokalemia ,anion gap metabolic acidosis Resolved.    Cancer cachexia with severe protein calorie malnutrition Dietary on board recommend encouraging oral intake.  Left heel pain:  X rays of the foot ordered to evaluate for pathological fractures.  Negative.  Increased oxycodone 1 to 2 pill every 6 hours prn.   Hypotension:  Asymptomatic, keep MAP greater than 65, on Midodrine.  Resolved.    Fever: Blood cultures ordered, get CXR and UA.  Was febrile over the weekend and his blood cultures show 1/4 bottles GPC staph epidermidis possibly contaminant. He has frequent urination and occasional dry cough. CXR unremarkable abut UA is positive for nitrites, rare bacteria. Urine cultures and repeat Blood cultures ordered, if they are negative will discontinue antibiotics in the next 24 to 48 hours. Urine cultures showing providentia >1,00,000, follow sensitivities.      DVT prophylaxis: lovenox.  Code Status: full code.  Family Communication: none at bedside.  Disposition:   Status is: Inpatient  Remains inpatient appropriate because:Unsafe d/c plan  Dispo: The patient is from: Home  Anticipated d/c is to: SNF              Patient currently is medically stable to d/c.   Difficult to place patient Yes       Consultants:  Oncology.   Procedures: none.   Antimicrobials: none.   Subjective: Sleepy no new complaints.    Objective: Vitals:   04/26/21 1944 04/27/21 0036 04/27/21 0609 04/27/21 0820  BP: (!) 112/59 103/65 122/80 (!) 96/57  Pulse: 90 (!) 103 99 99  Resp: 18 18 18 18   Temp: 98.2 F (36.8 C) 99.3 F (37.4 C) 98.4 F (36.9 C) 98.3 F (36.8 C)  TempSrc: Oral Oral Oral   SpO2: 99% 99% 99% 98%  Weight:      Height:        Intake/Output Summary (Last 24 hours) at 04/27/2021 0859 Last data filed at 04/27/2021 0300 Gross per 24 hour  Intake 280 ml  Output --  Net 280 ml    Filed Weights   04/01/21 1232 04/08/21 0800 04/14/21 1246  Weight: 54.7 kg 41.6 kg 51.5 kg    Examination:  General exam: Cachetic, ill appearing gentleman, not in distress.  Respiratory system: Air entry fair, no wheezing heard, on RA.  Cardiovascular system: RRR, no JVD, no pedal edema.  Gastrointestinal system: Abdomen is soft, NT BS+ Central nervous system: sleepy this morning, grossly non focal.  Extremities:  no pedal edema, muscle wasting seen on extremities.  Skin: No rashes seen.  Psychiatry: Mood  is appropriate.     Data Reviewed: I have personally reviewed following labs and imaging studies  CBC: Recent Labs  Lab 04/24/21 0551 04/27/21 0453  WBC 6.7 8.4  HGB 8.0* 7.5*  HCT 24.3* 22.9*  MCV 80.7 80.9  PLT 213 192     Basic Metabolic Panel: Recent Labs  Lab 04/24/21 0551  NA 134*  K 4.3  CL 106  CO2 21*  GLUCOSE 119*  BUN 14  CREATININE 0.46*  CALCIUM 8.0*     GFR: Estimated Creatinine Clearance: 72.4 mL/min (A) (by C-G formula based on SCr of 0.46 mg/dL (L)).  Liver Function Tests: No results for input(s): AST, ALT, ALKPHOS, BILITOT, PROT, ALBUMIN in the last 168 hours.  CBG: No results for input(s): GLUCAP in the last 168 hours.   Recent Results (from the past 240 hour(s))  Culture, blood (Routine X 2) w Reflex to ID Panel     Status: None (Preliminary result)   Collection Time: 04/25/21 11:21 AM   Specimen: BLOOD  Result Value Ref Range Status   Specimen  Description BLOOD BLOOD RIGHT HAND  Final   Special Requests   Final    BOTTLES DRAWN AEROBIC AND ANAEROBIC Blood Culture adequate volume   Culture   Final    NO GROWTH 2 DAYS Performed at [facility], [address] [place], [place] [address]    Report Status PENDING  Incomplete  Culture, blood (Routine X 2) w Reflex to ID Panel     Status: Abnormal   Collection Time: 04/25/21 11:22 AM   Specimen: BLOOD  Result Value Ref Range Status   Specimen Description   Final    BLOOD BLOOD LEFT HAND Performed at [facility], [address] [place], [place] [address]    Special Requests   Final    BOTTLES DRAWN AEROBIC AND ANAEROBIC Blood Culture adequate volume Performed at [facility], [address], [place], [place] [address]    Culture  Setup  Time   Final    GRAM POSITIVE COCCI AEROBIC BOTTLE ONLY CRITICAL RESULT CALLED TO, READ BACK BY AND VERIFIED WITH: DEVIN MITVHELL 04/26/21 0956 [facility]    Culture (A)  Final    STAPHYLOCOCCUS EPIDERMIDIS THE SIGNIFICANCE OF ISOLATING THIS ORGANISM FROM A SINGLE SET OF BLOOD CULTURES WHEN MULTIPLE SETS ARE DRAWN IS UNCERTAIN. PLEASE NOTIFY THE MICROBIOLOGY DEPARTMENT WITHIN ONE WEEK IF SPECIATION AND SENSITIVITIES ARE REQUIRED. Performed at [facility], [place] [address], [place], [place] [address]    Report Status 04/27/2021 FINAL  Final  Blood Culture ID Panel (Reflexed)     Status: Abnormal   Collection Time: 04/25/21 11:22 AM  Result Value Ref Range Status   Enterococcus faecalis NOT DETECTED NOT DETECTED Final   Enterococcus Faecium NOT DETECTED NOT DETECTED Final   Listeria monocytogenes NOT DETECTED NOT DETECTED Final   Staphylococcus species DETECTED (A) NOT DETECTED Final    Comment: CRITICAL RESULT CALLED TO, READ BACK BY AND VERIFIED WITH: DEVIN MITCHELL 04/26/21 0956 [facility]    Staphylococcus aureus (BCID) NOT DETECTED NOT DETECTED Final   Staphylococcus epidermidis DETECTED (A) NOT DETECTED Final     Comment: CRITICAL RESULT CALLED TO, READ BACK BY AND VERIFIED WITH: DEVIN MITCHELL 04/26/21 0956 [facility]    Staphylococcus lugdunensis NOT DETECTED NOT DETECTED Final   Streptococcus species NOT DETECTED NOT DETECTED Final   Streptococcus agalactiae NOT DETECTED NOT DETECTED Final   Streptococcus pneumoniae NOT DETECTED NOT DETECTED Final   Streptococcus pyogenes NOT DETECTED NOT DETECTED Final   A.calcoaceticus-baumannii NOT DETECTED NOT DETECTED Final   Bacteroides fragilis NOT DETECTED NOT DETECTED Final   Enterobacterales NOT DETECTED NOT DETECTED Final   Enterobacter cloacae complex NOT DETECTED NOT DETECTED Final   Escherichia coli NOT DETECTED NOT DETECTED Final   Klebsiella aerogenes NOT DETECTED NOT DETECTED Final   Klebsiella oxytoca NOT DETECTED NOT DETECTED Final   Klebsiella pneumoniae NOT DETECTED NOT DETECTED Final   Proteus species NOT DETECTED NOT DETECTED Final   Salmonella species NOT DETECTED NOT DETECTED Final   Serratia marcescens NOT DETECTED NOT DETECTED Final   Haemophilus influenzae NOT DETECTED NOT DETECTED Final   Neisseria meningitidis NOT DETECTED NOT DETECTED Final   Pseudomonas aeruginosa NOT DETECTED NOT DETECTED Final   Stenotrophomonas maltophilia NOT DETECTED NOT DETECTED Final   Candida albicans NOT DETECTED NOT DETECTED Final   Candida auris NOT DETECTED NOT DETECTED Final   Candida glabrata NOT DETECTED NOT DETECTED Final   Candida krusei NOT DETECTED NOT DETECTED Final   Candida parapsilosis NOT DETECTED NOT DETECTED Final   Candida tropicalis NOT DETECTED NOT DETECTED Final   Cryptococcus neoformans/gattii NOT DETECTED NOT DETECTED Final   Methicillin resistance mecA/C NOT DETECTED NOT DETECTED Final    Comment: Performed at [facility], [address] [place], [place] [address]  CULTURE, BLOOD (ROUTINE X 2) w Reflex to ID Panel     Status: None (Preliminary result)   Collection Time: 04/27/21  4:53 AM   Specimen: BLOOD  Result Value  Ref Range Status   Specimen Description BLOOD LEFT FA  Final   Special Requests   Final    BOTTLES DRAWN AEROBIC AND ANAEROBIC Blood Culture adequate volume   Culture   Final    NO GROWTH < 12 HOURS Performed at [facility], [address] [place], [place] [address]    Report Status PENDING  Incomplete  CULTURE, BLOOD (ROUTINE X 2) w Reflex to ID Panel  Status: None (Preliminary result)   Collection Time: 04/27/21  4:53 AM   Specimen: BLOOD LEFT HAND  Result Value Ref Range Status   Specimen Description BLOOD LEFT HAND  Final   Special Requests   Final    BOTTLES DRAWN AEROBIC AND ANAEROBIC Blood Culture adequate volume   Culture   Final    NO GROWTH < 12 HOURS Performed at [facility], [address] [place], [place] [address]    Report Status PENDING  Incomplete         Radiology Studies: DG Chest Port 1 View  Result Date: 04/25/2021 CLINICAL DATA:  Fever.  Prostate cancer with metastases to the bone. EXAM: PORTABLE CHEST 1 VIEW COMPARISON:  March 30, 2021 FINDINGS: The study is limited due to the kyphotic positioning. No pneumothorax. Diffuse widespread bony metastatic disease. The heart, hila, and visualized mediastinum are unremarkable although the superior mediastinum is not well seen. No pulmonary nodules, masses, or definitive focal infiltrates. IMPRESSION: The study is limited due to positioning. A better positioned PA and lateral chest x-ray could better evaluate the lungs. Within these limitations, no cause for fever identified. Widespread bony metastatic disease. Electronically Signed   By: Dorise Bullion III M.D   On: 04/25/2021 12:54        Scheduled Meds:  enoxaparin (LOVENOX) injection  30 mg Subcutaneous Q24H   feeding supplement  237 mL Oral BID BM   ferrous sulfate  325 mg Oral Q breakfast   folic acid  1 mg Oral Daily   megestrol  400 mg Oral Daily   melatonin  5 mg Oral QHS   midodrine  10 mg Oral TID WC   multivitamin with  minerals  1 tablet Oral Daily   polyethylene glycol  17 g Oral Daily   Vitamin D (Ergocalciferol)  50,000 Units Oral Q7 days   Continuous Infusions:  cefTRIAXone (ROCEPHIN)  IV 1 g (04/26/21 1815)     LOS: 28 days        Hosie Poisson, MD [facility]   To contact the attending provider between 7A-7P or the covering provider during after hours 7P-7A, please log into the web site [email or web address] and access using universal [place] password for that web site. If you do not have the password, please call the hospital operator.  04/27/2021, 8:59 AM

## 2021-04-27 NOTE — Evaluation (Signed)
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 TLSO recommended from nurosurgery and devliered to room.   Clinical Impression   Pt seen this session for re-evaluation of goals. This was second attempt to see pt today and this session was scheduled/planned based on time that would work best for pt in order to increase participation. Pt requires maximal encouragement and is often self limiting during session. OT asking pt about his goals for therapy and pt is not clear about his expectations but just reports that he is weak. Pt requesting mesh brief and pants. He declined OOB activity and bridges in bed to don LB clothing himself with set up. OT exiting the room at this time. Goals upgraded and OT will continue to see pt to address functional deficits before discharge.     Follow Up Recommendations  SNF    Equipment Recommendations  Other (comment);3 in 1 bedside commode;Tub/shower seat       Precautions / Restrictions Precautions Precautions: Fall Required Braces or Orthoses: Spinal Brace Spinal Brace: Thoracolumbosacral orthotic Restrictions Weight Bearing Restrictions: No Other Position/Activity Restrictions: refuses to wear brace      Mobility Bed Mobility Overal bed mobility: Needs Assistance Bed Mobility: Supine to Sit     Supine to sit: Min assist Sit to supine: Min guard   General bed mobility comments: min assist for mobility efforts, however noted poor effort made by patient. Once seated at EOB, able  to sit with independence    Transfers Overall transfer level: Needs assistance Equipment used: Rolling walker (2 wheeled) Transfers: Sit to/from Stand Sit to Stand: Min guard         General transfer comment: off loading boot donned prior to transfers. Needed bed elevated prior to standing    Balance Overall balance assessment: Needs assistance Sitting-balance support: Feet supported;Bilateral upper extremity supported Sitting balance-Leahy Scale: Good     Standing balance support: Bilateral upper extremity supported;During functional activity Standing balance-Leahy Scale: Fair                             ADL either performed or assessed with clinical judgement   ADL Overall ADL's : Needs assistance/impaired                     Lower Body Dressing: Set up;Bed level                       Vision Baseline Vision/History: No visual deficits Patient Visual Report: No change from baseline              Pertinent Vitals/Pain Pain Assessment: 0-10 Pain Score: 6  Faces Pain Scale: Hurts little more Pain Location: L heel Pain Descriptors / Indicators: Discomfort Pain Intervention(s): Limited activity within patient's tolerance;Repositioned;Monitored during session           Communication     Cognition Arousal/Alertness: Awake/alert Behavior During Therapy: WFL for tasks assessed/performed Overall Cognitive Status: Within Functional Limits for tasks assessed  General Comments: needing maximal encouragment    Prior Functioning/Environment                                 OT Goals(Current goals can be found in the care plan section) Acute Rehab OT Goals Patient Stated Goal: to get stronger OT Goal Formulation: With patient Time For Goal Achievement: 05/11/21 ADL Goals Pt Will Transfer to Toilet: ambulating;with supervision  OT Frequency: Min 1X/week    AM-PAC OT "6 Clicks"  Daily Activity     Outcome Measure Help from another person eating meals?: None Help from another person taking care of personal grooming?: None Help from another person toileting, which includes using toliet, bedpan, or urinal?: A Little Help from another person bathing (including washing, rinsing, drying)?: A Little Help from another person to put on and taking off regular upper body clothing?: None Help from another person to put on and taking off regular lower body clothing?: A Little 6 Click Score: 21   End of Session Nurse Communication: Mobility status  Activity Tolerance: Other (comment);Patient limited by fatigue (Pt is self limiting) Patient left: in chair;with call bell/phone within reach  OT Visit Diagnosis: Unsteadiness on feet (R26.81);Muscle weakness (generalized) (M62.81) Pain - Right/Left: Right                Time: 1610-9604 OT Time Calculation (min): 24 min Charges:  OT General Charges $OT Visit: 1 Visit OT Evaluation $OT Re-eval: 1 Re-eval OT Treatments $Self Care/Home Management : 8-22 mins  Darleen Crocker, MS, OTR/L , CBIS ascom [phone]  04/27/21, 4:07 PM

## 2021-04-27 NOTE — Progress Notes (Signed)
Nutrition Follow-up  DOCUMENTATION CODES:  Severe malnutrition in context of chronic illness, Underweight  INTERVENTION:  Encourage PO intake, continue current diet as ordered Continue double protein portions with meals. Continue Ensure Enlive BID, each supplement provides 350 kcal and 20 grams of protein . Continue snacks TID Continue MVI with minerals daily.  Pt would benefit from feeding tube placement and nocturnal feeds if agreeable.  NUTRITION DIAGNOSIS:  Severe Malnutrition (in the context of chronic illness) related to poor appetite as evidenced by severe muscle depletion, severe fat depletion.  - ongoing  GOAL:  Patient will meet greater than or equal to 90% of their needs  - not meeting  MONITOR:  PO intake, Weight trends  REASON FOR ASSESSMENT:  Consult Assessment of nutrition requirement/status  ASSESSMENT:  60 y.o. male presented to ED for intermittent right lateral hip pain that is been ongoing for the past several weeks. Pt had surgery 10 months ago but reports he has not followed-up for pain. Imaging in ED concerning for marrow infiltrative process. PMH relevant for polysubstance abuse.  Found to have prostate cancer with widespread bony metastasis.  Pt continues to have poor PO intake and is selective with his foods. Pt needs a feeding tube to assist with meeting nutrition needs as he is not willing to consume adequate amounts to promote weight gain and adequate nourishment. It is highly unlikely that pt would allow this as he is hesitant to participate in care and therapy.  Discussed in rounds, CM continues to work on discharge planning. Difficult due to pt's lack of insurance.  Average Meal Intake: 6/26-6/28: ~32% x 4 recorded meals (0-100%) 6/29-7/6: 48% intake x 6 recorded meals (15-90%) 7/7-7/12: 50% intake x 5 recorded meals (0-100%) 7/13-7/19: 30% intake x 2 recorded meals (30%)  Nutritionally Relevant Medications: Scheduled Meds:  feeding  supplement  237 mL Oral BID BM   ferrous sulfate  325 mg Oral Q breakfast   folic acid  1 mg Oral Daily   megestrol  400 mg Oral Daily   multivitamin with minerals  1 tablet Oral Daily   polyethylene glycol  17 g Oral Daily   Vitamin D (Ergocalciferol)  50,000 Units Oral Q7 days   Continuous Infusions:  cefTRIAXone (ROCEPHIN)  IV 1 g (04/26/21 1815)   PRN Meds: bisacodyl, ondansetron  Labs Reviewed  Diet Order:   Diet Order             Diet regular Room service appropriate? Yes; Fluid consistency: Thin; Fluid restriction: 1500 mL Fluid  Diet effective now                  EDUCATION NEEDS:  Education needs have been addressed  Skin:  Skin Assessment: Reviewed RN Assessment  Last BM:  7/18  Height:  Ht Readings from Last 1 Encounters:  03/30/21 6\' 5"  (1.956 m)   Weight:  Wt Readings from Last 1 Encounters:  04/14/21 51.5 kg   Ideal Body Weight:  94.5 kg  BMI:  Body mass index is 13.46 kg/m.  Estimated Nutritional Needs:  Kcal:  2400-2600 kcal/d Protein:  130-150 g/d Fluid:  >2.5L/d  Ranell Patrick, RD, LDN Clinical Dietitian Pager on [place]

## 2021-04-27 NOTE — TOC Progression Note (Signed)
Transition of Care [facility]) - Progression Note    Patient Details  Name: Ryan [name] MRN: [id number] Date of Birth: [birth year]  Transition of Care [facility]) CM/SW [place], RN Phone Number: 04/27/2021, 1:12 PM  Clinical Narrative:    Left message with APS, continuing to wait for Medicaid finalization. TOC to follow to discharge.        Expected Discharge Plan and Services                                                 Social Determinants of Health (SDOH) Interventions    Readmission Risk Interventions Readmission Risk Prevention Plan 04/23/2021  Transportation Screening Complete  PCP or Specialist Appt within 3-5 Days Complete  HRI or Advance Complete  Social Work Consult for [place] Planning/Counseling Not Complete  SW consult not completed comments RNCM assigned to patient  Palliative Care Screening Complete  Medication Review Press photographer) Complete  Some recent data might be hidden

## 2021-04-28 LAB — URINE CULTURE: Culture: >=100000 — AB

## 2021-04-28 MED ORDER — SULFAMETHOXAZOLE-TRIMETHOPRIM 800-160 MG PO TABS
1.0000 | ORAL_TABLET | Freq: Two times a day (BID) | ORAL | Status: AC
  Administered 2021-04-28 – 2021-05-02 (×8): 1 via ORAL
  Filled 2021-04-28 (×9): qty 1

## 2021-04-28 NOTE — Progress Notes (Signed)
PROGRESS NOTE    Ryan [name]  PIR:[id number] DOB: [birth year] DOA: 03/30/2021 PCP: Pcp, No   Chief Complaint  Patient presents with   Pain Management    Brief Narrative: Ryan [name] is a 60 y.o. male with medical history significant for history of polysubstance abuse including THC, cocaine, alcohol, remote history of tobacco use, history of right proximal femur inter trochanteric fracture status post right IM nail intertrochanteric fixation in 04/02/2021, does not follow-up with a PCP, presents to the emergency department for chief concerns of right hip pain.  Pain is chronic for several months, denies any trauma, difficulty ambulation due to pain. He also endorses an unintentional 35 pound weight loss in the last year, denies bright red blood per rectum and melena. patient was found to have L5 compression fracture, L3/4 severe stenosis.  She was also found to have widespread bony metastasis  from prostate cancer. Patient is a followed by oncology.  He needed SNF placement, not able to find accepting facility. He is intermittently refusing some meds.   He Was febrile over the weekend and his blood cultures show 1/4 bottles GPC, possibly contaminant. He has frequent urination and occasional dry cough. CXR unremarkable abut UA is positive for nitrites, rare bacteria. Urine cultures and repeat Blood cultures ordered, if they are negative will discontinue antibiotics in the next 24 to 48 hours.   Pt seen and examined at bedside, no new complaints.    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 Neurosurgery consulted recommended not a surgical candidate Pain control. Recommend outpatient follow up with oncology for treatment.  Suspect he will remain in the hospital the end of the month   Severe symptomatic anemia with folic acid deficiency Transfuse to keep hemoglobin greater than 7.     Hyponatremia:  Improved to 134. asymptomatic   Hypokalemia ,anion gap metabolic acidosis Resolved.    Cancer cachexia with severe protein calorie malnutrition Dietary on board recommend encouraging oral intake.  Left heel pain:  X rays of the foot ordered to evaluate for pathological fractures.  Negative.  Increased oxycodone 1 to 2 pill every 6 hours prn.   Hypotension:  Asymptomatic, keep MAP greater than 65, on Midodrine.  Resolved.    Fever: Blood cultures ordered, get CXR and UA.  Was febrile over the weekend and his blood cultures show 1/4 bottles GPC staph epidermidis possibly contaminant. He has frequent urination and occasional dry cough. CXR unremarkable abut UA is positive for nitrites, rare bacteria. Urine cultures and repeat Blood cultures ordered, if they are negative will discontinue antibiotics in the next 24 to 48 hours. Urine cultures showing providentia >1,00,000, sensitive to ceftriaxone and Bactrim.  S/p ceftriaxone x2 days, changed to Bactrim for 5 days, total 7 days Abx     DVT prophylaxis: lovenox.  Code Status: full code.  Family Communication: none at bedside.  Disposition:   Status is: Inpatient  Remains inpatient appropriate because:Unsafe d/c plan  Dispo: The patient is from: Home              Anticipated d/c is to: SNF              Patient currently is medically stable to d/c.   Difficult to place patient Yes       Consultants:  Oncology.   Procedures: none.    Antimicrobials: ceftriaxone x 2 doses                             Bactrim for 5 days  Subjective: No overnight issues, lying comfortably, still has pain 6/10, no any other active issues.   Objective: Vitals:   04/28/21 0006 04/28/21 0346 04/28/21 0813 04/28/21 1202  BP: 111/63 110/71 97/64 (!) 92/54  Pulse: 93 93 100 96  Resp: 16 18 16 18   Temp: 99 F (37.2 C) 97.7 F (36.5 C) 99 F (37.2 C) 98.2 F (36.8 C)  TempSrc:  Oral  Oral  SpO2: 98% 99% 97% 98%  Weight:      Height:        Intake/Output Summary (Last 24 hours) at 04/28/2021 1241 Last data filed at 04/28/2021 0800 Gross per 24 hour  Intake 340 ml  Output 1250 ml  Net -910 ml   Filed Weights   04/01/21 1232 04/08/21 0800 04/14/21 1246  Weight: 54.7 kg 41.6 kg 51.5 kg    Examination:  General exam: Cachetic, ill appearing gentleman, not in distress.  Respiratory system: Air entry fair, no wheezing heard, on RA.  Cardiovascular system: RRR, no JVD, no pedal edema.  Gastrointestinal system: Abdomen is soft, NT BS+ Central nervous system: CN grossly intact, right lower extremity weakness, no any other focal deficits  Extremities:  no pedal edema, muscle wasting seen on extremities.  Skin: No rashes seen.  Psychiatry: Mood  is appropriate.     Data Reviewed: I have personally reviewed following labs and imaging studies  CBC: Recent Labs  Lab 04/24/21 0551 04/27/21 0453  WBC 6.7 8.4  HGB 8.0* 7.5*  HCT 24.3* 22.9*  MCV 80.7 80.9  PLT 213 630    Basic Metabolic Panel: Recent Labs  Lab 04/24/21 0551  NA 134*  K 4.3  CL 106  CO2 21*  GLUCOSE 119*  BUN 14  CREATININE 0.46*  CALCIUM 8.0*    GFR: Estimated Creatinine Clearance: 72.4 mL/min (A) (by C-G formula based on SCr of 0.46 mg/dL (L)).  Liver Function Tests: No results for input(s): AST, ALT, ALKPHOS, BILITOT, PROT, ALBUMIN in the last 168 hours.  CBG: No results for input(s): GLUCAP in the last 168 hours.   Recent Results (from  the past 240 hour(s))  Culture, blood (Routine X 2) w Reflex to ID Panel     Status: None (Preliminary result)   Collection Time: 04/25/21 11:21 AM   Specimen: BLOOD  Result Value Ref Range Status   Specimen Description BLOOD BLOOD RIGHT HAND  Final   Special Requests   Final    BOTTLES DRAWN AEROBIC AND ANAEROBIC Blood Culture adequate volume   Culture   Final    NO GROWTH 3 DAYS Performed at [facility], [address] [place], [place] [address]    Report Status PENDING  Incomplete  Culture, blood (Routine X 2) w Reflex to ID Panel     Status: Abnormal   Collection Time: 04/25/21 11:22 AM   Specimen: BLOOD  Result Value Ref Range  Status   Specimen Description   Final    BLOOD BLOOD LEFT HAND Performed at [facility], [address] [place], [place] [address]    Special Requests   Final    BOTTLES DRAWN AEROBIC AND ANAEROBIC Blood Culture adequate volume Performed at [facility], [address] [place], [place] [address]    Culture  Setup Time   Final    GRAM POSITIVE COCCI AEROBIC BOTTLE ONLY CRITICAL RESULT CALLED TO, READ BACK BY AND VERIFIED WITH: DEVIN MITVHELL 04/26/21 0956 [facility]    Culture (A)  Final    STAPHYLOCOCCUS EPIDERMIDIS THE SIGNIFICANCE OF ISOLATING THIS ORGANISM FROM A SINGLE SET OF BLOOD CULTURES WHEN MULTIPLE SETS ARE DRAWN IS UNCERTAIN. PLEASE NOTIFY THE MICROBIOLOGY DEPARTMENT WITHIN ONE WEEK IF SPECIATION AND SENSITIVITIES ARE REQUIRED. Performed at [facility], [place] [address] [place], [place] [address]    Report Status 04/27/2021 FINAL  Final  Blood Culture ID Panel (Reflexed)     Status: Abnormal   Collection Time: 04/25/21 11:22 AM  Result Value Ref Range Status   Enterococcus faecalis NOT DETECTED NOT DETECTED Final   Enterococcus Faecium NOT DETECTED NOT DETECTED Final   Listeria monocytogenes NOT DETECTED NOT DETECTED Final   Staphylococcus species DETECTED (A) NOT DETECTED Final    Comment: CRITICAL  RESULT CALLED TO, READ BACK BY AND VERIFIED WITH: DEVIN MITCHELL 04/26/21 0956 [facility]    Staphylococcus aureus (BCID) NOT DETECTED NOT DETECTED Final   Staphylococcus epidermidis DETECTED (A) NOT DETECTED Final    Comment: CRITICAL RESULT CALLED TO, READ BACK BY AND VERIFIED WITH: DEVIN MITCHELL 04/26/21 0956 [facility]    Staphylococcus lugdunensis NOT DETECTED NOT DETECTED Final   Streptococcus species NOT DETECTED NOT DETECTED Final   Streptococcus agalactiae NOT DETECTED NOT DETECTED Final   Streptococcus pneumoniae NOT DETECTED NOT DETECTED Final   Streptococcus pyogenes NOT DETECTED NOT DETECTED Final   A.calcoaceticus-baumannii NOT DETECTED NOT DETECTED Final   Bacteroides fragilis NOT DETECTED NOT DETECTED Final   Enterobacterales NOT DETECTED NOT DETECTED Final   Enterobacter cloacae complex NOT DETECTED NOT DETECTED Final   Escherichia coli NOT DETECTED NOT DETECTED Final   Klebsiella aerogenes NOT DETECTED NOT DETECTED Final   Klebsiella oxytoca NOT DETECTED NOT DETECTED Final   Klebsiella pneumoniae NOT DETECTED NOT DETECTED Final   Proteus species NOT DETECTED NOT DETECTED Final   Salmonella species NOT DETECTED NOT DETECTED Final   Serratia marcescens NOT DETECTED NOT DETECTED Final   Haemophilus influenzae NOT DETECTED NOT DETECTED Final   Neisseria meningitidis NOT DETECTED NOT DETECTED Final   Pseudomonas aeruginosa NOT DETECTED NOT DETECTED Final   Stenotrophomonas maltophilia NOT DETECTED NOT DETECTED Final   Candida albicans NOT DETECTED NOT DETECTED Final   Candida auris NOT DETECTED NOT DETECTED Final   Candida glabrata NOT DETECTED NOT DETECTED Final   Candida krusei NOT DETECTED NOT DETECTED Final   Candida parapsilosis NOT DETECTED NOT DETECTED Final   Candida tropicalis NOT DETECTED NOT DETECTED Final   Cryptococcus neoformans/gattii NOT DETECTED NOT DETECTED Final   Methicillin resistance mecA/C NOT DETECTED NOT DETECTED Final    Comment: Performed at [facility], [address] [place], [place] [address]  Urine Culture     Status: Abnormal   Collection Time: 04/25/21  8:30 PM   Specimen: Urine, Clean Catch  Result Value Ref Range Status   Specimen Description   Final    URINE, CLEAN CATCH Performed at [facility], [address] [place], [place] [address]    Special Requests   Final    NONE Performed at [facility], [address] [place], [place] [address]    Culture >=100,000 COLONIES/mL PROVIDENCIA RETTGERI (A)  Final   Report Status 04/28/2021 FINAL  Final   Organism ID, Bacteria PROVIDENCIA RETTGERI (A)  Final      Susceptibility   Providencia rettgeri - MIC*    AMPICILLIN RESISTANT Resistant     CEFAZOLIN >=64 RESISTANT Resistant     CEFEPIME <=0.12 SENSITIVE Sensitive     CEFTRIAXONE <=0.25 SENSITIVE Sensitive     CIPROFLOXACIN <=0.25 SENSITIVE Sensitive     GENTAMICIN <=1 SENSITIVE Sensitive     IMIPENEM 2 SENSITIVE Sensitive     NITROFURANTOIN 128 RESISTANT Resistant     TRIMETH/SULFA <=20 SENSITIVE Sensitive     AMPICILLIN/SULBACTAM <=2 SENSITIVE Sensitive     PIP/TAZO <=4 SENSITIVE Sensitive     * >=100,000 COLONIES/mL PROVIDENCIA RETTGERI  CULTURE, BLOOD (ROUTINE X 2) w Reflex to ID Panel     Status: None (Preliminary result)   Collection Time: 04/27/21  4:53 AM   Specimen: BLOOD  Result Value Ref Range Status   Specimen Description BLOOD LEFT FA  Final   Special Requests   Final    BOTTLES DRAWN AEROBIC AND ANAEROBIC Blood Culture adequate volume   Culture   Final    NO GROWTH 1 DAY Performed at [facility], [address] [place], [place] [address]    Report Status PENDING  Incomplete  CULTURE, BLOOD (ROUTINE X 2) w Reflex to ID Panel     Status: None (Preliminary result)   Collection Time: 04/27/21  4:53 AM   Specimen: BLOOD LEFT HAND  Result Value Ref Range Status   Specimen Description BLOOD LEFT HAND  Final   Special Requests   Final    BOTTLES DRAWN AEROBIC  AND ANAEROBIC Blood Culture adequate volume   Culture   Final    NO GROWTH 1 DAY Performed at [facility], [address] [place], [place] [address]    Report Status PENDING  Incomplete         Radiology Studies: No results found.      Scheduled Meds:  enoxaparin (LOVENOX) injection  30 mg Subcutaneous Q24H   feeding supplement  237 mL Oral BID BM   ferrous sulfate  325 mg Oral Q breakfast   folic acid  1 mg Oral Daily   megestrol  400 mg Oral Daily   melatonin  5 mg Oral QHS   midodrine  10 mg Oral TID WC   multivitamin with minerals  1 tablet Oral Daily   polyethylene glycol  17 g Oral Daily   sulfamethoxazole-trimethoprim  1 tablet Oral Q12H   Vitamin D (Ergocalciferol)  50,000 Units Oral Q7 days   Continuous Infusions:    LOS: 29 days        Val Riles, MD [facility]   To contact the attending provider between 7A-7P or the covering provider during after hours 7P-7A, please log into the web site [email or web address] and access using universal [place] password for that web site. If you do not have the password, please call the hospital operator.  04/28/2021, 12:41 PM

## 2021-04-28 NOTE — Progress Notes (Signed)
   04/28/21 1616  Clinical Encounter Type  Visited With Patient and family together  Visit Type Follow-up;Spiritual support  Referral From Nurse  Consult/Referral To [place] responded to a page for room 1C-129A Pt, Mr. Kohlton Gilpatrick. Mr Matsuo requested a Bible. While I was visiting Mr. Brink and giving him a Bible his brother and another family came into the room. I introduced myself and we all began to engage in conversation about Mr. Abdel getting healthy so once he is discharged he can attend church again. Mr. Shashank repeatedly asked for [employer] and stated how he wants to visit [employer]. I stated to Mr. Vamsi and his family that Mr. Mariella Saa spoke highly of you and how you were a man of faith and you were also gifted to write songs and play music. I provided words of encouragement and spiritual support and I assured Mr. Viktor and his family [employer] will be visiting him soon. Mr. Tarquin wanted me to sign and date his Bible and I printed To Mr. Jhovanny from [employer] and Thressa Sheller and he and his family smiled and they were all so grateful of the love and support.

## 2021-04-29 LAB — CBC
HCT: 21.2 % — ABNORMAL LOW (ref 39.0–52.0)
Hemoglobin: 7 g/dL — ABNORMAL LOW (ref 13.0–17.0)
MCH: 26.4 pg (ref 26.0–34.0)
MCHC: 33 g/dL (ref 30.0–36.0)
MCV: 80 fL (ref 80.0–100.0)
Platelets: 193 10*3/uL (ref 150–400)
RBC: 2.65 MIL/uL — ABNORMAL LOW (ref 4.22–5.81)
RDW: 17.2 % — ABNORMAL HIGH (ref 11.5–15.5)
WBC: 6.6 10*3/uL (ref 4.0–10.5)
nRBC: 0 % (ref 0.0–0.2)

## 2021-04-29 LAB — BASIC METABOLIC PANEL
Anion gap: 8 (ref 5–15)
BUN: 12 mg/dL (ref 6–20)
CO2: 19 mmol/L — ABNORMAL LOW (ref 22–32)
Calcium: 8.1 mg/dL — ABNORMAL LOW (ref 8.9–10.3)
Chloride: 103 mmol/L (ref 98–111)
Creatinine, Ser: 0.5 mg/dL — ABNORMAL LOW (ref 0.61–1.24)
GFR, Estimated: >60 mL/min (ref >60)
Glucose, Bld: 97 mg/dL (ref 70–99)
Potassium: 4.3 mmol/L (ref 3.5–5.1)
Sodium: 130 mmol/L — ABNORMAL LOW (ref 135–145)

## 2021-04-29 LAB — MAGNESIUM: Magnesium: 1.6 mg/dL — ABNORMAL LOW (ref 1.7–2.4)

## 2021-04-29 LAB — PREPARE RBC (CROSSMATCH)

## 2021-04-29 LAB — OSMOLALITY: Osmolality: 264 mOsm/kg — ABNORMAL LOW (ref 275–295)

## 2021-04-29 LAB — PHOSPHORUS: Phosphorus: 3.4 mg/dL (ref 2.5–4.6)

## 2021-04-29 MED ORDER — SODIUM CHLORIDE 0.9% IV SOLUTION: Freq: Once | INTRAVENOUS | Status: AC

## 2021-04-29 MED ORDER — MAGNESIUM SULFATE 2 GM/50ML IV SOLN
2.0000 g | Freq: Once | INTRAVENOUS | Status: AC
  Administered 2021-04-29: 09:00:00 2 g via INTRAVENOUS
  Filled 2021-04-29: qty 50

## 2021-04-29 NOTE — Progress Notes (Signed)
PROGRESS NOTE    Ryan [name]  RJJ:[id number] DOB: 1961-09-18 DOA: 03/30/2021 PCP: Pcp, No   Chief Complaint  Patient presents with   Pain Management    Brief Narrative: Ryan [name] is a 60 y.o. male with medical history significant for history of polysubstance abuse including THC, cocaine, alcohol, remote history of tobacco use, history of right proximal femur inter trochanteric fracture status post right IM nail intertrochanteric fixation in 04/02/2021, does not follow-up with a PCP, presents to the emergency department for chief concerns of right hip pain.  Pain is chronic for several months, denies any trauma, difficulty ambulation due to pain. He also endorses an unintentional 35 pound weight loss in the last year, denies bright red blood per rectum and melena. patient was found to have L5 compression fracture, L3/4 severe stenosis.  She was also found to have widespread bony metastasis  from prostate cancer. Patient is a followed by oncology.  He needed SNF placement, not able to find accepting facility. He is intermittently refusing some meds.   He Was febrile over the weekend and his blood cultures show 1/4 bottles GPC, possibly contaminant. He has frequent urination and occasional dry cough. CXR unremarkable abut UA is positive for nitrites, rare bacteria. Urine cultures and repeat Blood cultures ordered, if they are negative will discontinue antibiotics in the next 24 to 48 hours.   Pt seen and examined at bedside, no new complaints.    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)   Infestation by bed bug   Prostate cancer metastatic to bone (HCC)    Hypotension   Metastatic prostate cancer with mets to bone with associated L5 compression fracture, L3-L4 severe spinal stenosis with right lower extremity weakness. Neurosurgery consulted recommended not a surgical candidate Pain control. Recommend outpatient follow up with oncology for treatment.  Suspect he will remain in the hospital the end of the month   Severe symptomatic anemia with folic acid deficiency 0/16 Hb 7.0, transfused 1 unit of PRBC Monitor H&H Transfuse to keep hemoglobin greater than 7.   Hypomagnesemia, mag repleted.  Monitor and replete as needed.  Hypotonic hyponatremia: Serum osmolarity 264, continue fluid restriction 1.5 L/day 7/21 Ns 130  asymptomatic Continue to monitor  Hypokalemia ,anion gap metabolic acidosis Resolved.    Cancer cachexia with severe protein calorie malnutrition Dietary on board recommend encouraging oral intake.  Left heel pain:  X rays of the foot ordered to evaluate for pathological fractures.  Negative.  Increased oxycodone 1 to 2 pill every 6 hours prn.   Hypotension:  Asymptomatic, keep MAP greater than 65, on Midodrine 10 mg po TID Stable on Midodrine   Fever: Blood cultures ordered, get CXR and UA.  Was febrile over the weekend and his blood cultures show 1/4 bottles GPC staph epidermidis possibly contaminant. He has frequent urination and occasional dry cough. CXR unremarkable abut UA is positive for nitrites, rare bacteria. Urine cultures and repeat Blood cultures ordered, if they are negative will discontinue antibiotics in the next 24 to 48 hours. Urine cultures showing providentia >1,00,000, sensitive to ceftriaxone and Bactrim.  S/p ceftriaxone x2 days, changed to Bactrim for 5 days, total 7 days  Abx     DVT prophylaxis: lovenox.  Code Status: full code.  Family Communication: none at bedside.  Disposition:   Status is: Inpatient  Remains inpatient appropriate because:Unsafe d/c plan  Dispo: The patient  is from: Home              Anticipated d/c is to: SNF              Patient currently is medically stable to d/c.   Difficult to place patient Yes       Consultants:  Oncology.   Procedures: none.   Antimicrobials: ceftriaxone x 2 doses                             Bactrim for 5 days  Subjective: No overnight issues, lying comfortably, pain is under control, overall patient feels improvement in his condition.   Objective: Vitals:   04/29/21 0002 04/29/21 0344 04/29/21 0816 04/29/21 1133  BP: 101/65 (!) 97/56 98/67 109/67  Pulse: 92 93 100 93  Resp: 16 14 16 18   Temp: 98.4 F (36.9 C) 98.4 F (36.9 C) 98.9 F (37.2 C) 98.9 F (37.2 C)  TempSrc:      SpO2: 98% 98% 99% 100%  Weight:      Height:        Intake/Output Summary (Last 24 hours) at 04/29/2021 1229 Last data filed at 04/29/2021 1106 Gross per 24 hour  Intake 717 ml  Output 325 ml  Net 392 ml   Filed Weights   04/01/21 1232 04/08/21 0800 04/14/21 1246  Weight: 54.7 kg 41.6 kg 51.5 kg    Examination:  General exam: Cachetic, ill appearing gentleman, not in distress.  Respiratory system: Air entry fair, no wheezing heard, on RA.  Cardiovascular system: RRR, no JVD, no pedal edema.  Gastrointestinal system: Abdomen is soft, NT BS+ Central nervous system: CN grossly intact, right lower extremity weakness, no any other focal deficits  Extremities:  no pedal edema, muscle wasting seen on extremities.  Skin: No rashes seen.  Psychiatry: Mood  is appropriate.     Data Reviewed: I have personally reviewed following labs and imaging studies  CBC: Recent Labs  Lab 04/24/21 0551 04/27/21 0453 04/29/21 0600  WBC 6.7 8.4 6.6  HGB 8.0* 7.5* 7.0*  HCT 24.3* 22.9* 21.2*  MCV 80.7 80.9 80.0  PLT 213 192 161    Basic Metabolic Panel: Recent Labs  Lab 04/24/21 0551 04/29/21 0600  NA 134* 130*  K 4.3 4.3  CL 106 103  CO2 21* 19*  GLUCOSE 119* 97  BUN 14 12  CREATININE 0.46* 0.50*  CALCIUM 8.0*  8.1*  MG  --  1.6*  PHOS  --  3.4    GFR: Estimated Creatinine Clearance: 72.4 mL/min (A) (by C-G formula based on SCr of 0.5 mg/dL (L)).  Liver Function Tests: No results for input(s): AST, ALT, ALKPHOS, BILITOT, PROT, ALBUMIN in the last 168 hours.  CBG: No results for input(s): GLUCAP in the last 168 hours.   Recent Results (from the past 240 hour(s))  Culture, blood (Routine X 2) w Reflex to ID Panel     Status: None (Preliminary result)   Collection Time: 04/25/21 11:21 AM   Specimen: BLOOD  Result Value Ref Range Status   Specimen Description BLOOD BLOOD RIGHT HAND  Final   Special Requests   Final    BOTTLES DRAWN AEROBIC AND ANAEROBIC Blood Culture adequate volume  Culture   Final    NO GROWTH 4 DAYS Performed at [facility], [address] [place], [place] [address]    Report Status PENDING  Incomplete  Culture, blood (Routine X 2) w Reflex to ID Panel     Status: Abnormal   Collection Time: 04/25/21 11:22 AM   Specimen: BLOOD  Result Value Ref Range Status   Specimen Description   Final    BLOOD BLOOD LEFT HAND Performed at [facility], [address], [place], [place] [address]    Special Requests   Final    BOTTLES DRAWN AEROBIC AND ANAEROBIC Blood Culture adequate volume Performed at [facility], [address] [place], [place] [address]    Culture  Setup Time   Final    GRAM POSITIVE COCCI AEROBIC BOTTLE ONLY CRITICAL RESULT CALLED TO, READ BACK BY AND VERIFIED WITH: DEVIN MITVHELL 04/26/21 0956 SJL    Culture (A)  Final    STAPHYLOCOCCUS EPIDERMIDIS THE SIGNIFICANCE OF ISOLATING THIS ORGANISM FROM A SINGLE SET OF BLOOD CULTURES WHEN MULTIPLE SETS ARE DRAWN IS UNCERTAIN. PLEASE NOTIFY THE MICROBIOLOGY DEPARTMENT WITHIN ONE WEEK IF SPECIATION AND SENSITIVITIES ARE REQUIRED. Performed at [facility], [place] [address], [place], [place] [address]    Report Status 04/27/2021 FINAL  Final  Blood Culture ID Panel  (Reflexed)     Status: Abnormal   Collection Time: 04/25/21 11:22 AM  Result Value Ref Range Status   Enterococcus faecalis NOT DETECTED NOT DETECTED Final   Enterococcus Faecium NOT DETECTED NOT DETECTED Final   Listeria monocytogenes NOT DETECTED NOT DETECTED Final   Staphylococcus species DETECTED (A) NOT DETECTED Final    Comment: CRITICAL RESULT CALLED TO, READ BACK BY AND VERIFIED WITH: DEVIN MITCHELL 04/26/21 0956 SJL    Staphylococcus aureus (BCID) NOT DETECTED NOT DETECTED Final   Staphylococcus epidermidis DETECTED (A) NOT DETECTED Final    Comment: CRITICAL RESULT CALLED TO, READ BACK BY AND VERIFIED WITH: DEVIN MITCHELL 04/26/21 0956 SJL    Staphylococcus lugdunensis NOT DETECTED NOT DETECTED Final   Streptococcus species NOT DETECTED NOT DETECTED Final   Streptococcus agalactiae NOT DETECTED NOT DETECTED Final   Streptococcus pneumoniae NOT DETECTED NOT DETECTED Final   Streptococcus pyogenes NOT DETECTED NOT DETECTED Final   A.calcoaceticus-baumannii NOT DETECTED NOT DETECTED Final   Bacteroides fragilis NOT DETECTED NOT DETECTED Final   Enterobacterales NOT DETECTED NOT DETECTED Final   Enterobacter cloacae complex NOT DETECTED NOT DETECTED Final   Escherichia coli NOT DETECTED NOT DETECTED Final   Klebsiella aerogenes NOT DETECTED NOT DETECTED Final   Klebsiella oxytoca NOT DETECTED NOT DETECTED Final   Klebsiella pneumoniae NOT DETECTED NOT DETECTED Final   Proteus species NOT DETECTED NOT DETECTED Final   Salmonella species NOT DETECTED NOT DETECTED Final   Serratia marcescens NOT DETECTED NOT DETECTED Final   Haemophilus influenzae NOT DETECTED NOT DETECTED Final   Neisseria meningitidis NOT DETECTED NOT DETECTED Final   Pseudomonas aeruginosa NOT DETECTED NOT DETECTED Final   Stenotrophomonas maltophilia NOT DETECTED NOT DETECTED Final   Candida albicans NOT DETECTED NOT DETECTED Final   Candida auris NOT DETECTED NOT DETECTED Final   Candida glabrata NOT  DETECTED NOT DETECTED Final   Candida krusei NOT DETECTED NOT DETECTED Final   Candida parapsilosis NOT DETECTED NOT DETECTED Final   Candida tropicalis NOT DETECTED NOT DETECTED Final   Cryptococcus neoformans/gattii NOT DETECTED NOT DETECTED Final   Methicillin resistance mecA/C NOT DETECTED NOT DETECTED Final  Comment: Performed at [facility], [address] [place], [place] [address]  Urine Culture     Status: Abnormal   Collection Time: 04/25/21  8:30 PM   Specimen: Urine, Clean Catch  Result Value Ref Range Status   Specimen Description   Final    URINE, CLEAN CATCH Performed at [facility], [address], [place], [place] [address]    Special Requests   Final    NONE Performed at [facility], [address] [place], [place] [address]    Culture >=100,000 COLONIES/mL PROVIDENCIA RETTGERI (A)  Final   Report Status 04/28/2021 FINAL  Final   Organism ID, Bacteria PROVIDENCIA RETTGERI (A)  Final      Susceptibility   Providencia rettgeri - MIC*    AMPICILLIN RESISTANT Resistant     CEFAZOLIN >=64 RESISTANT Resistant     CEFEPIME <=0.12 SENSITIVE Sensitive     CEFTRIAXONE <=0.25 SENSITIVE Sensitive     CIPROFLOXACIN <=0.25 SENSITIVE Sensitive     GENTAMICIN <=1 SENSITIVE Sensitive     IMIPENEM 2 SENSITIVE Sensitive     NITROFURANTOIN 128 RESISTANT Resistant     TRIMETH/SULFA <=20 SENSITIVE Sensitive     AMPICILLIN/SULBACTAM <=2 SENSITIVE Sensitive     PIP/TAZO <=4 SENSITIVE Sensitive     * >=100,000 COLONIES/mL PROVIDENCIA RETTGERI  CULTURE, BLOOD (ROUTINE X 2) w Reflex to ID Panel     Status: None (Preliminary result)   Collection Time: 04/27/21  4:53 AM   Specimen: BLOOD  Result Value Ref Range Status   Specimen Description BLOOD LEFT FA  Final   Special Requests   Final    BOTTLES DRAWN AEROBIC AND ANAEROBIC Blood Culture adequate volume   Culture   Final    NO GROWTH 2 DAYS Performed at [facility], [address], [place], [place] [address]    Report Status PENDING  Incomplete  CULTURE, BLOOD (ROUTINE X 2) w Reflex to ID Panel     Status: None (Preliminary result)   Collection Time: 04/27/21  4:53 AM   Specimen: BLOOD LEFT HAND  Result Value Ref Range Status   Specimen Description BLOOD LEFT HAND  Final   Special Requests   Final    BOTTLES DRAWN AEROBIC AND ANAEROBIC Blood Culture adequate volume   Culture   Final    NO GROWTH 2 DAYS Performed at [facility], [address], [place], [place] [address]    Report Status PENDING  Incomplete         Radiology Studies: No results found.      Scheduled Meds:  enoxaparin (LOVENOX) injection  30 mg Subcutaneous Q24H   feeding supplement  237 mL Oral BID BM   ferrous sulfate  325 mg Oral Q breakfast   folic acid  1 mg Oral Daily   megestrol  400 mg Oral Daily   melatonin  5 mg Oral QHS   midodrine  10 mg Oral TID WC   multivitamin with minerals  1 tablet Oral Daily   polyethylene glycol  17 g Oral Daily   sulfamethoxazole-trimethoprim  1 tablet Oral Q12H   Vitamin D (Ergocalciferol)  50,000 Units Oral Q7 days   Continuous Infusions:    LOS: 30 days        Val Riles, MD [facility]   To contact the attending provider between 7A-7P or the covering provider during after hours 7P-7A, please log into the web site [email or web address] and access using universal [place] password  for that web site. If you do not have the password, please call the hospital operator.  04/29/2021, 12:29 PM

## 2021-04-29 NOTE — Progress Notes (Signed)
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 TLSO recommended from nurosurgery and devliered to room.    PT Comments    Pt is making limited progress and has been very challenging to motivate. Majority of time spent this date educating on benefits and expectations of rehab. Pt goes on to elaborate his concerns of people "in my condition" don't get OOB. He expects he will get stronger without therapy involvement as therapy is pushing too hard. Only agreeable to transfer to [facility] and then left with RN entering room. Will continue to recommend SNF as he is not safe to dc home at this time. No complaints of foot pain this day.   Follow Up Recommendations  SNF     Equipment Recommendations  None recommended by PT    Recommendations for Other Services       Precautions / Restrictions Precautions Precautions: Fall Required Braces or Orthoses: Spinal Brace Spinal Brace: Thoracolumbosacral orthotic Restrictions Weight Bearing Restrictions: No    Mobility  Bed Mobility Overal bed mobility: Needs Assistance Bed Mobility: Supine to Sit     Supine to sit: Min assist     General bed mobility comments: reaches out for therapist arms to trunkal assistance. Once seated at EOB, upright posture noted. Pt appears self limiting    Transfers Overall transfer level: Needs assistance Equipment used: 1 person hand held assist Transfers: Sit to/from Stand Sit to Stand: Min assist Stand pivot transfers: Min assist       General  transfer comment: due to no pain report, off loading boot not worn this session. Pt able to SPT to [facility] for BM. Refused further mobility attempts  Ambulation/Gait             General Gait Details: declined   Marine scientist Rankin (Stroke Patients Only)       Balance Overall balance assessment: Needs assistance Sitting-balance support: Feet supported;Bilateral upper extremity supported Sitting balance-Leahy Scale: Good     Standing balance support: Single extremity supported Standing balance-Leahy Scale: Fair                              Cognition Arousal/Alertness: Awake/alert Behavior During Therapy: WFL for tasks assessed/performed Overall Cognitive Status: Within Functional Limits for tasks assessed                                 General Comments: needs maximal encouragement for participate. Extended time for pt to agree for treatment      Exercises Other Exercises Other Exercises: pt able to transfer to [facility] for BM with min assist for transfer. Also, needs min assist for donning sock/pants while supine in bed and doffing while on BSC.    General Comments        Pertinent Vitals/Pain Pain Assessment: No/denies pain    Home Living  Prior Function            PT Goals (current goals can now be found in the care plan section) Acute Rehab PT Goals Patient Stated Goal: to get stronger PT Goal Formulation: With patient Time For Goal Achievement: 05/11/21 Potential to Achieve Goals: Fair Progress towards PT goals: Progressing toward goals    Frequency    Min 2X/week      PT Plan Current plan remains appropriate    Co-evaluation              AM-PAC PT "6 Clicks" Mobility   Outcome Measure  Help needed turning from your back to your side while in a flat bed without using bedrails?: A Little Help needed moving from lying on your back to sitting  on the side of a flat bed without using bedrails?: A Little Help needed moving to and from a bed to a chair (including a wheelchair)?: A Little Help needed standing up from a chair using your arms (e.g., wheelchair or bedside chair)?: A Little Help needed to walk in hospital room?: A Little Help needed climbing 3-5 steps with a railing? : A Little 6 Click Score: 18    End of Session   Activity Tolerance: Patient tolerated treatment well Patient left:  (on [facility], RN about to enter room) Nurse Communication: Mobility status PT Visit Diagnosis: Unsteadiness on feet (R26.81);Muscle weakness (generalized) (M62.81);Difficulty in walking, not elsewhere classified (R26.2)     Time: 6578-4696 PT Time Calculation (min) (ACUTE ONLY): 21 min  Charges:  $Therapeutic Activity: 8-22 mins                     Greggory Stallion, PT, DPT [phone]    Ryan [name] 04/29/2021, 4:24 PM

## 2021-04-29 NOTE — TOC Progression Note (Signed)
Transition of Care [facility]) - Progression Note    Patient Details  Name: Ryan [name] MRN: [id number] Date of Birth: 10-26-1960  Transition of [facility]) CM/SW [place], RN Phone Number: 04/29/2021, 2:07 PM  Clinical Narrative:  RNCM spoke to [place] caseworker Hulan Fray [phone] (cell) [phone] (office).  She will be in to see patient tomorrow, she had seen him last week and accepted his case.  Ms. Rico Junker will work with her office to explore placement for patient, but [place].  She will contact RNCM with possibilities when she receives information.  RNCM spoke with Winfield Cunas regarding Medicaid application, Ms. Azzie Roup states this application is over 65 days old, but may take up to an additional 30 days before approval.  She will notify RNCM with updates.  TOC will follow patient through discharge.         Expected Discharge Plan and Services                                                 Social Determinants of Health (SDOH) Interventions    Readmission Risk Interventions Readmission Risk Prevention Plan 04/23/2021  Transportation Screening Complete  PCP or Specialist Appt within 3-5 Days Complete  HRI or [place] Complete  Social Work Consult for [place] Planning/Counseling Not Complete  SW consult not completed comments RNCM assigned to patient  Palliative Care Screening Complete  Medication Review Press photographer) Complete  Some recent data might be hidden

## 2021-04-30 LAB — BPAM RBC
Blood Product Expiration Date: 202208122359
ISSUE DATE / TIME: 202207211718
Unit Type and Rh: 5100

## 2021-04-30 LAB — BASIC METABOLIC PANEL
Anion gap: 7 (ref 5–15)
BUN: 11 mg/dL (ref 6–20)
CO2: 18 mmol/L — ABNORMAL LOW (ref 22–32)
Calcium: 8 mg/dL — ABNORMAL LOW (ref 8.9–10.3)
Chloride: 104 mmol/L (ref 98–111)
Creatinine, Ser: 0.43 mg/dL — ABNORMAL LOW (ref 0.61–1.24)
GFR, Estimated: >60 mL/min (ref >60)
Glucose, Bld: 96 mg/dL (ref 70–99)
Potassium: 4.4 mmol/L (ref 3.5–5.1)
Sodium: 129 mmol/L — ABNORMAL LOW (ref 135–145)

## 2021-04-30 LAB — TYPE AND SCREEN
ABO/RH(D): O POS
Antibody Screen: NEGATIVE
Unit division: 0

## 2021-04-30 LAB — PHOSPHORUS: Phosphorus: 3.4 mg/dL (ref 2.5–4.6)

## 2021-04-30 LAB — CBC
HCT: 25.7 % — ABNORMAL LOW (ref 39.0–52.0)
Hemoglobin: 8.4 g/dL — ABNORMAL LOW (ref 13.0–17.0)
MCH: 25.7 pg — ABNORMAL LOW (ref 26.0–34.0)
MCHC: 32.7 g/dL (ref 30.0–36.0)
MCV: 78.6 fL — ABNORMAL LOW (ref 80.0–100.0)
Platelets: 211 10*3/uL (ref 150–400)
RBC: 3.27 MIL/uL — ABNORMAL LOW (ref 4.22–5.81)
RDW: 16.9 % — ABNORMAL HIGH (ref 11.5–15.5)
WBC: 5.6 10*3/uL (ref 4.0–10.5)
nRBC: 0 % (ref 0.0–0.2)

## 2021-04-30 LAB — CULTURE, BLOOD (ROUTINE X 2)
Culture: NO GROWTH
Special Requests: ADEQUATE

## 2021-04-30 LAB — MAGNESIUM: Magnesium: 1.7 mg/dL (ref 1.7–2.4)

## 2021-04-30 MED ORDER — SODIUM CHLORIDE 1 G PO TABS
2.0000 g | ORAL_TABLET | Freq: Two times a day (BID) | ORAL | Status: DC
  Administered 2021-05-01: 10:00:00 2 g via ORAL
  Filled 2021-04-30: qty 2

## 2021-04-30 MED ORDER — SODIUM BICARBONATE 650 MG PO TABS
650.0000 mg | ORAL_TABLET | Freq: Three times a day (TID) | ORAL | Status: AC
  Administered 2021-05-01: 650 mg via ORAL
  Filled 2021-04-30 (×6): qty 1

## 2021-04-30 NOTE — TOC Progression Note (Signed)
Transition of Care [facility]) - Progression Note    Patient Details  Name: Ryan [name] MRN: [id number] Date of Birth: [birth year]  Transition of Care [facility]) CM/SW [place], RN Phone Number: 04/30/2021, 2:42 PM  Clinical Narrative:   Medicaid pending, APS searching for placement., TOC to follow to discharge.         Expected Discharge Plan and Services                                                 Social Determinants of Health (SDOH) Interventions    Readmission Risk Interventions Readmission Risk Prevention Plan 04/23/2021  Transportation Screening Complete  PCP or Specialist Appt within 3-5 Days Complete  HRI or [place] Complete  Social Work Consult for [place] Planning/Counseling Not Complete  SW consult not completed comments RNCM assigned to patient  Palliative Care Screening Complete  Medication Review Press photographer) Complete  Some recent data might be hidden

## 2021-04-30 NOTE — Plan of Care (Signed)
End of shift summary:  Completed 1 unit of PRBC without any adverse reactions. VSS on room air. Pain managed with prn medications. Denies n/v. Urine output adequate. Cough syrup as needed. Consumed 50% of two dietary trays. Remained free from falls or injury. Call bell within reach and able to use.    Problem: Coping: Goal: Level of anxiety will decrease Outcome: Progressing   Problem: Pain Managment: Goal: General experience of comfort will improve Outcome: Progressing   Problem: Skin Integrity: Goal: Risk for impaired skin integrity will decrease Outcome: Progressing

## 2021-04-30 NOTE — Progress Notes (Signed)
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 TLSO recommended from nurosurgery and devliered to room.    PT Comments    Pt seen for PT tx with pt requiring max encouragement to participate; pt declines OOB mobility but agreeable to bed level exercises. Pt performs exercises with AROM and AAROM with PT providing instructional cuing for technique. Pt reports increasing c/o RLE pain with exercises, rest provided.     Follow Up Recommendations  SNF     Equipment Recommendations  None recommended by PT    Recommendations for Other Services       Precautions / Restrictions Precautions Precautions: Fall Required Braces or Orthoses: Spinal Brace Spinal Brace: Thoracolumbosacral orthotic Restrictions Weight Bearing Restrictions: No Other Position/Activity Restrictions: LLE post op shoe    Mobility  Bed Mobility                    Transfers                    Ambulation/Gait                 Stairs             Wheelchair Mobility    Modified Rankin (Stroke Patients Only)       Balance                                            Cognition Arousal/Alertness: Awake/alert Behavior During Therapy: WFL for tasks assessed/performed;Flat affect Overall Cognitive Status: Within Functional Limits for tasks assessed                                 General Comments: declines OOB activity, requires max encouragement to participate      Exercises  General Exercises - Lower Extremity Ankle Circles/Pumps: AROM;Both;10 reps;Supine Heel Slides: AROM;Strengthening;Both;10 reps;Supine Hip ABduction/ADduction: Strengthening;AAROM;Both;10 reps;Supine Straight Leg Raises: AAROM;Strengthening;Both;10 reps;Supine    General Comments        Pertinent Vitals/Pain Pain Assessment: Faces Faces Pain Scale: Hurts little more Pain Location: "behind R knee cap", R thigh Pain Descriptors / Indicators: Discomfort;Grimacing Pain Intervention(s): Limited activity within patient's tolerance;Monitored during session    Home Living                      Prior Function            PT Goals (current goals can now be found in the care plan section) Acute Rehab PT Goals Patient Stated Goal: to get stronger PT Goal Formulation: With patient Time For Goal Achievement: 05/11/21 Potential to Achieve Goals: Fair Progress towards PT goals: Progressing toward goals    Frequency    Min 2X/week      PT Plan Current plan remains appropriate    Co-evaluation              AM-PAC PT "6 Clicks" Mobility  Outcome Measure  Help needed turning from your back to your side while in a flat bed without using bedrails?: A Little Help needed moving from lying on your back to sitting on the side of a flat bed without using bedrails?: A Little Help needed moving to and from a bed to a chair (including a wheelchair)?: A Little Help needed standing up from a chair using your arms (e.g., wheelchair or bedside chair)?: A Little Help needed to walk in hospital room?: A Little Help needed climbing 3-5 steps with a railing? : A Little 6 Click Score: 18    End of Session   Activity Tolerance: Patient tolerated treatment well Patient left: in bed;with bed alarm set;with call bell/phone within reach   PT Visit Diagnosis: Unsteadiness on feet (R26.81);Muscle weakness (generalized) (M62.81);Difficulty in walking, not elsewhere classified (R26.2)      Time: [id number] PT Time Calculation (min) (ACUTE ONLY): 11 min  Charges:  $Therapeutic Exercise: 8-22 mins                     [name], PT, DPT 04/30/21, 2:27 PM    [name] 04/30/2021, 2:26 PM

## 2021-04-30 NOTE — Progress Notes (Addendum)
PROGRESS NOTE    Ryan [name]  [id number] DOB: 11/11/1960 DOA: 03/30/2021 PCP: Pcp, No   Chief Complaint  Patient presents with   Pain Management    Brief Narrative: Ryan [name] is a 60 y.o. male with medical history significant for history of polysubstance abuse including THC, cocaine, alcohol, remote history of tobacco use, history of right proximal femur inter trochanteric fracture status post right IM nail intertrochanteric fixation in 04/02/2021, does not follow-up with a PCP, presents to the emergency department for chief concerns of right hip pain.  Pain is chronic for several months, denies any trauma, difficulty ambulation due to pain. He also endorses an unintentional 35 pound weight loss in the last year, denies bright red blood per rectum and melena. patient was found to have L5 compression fracture, L3/4 severe stenosis.  She was also found to have widespread bony metastasis  from prostate cancer. Patient is a followed by oncology.  He needed SNF placement, not able to find accepting facility. He is intermittently refusing some meds.   He Was febrile over the weekend and his blood cultures show 1/4 bottles GPC, possibly contaminant. He has frequent urination and occasional dry cough. CXR unremarkable abut UA is positive for nitrites, rare bacteria. Urine cultures and repeat Blood cultures ordered, if they are negative will discontinue antibiotics in the next 24 to 48 hours.   Pt seen and examined at bedside, no new complaints.    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 Neurosurgery consulted recommended not a surgical candidate Pain control. Recommend outpatient follow up with oncology for treatment.  Suspect he will remain in the hospital the end of the month   Severe symptomatic anemia with folic acid deficiency 123456 Hb 7.0, transfused 1 unit of PRBC Monitor H&H Transfuse to keep hemoglobin greater than 7.   Hypomagnesemia, mag repleted.  Monitor and replete as needed.  Hypotonic hyponatremia: Serum osmolarity 264, continue fluid restriction 1.5 L/day 7/21 Ns 130  asymptomatic 7/22 started Nacl 2 g po BID x 2 days Continue to monitor  Hypokalemia, resolved non anion gap metabolic acidosis, [id number] started po bicarb 650 TID x 2 days    Cancer cachexia with severe protein calorie malnutrition Dietary on board recommend encouraging oral intake.  Left heel pain:  X rays of the foot ordered to evaluate for pathological fractures.  Negative.  Increased oxycodone 1 to 2 pill every 6 hours prn.   Hypotension:  Asymptomatic, keep MAP greater than 65, on Midodrine 10 mg po TID Stable on Midodrine   Fever: Blood cultures ordered, get CXR and UA.  Was febrile over the weekend and his blood cultures show 1/4 bottles GPC staph epidermidis possibly contaminant. He has frequent urination and occasional dry cough. CXR unremarkable abut UA is positive for nitrites, rare bacteria. Urine cultures and repeat Blood cultures ordered, if they are negative will discontinue antibiotics in the next 24 to 48 hours. Urine cultures showing providentia >  1,00,000, sensitive to ceftriaxone and Bactrim.  S/p ceftriaxone x2 days, changed to Bactrim for 5 days, total 7 days Abx     DVT prophylaxis: lovenox.  Code Status: full code.  Family Communication: none at bedside.  Disposition:   Status is:  Inpatient  Remains inpatient appropriate because:Unsafe d/c plan  Dispo: The patient is from: Home              Anticipated d/c is to: SNF              Patient currently is medically stable to d/c.   Difficult to place patient Yes       Consultants:  Oncology.   Procedures: none.   Antimicrobials: ceftriaxone x 2 doses                             Bactrim for 5 days  Subjective: No overnight issues, c/o generalized body soreness and still feels little bit weak, no any other active issues.      Objective: Vitals:   04/29/21 2252 04/30/21 0641 04/30/21 0717 04/30/21 1106  BP: 125/90 118/75 102/61 98/64  Pulse: 85 (!) 103 100 93  Resp: '16 20 15 16  '$ Temp: 98.3 F (36.8 C) 99.5 F (37.5 C) 99.4 F (37.4 C) 98.3 F (36.8 C)  TempSrc: Oral Oral    SpO2: 97% 98% 97% 100%  Weight:      Height:        Intake/Output Summary (Last 24 hours) at 04/30/2021 1415 Last data filed at 04/30/2021 [id number] Gross per 24 hour  Intake 884 ml  Output 725 ml  Net 159 ml   Filed Weights   04/01/21 1232 04/08/21 0800 04/14/21 1246  Weight: 54.7 kg 41.6 kg 51.5 kg    Examination:  General exam: Cachetic, ill appearing gentleman, not in distress.  Respiratory system: Air entry fair, no wheezing heard, on RA.  Cardiovascular system: RRR, no JVD, no pedal edema.  Gastrointestinal system: Abdomen is soft, NT BS+ Central nervous system: CN grossly intact, right lower extremity weakness, no any other focal deficits  Extremities:  no pedal edema, muscle wasting seen on extremities.  Skin: No rashes seen.  Psychiatry: Mood  is appropriate.     Data Reviewed: I have personally reviewed following labs and imaging studies  CBC: Recent Labs  Lab 04/24/21 0551 04/27/21 0453 04/29/21 0600 04/30/21 0424  WBC 6.7 8.4 6.6 5.6  HGB 8.0* 7.5* 7.0* 8.4*  HCT 24.3* 22.9* 21.2* 25.7*  MCV 80.7 80.9 80.0 78.6*  PLT 213 192 193 [id number]    Basic Metabolic Panel: Recent Labs  Lab 04/24/21 0551  04/29/21 0600 04/30/21 0424  NA 134* 130* 129*  K 4.3 4.3 4.4  CL 106 103 104  CO2 21* 19* 18*  GLUCOSE 119* 97 96  BUN '14 12 11  '$ CREATININE 0.46* 0.50* 0.43*  CALCIUM 8.0* 8.1* 8.0*  MG  --  1.6* 1.7  PHOS  --  3.4 3.4    GFR: Estimated Creatinine Clearance: 72.4 mL/min (A) (by C-G formula based on SCr of 0.43 mg/dL (L)).  Liver Function Tests: No results for input(s): AST, ALT, ALKPHOS, BILITOT, PROT, ALBUMIN in the last 168 hours.  CBG: No results for input(s): GLUCAP in the last 168 hours.   Recent Results (from the past 240 hour(s))  Culture, blood (Routine X 2) w Reflex to ID Panel     Status: None   Collection Time: 04/25/21  11:21 AM   Specimen: BLOOD  Result Value Ref Range Status   Specimen Description BLOOD BLOOD RIGHT HAND  Final   Special Requests   Final    BOTTLES DRAWN AEROBIC AND ANAEROBIC Blood Culture adequate volume   Culture   Final    NO GROWTH 5 DAYS Performed at [facility], [address], [place], [place] [address]    Report Status 04/30/2021 FINAL  Final  Culture, blood (Routine X 2) w Reflex to ID Panel     Status: Abnormal   Collection Time: 04/25/21 11:22 AM   Specimen: BLOOD  Result Value Ref Range Status   Specimen Description   Final    BLOOD BLOOD LEFT HAND Performed at [facility], [address], [place], [place] [address]    Special Requests   Final    BOTTLES DRAWN AEROBIC AND ANAEROBIC Blood Culture adequate volume Performed at [facility], [address] [place], [place] [address]    Culture  Setup Time   Final    GRAM POSITIVE COCCI AEROBIC BOTTLE ONLY CRITICAL RESULT CALLED TO, READ BACK BY AND VERIFIED WITH: DEVIN MITVHELL 04/26/21 0956 [facility]    Culture (A)  Final    STAPHYLOCOCCUS EPIDERMIDIS THE SIGNIFICANCE OF ISOLATING THIS ORGANISM FROM A SINGLE SET OF BLOOD CULTURES WHEN MULTIPLE SETS ARE DRAWN IS UNCERTAIN. PLEASE NOTIFY THE MICROBIOLOGY DEPARTMENT WITHIN ONE WEEK IF SPECIATION  AND SENSITIVITIES ARE REQUIRED. Performed at [facility], [place] [address], [place], [place] [address]    Report Status 04/27/2021 FINAL  Final  Blood Culture ID Panel (Reflexed)     Status: Abnormal   Collection Time: 04/25/21 11:22 AM  Result Value Ref Range Status   Enterococcus faecalis NOT DETECTED NOT DETECTED Final   Enterococcus Faecium NOT DETECTED NOT DETECTED Final   Listeria monocytogenes NOT DETECTED NOT DETECTED Final   Staphylococcus species DETECTED (A) NOT DETECTED Final    Comment: CRITICAL RESULT CALLED TO, READ BACK BY AND VERIFIED WITH: DEVIN MITCHELL 04/26/21 0956 SJL    Staphylococcus aureus (BCID) NOT DETECTED NOT DETECTED Final   Staphylococcus epidermidis DETECTED (A) NOT DETECTED Final    Comment: CRITICAL RESULT CALLED TO, READ BACK BY AND VERIFIED WITH: DEVIN MITCHELL 04/26/21 0956 SJL    Staphylococcus lugdunensis NOT DETECTED NOT DETECTED Final   Streptococcus species NOT DETECTED NOT DETECTED Final   Streptococcus agalactiae NOT DETECTED NOT DETECTED Final   Streptococcus pneumoniae NOT DETECTED NOT DETECTED Final   Streptococcus pyogenes NOT DETECTED NOT DETECTED Final   A.calcoaceticus-baumannii NOT DETECTED NOT DETECTED Final   Bacteroides fragilis NOT DETECTED NOT DETECTED Final   Enterobacterales NOT DETECTED NOT DETECTED Final   Enterobacter cloacae complex NOT DETECTED NOT DETECTED Final   Escherichia coli NOT DETECTED NOT DETECTED Final   Klebsiella aerogenes NOT DETECTED NOT DETECTED Final   Klebsiella oxytoca NOT DETECTED NOT DETECTED Final   Klebsiella pneumoniae NOT DETECTED NOT DETECTED Final   Proteus species NOT DETECTED NOT DETECTED Final   Salmonella species NOT DETECTED NOT DETECTED Final   Serratia marcescens NOT DETECTED NOT DETECTED Final   Haemophilus influenzae NOT DETECTED NOT DETECTED Final   Neisseria meningitidis NOT DETECTED NOT DETECTED Final   Pseudomonas aeruginosa NOT DETECTED NOT DETECTED Final    Stenotrophomonas maltophilia NOT DETECTED NOT DETECTED Final   Candida albicans NOT DETECTED NOT DETECTED Final   Candida auris NOT DETECTED NOT DETECTED Final   Candida glabrata NOT DETECTED NOT DETECTED Final   Candida  krusei NOT DETECTED NOT DETECTED Final   Candida parapsilosis NOT DETECTED NOT DETECTED Final   Candida tropicalis NOT DETECTED NOT DETECTED Final   Cryptococcus neoformans/gattii NOT DETECTED NOT DETECTED Final   Methicillin resistance mecA/C NOT DETECTED NOT DETECTED Final    Comment: Performed at [facility], [address] [place], [place] [address]  Urine Culture     Status: Abnormal   Collection Time: 04/25/21  8:30 PM   Specimen: Urine, Clean Catch  Result Value Ref Range Status   Specimen Description   Final    URINE, CLEAN CATCH Performed at [facility], [address] [place], [place] [address]    Special Requests   Final    NONE Performed at [facility], [address] [place], [place] [address]    Culture >=100,000 COLONIES/mL PROVIDENCIA RETTGERI (A)  Final   Report Status 04/28/2021 FINAL  Final   Organism ID, Bacteria PROVIDENCIA RETTGERI (A)  Final      Susceptibility   Providencia rettgeri - MIC*    AMPICILLIN RESISTANT Resistant     CEFAZOLIN >=64 RESISTANT Resistant     CEFEPIME <=0.12 SENSITIVE Sensitive     CEFTRIAXONE <=0.25 SENSITIVE Sensitive     CIPROFLOXACIN <=0.25 SENSITIVE Sensitive     GENTAMICIN <=1 SENSITIVE Sensitive     IMIPENEM 2 SENSITIVE Sensitive     NITROFURANTOIN 128 RESISTANT Resistant     TRIMETH/SULFA <=20 SENSITIVE Sensitive     AMPICILLIN/SULBACTAM <=2 SENSITIVE Sensitive     PIP/TAZO <=4 SENSITIVE Sensitive     * >=100,000 COLONIES/mL PROVIDENCIA RETTGERI  CULTURE, BLOOD (ROUTINE X 2) w Reflex to ID Panel     Status: None (Preliminary result)   Collection Time: 04/27/21  4:53 AM   Specimen: BLOOD  Result Value Ref Range Status   Specimen Description BLOOD LEFT FA  Final    Special Requests   Final    BOTTLES DRAWN AEROBIC AND ANAEROBIC Blood Culture adequate volume   Culture   Final    NO GROWTH 3 DAYS Performed at [facility], [address] [place], [place] [address]    Report Status PENDING  Incomplete  CULTURE, BLOOD (ROUTINE X 2) w Reflex to ID Panel     Status: None (Preliminary result)   Collection Time: 04/27/21  4:53 AM   Specimen: BLOOD LEFT HAND  Result Value Ref Range Status   Specimen Description BLOOD LEFT HAND  Final   Special Requests   Final    BOTTLES DRAWN AEROBIC AND ANAEROBIC Blood Culture adequate volume   Culture   Final    NO GROWTH 3 DAYS Performed at [facility], [address], [place],  [address]    Report Status PENDING  Incomplete         Radiology Studies: No results found.      Scheduled Meds:  enoxaparin (LOVENOX) injection  30 mg Subcutaneous Q24H   feeding supplement  237 mL Oral BID BM   ferrous sulfate  325 mg Oral Q breakfast   folic acid  1 mg Oral Daily   megestrol  400 mg Oral Daily   melatonin  5 mg Oral QHS   midodrine  10 mg Oral TID WC   multivitamin with minerals  1 tablet Oral Daily   polyethylene glycol  17 g Oral Daily   sodium bicarbonate  650 mg Oral TID   sodium chloride  2 g Oral BID WC   sulfamethoxazole-trimethoprim  1 tablet Oral  Q12H   Vitamin D (Ergocalciferol)  50,000 Units Oral Q7 days   Continuous Infusions:    LOS: 31 days        Val Riles, MD [facility]   To contact the attending provider between 7A-7P or the covering provider during after hours 7P-7A, please log into the web site [email or web address] and access using universal [place] password for that web site. If you do not have the password, please call the hospital operator.  04/30/2021, 2:15 PM

## 2021-05-01 LAB — CBC
HCT: 25.5 % — ABNORMAL LOW (ref 39.0–52.0)
Hemoglobin: 8.8 g/dL — ABNORMAL LOW (ref 13.0–17.0)
MCH: 27.8 pg (ref 26.0–34.0)
MCHC: 34.5 g/dL (ref 30.0–36.0)
MCV: 80.7 fL (ref 80.0–100.0)
Platelets: 182 10*3/uL (ref 150–400)
RBC: 3.16 MIL/uL — ABNORMAL LOW (ref 4.22–5.81)
RDW: 16.8 % — ABNORMAL HIGH (ref 11.5–15.5)
WBC: 7.3 10*3/uL (ref 4.0–10.5)
nRBC: 0 % (ref 0.0–0.2)

## 2021-05-01 LAB — PHOSPHORUS: Phosphorus: 3.4 mg/dL (ref 2.5–4.6)

## 2021-05-01 LAB — BASIC METABOLIC PANEL
Anion gap: 6 (ref 5–15)
BUN: 10 mg/dL (ref 6–20)
CO2: 20 mmol/L — ABNORMAL LOW (ref 22–32)
Calcium: 8.2 mg/dL — ABNORMAL LOW (ref 8.9–10.3)
Chloride: 103 mmol/L (ref 98–111)
Creatinine, Ser: 0.41 mg/dL — ABNORMAL LOW (ref 0.61–1.24)
GFR, Estimated: >60 mL/min (ref >60)
Glucose, Bld: 88 mg/dL (ref 70–99)
Potassium: 4.6 mmol/L (ref 3.5–5.1)
Sodium: 129 mmol/L — ABNORMAL LOW (ref 135–145)

## 2021-05-01 LAB — MAGNESIUM: Magnesium: 1.7 mg/dL (ref 1.7–2.4)

## 2021-05-01 MED ORDER — SODIUM CHLORIDE 1 G PO TABS: 2.0000 g | ORAL_TABLET | Freq: Two times a day (BID) | ORAL | Status: DC

## 2021-05-01 MED ORDER — ENOXAPARIN SODIUM 40 MG/0.4ML IJ SOSY
40.0000 mg | PREFILLED_SYRINGE | INTRAMUSCULAR | Status: DC
  Filled 2021-05-01 (×12): qty 0.4

## 2021-05-01 NOTE — Progress Notes (Signed)
PROGRESS NOTE    Ryan [name]  [id number] DOB: 05/03/1961 DOA: 03/30/2021 PCP: Pcp, No   Chief Complaint  Patient presents with   Pain Management    Brief Narrative: Ryan [name] is a 60 y.o. male with medical history significant for history of polysubstance abuse including THC, cocaine, alcohol, remote history of tobacco use, history of right proximal femur inter trochanteric fracture status post right IM nail intertrochanteric fixation in 04/02/2021, does not follow-up with a PCP, presents to the emergency department for chief concerns of right hip pain.  Pain is chronic for several months, denies any trauma, difficulty ambulation due to pain. He also endorses an unintentional 35 pound weight loss in the last year, denies bright red blood per rectum and melena. patient was found to have L5 compression fracture, L3/4 severe stenosis.  She was also found to have widespread bony metastasis  from prostate cancer. Patient is a followed by oncology.  He needed SNF placement, not able to find accepting facility. He is intermittently refusing some meds.   He Was febrile over the weekend and his blood cultures show 1/4 bottles GPC, possibly contaminant. He has frequent urination and occasional dry cough. CXR unremarkable abut UA is positive for nitrites, rare bacteria. Urine cultures and repeat Blood cultures ordered, if they are negative will discontinue antibiotics in the next 24 to 48 hours.   Pt seen and examined at bedside, no new complaints.    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 Neurosurgery consulted recommended not a surgical candidate Pain control. Recommend outpatient follow up with oncology for treatment.  Suspect he will remain in the hospital the end of the month   Severe symptomatic anemia with folic acid deficiency 123456 Hb 7.0, transfused 1 unit of PRBC Monitor H&H Transfuse to keep hemoglobin greater than 7.   Hypomagnesemia, mag repleted.  Monitor and replete as needed.  Hypotonic hyponatremia: Serum osmolarity 264, continue fluid restriction 1.5 L/day 7/23 Ns 129  asymptomatic 7/22 started Nacl 2 g po BID x 3 days Continue to monitor  Hypokalemia, resolved non anion gap metabolic acidosis, [id number] started po bicarb 650 TID x 2 days    Cancer cachexia with severe protein calorie malnutrition Dietary on board recommend encouraging oral intake.  Left heel pain:  X rays of the foot ordered to evaluate for pathological fractures.  Negative.  Increased oxycodone 1 to 2 pill every 6 hours prn.   Hypotension:  Asymptomatic, keep MAP greater than 65, on Midodrine 10 mg po TID Stable on Midodrine   Fever: Blood cultures ordered, get CXR and UA.  Was febrile over the weekend and his blood cultures show 1/4 bottles GPC staph epidermidis possibly contaminant. He has frequent urination and occasional dry cough. CXR unremarkable abut UA is positive for nitrites, rare bacteria. Urine cultures and repeat Blood cultures ordered, if they are negative will discontinue antibiotics in the next 24 to 48 hours. Urine cultures showing providentia >  1,00,000, sensitive to ceftriaxone and Bactrim.  S/p ceftriaxone x2 days, changed to Bactrim for 5 days, total 7 days Abx     DVT prophylaxis: lovenox.  Code Status: full code.  Family Communication: none at bedside.  Disposition:   Status is:  Inpatient  Remains inpatient appropriate because:Unsafe d/c plan  Dispo: The patient is from: Home              Anticipated d/c is to: SNF              Patient currently is medically stable to d/c.   Difficult to place patient Yes       Consultants:  Oncology.   Procedures: none.   Antimicrobials: ceftriaxone x 2 doses                             Bactrim for 5 days  Subjective: No overnight issues, patient seems sleepy and lethargic may be that he just woke up in the morning.  As per RN no any significant reports overnight.  We will continue to monitor. Patient denied any active issues.   Objective: Vitals:   04/30/21 2010 05/01/21 0031 05/01/21 0433 05/01/21 0934  BP: 115/61 112/68 109/74 109/69  Pulse: (!) 109 (!) 111 (!) 102 (!) 108  Resp: '18 16 18 20  '$ Temp: 98.2 F (36.8 C) 98.9 F (37.2 C) 98.6 F (37 C) 98.9 F (37.2 C)  TempSrc:   Oral Oral  SpO2: 98% 99% 98% 98%  Weight:      Height:        Intake/Output Summary (Last 24 hours) at 05/01/2021 1214 Last data filed at 05/01/2021 0200 Gross per 24 hour  Intake 120 ml  Output 200 ml  Net -80 ml   Filed Weights   04/01/21 1232 04/08/21 0800 04/14/21 1246  Weight: 54.7 kg 41.6 kg 51.5 kg    Examination:  General exam: Cachetic, ill appearing gentleman, not in distress.  Respiratory system: Air entry fair, no wheezing heard, on RA.  Cardiovascular system: RRR, no JVD, no pedal edema.  Gastrointestinal system: Abdomen is soft, NT BS+ Central nervous system: CN grossly intact, right lower extremity weakness, no any other focal deficits  Extremities:  no pedal edema, muscle wasting seen on extremities.  Skin: No rashes seen.  Psychiatry: Mood  is appropriate.     Data Reviewed: I have personally reviewed following labs and imaging studies  CBC: Recent Labs  Lab 04/27/21 0453 04/29/21 0600 04/30/21 0424 05/01/21 0537  WBC 8.4 6.6 5.6 7.3  HGB 7.5* 7.0* 8.4* 8.8*  HCT 22.9* 21.2* 25.7* 25.5*   MCV 80.9 80.0 78.6* 80.7  PLT 192 193 211 [id number]    Basic Metabolic Panel: Recent Labs  Lab 04/29/21 0600 04/30/21 0424 05/01/21 0537  NA 130* 129* 129*  K 4.3 4.4 4.6  CL 103 104 103  CO2 19* 18* 20*  GLUCOSE 97 96 88  BUN '12 11 10  '$ CREATININE 0.50* 0.43* 0.41*  CALCIUM 8.1* 8.0* 8.2*  MG 1.6* 1.7 1.7  PHOS 3.4 3.4 3.4    GFR: Estimated Creatinine Clearance: 72.4 mL/min (A) (by C-G formula based on SCr of 0.41 mg/dL (L)).  Liver Function Tests: No results for input(s): AST, ALT, ALKPHOS, BILITOT, PROT, ALBUMIN in the last 168 hours.  CBG: No results for input(s): GLUCAP in the last 168 hours.   Recent Results (from the past 240 hour(s))  Culture, blood (Routine X 2)  w Reflex to ID Panel     Status: None   Collection Time: 04/25/21 11:21 AM   Specimen: BLOOD  Result Value Ref Range Status   Specimen Description BLOOD BLOOD RIGHT HAND  Final   Special Requests   Final    BOTTLES DRAWN AEROBIC AND ANAEROBIC Blood Culture adequate volume   Culture   Final    NO GROWTH 5 DAYS Performed at [facility], [address], [place], [place] [address]    Report Status 04/30/2021 FINAL  Final  Culture, blood (Routine X 2) w Reflex to ID Panel     Status: Abnormal   Collection Time: 04/25/21 11:22 AM   Specimen: BLOOD  Result Value Ref Range Status   Specimen Description   Final    BLOOD BLOOD LEFT HAND Performed at [facility], [address], [place], [place] [address]    Special Requests   Final    BOTTLES DRAWN AEROBIC AND ANAEROBIC Blood Culture adequate volume Performed at [facility], [address] [place], [place] [address]    Culture  Setup Time   Final    GRAM POSITIVE COCCI AEROBIC BOTTLE ONLY CRITICAL RESULT CALLED TO, READ BACK BY AND VERIFIED WITH: DEVIN MITVHELL 04/26/21 0956 SJL    Culture (A)  Final    STAPHYLOCOCCUS EPIDERMIDIS THE SIGNIFICANCE OF ISOLATING THIS ORGANISM FROM A SINGLE SET OF BLOOD CULTURES WHEN  MULTIPLE SETS ARE DRAWN IS UNCERTAIN. PLEASE NOTIFY THE MICROBIOLOGY DEPARTMENT WITHIN ONE WEEK IF SPECIATION AND SENSITIVITIES ARE REQUIRED. Performed at [facility], [place] [address] [place], [place] [address]    Report Status 04/27/2021 FINAL  Final  Blood Culture ID Panel (Reflexed)     Status: Abnormal   Collection Time: 04/25/21 11:22 AM  Result Value Ref Range Status   Enterococcus faecalis NOT DETECTED NOT DETECTED Final   Enterococcus Faecium NOT DETECTED NOT DETECTED Final   Listeria monocytogenes NOT DETECTED NOT DETECTED Final   Staphylococcus species DETECTED (A) NOT DETECTED Final    Comment: CRITICAL RESULT CALLED TO, READ BACK BY AND VERIFIED WITH: DEVIN MITCHELL 04/26/21 0956 SJL    Staphylococcus aureus (BCID) NOT DETECTED NOT DETECTED Final   Staphylococcus epidermidis DETECTED (A) NOT DETECTED Final    Comment: CRITICAL RESULT CALLED TO, READ BACK BY AND VERIFIED WITH: DEVIN MITCHELL 04/26/21 0956 SJL    Staphylococcus lugdunensis NOT DETECTED NOT DETECTED Final   Streptococcus species NOT DETECTED NOT DETECTED Final   Streptococcus agalactiae NOT DETECTED NOT DETECTED Final   Streptococcus pneumoniae NOT DETECTED NOT DETECTED Final   Streptococcus pyogenes NOT DETECTED NOT DETECTED Final   A.calcoaceticus-baumannii NOT DETECTED NOT DETECTED Final   Bacteroides fragilis NOT DETECTED NOT DETECTED Final   Enterobacterales NOT DETECTED NOT DETECTED Final   Enterobacter cloacae complex NOT DETECTED NOT DETECTED Final   Escherichia coli NOT DETECTED NOT DETECTED Final   Klebsiella aerogenes NOT DETECTED NOT DETECTED Final   Klebsiella oxytoca NOT DETECTED NOT DETECTED Final   Klebsiella pneumoniae NOT DETECTED NOT DETECTED Final   Proteus species NOT DETECTED NOT DETECTED Final   Salmonella species NOT DETECTED NOT DETECTED Final   Serratia marcescens NOT DETECTED NOT DETECTED Final   Haemophilus influenzae NOT DETECTED NOT DETECTED Final   Neisseria  meningitidis NOT DETECTED NOT DETECTED Final   Pseudomonas aeruginosa NOT DETECTED NOT DETECTED Final   Stenotrophomonas maltophilia NOT DETECTED NOT DETECTED Final   Candida albicans NOT DETECTED NOT DETECTED Final   Candida auris NOT  DETECTED NOT DETECTED Final   Candida glabrata NOT DETECTED NOT DETECTED Final   Candida krusei NOT DETECTED NOT DETECTED Final   Candida parapsilosis NOT DETECTED NOT DETECTED Final   Candida tropicalis NOT DETECTED NOT DETECTED Final   Cryptococcus neoformans/gattii NOT DETECTED NOT DETECTED Final   Methicillin resistance mecA/C NOT DETECTED NOT DETECTED Final    Comment: Performed at [facility], [address], [place], [place] [address]  Urine Culture     Status: Abnormal   Collection Time: 04/25/21  8:30 PM   Specimen: Urine, Clean Catch  Result Value Ref Range Status   Specimen Description   Final    URINE, CLEAN CATCH Performed at [facility], [address], [place], [place] [address]    Special Requests   Final    NONE Performed at [facility], [address] [place], [place] [address]    Culture >=100,000 COLONIES/mL PROVIDENCIA RETTGERI (A)  Final   Report Status 04/28/2021 FINAL  Final   Organism ID, Bacteria PROVIDENCIA RETTGERI (A)  Final      Susceptibility   Providencia rettgeri - MIC*    AMPICILLIN RESISTANT Resistant     CEFAZOLIN >=64 RESISTANT Resistant     CEFEPIME <=0.12 SENSITIVE Sensitive     CEFTRIAXONE <=0.25 SENSITIVE Sensitive     CIPROFLOXACIN <=0.25 SENSITIVE Sensitive     GENTAMICIN <=1 SENSITIVE Sensitive     IMIPENEM 2 SENSITIVE Sensitive     NITROFURANTOIN 128 RESISTANT Resistant     TRIMETH/SULFA <=20 SENSITIVE Sensitive     AMPICILLIN/SULBACTAM <=2 SENSITIVE Sensitive     PIP/TAZO <=4 SENSITIVE Sensitive     * >=100,000 COLONIES/mL PROVIDENCIA RETTGERI  CULTURE, BLOOD (ROUTINE X 2) w Reflex to ID Panel     Status: None (Preliminary result)   Collection Time: 04/27/21  4:53  AM   Specimen: BLOOD  Result Value Ref Range Status   Specimen Description BLOOD LEFT FA  Final   Special Requests   Final    BOTTLES DRAWN AEROBIC AND ANAEROBIC Blood Culture adequate volume   Culture   Final    NO GROWTH 4 DAYS Performed at [facility], [address] [place], [place] [address]    Report Status PENDING  Incomplete  CULTURE, BLOOD (ROUTINE X 2) w Reflex to ID Panel     Status: None (Preliminary result)   Collection Time: 04/27/21  4:53 AM   Specimen: BLOOD LEFT HAND  Result Value Ref Range Status   Specimen Description BLOOD LEFT HAND  Final   Special Requests   Final    BOTTLES DRAWN AEROBIC AND ANAEROBIC Blood Culture adequate volume   Culture   Final    NO GROWTH 4 DAYS Performed at [facility], [address], [place], [place] [address]    Report Status PENDING  Incomplete         Radiology Studies: No results found.      Scheduled Meds:  [START ON 05/02/2021] enoxaparin (LOVENOX) injection  40 mg Subcutaneous Q24H   feeding supplement  237 mL Oral BID BM   ferrous sulfate  325 mg Oral Q breakfast   folic acid  1 mg Oral Daily   megestrol  400 mg Oral Daily   melatonin  5 mg Oral QHS   midodrine  10 mg Oral TID WC   multivitamin with minerals  1 tablet Oral Daily   polyethylene glycol  17 g Oral Daily   sodium bicarbonate  650 mg  Oral TID   sodium chloride  2 g Oral BID WC   sulfamethoxazole-trimethoprim  1 tablet Oral Q12H   Vitamin D (Ergocalciferol)  50,000 Units Oral Q7 days   Continuous Infusions:    LOS: 32 days        Val Riles, MD [facility]   To contact the attending provider between 7A-7P or the covering provider during after hours 7P-7A, please log into the web site [email or web address] and access using universal [place] password for that web site. If you do not have the password, please call the hospital operator.  05/01/2021, 12:14 PM

## 2021-05-02 LAB — CBC
HCT: 24 % — ABNORMAL LOW (ref 39.0–52.0)
Hemoglobin: 8.1 g/dL — ABNORMAL LOW (ref 13.0–17.0)
MCH: 27.1 pg (ref 26.0–34.0)
MCHC: 33.8 g/dL (ref 30.0–36.0)
MCV: 80.3 fL (ref 80.0–100.0)
Platelets: 209 10*3/uL (ref 150–400)
RBC: 2.99 MIL/uL — ABNORMAL LOW (ref 4.22–5.81)
RDW: 16.9 % — ABNORMAL HIGH (ref 11.5–15.5)
WBC: 7.2 10*3/uL (ref 4.0–10.5)
nRBC: 0 % (ref 0.0–0.2)

## 2021-05-02 LAB — BASIC METABOLIC PANEL
Anion gap: 7 (ref 5–15)
BUN: 10 mg/dL (ref 6–20)
CO2: 18 mmol/L — ABNORMAL LOW (ref 22–32)
Calcium: 8.2 mg/dL — ABNORMAL LOW (ref 8.9–10.3)
Chloride: 100 mmol/L (ref 98–111)
Creatinine, Ser: 0.44 mg/dL — ABNORMAL LOW (ref 0.61–1.24)
GFR, Estimated: >60 mL/min (ref >60)
Glucose, Bld: 86 mg/dL (ref 70–99)
Potassium: 4.5 mmol/L (ref 3.5–5.1)
Sodium: 125 mmol/L — ABNORMAL LOW (ref 135–145)

## 2021-05-02 LAB — OSMOLALITY: Osmolality: 258 mOsm/kg — ABNORMAL LOW (ref 275–295)

## 2021-05-02 MED ORDER — SODIUM BICARBONATE 650 MG PO TABS
650.0000 mg | ORAL_TABLET | Freq: Three times a day (TID) | ORAL | Status: AC
  Administered 2021-05-02 – 2021-05-03 (×3): 650 mg via ORAL
  Filled 2021-05-02 (×6): qty 1

## 2021-05-02 MED ORDER — SODIUM CHLORIDE 1 G PO TABS
2.0000 g | ORAL_TABLET | Freq: Three times a day (TID) | ORAL | Status: AC
  Administered 2021-05-02: 2 g via ORAL
  Filled 2021-05-02 (×3): qty 2

## 2021-05-02 NOTE — Progress Notes (Signed)
PROGRESS NOTE    Ryan [name]  [id number] DOB: 17-Dec-1960 DOA: 03/30/2021 PCP: Pcp, No   Chief Complaint  Patient presents with   Pain Management    Brief Narrative: Ryan [name] is a 60 y.o. male with medical history significant for history of polysubstance abuse including THC, cocaine, alcohol, remote history of tobacco use, history of right proximal femur inter trochanteric fracture status post right IM nail intertrochanteric fixation in 04/02/2021, does not follow-up with a PCP, presents to the emergency department for chief concerns of right hip pain.  Pain is chronic for several months, denies any trauma, difficulty ambulation due to pain. He also endorses an unintentional 35 pound weight loss in the last year, denies bright red blood per rectum and melena. patient was found to have L5 compression fracture, L3/4 severe stenosis.  She was also found to have widespread bony metastasis  from prostate cancer. Patient is a followed by oncology.  He needed SNF placement, not able to find accepting facility. He is intermittently refusing some meds.   He Was febrile over the weekend and his blood cultures show 1/4 bottles GPC, possibly contaminant. He has frequent urination and occasional dry cough. CXR unremarkable abut UA is positive for nitrites, rare bacteria. Urine cultures and repeat Blood cultures ordered, if they are negative will discontinue antibiotics in the next 24 to 48 hours.   Pt seen and examined at bedside, no new complaints.    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 Neurosurgery consulted recommended not a surgical candidate Pain control. Recommend outpatient follow up with oncology for treatment.  Suspect he will remain in the hospital the end of the month   Severe symptomatic anemia with folic acid deficiency 123456 Hb 7.0, transfused 1 unit of PRBC 7/24 Hb 8.1 stable  Monitor H&H Transfuse to keep hemoglobin greater than 7.   Hypomagnesemia, mag repleted.  Monitor and replete as needed.  Hypotonic hyponatremia: Serum osmolarity 264, continue fluid restriction 1.5 L/day 7/24 Na 125  asymptomatic 7/22 started Nacl 2 g po BID x 3 days and 7/24 increased Nacl 2 g po tid for 2 days Follow repeat serum osmolarity, urine osmolarity and urine electrolytes Continue fluid restriction 1.5 L/day and monitor sodium level daily Continue to monitor   Hypokalemia, resolved non anion gap metabolic acidosis, [id number] started po bicarb 650 TID x 4 days    Cancer cachexia with severe protein calorie malnutrition Dietary on board recommend encouraging oral intake.  Left heel pain:  X rays of the foot ordered to evaluate for pathological fractures.  Negative.  Increased oxycodone 1 to 2 pill every 6 hours prn.   Hypotension:  Asymptomatic, keep MAP greater than 65, on Midodrine 10 mg po TID Stable on Midodrine   Fever: Blood cultures ordered, get CXR and UA.  Was febrile over the weekend and his blood cultures show 1/4 bottles GPC staph epidermidis possibly contaminant. He has frequent urination and occasional dry  cough. CXR unremarkable abut UA is positive for nitrites, rare bacteria. Urine cultures and repeat Blood cultures ordered, if they are negative will discontinue antibiotics in the next 24 to 48 hours. Urine cultures showing providentia >1,00,000, sensitive to ceftriaxone and Bactrim.   S/p ceftriaxone x2 days, changed to Bactrim for 5 days, total 7 days Abx     DVT prophylaxis: lovenox.  Code Status: full code.  Family Communication: none at bedside.  Disposition:   Status is: Inpatient  Remains inpatient appropriate because:Unsafe d/c plan  Dispo: The patient is from: Home              Anticipated d/c is to: SNF              Patient currently is medically stable to d/c.   Difficult to place patient Yes       Consultants:  Oncology.   Procedures: none.   Antimicrobials: ceftriaxone x 2 doses                             Bactrim for 5 days  Subjective: No overnight issues, patient was sleepy and was asking for breakfast which was on the tray, stated that he is feeling little better as compared to yesterday.  Denied any other active issues.   Objective: Vitals:   05/01/21 2006 05/02/21 0345 05/02/21 0755 05/02/21 1151  BP: 128/74 118/71 106/63 106/67  Pulse: 79 (!) 108 (!) 103 (!) 103  Resp: '18 18 16 18  '$ Temp: 97.9 F (36.6 C) 98.1 F (36.7 C) 98.6 F (37 C) 98.4 F (36.9 C)  TempSrc: Oral Oral Oral Oral  SpO2: 96% 98% 100% 100%  Weight:      Height:        Intake/Output Summary (Last 24 hours) at 05/02/2021 1233 Last data filed at 05/02/2021 1024 Gross per 24 hour  Intake 360 ml  Output 200 ml  Net 160 ml   Filed Weights   04/01/21 1232 04/08/21 0800 04/14/21 1246  Weight: 54.7 kg 41.6 kg 51.5 kg    Examination:  General exam: Cachetic, ill appearing gentleman, not in distress.  Respiratory system: Air entry fair, no wheezing heard, on RA.  Cardiovascular system: RRR, no JVD, no pedal edema.  Gastrointestinal system: Abdomen is soft, NT BS+ Central nervous system: CN grossly intact, right lower extremity weakness, no any other focal deficits  Extremities:  no pedal edema, muscle wasting seen on extremities.  Skin: No rashes seen.  Psychiatry: Mood  is appropriate.     Data Reviewed: I have personally reviewed following labs  and imaging studies  CBC: Recent Labs  Lab 04/27/21 0453 04/29/21 0600 04/30/21 0424 05/01/21 0537 05/02/21 0426  WBC 8.4 6.6 5.6 7.3 7.2  HGB 7.5* 7.0* 8.4* 8.8* 8.1*  HCT 22.9* 21.2* 25.7* 25.5* 24.0*  MCV 80.9 80.0 78.6* 80.7 80.3  PLT 192 193 211 182 [id number]    Basic Metabolic Panel: Recent Labs  Lab 04/29/21 0600 04/30/21 0424 05/01/21 0537 05/02/21 0426  NA 130* 129* 129* 125*  K 4.3 4.4 4.6 4.5  CL 103 104 103 100  CO2 19* 18* 20* 18*  GLUCOSE 97 96 88 86  BUN '12 11 10 10  '$ CREATININE 0.50* 0.43* 0.41* 0.44*  CALCIUM 8.1* 8.0* 8.2* 8.2*  MG 1.6* 1.7 1.7  --   PHOS 3.4 3.4 3.4  --     GFR: Estimated Creatinine Clearance: 72.4 mL/min (A) (by C-G formula  based on SCr of 0.44 mg/dL (L)).  Liver Function Tests: No results for input(s): AST, ALT, ALKPHOS, BILITOT, PROT, ALBUMIN in the last 168 hours.  CBG: No results for input(s): GLUCAP in the last 168 hours.   Recent Results (from the past 240 hour(s))  Culture, blood (Routine X 2) w Reflex to ID Panel     Status: None   Collection Time: 04/25/21 11:21 AM   Specimen: BLOOD  Result Value Ref Range Status   Specimen Description BLOOD BLOOD RIGHT HAND  Final   Special Requests   Final    BOTTLES DRAWN AEROBIC AND ANAEROBIC Blood Culture adequate volume   Culture   Final    NO GROWTH 5 DAYS Performed at [facility], [address] [place], [place] [address]    Report Status 04/30/2021 FINAL  Final  Culture, blood (Routine X 2) w Reflex to ID Panel     Status: Abnormal   Collection Time: 04/25/21 11:22 AM   Specimen: BLOOD  Result Value Ref Range Status   Specimen Description   Final    BLOOD BLOOD LEFT HAND Performed at [facility], [address] [place], [place] [address]    Special Requests   Final    BOTTLES DRAWN AEROBIC AND ANAEROBIC Blood Culture adequate volume Performed at [facility], [address] [place], [place] [address]    Culture  Setup Time   Final     GRAM POSITIVE COCCI AEROBIC BOTTLE ONLY CRITICAL RESULT CALLED TO, READ BACK BY AND VERIFIED WITH: DEVIN MITVHELL 04/26/21 0956 [facility]    Culture (A)  Final    STAPHYLOCOCCUS EPIDERMIDIS THE SIGNIFICANCE OF ISOLATING THIS ORGANISM FROM A SINGLE SET OF BLOOD CULTURES WHEN MULTIPLE SETS ARE DRAWN IS UNCERTAIN. PLEASE NOTIFY THE MICROBIOLOGY DEPARTMENT WITHIN ONE WEEK IF SPECIATION AND SENSITIVITIES ARE REQUIRED. Performed at [facility], [place] [address] [place], [place] [address]    Report Status 04/27/2021 FINAL  Final  Blood Culture ID Panel (Reflexed)     Status: Abnormal   Collection Time: 04/25/21 11:22 AM  Result Value Ref Range Status   Enterococcus faecalis NOT DETECTED NOT DETECTED Final   Enterococcus Faecium NOT DETECTED NOT DETECTED Final   Listeria monocytogenes NOT DETECTED NOT DETECTED Final   Staphylococcus species DETECTED (A) NOT DETECTED Final    Comment: CRITICAL RESULT CALLED TO, READ BACK BY AND VERIFIED WITH: DEVIN MITCHELL 04/26/21 0956 SJL    Staphylococcus aureus (BCID) NOT DETECTED NOT DETECTED Final   Staphylococcus epidermidis DETECTED (A) NOT DETECTED Final    Comment: CRITICAL RESULT CALLED TO, READ BACK BY AND VERIFIED WITH: DEVIN MITCHELL 04/26/21 0956 [facility]    Staphylococcus lugdunensis NOT DETECTED NOT DETECTED Final   Streptococcus species NOT DETECTED NOT DETECTED Final   Streptococcus agalactiae NOT DETECTED NOT DETECTED Final   Streptococcus pneumoniae NOT DETECTED NOT DETECTED Final   Streptococcus pyogenes NOT DETECTED NOT DETECTED Final   A.calcoaceticus-baumannii NOT DETECTED NOT DETECTED Final   Bacteroides fragilis NOT DETECTED NOT DETECTED Final   Enterobacterales NOT DETECTED NOT DETECTED Final   Enterobacter cloacae complex NOT DETECTED NOT DETECTED Final   Escherichia coli NOT DETECTED NOT DETECTED Final   Klebsiella aerogenes NOT DETECTED NOT DETECTED Final   Klebsiella oxytoca NOT DETECTED NOT DETECTED Final   Klebsiella  pneumoniae NOT DETECTED NOT DETECTED Final   Proteus species NOT DETECTED NOT DETECTED Final   Salmonella species NOT DETECTED NOT DETECTED Final   Serratia marcescens NOT DETECTED  NOT DETECTED Final   Haemophilus influenzae NOT DETECTED NOT DETECTED Final   Neisseria meningitidis NOT DETECTED NOT DETECTED Final   Pseudomonas aeruginosa NOT DETECTED NOT DETECTED Final   Stenotrophomonas maltophilia NOT DETECTED NOT DETECTED Final   Candida albicans NOT DETECTED NOT DETECTED Final   Candida auris NOT DETECTED NOT DETECTED Final   Candida glabrata NOT DETECTED NOT DETECTED Final   Candida krusei NOT DETECTED NOT DETECTED Final   Candida parapsilosis NOT DETECTED NOT DETECTED Final   Candida tropicalis NOT DETECTED NOT DETECTED Final   Cryptococcus neoformans/gattii NOT DETECTED NOT DETECTED Final   Methicillin resistance mecA/C NOT DETECTED NOT DETECTED Final    Comment: Performed at [facility], [address], [place], [place] [address]  Urine Culture     Status: Abnormal   Collection Time: 04/25/21  8:30 PM   Specimen: Urine, Clean Catch  Result Value Ref Range Status   Specimen Description   Final    URINE, CLEAN CATCH Performed at [facility], [address], [place], [place] [address]    Special Requests   Final    NONE Performed at [facility], [address], [place], [place] [address]    Culture >=100,000 COLONIES/mL PROVIDENCIA RETTGERI (A)  Final   Report Status 04/28/2021 FINAL  Final   Organism ID, Bacteria PROVIDENCIA RETTGERI (A)  Final      Susceptibility   Providencia rettgeri - MIC*    AMPICILLIN RESISTANT Resistant     CEFAZOLIN >=64 RESISTANT Resistant     CEFEPIME <=0.12 SENSITIVE Sensitive     CEFTRIAXONE <=0.25 SENSITIVE Sensitive     CIPROFLOXACIN <=0.25 SENSITIVE Sensitive     GENTAMICIN <=1 SENSITIVE Sensitive     IMIPENEM 2 SENSITIVE Sensitive     NITROFURANTOIN 128 RESISTANT Resistant     TRIMETH/SULFA <=20 SENSITIVE  Sensitive     AMPICILLIN/SULBACTAM <=2 SENSITIVE Sensitive     PIP/TAZO <=4 SENSITIVE Sensitive     * >=100,000 COLONIES/mL PROVIDENCIA RETTGERI  CULTURE, BLOOD (ROUTINE X 2) w Reflex to ID Panel     Status: None (Preliminary result)   Collection Time: 04/27/21  4:53 AM   Specimen: BLOOD  Result Value Ref Range Status   Specimen Description BLOOD LEFT FA  Final   Special Requests   Final    BOTTLES DRAWN AEROBIC AND ANAEROBIC Blood Culture adequate volume   Culture   Final    NO GROWTH 4 DAYS Performed at [facility], [address] [place], [place] [address]    Report Status PENDING  Incomplete  CULTURE, BLOOD (ROUTINE X 2) w Reflex to ID Panel     Status: None (Preliminary result)   Collection Time: 04/27/21  4:53 AM   Specimen: BLOOD LEFT HAND  Result Value Ref Range Status   Specimen Description BLOOD LEFT HAND  Final   Special Requests   Final    BOTTLES DRAWN AEROBIC AND ANAEROBIC Blood Culture adequate volume   Culture   Final    NO GROWTH 4 DAYS Performed at [facility], [address], [place], [place] [address]    Report Status PENDING  Incomplete         Radiology Studies: No results found.      Scheduled Meds:  enoxaparin (LOVENOX) injection  40 mg Subcutaneous Q24H   feeding supplement  237 mL Oral BID BM   ferrous sulfate  325 mg Oral Q breakfast   folic acid  1 mg Oral Daily  megestrol  400 mg Oral Daily   melatonin  5 mg Oral QHS   midodrine  10 mg Oral TID WC   multivitamin with minerals  1 tablet Oral Daily   polyethylene glycol  17 g Oral Daily   sodium bicarbonate  650 mg Oral TID   sodium chloride  2 g Oral TID WC   sulfamethoxazole-trimethoprim  1 tablet Oral Q12H   Vitamin D (Ergocalciferol)  50,000 Units Oral Q7 days   Continuous Infusions:    LOS: 33 days        Val Riles, MD [facility]   To contact the attending provider between 7A-7P or the covering provider during after hours 7P-7A,  please log into the web site [email or web address] and access using universal [place] password for that web site. If you do not have the password, please call the hospital operator.  05/02/2021, 12:33 PM

## 2021-05-03 ENCOUNTER — Other Ambulatory Visit: Payer: Self-pay

## 2021-05-03 DIAGNOSIS — C7951 Secondary malignant neoplasm of bone: Secondary | ICD-10-CM

## 2021-05-03 DIAGNOSIS — C61 Malignant neoplasm of prostate: Secondary | ICD-10-CM

## 2021-05-03 LAB — BASIC METABOLIC PANEL
Anion gap: 6 (ref 5–15)
BUN: 11 mg/dL (ref 6–20)
CO2: 19 mmol/L — ABNORMAL LOW (ref 22–32)
Calcium: 7.8 mg/dL — ABNORMAL LOW (ref 8.9–10.3)
Chloride: 102 mmol/L (ref 98–111)
Creatinine, Ser: 0.53 mg/dL — ABNORMAL LOW (ref 0.61–1.24)
GFR, Estimated: >60 mL/min (ref >60)
Glucose, Bld: 116 mg/dL — ABNORMAL HIGH (ref 70–99)
Potassium: 4.4 mmol/L (ref 3.5–5.1)
Sodium: 127 mmol/L — ABNORMAL LOW (ref 135–145)

## 2021-05-03 LAB — CULTURE, BLOOD (ROUTINE X 2)
Culture: NO GROWTH
Culture: NO GROWTH
Special Requests: ADEQUATE
Special Requests: ADEQUATE

## 2021-05-03 LAB — CBC
HCT: 23.8 % — ABNORMAL LOW (ref 39.0–52.0)
Hemoglobin: 7.9 g/dL — ABNORMAL LOW (ref 13.0–17.0)
MCH: 26.2 pg (ref 26.0–34.0)
MCHC: 33.2 g/dL (ref 30.0–36.0)
MCV: 79.1 fL — ABNORMAL LOW (ref 80.0–100.0)
Platelets: 227 10*3/uL (ref 150–400)
RBC: 3.01 MIL/uL — ABNORMAL LOW (ref 4.22–5.81)
RDW: 16.6 % — ABNORMAL HIGH (ref 11.5–15.5)
WBC: 8.1 10*3/uL (ref 4.0–10.5)
nRBC: 0 % (ref 0.0–0.2)

## 2021-05-03 LAB — PSA: Prostatic Specific Antigen: >3000 ng/mL — ABNORMAL HIGH (ref 0.00–4.00)

## 2021-05-03 MED ORDER — MIRTAZAPINE 15 MG PO TABS
15.0000 mg | ORAL_TABLET | Freq: Every day | ORAL | Status: DC
  Administered 2021-05-03 – 2021-06-01 (×26): 15 mg via ORAL
  Filled 2021-05-03 (×28): qty 1

## 2021-05-03 MED ORDER — ZOLEDRONIC ACID 4 MG/5ML IV CONC
4.0000 mg | Freq: Once | INTRAVENOUS | Status: AC
  Administered 2021-05-04: 4 mg via INTRAVENOUS
  Filled 2021-05-03: qty 5

## 2021-05-03 MED ORDER — DEGARELIX ACETATE(240 MG DOSE) 120 MG/VIAL ~~LOC~~ SOLR
240.0000 mg | Freq: Once | SUBCUTANEOUS | Status: DC
  Filled 2021-05-03: qty 6

## 2021-05-03 NOTE — Progress Notes (Signed)
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  INTERVAL HISTORY: Patient remains in the hospital secondary to difficult placement issues.  He does not complain of any pain today.  He has a good appetite and is actively eating lunch.  By report, he has intermittently refused physical therapy as well as medications.    REVIEW OF SYSTEMS:   Review of Systems  Constitutional: Negative.  Negative for fever and weight loss.  Respiratory: Negative.  Negative for cough, hemoptysis and shortness of breath.   Cardiovascular: Negative.  Negative for chest pain and leg swelling.  Gastrointestinal: Negative.  Negative for abdominal pain.  Genitourinary: Negative.  Negative for dysuria.  Musculoskeletal: Negative.  Negative for back pain.  Skin: Negative.  Negative for rash.  Neurological: Negative.  Negative for dizziness, sensory change, focal weakness, weakness and headaches.  Psychiatric/Behavioral: Negative.  The patient is not nervous/anxious.    As per HPI. Otherwise, a complete review of systems is negative.  PAST MEDICAL HISTORY: Past Medical History:  Diagnosis Date   Adenomatous colon polyp    Anemia    Depression    Elevated LFTs     PAST SURGICAL HISTORY: Past Surgical History:  Procedure Laterality Date   FRACTURE SURGERY     INTRAMEDULLARY (IM) NAIL INTERTROCHANTERIC Right 04/02/2020   Procedure: INTRAMEDULLARY (IM) NAIL INTERTROCHANTRIC;  Surgeon: Corky Mull, MD;  Location: [facility] ORS;  Service: Orthopedics;  Laterality: Right;    FAMILY HISTORY: Family History  Problem Relation Age of Onset   Heart disease Other    Colon polyps Cousin        maternal   Colon polyps Cousin    Colon polyps Maternal Aunt    Colon cancer Maternal Grandfather      ADVANCED DIRECTIVES (Y/N):  $Remov'@[email or web address]'[id number]$ @  HEALTH MAINTENANCE: Social History   Tobacco Use   Smoking status: Some Days   Smokeless tobacco: Never  Substance Use Topics   Alcohol use: Yes    Comment: 4 beers weekly   Drug use: No     Colonoscopy:  PAP:  Bone density:  Lipid panel:  No Known Allergies  Current Facility-Administered Medications  Medication Dose Route Frequency Provider Last Rate Last Admin   acetaminophen (TYLENOL) tablet 650 mg  650 mg Oral Q4H PRN Hosie Poisson, MD   650 mg at 05/01/21 0446   bisacodyl (DULCOLAX) EC tablet 5 mg  5 mg Oral Daily PRN Val Riles, MD       bisacodyl (DULCOLAX) suppository 10 mg  10 mg Rectal Daily PRN Val Riles, MD       diclofenac Sodium (VOLTAREN) 1 % topical gel 2 g  2 g Topical PRN Nolberto Hanlon, MD   2 g at 04/22/21 1515   enoxaparin (LOVENOX) injection 40 mg  40 mg Subcutaneous Q24H Patel, Kishan S, RPH       feeding supplement (ENSURE ENLIVE / ENSURE PLUS) liquid 237 mL  237 mL Oral BID BM Nolberto Hanlon, MD   237 mL at 05/03/21 1113   ferrous sulfate tablet 325 mg  325 mg Oral Q breakfast Cox, Amy N, DO   325 mg at 83/66/29 4765   folic acid (FOLVITE) tablet 1 mg  1 mg Oral Daily Cox, Amy N, DO   1 mg at 05/01/21 4650   guaiFENesin-dextromethorphan (  ROBITUSSIN DM) 100-10 MG/5ML syrup 5 mL  5 mL Oral Q8H PRN Hosie Poisson, MD   5 mL at 05/01/21 1008   hydrOXYzine (ATARAX/VISTARIL) tablet 25 mg  25 mg Oral TID PRN Val Riles, MD   25 mg at 04/20/21 1246   megestrol (MEGACE) 400 MG/10ML suspension 400 mg  400 mg Oral Daily Sharen Hones, MD   400 mg at 05/02/21 1110   melatonin tablet 5 mg  5 mg Oral QHS Val Riles, MD   5 mg at 05/02/21 2124   midodrine (PROAMATINE) tablet 10 mg  10 mg Oral TID WC Val Riles, MD   10 mg at 05/02/21 1542   mirtazapine (REMERON) tablet 15 mg  15 mg Oral QHS Ralene Muskrat B, MD       multivitamin with minerals tablet 1 tablet  1 tablet Oral Daily Cox, Amy N, DO   1 tablet at  04/26/21 0951   ondansetron (ZOFRAN) tablet 4 mg  4 mg Oral Q6H PRN Cox, Amy N, DO       Or   ondansetron (ZOFRAN) injection 4 mg  4 mg Intravenous Q6H PRN Cox, Amy N, DO   4 mg at 04/14/21 2094   oxyCODONE (Oxy IR/ROXICODONE) immediate release tablet 5-10 mg  5-10 mg Oral Q6H PRN Hosie Poisson, MD   10 mg at 05/02/21 2127   phenol (CHLORASEPTIC) mouth spray 1 spray  1 spray Mouth/Throat PRN Val Riles, MD   1 spray at 04/02/21 0155   polyethylene glycol (MIRALAX / GLYCOLAX) packet 17 g  17 g Oral Daily Val Riles, MD   17 g at 04/25/21 1130   sodium bicarbonate tablet 650 mg  650 mg Oral TID Val Riles, MD   650 mg at 05/02/21 2124   sodium chloride tablet 2 g  2 g Oral TID WC Val Riles, MD   2 g at 05/02/21 1542   traZODone (DESYREL) tablet 50 mg  50 mg Oral QHS PRN Val Riles, MD   50 mg at 04/30/21 2255   Vitamin D (Ergocalciferol) (DRISDOL) capsule 50,000 Units  50,000 Units Oral Q7 days Val Riles, MD   50,000 Units at 04/28/21 1812    OBJECTIVE: Vitals:   05/03/21 0836 05/03/21 1234  BP: 97/62 101/60  Pulse: 99 (!) 103  Resp: 18 16  Temp: 98.7 F (37.1 C) 98.8 F (37.1 C)  SpO2: 97% 100%     Body mass index is 13.46 kg/m.    ECOG FS:2 - Symptomatic, <50% confined to bed  General: Thin, no acute distress. Eyes: Pink conjunctiva, anicteric sclera. HEENT: Normocephalic, moist mucous membranes. Lungs: No audible wheezing or coughing. Heart: Regular rate and rhythm. Abdomen: Soft, nontender, no obvious distention. Musculoskeletal: No edema, cyanosis, or clubbing. Neuro: Alert, answering all questions appropriately. Cranial nerves grossly intact. Skin: No rashes or petechiae noted. Psych: Normal affect.   LAB RESULTS:  Lab Results  Component Value Date   NA 127 (L) 05/03/2021   K 4.4 05/03/2021   CL 102 05/03/2021   CO2 19 (L) 05/03/2021   GLUCOSE 116 (H) 05/03/2021   BUN 11 05/03/2021   CREATININE 0.53 (L) 05/03/2021   CALCIUM 7.8 (L) 05/03/2021    PROT 4.8 (L) 04/03/2021   ALBUMIN 2.0 (L) 04/04/2021   AST 35 04/03/2021   ALT 6 04/03/2021   ALKPHOS 504 (H) 04/03/2021   BILITOT 0.7 04/03/2021   GFRNONAA >60 05/03/2021   GFRAA >60 04/06/2020    Lab Results  Component Value Date  WBC 8.1 05/03/2021   NEUTROABS 4.5 04/14/2021   HGB 7.9 (L) 05/03/2021   HCT 23.8 (L) 05/03/2021   MCV 79.1 (L) 05/03/2021   PLT 227 05/03/2021     STUDIES: MR BRAIN W WO CONTRAST  Result Date: 04/03/2021 CLINICAL DATA:  Metastatic disease evaluation. EXAM: MRI HEAD WITHOUT AND WITH CONTRAST TECHNIQUE: Multiplanar, multiecho pulse sequences of the brain and surrounding structures were obtained without and with intravenous contrast. CONTRAST:  7.33mL GADAVIST GADOBUTROL 1 MMOL/ML IV SOLN COMPARISON:  CT head April 01, 2021. FINDINGS: Brain: Punctate nonenhancing focus of restricted diffusion in the high right juxtacortical frontal lobe (series 2, image 38). Additional punctate focus of DWI hyperintensity in the region left trigeminal nerve (series 2, image 16). No hydrocephalus. No abnormal mass effect. No extra-axial fluid collection. No acute hemorrhage. Generalized atrophy with ex vacuo ventricular dilation. Vascular: Major arterial flow voids are maintained at the skull base. Skull and upper cervical spine: Diffuse abnormal calvarial and skull base signal with patchy enhancement, compatible with diffuse osseous metastatic disease. Sinuses/Orbits: Atelectatic right maxillary sinus with mild right maxillary mucosal thickening. Mild right frontoethmoidal mucosal thickening. Other: Large left and moderate right mastoid effusions. IMPRESSION: 1. Punctate nonenhancing focus of restricted diffusion in the high right juxtacortical frontal lobe. This most likely represents a small acute infarct; however, early nonenhancing metastatic lesion is not excluded and follow-up MRI with contrast in approximately 4 weeks is recommended to ensure resolution. 2. Additional  punctate focus of DWI hyperintensity in the region of the cisternal left trigeminal nerve without enhancement. While potentially artifactual, this finding also warrants attention on the recommended short interval follow-up MRI. 3. 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 4. Large left and moderate right mastoid effusions. 5. Mild right ostiomeatal unit pattern paranasal sinus mucosal thickening. Electronically Signed   By: Margaretha Sheffield MD   On: 04/03/2021 15:55   DG Chest Port 1 View  Result Date: 04/25/2021 CLINICAL DATA:  Fever.  Prostate cancer with metastases to the bone. EXAM: PORTABLE CHEST 1 VIEW COMPARISON:  March 30, 2021 FINDINGS: The study is limited due to the kyphotic positioning. No pneumothorax. Diffuse widespread bony metastatic disease. The heart, hila, and visualized mediastinum are unremarkable although the superior mediastinum is not well seen. No pulmonary nodules, masses, or definitive focal infiltrates. IMPRESSION: The study is limited due to positioning. A better positioned PA and lateral chest x-ray could better evaluate the lungs. Within these limitations, no cause for fever identified. Widespread bony metastatic disease. Electronically Signed   By: Dorise Bullion III M.D   On: 04/25/2021 12:54   DG Foot Complete Left  Result Date: 04/23/2021 CLINICAL DATA:  Heel pain sharp pain in heel in a 60 year old male. No reported injury. EXAM: LEFT FOOT - COMPLETE 3+ VIEW COMPARISON:  December of 2014. FINDINGS: Material seen about the hindfoot on the AP view and oblique view limiting bony detail somewhat. Osteopenia. No sign of acute fracture or dislocation. No significant soft tissue swelling. Pes planus as on the previous study. IMPRESSION: 1. Osteopenia. No acute findings. 2. Pes planus. Electronically Signed   By: Zetta Bills M.D.   On: 04/23/2021 14:03   MR TOTAL SPINE METS SCREENING  Result Date: 04/03/2021 CLINICAL DATA:  Metastatic disease  evaluation. EXAM: MRI TOTAL SPINE WITHOUT AND WITH CONTRAST TECHNIQUE: Multisequence MR imaging of the spine from the cervical spine to the sacrum was performed prior to and following IV contrast administration for evaluation of spinal metastatic disease.  CONTRAST:  7.22mL GADAVIST GADOBUTROL 1 MMOL/ML IV SOLN COMPARISON:  CT chest/abdomen/pelvis March 30, 2021. CT lumbar spine April 01, 2021. FINDINGS: MRI CERVICAL SPINE FINDINGS Alignment: No substantial sagittal subluxation. Vertebrae: Diffuse heterogeneity of the bone marrow with heterogeneous/patchy enhancement, compatible with diffuse metastatic disease. No linear marrow edema or vertebral body height loss to suggest acute fracture. Cord: Motion limited evaluation without convincing cord signal abnormality. Postcontrast imaging is limited by motion without obvious evidence of epidural extension of tumor. Posterior Fossa, vertebral arteries, paraspinal tissues: The visualized vertebral artery flow voids are maintained. Posterior fossa is better assessed on same day MRI head. Disc levels: Severe multilevel degenerative disc disease in the cervical spine. There is mild to moderate canal stenosis at C3-C4 and severe canal stenosis at C4-C5 and C5-C6. Likely severe bilateral foraminal stenosis at C3-C4, C4-C5, C5-C6, and C6-C7. MRI THORACIC SPINE FINDINGS Alignment:  Normal. Vertebrae: Diffuse heterogeneity of the bone marrow with heterogeneous/patchy enhancement, compatible with diffuse metastatic disease. Mild height loss of the T3 and T8 through T11 vertebral bodies, suggestive pathologic fractures that may be subacute or chronic given no convincing marrow edema. Cord:  No evidence of abnormal cord signal. Paraspinal and other soft tissues: Enhancing tumor extends into the ventral and left lateral paraspinal soft tissues from T2 to T4-T5 and into the epidural space at T2-T3 to involve the left subarticular recess and left T2-T3 foramen (see series 38, image 4).  Resulting left T2-T3 foraminal narrowing without significant canal stenosis. Motion limited evaluation without any other sites of paraspinal/epidural tumor extension.Please see recent CT chest abdomen and pelvis for extra-spinal evaluation. Disc levels: Posterior disc bulges on the left at T9-T10, T10-T11 which partially efface ventral CSF with mild canal stenosis at these levels. MRI LUMBAR SPINE FINDINGS Segmentation:  Transitional lumbosacral anatomy with lumbarized S1. Alignment:  No substantial sagittal subluxation. Vertebrae: Deformity of the inferior L5 endplate, similar to prior CT chest abdomen/pelvis. Otherwise, lumbar vertebral body heights are maintained with scattered degenerative Schmorl's nodes. Conus medullaris: Extends to the L1-L2 level and appears normal. No evidence of abnormal enhancement of the conus or cauda equina nerve roots. No evidence of epidural tumor involvement. Paraspinal and other soft tissues: No paraspinal extension of tumor identified. Please see recent CT chest abdomen and pelvis for extra-spinal evaluation. Disc levels: Posterior disc bulges at L3-L4 and L4-L5 with mild canal and bilateral subarticular recess stenosis at these levels. Multilevel mild foraminal stenosis. IMPRESSION: 1. Extensive diffuse osseous metastatic disease throughout the cervical, thoracic and lumbar spine. 2. Enhancing tumor extends into the ventral and left lateral paraspinal soft tissues from T2 to T4-T5 and into the epidural space at T2-T3 to involve the left subarticular recess and left T2-T3 foramen (see series 38, image 4). Resulting left T2-T3 foraminal narrowing without significant canal stenosis. Motion limited evaluation without any other sites of paraspinal/epidural tumor identified. 3. Mild height loss of the T3 and T8 through T11 vertebral bodies and deformity of the inferior L5 endplate, similar to prior CT chest/abdomen/pelvis and suggestive of pathologic fractures that may be subacute or  chronic given no convincing marrow edema. 4. Severe multilevel degenerative change of the cervical spine. Severe canal stenosis C4-C5 and C5-C6. Likely severe bilateral foraminal stenosis at C3-C4, C4-C5, C5-C6, and C6-C7. Dedicated MRI of the cervical spine could further characterize if clinically indicated. 5. Small disc bulges at T9-T10, T10-T11, L3-L4, and L4-L5 with mild canal stenosis at these levels. 6. Please see recent CT chest/abdomen/pelvis for extra-spinal valuation. 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 CT scan results reviewed independently and reported as above with widespread bony disease, but no obvious visceral lesions.  Patient's PSA is greater than 3000 repeat testing from today is pending at time of dictation.  Currently, there is no benefit in doing a biopsy.  Molecular studies may be necessary, but these are difficult to accurately obtain off a bone biopsy.  Can consider prostate biopsy in the future.  Bone marrow biopsy is also a consideration to assess for marrow infiltration, but is not necessary at this point.  Patient received Zometa for his bony disease and Firmagon loading dose 240 mg on approximately April 02, 2021.  Will order his next dose of 4 mg IV Zometa and 120 mg Firmagon to be given today.  Follow-up will be based pending discharge from hospital. Anemia: Chronic and unchanged.  Patient's hemoglobin today is 7.9. Given the extent of patient's bony disease, he may have marrow infiltration.  Treatment for his prostate cancer as above.  Continue to maintain hemoglobin greater than 8.0. Thrombocytopenia: Resolved. Hypocalcemia: Given patient's decreased albumin, calcium levels corrected to near normal.  Proceed with Zometa as above. Substance abuse: Patient has a history of polysubstance abuse including  cocaine.  Hypomagnesia: Resolved. Disposition: Difficult placement.  His next Prostate cancer treatment will be due on or near May 31, 2021.  Will follow.  Lloyd Huger, MD   05/03/2021 1:40 PM

## 2021-05-03 NOTE — Progress Notes (Signed)
OT Cancellation Note  Patient Details Name: Wendell Wolle MRN: [id number] DOB: [birth year]   Cancelled Treatment:    Reason Eval/Treat Not Completed: Fatigue/lethargy limiting ability to participate. Mr. Olshansky states he is very tired at present, after 2 nights of poor sleep. He requests that this therapist come to his room tomorrow at 1 PM for OT.  Josiah Lobo, PhD, MS, OTR/L 05/03/21, 2:28 PM

## 2021-05-03 NOTE — Progress Notes (Signed)
Patient request I message MD. He is wanting to delay getting Zometa and Firmagon tomorrow 05/04/21. Stating MD told him tomorrow. He is also very anxious about the subq injection of Firmagon. Initially refusing that shot. Stating doctor told him he didn't have to take shots. I educated patient that these medications were part of his cancer treatment medications, not his DVT prophylactic medication. He continues to insist that we give tomorrow. I messaged Dr. Kennith Gain on secure messenger, explaining the situation. Waiting for a response.

## 2021-05-03 NOTE — Progress Notes (Signed)
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 TLSO recommended from nurosurgery and devliered to room.    PT Comments    Pt seen for PT tx with pt reporting BM incontinence & rolling L<>R with supervision with use of bed rails to allow PT to perform peri hygiene dependently. Pt declines getting to [facility] despite max encouragement. Pt declines donning mesh underwear & pants in standing, instead electing to complete task at bed level with pt requiring max assist to thread on BLE but pt able to bridge to pull over hips. Pt transfers to sitting EOB with mod assist & sit>stand with min assist. Pt ambulates in hallway with RW & min assist with impaired gait pattern as noted below; PT educates pt on proper use of AD. Pt left in recliner & encouraged to sit up. PT did don L post op shoe total assist during session, pt declines using TLSO.    Follow Up Recommendations  SNF     Equipment Recommendations  3in1 (PT);Wheelchair cushion (measurements PT);Rolling walker with 5" wheels;Wheelchair (measurements PT);Hospital bed    Recommendations for Other Services       Precautions / Restrictions Precautions Precautions: Fall Required Braces or Orthoses: Spinal Brace Spinal Brace: Thoracolumbosacral orthotic Restrictions Weight Bearing Restrictions: No Other Position/Activity Restrictions: LLE post op shoe    Mobility  Bed Mobility Overal bed mobility: Needs Assistance Bed Mobility: Rolling Rolling: Supervision (bed rails) Sidelying  to sit: Mod assist (cuing to use bed rails, assistance to elevate trunk to come to sitting EOB)            Transfers Overall transfer level: Needs assistance Equipment used: Rolling walker (2 wheeled) Transfers: Sit to/from Stand Sit to Stand: Min assist            Ambulation/Gait Ambulation/Gait assistance: Herbalist (Feet): 40 Feet Assistive device: Rolling walker (2 wheeled) Gait Pattern/deviations: Decreased step length - right;Decreased step length - left;Decreased stride length;Decreased dorsiflexion - right;Decreased dorsiflexion - left;Trunk flexed Gait velocity: decreased   General Gait Details: heavy trunk lean on RW, L trendelenburg gait, cuing to ambulate within base of AD   Stairs             Wheelchair Mobility    Modified Rankin (Stroke Patients Only)       Balance Overall balance assessment: Needs assistance Sitting-balance support: Feet supported;Bilateral upper extremity supported Sitting balance-Leahy Scale: Good     Standing balance support: Bilateral upper extremity supported;During functional activity Standing balance-Leahy Scale: Fair Standing balance comment: requires UE support on RW                            Cognition Arousal/Alertness: Awake/alert Behavior During Therapy: WFL for tasks assessed/performed;Flat affect Overall Cognitive Status: Within Functional Limits for tasks assessed                                 General Comments: requires MAX encouragement to participate      Exercises  General Comments        Pertinent Vitals/Pain Pain Assessment: Faces Faces Pain Scale: Hurts a little bit Pain Location: generalized Pain Descriptors / Indicators: Discomfort;Grimacing Pain Intervention(s): Limited activity within patient's tolerance;Repositioned;Monitored during session    Home Living                      Prior Function            PT Goals (current  goals can now be found in the care plan section) Acute Rehab PT Goals Patient Stated Goal: to get stronger PT Goal Formulation: With patient Time For Goal Achievement: 05/11/21 Potential to Achieve Goals: Fair Progress towards PT goals: Progressing toward goals    Frequency    Min 2X/week      PT Plan Current plan remains appropriate    Co-evaluation              AM-PAC PT "6 Clicks" Mobility   Outcome Measure  Help needed turning from your back to your side while in a flat bed without using bedrails?: A Little Help needed moving from lying on your back to sitting on the side of a flat bed without using bedrails?: A Lot Help needed moving to and from a bed to a chair (including a wheelchair)?: A Little Help needed standing up from a chair using your arms (e.g., wheelchair or bedside chair)?: A Little Help needed to walk in hospital room?: A Little Help needed climbing 3-5 steps with a railing? : Total 6 Click Score: 15    End of Session   Activity Tolerance: Patient tolerated treatment well Patient left: in chair;with chair alarm set;with family/visitor present;with call bell/phone within reach   PT Visit Diagnosis: Unsteadiness on feet (R26.81);Muscle weakness (generalized) (M62.81);Difficulty in walking, not elsewhere classified (R26.2)     Time: [id number] PT Time Calculation (min) (ACUTE ONLY): 27 min  Charges:  $Therapeutic Activity: 23-37 mins                     [name], PT, DPT 05/03/21, 3:06 PM    [name] 05/03/2021, 3:05 PM

## 2021-05-03 NOTE — Progress Notes (Signed)
Patient requested IV to be removed. Stated his arm was hurting bad. I flushed IV to check its status, flushed good. No signs of infiltration, swelling or redness. I educated patient on why he needed to keep an IV in place. Patient asked again for me to remove IV, and to take a break from IV and he wanted a different nurse to place new IV.

## 2021-05-03 NOTE — Progress Notes (Signed)
PROGRESS NOTE    Ryan [name]  [id number] DOB: August 20, 1961 DOA: 03/30/2021 PCP: Merryl Hacker, No    Brief Narrative:  Ryan [name] is a 60 y.o. male with medical history significant for history of polysubstance abuse including THC, cocaine, alcohol, remote history of tobacco use, history of right proximal femur inter trochanteric fracture status post right IM nail intertrochanteric fixation in 04/02/2021, does not follow-up with a PCP, presents to the emergency department for chief concerns of right hip pain.  Pain is chronic for several months, denies any trauma, difficulty ambulation due to pain. He also endorses an unintentional 35 pound weight loss in the last year, denies bright red blood per rectum and melena. patient was found to have L5 compression fracture, L3/4 severe stenosis.  She was also found to have widespread bony metastasis  from prostate cancer. Patient is a followed by oncology.  He needed SNF placement, not able to find accepting facility. He is intermittently refusing some meds.  He Was febrile over the weekend and his blood cultures show 1/4 bottles GPC, possibly contaminant. He has frequent urination and occasional dry cough. CXR unremarkable abut UA is positive for nitrites, rare bacteria. Urine cultures and repeat Blood cultures ordered, if they are negative will discontinue antibiotics in the next 24 to 48 hours.   Pt seen and examined at bedside, no new complaints.   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 Neurosurgery consulted recommended not a surgical candidate Pain control. Recommend outpatient follow up with oncology for treatment. Pain well controlled     Severe symptomatic anemia with folic acid deficiency 123456 Hb 7.0, transfused 1 unit of PRBC 7/24 Hb 8.1 stable  Monitor H&H Transfuse to keep hemoglobin greater than 7.   Hypomagnesemia, mag repleted.  Monitor and replete as needed.   Hypotonic hyponatremia: Serum osmolarity 264, continue fluid restriction 1.5 L/day 7/24 Na 125  asymptomatic 7/22 started Nacl 2 g po BID x 3 days and 7/24 increased Nacl 2 g po tid for 2 days Follow repeat serum osmolarity, urine osmolarity and urine electrolytes Plan: Continue fluid restriction and sodium replacement     Hypokalemia, resolved non anion gap metabolic acidosis, [id number] started po bicarb 650 TID x 4 days       Cancer cachexia with severe protein calorie malnutrition Dietary on board recommend encouraging oral intake.   Left heel pain: X rays of the foot ordered to evaluate for pathological fractures. Negative. Increased oxycodone 1 to 2 pill every 6 hours prn.   Hypotension: Asymptomatic, keep MAP greater than 65, on Midodrine 10 mg po TID Stable on Midodrine     Fever: Blood cultures ordered, get CXR and UA. Was febrile over the weekend and his blood cultures show 1/4 bottles GPC staph epidermidis possibly contaminant. He has frequent urination and occasional dry cough. CXR unremarkable abut UA is positive for nitrites, rare bacteria. Urine cultures and repeat Blood cultures ordered, if they are negative will discontinue  antibiotics in the next 24 to 48 hours. Urine cultures showing providentia >1,00,000, sensitive to ceftriaxone and Bactrim.  S/p ceftriaxone x2 days, changed to Bactrim for 5 days, total 7 days Abx   DVT prophylaxis: SQ Lovenox Code Status:  Full Family Communication: None today  disposition Plan: Status is: Inpatient  Remains inpatient appropriate because:Inpatient level of care appropriate due to severity of illness  Dispo: The patient is from: Home              Anticipated d/c is to:  TBD              Patient currently is medically stable to d/c.   Difficult to place patient Yes       Level of care: Med-Surg  Consultants:  None  Procedures: None  Antimicrobials:  None   Subjective: Seen and examined.  No visible distress.  Objective: Vitals:   05/02/21 2158 05/03/21 0502 05/03/21 0836 05/03/21 1234  BP: 110/81 109/68 97/62 101/60  Pulse: 96 93 99 (!) 103  Resp: '18 18 18 16  '$ Temp: 98.5 F (36.9 C) 98.4 F (36.9 C) 98.7 F (37.1 C) 98.8 F (37.1 C)  TempSrc:  Oral Oral   SpO2: 96% 100% 97% 100%  Weight:      Height:        Intake/Output Summary (Last 24 hours) at 05/03/2021 1334 Last data filed at 05/03/2021 1029 Gross per 24 hour  Intake --  Output 250 ml  Net -250 ml   Filed Weights   04/01/21 1232 04/08/21 0800 04/14/21 1246  Weight: 54.7 kg 41.6 kg 51.5 kg    Examination:  General exam: No acute distress.  Appears weak and malnourished Respiratory system: Clear to auscultation. Respiratory effort normal. Cardiovascular system: S1-S2, regular rate and rhythm, no murmurs, no pedal edema gastrointestinal system: Scaphoid, nontender, nondistended, normal bowel sounds Central nervous system: Alert and oriented. No focal neurological deficits. Extremities: Diffusely decreased power bilaterally Skin: No rashes, lesions or ulcers Psychiatry: Judgement and insight appear normal. Mood & affect appropriate.     Data Reviewed: I have personally reviewed following labs and imaging studies  CBC: Recent Labs  Lab 04/29/21 0600 04/30/21 0424 05/01/21 0537 05/02/21 0426 05/03/21 0412  WBC 6.6 5.6 7.3 7.2 8.1  HGB 7.0* 8.4* 8.8* 8.1* 7.9*  HCT 21.2* 25.7* 25.5* 24.0* 23.8*  MCV 80.0  78.6* 80.7 80.3 79.1*  PLT 193 211 182 209 [id number]   Basic Metabolic Panel: Recent Labs  Lab 04/29/21 0600 04/30/21 0424 05/01/21 0537 05/02/21 0426 05/03/21 0412  NA 130* 129* 129* 125* 127*  K 4.3 4.4 4.6 4.5 4.4  CL 103 104 103 100 102  CO2 19* 18* 20* 18* 19*  GLUCOSE 97 96 88 86 116*  BUN '12 11 10 10 11  '$ CREATININE 0.50* 0.43* 0.41* 0.44* 0.53*  CALCIUM 8.1* 8.0* 8.2* 8.2* 7.8*  MG 1.6* 1.7 1.7  --   --   PHOS 3.4 3.4 3.4  --   --    GFR: Estimated Creatinine Clearance: 72.4 mL/min (A) (by C-G formula based on SCr of 0.53 mg/dL (L)). Liver Function Tests: No results for input(s): AST, ALT, ALKPHOS, BILITOT, PROT, ALBUMIN in the last 168 hours. No results for input(s): LIPASE, AMYLASE in the last 168 hours. No results for input(s): AMMONIA in the last 168 hours. Coagulation Profile: No results for input(s): INR, PROTIME in the last 168 hours. Cardiac Enzymes: No results for input(s): CKTOTAL, CKMB, CKMBINDEX, TROPONINI in the last 168  hours. BNP (last 3 results) No results for input(s): PROBNP in the last 8760 hours. HbA1C: No results for input(s): HGBA1C in the last 72 hours. CBG: No results for input(s): GLUCAP in the last 168 hours. Lipid Profile: No results for input(s): CHOL, HDL, LDLCALC, TRIG, CHOLHDL, LDLDIRECT in the last 72 hours. Thyroid Function Tests: No results for input(s): TSH, T4TOTAL, FREET4, T3FREE, THYROIDAB in the last 72 hours. Anemia Panel: No results for input(s): VITAMINB12, FOLATE, FERRITIN, TIBC, IRON, RETICCTPCT in the last 72 hours. Sepsis Labs: No results for input(s): PROCALCITON, LATICACIDVEN in the last 168 hours.  Recent Results (from the past 240 hour(s))  Culture, blood (Routine X 2) w Reflex to ID Panel     Status: None   Collection Time: 04/25/21 11:21 AM   Specimen: BLOOD  Result Value Ref Range Status   Specimen Description BLOOD BLOOD RIGHT HAND  Final   Special Requests   Final    BOTTLES DRAWN AEROBIC AND ANAEROBIC Blood  Culture adequate volume   Culture   Final    NO GROWTH 5 DAYS Performed at [facility], [address], [place], [place] [address]    Report Status 04/30/2021 FINAL  Final  Culture, blood (Routine X 2) w Reflex to ID Panel     Status: Abnormal   Collection Time: 04/25/21 11:22 AM   Specimen: BLOOD  Result Value Ref Range Status   Specimen Description   Final    BLOOD BLOOD LEFT HAND Performed at [facility], [address] [place], [place] [address]    Special Requests   Final    BOTTLES DRAWN AEROBIC AND ANAEROBIC Blood Culture adequate volume Performed at [facility], [address] [place], [place] [address]    Culture  Setup Time   Final    GRAM POSITIVE COCCI AEROBIC BOTTLE ONLY CRITICAL RESULT CALLED TO, READ BACK BY AND VERIFIED WITH: DEVIN MITVHELL 04/26/21 0956 SJL    Culture (A)  Final    STAPHYLOCOCCUS EPIDERMIDIS THE SIGNIFICANCE OF ISOLATING THIS ORGANISM FROM A SINGLE SET OF BLOOD CULTURES WHEN MULTIPLE SETS ARE DRAWN IS UNCERTAIN. PLEASE NOTIFY THE MICROBIOLOGY DEPARTMENT WITHIN ONE WEEK IF SPECIATION AND SENSITIVITIES ARE REQUIRED. Performed at [facility], [place] [address], [place], [place] [address]    Report Status 04/27/2021 FINAL  Final  Blood Culture ID Panel (Reflexed)     Status: Abnormal   Collection Time: 04/25/21 11:22 AM  Result Value Ref Range Status   Enterococcus faecalis NOT DETECTED NOT DETECTED Final   Enterococcus Faecium NOT DETECTED NOT DETECTED Final   Listeria monocytogenes NOT DETECTED NOT DETECTED Final   Staphylococcus species DETECTED (A) NOT DETECTED Final    Comment: CRITICAL RESULT CALLED TO, READ BACK BY AND VERIFIED WITH: DEVIN MITCHELL 04/26/21 0956 SJL    Staphylococcus aureus (BCID) NOT DETECTED NOT DETECTED Final   Staphylococcus epidermidis DETECTED (A) NOT DETECTED Final    Comment: CRITICAL RESULT CALLED TO, READ BACK BY AND VERIFIED WITH: DEVIN MITCHELL 04/26/21 0956 SJL     Staphylococcus lugdunensis NOT DETECTED NOT DETECTED Final   Streptococcus species NOT DETECTED NOT DETECTED Final   Streptococcus agalactiae NOT DETECTED NOT DETECTED Final   Streptococcus pneumoniae NOT DETECTED NOT DETECTED Final   Streptococcus pyogenes NOT DETECTED NOT DETECTED Final   A.calcoaceticus-baumannii NOT DETECTED NOT DETECTED Final   Bacteroides fragilis NOT DETECTED NOT DETECTED Final   Enterobacterales NOT DETECTED NOT DETECTED Final   Enterobacter cloacae complex NOT DETECTED NOT  DETECTED Final   Escherichia coli NOT DETECTED NOT DETECTED Final   Klebsiella aerogenes NOT DETECTED NOT DETECTED Final   Klebsiella oxytoca NOT DETECTED NOT DETECTED Final   Klebsiella pneumoniae NOT DETECTED NOT DETECTED Final   Proteus species NOT DETECTED NOT DETECTED Final   Salmonella species NOT DETECTED NOT DETECTED Final   Serratia marcescens NOT DETECTED NOT DETECTED Final   Haemophilus influenzae NOT DETECTED NOT DETECTED Final   Neisseria meningitidis NOT DETECTED NOT DETECTED Final   Pseudomonas aeruginosa NOT DETECTED NOT DETECTED Final   Stenotrophomonas maltophilia NOT DETECTED NOT DETECTED Final   Candida albicans NOT DETECTED NOT DETECTED Final   Candida auris NOT DETECTED NOT DETECTED Final   Candida glabrata NOT DETECTED NOT DETECTED Final   Candida krusei NOT DETECTED NOT DETECTED Final   Candida parapsilosis NOT DETECTED NOT DETECTED Final   Candida tropicalis NOT DETECTED NOT DETECTED Final   Cryptococcus neoformans/gattii NOT DETECTED NOT DETECTED Final   Methicillin resistance mecA/C NOT DETECTED NOT DETECTED Final    Comment: Performed at [facility], [address] [place], [place] [address]  Urine Culture     Status: Abnormal   Collection Time: 04/25/21  8:30 PM   Specimen: Urine, Clean Catch  Result Value Ref Range Status   Specimen Description   Final    URINE, CLEAN CATCH Performed at [facility], [address] [place], [place]  [address]    Special Requests   Final    NONE Performed at [facility], [address], [place], [place] [address]    Culture >=100,000 COLONIES/mL PROVIDENCIA RETTGERI (A)  Final   Report Status 04/28/2021 FINAL  Final   Organism ID, Bacteria PROVIDENCIA RETTGERI (A)  Final      Susceptibility   Providencia rettgeri - MIC*    AMPICILLIN RESISTANT Resistant     CEFAZOLIN >=64 RESISTANT Resistant     CEFEPIME <=0.12 SENSITIVE Sensitive     CEFTRIAXONE <=0.25 SENSITIVE Sensitive     CIPROFLOXACIN <=0.25 SENSITIVE Sensitive     GENTAMICIN <=1 SENSITIVE Sensitive     IMIPENEM 2 SENSITIVE Sensitive     NITROFURANTOIN 128 RESISTANT Resistant     TRIMETH/SULFA <=20 SENSITIVE Sensitive     AMPICILLIN/SULBACTAM <=2 SENSITIVE Sensitive     PIP/TAZO <=4 SENSITIVE Sensitive     * >=100,000 COLONIES/mL PROVIDENCIA RETTGERI  CULTURE, BLOOD (ROUTINE X 2) w Reflex to ID Panel     Status: None   Collection Time: 04/27/21  4:53 AM   Specimen: BLOOD  Result Value Ref Range Status   Specimen Description BLOOD LEFT FA  Final   Special Requests   Final    BOTTLES DRAWN AEROBIC AND ANAEROBIC Blood Culture adequate volume   Culture   Final    NO GROWTH 6 DAYS Performed at [facility], [address] [place], [place] [address]    Report Status 05/03/2021 FINAL  Final  CULTURE, BLOOD (ROUTINE X 2) w Reflex to ID Panel     Status: None   Collection Time: 04/27/21  4:53 AM   Specimen: BLOOD LEFT HAND  Result Value Ref Range Status   Specimen Description BLOOD LEFT HAND  Final   Special Requests   Final    BOTTLES DRAWN AEROBIC AND ANAEROBIC Blood Culture adequate volume   Culture   Final    NO GROWTH 6 DAYS Performed at [facility], [address] [place], [place] [address]    Report Status 05/03/2021 FINAL  Final         Radiology Studies: No results found.      Scheduled Meds:  enoxaparin (LOVENOX) injection  40 mg Subcutaneous Q24H   feeding  supplement  237 mL Oral BID BM   ferrous sulfate  325 mg Oral Q breakfast   folic acid  1 mg Oral Daily   megestrol  400 mg Oral Daily   melatonin  5 mg Oral QHS   midodrine  10 mg Oral TID WC   mirtazapine  15 mg Oral QHS   multivitamin with minerals  1 tablet Oral Daily   polyethylene glycol  17 g Oral Daily   sodium bicarbonate  650 mg Oral TID   sodium chloride  2 g Oral TID WC   Vitamin D (Ergocalciferol)  50,000 Units Oral Q7 days   Continuous Infusions:   LOS: 34 days    Time spent: 25 minutes    Sidney Ace, MD [facility] Pager [phone]  If 7PM-7AM, please contact night-coverage 05/03/2021, 1:34 PM

## 2021-05-03 NOTE — Progress Notes (Signed)
Pt refusing a new IV access, states that he will like to have it done "tomorrow," pt education provided.

## 2021-05-04 ENCOUNTER — Inpatient Hospital Stay: Payer: MEDICAID

## 2021-05-04 ENCOUNTER — Inpatient Hospital Stay: Payer: MEDICAID | Admitting: Oncology

## 2021-05-04 LAB — URINALYSIS, COMPLETE (UACMP) WITH MICROSCOPIC
Bacteria, UA: NONE SEEN
Bilirubin Urine: NEGATIVE
Glucose, UA: NEGATIVE mg/dL
Hgb urine dipstick: NEGATIVE
Ketones, ur: NEGATIVE mg/dL
Leukocytes,Ua: NEGATIVE
Nitrite: NEGATIVE
Protein, ur: NEGATIVE mg/dL
Specific Gravity, Urine: 1.014 (ref 1.005–1.030)
pH: 9 — ABNORMAL HIGH (ref 5.0–8.0)

## 2021-05-04 LAB — NA AND K (SODIUM & POTASSIUM), RAND UR
Potassium Urine: 49 mmol/L
Sodium, Ur: 66 mmol/L

## 2021-05-04 LAB — CREATININE, URINE, RANDOM: Creatinine, Urine: 74 mg/dL

## 2021-05-04 LAB — OSMOLALITY, URINE: Osmolality, Ur: 462 mOsm/kg (ref 300–900)

## 2021-05-04 MED ORDER — SODIUM CHLORIDE 0.9 % IV SOLN: INTRAVENOUS | Status: DC

## 2021-05-04 MED ORDER — FLUDROCORTISONE ACETATE 0.1 MG PO TABS
0.1000 mg | ORAL_TABLET | Freq: Every day | ORAL | Status: DC
  Administered 2021-05-04: 19:00:00 0.1 mg via ORAL
  Filled 2021-05-04 (×3): qty 1

## 2021-05-04 MED ORDER — ALBUMIN HUMAN 25 % IV SOLN
25.0000 g | Freq: Once | INTRAVENOUS | Status: AC
  Administered 2021-05-04: 22:00:00 25 g via INTRAVENOUS
  Filled 2021-05-04: qty 100

## 2021-05-04 MED ORDER — TRAZODONE HCL 50 MG PO TABS
25.0000 mg | ORAL_TABLET | Freq: Every evening | ORAL | Status: DC | PRN
  Administered 2021-05-06 – 2021-06-01 (×21): 25 mg via ORAL
  Filled 2021-05-04 (×21): qty 1

## 2021-05-04 NOTE — Progress Notes (Signed)
Nutrition Follow-up  DOCUMENTATION CODES:  Severe malnutrition in context of chronic illness, Underweight  INTERVENTION:  Encourage PO intake, continue current diet as ordered Continue double protein portions with meals. Continue Ensure Enlive BID, each supplement provides 350 kcal and 20 grams of protein . Continue snacks TID Request new measured weight  Pt would benefit from feeding tube placement and nocturnal feeds if agreeable.  NUTRITION DIAGNOSIS:  Severe Malnutrition (in the context of chronic illness) related to poor appetite as evidenced by severe muscle depletion, severe fat depletion.  - ongoing  GOAL:  Patient will meet greater than or equal to 90% of their needs  - not meeting  MONITOR:  PO intake, Weight trends  REASON FOR ASSESSMENT:  Consult Assessment of nutrition requirement/status  ASSESSMENT:  60 y.o. male presented to ED for intermittent right lateral hip pain that is been ongoing for the past several weeks. Pt had surgery 10 months ago but reports he has not followed-up for pain. Imaging in ED concerning for marrow infiltrative process. PMH relevant for polysubstance abuse.  Found to have prostate cancer with widespread bony metastasis. Pt a difficult placement so cancer treatment was initiated inpatient. Receiving Zometea and Firmagon. First treatement was 04/02/21.  Pt appears to be routinely refusing many of his medications including his MVI x 1 week. Intermittently refusing his ensure enlive. Modest improvement in meal intake since last assessment. Discussed intake with RN who confirms that intake varies depending on pt's mood and compliancy that day. Pt is not meeting his nutrition needs and is severely malnourished. Refusing to increase oral intake despite interventions. Suspect that the only way pt with achieve weight gain and nutritional stability is through a feeding tube which he would most likely not be agreeable to.  Average Meal  Intake: 6/26-6/28: ~32% x 4 recorded meals (0-100%) 6/29-7/6: 48% intake x 6 recorded meals (15-90%) 7/7-7/12: 50% intake x 5 recorded meals (0-100%) 7/13-7/19: 30% intake x 2 recorded meals (30%) 7/20-7/26: 63% intake x 4 recorded meals (50-100%)  Nutritionally Relevant Medications: Scheduled Meds:  degarelix  240 mg Subcutaneous Once   feeding supplement  237 mL Oral BID BM   ferrous sulfate  325 mg Oral Q breakfast   folic acid  1 mg Oral Daily   mirtazapine  15 mg Oral QHS   multivitamin with minerals  1 tablet Oral Daily   polyethylene glycol  17 g Oral Daily   sodium bicarbonate  650 mg Oral TID   sodium chloride  2 g Oral TID WC   Vitamin D (Ergocalciferol)  50,000 Units Oral Q7 days   Continuous Infusions:  zoledronic acid (ZOMETA) IV     PRN Meds: bisacodyl, ondansetron  Labs Reviewed  Diet Order:   Diet Order             Diet regular Room service appropriate? Yes; Fluid consistency: Thin; Fluid restriction: 1500 mL Fluid  Diet effective now                  EDUCATION NEEDS:  Education needs have been addressed  Skin:  Skin Assessment: Reviewed RN Assessment  Last BM:  7/25, type 6  Height:  Ht Readings from Last 1 Encounters:  03/30/21 '6\' 5"'$  (1.956 m)   Weight:  Wt Readings from Last 1 Encounters:  04/14/21 51.5 kg   Ideal Body Weight:  94.5 kg  BMI:  Body mass index is 13.46 kg/m.  Estimated Nutritional Needs:  Kcal:  2400-2600 kcal/d Protein:  130-150 g/d  Fluid:  >2.5L/d  Ranell Patrick, RD, LDN Clinical Dietitian Pager on [place]

## 2021-05-04 NOTE — Progress Notes (Signed)
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 TLSO recommended from nurosurgery and devliered to room.   OT comments  Ryan [name] continues to present with severe weakness and limited endurance, although he denies pain today. He requires ongoing encouragement to engage, stating repeatedly that he cannot get good sleep here at the hospital, making him too tired for OOB activity. He needed more assistance than previously for bed mobility; in particular, when transitioning from sit to supine, he was unable to [facility] raise his legs into bed and required Mod A for scooting towards HOB in supine. Ryan [name] wanted to engage in life review conversation and requested therapist pray with him. Will continue to offer OT while patient is in hospital, with SNF for DC, given pt's limited ability to perform ADL and fxl mobility tasks.   Follow Up Recommendations  SNF    Equipment Recommendations  None recommended by OT    Recommendations for Other Services      Precautions / Restrictions Precautions Precautions: Fall Required Braces or Orthoses: Spinal Brace Spinal Brace: Thoracolumbosacral orthotic Restrictions Weight Bearing Restrictions: No Other Position/Activity Restrictions: LLE post op shoe       Mobility Bed Mobility Overal bed mobility: Needs Assistance Bed Mobility: Rolling;Supine to Sit;Sit to Supine Rolling: Supervision   Supine to sit: Min assist Sit to supine: Mod assist   General bed mobility comments: Requires  ModA lifting R LE into bed on sit<supine    Transfers Overall transfer level: Needs assistance Equipment used: Rolling walker (2 wheeled) Transfers: Sit to/from Stand Sit to Stand: Min assist        Lateral/Scoot Transfers: Min assist General transfer comment: Pt declined donning off-loading boot this session, reporting no pain in L LE at present    Balance Overall balance assessment: Needs assistance Sitting-balance support: Feet supported;Bilateral upper extremity supported Sitting balance-Leahy Scale: Good     Standing balance support: Bilateral upper extremity supported;During functional activity Standing balance-Leahy Scale: Fair Standing balance comment: requires UE support on RW                           ADL either performed or assessed with clinical judgement   ADL Overall ADL's : Needs assistance/impaired                     Lower Body Dressing: Moderate assistance Lower Body Dressing Details (indicate cue type and reason): Requires assist for threading underwear onto LE, pulling up past knees.               General ADL Comments: Reluctant to participate in OOB activity. Requires Min-Mod A for ADL mgmt     Vision Patient Visual Report: No change from baseline     Perception     Praxis      Cognition Arousal/Alertness: Lethargic Behavior During Therapy: WFL for tasks assessed/performed;Flat affect Overall Cognitive Status: Within Functional Limits for tasks assessed  General Comments: Requires ongoing encouragement to participate in session        Exercises Other Exercises Other Exercises: Bed mobility, transfers, LB dressing, lateral scoot, sitting balance tolerance, discussion re: goals of care, therapeutic listening   Shoulder Instructions       General Comments      Pertinent Vitals/ Pain       Faces Pain Scale: Hurts little more Pain Location: generalized Pain  Descriptors / Indicators: Discomfort;Grimacing  Home Living                                          Prior Functioning/Environment              Frequency  Min 1X/week        Progress Toward Goals  OT Goals(current goals can now be found in the care plan section)  Progress towards OT goals: Progressing toward goals  Acute Rehab OT Goals Patient Stated Goal: to get stronger OT Goal Formulation: With patient Time For Goal Achievement: 05/11/21  Plan Discharge plan remains appropriate;Frequency remains appropriate    Co-evaluation                 AM-PAC OT "6 Clicks" Daily Activity     Outcome Measure   Help from another person eating meals?: A Little Help from another person taking care of personal grooming?: None Help from another person toileting, which includes using toliet, bedpan, or urinal?: A Little Help from another person bathing (including washing, rinsing, drying)?: A Little Help from another person to put on and taking off regular upper body clothing?: A Little Help from another person to put on and taking off regular lower body clothing?: A Little 6 Click Score: 19    End of Session Equipment Utilized During Treatment: Rolling walker  OT Visit Diagnosis: Unsteadiness on feet (R26.81);Muscle weakness (generalized) (M62.81)   Activity Tolerance Patient limited by fatigue   Patient Left in bed;with call bell/phone within reach;with bed alarm set   Nurse Communication          Time: [id number] OT Time Calculation (min): 41 min  Charges: OT General Charges $OT Visit: 1 Visit OT Treatments $Self Care/Home Management : 23-37 mins $Therapeutic Activity: 8-22 mins  Josiah Lobo, PhD, MS, OTR/L 05/04/21, 3:05 PM

## 2021-05-04 NOTE — Progress Notes (Signed)
After delaying Zometa and Firmagon yesterday, I was able to administer Zometa infusion today, however patient still delayed Firmagon injection, and finally refused it this afternoon. I notified Oncology and Attending.

## 2021-05-04 NOTE — Progress Notes (Signed)
MD aware, see new orders   05/04/21 1622  Assess: MEWS Score  Temp 98.4 F (36.9 C)  BP (!) 82/52  Pulse Rate (!) 106  Resp 16  SpO2 100 %  O2 Device Room Air  Assess: MEWS Score  MEWS Temp 0  MEWS Systolic 1  MEWS Pulse 1  MEWS RR 0  MEWS LOC 0  MEWS Score 2  MEWS Score Color Yellow  Assess: if the MEWS score is Yellow or Red  Were vital signs taken at a resting state? Yes  Focused Assessment No change from prior assessment  Does the patient meet 2 or more of the SIRS criteria? No  Does the patient have a confirmed or suspected source of infection? No  MEWS guidelines implemented *See Row Information* No, previously yellow, continue vital signs every 4 hours  Treat  MEWS Interventions Other (Comment) (MD aware see oders)  Pain Scale 0-10  Take Vital Signs  Increase Vital Sign Frequency  Yellow: Q 2hr X 2 then Q 4hr X 2, if remains yellow, continue Q 4hrs  Escalate  MEWS: Escalate Yellow: discuss with charge nurse/RN and consider discussing with provider and RRT (discuss with MD)  Notify: Charge Nurse/RN  Name of Charge Nurse/RN Notified Estill Bamberg  Date Charge Nurse/RN Notified 05/04/21  Time Charge Nurse/RN Notified 1639  Notify: Provider  Provider Name/Title Sreenath  Date Provider Notified 05/04/21  Time Provider Notified 1639  Notification Type  (secure message)  Notification Reason  (MEWS)  Date of Provider Response 05/04/21  Time of Provider Response 1639  Document  Patient Outcome Other (Comment) (continue to monitor)  Progress note created (see row info) Yes  Assess: SIRS CRITERIA  SIRS Temperature  0  SIRS Pulse 1  SIRS Respirations  0  SIRS WBC 0  SIRS Score Sum  1

## 2021-05-04 NOTE — TOC Progression Note (Signed)
Transition of Care [facility]) - Progression Note    Patient Details  Name: Ryan [name] MRN: [id number] Date of Birth: December 24, 1960  Transition of Care [facility]) CM/SW [place], RN Phone Number: 05/04/2021, 2:53 PM  Clinical Narrative:   Message left for Amy Shepherd re: Medicaid status.  Re sent bed requests.  APS searching for placement.  According to patient's cousin and aunts, there are no family members that can take him in at this time.  Aunts are in their 15s and his cousin lives in [place].  TOC to follow to discharge.         Expected Discharge Plan and Services                                                 Social Determinants of Health (SDOH) Interventions    Readmission Risk Interventions Readmission Risk Prevention Plan 04/23/2021  Transportation Screening Complete  PCP or Specialist Appt within 3-5 Days Complete  HRI or [place] Complete  Social Work Consult for [place] Planning/Counseling Not Complete  SW consult not completed comments RNCM assigned to patient  Palliative Care Screening Complete  Medication Review [employer]) Complete  Some recent data might be hidden

## 2021-05-04 NOTE — Progress Notes (Signed)
PROGRESS NOTE    Ryan [name]  [id number] DOB: 05-04-1961 DOA: 03/30/2021 PCP: Merryl Hacker, No    Brief Narrative:  Ryan [name] is a 60 y.o. male with medical history significant for history of polysubstance abuse including THC, cocaine, alcohol, remote history of tobacco use, history of right proximal femur inter trochanteric fracture status post right IM nail intertrochanteric fixation in 04/02/2021, does not follow-up with a PCP, presents to the emergency department for chief concerns of right hip pain.  Pain is chronic for several months, denies any trauma, difficulty ambulation due to pain. He also endorses an unintentional 35 pound weight loss in the last year, denies bright red blood per rectum and melena. patient was found to have L5 compression fracture, L3/4 severe stenosis.  She was also found to have widespread bony metastasis  from prostate cancer. Patient is a followed by oncology.  He needed SNF placement, not able to find accepting facility. He is intermittently refusing some meds.  He Was febrile over the weekend and his blood cultures show 1/4 bottles GPC, possibly contaminant. He has frequent urination and occasional dry cough. CXR unremarkable abut UA is positive for nitrites, rare bacteria. Urine cultures and repeat Blood cultures ordered, if they are negative will discontinue antibiotics in the next 24 to 48 hours.   Pt seen and examined at bedside, no new complaints.  Oral intake is remained poor.  Case discussed with registered dietitian.  Patient not meeting his nutritional goals and has been intermittently refusing medications and multivitamins.  Also intermittently refusing supplemental shakes.  In order to meet nutritional goals patient will likely need a PEG tube.  We will continue to encourage patient to eat and discuss possibility of enteral feeding.   Assessment & Plan:   Principal Problem:   Symptomatic anemia Active Problems:   Hyponatremia   Closed rib fracture    Acute anemia   Thrombocytopenia (HCC)   Thoracic compression fracture, closed, initial encounter [facility])   Pathologic fracture of thoracic vertebrae, initial encounter   Malnourished ([place])   Protein-calorie malnutrition, severe (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 Neurosurgery consulted recommended not a surgical candidate Plan: Oncology following for inpatient cancer related needs.    Severe symptomatic anemia with folic acid deficiency 123456 Hb 7.0, transfused 1 unit of PRBC 7/24 Hb 8.1 stable  Monitor H&H Transfuse to keep hemoglobin greater than 7.   Hypomagnesemia, mag repleted.  Monitor and replete as needed.   Hypotonic hyponatremia: Serum osmolarity 264, continue fluid restriction 1.5 L/day 7/24 Na 125  asymptomatic 7/22 started Nacl 2 g po BID x 3 days and 7/24 increased Nacl 2 g po tid for 2 days Follow repeat serum osmolarity, urine osmolarity and urine electrolytes Plan: Continue fluid restriction and sodium replacement     Hypokalemia, resolved non anion gap metabolic acidosis, [id number] started po bicarb 650 TID x 4 days       Cancer cachexia with severe protein calorie malnutrition Dietary on board recommend encouraging oral intake. Patient has been intermittently refusing multivitamins and protein supplements.  He is not meeting his nutritional goals.  Will discuss further with him but he most likely will need placement of enteral feeding tube in order to improve his nutritional status.   Left heel pain: X rays of  the foot ordered to evaluate for pathological fractures. Negative. Increased oxycodone 1 to 2 pill every 6 hours prn.   Hypotension: Asymptomatic, keep MAP greater than 65, on Midodrine 10 mg po TID Stable on Midodrine      Fever: Blood cultures ordered, get CXR and UA. Was febrile over the weekend and his blood cultures show 1/4 bottles GPC staph epidermidis possibly contaminant. He has frequent urination and occasional dry cough. CXR unremarkable abut UA is positive for nitrites, rare bacteria. Urine cultures and repeat Blood cultures ordered, if they are negative will discontinue antibiotics in the next 24 to 48 hours. Urine cultures showing providentia >1,00,000, sensitive to ceftriaxone and Bactrim.  S/p ceftriaxone x2 days, changed to Bactrim for 5 days, total 7 days Abx   DVT prophylaxis: SQ Lovenox Code Status: Full Family Communication: [name] [phone] on 7/26 disposition Plan: Status is: Inpatient  Remains inpatient appropriate because:Inpatient level of care appropriate due to severity of illness  Dispo: The patient is from: Home              Anticipated d/c is to:  TBD              Patient currently is medically stable to d/c.   Difficult to place patient Yes  Difficult placement.  35 days in hospital.  Unclear discharge plan at this point.  Patient nutritional status remains very poor.     Level of care: Med-Surg  Consultants:  None  Procedures: None  Antimicrobials:  None   Subjective: Seen and examined.  Stated he slept well last night.  No pain complaints this morning.  Objective: Vitals:   05/03/21 1547 05/03/21 2001 05/04/21 0412 05/04/21 0842  BP: (!) 86/69 110/68 107/71 99/62  Pulse: 99 (!) 102 (!) 103 (!) 113  Resp: '17 16 18 18  '$ Temp: 97.7 F (36.5 C) 98.5 F (36.9 C) 98 F (36.7 C) 98.2 F (36.8 C)  TempSrc: Oral     SpO2: 100% 100% 100% 97%  Weight:      Height:        Intake/Output Summary (Last 24 hours) at 05/04/2021 1119 Last data filed at 05/04/2021 0405 Gross per 24 hour  Intake --  Output 475 ml  Net -475 ml   Filed Weights   04/01/21 1232 04/08/21 0800 04/14/21 1246  Weight: 54.7 kg 41.6 kg 51.5 kg     Examination:  General exam: No acute distress.  Cachectic Respiratory system: Clear to auscultation. Respiratory effort normal. Cardiovascular system: S1-S2, regular rate and rhythm, no murmurs, no pedal edema gastrointestinal system: Scaphoid, nontender, nondistended, hyperactive bowel sounds Central nervous system: Alert and oriented. No focal neurological deficits. Extremities: Diffusely decreased power bilaterally Skin: No rashes, lesions or ulcers Psychiatry: Judgement and insight appear normal. Mood & affect appropriate.     Data Reviewed: I have personally reviewed following labs and imaging studies  CBC: Recent Labs  Lab 04/29/21 0600 04/30/21 0424 05/01/21 0537 05/02/21 0426 05/03/21 0412  WBC 6.6 5.6 7.3 7.2 8.1  HGB 7.0* 8.4* 8.8* 8.1* 7.9*  HCT 21.2* 25.7* 25.5* 24.0* 23.8*  MCV 80.0 78.6* 80.7 80.3 79.1*  PLT 193 211 182 209 [id number]   Basic Metabolic Panel: Recent Labs  Lab 04/29/21 0600 04/30/21 0424 05/01/21 0537 05/02/21 0426 05/03/21 0412  NA 130* 129* 129* 125* 127*  K 4.3 4.4 4.6 4.5 4.4  CL 103 104 103 100 102  CO2 19* 18* 20* 18* 19*  GLUCOSE 97 96 88 86  116*  BUN '12 11 10 10 11  '$ CREATININE 0.50* 0.43* 0.41* 0.44* 0.53*  CALCIUM 8.1* 8.0* 8.2* 8.2* 7.8*  MG 1.6* 1.7 1.7  --   --   PHOS 3.4 3.4 3.4  --   --    GFR: Estimated Creatinine Clearance: 72.4 mL/min (A) (by C-G formula based on SCr of 0.53 mg/dL (L)). Liver Function Tests: No results for input(s): AST, ALT, ALKPHOS, BILITOT, PROT, ALBUMIN in the last 168 hours. No results for input(s): LIPASE, AMYLASE in the last 168 hours. No results for input(s): AMMONIA in the last 168 hours. Coagulation Profile: No results for input(s): INR, PROTIME in the last 168 hours. Cardiac Enzymes: No results for input(s): CKTOTAL, CKMB, CKMBINDEX, TROPONINI in the last 168 hours. BNP (last 3 results) No results for input(s): PROBNP in the last 8760 hours. HbA1C: No results for input(s): HGBA1C in  the last 72 hours. CBG: No results for input(s): GLUCAP in the last 168 hours. Lipid Profile: No results for input(s): CHOL, HDL, LDLCALC, TRIG, CHOLHDL, LDLDIRECT in the last 72 hours. Thyroid Function Tests: No results for input(s): TSH, T4TOTAL, FREET4, T3FREE, THYROIDAB in the last 72 hours. Anemia Panel: No results for input(s): VITAMINB12, FOLATE, FERRITIN, TIBC, IRON, RETICCTPCT in the last 72 hours. Sepsis Labs: No results for input(s): PROCALCITON, LATICACIDVEN in the last 168 hours.  Recent Results (from the past 240 hour(s))  Culture, blood (Routine X 2) w Reflex to ID Panel     Status: None   Collection Time: 04/25/21 11:21 AM   Specimen: BLOOD  Result Value Ref Range Status   Specimen Description BLOOD BLOOD RIGHT HAND  Final   Special Requests   Final    BOTTLES DRAWN AEROBIC AND ANAEROBIC Blood Culture adequate volume   Culture   Final    NO GROWTH 5 DAYS Performed at [facility], [address] [place], [place] [address]    Report Status 04/30/2021 FINAL  Final  Culture, blood (Routine X 2) w Reflex to ID Panel     Status: Abnormal   Collection Time: 04/25/21 11:22 AM   Specimen: BLOOD  Result Value Ref Range Status   Specimen Description   Final    BLOOD BLOOD LEFT HAND Performed at [facility], [address], [place], [place] [address]    Special Requests   Final    BOTTLES DRAWN AEROBIC AND ANAEROBIC Blood Culture adequate volume Performed at [facility], [address] [place], [place] [address]    Culture  Setup Time   Final    GRAM POSITIVE COCCI AEROBIC BOTTLE ONLY CRITICAL RESULT CALLED TO, READ BACK BY AND VERIFIED WITH: DEVIN MITVHELL 04/26/21 0956 SJL    Culture (A)  Final    STAPHYLOCOCCUS EPIDERMIDIS THE SIGNIFICANCE OF ISOLATING THIS ORGANISM FROM A SINGLE SET OF BLOOD CULTURES WHEN MULTIPLE SETS ARE DRAWN IS UNCERTAIN. PLEASE NOTIFY THE MICROBIOLOGY DEPARTMENT WITHIN ONE WEEK IF SPECIATION AND SENSITIVITIES  ARE REQUIRED. Performed at [facility], [place] [address] [place], [place] [address]    Report Status 04/27/2021 FINAL  Final  Blood Culture ID Panel (Reflexed)     Status: Abnormal   Collection Time: 04/25/21 11:22 AM  Result Value Ref Range Status   Enterococcus faecalis NOT DETECTED NOT DETECTED Final   Enterococcus Faecium NOT DETECTED NOT DETECTED Final   Listeria monocytogenes NOT DETECTED NOT DETECTED Final   Staphylococcus species DETECTED (A) NOT DETECTED Final    Comment: CRITICAL RESULT CALLED TO,  READ BACK BY AND VERIFIED WITH: DEVIN MITCHELL 04/26/21 0956 SJL    Staphylococcus aureus (BCID) NOT DETECTED NOT DETECTED Final   Staphylococcus epidermidis DETECTED (A) NOT DETECTED Final    Comment: CRITICAL RESULT CALLED TO, READ BACK BY AND VERIFIED WITH: DEVIN MITCHELL 04/26/21 0956 SJL    Staphylococcus lugdunensis NOT DETECTED NOT DETECTED Final   Streptococcus species NOT DETECTED NOT DETECTED Final   Streptococcus agalactiae NOT DETECTED NOT DETECTED Final   Streptococcus pneumoniae NOT DETECTED NOT DETECTED Final   Streptococcus pyogenes NOT DETECTED NOT DETECTED Final   A.calcoaceticus-baumannii NOT DETECTED NOT DETECTED Final   Bacteroides fragilis NOT DETECTED NOT DETECTED Final   Enterobacterales NOT DETECTED NOT DETECTED Final   Enterobacter cloacae complex NOT DETECTED NOT DETECTED Final   Escherichia coli NOT DETECTED NOT DETECTED Final   Klebsiella aerogenes NOT DETECTED NOT DETECTED Final   Klebsiella oxytoca NOT DETECTED NOT DETECTED Final   Klebsiella pneumoniae NOT DETECTED NOT DETECTED Final   Proteus species NOT DETECTED NOT DETECTED Final   Salmonella species NOT DETECTED NOT DETECTED Final   Serratia marcescens NOT DETECTED NOT DETECTED Final   Haemophilus influenzae NOT DETECTED NOT DETECTED Final   Neisseria meningitidis NOT DETECTED NOT DETECTED Final   Pseudomonas aeruginosa NOT DETECTED NOT DETECTED Final   Stenotrophomonas maltophilia  NOT DETECTED NOT DETECTED Final   Candida albicans NOT DETECTED NOT DETECTED Final   Candida auris NOT DETECTED NOT DETECTED Final   Candida glabrata NOT DETECTED NOT DETECTED Final   Candida krusei NOT DETECTED NOT DETECTED Final   Candida parapsilosis NOT DETECTED NOT DETECTED Final   Candida tropicalis NOT DETECTED NOT DETECTED Final   Cryptococcus neoformans/gattii NOT DETECTED NOT DETECTED Final   Methicillin resistance mecA/C NOT DETECTED NOT DETECTED Final    Comment: Performed at [facility], [address], [place], [place] [address]  Urine Culture     Status: Abnormal   Collection Time: 04/25/21  8:30 PM   Specimen: Urine, Clean Catch  Result Value Ref Range Status   Specimen Description   Final    URINE, CLEAN CATCH Performed at [facility], [address] [place], [place] [address]    Special Requests   Final    NONE Performed at [facility], [address] [place], [place] [address]    Culture >=100,000 COLONIES/mL PROVIDENCIA RETTGERI (A)  Final   Report Status 04/28/2021 FINAL  Final   Organism ID, Bacteria PROVIDENCIA RETTGERI (A)  Final      Susceptibility   Providencia rettgeri - MIC*    AMPICILLIN RESISTANT Resistant     CEFAZOLIN >=64 RESISTANT Resistant     CEFEPIME <=0.12 SENSITIVE Sensitive     CEFTRIAXONE <=0.25 SENSITIVE Sensitive     CIPROFLOXACIN <=0.25 SENSITIVE Sensitive     GENTAMICIN <=1 SENSITIVE Sensitive     IMIPENEM 2 SENSITIVE Sensitive     NITROFURANTOIN 128 RESISTANT Resistant     TRIMETH/SULFA <=20 SENSITIVE Sensitive     AMPICILLIN/SULBACTAM <=2 SENSITIVE Sensitive     PIP/TAZO <=4 SENSITIVE Sensitive     * >=100,000 COLONIES/mL PROVIDENCIA RETTGERI  CULTURE, BLOOD (ROUTINE X 2) w Reflex to ID Panel     Status: None   Collection Time: 04/27/21  4:53 AM   Specimen: BLOOD  Result Value Ref Range Status   Specimen Description BLOOD LEFT FA  Final   Special Requests   Final    BOTTLES DRAWN AEROBIC AND  ANAEROBIC Blood Culture adequate volume  Culture   Final    NO GROWTH 6 DAYS Performed at [facility], [address] [place], [place] [address]    Report Status 05/03/2021 FINAL  Final  CULTURE, BLOOD (ROUTINE X 2) w Reflex to ID Panel     Status: None   Collection Time: 04/27/21  4:53 AM   Specimen: BLOOD LEFT HAND  Result Value Ref Range Status   Specimen Description BLOOD LEFT HAND  Final   Special Requests   Final    BOTTLES DRAWN AEROBIC AND ANAEROBIC Blood Culture adequate volume   Culture   Final    NO GROWTH 6 DAYS Performed at [facility], [address], [place], [place] [address]    Report Status 05/03/2021 FINAL  Final         Radiology Studies: No results found.      Scheduled Meds:  degarelix  240 mg Subcutaneous Once   enoxaparin (LOVENOX) injection  40 mg Subcutaneous Q24H   feeding supplement  237 mL Oral BID BM   ferrous sulfate  325 mg Oral Q breakfast   folic acid  1 mg Oral Daily   melatonin  5 mg Oral QHS   midodrine  10 mg Oral TID WC   mirtazapine  15 mg Oral QHS   polyethylene glycol  17 g Oral Daily   sodium chloride  2 g Oral TID WC   Vitamin D (Ergocalciferol)  50,000 Units Oral Q7 days   Continuous Infusions:  zoledronic acid (ZOMETA) IV       LOS: 35 days    Time spent: 25 minutes    Sidney Ace, MD [facility] Pager [phone]  If 7PM-7AM, please contact night-coverage 05/04/2021, 11:19 AM

## 2021-05-04 NOTE — Progress Notes (Signed)
Patient anxious at the time vitals were obtained. MD present at bedside.   05/04/21 0842  Assess: MEWS Score  Temp 98.2 F (36.8 C)  BP 99/62  Pulse Rate (!) 113  Resp 18  SpO2 97 %  O2 Device Room Air  Assess: MEWS Score  MEWS Temp 0  MEWS Systolic 1  MEWS Pulse 2  MEWS RR 0  MEWS LOC 0  MEWS Score 3  MEWS Score Color Yellow  Assess: if the MEWS score is Yellow or Red  Were vital signs taken at a resting state? No (anxious)  Focused Assessment No change from prior assessment  Does the patient meet 2 or more of the SIRS criteria? No  Does the patient have a confirmed or suspected source of infection? No  MEWS guidelines implemented *See Row Information* Yes  Treat  MEWS Interventions Other (Comment) (MD was present when vitals were obtained.)  Pain Scale 0-10  Pain Score 3  Pain Type Chronic pain  Take Vital Signs  Increase Vital Sign Frequency  Yellow: Q 2hr X 2 then Q 4hr X 2, if remains yellow, continue Q 4hrs  Escalate  MEWS: Escalate Yellow: discuss with charge nurse/RN and consider discussing with provider and RRT (MD present when vitals were obtained)  Notify: Charge Nurse/RN  Name of Charge Nurse/RN Notified Estill Bamberg  Date Charge Nurse/RN Notified 05/04/21  Time Charge Nurse/RN Notified 0900  Notify: Provider  Provider Name/Title Sreenath  Date Provider Notified 05/04/21  Time Provider Notified [phone]  Notification Type Face-to-face  Notification Reason  (increased HR , yellow mews)  Provider response At bedside  Date of Provider Response 05/04/21  Time of Provider Response 0845  Document  Patient Outcome Other (Comment) (patient stable, no further action)  Progress note created (see row info) Yes  Assess: SIRS CRITERIA  SIRS Temperature  0  SIRS Pulse 1  SIRS Respirations  0  SIRS WBC 0  SIRS Score Sum  1

## 2021-05-05 DIAGNOSIS — Z66 Do not resuscitate: Secondary | ICD-10-CM

## 2021-05-05 DIAGNOSIS — R634 Abnormal weight loss: Secondary | ICD-10-CM

## 2021-05-05 MED ORDER — ACETAMINOPHEN 325 MG PO TABS: 650.0000 mg | ORAL_TABLET | ORAL | Status: DC | PRN

## 2021-05-05 MED ORDER — IBUPROFEN 400 MG PO TABS
400.0000 mg | ORAL_TABLET | Freq: Four times a day (QID) | ORAL | Status: DC | PRN
  Administered 2021-05-05 – 2021-05-16 (×4): 400 mg via ORAL
  Filled 2021-05-05 (×5): qty 1

## 2021-05-05 MED ORDER — METHOCARBAMOL 500 MG PO TABS
500.0000 mg | ORAL_TABLET | Freq: Three times a day (TID) | ORAL | Status: DC | PRN
  Filled 2021-05-05: qty 1

## 2021-05-05 NOTE — Progress Notes (Signed)
   05/05/21 1200  Clinical Encounter Type  Visited With Patient  Visit Type Follow-up;Spiritual support;Social support;Psychological support  Referral From Chaplain  Consult/Referral To [employer] followed up with PT he had visited previously. PT was able to express his emotions. Chaplain encouraged PT to try to eat, take medicine, and to follow doctors' orders. Chaplain normalized PT's emotions. Chaplain ministered with prayer, presence, and active listening. Chaplain promised the PT that he would visit him again, as requested.

## 2021-05-05 NOTE — Progress Notes (Signed)
Nothing new, continue to monitor        05/05/21 0744  Assess: MEWS Score  Temp 98 F (36.7 C)  BP (!) 88/57  Pulse Rate (!) 107  Resp 18  SpO2 97 %  O2 Device Room Air  Assess: MEWS Score  MEWS Temp 0  MEWS Systolic 1  MEWS Pulse 1  MEWS RR 0  MEWS LOC 0  MEWS Score 2  MEWS Score Color Yellow  Assess: if the MEWS score is Yellow or Red  Were vital signs taken at a resting state? Yes  Focused Assessment Change from prior assessment (see assessment flowsheet)  Does the patient meet 2 or more of the SIRS criteria? No  Does the patient have a confirmed or suspected source of infection? No  MEWS guidelines implemented *See Row Information* No, previously yellow, continue vital signs every 4 hours  Notify: Charge Nurse/RN  Name of Charge Nurse/RN Notified brandi M. RN  Date Charge Nurse/RN Notified 05/05/21  Time Charge Nurse/RN Notified 0745  Document  Patient Outcome Other (Comment) (cnt to monitor)  Assess: SIRS CRITERIA  SIRS Temperature  0  SIRS Pulse 1  SIRS Respirations  0  SIRS WBC 0  SIRS Score Sum  1

## 2021-05-05 NOTE — Progress Notes (Signed)
PROGRESS NOTE    [name]  [id number] DOB: 31-Oct-1960 DOA: 03/30/2021 PCP: Merryl Hacker, No    Brief Narrative:  Ryan [name] is a 60 y.o. male with medical history significant for history of polysubstance abuse including THC, cocaine, alcohol, remote history of tobacco use, history of right proximal femur inter trochanteric fracture status post right IM nail intertrochanteric fixation in 04/02/2021, does not follow-up with a PCP, presents to the emergency department for chief concerns of right hip pain.  Pain is chronic for several months, denies any trauma, difficulty ambulation due to pain. He also endorses an unintentional 35 pound weight loss in the last year, denies bright red blood per rectum and melena. patient was found to have L5 compression fracture, L3/4 severe stenosis.  She was also found to have widespread bony metastasis  from prostate cancer. Patient is a followed by oncology.  He needed SNF placement, not able to find accepting facility. He is intermittently refusing some meds.  He Was febrile over the weekend and his blood cultures show 1/4 bottles GPC, possibly contaminant. He has frequent urination and occasional dry cough. CXR unremarkable abut UA is positive for nitrites, rare bacteria. Urine cultures and repeat Blood cultures ordered, if they are negative will discontinue antibiotics in the next 24 to 48 hours.   Pt seen and examined at bedside, no new complaints.  Oral intake is remained poor.  Case discussed with registered dietitian.  Patient not meeting his nutritional goals and has been intermittently refusing medications and multivitamins.  Also intermittently refusing supplemental shakes.  In order to meet nutritional goals patient will likely need a PEG tube.  We will continue to encourage patient to eat and discuss possibility of enteral feeding.  7/27: Palliative care reconsulted to discuss goals of care.  After lengthy discussion with the patient and patient agreed  that he was interested in continuing aggressive treatment and at this point not entertaining de-escalation of care or comfort measures.  Stressed the importance of medication adherence and oral intake.   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 Neurosurgery consulted recommended not a surgical candidate Plan: Oncology following for inpatient cancer related needs. Patient has been intermittently refusing cancer related medications after repeated discussions he agrees to take the prescribed medications    Severe symptomatic anemia with folic acid deficiency [id number] Hb 7.0, transfused 1 unit of PRBC 7/24 Hb 8.1 stable  Monitor H&H Transfuse to keep hemoglobin greater than 7.   Hypomagnesemia, mag repleted.  Monitor and replete as needed.   Hyponatremia Likely multifactorial secondary to possible SIADH versus hypovolemia Appears chronic and asymptomatic Plan: Fluid restriction and sodium tabs have been discontinued     Hypokalemia, resolved non anion gap metabolic acidosis, [id number] started po bicarb 650 TID x 4 days       Cancer cachexia with severe protein calorie malnutrition Dietary on board recommend encouraging oral intake. Patient has been intermittently refusing multivitamins and protein supplements.  Not  meeting his nutritional goals.  Has adamantly refused PEG tube placement.  Has agreed to attempt to eat more.   Remove all dietary restrictions.   Left heel pain: X rays of the foot ordered to evaluate for pathological fractures. Negative. Increased oxycodone 1 to 2 pill every 6 hours prn.   Hypotension: Asymptomatic, keep MAP greater than 65, on Midodrine 10 mg po TID Has been taking midodrine intermittently.  When he takes his blood pressure is stable     Fever: No evidence of infection.  No indication for antibiotics   DVT prophylaxis: SQ Lovenox Code Status: Full Family Communication: Ryan [name] [phone] at bedside on 7/27 disposition Plan: Status is: Inpatient  Remains inpatient appropriate because:Inpatient level of care appropriate due to severity of illness  Dispo: The patient is from: Home              Anticipated d/c is to:  TBD              Patient currently is medically stable to d/c.   Difficult to place patient Yes  Unclear disposition plan at this time.  Extended hospitalization.  Severely malnourished.  Not meeting nutritional goals.     Level of care: Med-Surg  Consultants:  None  Procedures: None  Antimicrobials:  None   Subjective: Seen and examined this morning.  Patient's aunt at bedside.  States his oral intake is improving.  Objective: Vitals:   05/05/21 0142 05/05/21 0436 05/05/21 0744 05/05/21 1219  BP: (!) 92/54 (!) 90/53 (!) 88/57 99/65  Pulse: (!) 103 (!) 103 (!) 107 (!) 104  Resp: '14 16 18 18  '$ Temp: 98.7 F (37.1 C) 99.1 F (37.3 C) 98 F (36.7 C) 98.3 F (36.8 C)  TempSrc: Oral     SpO2: 98% 96% 97% 98%  Weight:      Height:        Intake/Output Summary (Last 24 hours) at 05/05/2021 1613 Last data filed at 05/05/2021 0600 Gross per 24 hour  Intake --  Output 495 ml  Net -495 ml   Filed Weights   04/08/21 0800 04/14/21 1246 05/04/21 0846  Weight: 41.6 kg 51.5 kg 46.8 kg    Examination:  General exam: No acute distress.  Thin and frail.  Fatigued Respiratory system: Clear to auscultation. Respiratory  effort normal. Cardiovascular system: S1-S2, regular rate and rhythm, no murmurs, no pedal edema gastrointestinal system: Scaphoid, nontender, nondistended, hyperactive bowel sounds Central nervous system: Alert and oriented.  No focal deficits Extremities: Diffusely decreased power bilaterally Skin: No rashes, lesions or ulcers Psychiatry: Judgement and insight appear normal. Mood & affect appropriate.     Data Reviewed: I have personally reviewed following labs and imaging studies  CBC: Recent Labs  Lab 04/29/21 0600 04/30/21 0424 05/01/21 0537 05/02/21 0426 05/03/21 0412  WBC 6.6 5.6 7.3 7.2 8.1  HGB 7.0* 8.4* 8.8* 8.1* 7.9*  HCT 21.2* 25.7* 25.5* 24.0* 23.8*  MCV 80.0 78.6* 80.7 80.3 79.1*  PLT 193 211 182 209 [id number]   Basic Metabolic Panel: Recent Labs  Lab 04/29/21 0600 04/30/21 0424 05/01/21 0537 05/02/21 0426 05/03/21 0412  NA 130* 129* 129* 125* 127*  K 4.3 4.4 4.6 4.5 4.4  CL 103 104 103 100 102  CO2 19* 18* 20* 18* 19*  GLUCOSE 97 96 88 86 116*  BUN '12 11 10 10 11  '$ CREATININE 0.50* 0.43* 0.41* 0.44* 0.53*  CALCIUM 8.1* 8.0* 8.2* 8.2* 7.8*  MG 1.6* 1.7 1.7  --   --  PHOS 3.4 3.4 3.4  --   --    GFR: Estimated Creatinine Clearance: 65.8 mL/min (A) (by C-G formula based on SCr of 0.53 mg/dL (L)). Liver Function Tests: No results for input(s): AST, ALT, ALKPHOS, BILITOT, PROT, ALBUMIN in the last 168 hours. No results for input(s): LIPASE, AMYLASE in the last 168 hours. No results for input(s): AMMONIA in the last 168 hours. Coagulation Profile: No results for input(s): INR, PROTIME in the last 168 hours. Cardiac Enzymes: No results for input(s): CKTOTAL, CKMB, CKMBINDEX, TROPONINI in the last 168 hours. BNP (last 3 results) No results for input(s): PROBNP in the last 8760 hours. HbA1C: No results for input(s): HGBA1C in the last 72 hours. CBG: No results for input(s): GLUCAP in the last 168 hours. Lipid Profile: No results for input(s): CHOL, HDL,  LDLCALC, TRIG, CHOLHDL, LDLDIRECT in the last 72 hours. Thyroid Function Tests: No results for input(s): TSH, T4TOTAL, FREET4, T3FREE, THYROIDAB in the last 72 hours. Anemia Panel: No results for input(s): VITAMINB12, FOLATE, FERRITIN, TIBC, IRON, RETICCTPCT in the last 72 hours. Sepsis Labs: No results for input(s): PROCALCITON, LATICACIDVEN in the last 168 hours.  Recent Results (from the past 240 hour(s))  Urine Culture     Status: Abnormal   Collection Time: 04/25/21  8:30 PM   Specimen: Urine, Clean Catch  Result Value Ref Range Status   Specimen Description   Final    URINE, CLEAN CATCH Performed at [facility], [address], [place], [place] [address]    Special Requests   Final    NONE Performed at [facility], [address] [place], [place] [address]    Culture >=100,000 COLONIES/mL PROVIDENCIA RETTGERI (A)  Final   Report Status 04/28/2021 FINAL  Final   Organism ID, Bacteria PROVIDENCIA RETTGERI (A)  Final      Susceptibility   Providencia rettgeri - MIC*    AMPICILLIN RESISTANT Resistant     CEFAZOLIN >=64 RESISTANT Resistant     CEFEPIME <=0.12 SENSITIVE Sensitive     CEFTRIAXONE <=0.25 SENSITIVE Sensitive     CIPROFLOXACIN <=0.25 SENSITIVE Sensitive     GENTAMICIN <=1 SENSITIVE Sensitive     IMIPENEM 2 SENSITIVE Sensitive     NITROFURANTOIN 128 RESISTANT Resistant     TRIMETH/SULFA <=20 SENSITIVE Sensitive     AMPICILLIN/SULBACTAM <=2 SENSITIVE Sensitive     PIP/TAZO <=4 SENSITIVE Sensitive     * >=100,000 COLONIES/mL PROVIDENCIA RETTGERI  CULTURE, BLOOD (ROUTINE X 2) w Reflex to ID Panel     Status: None   Collection Time: 04/27/21  4:53 AM   Specimen: BLOOD  Result Value Ref Range Status   Specimen Description BLOOD LEFT FA  Final   Special Requests   Final    BOTTLES DRAWN AEROBIC AND ANAEROBIC Blood Culture adequate volume   Culture   Final    NO GROWTH 6 DAYS Performed at [facility], [address]  [place], [place] [address]    Report Status 05/03/2021 FINAL  Final  CULTURE, BLOOD (ROUTINE X 2) w Reflex to ID Panel     Status: None   Collection Time: 04/27/21  4:53 AM   Specimen: BLOOD LEFT HAND  Result Value Ref Range Status   Specimen Description BLOOD LEFT HAND  Final   Special Requests   Final    BOTTLES DRAWN AEROBIC AND ANAEROBIC Blood Culture adequate volume   Culture   Final    NO GROWTH 6 DAYS Performed at [facility],  [place], [place] [address]    Report Status 05/03/2021 FINAL  Final         Radiology Studies: No results found.      Scheduled Meds:  degarelix  240 mg Subcutaneous Once   enoxaparin (LOVENOX) injection  40 mg Subcutaneous Q24H   feeding supplement  237 mL Oral BID BM   ferrous sulfate  325 mg Oral Q breakfast   folic acid  1 mg Oral Daily   melatonin  5 mg Oral QHS   midodrine  10 mg Oral TID WC   mirtazapine  15 mg Oral QHS   polyethylene glycol  17 g Oral Daily   Vitamin D (Ergocalciferol)  50,000 Units Oral Q7 days   Continuous Infusions:     LOS: 36 days    Time spent: 25 minutes    Sidney Ace, MD [facility] Pager [phone]  If 7PM-7AM, please contact night-coverage 05/05/2021, 4:13 PM

## 2021-05-05 NOTE — Progress Notes (Signed)
Per Pickenpack-cousar, NP and patient refuses to wear DNR bracelet.

## 2021-05-05 NOTE — Progress Notes (Signed)
Daily Progress Note   Patient Name: Ryan [name]       Date: 05/05/2021 DOB: 1961/03/30  Age: 60 y.o. MRN#: [id number] Attending Physician: Sidney Ace, MD Primary Care Physician: Pcp, No Admit Date: 03/30/2021  Reason for Consultation/Follow-up: Establishing goals of care  Subjective: Chart Reviewed. Updates Received. Patient Assessed.   Patient sitting up in bed watching tv. He is preparing to eat breakfast. Alert and oriented. Denies pain or shortness of breath. No family at the bedside.   Patient was familiar with my role in his care and previous discussions. I created space to allow him to discuss how he is feeling regarding his health now that he has been in the hospital over a month. He states he is still remaining hopeful but also concerned that his strength is not improving as much as he would like.   I requested to involve patient's aunt in discussions Ryan [name]) as he has previously expressed she would be his medical decision maker if ever unable to speak for himself. Patient provided permission. We were able to reach her by phone and she participated in goals of care discussion.   I created an opportunity to discuss his poor overall nutrition and refusal of medications.  He states he does not like the Lovenox injections because he is so thin and it stings. Education provided on rationale for medication. He states he declined his [place] because he had a "knot" at the previous injection site and was concerned.  Further education provided including support to patient if he chose not to receive medications with an option of a more comfort focused level of care. Patient and aunt verbalized understanding.   Both Ryan [name] and his aunt states they are not ready to accept end-of-life. He states plans to be more compliant in his care allowing for medications. They are clear at anytime he decides the level of care he is receiving is not conducive to his wishes we can re-evaluate and shift  focus.   I discussed at length his overall poor nutrition, with a BMI of 12. He states he has been trying to increase his oral intake, asking his aunt to bring him in a Big Mac. Education provided on artificial feedings in the setting of needed nutritional support. Patient emphasizes wishes for NO ARTIFICIAL FEEDING/PEG. He becomes tearful sharing previous experience with a family member who required a PEG tube and the misery he experienced. 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 I had a frank discussion with him and Ryan [name] sharing if things did not improve he would be facing end-of-life. They both verbalized understanding. Ryan [name] shares her understanding of his rationale for not wanting feeding tube and agrees with his decision but also encouraging him to eat more.   We discussed again patient's full code status with consideration to his current illness, co-morbidities, and minimal chances for survival if he encounter such event. Ryan [name] verbalized understanding acknowledging his frailty and high chances for a traumatic experience. He and Ryan [name] both mutually agreed for DNR/DNI. Education provided on what a DNR means and would look like for him. He verbalized understanding with both again confirming wishes. I explained his RN would be placing a bracelet on him to identify his wishes however he declines and requesting to tape to his bed or the computer as he is afraid it would slide off like his other bracelets  has done. Advised staff would make sure it is secure however he continues to decline placement.   All questions answered and support provided.    Length of Stay: 36 days  Vital Signs: BP (!) 88/57 (BP Location: Left Leg)   Pulse (!) 107   Temp 98 F (36.7 C)   Resp 18   Ht _0  (1.956 m)   Wt 46.8 kg   SpO2 97%   BMI 12.23 kg/m  SpO2: SpO2: 97 % O2 Device: O2 Device:  Room Air O2 Flow Rate: O2 Flow Rate (L/min): 0 L/min  Physical Exam: NAD, cachectic, ill-appearing  RRR Diminished bilateral Muscle wasting AAOx3, mood appropriate            Palliative Care Assessment & Plan  HPI: Palliative Care consult requested for goals of care discussion in this 60 y.o. male with a medical history significant for polysubstance abuse including THC, cocaine, alcohol, remote history of tobacco use, history of right proximal femur inter trochanteric fracture status post right IM nail intertrochanteric fixation (04/02/2020) and rib fractures s/p mechanical fall and lying on the floor x2 days. He presented to the ED from home with complaints of right hip pain. Per notations patient reports pain has been ongoing for several months. During work-up hemoglobin 5.6. Alk phos 884. He received 2 units Packed Red blood cells. CT abdomen pelvis showed diffuse skeletal metastatic disease, pathological right-sided rib fractures. Oncology following and treating in the setting of PSA greater than 3000. 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 Patient started on [place] and [place].    Code Status: DNR  Goals of Care/Recommendations: DNR/DNI-as requested and confirmed by patient. Does not wish to wear bracelet.  Continue to treat the treatable Lengthy discussion with patient and his aunt regarding goals of care and poor prognosis in the setting of refusal of medications and poor oral nutrition. Patient and family are clear they are not interested in end-of-life at this time with patient stating he plans to do what is necessary to thrive.  PMT will continue to support and follow.   Prognosis: Guarded-Poor   Discharge Planning: To Be Determined currently having difficulty to place. DSS is working with [facility], patient is unable to return home independently.   Thank you for allowing the Palliative Medicine Team to assist in the care of this patient.  Time Total:  65 min.   Visit consisted of counseling and education dealing with the complex and emotionally intense issues of symptom management and palliative care in the setting of serious and potentially life-threatening illness.Greater than 50%  of this time was spent counseling and coordinating care related to the above assessment and plan.  Alda Lea, AGPCNP-BC  Palliative Medicine Team [phone]

## 2021-05-05 NOTE — Progress Notes (Signed)
Nothing new, continuing to monitor     05/05/21 0744 05/05/21 1219  Assess: MEWS Score  Temp 98 F (36.7 C) 98.3 F (36.8 C)  BP (!) 88/57 99/65  Pulse Rate (!) 107 (!) 104  Resp 18 18  SpO2  --  98 %  O2 Device  --  Room Air  Assess: MEWS Score  MEWS Temp 0 0  MEWS Systolic 1 1  MEWS Pulse 1 1  MEWS RR 0 0  MEWS LOC 0 0  MEWS Score 2 2  MEWS Score Color Yellow Yellow  Assess: if the MEWS score is Yellow or Red  Were vital signs taken at a resting state?  --  Yes  Focused Assessment  --  No change from prior assessment  Does the patient meet 2 or more of the SIRS criteria?  --  No  Does the patient have a confirmed or suspected source of infection?  --  No  MEWS guidelines implemented *See Row Information*  --  No, previously yellow, continue vital signs every 4 hours  Notify: Charge Nurse/RN  Name of Charge Nurse/RN Notified  --  debbie RN  Date Charge Nurse/RN Notified  --  05/05/21  Time Charge Nurse/RN Notified  --  1220  Assess: SIRS CRITERIA  SIRS Temperature  0 0  SIRS Pulse 1 1  SIRS Respirations  0 0  SIRS WBC 0 0  SIRS Score Sum  1 1

## 2021-05-06 ENCOUNTER — Other Ambulatory Visit: Payer: Self-pay | Admitting: Oncology

## 2021-05-06 MED ORDER — DEGARELIX ACETATE(240 MG DOSE) 120 MG/VIAL ~~LOC~~ SOLR
120.0000 mg | Freq: Once | SUBCUTANEOUS | Status: AC
  Administered 2021-05-06: 19:00:00 120 mg via SUBCUTANEOUS
  Filled 2021-05-06 (×2): qty 3

## 2021-05-06 NOTE — Progress Notes (Signed)
PT Cancellation Note  Patient Details Name: Psalm Rosco MRN: [id number] DOB: Nov 18, 1960   Cancelled Treatment:     PT attempt 2 x this AM and pt was unwilling. Max encouragement however pt continues to be resistive. " My breathing is too bad to work today." Pt was educated on need for OOB activity to reduce chances of cardiopulmonary decline in addition to strength loss however pt still unwilling. Acute PT will continue efforts as able per pt willingness.     [name] 05/06/2021, 11:20 AM

## 2021-05-06 NOTE — Progress Notes (Signed)
PROGRESS NOTE    [place]  [id number] DOB: 01-27-1961 DOA: 03/30/2021 PCP: Ryan [name], No    Brief Narrative:  Ryan [name] is a 60 y.o. male with medical history significant for history of polysubstance abuse including THC, cocaine, alcohol, remote history of tobacco use, history of right proximal femur inter trochanteric fracture status post right IM nail intertrochanteric fixation in 04/02/2021, does not follow-up with a PCP, presents to the emergency department for chief concerns of right hip pain.  Pain is chronic for several months, denies any trauma, difficulty ambulation due to pain. He also endorses an unintentional 35 pound weight loss in the last year, denies bright red blood per rectum and melena. patient was found to have L5 compression fracture, L3/4 severe stenosis.  She was also found to have widespread bony metastasis  from prostate cancer. Patient is a followed by oncology.  He needed SNF placement, not able to find accepting facility. He is intermittently refusing some meds.  He Was febrile over the weekend and his blood cultures show 1/4 bottles GPC, possibly contaminant. He has frequent urination and occasional dry cough. CXR unremarkable abut UA is positive for nitrites, rare bacteria. Urine cultures and repeat Blood cultures ordered, if they are negative will discontinue antibiotics in the next 24 to 48 hours.   Pt seen and examined at bedside, no new complaints.  Oral intake is remained poor.  Case discussed with registered dietitian.  Patient not meeting his nutritional goals and has been intermittently refusing medications and multivitamins.  Also intermittently refusing supplemental shakes.  In order to meet nutritional goals patient will likely need a PEG tube.  We will continue to encourage patient to eat and discuss possibility of enteral feeding.  7/27: Palliative care reconsulted to discuss goals of care.  After lengthy discussion with the patient and patient agreed  that he was interested in continuing aggressive treatment and at this point not entertaining de-escalation of care or comfort measures.  Stressed the importance of medication adherence and oral intake.   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 Neurosurgery consulted recommended not a surgical candidate Plan: Oncology following for inpatient cancer related needs. Patient has been intermittently refusing cancer related medications after repeated discussions he agrees to take the prescribed medications    Severe symptomatic anemia with folic acid deficiency [id number] Hb 7.0, transfused 1 unit of PRBC 7/24 Hb 8.1 stable  Monitor H&H Transfuse to keep hemoglobin greater than 7.   Hypomagnesemia, mag repleted.  Monitor and replete as needed.   Hyponatremia Likely multifactorial secondary to possible SIADH versus hypovolemia Appears chronic and asymptomatic Plan: Fluid restriction and sodium tabs have been discontinued Encourage PO intake     Hypokalemia, resolved non anion gap metabolic acidosis, [id number] started po bicarb 650 TID x 4 days       Cancer cachexia with severe protein calorie malnutrition Dietary on board recommend encouraging oral intake. Patient has been intermittently refusing multivitamins and protein  supplements.  Not meeting his nutritional goals.  Has adamantly refused PEG tube placement.  Has agreed to  attempt to eat more.  Remove all dietary restrictions.   Left heel pain: X rays of the foot ordered to evaluate for pathological fractures. Negative. Increased oxycodone 1 to 2 pill every 6 hours prn.   Hypotension: Asymptomatic, keep MAP greater than 65, on Midodrine 10 mg po TID Has been taking midodrine intermittently.  When he takes his blood pressure is stable     Fever: No evidence of infection.  No indication for antibiotics   DVT prophylaxis: SQ Lovenox Code Status: Full Family Communication: Ryan [name] [phone] at bedside on 7/27 disposition Plan: Status is: Inpatient  Remains inpatient appropriate because:Inpatient level of care appropriate due to severity of illness  Dispo: The patient is from: Home              Anticipated d/c is to:  TBD              Patient currently is medically stable to d/c.   Difficult to place patient Yes  Unclear disposition plan at this time.  Extended hospitalization.  Severely malnourished.  Not meeting nutritional goals.     Level of care: Med-Surg  Consultants:  None  Procedures: None  Antimicrobials:  None   Subjective: Seen and examined.  No distress.  Worked with PT today.  Attempting to eat more  Objective: Vitals:   05/05/21 2127 05/06/21 0000 05/06/21 0459 05/06/21 0738  BP: 101/65 105/62 107/65 103/63  Pulse: 92 93 (!) 105 99  Resp: '16 16 16 16  '$ Temp: 98.9 F (37.2 C) 97.9 F (36.6 C) 98.7 F (37.1 C) 98.6 F (37 C)  TempSrc:  Oral    SpO2: 96% 99% 99% 100%  Weight:      Height:       No intake or output data in the 24 hours ending 05/06/21 1540  Filed Weights   04/08/21 0800 04/14/21 1246 05/04/21 0846  Weight: 41.6 kg 51.5 kg 46.8 kg    Examination:  General exam: No acute distress.  Thin and frail.  Fatigued Respiratory system: Clear to auscultation. Respiratory effort normal. Cardiovascular system: S1-S2, regular rate and rhythm, no murmurs, no pedal edema gastrointestinal  system: Scaphoid, nontender, nondistended, hyperactive bowel sounds Central nervous system: Alert and oriented.  No focal deficits Extremities: Diffusely decreased power bilaterally Skin: No rashes, lesions or ulcers Psychiatry: Judgement and insight appear normal. Mood & affect appropriate.     Data Reviewed: I have personally reviewed following labs and imaging studies  CBC: Recent Labs  Lab 04/30/21 0424 05/01/21 0537 05/02/21 0426 05/03/21 0412  WBC 5.6 7.3 7.2 8.1  HGB 8.4* 8.8* 8.1* 7.9*  HCT 25.7* 25.5* 24.0* 23.8*  MCV 78.6* 80.7 80.3 79.1*  PLT 211 182 209 [id number]   Basic Metabolic Panel: Recent Labs  Lab 04/30/21 0424 05/01/21 0537 05/02/21 0426 05/03/21 0412  NA 129* 129* 125* 127*  K 4.4 4.6 4.5 4.4  CL 104 103 100 102  CO2 18* 20* 18* 19*  GLUCOSE 96 88 86 116*  BUN '11 10 10 11  '$ CREATININE 0.43* 0.41* 0.44* 0.53*  CALCIUM 8.0* 8.2* 8.2* 7.8*  MG 1.7 1.7  --   --   PHOS 3.4 3.4  --   --    GFR: Estimated Creatinine Clearance: 65.8 mL/min (A) (by C-G formula based on SCr of 0.53 mg/dL (L)). Liver Function Tests: No results for input(s): AST, ALT,  ALKPHOS, BILITOT, PROT, ALBUMIN in the last 168 hours. No results for input(s): LIPASE, AMYLASE in the last 168 hours. No results for input(s): AMMONIA in the last 168 hours. Coagulation Profile: No results for input(s): INR, PROTIME in the last 168 hours. Cardiac Enzymes: No results for input(s): CKTOTAL, CKMB, CKMBINDEX, TROPONINI in the last 168 hours. BNP (last 3 results) No results for input(s): PROBNP in the last 8760 hours. HbA1C: No results for input(s): HGBA1C in the last 72 hours. CBG: No results for input(s): GLUCAP in the last 168 hours. Lipid Profile: No results for input(s): CHOL, HDL, LDLCALC, TRIG, CHOLHDL, LDLDIRECT in the last 72 hours. Thyroid Function Tests: No results for input(s): TSH, T4TOTAL, FREET4, T3FREE, THYROIDAB in the last 72 hours. Anemia Panel: No results for input(s):  VITAMINB12, FOLATE, FERRITIN, TIBC, IRON, RETICCTPCT in the last 72 hours. Sepsis Labs: No results for input(s): PROCALCITON, LATICACIDVEN in the last 168 hours.  Recent Results (from the past 240 hour(s))  CULTURE, BLOOD (ROUTINE X 2) w Reflex to ID Panel     Status: None   Collection Time: 04/27/21  4:53 AM   Specimen: BLOOD  Result Value Ref Range Status   Specimen Description BLOOD LEFT FA  Final   Special Requests   Final    BOTTLES DRAWN AEROBIC AND ANAEROBIC Blood Culture adequate volume   Culture   Final    NO GROWTH 6 DAYS Performed at [facility], [address] [place], [place] [address]    Report Status 05/03/2021 FINAL  Final  CULTURE, BLOOD (ROUTINE X 2) w Reflex to ID Panel     Status: None   Collection Time: 04/27/21  4:53 AM   Specimen: BLOOD LEFT HAND  Result Value Ref Range Status   Specimen Description BLOOD LEFT HAND  Final   Special Requests   Final    BOTTLES DRAWN AEROBIC AND ANAEROBIC Blood Culture adequate volume   Culture   Final    NO GROWTH 6 DAYS Performed at [facility], [address], [place], [place] [address]    Report Status 05/03/2021 FINAL  Final         Radiology Studies: No results found.      Scheduled Meds:  degarelix  120 mg Subcutaneous Once   enoxaparin (LOVENOX) injection  40 mg Subcutaneous Q24H   feeding supplement  237 mL Oral BID BM   ferrous sulfate  325 mg Oral Q breakfast   folic acid  1 mg Oral Daily   melatonin  5 mg Oral QHS   midodrine  10 mg Oral TID WC   mirtazapine  15 mg Oral QHS   polyethylene glycol  17 g Oral Daily   Vitamin D (Ergocalciferol)  50,000 Units Oral Q7 days   Continuous Infusions:     LOS: 37 days    Time spent: 15 minutes    Sidney Ace, MD [facility] Pager [phone]  If 7PM-7AM, please contact night-coverage 05/06/2021, 3:40 PM

## 2021-05-06 NOTE — TOC Progression Note (Signed)
Transition of Care [facility]) - Progression Note    Patient Details  Name: Ryan [name] MRN: [id number] Date of Birth: October 18, 1960  Transition of Care [facility]) CM/SW [place], RN Phone Number: 05/06/2021, 11:10 AM  Clinical Narrative:   As per W[place] Cunas, medicaid still in process.  Patient has no bed offers despite resubmission of information.  DSS caseworker still in process of investigating, will contact TOC upon completion of investigation.          Expected Discharge Plan and Services                                                 Social Determinants of Health (SDOH) Interventions    Readmission Risk Interventions Readmission Risk Prevention Plan 04/23/2021  Transportation Screening Complete  PCP or Specialist Appt within 3-5 Days Complete  HRI or [place] Complete  Social Work Consult for [place] Planning/Counseling Not Complete  SW consult not completed comments RNCM assigned to patient  Palliative Care Screening Complete  Medication Review [employer]) Complete  Some recent data might be hidden

## 2021-05-06 NOTE — Progress Notes (Signed)
[facility]  [phone] [phone]  ID: [name] OB: January 03, 1961  MR#: [id number]  [id number]  Patient Care Team: Pcp, No as PCP - General  CHIEF COMPLAINT: Stage IV prostate cancer with widespread bony disease.  INTERVAL HISTORY: Patient remains in the hospital secondary to difficult placement issues.  Patient more uncomfortable currently, but nursing reports he just had a bath.  He continues to have good appetite.  He continues to intermittently refuse physical therapy as well as certain medications, but did agree to take his prostate cancer treatment, Mills Koller, today.   REVIEW OF SYSTEMS:   Review of Systems  Constitutional:  Positive for malaise/fatigue. Negative for fever and weight loss.  Respiratory: Negative.  Negative for cough, hemoptysis and shortness of breath.   Cardiovascular: Negative.  Negative for chest pain and leg swelling.  Gastrointestinal: Negative.  Negative for abdominal pain.  Genitourinary: Negative.  Negative for dysuria.  Musculoskeletal: Negative.  Negative for back pain.  Skin: Negative.  Negative for rash.  Neurological:  Positive for weakness. Negative for dizziness, sensory change, focal weakness and headaches.  Psychiatric/Behavioral: Negative.  The patient is not nervous/anxious.    As per HPI. Otherwise, a complete review of systems is negative.  PAST MEDICAL HISTORY: Past Medical History:  Diagnosis Date   Adenomatous colon polyp    Anemia    Depression    Elevated LFTs     PAST SURGICAL HISTORY: Past Surgical History:  Procedure Laterality Date   FRACTURE SURGERY     INTRAMEDULLARY (IM) NAIL INTERTROCHANTERIC Right 04/02/2020   Procedure: INTRAMEDULLARY (IM) NAIL INTERTROCHANTRIC;  Surgeon: Corky Mull, MD;  Location: [facility] ORS;  Service: Orthopedics;  Laterality: Right;    FAMILY HISTORY: Family History  Problem Relation Age of Onset   Heart disease Other    Colon polyps Cousin         maternal   Colon polyps Cousin    Colon polyps Maternal Aunt    Colon cancer Maternal Grandfather     ADVANCED DIRECTIVES (Y/N):  '@ADVDIR'$ @  HEALTH MAINTENANCE: Social History   Tobacco Use   Smoking status: Some Days   Smokeless tobacco: Never  Substance Use Topics   Alcohol use: Yes    Comment: 4 beers weekly   Drug use: No     Colonoscopy:  PAP:  Bone density:  Lipid panel:  No Known Allergies  Current Facility-Administered Medications  Medication Dose Route Frequency Provider Last Rate Last Admin   acetaminophen (TYLENOL) tablet 650 mg  650 mg Oral Q4H PRN Sreenath, Sudheer B, MD       bisacodyl (DULCOLAX) EC tablet 5 mg  5 mg Oral Daily PRN Val Riles, MD       bisacodyl (DULCOLAX) suppository 10 mg  10 mg Rectal Daily PRN Val Riles, MD       degarelix [facility]) injection 120 mg  120 mg Subcutaneous Once Lloyd Huger, MD       diclofenac Sodium (VOLTAREN) 1 % topical gel 2 g  2 g Topical PRN Nolberto Hanlon, MD   2 g at 05/06/21 0359   enoxaparin (LOVENOX) injection 40 mg  40 mg Subcutaneous Q24H Patel, Kishan S, RPH       feeding supplement (ENSURE ENLIVE / ENSURE PLUS) liquid 237 mL  237 mL Oral BID BM Kurtis Bushman, Sahar, MD   237 mL at 05/03/21 1113   ferrous sulfate tablet 325 mg  325 mg Oral Q breakfast Cox, Amy N, DO   325  mg at [id number]   folic acid (FOLVITE) tablet 1 mg  1 mg Oral Daily Cox, Amy N, DO   1 mg at 05/01/21 0956   guaiFENesin-dextromethorphan (ROBITUSSIN DM) 100-10 MG/5ML syrup 5 mL  5 mL Oral Q8H PRN Hosie Poisson, MD   5 mL at 05/06/21 0211   hydrOXYzine (ATARAX/VISTARIL) tablet 25 mg  25 mg Oral TID PRN Val Riles, MD   25 mg at 04/20/21 1246   ibuprofen (ADVIL) tablet 400 mg  400 mg Oral Q6H PRN Ralene Muskrat B, MD   400 mg at 05/05/21 2043   melatonin tablet 5 mg  5 mg Oral QHS Val Riles, MD   5 mg at 05/06/21 0203   methocarbamol (ROBAXIN) tablet 500 mg  500 mg Oral Q8H PRN Ralene Muskrat B, MD       midodrine  (PROAMATINE) tablet 10 mg  10 mg Oral TID WC Val Riles, MD   10 mg at 05/06/21 0935   mirtazapine (REMERON) tablet 15 mg  15 mg Oral QHS Ralene Muskrat B, MD   15 mg at 05/04/21 2159   ondansetron (ZOFRAN) tablet 4 mg  4 mg Oral Q6H PRN Cox, Amy N, DO       Or   ondansetron (ZOFRAN) injection 4 mg  4 mg Intravenous Q6H PRN Cox, Amy N, DO   4 mg at 04/14/21 [id number]   oxyCODONE (Oxy IR/ROXICODONE) immediate release tablet 5-10 mg  5-10 mg Oral Q6H PRN Hosie Poisson, MD   10 mg at 05/05/21 1158   phenol (CHLORASEPTIC) mouth spray 1 spray  1 spray Mouth/Throat PRN Val Riles, MD   1 spray at 04/02/21 0155   polyethylene glycol (MIRALAX / GLYCOLAX) packet 17 g  17 g Oral Daily Val Riles, MD   17 g at 04/25/21 1130   traZODone (DESYREL) tablet 25 mg  25 mg Oral QHS PRN Ralene Muskrat B, MD   25 mg at 05/06/21 0203   Vitamin D (Ergocalciferol) (DRISDOL) capsule 50,000 Units  50,000 Units Oral Q7 days Val Riles, MD   50,000 Units at 04/28/21 1812    OBJECTIVE: Vitals:   05/06/21 0459 05/06/21 0738  BP: 107/65 103/63  Pulse: (!) 105 99  Resp: 16 16  Temp: 98.7 F (37.1 C) 98.6 F (37 C)  SpO2: 99% 100%     Body mass index is 12.23 kg/m.    ECOG FS:2 - Symptomatic, <50% confined to bed  General: Thin, no acute distress. Eyes: Pink conjunctiva, anicteric sclera. HEENT: Normocephalic, moist mucous membranes. Lungs: No audible wheezing or coughing. Heart: Regular rate and rhythm. Abdomen: Soft, nontender, no obvious distention. Musculoskeletal: No edema, cyanosis, or clubbing. Neuro: Alert, answering all questions appropriately. Cranial nerves grossly intact. Skin: No rashes or petechiae noted. Psych: Normal affect.   LAB RESULTS:  Lab Results  Component Value Date   NA 127 (L) 05/03/2021   K 4.4 05/03/2021   CL 102 05/03/2021   CO2 19 (L) 05/03/2021   GLUCOSE 116 (H) 05/03/2021   BUN 11 05/03/2021   CREATININE 0.53 (L) 05/03/2021   CALCIUM 7.8 (L) 05/03/2021    PROT 4.8 (L) 04/03/2021   ALBUMIN 2.0 (L) 04/04/2021   AST 35 04/03/2021   ALT 6 04/03/2021   ALKPHOS 504 (H) 04/03/2021   BILITOT 0.7 04/03/2021   GFRNONAA >60 05/03/2021   GFRAA >60 04/06/2020    Lab Results  Component Value Date   WBC 8.1 05/03/2021   NEUTROABS 4.5 04/14/2021   HGB  7.9 (L) 05/03/2021   HCT 23.8 (L) 05/03/2021   MCV 79.1 (L) 05/03/2021   PLT 227 05/03/2021     STUDIES: DG Chest Port 1 View  Result Date: 04/25/2021 CLINICAL DATA:  Fever.  Prostate cancer with metastases to the bone. EXAM: PORTABLE CHEST 1 VIEW COMPARISON:  March 30, 2021 FINDINGS: The study is limited due to the kyphotic positioning. No pneumothorax. Diffuse widespread bony metastatic disease. The heart, hila, and visualized mediastinum are unremarkable although the superior mediastinum is not well seen. No pulmonary nodules, masses, or definitive focal infiltrates. IMPRESSION: The study is limited due to positioning. A better positioned PA and lateral chest x-ray could better evaluate the lungs. Within these limitations, no cause for fever identified. Widespread bony metastatic disease. Electronically Signed   By: Dorise Bullion III M.D   On: 04/25/2021 12:54   DG Foot Complete Left  Result Date: 04/23/2021 CLINICAL DATA:  Heel pain sharp pain in heel in a 60 year old male. No reported injury. EXAM: LEFT FOOT - COMPLETE 3+ VIEW COMPARISON:  December of 2014. FINDINGS: Material seen about the hindfoot on the AP view and oblique view limiting bony detail somewhat. Osteopenia. No sign of acute fracture or dislocation. No significant soft tissue swelling. Pes planus as on the previous study. IMPRESSION: 1. Osteopenia. No acute findings. 2. Pes planus. Electronically Signed   By: Zetta Bills M.D.   On: 04/23/2021 14:03    ASSESSMENT: Stage IV prostate cancer with widespread bony disease.  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 CT scan results reviewed independently and  reported as above with widespread bony disease, but no obvious visceral lesions.  Patient's PSA continues to be greater than 3000, but he is only received 1 dose of Firmagon approximately 1 month ago.  Patient declined his second treatment earlier this week but did agree to receive 120 mg subcutaneous Firmagon by the end of nursing shift today at 7 PM.  He received IV Zometa earlier this week.  His next dose of Zometa and Mills Koller will be 4 weeks from today.  Follow-up will be based pending discharge from hospital. Anemia: Chronic and unchanged.  Patient's most recent hemoglobin is 7.9.  Given the extent of patient's bony disease, he may have marrow infiltration.  Treatment for his prostate cancer as above.  Continue to maintain hemoglobin greater than 8.0. Thrombocytopenia: Resolved. Hypocalcemia: Given patient's decreased albumin, calcium levels corrected to near normal.  Patient received Zometa earlier this week. Substance abuse: Patient has a history of polysubstance abuse including cocaine.  Hypomagnesia: Resolved. Disposition: Difficult placement.  His next Prostate cancer treatment will be due on or near June 03, 2021.  Will follow.  Lloyd Huger, MD   05/06/2021 9:51 AM

## 2021-05-06 NOTE — Progress Notes (Signed)
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 TLSO recommended from nurosurgery and devliered to room.    PT Comments    Pt was long sitting in bed upon arriving. He needs max encouragement but eventually agreeable to PT session and OOB activity. On 2 L o2 [place] throughout session with sao2 > 94%. Pt did not allow author to attempt weaning from O2." My breathing is bad." Lengthy education about need to increase daily physical activity. He states understanding but still required max encouragement throughout. CGA throughout session to exit bed, stand, and ambulate with RW. No LOB or unsteadiness however poor posture. Unable to correct 2/2 to pain. RN aware of pt's abilities. PT continues to recommend SNF due to pt's lack of help/assistance or safe DC disposition.    Follow Up Recommendations  SNF     Equipment Recommendations  3in1 (PT);Wheelchair cushion (measurements PT);Rolling walker with 5" wheels;Wheelchair (measurements PT);Hospital bed       Precautions / Restrictions Precautions Precautions: Fall Spinal Brace: Other (comment) (pt did not want to wear back brace.Order is for comfort. Post op shoe was worn) Restrictions Weight Bearing Restrictions: No    Mobility  Bed Mobility Overal bed mobility: Needs Assistance Bed Mobility: Rolling;Supine to Sit;Sit to Supine Rolling: Supervision Sidelying to sit: Supervision Supine to sit: Supervision Sit to supine: Min assist   General bed mobility comments: Pt was able to  exit bed without physical assistance. did require min assist to progress BLEs into bed after OOB activity    Transfers Overall transfer level: Needs assistance Equipment used: Rolling walker (2 wheeled) Transfers: Sit to/from Stand Sit to Stand: Min guard         General transfer comment: CGA to stand from lowest bed height. vcs for correct hand placement and improved fwd wt shift  Ambulation/Gait Ambulation/Gait assistance: Min guard Gait Distance (Feet): 120 Feet Assistive device: Rolling walker (2 wheeled) Gait Pattern/deviations: Trunk flexed;Narrow base of support;Step-through pattern Gait velocity: decreased   General Gait Details: Pt was able to ambulate 120 ft with 2 L o2 + RW + LLE post op shoe. no LOB however constant vcs for effort. Author questions pts effort throughout session. Pt has been resistive for OOB activity per RN staff.     Balance Overall balance assessment: Needs assistance Sitting-balance support: Feet supported;Bilateral upper extremity supported Sitting balance-Leahy Scale: Good     Standing balance support: Bilateral upper extremity supported;During functional activity Standing balance-Leahy Scale: Good        Cognition Arousal/Alertness: Awake/alert Behavior During Therapy: Flat affect Overall Cognitive Status: Within Functional Limits for tasks assessed      General Comments: need encouragement throughout session for full participation         General Comments General comments (skin integrity, edema, etc.): Lengthy discussion with pt about need for increased physical activity throughout the day. Pt states understanding but will need encouragement      Pertinent Vitals/Pain Pain Assessment: No/denies pain Pain Score: 0-No pain     PT Goals (current goals can now be found in the care plan section) Acute Rehab  PT Goals Patient Stated Goal: to get stronger Progress towards PT goals: Progressing toward goals    Frequency    Min  2X/week      PT Plan Current plan remains appropriate    Co-evaluation     PT goals addressed during session: Mobility/safety with mobility;Strengthening/ROM        AM-PAC PT "6 Clicks" Mobility   Outcome Measure  Help needed turning from your back to your side while in a flat bed without using bedrails?: A Little Help needed moving from lying on your back to sitting on the side of a flat bed without using bedrails?: A Little Help needed moving to and from a bed to a chair (including a wheelchair)?: A Little Help needed standing up from a chair using your arms (e.g., wheelchair or bedside chair)?: A Little Help needed to walk in hospital room?: A Little Help needed climbing 3-5 steps with a railing? : A Little 6 Click Score: 18    End of Session Equipment Utilized During Treatment: Gait belt Activity Tolerance: Patient tolerated treatment well Patient left: in bed;with call bell/phone within reach;with bed alarm set Nurse Communication: Mobility status PT Visit Diagnosis: Unsteadiness on feet (R26.81);Muscle weakness (generalized) (M62.81);Difficulty in walking, not elsewhere classified (R26.2)     Time: [id number] PT Time Calculation (min) (ACUTE ONLY): 19 min  Charges:  $Gait Training: 8-22 mins                     Julaine Fusi PTA 05/06/21, 2:48 PM

## 2021-05-06 NOTE — Plan of Care (Signed)
  Problem: Education: Goal: Knowledge of General Education information will improve Description: Including pain rating scale, medication(s)/side effects and non-pharmacologic comfort measures Outcome: Progressing  Patient aware of clinical condition, acceptance of medication education, expresses pain verbally

## 2021-05-07 LAB — CBC WITH DIFFERENTIAL/PLATELET
Abs Immature Granulocytes: 0.09 10*3/uL — ABNORMAL HIGH (ref 0.00–0.07)
Basophils Absolute: 0 10*3/uL (ref 0.0–0.1)
Basophils Relative: 0 %
Eosinophils Absolute: 0 10*3/uL (ref 0.0–0.5)
Eosinophils Relative: 0 %
HCT: 22.7 % — ABNORMAL LOW (ref 39.0–52.0)
Hemoglobin: 7.5 g/dL — ABNORMAL LOW (ref 13.0–17.0)
Immature Granulocytes: 1 %
Lymphocytes Relative: 15 %
Lymphs Abs: 1.2 10*3/uL (ref 0.7–4.0)
MCH: 26.9 pg (ref 26.0–34.0)
MCHC: 33 g/dL (ref 30.0–36.0)
MCV: 81.4 fL (ref 80.0–100.0)
Monocytes Absolute: 0.6 10*3/uL (ref 0.1–1.0)
Monocytes Relative: 8 %
Neutro Abs: 6 10*3/uL (ref 1.7–7.7)
Neutrophils Relative %: 76 %
Platelets: 255 10*3/uL (ref 150–400)
RBC: 2.79 MIL/uL — ABNORMAL LOW (ref 4.22–5.81)
RDW: 16.7 % — ABNORMAL HIGH (ref 11.5–15.5)
WBC: 8 10*3/uL (ref 4.0–10.5)
nRBC: 0 % (ref 0.0–0.2)

## 2021-05-07 LAB — BASIC METABOLIC PANEL
Anion gap: 6 (ref 5–15)
BUN: 9 mg/dL (ref 6–20)
CO2: 20 mmol/L — ABNORMAL LOW (ref 22–32)
Calcium: 6.9 mg/dL — ABNORMAL LOW (ref 8.9–10.3)
Chloride: 103 mmol/L (ref 98–111)
Creatinine, Ser: 0.48 mg/dL — ABNORMAL LOW (ref 0.61–1.24)
GFR, Estimated: >60 mL/min (ref >60)
Glucose, Bld: 136 mg/dL — ABNORMAL HIGH (ref 70–99)
Potassium: 4.6 mmol/L (ref 3.5–5.1)
Sodium: 129 mmol/L — ABNORMAL LOW (ref 135–145)

## 2021-05-07 MED ORDER — BENZONATATE 100 MG PO CAPS
200.0000 mg | ORAL_CAPSULE | Freq: Two times a day (BID) | ORAL | Status: DC | PRN
  Administered 2021-05-07 (×2): 200 mg via ORAL
  Filled 2021-05-07 (×2): qty 2

## 2021-05-07 NOTE — Progress Notes (Signed)
PROGRESS NOTE    Ryan [name]  [id number] DOB: [birth year] DOA: 03/30/2021 PCP: Ryan [name], No    Brief Narrative:  Ryan [name] is a 60 y.o. male with medical history significant for history of polysubstance abuse including THC, cocaine, alcohol, remote history of tobacco use, history of right proximal femur inter trochanteric fracture status post right IM nail intertrochanteric fixation in 04/02/2021, does not follow-up with a PCP, presents to the emergency department for chief concerns of right hip pain.  Pain is chronic for several months, denies any trauma, difficulty ambulation due to pain. He also endorses an unintentional 35 pound weight loss in the last year, denies bright red blood per rectum and melena. patient was found to have L5 compression fracture, L3/4 severe stenosis.  She was also found to have widespread bony metastasis  from prostate cancer. Patient is a followed by oncology.  He needed SNF placement, not able to find accepting facility. He is intermittently refusing some meds.  He Was febrile over the weekend and his blood cultures show 1/4 bottles GPC, possibly contaminant. He has frequent urination and occasional dry cough. CXR unremarkable abut UA is positive for nitrites, rare bacteria. Urine cultures and repeat Blood cultures ordered, if they are negative will discontinue antibiotics in the next 24 to 48 hours.   Pt seen and examined at bedside, no new complaints.  Oral intake is remained poor.  Case discussed with registered dietitian.  Patient not meeting his nutritional goals and has been intermittently refusing medications and multivitamins.  Also intermittently refusing supplemental shakes.  In order to meet nutritional goals patient will likely need a PEG tube.  We will continue to encourage patient to eat and discuss possibility of enteral feeding.  7/27: Palliative care reconsulted to discuss goals of care.  After lengthy discussion with the patient and patient agreed  that he was interested in continuing aggressive treatment and at this point not entertaining de-escalation of care or comfort measures.  Stressed the importance of medication adherence and oral intake.   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 Neurosurgery consulted recommended not a surgical candidate Plan: Oncology following for inpatient cancer related needs. Patient has been intermittently refusing cancer related medications after repeated discussions he agrees to take the prescribed medications    Severe symptomatic anemia with folic acid deficiency 123456 Hb 7.0, transfused 1 unit of PRBC 7/24 Hb 8.1 stable  Monitor H&H Transfuse to keep hemoglobin greater than 7.   Hypomagnesemia, mag repleted.  Monitor and replete as needed.   Hyponatremia Likely multifactorial secondary to possible SIADH versus hypovolemia Appears chronic and asymptomatic Plan: Fluid restriction and sodium tabs have been discontinued Encourage PO intake     Hypokalemia, resolved non anion gap metabolic acidosis, 99991111 started po bicarb 650 TID x 4 days       Cancer cachexia with severe protein calorie malnutrition Dietary on board recommend encouraging oral intake. Patient has been intermittently refusing multivitamins and protein  supplements.  Not meeting his nutritional goals.  Has adamantly refused PEG tube placement.  Has agreed to  attempt to eat more.  Remove all dietary restrictions.   Left heel pain: X rays of the foot ordered to evaluate for pathological fractures. Negative. Increased oxycodone 1 to 2 pill every 6 hours prn.   Hypotension: Asymptomatic, keep MAP greater than 65, on Midodrine 10 mg po TID Has been taking midodrine intermittently.  When he takes his blood pressure is stable Ensure medication adherence     Fever: No evidence of infection.  No indication for antibiotics   DVT prophylaxis: SQ Lovenox Code Status: Full Family Communication: Ryan [name] [phone] at bedside on 7/27 disposition Plan: Status is: Inpatient  Remains inpatient appropriate because:Inpatient level of care appropriate due to severity of illness  Dispo: The patient is from: Home              Anticipated d/c is to:  TBD              Patient currently is medically stable to d/c.   Difficult to place patient Yes  Unclear disposition plan at this time.  Extended hospitalization.  Severely malnourished.  Not meeting nutritional goals.  Attempting to eat more.  Difficult placement     Level of care: Med-Surg  Consultants:  None  Procedures: None  Antimicrobials:  None   Subjective: Seen and examined.  No distress.  Worked with PT today.  Attempting to eat more  Objective: Vitals:   05/06/21 2341 05/07/21 0354 05/07/21 0915 05/07/21 1125  BP: 118/70 108/64 105/67 107/62  Pulse: (!) 104 (!) 109 96 (!) 103  Resp: '17 17 16 18  '$ Temp: 98.7 F (37.1 C) 98.6 F (37 C)  98.4 F (36.9 C)  TempSrc:      SpO2: 100% 100% 97%   Weight:      Height:        Intake/Output Summary (Last 24 hours) at 05/07/2021 1242 Last data filed at 05/07/2021 1120 Gross per 24 hour  Intake --  Output 551 ml  Net -551 ml    Filed Weights   04/08/21 0800 04/14/21 1246 05/04/21 0846  Weight: 41.6 kg 51.5 kg 46.8 kg    Examination:  General exam: No acute distress.  Thin and frail.  Fatigued Respiratory  system: Clear to auscultation. Respiratory effort normal. Cardiovascular system: S1-S2, regular rate and rhythm, no murmurs, no pedal edema gastrointestinal system: Scaphoid, nontender, nondistended, hyperactive bowel sounds Central nervous system: Alert and oriented.  No focal deficits Extremities: Diffusely decreased power bilaterally Skin: No rashes, lesions or ulcers Psychiatry: Judgement and insight appear normal. Mood & affect appropriate.     Data Reviewed: I have personally reviewed following labs and imaging studies  CBC: Recent Labs  Lab 05/01/21 0537 05/02/21 0426 05/03/21 0412 05/07/21 0523  WBC 7.3 7.2 8.1 8.0  NEUTROABS  --   --   --  6.0  HGB 8.8* 8.1* 7.9* 7.5*  HCT 25.5* 24.0* 23.8* 22.7*  MCV 80.7 80.3 79.1* 81.4  PLT 182 209 227 123456   Basic Metabolic Panel: Recent Labs  Lab 05/01/21 0537 05/02/21 0426 05/03/21 0412 05/07/21 0523  NA 129* 125* 127* 129*  K 4.6 4.5 4.4 4.6  CL 103 100 102 103  CO2 20* 18* 19* 20*  GLUCOSE 88 86 116* 136*  BUN '10 10 11 9  '$ CREATININE 0.41* 0.44* 0.53* 0.48*  CALCIUM 8.2* 8.2* 7.8* 6.9*  MG 1.7  --   --   --  PHOS 3.4  --   --   --    GFR: Estimated Creatinine Clearance: 65.8 mL/min (A) (by C-G formula based on SCr of 0.48 mg/dL (L)). Liver Function Tests: No results for input(s): AST, ALT, ALKPHOS, BILITOT, PROT, ALBUMIN in the last 168 hours. No results for input(s): LIPASE, AMYLASE in the last 168 hours. No results for input(s): AMMONIA in the last 168 hours. Coagulation Profile: No results for input(s): INR, PROTIME in the last 168 hours. Cardiac Enzymes: No results for input(s): CKTOTAL, CKMB, CKMBINDEX, TROPONINI in the last 168 hours. BNP (last 3 results) No results for input(s): PROBNP in the last 8760 hours. HbA1C: No results for input(s): HGBA1C in the last 72 hours. CBG: No results for input(s): GLUCAP in the last 168 hours. Lipid Profile: No results for input(s): CHOL, HDL, LDLCALC, TRIG, CHOLHDL,  LDLDIRECT in the last 72 hours. Thyroid Function Tests: No results for input(s): TSH, T4TOTAL, FREET4, T3FREE, THYROIDAB in the last 72 hours. Anemia Panel: No results for input(s): VITAMINB12, FOLATE, FERRITIN, TIBC, IRON, RETICCTPCT in the last 72 hours. Sepsis Labs: No results for input(s): PROCALCITON, LATICACIDVEN in the last 168 hours.  No results found for this or any previous visit (from the past 240 hour(s)).        Radiology Studies: No results found.      Scheduled Meds:  enoxaparin (LOVENOX) injection  40 mg Subcutaneous Q24H   feeding supplement  237 mL Oral BID BM   ferrous sulfate  325 mg Oral Q breakfast   folic acid  1 mg Oral Daily   melatonin  5 mg Oral QHS   midodrine  10 mg Oral TID WC   mirtazapine  15 mg Oral QHS   polyethylene glycol  17 g Oral Daily   Vitamin D (Ergocalciferol)  50,000 Units Oral Q7 days   Continuous Infusions:     LOS: 38 days    Time spent: 15 minutes    Sidney Ace, MD [facility] Pager [phone]  If 7PM-7AM, please contact night-coverage 05/07/2021, 12:42 PM

## 2021-05-07 NOTE — Progress Notes (Addendum)
PT Cancellation Note  Patient Details Name: Ryan [name] MRN: [id number] DOB: [birth year]   Cancelled Treatment:    Reason Eval/Treat Not Completed: Other (comment) Attempted to see pt for PT session with pt lying in bed on 2L/min via nasal cannula, SpO2 99-100%, HR 110-115 bpm. Pt initially reports he wants to participate and "it's time to get serious" but declines activity at this time, stating he's not feeling well but he doesn't elaborate, nurse made aware. Will f/u as able.   [name], PT, DPT 05/07/21, 10:31 AM    [name] 05/07/2021, 10:30 AM

## 2021-05-07 NOTE — Plan of Care (Signed)
  Problem: Clinical Measurements: Goal: Respiratory complications will improve Outcome: Not Progressing  Patient SOB on exerion, cough medication provided prn x 2. Dry non productive cough noted

## 2021-05-08 NOTE — Progress Notes (Signed)
PROGRESS NOTE    Ryan [name]  [id number] DOB: December 18, 1960 DOA: 03/30/2021 PCP: Merryl Hacker, No    Brief Narrative:  Ryan [name] is a 60 y.o. male with medical history significant for history of polysubstance abuse including THC, cocaine, alcohol, remote history of tobacco use, history of right proximal femur inter trochanteric fracture status post right IM nail intertrochanteric fixation in 04/02/2021, does not follow-up with a PCP, presents to the emergency department for chief concerns of right hip pain.  Pain is chronic for several months, denies any trauma, difficulty ambulation due to pain. He also endorses an unintentional 35 pound weight loss in the last year, denies bright red blood per rectum and melena. patient was found to have L5 compression fracture, L3/4 severe stenosis.  She was also found to have widespread bony metastasis  from prostate cancer. Patient is a followed by oncology.  He needed SNF placement, not able to find accepting facility. He is intermittently refusing some meds.  He Was febrile over the weekend and his blood cultures show 1/4 bottles GPC, possibly contaminant. He has frequent urination and occasional dry cough. CXR unremarkable abut UA is positive for nitrites, rare bacteria. Urine cultures and repeat Blood cultures ordered, if they are negative will discontinue antibiotics in the next 24 to 48 hours.   Pt seen and examined at bedside, no new complaints.  Oral intake is remained poor.  Case discussed with registered dietitian.  Patient not meeting his nutritional goals and has been intermittently refusing medications and multivitamins.  Also intermittently refusing supplemental shakes.  In order to meet nutritional goals patient will likely need a PEG tube.  We will continue to encourage patient to eat and discuss possibility of enteral feeding.  7/27: Palliative care reconsulted to discuss goals of care.  After lengthy discussion with the patient and patient agreed  that he was interested in continuing aggressive treatment and at this point not entertaining de-escalation of care or comfort measures.  Stressed the importance of medication adherence and oral intake.   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 Neurosurgery consulted recommended not a surgical candidate Plan: Oncology following for inpatient cancer related needs. Patient has been intermittently refusing cancer related medications after repeated discussions he agrees to take the prescribed medications He appears fatigued    Severe symptomatic anemia with folic acid deficiency 123456 Hb 7.0, transfused 1 unit of PRBC 7/24 Hb 8.1 stable  Monitor H&H Transfuse to keep hemoglobin greater than 7.   Hypomagnesemia, mag repleted.  Monitor and replete as needed.   Hyponatremia Likely multifactorial secondary to possible SIADH versus hypovolemia Appears chronic and asymptomatic Plan: Fluid restriction and sodium tabs have been discontinued Encourage PO intake     Hypokalemia, resolved non anion gap metabolic acidosis, [id number] started po bicarb 650 TID x 4 days       Cancer cachexia with severe protein calorie malnutrition Dietary on board recommend encouraging oral intake. Patient has been intermittently refusing  multivitamins and protein supplements.  Not meeting his nutritional goals.  Has adamantly refused PEG tube  placement.  Has agreed to attempt to eat more.  Remove all dietary restrictions.   Left heel pain: X rays of the foot ordered to evaluate for pathological fractures. Negative. Increased oxycodone 1 to 2 pill every 6 hours prn.   Hypotension: Asymptomatic, keep MAP greater than 65, on Midodrine 10 mg po TID Has been taking midodrine intermittently.  When he takes his blood pressure is stable Ensure medication adherence     Fever: No evidence of infection.  No indication for antibiotics   DVT prophylaxis: SQ Lovenox Code Status: Full Family Communication: [name] [phone] at bedside on 7/27 disposition Plan: Status is: Inpatient  Remains inpatient appropriate because:Inpatient level of care appropriate due to severity of illness  Dispo: The patient is from: Home              Anticipated d/c is to:  TBD              Patient currently is medically stable to d/c.   Difficult to place patient Yes  Unclear disposition plan at this time.  Extended hospitalization.  Severely malnourished.  Not meeting nutritional goals.  Attempting to eat more.  Difficult placement     Level of care: Med-Surg  Consultants:  None  Procedures: None  Antimicrobials:  None   Subjective: Seen and examined.  Appears fatigued this morning.  Objective: Vitals:   05/07/21 1542 05/07/21 2135 05/08/21 0543 05/08/21 0841  BP: (!) 98/52 100/60 97/60 (!) 86/60  Pulse: 99 94 93 97  Resp:  '17 15 16  '$ Temp:  98.4 F (36.9 C) 98.6 F (37 C) 97.7 F (36.5 C)  TempSrc:      SpO2:  99% 99% 98%  Weight:      Height:        Intake/Output Summary (Last 24 hours) at 05/08/2021 1136 Last data filed at 05/08/2021 0528 Gross per 24 hour  Intake --  Output 600 ml  Net -600 ml    Filed Weights   04/08/21 0800 04/14/21 1246 05/04/21 0846  Weight: 41.6 kg 51.5 kg 46.8 kg    Examination:  General exam: No acute distress.  Thin and frail.  Fatigued Respiratory system: Clear to  auscultation. Respiratory effort normal. Cardiovascular system: S1-S2, regular rate and rhythm, no murmurs, no pedal edema gastrointestinal system: Scaphoid, nontender, nondistended, hyperactive bowel sounds Central nervous system: Alert and oriented.  No focal deficits Extremities: Diffusely decreased power bilaterally Skin: No rashes, lesions or ulcers Psychiatry: Judgement and insight appear normal. Mood & affect appropriate.     Data Reviewed: I have personally reviewed following labs and imaging studies  CBC: Recent Labs  Lab 05/02/21 0426 05/03/21 0412 05/07/21 0523  WBC 7.2 8.1 8.0  NEUTROABS  --   --  6.0  HGB 8.1* 7.9* 7.5*  HCT 24.0* 23.8* 22.7*  MCV 80.3 79.1* 81.4  PLT 209 227 123456   Basic Metabolic Panel: Recent Labs  Lab 05/02/21 0426 05/03/21 0412 05/07/21 0523  NA 125* 127* 129*  K 4.5 4.4 4.6  CL 100 102 103  CO2 18* 19* 20*  GLUCOSE 86 116* 136*  BUN '10 11 9  '$ CREATININE 0.44* 0.53* 0.48*  CALCIUM 8.2* 7.8* 6.9*   GFR: Estimated Creatinine Clearance: 65.8 mL/min (A) (by C-G formula based on SCr of 0.48 mg/dL (L)). Liver Function Tests: No results for input(s): AST, ALT, ALKPHOS, BILITOT, PROT, ALBUMIN  in the last 168 hours. No results for input(s): LIPASE, AMYLASE in the last 168 hours. No results for input(s): AMMONIA in the last 168 hours. Coagulation Profile: No results for input(s): INR, PROTIME in the last 168 hours. Cardiac Enzymes: No results for input(s): CKTOTAL, CKMB, CKMBINDEX, TROPONINI in the last 168 hours. BNP (last 3 results) No results for input(s): PROBNP in the last 8760 hours. HbA1C: No results for input(s): HGBA1C in the last 72 hours. CBG: No results for input(s): GLUCAP in the last 168 hours. Lipid Profile: No results for input(s): CHOL, HDL, LDLCALC, TRIG, CHOLHDL, LDLDIRECT in the last 72 hours. Thyroid Function Tests: No results for input(s): TSH, T4TOTAL, FREET4, T3FREE, THYROIDAB in the last 72 hours. Anemia  Panel: No results for input(s): VITAMINB12, FOLATE, FERRITIN, TIBC, IRON, RETICCTPCT in the last 72 hours. Sepsis Labs: No results for input(s): PROCALCITON, LATICACIDVEN in the last 168 hours.  No results found for this or any previous visit (from the past 240 hour(s)).        Radiology Studies: No results found.      Scheduled Meds:  enoxaparin (LOVENOX) injection  40 mg Subcutaneous Q24H   feeding supplement  237 mL Oral BID BM   ferrous sulfate  325 mg Oral Q breakfast   folic acid  1 mg Oral Daily   melatonin  5 mg Oral QHS   midodrine  10 mg Oral TID WC   mirtazapine  15 mg Oral QHS   polyethylene glycol  17 g Oral Daily   Vitamin D (Ergocalciferol)  50,000 Units Oral Q7 days   Continuous Infusions:     LOS: 39 days    Time spent: 15 minutes    Sidney Ace, MD [facility] Pager [phone]  If 7PM-7AM, please contact night-coverage 05/08/2021, 11:36 AM

## 2021-05-09 MED ORDER — DEXAMETHASONE 4 MG PO TABS
4.0000 mg | ORAL_TABLET | Freq: Every day | ORAL | Status: DC
  Administered 2021-05-10 – 2021-06-02 (×24): 4 mg via ORAL
  Filled 2021-05-09 (×24): qty 1

## 2021-05-09 NOTE — Progress Notes (Signed)
PROGRESS NOTE    Ryan [name]  [id number] DOB: 02/17/1961 DOA: 03/30/2021 PCP: Merryl Hacker, No    Brief Narrative:  Ryan [name] is a 60 y.o. male with medical history significant for history of polysubstance abuse including THC, cocaine, alcohol, remote history of tobacco use, history of right proximal femur inter trochanteric fracture status post right IM nail intertrochanteric fixation in 04/02/2021, does not follow-up with a PCP, presents to the emergency department for chief concerns of right hip pain.  Pain is chronic for several months, denies any trauma, difficulty ambulation due to pain. He also endorses an unintentional 35 pound weight loss in the last year, denies bright red blood per rectum and melena. patient was found to have L5 compression fracture, L3/4 severe stenosis.  She was also found to have widespread bony metastasis  from prostate cancer. Patient is a followed by oncology.  He needed SNF placement, not able to find accepting facility. He is intermittently refusing some meds.  He Was febrile over the weekend and his blood cultures show 1/4 bottles GPC, possibly contaminant. He has frequent urination and occasional dry cough. CXR unremarkable abut UA is positive for nitrites, rare bacteria. Urine cultures and repeat Blood cultures ordered, if they are negative will discontinue antibiotics in the next 24 to 48 hours.   Pt seen and examined at bedside, no new complaints.  Oral intake is remained poor.  Case discussed with registered dietitian.  Patient not meeting his nutritional goals and has been intermittently refusing medications and multivitamins.  Also intermittently refusing supplemental shakes.  In order to meet nutritional goals patient will likely need a PEG tube.  We will continue to encourage patient to eat and discuss possibility of enteral feeding.  7/27: Palliative care reconsulted to discuss goals of care.  After lengthy discussion with the patient and patient agreed  that he was interested in continuing aggressive treatment and at this point not entertaining de-escalation of care or comfort measures.  Stressed the importance of medication adherence and oral intake.   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 Neurosurgery consulted recommended not a surgical candidate Plan: Oncology following for inpatient cancer related needs. Patient has been intermittently refusing cancer related medications after repeated discussions he agrees to take the prescribed medications He appears fatigued Appetite remains poor.  We will add Decadron 4 mg daily.  Continue Remeron    Severe symptomatic anemia with folic acid deficiency 123456 Hb 7.0, transfused 1 unit of PRBC 7/24 Hb 8.1 stable  Monitor H&H Transfuse to keep hemoglobin greater than 7.    Hypomagnesemia, mag repleted.  Monitor and replete as needed.    Hyponatremia Likely multifactorial secondary to possible SIADH versus hypovolemia Appears chronic and asymptomatic Plan: Fluid restriction and sodium tabs have been discontinued Encourage PO intake     Hypokalemia, resolved non anion gap metabolic acidosis, 99991111 started po bicarb 650 TID x 4 days     Cancer cachexia with severe  protein calorie malnutrition Dietary on board recommend encouraging oral intake. Patient has been intermittently refusing multivitamins and protein  supplements.  Not meeting his nutritional goals.  Has adamantly refused PEG tube placement.  Has agreed to attempt to eat more.  Remove all dietary restrictions.  Continue Decadron and Remeron    Left heel pain X rays of the foot ordered to evaluate for pathological fractures. Negative. Increased oxycodone 1 to 2 pill every 6 hours prn.   Hypotension: Asymptomatic, keep MAP greater than 65, on Midodrine 10 mg po TID Has been taking midodrine intermittently.  When he takes his blood pressure is stable Ensure medication adherence     Fever: No evidence of infection.  No indication for antibiotics   DVT prophylaxis: SQ Lovenox Code Status: Full Family Communication: [name] [phone] at bedside on 7/31 disposition Plan: Status is: Inpatient  Remains inpatient appropriate because:Inpatient level of care appropriate due to severity of illness  Dispo: The patient is from: Home              Anticipated d/c is to:  TBD              Patient currently is medically stable to d/c.   Difficult to place patient Yes  Unclear disposition plan at this time.  Extended hospitalization.  Severely malnourished.  Not meeting nutritional goals.  Attempting to eat more.  Difficult placement     Level of care: Med-Surg  Consultants:  None  Procedures: None  Antimicrobials:  None   Subjective: Seen and examined.  Appears fatigued this morning.  Objective: Vitals:   05/08/21 1214 05/08/21 2006 05/09/21 0525 05/09/21 0917  BP: 102/61 106/62 103/62 97/63  Pulse: (!) 104 (!) 102 (!) 106 (!) 105  Resp: '16 18 14   '$ Temp: 98.5 F (36.9 C) 99 F (37.2 C) 98.5 F (36.9 C) 97.8 F (36.6 C)  TempSrc:   Oral Oral  SpO2: 100% 98% 100%   Weight:      Height:        Intake/Output Summary (Last 24 hours) at 05/09/2021 1109 Last data filed at 05/09/2021 0130 Gross per 24 hour  Intake --  Output 200 ml  Net -200 ml    Filed Weights   04/08/21 0800 04/14/21 1246 05/04/21  0846  Weight: 41.6 kg 51.5 kg 46.8 kg    Examination:  General exam: No acute distress.  Thin and frail.  Fatigued Respiratory system: Clear to auscultation. Respiratory effort normal. Cardiovascular system: S1-S2, regular rate and rhythm, no murmurs, no pedal edema gastrointestinal system: Scaphoid, nontender, nondistended, hyperactive bowel sounds Central nervous system: Alert and oriented.  No focal deficits Extremities: Diffusely decreased power bilaterally Skin: No rashes, lesions or ulcers Psychiatry: Judgement and insight appear normal. Mood & affect appropriate.     Data Reviewed: I have personally reviewed following labs and imaging studies  CBC: Recent Labs  Lab 05/03/21 0412 05/07/21 0523  WBC 8.1 8.0  NEUTROABS  --  6.0  HGB 7.9* 7.5*  HCT 23.8* 22.7*  MCV 79.1* 81.4  PLT 227 123456   Basic Metabolic Panel: Recent Labs  Lab 05/03/21 0412 05/07/21 0523  NA 127* 129*  K 4.4 4.6  CL 102 103  CO2 19* 20*  GLUCOSE 116* 136*  BUN 11 9  CREATININE 0.53* 0.48*  CALCIUM 7.8* 6.9*   GFR: Estimated Creatinine Clearance: 65.8 mL/min (A) (by C-G formula based on SCr of 0.48 mg/dL (L)). Liver Function Tests: No results for input(s):  AST, ALT, ALKPHOS, BILITOT, PROT, ALBUMIN in the last 168 hours. No results for input(s): LIPASE, AMYLASE in the last 168 hours. No results for input(s): AMMONIA in the last 168 hours. Coagulation Profile: No results for input(s): INR, PROTIME in the last 168 hours. Cardiac Enzymes: No results for input(s): CKTOTAL, CKMB, CKMBINDEX, TROPONINI in the last 168 hours. BNP (last 3 results) No results for input(s): PROBNP in the last 8760 hours. HbA1C: No results for input(s): HGBA1C in the last 72 hours. CBG: No results for input(s): GLUCAP in the last 168 hours. Lipid Profile: No results for input(s): CHOL, HDL, LDLCALC, TRIG, CHOLHDL, LDLDIRECT in the last 72 hours. Thyroid Function Tests: No results for input(s): TSH, T4TOTAL,  FREET4, T3FREE, THYROIDAB in the last 72 hours. Anemia Panel: No results for input(s): VITAMINB12, FOLATE, FERRITIN, TIBC, IRON, RETICCTPCT in the last 72 hours. Sepsis Labs: No results for input(s): PROCALCITON, LATICACIDVEN in the last 168 hours.  No results found for this or any previous visit (from the past 240 hour(s)).        Radiology Studies: No results found.      Scheduled Meds:  dexamethasone  4 mg Oral Daily   enoxaparin (LOVENOX) injection  40 mg Subcutaneous Q24H   feeding supplement  237 mL Oral BID BM   ferrous sulfate  325 mg Oral Q breakfast   folic acid  1 mg Oral Daily   melatonin  5 mg Oral QHS   midodrine  10 mg Oral TID WC   mirtazapine  15 mg Oral QHS   polyethylene glycol  17 g Oral Daily   Vitamin D (Ergocalciferol)  50,000 Units Oral Q7 days   Continuous Infusions:     LOS: 40 days    Time spent: 15 minutes    Sidney Ace, MD [facility] Pager [phone]  If 7PM-7AM, please contact night-coverage 05/09/2021, 11:09 AM

## 2021-05-10 LAB — BASIC METABOLIC PANEL
Anion gap: 8 (ref 5–15)
BUN: 12 mg/dL (ref 6–20)
CO2: 19 mmol/L — ABNORMAL LOW (ref 22–32)
Calcium: 8 mg/dL — ABNORMAL LOW (ref 8.9–10.3)
Chloride: 104 mmol/L (ref 98–111)
Creatinine, Ser: 0.48 mg/dL — ABNORMAL LOW (ref 0.61–1.24)
GFR, Estimated: >60 mL/min (ref >60)
Glucose, Bld: 108 mg/dL — ABNORMAL HIGH (ref 70–99)
Potassium: 4.2 mmol/L (ref 3.5–5.1)
Sodium: 131 mmol/L — ABNORMAL LOW (ref 135–145)

## 2021-05-10 LAB — CBC WITH DIFFERENTIAL/PLATELET
Abs Immature Granulocytes: 0.21 10*3/uL — ABNORMAL HIGH (ref 0.00–0.07)
Basophils Absolute: 0 10*3/uL (ref 0.0–0.1)
Basophils Relative: 0 %
Eosinophils Absolute: 0.1 10*3/uL (ref 0.0–0.5)
Eosinophils Relative: 1 %
HCT: 22 % — ABNORMAL LOW (ref 39.0–52.0)
Hemoglobin: 7.3 g/dL — ABNORMAL LOW (ref 13.0–17.0)
Immature Granulocytes: 3 %
Lymphocytes Relative: 20 %
Lymphs Abs: 1.6 10*3/uL (ref 0.7–4.0)
MCH: 26.8 pg (ref 26.0–34.0)
MCHC: 33.2 g/dL (ref 30.0–36.0)
MCV: 80.9 fL (ref 80.0–100.0)
Monocytes Absolute: 0.8 10*3/uL (ref 0.1–1.0)
Monocytes Relative: 10 %
Neutro Abs: 5.2 10*3/uL (ref 1.7–7.7)
Neutrophils Relative %: 66 %
Platelets: 316 10*3/uL (ref 150–400)
RBC: 2.72 MIL/uL — ABNORMAL LOW (ref 4.22–5.81)
RDW: 16.6 % — ABNORMAL HIGH (ref 11.5–15.5)
WBC: 7.9 10*3/uL (ref 4.0–10.5)
nRBC: 0 % (ref 0.0–0.2)

## 2021-05-10 NOTE — Progress Notes (Signed)
Daily Progress Note   Patient Name: Ryan [name]       Date: 05/10/2021 DOB: 1961-04-14  Age: 60 y.o. MRN#: [id number] Attending Physician: Sidney Ace, MD Primary Care Physician: Pcp, No Admit Date: 03/30/2021  Reason for Consultation/Follow-up: Establishing goals of care  Subjective: Chart Reviewed. Updates Received. Patient Assessed.   Patient sitting up in recliner. Denies pain or shortness of breath. No acute distress noted. States he feels much better being out of the bed in the chair but that it "wore him outAvnet he had an episode of bowel incontinence earlier in the bed.   We discussed his continued poor nutrition. He verbalized understanding stating he feels his appetite has improved some and he is still remaining hopeful. He continues to refuse PEG. He is aware and verbalizes his understanding of poor long-term prognosis due to his severe malnutrition if no significant improvement.   Patient clear he is remaining hopeful but is starting to prepare for what may be to come including end-of-life or no improvement/further decline.   Ryan [name] states he is hopeful he can go to rehab and continue with all efforts. He is tearful sharing "it's not the cancer that is going to kill me it is probably going to be everything else!" Emotional support provided and opportunity created to allow patient to express feelings.   All questions answered and support provided.    Length of Stay: 41 days  Vital Signs: BP 102/64 (BP Location: Left Leg)   Pulse 88   Temp 97.9 F (36.6 C)   Resp 18   Ht _0  (1.956 m)   Wt 46.8 kg   SpO2 100%   BMI 12.23 kg/m  SpO2: SpO2: 100 % O2 Device: O2 Device: Room Air O2 Flow Rate: O2 Flow Rate (L/min): 2 L/min  Physical Exam: NAD, sitting up in recliner, cachectic RRR Clear bilaterally Muscle/temporal wasting  AAO x3, mood appropriate     Palliative Care Assessment & Plan  HPI: Palliative Care consult requested for goals of care  discussion in this 60 y.o. male with a medical history significant for polysubstance abuse including THC, cocaine, alcohol, remote history of tobacco use, history of right proximal femur inter trochanteric fracture status post right IM nail intertrochanteric fixation (04/02/2020) and rib fractures s/p mechanical fall and lying on the floor x2 days. He presented to the ED from home with complaints of right hip pain. Per notations patient reports pain has been ongoing for several months. During work-up hemoglobin 5.6. Alk phos 884. He received 2 units Packed Red blood cells. CT abdomen pelvis showed diffuse skeletal metastatic disease, pathological right-sided rib fractures. Oncology following and treating in the setting of PSA greater than 3000. 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 Patient started on [place] and [place].    Code Status: DNR  Goals of Care/Recommendations: DNR/DNI Continue to treat the treatable as requested by patient. He is realistic in expressing his current illness and co-morbidities. Is remaining hopeful for placement and increase in his nutrition. Open an direct regarding poor long-term prognosis if not improvement. Continues to refuse PEG.  PMT will continue to follow peripherally and engage intermittently. Please call with urgent needs as goals are set.   Prognosis: Guarded-Poor   Discharge Planning: To Be Determined currently having difficulty to place. DSS is working with [facility], patient is unable to return home independently.   Thank you for allowing the Palliative Medicine Team to assist in the care of this patient.  Time Total: 25 min   Visit consisted of counseling and education dealing with the complex and emotionally intense issues of symptom management and palliative care in the setting of serious and potentially life-threatening illness.Greater than 50%  of this time was spent counseling and coordinating care related to the above  assessment and plan.  Alda Lea, AGPCNP-BC  Palliative Medicine Team [phone]

## 2021-05-10 NOTE — Progress Notes (Signed)
PROGRESS NOTE    Ryan [name]  [id number] DOB: 1960/12/08 DOA: 03/30/2021 PCP: Merryl Hacker, No    Brief Narrative:  Ryan [name] is a 60 y.o. male with medical history significant for history of polysubstance abuse including THC, cocaine, alcohol, remote history of tobacco use, history of right proximal femur inter trochanteric fracture status post right IM nail intertrochanteric fixation in 04/02/2021, does not follow-up with a PCP, presents to the emergency department for chief concerns of right hip pain.  Pain is chronic for several months, denies any trauma, difficulty ambulation due to pain. He also endorses an unintentional 35 pound weight loss in the last year, denies bright red blood per rectum and melena. patient was found to have L5 compression fracture, L3/4 severe stenosis.  She was also found to have widespread bony metastasis  from prostate cancer. Patient is a followed by oncology.  He needed SNF placement, not able to find accepting facility. He is intermittently refusing some meds.  He Was febrile over the weekend and his blood cultures show 1/4 bottles GPC, possibly contaminant. He has frequent urination and occasional dry cough. CXR unremarkable abut UA is positive for nitrites, rare bacteria. Urine cultures and repeat Blood cultures ordered, if they are negative will discontinue antibiotics in the next 24 to 48 hours.   Pt seen and examined at bedside, no new complaints.  Oral intake is remained poor.  Case discussed with registered dietitian.  Patient not meeting his nutritional goals and has been intermittently refusing medications and multivitamins.  Also intermittently refusing supplemental shakes.  In order to meet nutritional goals patient will likely need a PEG tube.  We will continue to encourage patient to eat and discuss possibility of enteral feeding.  7/27: Palliative care reconsulted to discuss goals of care.  After lengthy discussion with the patient and patient agreed  that he was interested in continuing aggressive treatment and at this point not entertaining de-escalation of care or comfort measures.  Stressed the importance of medication adherence and oral intake.  8/1: Patient's intake is remained poor.  His energy level is also been decreased over the past couple days.  Discussed with oncology, notified on patient's status.  Initiated Decadron 4 mg p.o. daily in an effort to improve this patient's appetite.   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 Neurosurgery consulted recommended not a surgical candidate Plan: Oncology following for inpatient cancer related needs. Patient has been intermittently refusing cancer related medications after repeated discussions he agrees to take the prescribed medications He appears fatigued Plan: Oncology following Continue Remeron and Decadron for appetite stimulation    Severe symptomatic anemia with folic acid deficiency 123456 Hb 7.0, transfused 1 unit of PRBC 7/24 Hb 8.1 stable  Monitor H&H Transfuse to keep hemoglobin greater than 7.    Hypomagnesemia, mag repleted.  Monitor and replete as needed.    Hyponatremia Likely multifactorial secondary to possible SIADH versus hypovolemia Appears chronic and asymptomatic Plan:  Fluid restriction and sodium tabs have been discontinued Encourage PO intake     Hypokalemia, resolved non  anion gap metabolic acidosis, 99991111 started po bicarb 650 TID x 4 days     Cancer cachexia with severe protein calorie malnutrition Dietary on board recommend encouraging oral intake. Patient has been intermittently refusing multivitamins and protein supplements.  Not meeting his nutritional goals.  Has adamantly refused PEG tube placement.  Has agreed to attempt to eat more.  Remove all dietary restrictions.  Continue Decadron and Remeron    Left heel pain X rays of the foot ordered to evaluate for pathological fractures. Negative. Increased oxycodone 1 to 2 pill every 6 hours prn.   Hypotension: Asymptomatic, keep MAP greater than 65, on Midodrine 10 mg po TID Has been taking midodrine intermittently.  When he takes his blood pressure is stable Ensure medication adherence     Fever: No evidence of infection.  No indication for antibiotics   DVT prophylaxis: SQ Lovenox Code Status: Full Family Communication: Ryan [name] [phone] on 7/31 disposition Plan: Status is: Inpatient  Remains inpatient appropriate because:Inpatient level of care appropriate due to severity of illness  Dispo: The patient is from: Home              Anticipated d/c is to:  TBD              Patient currently is medically stable to d/c.   Difficult to place patient Yes  Unclear disposition plan at this time.  Extended hospitalization.  Severely malnourished.  Not meeting nutritional goals.  Attempting to eat more.  Difficult placement.  Has refused enteral feeding in the past     Level of care: Med-Surg  Consultants:  None  Procedures: None  Antimicrobials:  None   Subjective: Seen and examined.  Appears fatigued this morning.  Objective: Vitals:   05/09/21 1611 05/09/21 2020 05/10/21 0437 05/10/21 0800  BP: (!) 87/58 (!) 103/58 (!) 81/44 98/63  Pulse: 95 86 96 100  Resp: '18 16 18 20  '$ Temp: (!) 97 F (36.1 C) 98.6 F (37 C) 98.6 F (37 C) (!) 97.3 F (36.3 C)   TempSrc:  Oral Oral   SpO2: 100% 100% 98% 100%  Weight:      Height:       No intake or output data in the 24 hours ending 05/10/21 1126   Filed Weights   04/08/21 0800 04/14/21 1246 05/04/21 0846  Weight: 41.6 kg 51.5 kg 46.8 kg    Examination:  General exam: No acute distress.  Thin and frail.  Fatigued Respiratory system: Clear to auscultation. Respiratory effort normal. Cardiovascular system: S1-S2, regular rate and rhythm, no murmurs, no pedal edema gastrointestinal system: Scaphoid, nontender, nondistended, hyperactive bowel sounds Central nervous system: Alert and oriented.  No focal deficits Extremities: Diffusely decreased power bilaterally Skin: No rashes, lesions or ulcers Psychiatry: Judgement and insight appear normal. Mood & affect appropriate.     Data Reviewed: I have personally reviewed following labs and imaging studies  CBC: Recent Labs  Lab 05/07/21 0523 05/10/21 0507  WBC 8.0 7.9  NEUTROABS 6.0 5.2  HGB 7.5* 7.3*  HCT 22.7* 22.0*  MCV 81.4 80.9  PLT 255 [id number]   Basic Metabolic Panel: Recent Labs  Lab 05/07/21 0523 05/10/21 0507  NA 129* 131*  K 4.6 4.2  CL 103 104  CO2 20* 19*  GLUCOSE 136* 108*  BUN 9 12  CREATININE 0.48* 0.48*  CALCIUM 6.9* 8.0*  GFR: Estimated Creatinine Clearance: 65.8 mL/min (A) (by C-G formula based on SCr of 0.48 mg/dL (L)). Liver Function Tests: No results for input(s): AST, ALT, ALKPHOS, BILITOT, PROT, ALBUMIN in the last 168 hours. No results for input(s): LIPASE, AMYLASE in the last 168 hours. No results for input(s): AMMONIA in the last 168 hours. Coagulation Profile: No results for input(s): INR, PROTIME in the last 168 hours. Cardiac Enzymes: No results for input(s): CKTOTAL, CKMB, CKMBINDEX, TROPONINI in the last 168 hours. BNP (last 3 results) No results for input(s): PROBNP in the last 8760 hours. HbA1C: No results for input(s): HGBA1C in the last 72 hours. CBG: No results for input(s): GLUCAP  in the last 168 hours. Lipid Profile: No results for input(s): CHOL, HDL, LDLCALC, TRIG, CHOLHDL, LDLDIRECT in the last 72 hours. Thyroid Function Tests: No results for input(s): TSH, T4TOTAL, FREET4, T3FREE, THYROIDAB in the last 72 hours. Anemia Panel: No results for input(s): VITAMINB12, FOLATE, FERRITIN, TIBC, IRON, RETICCTPCT in the last 72 hours. Sepsis Labs: No results for input(s): PROCALCITON, LATICACIDVEN in the last 168 hours.  No results found for this or any previous visit (from the past 240 hour(s)).        Radiology Studies: No results found.      Scheduled Meds:  dexamethasone  4 mg Oral Daily   enoxaparin (LOVENOX) injection  40 mg Subcutaneous Q24H   feeding supplement  237 mL Oral BID BM   ferrous sulfate  325 mg Oral Q breakfast   folic acid  1 mg Oral Daily   melatonin  5 mg Oral QHS   midodrine  10 mg Oral TID WC   mirtazapine  15 mg Oral QHS   polyethylene glycol  17 g Oral Daily   Vitamin D (Ergocalciferol)  50,000 Units Oral Q7 days   Continuous Infusions:     LOS: 41 days    Time spent: 15 minutes    Sidney Ace, MD [facility] Pager [phone]  If 7PM-7AM, please contact night-coverage 05/10/2021, 11:26 AM

## 2021-05-10 NOTE — Progress Notes (Addendum)
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 TLSO recommended from nurosurgery and devliered to room.    PT Comments    Pt seen for PT tx with pt requiring max encouragement for participation & OOB mobility. Pt is able to complete bed mobility with supervision with bed rails & HOB slightly elevated. Pt notes incontinent BM following continent void into urinal. PT encourages pt to call for assistance to get to [facility] in future.  Pt is able to stand with RW to allow PT to perform peri hygiene but pt stands & performs anterior hygiene with supervision for standing balance. Pt then completes standing exercises with instructional cuing for technique with minimal hip flexion ROM. Pt left in recliner & encouraged to sit up more often. Pt appreciative of session. Pt on room air with SPO2 100% - nurse notified.   Addendum: PT goals remain appropriate at this time. Goal dates were updated. Continue POC.    Follow Up Recommendations  SNF     Equipment Recommendations  3in1 (PT);Wheelchair cushion (measurements PT);Rolling walker with 5" wheels;Wheelchair (measurements PT);Hospital bed    Recommendations for Other Services       Precautions / Restrictions Precautions Precautions: Fall Required Braces or Orthoses: Spinal Brace;Other Brace Spinal Brace: Thoracolumbosacral orthotic Spinal Brace Comments: for comfort Other Brace: LLE post op shoe Restrictions Weight Bearing Restrictions: No    Mobility  Bed Mobility Overal bed  mobility: Needs Assistance Bed Mobility: Supine to Sit     Supine to sit: Supervision;HOB elevated (bed rails)          Transfers Overall transfer level: Needs assistance Equipment used: Rolling walker (2 wheeled) Transfers: Sit to/from [facility] Sit to Stand: Min assist Stand pivot transfers: Min guard       General transfer comment: min assist to power up to standing during sit>stand  Ambulation/Gait                 Stairs             Wheelchair Mobility    Modified Rankin (Stroke Patients Only)       Balance Overall balance assessment: Needs assistance Sitting-balance support: Feet supported;Bilateral upper extremity supported Sitting balance-Leahy Scale: Good     Standing balance support: Single extremity supported;During functional activity Standing balance-Leahy Scale: Fair                              Cognition Arousal/Alertness: Awake/alert Behavior During Therapy: Flat affect Overall Cognitive Status: Within Functional Limits for tasks assessed                                 General Comments: Max encouragement for participation      Exercises General Exercises - Lower Extremity Hip Flexion/Marching: AROM;Strengthening;Both;10 reps;Standing (BUE support on RW)    General Comments General comments (skin integrity, edema, etc.): Pt on room air throughout session, SPO2 100% at end of session sitting in recliner & nurse made aware  Pertinent Vitals/Pain Pain Assessment: Faces Faces Pain Scale: Hurts a little bit Pain Location: generalized Pain Descriptors / Indicators: Discomfort;Grimacing Pain Intervention(s): Monitored during session    Home Living                      Prior Function            PT Goals (current goals can now be found in the care plan section) Acute Rehab PT Goals Patient Stated Goal: to get stronger PT Goal Formulation: With patient Time For Goal  Achievement: 05/24/21 Potential to Achieve Goals: Fair Progress towards PT goals: Progressing toward goals    Frequency    Min 2X/week      PT Plan Current plan remains appropriate    Co-evaluation              AM-PAC PT "6 Clicks" Mobility   Outcome Measure  Help needed turning from your back to your side while in a flat bed without using bedrails?: A Little Help needed moving from lying on your back to sitting on the side of a flat bed without using bedrails?: A Little Help needed moving to and from a bed to a chair (including a wheelchair)?: A Little Help needed standing up from a chair using your arms (e.g., wheelchair or bedside chair)?: A Little Help needed to walk in hospital room?: A Little Help needed climbing 3-5 steps with a railing? : A Lot 6 Click Score: 17    End of Session   Activity Tolerance: Patient tolerated treatment well Patient left: in chair;with call bell/phone within reach;with chair alarm set Nurse Communication: Mobility status PT Visit Diagnosis: Unsteadiness on feet (R26.81);Muscle weakness (generalized) (M62.81);Difficulty in walking, not elsewhere classified (R26.2)     Time: [id number] PT Time Calculation (min) (ACUTE ONLY): 25 min  Charges:  $Therapeutic Activity: 23-37 mins                     [name], PT, DPT 05/10/21, 11:46 AM    [name] 05/10/2021, 11:42 AM

## 2021-05-11 MED ORDER — ENSURE ENLIVE PO LIQD
237.0000 mL | Freq: Three times a day (TID) | ORAL | Status: DC
  Administered 2021-05-11 – 2021-06-02 (×65): 237 mL via ORAL

## 2021-05-11 NOTE — Progress Notes (Signed)
PROGRESS NOTE    Ryan [name]  [id number] DOB: 07-03-1961 DOA: 03/30/2021 PCP: Merryl Hacker, No    Brief Narrative:  Ryan [name] is a 60 y.o. male with medical history significant for history of polysubstance abuse including THC, cocaine, alcohol, remote history of tobacco use, history of right proximal femur inter trochanteric fracture status post right IM nail intertrochanteric fixation in 04/02/2021, does not follow-up with a PCP, presents to the emergency department for chief concerns of right hip pain.  Pain is chronic for several months, denies any trauma, difficulty ambulation due to pain. He also endorses an unintentional 35 pound weight loss in the last year, denies bright red blood per rectum and melena. patient was found to have L5 compression fracture, L3/4 severe stenosis.  She was also found to have widespread bony metastasis  from prostate cancer. Patient is a followed by oncology.  He needed SNF placement, not able to find accepting facility. He is intermittently refusing some meds.  He Was febrile over the weekend and his blood cultures show 1/4 bottles GPC, possibly contaminant. He has frequent urination and occasional dry cough. CXR unremarkable abut UA is positive for nitrites, rare bacteria. Urine cultures and repeat Blood cultures ordered, if they are negative will discontinue antibiotics in the next 24 to 48 hours.   Pt seen and examined at bedside, no new complaints.  Oral intake is remained poor.  Case discussed with registered dietitian.  Patient not meeting his nutritional goals and has been intermittently refusing medications and multivitamins.  Also intermittently refusing supplemental shakes.  In order to meet nutritional goals patient will likely need a PEG tube.  We will continue to encourage patient to eat and discuss possibility of enteral feeding.  7/27: Palliative care reconsulted to discuss goals of care.  After lengthy discussion with the patient and patient agreed  that he was interested in continuing aggressive treatment and at this point not entertaining de-escalation of care or comfort measures.  Stressed the importance of medication adherence and oral intake.  8/1: Patient's intake is remained poor.  His energy level is also been decreased over the past couple days.  Discussed with oncology, notified on patient's status.  Initiated Decadron 4 mg p.o. daily in an effort to improve this patient's appetite.   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 Neurosurgery consulted recommended not a surgical candidate Plan: Oncology following for inpatient cancer related needs. Patient has been intermittently refusing cancer related medications after repeated discussions he agrees to take the prescribed medications He appears fatigued Plan: Oncology following Continue Remeron and Decadron for appetite stimulation Encourage p.o. intake is much as possible Intermittent follow-up with palliative care.  Patient has adamantly repeatedly refused PEG tube    Severe symptomatic anemia with folic acid deficiency [id number] Hb 7.0, transfused 1 unit of PRBC 7/24 Hb 8.1 stable  Monitor H&H Transfuse to keep hemoglobin greater than 7.    Hypomagnesemia, mag repleted.  Monitor and  replete as needed.    Hyponatremia Likely multifactorial secondary to possible SIADH versus hypovolemia Appears chronic  and asymptomatic Plan: Fluid restriction and sodium tabs have been discontinued Encourage PO intake     Hypokalemia, resolved non anion gap metabolic acidosis, [id number] started po bicarb 650 TID x 4 days     Cancer cachexia with severe protein calorie malnutrition Dietary on board recommend encouraging oral intake. Patient has been intermittently refusing multivitamins and protein supplements.  Not meeting his nutritional goals.  Has adamantly refused PEG tube placement.  Has agreed to attempt to eat more.  Remove all dietary restrictions.  Continue Decadron and Remeron    Left heel pain X rays of the foot ordered to evaluate for pathological fractures. Negative. Increased oxycodone 1 to 2 pill every 6 hours prn.   Hypotension: Asymptomatic, keep MAP greater than 65, on Midodrine 10 mg po TID Has been taking midodrine intermittently.  When he takes his blood pressure is stable Ensure medication adherence     Fever: No evidence of infection.  No indication for antibiotics   DVT prophylaxis: SQ Lovenox Code Status: Full Family Communication: [name] [phone] on 7/31 disposition Plan: Status is: Inpatient  Remains inpatient appropriate because:Inpatient level of care appropriate due to severity of illness  Dispo: The patient is from: Home              Anticipated d/c is to:  TBD              Patient currently is medically stable to d/c.   Difficult to place patient Yes  Unclear disposition plan at this time.  Extended hospitalization.  Severely malnourished.  Not meeting nutritional goals.  Attempting to eat more.  Difficult placement.  Has refused enteral feeding in the past.  Poor prognosis     Level of care: Med-Surg  Consultants:  None  Procedures: None  Antimicrobials:  None   Subjective: Seen and examined.  Appears fatigued this morning.  Objective: Vitals:   05/10/21 1824 05/10/21 2038 05/11/21 0333 05/11/21 0819  BP: 106/69 105/69  (!) 82/53 97/61  Pulse: 94 85 83 90  Resp: '16 20 18 18  '$ Temp: 98.1 F (36.7 C) 98.3 F (36.8 C) 97.9 F (36.6 C) (!) 97.4 F (36.3 C)  TempSrc: Oral Oral Oral Oral  SpO2: 100% 100% 100% 100%  Weight:      Height:        Intake/Output Summary (Last 24 hours) at 05/11/2021 1117 Last data filed at 05/10/2021 2144 Gross per 24 hour  Intake 240 ml  Output --  Net 240 ml     Filed Weights   04/08/21 0800 04/14/21 1246 05/04/21 0846  Weight: 41.6 kg 51.5 kg 46.8 kg    Examination:  General exam: No acute distress.  Thin and frail.  Fatigued Respiratory system: Clear to auscultation. Respiratory effort normal. Cardiovascular system: S1-S2, regular rate and rhythm, no murmurs, no pedal edema gastrointestinal system: Scaphoid, nontender, nondistended, hyperactive bowel sounds Central nervous system: Alert and oriented.  No focal deficits Extremities: Diffusely decreased power bilaterally Skin: No rashes, lesions or ulcers Psychiatry: Judgement and insight appear normal. Mood & affect appropriate.     Data Reviewed: I have personally reviewed following labs and imaging studies  CBC: Recent Labs  Lab 05/07/21 0523 05/10/21 0507  WBC 8.0 7.9  NEUTROABS 6.0 5.2  HGB 7.5* 7.3*  HCT 22.7* 22.0*  MCV 81.4 80.9  PLT 255 [id number]   Basic Metabolic Panel: Recent  Labs  Lab 05/07/21 0523 05/10/21 0507  NA 129* 131*  K 4.6 4.2  CL 103 104  CO2 20* 19*  GLUCOSE 136* 108*  BUN 9 12  CREATININE 0.48* 0.48*  CALCIUM 6.9* 8.0*   GFR: Estimated Creatinine Clearance: 65.8 mL/min (A) (by C-G formula based on SCr of 0.48 mg/dL (L)). Liver Function Tests: No results for input(s): AST, ALT, ALKPHOS, BILITOT, PROT, ALBUMIN in the last 168 hours. No results for input(s): LIPASE, AMYLASE in the last 168 hours. No results for input(s): AMMONIA in the last 168 hours. Coagulation Profile: No results for input(s): INR, PROTIME in the last 168 hours. Cardiac Enzymes: No results for  input(s): CKTOTAL, CKMB, CKMBINDEX, TROPONINI in the last 168 hours. BNP (last 3 results) No results for input(s): PROBNP in the last 8760 hours. HbA1C: No results for input(s): HGBA1C in the last 72 hours. CBG: No results for input(s): GLUCAP in the last 168 hours. Lipid Profile: No results for input(s): CHOL, HDL, LDLCALC, TRIG, CHOLHDL, LDLDIRECT in the last 72 hours. Thyroid Function Tests: No results for input(s): TSH, T4TOTAL, FREET4, T3FREE, THYROIDAB in the last 72 hours. Anemia Panel: No results for input(s): VITAMINB12, FOLATE, FERRITIN, TIBC, IRON, RETICCTPCT in the last 72 hours. Sepsis Labs: No results for input(s): PROCALCITON, LATICACIDVEN in the last 168 hours.  No results found for this or any previous visit (from the past 240 hour(s)).        Radiology Studies: No results found.      Scheduled Meds:  dexamethasone  4 mg Oral Daily   enoxaparin (LOVENOX) injection  40 mg Subcutaneous Q24H   feeding supplement  237 mL Oral BID BM   ferrous sulfate  325 mg Oral Q breakfast   folic acid  1 mg Oral Daily   melatonin  5 mg Oral QHS   midodrine  10 mg Oral TID WC   mirtazapine  15 mg Oral QHS   polyethylene glycol  17 g Oral Daily   Vitamin D (Ergocalciferol)  50,000 Units Oral Q7 days   Continuous Infusions:     LOS: 42 days    Time spent: 15 minutes    Sidney Ace, MD [facility] Pager [phone]  If 7PM-7AM, please contact night-coverage 05/11/2021, 11:17 AM

## 2021-05-11 NOTE — Progress Notes (Signed)
Nutrition Follow-up  DOCUMENTATION CODES:  Severe malnutrition in context of chronic illness, Underweight  INTERVENTION:  Encourage PO intake, continue current diet as ordered Continue double protein portions with meals. Continue Ensure Enlive TID, each supplement provides 350 kcal and 20 grams of protein . Continue snacks TID Request new measured weight be taken weekly  Pt would benefit from feeding tube placement and nocturnal feeds if agreeable.  NUTRITION DIAGNOSIS:  Severe Malnutrition (in the context of chronic illness) related to poor appetite as evidenced by severe muscle depletion, severe fat depletion.  - ongoing  GOAL:  Patient will meet greater than or equal to 90% of their needs  - not meeting  MONITOR:  PO intake, Weight trends  REASON FOR ASSESSMENT:  Consult Assessment of nutrition requirement/status  ASSESSMENT:  60 y.o. male presented to ED for intermittent right lateral hip pain that is been ongoing for the past several weeks. Pt had surgery 10 months ago but reports he has not followed-up for pain. Imaging in ED concerning for marrow infiltrative process. PMH relevant for polysubstance abuse.  Found to have prostate cancer with widespread bony metastasis. Pt a difficult placement so cancer treatment was initiated inpatient. Receiving Zometea and Firmagon. First treatement was 04/02/21.  Pt resting in bed at the time of visit. Reports that he is eating much better now. Noted lunch tray at bedside, not well consumed. Pt states he is saving it for later. Nurse reports that pt is drinking his ensure today and has been asking for items like graham crackers frequently throughout the day. Pt states that he is eating his snacks, had his AM snack for breakfast. Encouraged pt to consume his meals and the ensures in order to gain weight. Noted to have lost since last assessment. MD aware of pt's poor nutrition and has spoken to pt several times about the need to increase  oral intake. Pt refused feeding tube when discussed with MD.   Palliative re-engaged pt with [place] discussions and pt was made a DNR but still wants to continue all other aggressive care.   Average Meal Intake: 6/26-6/28: ~32% x 4 recorded meals (0-100%) 6/29-7/6: 48% intake x 6 recorded meals (15-90%) 7/7-7/12: 50% intake x 5 recorded meals (0-100%) 7/13-7/19: 30% intake x 2 recorded meals (30%) 7/20-7/26: 63% intake x 4 recorded meals (50-100%) 7/27-8/2: No meals recorded  Nutritionally Relevant Medications: Scheduled Meds:  dexamethasone  4 mg Oral Daily   feeding supplement  237 mL Oral BID BM   ferrous sulfate  325 mg Oral Q breakfast   folic acid  1 mg Oral Daily   midodrine  10 mg Oral TID WC   mirtazapine  15 mg Oral QHS   polyethylene glycol  17 g Oral Daily   Vitamin D (Ergocalciferol)  50,000 Units Oral Q7 days   PRN Meds: bisacodyl, ondansetron  Labs Reviewed  Diet Order:   Diet Order             Diet regular Room service appropriate? Yes; Fluid consistency: Thin  Diet effective now                  EDUCATION NEEDS:  Education needs have been addressed  Skin:  Skin Assessment: Reviewed RN Assessment  Last BM:  8/2  Height:  Ht Readings from Last 1 Encounters:  03/30/21 '6\' 5"'$  (1.956 m)   Weight:  Wt Readings from Last 1 Encounters:  05/11/21 43.3 kg   Ideal Body Weight:  94.5 kg  BMI:  Body mass index is 11.32 kg/m.  Estimated Nutritional Needs:  Kcal:  2400-2600 kcal/d Protein:  130-150 g/d Fluid:  >2.5L/d  Ranell Patrick, RD, LDN Clinical Dietitian Pager on [place]

## 2021-05-11 NOTE — Evaluation (Signed)
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 TLSO recommended from nurosurgery and devliered to room.   Clinical Impression   Upon entering the room, NT present assisting pt with getting to EOB secondary to need for BM. Pt is agreeable to OT intervention this session. Pt needing mod lifting assistance to stand from EOB and transfer to [facility]. Pt able to have BM and needing assistance for hygiene and clothing management secondary to fatigue. Pt returning back to bed in same manner and reports nausea and declined food tray. Lunch tray has not been touched by pt. Pt performs sit >supine with min guard and min A for repositioning in bed for comfort. Pt requests to rest in bed but is very appreciative for assistance from therapist. Pt reports he is "not giving up and I am going to get better and make it home after rehab". Goals remain appropriate for pt and set at supervision overall. All needs within reach.     Follow Up Recommendations  SNF    Equipment Recommendations  None recommended by OT       Precautions / Restrictions Precautions Precautions: Fall Required Braces or Orthoses: Spinal Brace;Other Brace Spinal Brace: Thoracolumbosacral orthotic Spinal Brace Comments: for comfort Other Brace: LLE post op shoe      Mobility Bed Mobility Overal bed mobility: Needs Assistance Bed Mobility: Supine to Sit;Sit to Supine Rolling: Supervision   Supine to sit: HOB elevated;Min guard Sit to supine: Min guard   General  bed mobility comments: cuing for safety    Transfers Overall transfer level: Needs assistance Equipment used: 1 person hand held assist Transfers: Sit to/from [facility] Sit to Stand: Min assist;Mod assist Stand pivot transfers: Mod assist       General transfer comment: Pt appears to have increased weakness this session    Balance Overall balance assessment: Needs assistance Sitting-balance support: Feet supported;Bilateral upper extremity supported Sitting balance-Leahy Scale: Good Sitting balance - Comments: while seated EOB and BSC   Standing balance support: Single extremity supported;During functional activity Standing balance-Leahy Scale: Fair Standing balance comment: requires UE support on RW                           ADL either performed or assessed with clinical judgement   ADL Overall ADL's : Needs assistance/impaired                         Toilet Transfer: BSC;Moderate assistance   Toileting- Clothing Manipulation and Hygiene: Sit to/from stand;Minimal assistance               Vision Baseline Vision/History: No visual deficits Patient Visual Report: No change from baseline              Pertinent Vitals/Pain Pain Assessment: Faces Faces Pain Scale: Hurts a little bit Pain Location: generalized Pain Descriptors / Indicators: Discomfort Pain Intervention(s): Limited activity within patient's tolerance;Repositioned              Cognition Arousal/Alertness: Awake/alert Behavior During Therapy: Flat  affect Overall Cognitive Status: Within Functional Limits for tasks assessed                                 General Comments: pt was cooperative and pleasant this session                        OT Goals(Current goals can be found in the care plan section) Acute Rehab OT Goals Patient Stated Goal: to get stronger OT Goal Formulation: With patient Time For Goal Achievement: 05/25/21   OT Frequency: Min 1X/week    AM-PAC OT "6 Clicks" Daily Activity     Outcome Measure Help from another person eating meals?: A Little Help from another person taking care of personal grooming?: None Help from another person toileting, which includes using toliet, bedpan, or urinal?: A Lot Help from another person bathing (including washing, rinsing, drying)?: A Lot Help from another person to put on and taking off regular upper body clothing?: A Little Help from another person to put on and taking off regular lower body clothing?: A Little 6 Click Score: 17   End of Session Nurse Communication: Mobility status  Activity Tolerance: Patient limited by fatigue Patient left: in bed;with call bell/phone within reach;with bed alarm set  OT Visit Diagnosis: Unsteadiness on feet (R26.81);Muscle weakness (generalized) (M62.81) Pain - Right/Left: Right                Time: [id number] OT Time Calculation (min): 24 min Charges:  OT General Charges $OT Visit: 1 Visit OT Evaluation $OT Re-eval: 1 Re-eval OT Treatments $Self Care/Home Management : 23-37 mins  Darleen Crocker, MS, OTR/L , CBIS ascom [phone]  05/11/21, 4:21 PM

## 2021-05-12 NOTE — Progress Notes (Signed)
    Chart Reviewed. Patient Assessed.   Arend is sitting up in bed. Denies pain or shortness of breath. No family at the bedside.   Discussed his continued poor intake. Patient states he has an appetite and feels as though it is improving day by day. States "things just aren't going to happen overnight!" Support provided.   Patient goals remain clear to continue to treat the treatable. He is not interested in artificial feedings/PEG. He understands poor long-term prognosis given co-morbidities and severe malnutrition.   He is remaining hopeful and states hopefully a facility will be found for him.   All questions answered and support provided.   Assessment -NAD, cachectic with muscle wasting, frail -RRR -diminished bilaterally -AAO x3, mood appropriate  Plan -Continue to treat the treatable, encourage increase oral intake. -Patient is clear in expressed wishes, no PEG -PMT will follow peripherally. Goals are clear and set. Please call with urgent needs or to re-engage if goals change.   Time Total: 25 min.   Visit consisted of counseling and education dealing with the complex and emotionally intense issues of symptom management and palliative care in the setting of serious and potentially life-threatening illness.Greater than 50%  of this time was spent counseling and coordinating care related to the above assessment and plan.  Alda Lea, AGPCNP-BC  Palliative Medicine Team [phone]

## 2021-05-12 NOTE — Progress Notes (Signed)
Physical Therapy Treatment Patient Details Name: Ryan [name] MRN: [id number] DOB: 04/14/1961 Today's Date: 05/12/2021    History of Present Illness Patient is a 60 year old male with prostate cancer with widespread bony metastasis, L5 fracture and L3/4 severe stenosis with LE weakness, MRI brain and C/T/L spine shows extensive metastasis in the skull base and whole spine, questionable acute CVA among other things,  Hypocalcemia most likely due to nutritional deficiency and associated hypoalbuminemia, hyoptension. TLSO recommended from nurosurgery and devliered to room.    PT Comments    Pt received supine in bed agreeable to PT services with encouragement. Denying OOB mobility however. Pt educated on importance of OOB mobility for strength with pt verbalizing understanding. Pt performing bouts of AROM LE exercises for strengthening with intermittent need for VC's for improving control of movements to optimize targeted musculature. Pt educated on importance of performing simple LE exercises in bed and seated EOB if pt denying OOB mobility in attempts to maintain current level of function and in attempts to prevent further decline in function. Post LE exercises pt attempts to scoot towards head of bed post session with multimodal cuing and use of UE's on hand rails with minimal movement. Requiring PT minA+1 to scoot. All needs within reach and pt positioned to comfort. Current plan remains appropriate.     Follow Up Recommendations  SNF     Equipment Recommendations  3in1 (PT);Wheelchair cushion (measurements PT);Rolling walker with 5" wheels;Wheelchair (measurements PT);Hospital bed    Recommendations for Other Services       Precautions / Restrictions Precautions Precautions: Fall Required Braces or Orthoses: Spinal Brace;Other Brace Spinal Brace: Thoracolumbosacral orthotic Spinal Brace Comments: for comfort Other Brace: LLE post op shoe Restrictions Weight Bearing Restrictions:  No Other Position/Activity Restrictions: LLE post op shoe    Mobility  Bed Mobility Overal bed mobility: Needs Assistance             General bed mobility comments: MinA+1 to scoot up toward head of bed. Utilized Bridge trechnique and UE's on railing to assist. Minimal movement without PT assist.    Transfers                 General transfer comment: MinA+1 to scoot up toward head of bed. Utilized Bridge trechnique and UE's on railing to assist. Minimal movement without PT assist.  Ambulation/Gait             General Gait Details: Denies OOB mobility at this time.   Stairs             Wheelchair Mobility    Modified Rankin (Stroke Patients Only)       Balance                                            Cognition Arousal/Alertness: Awake/alert Behavior During Therapy: Flat affect Overall Cognitive Status: Within Functional Limits for tasks assessed                                 General Comments: Agreeable to bed level therex, denying further OOB mobility.      Exercises General Exercises - Lower Extremity Ankle Circles/Pumps: AROM;Both;10 reps;Supine Short Arc Quad: AROM;Strengthening;Both;10 reps;Supine Heel Slides: AROM;Strengthening;Both;10 reps;Supine Hip ABduction/ADduction: Supine Straight Leg Raises: AROM;Both;10 reps;Supine Hip Flexion/Marching: AROM;Strengthening;Both;10 reps Other Exercises Other Exercises:  Educated on benefits of OOB mobility. Bridges to tolerance with emphasis on neutral spine alignment. x5 reps due to reports of discomfort.    General Comments General comments (skin integrity, edema, etc.): Remained supine in bed throughout session.      Pertinent Vitals/Pain Pain Assessment: Faces Faces Pain Scale: Hurts a little bit Pain Location: generalized to low back Pain Descriptors / Indicators: Discomfort Pain Intervention(s): Limited activity within patient's tolerance;Monitored  during session;Repositioned    Home Living                      Prior Function            PT Goals (current goals can now be found in the care plan section) Acute Rehab PT Goals Patient Stated Goal: to get stronger PT Goal Formulation: With patient Time For Goal Achievement: 05/24/21 Potential to Achieve Goals: Fair Progress towards PT goals: Progressing toward goals    Frequency    Min 2X/week      PT Plan Current plan remains appropriate    Co-evaluation              AM-PAC PT "6 Clicks" Mobility   Outcome Measure  Help needed turning from your back to your side while in a flat bed without using bedrails?: A Little Help needed moving from lying on your back to sitting on the side of a flat bed without using bedrails?: A Little Help needed moving to and from a bed to a chair (including a wheelchair)?: A Little Help needed standing up from a chair using your arms (e.g., wheelchair or bedside chair)?: A Little Help needed to walk in hospital room?: A Little Help needed climbing 3-5 steps with a railing? : A Lot 6 Click Score: 17    End of Session Equipment Utilized During Treatment: Gait belt Activity Tolerance: Patient tolerated treatment well Patient left: in bed;with bed alarm set Nurse Communication: Mobility status PT Visit Diagnosis: Unsteadiness on feet (R26.81);Muscle weakness (generalized) (M62.81);Difficulty in walking, not elsewhere classified (R26.2)     Time: [id number] PT Time Calculation (min) (ACUTE ONLY): 12 min  Charges:  $Therapeutic Exercise: 8-22 mins                     [name]. Fairly IV, PT, DPT Physical Therapist- [facility]  05/12/2021, 10:13 AM

## 2021-05-12 NOTE — TOC Progression Note (Signed)
Transition of Care [facility]) - Progression Note    Patient Details  Name: Ryan [name] MRN: [id number] Date of Birth: 09-Jul-1961  Transition of Care [facility]) CM/SW [place], RN Phone Number: 05/12/2021, 4:23 PM  Clinical Narrative:   Medicaid still pending, patient and aunt continue to want cancer treatment, as patient states "I will get better."  Awaiting medicaid and disability approval, will resend bed request when appropriate.         Expected Discharge Plan and Services                                                 Social Determinants of Health (SDOH) Interventions    Readmission Risk Interventions Readmission Risk Prevention Plan 04/23/2021  Transportation Screening Complete  PCP or Specialist Appt within 3-5 Days Complete  HRI or [place] Complete  Social Work Consult for [place] Planning/Counseling Not Complete  SW consult not completed comments RNCM assigned to patient  Palliative Care Screening Complete  Medication Review [employer]) Complete  Some recent data might be hidden

## 2021-05-12 NOTE — Progress Notes (Signed)
OT Cancellation Note  Patient Details Name: Ryan [name] MRN: [id number] DOB: 07-Mar-1961   Cancelled Treatment:    Reason Eval/Treat Not Completed: Patient declined, no reason specified Pt declines to engage in OT treatment session at this time despite several attempts to encourage various aspects of participation. Will f/u at later date/time as able/pt agreeable. Thank you.  Gerrianne Scale, [place], OTR/L ascom [phone] 05/12/21, 5:04 PM

## 2021-05-12 NOTE — Progress Notes (Signed)
PROGRESS NOTE    Ryan [name]  [id number] DOB: [birth year] DOA: 03/30/2021 PCP: Merryl Hacker, No    Brief Narrative:  Ryan [name] is a 60 y.o. male with medical history significant for history of polysubstance abuse including THC, cocaine, alcohol, remote history of tobacco use, history of right proximal femur inter trochanteric fracture status post right IM nail intertrochanteric fixation in 04/02/2021, does not follow-up with a PCP, presents to the emergency department for chief concerns of right hip pain.  Pain is chronic for several months, denies any trauma, difficulty ambulation due to pain. He also endorses an unintentional 35 pound weight loss in the last year, denies bright red blood per rectum and melena. patient was found to have L5 compression fracture, L3/4 severe stenosis.  She was also found to have widespread bony metastasis  from prostate cancer. Patient is a followed by oncology.  He needed SNF placement, not able to find accepting facility. He is intermittently refusing some meds.  He Was febrile over the weekend and his blood cultures show 1/4 bottles GPC, possibly contaminant. He has frequent urination and occasional dry cough. CXR unremarkable abut UA is positive for nitrites, rare bacteria. Urine cultures and repeat Blood cultures ordered, if they are negative will discontinue antibiotics in the next 24 to 48 hours.   Pt seen and examined at bedside, no new complaints.  Oral intake is remained poor.  Case discussed with registered dietitian.  Patient not meeting his nutritional goals and has been intermittently refusing medications and multivitamins.  Also intermittently refusing supplemental shakes.  In order to meet nutritional goals patient will likely need a PEG tube.  We will continue to encourage patient to eat and discuss possibility of enteral feeding.  7/27: Palliative care reconsulted to discuss goals of care.  After lengthy discussion with the patient and patient agreed  that he was interested in continuing aggressive treatment and at this point not entertaining de-escalation of care or comfort measures.  Stressed the importance of medication adherence and oral intake.  8/1: Patient's intake is remained poor.  His energy level is also been decreased over the past couple days.  Discussed with oncology, notified on patient's status.  Initiated Decadron 4 mg p.o. daily in an effort to improve this patient's appetite.   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 Neurosurgery consulted recommended not a surgical candidate Plan: Oncology following for inpatient cancer related needs. Patient has been intermittently refusing cancer related medications after repeated discussions he agrees to take the prescribed medications He appears fatigued Plan: Oncology following  Severe symptomatic anemia with folic acid deficiency [id number] Hb 7.0, transfused 1 unit of PRBC 7/24 Hb 8.1 stable  Monitor H&H Transfuse to keep hemoglobin greater than 7. --cont folic acid supplement    Hypomagnesemia --Monitor and replete as needed.    Hyponatremia Likely multifactorial secondary to possible SIADH versus hypovolemia Appears chronic and asymptomatic Fluid restriction and sodium tabs have been discontinued Plan:  Encourage PO intake     Hypokalemia, resolved  non anion gap metabolic acidosis [id number] started  po bicarb 650 TID x 4 days   Cancer cachexia with severe protein calorie malnutrition Dietary on board recommend encouraging oral intake. Patient has been intermittently refusing multivitamins and protein supplements.  Not meeting his nutritional goals.  Has adamantly refused PEG tube placement.  Has agreed to attempt to eat more.  Remove all dietary restrictions.   Continue Decadron and Remeron  Left heel pain X rays of the foot ordered to evaluate for pathological fractures. Negative. Increased oxycodone 1 to 2 pill every 6 hours prn.   Hypotension: Asymptomatic, keep MAP greater than 65, on Midodrine 10 mg po TID Has been taking midodrine intermittently.  When he takes his blood pressure is stable Ensure medication adherence     Fever: No evidence of infection.  No indication for antibiotics   DVT prophylaxis: SQ Lovenox Code Status: Full Family Communication:  disposition Plan: Status is: Inpatient   Dispo: The patient is from: Home              Anticipated d/c is to: SNF              Patient currently is medically stable to d/c.   Difficult to place patient Yes  Unclear disposition plan at this time.  Extended hospitalization.  Severely malnourished.  Not meeting nutritional goals.  Attempting to eat more.  Difficult placement.  Has refused enteral feeding in the past.  Poor prognosis    Level of care: Med-Surg  Consultants:  None  Procedures: None  Antimicrobials:  None   Subjective: Pt asked for pepsi, and has been noted to eat snacks.  Pt also requested to have his friend take him out to get fresh air today.   Objective: Vitals:   05/12/21 0458 05/12/21 0726 05/12/21 1119 05/12/21 1611  BP: (!) 141/73 122/71 114/68 110/88  Pulse: 83 85 86 98  Resp: '16 16 16 20  '$ Temp: 97.9 F (36.6 C) 97.9 F (36.6 C) 98.4 F (36.9 C) 97.7 F (36.5 C)  TempSrc: Oral Oral Oral Oral  SpO2: 100% 100% 100% 100%  Weight:      Height:        Intake/Output Summary  (Last 24 hours) at 05/12/2021 1634 Last data filed at 05/11/2021 2015 Gross per 24 hour  Intake --  Output 125 ml  Net -125 ml     Filed Weights   04/14/21 1246 05/04/21 0846 05/11/21 1515  Weight: 51.5 kg 46.8 kg 43.3 kg    Examination:  Constitutional: NAD, AAOx3, cachetic  HEENT: conjunctivae and lids normal, EOMI CV: No cyanosis.   RESP: normal respiratory effort, on RA Extremities: No effusions, edema in BLE SKIN: warm, dry Neuro: II - XII grossly intact.   Psych: Normal mood and affect.  Appropriate judgement and reason   Data Reviewed: I have personally reviewed following labs and imaging studies  CBC: Recent Labs  Lab 05/07/21 0523 05/10/21 0507  WBC 8.0 7.9  NEUTROABS 6.0 5.2  HGB 7.5* 7.3*  HCT 22.7* 22.0*  MCV 81.4 80.9  PLT 255 [id number]   Basic Metabolic Panel: Recent Labs  Lab 05/07/21 0523 05/10/21 0507  NA 129* 131*  K 4.6 4.2  CL 103 104  CO2 20* 19*  GLUCOSE 136* 108*  BUN 9 12  CREATININE 0.48* 0.48*  CALCIUM 6.9* 8.0*   GFR: Estimated Creatinine Clearance: 60.9 mL/min (A) (by C-G formula based on SCr  of 0.48 mg/dL (L)). Liver Function Tests: No results for input(s): AST, ALT, ALKPHOS, BILITOT, PROT, ALBUMIN in the last 168 hours. No results for input(s): LIPASE, AMYLASE in the last 168 hours. No results for input(s): AMMONIA in the last 168 hours. Coagulation Profile: No results for input(s): INR, PROTIME in the last 168 hours. Cardiac Enzymes: No results for input(s): CKTOTAL, CKMB, CKMBINDEX, TROPONINI in the last 168 hours. BNP (last 3 results) No results for input(s): PROBNP in the last 8760 hours. HbA1C: No results for input(s): HGBA1C in the last 72 hours. CBG: No results for input(s): GLUCAP in the last 168 hours. Lipid Profile: No results for input(s): CHOL, HDL, LDLCALC, TRIG, CHOLHDL, LDLDIRECT in the last 72 hours. Thyroid Function Tests: No results for input(s): TSH, T4TOTAL, FREET4, T3FREE, THYROIDAB in the last 72  hours. Anemia Panel: No results for input(s): VITAMINB12, FOLATE, FERRITIN, TIBC, IRON, RETICCTPCT in the last 72 hours. Sepsis Labs: No results for input(s): PROCALCITON, LATICACIDVEN in the last 168 hours.  No results found for this or any previous visit (from the past 240 hour(s)).        Radiology Studies: No results found.      Scheduled Meds:  dexamethasone  4 mg Oral Daily   enoxaparin (LOVENOX) injection  40 mg Subcutaneous Q24H   feeding supplement  237 mL Oral TID PC & HS   ferrous sulfate  325 mg Oral Q breakfast   folic acid  1 mg Oral Daily   melatonin  5 mg Oral QHS   midodrine  10 mg Oral TID WC   mirtazapine  15 mg Oral QHS   polyethylene glycol  17 g Oral Daily   Vitamin D (Ergocalciferol)  50,000 Units Oral Q7 days   Continuous Infusions:     LOS: 43 days    Enzo Bi, MD [facility] Pager [phone]  If 7PM-7AM, please contact night-coverage 05/12/2021, 4:34 PM

## 2021-05-13 LAB — URINE DRUG SCREEN, QUALITATIVE (ARMC ONLY)
Amphetamines, Ur Screen: NOT DETECTED
Barbiturates, Ur Screen: NOT DETECTED
Benzodiazepine, Ur Scrn: NOT DETECTED
Cannabinoid 50 Ng, Ur ~~LOC~~: NOT DETECTED
Cocaine Metabolite,Ur ~~LOC~~: NOT DETECTED
MDMA (Ecstasy)Ur Screen: NOT DETECTED
Methadone Scn, Ur: NOT DETECTED
Opiate, Ur Screen: NOT DETECTED
Phencyclidine (PCP) Ur S: NOT DETECTED
Tricyclic, Ur Screen: NOT DETECTED

## 2021-05-13 NOTE — Progress Notes (Signed)
PT Cancellation Note  Patient Details Name: Ryan [name] MRN: [id number] DOB: 02/12/1961   Cancelled Treatment:    Reason Eval/Treat Not Completed: Patient declined, no reason specified. Pt reports being tired and not wanting to participate at this time. Requesting PT to re-attempt in the morning. PT will re-attempt as available.   [name]. Fairly IV, PT, DPT Physical Therapist- [facility]  05/13/2021, 3:46 PM

## 2021-05-13 NOTE — Progress Notes (Signed)
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   OT comments  Ryan [name] did well today -- engaged and talkative throughout session, eager to participate, able to maintain good seated balance on EOB for 15+ minutes. A&O x 4, denies pain, SUPV for most bed mobility and transfers, Min A for scooting towards HOB from supine position. Good progress today; will continue to follow POC.   Follow Up Recommendations  SNF    Equipment Recommendations  None recommended by OT    Recommendations for Other Services      Precautions / Restrictions Precautions Precautions: Fall Spinal Brace: Thoracolumbosacral orthotic Spinal Brace Comments: PRN for comfort Other Brace: LLE post op shoe Restrictions Weight Bearing Restrictions: No Other Position/Activity Restrictions: LLE post op shoe, PRN pain       Mobility Bed Mobility Overal bed mobility: Needs Assistance Bed Mobility: Supine to Sit;Sit to Supine;Rolling Rolling: Modified independent (Device/Increase time)   Supine to sit: Supervision Sit to supine: Supervision   General bed mobility comments: MinA to scoot up toward head of bed from supine position. Following VCs, utilized bridge technique and UEs on railing to assist.    Transfers Overall transfer level: Needs assistance Equipment used: 1 person hand held assist Transfers: Sit to/from Stand   Stand pivot transfers: Supervision            Balance Overall balance assessment: Needs assistance Sitting-balance  support: Feet supported;No upper extremity supported Sitting balance-Leahy Scale: Good Sitting balance - Comments: pt able to maintain good balance for 15+ minutes while engaging in seated therapeautic activities   Standing balance support: Single extremity supported Standing balance-Leahy Scale: Fair                             ADL either performed or assessed with clinical judgement   ADL Overall ADL's : Needs assistance/impaired Eating/Feeding: Modified independent                                     General ADL Comments: Pt eager to engage in therapy this day.     Vision Patient Visual Report: No change from baseline     Perception     Praxis      Cognition Arousal/Alertness: Awake/alert Behavior During Therapy: WFL for tasks assessed/performed Overall Cognitive Status: Within Functional Limits for tasks assessed                                          Exercises Other Exercises Other Exercises: Bed mobility, transfers, lateral scoot, sitting balance tolerance, discussion re: goals of care, therapeutic listening   Shoulder Instructions       General Comments      Pertinent Vitals/ Pain       Pain Assessment: No/denies pain  Home Living  Prior Functioning/Environment              Frequency  Min 1X/week        Progress Toward Goals  OT Goals(current goals can now be found in the care plan section)  Progress towards OT goals: Progressing toward goals  Acute Rehab OT Goals Patient Stated Goal: to get stronger OT Goal Formulation: With patient Time For Goal Achievement: 05/25/21  Plan Discharge plan remains appropriate;Frequency remains appropriate    Co-evaluation                 AM-PAC OT "6 Clicks" Daily Activity     Outcome Measure   Help from another person eating meals?: None Help from another person taking care of personal  grooming?: A Little Help from another person toileting, which includes using toliet, bedpan, or urinal?: A Lot Help from another person bathing (including washing, rinsing, drying)?: A Lot Help from another person to put on and taking off regular upper body clothing?: A Little Help from another person to put on and taking off regular lower body clothing?: A Little 6 Click Score: 17    End of Session    OT Visit Diagnosis: Unsteadiness on feet (R26.81);Muscle weakness (generalized) (M62.81)   Activity Tolerance Patient tolerated treatment well   Patient Left in bed;with call bell/phone within reach;with bed alarm set   Nurse Communication          Time: [id number] OT Time Calculation (min): 34 min  Charges: OT General Charges $OT Visit: 1 Visit OT Treatments $Self Care/Home Management : 8-22 mins $Therapeutic Activity: 8-22 mins  Ryan Lobo, PhD, MS, OTR/L 05/13/21, 4:33 PM

## 2021-05-13 NOTE — Progress Notes (Signed)
PROGRESS NOTE    Ryan [name]  [id number] DOB: [birth year] DOA: 03/30/2021 PCP: Merryl Hacker, No    Brief Narrative:  Ryan [name] is a 60 y.o. male with medical history significant for history of polysubstance abuse including THC, cocaine, alcohol, remote history of tobacco use, history of right proximal femur inter trochanteric fracture status post right IM nail intertrochanteric fixation in 04/02/2021, does not follow-up with a PCP, presents to the emergency department for chief concerns of right hip pain.  Pain is chronic for several months, denies any trauma, difficulty ambulation due to pain. He also endorses an unintentional 35 pound weight loss in the last year, denies bright red blood per rectum and melena. patient was found to have L5 compression fracture, L3/4 severe stenosis.  She was also found to have widespread bony metastasis  from prostate cancer. Patient is a followed by oncology.  He needed SNF placement, not able to find accepting facility. He is intermittently refusing some meds.  He Was febrile over the weekend and his blood cultures show 1/4 bottles GPC, possibly contaminant. He has frequent urination and occasional dry cough. CXR unremarkable abut UA is positive for nitrites, rare bacteria. Urine cultures and repeat Blood cultures ordered, if they are negative will discontinue antibiotics in the next 24 to 48 hours.   Pt seen and examined at bedside, no new complaints.  Oral intake is remained poor.  Case discussed with registered dietitian.  Patient not meeting his nutritional goals and has been intermittently refusing medications and multivitamins.  Also intermittently refusing supplemental shakes.  In order to meet nutritional goals patient will likely need a PEG tube.  We will continue to encourage patient to eat and discuss possibility of enteral feeding.  7/27: Palliative care reconsulted to discuss goals of care.  After lengthy discussion with the patient and patient agreed  that he was interested in continuing aggressive treatment and at this point not entertaining de-escalation of care or comfort measures.  Stressed the importance of medication adherence and oral intake.  8/1: Patient's intake is remained poor.  His energy level is also been decreased over the past couple days.  Discussed with oncology, notified on patient's status.  Initiated Decadron 4 mg p.o. daily in an effort to improve this patient's appetite.   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 Neurosurgery consulted recommended not a surgical candidate Plan: Oncology following for inpatient cancer related needs. Patient has been intermittently refusing cancer related medications after repeated discussions he agrees to take the prescribed medications Plan: Oncology following  Severe symptomatic anemia with folic acid deficiency 123456 Hb 7.0, transfused 1 unit of PRBC 7/24 Hb 8.1 stable  Monitor H&H Transfuse to keep hemoglobin greater than 7. --cont folic acid supplement   Hypomagnesemia --Monitor and replete as needed.    Hyponatremia Likely multifactorial secondary to possible SIADH versus hypovolemia Appears chronic and asymptomatic Fluid restriction and sodium tabs have been discontinued Plan: Encourage PO intake  Hypokalemia, resolved  non anion gap metabolic acidosis [id number] started po bicarb 650 TID x  4 days   Cancer cachexia with severe protein calorie malnutrition Dietary on board recommend encouraging oral intake. Patient has been intermittently refusing multivitamins and protein supplements.  Not meeting his nutritional goals.  Has adamantly refused PEG tube placement.  Has agreed to attempt to eat more.  Remove all dietary restrictions.   Continue Decadron and Remeron  Left heel pain, resolved X rays of the foot ordered to evaluate for pathological fractures. Negative. D/c oxycodone   Hypotension: Asymptomatic, keep MAP greater than 65, on Midodrine 10 mg po TID Has been taking midodrine intermittently.  When he takes his blood pressure is stable Ensure medication adherence     Fever: No evidence of infection.  No indication for antibiotics   DVT prophylaxis: SQ Lovenox Code Status: Full Family Communication:  disposition Plan: Status is: Inpatient   Dispo: The patient is from: Home              Anticipated d/c is to: SNF              Patient currently is medically stable to d/c.   Difficult to place patient Yes  Unclear disposition plan at this time.  Extended hospitalization.  Severely malnourished.  Not meeting nutritional goals.  Attempting to eat more.  Difficult placement.  Has refused enteral feeding in the past.  Poor prognosis    Level of care: Med-Surg  Consultants:  None  Procedures: None  Antimicrobials:  None   Subjective: Had a visit from a friend yesterday who took pt out to get some fresh air.  Pt reported feeling tired today.    Obtained UDS today, neg.   Objective: Vitals:   05/13/21 0514 05/13/21 0757 05/13/21 1113 05/13/21 1553  BP: 117/67 98/62 104/63 105/64  Pulse: 87 81 86 88  Resp: '17 16 16 18  '$ Temp: 97.9 F (36.6 C) 98.2 F (36.8 C) 98 F (36.7 C) 99 F (37.2 C)  TempSrc:      SpO2: 100% 100% 100% 100%  Weight:      Height:        Intake/Output Summary (Last 24 hours) at 05/13/2021 1739 Last data filed at  05/13/2021 1102 Gross per 24 hour  Intake --  Output 250 ml  Net -250 ml     Filed Weights   04/14/21 1246 05/04/21 0846 05/11/21 1515  Weight: 51.5 kg 46.8 kg 43.3 kg    Examination:  Constitutional: NAD, AAOx3, cachetic  HEENT: conjunctivae and lids normal, EOMI CV: No cyanosis.   RESP: normal respiratory effort, on RA Extremities: No effusions, edema in BLE SKIN: warm, dry Neuro: II - XII grossly intact.   Psych: Normal mood and affect.  Appropriate judgement and reason   Data Reviewed: I have personally reviewed following labs and imaging studies  CBC: Recent Labs  Lab 05/07/21 0523 05/10/21 0507  WBC 8.0 7.9  NEUTROABS 6.0 5.2  HGB 7.5* 7.3*  HCT 22.7* 22.0*  MCV 81.4 80.9  PLT 255 [id number]   Basic Metabolic Panel: Recent Labs  Lab 05/07/21 0523 05/10/21 0507  NA 129* 131*  K 4.6 4.2  CL 103 104  CO2 20* 19*  GLUCOSE 136* 108*  BUN 9 12  CREATININE 0.48* 0.48*  CALCIUM 6.9* 8.0*   GFR: Estimated Creatinine Clearance: 60.9 mL/min (A) (by C-G formula based on SCr of 0.48 mg/dL (L)). Liver Function Tests: No results for input(s): AST, ALT,  ALKPHOS, BILITOT, PROT, ALBUMIN in the last 168 hours. No results for input(s): LIPASE, AMYLASE in the last 168 hours. No results for input(s): AMMONIA in the last 168 hours. Coagulation Profile: No results for input(s): INR, PROTIME in the last 168 hours. Cardiac Enzymes: No results for input(s): CKTOTAL, CKMB, CKMBINDEX, TROPONINI in the last 168 hours. BNP (last 3 results) No results for input(s): PROBNP in the last 8760 hours. HbA1C: No results for input(s): HGBA1C in the last 72 hours. CBG: No results for input(s): GLUCAP in the last 168 hours. Lipid Profile: No results for input(s): CHOL, HDL, LDLCALC, TRIG, CHOLHDL, LDLDIRECT in the last 72 hours. Thyroid Function Tests: No results for input(s): TSH, T4TOTAL, FREET4, T3FREE, THYROIDAB in the last 72 hours. Anemia Panel: No results for input(s): VITAMINB12,  FOLATE, FERRITIN, TIBC, IRON, RETICCTPCT in the last 72 hours. Sepsis Labs: No results for input(s): PROCALCITON, LATICACIDVEN in the last 168 hours.  No results found for this or any previous visit (from the past 240 hour(s)).        Radiology Studies: No results found.      Scheduled Meds:  dexamethasone  4 mg Oral Daily   enoxaparin (LOVENOX) injection  40 mg Subcutaneous Q24H   feeding supplement  237 mL Oral TID PC & HS   ferrous sulfate  325 mg Oral Q breakfast   folic acid  1 mg Oral Daily   melatonin  5 mg Oral QHS   midodrine  10 mg Oral TID WC   mirtazapine  15 mg Oral QHS   polyethylene glycol  17 g Oral Daily   Vitamin D (Ergocalciferol)  50,000 Units Oral Q7 days   Continuous Infusions:     LOS: 44 days    Enzo Bi, MD [facility] Pager [phone]  If 7PM-7AM, please contact night-coverage 05/13/2021, 5:39 PM

## 2021-05-14 LAB — CBC
HCT: 24 % — ABNORMAL LOW (ref 39.0–52.0)
Hemoglobin: 7.7 g/dL — ABNORMAL LOW (ref 13.0–17.0)
MCH: 26.8 pg (ref 26.0–34.0)
MCHC: 32.1 g/dL (ref 30.0–36.0)
MCV: 83.6 fL (ref 80.0–100.0)
Platelets: 395 10*3/uL (ref 150–400)
RBC: 2.87 MIL/uL — ABNORMAL LOW (ref 4.22–5.81)
RDW: 16.8 % — ABNORMAL HIGH (ref 11.5–15.5)
WBC: 9.9 10*3/uL (ref 4.0–10.5)
nRBC: 0.3 % — ABNORMAL HIGH (ref 0.0–0.2)

## 2021-05-14 MED ORDER — MIDODRINE HCL 5 MG PO TABS
7.5000 mg | ORAL_TABLET | Freq: Three times a day (TID) | ORAL | Status: DC
  Administered 2021-05-15 – 2021-05-18 (×8): 7.5 mg via ORAL
  Filled 2021-05-14 (×7): qty 2

## 2021-05-14 NOTE — Progress Notes (Signed)
PROGRESS NOTE    Ryan [name]  [id number] DOB: [birth year] DOA: 03/30/2021 PCP: Merryl Hacker, No    Brief Narrative:  Ryan [name] is a 61 y.o. male with medical history significant for history of polysubstance abuse including THC, cocaine, alcohol, remote history of tobacco use, history of right proximal femur inter trochanteric fracture status post right IM nail intertrochanteric fixation in 04/02/2021, does not follow-up with a PCP, presents to the emergency department for chief concerns of right hip pain.  Pain is chronic for several months, denies any trauma, difficulty ambulation due to pain. He also endorses an unintentional 35 pound weight loss in the last year, denies bright red blood per rectum and melena. patient was found to have L5 compression fracture, L3/4 severe stenosis.  She was also found to have widespread bony metastasis  from prostate cancer. Patient is a followed by oncology.  He needed SNF placement, not able to find accepting facility. He is intermittently refusing some meds.  He Was febrile over the weekend and his blood cultures show 1/4 bottles GPC, possibly contaminant. He has frequent urination and occasional dry cough. CXR unremarkable abut UA is positive for nitrites, rare bacteria. Urine cultures and repeat Blood cultures ordered, if they are negative will discontinue antibiotics in the next 24 to 48 hours.   Pt seen and examined at bedside, no new complaints.  Oral intake is remained poor.  Case discussed with registered dietitian.  Patient not meeting his nutritional goals and has been intermittently refusing medications and multivitamins.  Also intermittently refusing supplemental shakes.  In order to meet nutritional goals patient will likely need a PEG tube.  We will continue to encourage patient to eat and discuss possibility of enteral feeding.  7/27: Palliative care reconsulted to discuss goals of care.  After lengthy discussion with the patient and patient agreed  that he was interested in continuing aggressive treatment and at this point not entertaining de-escalation of care or comfort measures.  Stressed the importance of medication adherence and oral intake.  8/1: Patient's intake is remained poor.  His energy level is also been decreased over the past couple days.  Discussed with oncology, notified on patient's status.  Initiated Decadron 4 mg p.o. daily in an effort to improve this patient's appetite.   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 Neurosurgery consulted recommended not a surgical candidate Plan: Oncology following for inpatient cancer related needs. Patient has been intermittently refusing cancer related medications after repeated discussions he agrees to take the prescribed medications Plan: Oncology following  Severe symptomatic anemia with folic acid deficiency 123456 Hb 7.0, transfused 1 unit of PRBC 7/24 Hb 8.1 stable  Monitor H&H Transfuse to keep hemoglobin greater than 7. --cont folic acid supplement   Hypomagnesemia --Monitor and replete as needed.    Hyponatremia Likely multifactorial secondary to possible SIADH versus hypovolemia Appears chronic and asymptomatic Fluid restriction and sodium tabs have been discontinued Plan: Encourage PO intake  Hypokalemia, resolved  non anion gap metabolic acidosis [id number] started po bicarb 650 TID x  4 days   Cancer cachexia with severe protein calorie malnutrition Dietary on board recommend encouraging oral intake. Patient has been intermittently refusing multivitamins and protein supplements.  Not meeting his nutritional goals.  Has adamantly refused PEG tube placement.  Has agreed to attempt to eat more.  Remove all dietary restrictions.   Continue Decadron and Remeron  Left heel pain, resolved X rays of the foot ordered to evaluate for pathological fractures. Negative. D/c oxycodone   Hypotension: Asymptomatic, keep MAP greater than 65 --decrease midodrine to 7.5 mg TID   DVT prophylaxis: SQ Lovenox Code Status: Full Family Communication:  disposition Plan: Status is: Inpatient   Dispo: The patient is from: Home              Anticipated d/c is to: SNF              Patient currently is medically stable to d/c.   Difficult to place patient Yes  Unclear disposition plan at this time.  Extended hospitalization.  Severely malnourished.  Not meeting nutritional goals.  Attempting to eat more.  Difficult placement.  Has refused enteral feeding in the past.  Poor prognosis  Level of care: Med-Surg  Consultants:  None  Procedures: None  Antimicrobials:  None   Subjective: Pt was taken outside by visitor for fresh air again, with nursing's approval.  He also ate fried chicken, and said he had a good day.   Objective: Vitals:   05/14/21 0846 05/14/21 0848 05/14/21 1156 05/14/21 1157  BP: 94/64 105/69 104/66 (!) 94/49  Pulse: 79 80 83 82  Resp: 15  16   Temp: 98.6 F (37 C)  98.1 F (36.7 C)   TempSrc:      SpO2: 100%  99%   Weight:      Height:        Intake/Output Summary (Last 24 hours) at 05/14/2021 1643 Last data filed at 05/14/2021 0000 Gross per 24 hour  Intake --  Output 550 ml  Net -550 ml     Filed Weights   04/14/21 1246 05/04/21 0846 05/11/21 1515  Weight: 51.5 kg 46.8 kg 43.3 kg    Examination:  Constitutional: NAD, AAOx3,  cachectic HEENT: conjunctivae and lids normal, EOMI CV: No cyanosis.   RESP: normal respiratory effort, on RA Extremities: No effusions, edema in BLE SKIN: warm, dry Neuro: II - XII grossly intact.   Psych: Normal mood and affect.     Data Reviewed: I have personally reviewed following labs and imaging studies  CBC: Recent Labs  Lab 05/10/21 0507 05/14/21 0424  WBC 7.9 9.9  NEUTROABS 5.2  --   HGB 7.3* 7.7*  HCT 22.0* 24.0*  MCV 80.9 83.6  PLT 316 [id number]   Basic Metabolic Panel: Recent Labs  Lab 05/10/21 0507  NA 131*  K 4.2  CL 104  CO2 19*  GLUCOSE 108*  BUN 12  CREATININE 0.48*  CALCIUM 8.0*   GFR: Estimated Creatinine Clearance: 60.9 mL/min (A) (by C-G formula based on SCr of 0.48 mg/dL (L)). Liver Function Tests: No results for input(s): AST, ALT, ALKPHOS, BILITOT, PROT, ALBUMIN in the last 168 hours. No results for input(s): LIPASE, AMYLASE in the last 168 hours. No results for input(s): AMMONIA in the last 168 hours. Coagulation Profile: No results for input(s): INR, PROTIME in the last 168 hours. Cardiac Enzymes: No results for input(s): CKTOTAL, CKMB, CKMBINDEX, TROPONINI  in the last 168 hours. BNP (last 3 results) No results for input(s): PROBNP in the last 8760 hours. HbA1C: No results for input(s): HGBA1C in the last 72 hours. CBG: No results for input(s): GLUCAP in the last 168 hours. Lipid Profile: No results for input(s): CHOL, HDL, LDLCALC, TRIG, CHOLHDL, LDLDIRECT in the last 72 hours. Thyroid Function Tests: No results for input(s): TSH, T4TOTAL, FREET4, T3FREE, THYROIDAB in the last 72 hours. Anemia Panel: No results for input(s): VITAMINB12, FOLATE, FERRITIN, TIBC, IRON, RETICCTPCT in the last 72 hours. Sepsis Labs: No results for input(s): PROCALCITON, LATICACIDVEN in the last 168 hours.  No results found for this or any previous visit (from the past 240 hour(s)).        Radiology Studies: No results found.      Scheduled  Meds:  dexamethasone  4 mg Oral Daily   enoxaparin (LOVENOX) injection  40 mg Subcutaneous Q24H   feeding supplement  237 mL Oral TID PC & HS   ferrous sulfate  325 mg Oral Q breakfast   folic acid  1 mg Oral Daily   melatonin  5 mg Oral QHS   midodrine  10 mg Oral TID WC   mirtazapine  15 mg Oral QHS   polyethylene glycol  17 g Oral Daily   Vitamin D (Ergocalciferol)  50,000 Units Oral Q7 days   Continuous Infusions:     LOS: 45 days    Enzo Bi, MD [facility] Pager [phone]  If 7PM-7AM, please contact night-coverage 05/14/2021, 4:43 PM

## 2021-05-14 NOTE — Progress Notes (Signed)
Patient off floor with visitor via wheelchair. Asked to be taken outside for fresh air. Provider aware.

## 2021-05-15 NOTE — Progress Notes (Signed)
PROGRESS NOTE    Ryan [name]  [id number] DOB: 1961-09-14 DOA: 03/30/2021 PCP: Merryl Hacker, No    Brief Narrative:  Ryan [name] is a 60 y.o. male with medical history significant for history of polysubstance abuse including THC, cocaine, alcohol, remote history of tobacco use, history of right proximal femur inter trochanteric fracture status post right IM nail intertrochanteric fixation in 04/02/2021, does not follow-up with a PCP, presents to the emergency department for chief concerns of right hip pain.  Pain is chronic for several months, denies any trauma, difficulty ambulation due to pain. He also endorses an unintentional 35 pound weight loss in the last year, denies bright red blood per rectum and melena. patient was found to have L5 compression fracture, L3/4 severe stenosis.  She was also found to have widespread bony metastasis  from prostate cancer. Patient is a followed by oncology.  He needed SNF placement, not able to find accepting facility. He is intermittently refusing some meds.  He Was febrile over the weekend and his blood cultures show 1/4 bottles GPC, possibly contaminant. He has frequent urination and occasional dry cough. CXR unremarkable abut UA is positive for nitrites, rare bacteria. Urine cultures and repeat Blood cultures ordered, if they are negative will discontinue antibiotics in the next 24 to 48 hours.   Pt seen and examined at bedside, no new complaints.  Oral intake is remained poor.  Case discussed with registered dietitian.  Patient not meeting his nutritional goals and has been intermittently refusing medications and multivitamins.  Also intermittently refusing supplemental shakes.  In order to meet nutritional goals patient will likely need a PEG tube.  We will continue to encourage patient to eat and discuss possibility of enteral feeding.  7/27: Palliative care reconsulted to discuss goals of care.  After lengthy discussion with the patient and patient agreed  that he was interested in continuing aggressive treatment and at this point not entertaining de-escalation of care or comfort measures.  Stressed the importance of medication adherence and oral intake.  8/1: Patient's intake is remained poor.  His energy level is also been decreased over the past couple days.  Discussed with oncology, notified on patient's status.  Initiated Decadron 4 mg p.o. daily in an effort to improve this patient's appetite.   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)   Infestation by bed bug   Prostate cancer metastatic to bone (HCC)   Hypotension   Metastatic prostate cancer  --with mets to bone with associated L5 compression fracture, L3-L4 severe spinal stenosis with right lower extremity weakness. Neurosurgery consulted recommended not a surgical candidate Plan: Oncology following for inpatient cancer related needs. Patient has been intermittently refusing cancer related medications after repeated discussions he agrees to take the prescribed medications  Severe symptomatic anemia with folic acid deficiency 123456 Hb 7.0, transfused 1 unit of PRBC 7/24 Hb 8.1 stable  Monitor H&H Transfuse to keep hemoglobin greater than 7. --cont folic acid supplement   Hypomagnesemia --Monitor and replete as needed.    Hyponatremia Likely multifactorial secondary to possible SIADH versus hypovolemia Appears chronic and asymptomatic Fluid restriction and sodium tabs have been discontinued Plan: Encourage PO intake  Hypokalemia, resolved  non anion gap metabolic acidosis 99991111 started po bicarb 650 TID x 4 days   Cancer cachexia  with severe protein calorie malnutrition Dietary on board recommend encouraging oral intake. Patient has been intermittently refusing multivitamins and protein supplements.  Not meeting his nutritional goals.  Has adamantly refused PEG tube placement.  Has agreed to attempt to eat more.  Remove all dietary restrictions.   Continue Decadron and Remeron  Left heel pain, resolved X rays of the foot ordered to evaluate for pathological fractures. Negative.   Hypotension: Asymptomatic, keep MAP greater than 65 --cont midodrine 7.5 mg TID   DVT prophylaxis: SQ Lovenox Code Status: Full Family Communication:  disposition Plan: Status is: Inpatient   Dispo: The patient is from: Home              Anticipated d/c is to: SNF              Patient currently is medically stable to d/c.   Difficult to place patient Yes  Unclear disposition plan at this time.  Extended hospitalization.  Severely malnourished.  Not meeting nutritional goals.  Attempting to eat more.  Difficult placement.  Has refused enteral feeding in the past.  Poor prognosis  Level of care: Med-Surg  Consultants:  None  Procedures: None  Antimicrobials:  None   Subjective: Eating a double cheese burger during my visit.  No new complaints.   Objective: Vitals:   05/14/21 2331 05/15/21 0431 05/15/21 0816 05/15/21 1158  BP: 118/68 120/65 101/63 105/63  Pulse: 85 77 82 80  Resp: '16 16 16 16  '$ Temp: 98.4 F (36.9 C) 98.7 F (37.1 C) 98.1 F (36.7 C) 97.8 F (36.6 C)  TempSrc:      SpO2: 100% 100% 100% 100%  Weight:      Height:        Intake/Output Summary (Last 24 hours) at 05/15/2021 1537 Last data filed at 05/15/2021 1450 Gross per 24 hour  Intake --  Output 3365 ml  Net -3365 ml     Filed Weights   04/14/21 1246 05/04/21 0846 05/11/21 1515  Weight: 51.5 kg 46.8 kg 43.3 kg    Examination:  Constitutional: NAD, AAOx3, cachectic  HEENT: conjunctivae and lids normal, EOMI CV: No cyanosis.    RESP: normal respiratory effort, on RA Extremities: No effusions, edema in BLE SKIN: warm, dry Neuro: II - XII grossly intact.   Psych: Normal mood and affect.     Data Reviewed: I have personally reviewed following labs and imaging studies  CBC: Recent Labs  Lab 05/10/21 0507 05/14/21 0424  WBC 7.9 9.9  NEUTROABS 5.2  --   HGB 7.3* 7.7*  HCT 22.0* 24.0*  MCV 80.9 83.6  PLT 316 [id number]   Basic Metabolic Panel: Recent Labs  Lab 05/10/21 0507  NA 131*  K 4.2  CL 104  CO2 19*  GLUCOSE 108*  BUN 12  CREATININE 0.48*  CALCIUM 8.0*   GFR: Estimated Creatinine Clearance: 60.9 mL/min (A) (by C-G formula based on SCr of 0.48 mg/dL (L)). Liver Function Tests: No results for input(s): AST, ALT, ALKPHOS, BILITOT, PROT, ALBUMIN in the last 168 hours. No results for input(s): LIPASE, AMYLASE in the last 168 hours. No results for input(s): AMMONIA in the last 168 hours. Coagulation Profile: No results for input(s): INR, PROTIME in the last 168 hours. Cardiac Enzymes: No results for input(s): CKTOTAL, CKMB, CKMBINDEX, TROPONINI in the last 168 hours. BNP (last 3 results) No results  for input(s): PROBNP in the last 8760 hours. HbA1C: No results for input(s): HGBA1C in the last 72 hours. CBG: No results for input(s): GLUCAP in the last 168 hours. Lipid Profile: No results for input(s): CHOL, HDL, LDLCALC, TRIG, CHOLHDL, LDLDIRECT in the last 72 hours. Thyroid Function Tests: No results for input(s): TSH, T4TOTAL, FREET4, T3FREE, THYROIDAB in the last 72 hours. Anemia Panel: No results for input(s): VITAMINB12, FOLATE, FERRITIN, TIBC, IRON, RETICCTPCT in the last 72 hours. Sepsis Labs: No results for input(s): PROCALCITON, LATICACIDVEN in the last 168 hours.  No results found for this or any previous visit (from the past 240 hour(s)).        Radiology Studies: No results found.      Scheduled Meds:  dexamethasone  4 mg Oral Daily   enoxaparin (LOVENOX) injection   40 mg Subcutaneous Q24H   feeding supplement  237 mL Oral TID PC & HS   ferrous sulfate  325 mg Oral Q breakfast   folic acid  1 mg Oral Daily   melatonin  5 mg Oral QHS   midodrine  7.5 mg Oral TID WC   mirtazapine  15 mg Oral QHS   polyethylene glycol  17 g Oral Daily   Vitamin D (Ergocalciferol)  50,000 Units Oral Q7 days   Continuous Infusions:     LOS: 46 days    Enzo Bi, MD [facility] Pager [phone]  If 7PM-7AM, please contact night-coverage 05/15/2021, 3:37 PM

## 2021-05-16 MED ORDER — ONDANSETRON HCL 4 MG/2ML IJ SOLN
4.0000 mg | Freq: Four times a day (QID) | INTRAMUSCULAR | Status: DC | PRN
  Administered 2021-05-17 – 2021-06-02 (×14): 4 mg via INTRAVENOUS
  Filled 2021-05-16 (×15): qty 2

## 2021-05-16 NOTE — Progress Notes (Signed)
PROGRESS NOTE    Ryan [name]  [id number] DOB: 25-Mar-1961 DOA: 03/30/2021 PCP: Merryl Hacker, No    Brief Narrative:  Ryan [name] is a 60 y.o. male with medical history significant for history of polysubstance abuse including THC, cocaine, alcohol, remote history of tobacco use, history of right proximal femur inter trochanteric fracture status post right IM nail intertrochanteric fixation in 04/02/2021, does not follow-up with a PCP, presents to the emergency department for chief concerns of right hip pain.  Pain is chronic for several months, denies any trauma, difficulty ambulation due to pain. He also endorses an unintentional 35 pound weight loss in the last year, denies bright red blood per rectum and melena. patient was found to have L5 compression fracture, L3/4 severe stenosis.  She was also found to have widespread bony metastasis  from prostate cancer. Patient is a followed by oncology.  He needed SNF placement, not able to find accepting facility. He is intermittently refusing some meds.  He Was febrile over the weekend and his blood cultures show 1/4 bottles GPC, possibly contaminant. He has frequent urination and occasional dry cough. CXR unremarkable abut UA is positive for nitrites, rare bacteria. Urine cultures and repeat Blood cultures ordered, if they are negative will discontinue antibiotics in the next 24 to 48 hours.   Pt seen and examined at bedside, no new complaints.  Oral intake is remained poor.  Case discussed with registered dietitian.  Patient not meeting his nutritional goals and has been intermittently refusing medications and multivitamins.  Also intermittently refusing supplemental shakes.  In order to meet nutritional goals patient will likely need a PEG tube.  We will continue to encourage patient to eat and discuss possibility of enteral feeding.  7/27: Palliative care reconsulted to discuss goals of care.  After lengthy discussion with the patient and patient agreed  that he was interested in continuing aggressive treatment and at this point not entertaining de-escalation of care or comfort measures.  Stressed the importance of medication adherence and oral intake.  8/1: Patient's intake is remained poor.  His energy level is also been decreased over the past couple days.  Discussed with oncology, notified on patient's status.  Initiated Decadron 4 mg p.o. daily in an effort to improve this patient's appetite.   Assessment & Plan:   Principal Problem:   Symptomatic anemia Active Problems:   Hyponatremia   Closed rib fracture   Acute anemia   Thrombocytopenia (HCC)   Thoracic compression fracture, closed, initial encounter 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 Neurosurgery consulted recommended not a surgical candidate Plan: Oncology following for inpatient cancer related needs. Patient has been intermittently refusing cancer related medications after repeated discussions he agrees to take the prescribed medications  Severe symptomatic anemia with folic acid deficiency 123456 Hb 7.0, transfused 1 unit of PRBC 7/24 Hb 8.1 stable  Monitor H&H Transfuse to keep hemoglobin greater than 7. --cont folic acid supplement   Hypomagnesemia --Monitor and replete as needed.    Hyponatremia Likely multifactorial secondary to possible SIADH versus hypovolemia Appears chronic and asymptomatic Fluid restriction and sodium tabs have been discontinued Plan: Encourage PO intake  Hypokalemia, resolved  non anion gap metabolic acidosis [id number] started po bicarb 650 TID x 4 days   Cancer cachexia  with severe protein calorie malnutrition Dietary on board recommend encouraging oral intake. Patient has been intermittently refusing multivitamins and protein supplements.  Not meeting his nutritional goals.  Has adamantly refused PEG tube placement.  Has agreed to attempt to eat more.  Remove all dietary restrictions.   Continue Decadron and Remeron  Left heel pain, resolved X rays of the foot ordered to evaluate for pathological fractures. Negative.   Hypotension: Asymptomatic, keep MAP greater than 65 --cont midodrine 7.5 mg TID   DVT prophylaxis: SQ Lovenox Code Status: Full Family Communication:  disposition Plan: Status is: Inpatient   Dispo: The patient is from: Home              Anticipated d/c is to: SNF              Patient currently is medically stable to d/c.   Difficult to place patient Yes  Unclear disposition plan at this time.  Extended hospitalization.  Severely malnourished.  Not meeting nutritional goals.  Attempting to eat more.  Difficult placement.  Has refused enteral feeding in the past.  Poor prognosis  Level of care: Med-Surg  Consultants:  None  Procedures: None  Antimicrobials:  None   Subjective: Ate his meals.  [place].  No new complaints or needs.   Objective: Vitals:   05/16/21 0328 05/16/21 0729 05/16/21 1116 05/16/21 1523  BP: (!) 142/102 (!) 109/59 94/68 105/66  Pulse: 76 81 76 81  Resp: '15 16 16 16  '$ Temp: (!) 97.3 F (36.3 C) 98.5 F (36.9 C) (!) 97.3 F (36.3 C) 98.2 F (36.8 C)  TempSrc:  Oral Oral Oral  SpO2: 100% 100% 100% 100%  Weight:      Height:        Intake/Output Summary (Last 24 hours) at 05/16/2021 1819 Last data filed at 05/16/2021 1510 Gross per 24 hour  Intake --  Output 1825 ml  Net -1825 ml     Filed Weights   04/14/21 1246 05/04/21 0846 05/11/21 1515  Weight: 51.5 kg 46.8 kg 43.3 kg    Examination:  Constitutional: NAD, AAOx3, cachectic  HEENT: conjunctivae and lids normal, EOMI CV: No  cyanosis.   RESP: normal respiratory effort, on RA Extremities: No effusions, edema in BLE SKIN: warm, dry Neuro: II - XII grossly intact.   Psych: Normal mood and affect.     Data Reviewed: I have personally reviewed following labs and imaging studies  CBC: Recent Labs  Lab 05/10/21 0507 05/14/21 0424  WBC 7.9 9.9  NEUTROABS 5.2  --   HGB 7.3* 7.7*  HCT 22.0* 24.0*  MCV 80.9 83.6  PLT 316 [id number]   Basic Metabolic Panel: Recent Labs  Lab 05/10/21 0507  NA 131*  K 4.2  CL 104  CO2 19*  GLUCOSE 108*  BUN 12  CREATININE 0.48*  CALCIUM 8.0*   GFR: Estimated Creatinine Clearance: 60.9 mL/min (A) (by C-G formula based on SCr of 0.48 mg/dL (L)). Liver Function Tests: No results for input(s): AST, ALT, ALKPHOS, BILITOT, PROT, ALBUMIN in the last 168 hours. No results for input(s): LIPASE, AMYLASE in the last 168 hours. No results for input(s): AMMONIA in the last 168 hours. Coagulation Profile: No results for input(s): INR, PROTIME in the last 168 hours. Cardiac Enzymes: No results for input(s): CKTOTAL, CKMB, CKMBINDEX, TROPONINI in the last 168 hours. BNP (last 3  results) No results for input(s): PROBNP in the last 8760 hours. HbA1C: No results for input(s): HGBA1C in the last 72 hours. CBG: No results for input(s): GLUCAP in the last 168 hours. Lipid Profile: No results for input(s): CHOL, HDL, LDLCALC, TRIG, CHOLHDL, LDLDIRECT in the last 72 hours. Thyroid Function Tests: No results for input(s): TSH, T4TOTAL, FREET4, T3FREE, THYROIDAB in the last 72 hours. Anemia Panel: No results for input(s): VITAMINB12, FOLATE, FERRITIN, TIBC, IRON, RETICCTPCT in the last 72 hours. Sepsis Labs: No results for input(s): PROCALCITON, LATICACIDVEN in the last 168 hours.  No results found for this or any previous visit (from the past 240 hour(s)).        Radiology Studies: No results found.      Scheduled Meds:  dexamethasone  4 mg Oral Daily   enoxaparin (LOVENOX)  injection  40 mg Subcutaneous Q24H   feeding supplement  237 mL Oral TID PC & HS   ferrous sulfate  325 mg Oral Q breakfast   folic acid  1 mg Oral Daily   melatonin  5 mg Oral QHS   midodrine  7.5 mg Oral TID WC   mirtazapine  15 mg Oral QHS   polyethylene glycol  17 g Oral Daily   Vitamin D (Ergocalciferol)  50,000 Units Oral Q7 days   Continuous Infusions:     LOS: 47 days    Enzo Bi, MD [facility] Pager [phone]  If 7PM-7AM, please contact night-coverage 05/16/2021, 6:19 PM

## 2021-05-16 NOTE — Plan of Care (Signed)

## 2021-05-17 NOTE — Plan of Care (Signed)
  Problem: Clinical Measurements: Goal: Respiratory complications will improve Outcome: Progressing   Problem: Nutrition: Goal: Adequate nutrition will be maintained Outcome: Progressing   Problem: Safety: Goal: Ability to remain free from injury will improve Outcome: Progressing   Problem: Skin Integrity: Goal: Risk for impaired skin integrity will decrease Outcome: Progressing   Problem: Education: Goal: Knowledge of General Education information will improve Description: Including pain rating scale, medication(s)/side effects and non-pharmacologic comfort measures Outcome: Not Progressing   Problem: Health Behavior/Discharge Planning: Goal: Ability to manage health-related needs will improve Outcome: Not Progressing

## 2021-05-17 NOTE — Progress Notes (Signed)
PROGRESS NOTE    Ryan [name]  [id number] DOB: Aug 30, 1961 DOA: 03/30/2021 PCP: Merryl Hacker, No    Brief Narrative:  Ryan [name] is a 60 y.o. male with medical history significant for history of polysubstance abuse including THC, cocaine, alcohol, remote history of tobacco use, history of right proximal femur inter trochanteric fracture status post right IM nail intertrochanteric fixation in 04/02/2021, does not follow-up with a PCP, presents to the emergency department for chief concerns of right hip pain.  Pain is chronic for several months, denies any trauma, difficulty ambulation due to pain. He also endorses an unintentional 35 pound weight loss in the last year, denies bright red blood per rectum and melena. patient was found to have L5 compression fracture, L3/4 severe stenosis.  She was also found to have widespread bony metastasis  from prostate cancer. Patient is a followed by oncology.  He needed SNF placement, not able to find accepting facility. He is intermittently refusing some meds.  He Was febrile over the weekend and his blood cultures show 1/4 bottles GPC, possibly contaminant. He has frequent urination and occasional dry cough. CXR unremarkable abut UA is positive for nitrites, rare bacteria. Urine cultures and repeat Blood cultures ordered, if they are negative will discontinue antibiotics in the next 24 to 48 hours.   Pt seen and examined at bedside, no new complaints.  Oral intake is remained poor.  Case discussed with registered dietitian.  Patient not meeting his nutritional goals and has been intermittently refusing medications and multivitamins.  Also intermittently refusing supplemental shakes.  In order to meet nutritional goals patient will likely need a PEG tube.  We will continue to encourage patient to eat and discuss possibility of enteral feeding.  7/27: Palliative care reconsulted to discuss goals of care.  After lengthy discussion with the patient and patient agreed  that he was interested in continuing aggressive treatment and at this point not entertaining de-escalation of care or comfort measures.  Stressed the importance of medication adherence and oral intake.  8/1: Patient's intake is remained poor.  His energy level is also been decreased over the past couple days.  Discussed with oncology, notified on patient's status.  Initiated Decadron 4 mg p.o. daily in an effort to improve this patient's appetite.   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 Neurosurgery consulted recommended not a surgical candidate Plan: Oncology following for inpatient cancer related needs. Patient has been intermittently refusing cancer related medications after repeated discussions he agrees to take the prescribed medications  Severe symptomatic anemia with folic acid deficiency [id number] Hb 7.0, transfused 1 unit of PRBC 7/24 Hb 8.1 stable  Monitor H&H Transfuse to keep hemoglobin greater than 7. --cont folic acid supplement   Hypomagnesemia --Monitor and replete as needed.    Hyponatremia Likely multifactorial secondary to possible SIADH versus hypovolemia Appears chronic and asymptomatic Fluid restriction and sodium tabs have been discontinued Plan: Encourage PO intake  Hypokalemia, resolved  non anion gap metabolic acidosis [id number] started po bicarb 650 TID x 4 days   Cancer cachexia  with severe protein calorie malnutrition Dietary on board recommend encouraging oral intake. Patient has been intermittently refusing multivitamins and protein supplements.  Not meeting his nutritional goals.  Has adamantly refused PEG tube placement.  Has agreed to attempt to eat more.  Remove all dietary restrictions.   Continue Decadron and Remeron  Left heel pain, resolved X rays of the foot ordered to evaluate for pathological fractures. Negative.   Hypotension: Asymptomatic, keep MAP greater than 65 --cont midodrine 7.5 mg TID   DVT prophylaxis: SQ Lovenox Code Status: Full Family Communication:  disposition Plan: Status is: Inpatient   Dispo: The patient is from: Home              Anticipated d/c is to: home w [facility]              Patient currently is medically stable to d/c.   Difficult to place patient Yes   Level of care: Med-Surg  Consultants:  None  Procedures: None  Antimicrobials:  None   Subjective: Oral intake appeared to be improving.    During progression, I posed the question why pt isn't getting strong enough to go home already after 48 days of hospital stay with PT/OT.  Therapist said that pt has refused about 3 out of 4 therapy sessions offered.    I had a talk with the pt with charge RN Caryl Pina present.  I told the pt the only reason he is in the hospital is for PT/OT to get him strong enough to go home, since no SNF would take him.  If he refuses any more PT/OT session, then he will be discharged right away, since there will then be no reason for him to be in the hospital.  Also the expectation is, with pt being of sound mind and intact arms and legs, pt's functional status should be able to improve enough after 2 weeks of PT/OT to the point that he can go home.    Pt voiced understanding and said he is ready for PT/OT.   Objective: Vitals:   05/17/21 0024 05/17/21 0456 05/17/21 0600 05/17/21 1227  BP: 110/66 (!) 101/55  94/66  Pulse: 84 74  84   Resp: '16 16  16  '$ Temp: 97.9 F (36.6 C) (!) 97.4 F (36.3 C)  (!) 97.4 F (36.3 C)  TempSrc: Oral Oral    SpO2: 100% 100%  100%  Weight:   46.4 kg   Height:        Intake/Output Summary (Last 24 hours) at 05/17/2021 1514 Last data filed at 05/17/2021 1358 Gross per 24 hour  Intake 480 ml  Output 725 ml  Net -245 ml     Filed Weights   05/04/21 0846 05/11/21 1515 05/17/21 0600  Weight: 46.8 kg 43.3 kg 46.4 kg    Examination:  Constitutional: NAD, AAOx3, cachectic HEENT: conjunctivae and lids normal, EOMI CV: No cyanosis.   RESP: normal respiratory effort, on RA Extremities: No effusions, edema in BLE SKIN: warm, dry Neuro: II - XII grossly intact.     Data Reviewed: I have personally reviewed following labs and imaging studies  CBC: Recent Labs  Lab 05/14/21 0424  WBC 9.9  HGB 7.7*  HCT 24.0*  MCV 83.6  PLT [id number]   Basic Metabolic Panel: No results for input(s): NA, K, CL, CO2, GLUCOSE, BUN, CREATININE, CALCIUM, MG, PHOS  in the last 168 hours.  GFR: Estimated Creatinine Clearance: 65.3 mL/min (A) (by C-G formula based on SCr of 0.48 mg/dL (L)). Liver Function Tests: No results for input(s): AST, ALT, ALKPHOS, BILITOT, PROT, ALBUMIN in the last 168 hours. No results for input(s): LIPASE, AMYLASE in the last 168 hours. No results for input(s): AMMONIA in the last 168 hours. Coagulation Profile: No results for input(s): INR, PROTIME in the last 168 hours. Cardiac Enzymes: No results for input(s): CKTOTAL, CKMB, CKMBINDEX, TROPONINI in the last 168 hours. BNP (last 3 results) No results for input(s): PROBNP in the last 8760 hours. HbA1C: No results for input(s): HGBA1C in the last 72 hours. CBG: No results for input(s): GLUCAP in the last 168 hours. Lipid Profile: No results for input(s): CHOL, HDL, LDLCALC, TRIG, CHOLHDL, LDLDIRECT in the last 72 hours. Thyroid Function Tests: No results for input(s): TSH, T4TOTAL, FREET4, T3FREE, THYROIDAB in the last 72  hours. Anemia Panel: No results for input(s): VITAMINB12, FOLATE, FERRITIN, TIBC, IRON, RETICCTPCT in the last 72 hours. Sepsis Labs: No results for input(s): PROCALCITON, LATICACIDVEN in the last 168 hours.  No results found for this or any previous visit (from the past 240 hour(s)).        Radiology Studies: No results found.      Scheduled Meds:  dexamethasone  4 mg Oral Daily   enoxaparin (LOVENOX) injection  40 mg Subcutaneous Q24H   feeding supplement  237 mL Oral TID PC & HS   ferrous sulfate  325 mg Oral Q breakfast   folic acid  1 mg Oral Daily   melatonin  5 mg Oral QHS   midodrine  7.5 mg Oral TID WC   mirtazapine  15 mg Oral QHS   polyethylene glycol  17 g Oral Daily   Vitamin D (Ergocalciferol)  50,000 Units Oral Q7 days   Continuous Infusions:     LOS: 48 days    Enzo Bi, MD [facility] Pager [phone]  If 7PM-7AM, please contact night-coverage 05/17/2021, 3:14 PM

## 2021-05-17 NOTE — Progress Notes (Signed)
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   OT comments  Ryan [name] had a good day in therapy today, engaging in bed mobility, transfers, toileting on BSC, grooming in sitting EOB, eating, getting fully dressed (in t-shirt and sweat pants), as well as singing several gospel songs. He was shaky on his feet in standing, requiring Mod A for transferring; also needed Mod A for dressing. Provided pt with encouragement to eat/drink and to follow POC. Provided therapeutic listening as pt expressed concern about his prognosis. Engaged in prayer with pt, per his request. POC, DC recs, frequency of treatment remain appropriate.    Follow Up Recommendations  SNF    Equipment Recommendations  None recommended by OT    Recommendations for Other Services      Precautions / Restrictions Precautions Precautions: Fall Required Braces or Orthoses: Spinal Brace;Other Brace Spinal Brace Comments: PRN for comfort Other Brace: LLE post op shoe, PRN for pain Restrictions Weight Bearing Restrictions: No       Mobility Bed Mobility Overal bed mobility: Needs Assistance Bed Mobility: Supine to Sit;Sit to Supine     Supine to sit: Supervision Sit to supine: Modified independent (Device/Increase time)   General bed mobility comments: Extra time, effort, encouragement required    Transfers Overall transfer level: Needs assistance Equipment used: Rolling walker (2 wheeled) Transfers: Sit to/from [facility] Sit to  Stand: Min assist;Mod assist Stand pivot transfers: Mod assist;Min assist            Balance Overall balance assessment: Needs assistance Sitting-balance support: Feet supported;No upper extremity supported Sitting balance-Leahy Scale: Good Sitting balance - Comments: pt able to maintain good balance seated EOB while engaging in grooming activities   Standing balance support: Single extremity supported;During functional activity Standing balance-Leahy Scale: Fair                             ADL either performed or assessed with clinical judgement   ADL Overall ADL's : Needs assistance/impaired Eating/Feeding: Modified independent   Grooming: Oral care;Brushing hair;Sitting;Set up;Modified independent;Wash/dry hands           Upper Body Dressing : Moderate assistance Upper Body Dressing Details (indicate cue type and reason): Mod A for threading shirt over head, pulling shirt down in back Lower Body Dressing: Moderate assistance Lower Body Dressing Details (indicate cue type and reason): Mod A for threading pants onto LE, pulling up to knees. Pt able to complete donning of pants, bridging from supine Toilet Transfer: BSC;Moderate assistance Toilet Transfer Details (indicate cue type and reason): Mod A for stand-pivot transfer and lowering pants Toileting- Clothing Manipulation and Hygiene: Sit to/from stand;Minimal assistance Toileting - Clothing Manipulation Details (indicate cue type and reason): Pt able to complete peri care in sitting and standing follwoing BM in BSC. Min A to maintain standing balance and assist with pulling up pants. Required reminder to wash hands post-toileting.     Functional mobility during ADLs: Rolling walker;Minimal assistance General ADL Comments: Pt eager to engage in therapy this day.  Vision Baseline Vision/History: No visual deficits Patient Visual Report: No change from baseline     Perception     Praxis       Cognition Arousal/Alertness: Awake/alert Behavior During Therapy: WFL for tasks assessed/performed Overall Cognitive Status: Within Functional Limits for tasks assessed                                 General Comments: Pt asking many questions this day about his condition, saying things such as, "Am I going to die soon?"        Exercises Other Exercises Other Exercises: Bed mobility, transfers, UB and LB dressing, toileting, grooming, sitting and standing balance tolerance, therapeutic activity, therapeutic listening   Shoulder Instructions       General Comments      Pertinent Vitals/ Pain       Pain Assessment: No/denies pain  Home Living                                          Prior Functioning/Environment              Frequency  Min 1X/week        Progress Toward Goals  OT Goals(current goals can now be found in the care plan section)  Progress towards OT goals: Progressing toward goals  Acute Rehab OT Goals Patient Stated Goal: to get stronger and to gain weight OT Goal Formulation: With patient Time For Goal Achievement: 05/25/21  Plan Discharge plan remains appropriate;Frequency remains appropriate    Co-evaluation                 AM-PAC OT "6 Clicks" Daily Activity     Outcome Measure   Help from another person eating meals?: A Little Help from another person taking care of personal grooming?: A Little Help from another person toileting, which includes using toliet, bedpan, or urinal?: A Lot Help from another person bathing (including washing, rinsing, drying)?: A Lot Help from another person to put on and taking off regular upper body clothing?: A Little Help from another person to put on and taking off regular lower body clothing?: A Lot 6 Click Score: 15    End of Session Equipment Utilized During Treatment: Rolling walker  OT Visit Diagnosis: Unsteadiness on feet (R26.81);Muscle weakness  (generalized) (M62.81)   Activity Tolerance Patient tolerated treatment well   Patient Left in bed;with call bell/phone within reach   Nurse Communication Mobility status        Time: [id number] OT Time Calculation (min): 39 min  Charges: OT General Charges $OT Visit: 1 Visit OT Treatments $Self Care/Home Management : 23-37 mins $Therapeutic Activity: 8-22 mins  Josiah Lobo, PhD, MS, OTR/L 05/17/21, 3:42 PM

## 2021-05-17 NOTE — Progress Notes (Signed)
PT Cancellation Note  Patient Details Name: Ryan [name] MRN: [id number] DOB: [birth year]   Cancelled Treatment:    Reason Eval/Treat Not Completed: Patient declined, no reason specified. Pt sleeping when PT entered room. Requesting PT to return later in the day. PT will re-attempt as able.   [name]. Fairly IV, PT, DPT Physical Therapist- [facility]  05/17/2021, 10:51 AM

## 2021-05-18 NOTE — Progress Notes (Signed)
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    PT Comments    Patient agreeable to participate with PT with minimal encouragement. He is Mod I with bed mobility today, no physical assistance required, however increased time and effort needed. Patient mildly short of breath with activity with Sp02 97% on room air. Patient also mildly dizzy with upright activity. Patient ambulated 28f with rolling walker with Min guard assistance for safety due to generalized weakness and fatigue with minimal activity.  He declined wearing the post-op shoe. Recommend to continue PT to maximize independence and facilitate return to prior level of function.    Follow Up Recommendations  SNF     Equipment Recommendations  3in1 (PT);Wheelchair cushion (measurements PT);Rolling walker with 5" wheels;Wheelchair (measurements PT);Hospital bed    Recommendations for Other Services       Precautions / Restrictions Precautions Precautions: Fall Required Braces or Orthoses: Spinal Brace;Other Brace Spinal Brace: Thoracolumbosacral orthotic Spinal Brace Comments: PRN for comfort Other Brace: LLE post op shoe, PRN for pain Restrictions Weight Bearing Restrictions: No    Mobility  Bed Mobility Overal bed mobility: Needs Assistance Bed Mobility: Supine to Sit;Sit to Supine     Supine to sit: Modified independent (Device/Increase time) Sit to supine: Modified independent (Device/Increase time)   General bed mobility comments: increased time  and effort for bed mobility. no physical assistance. patient with mild shortness of breath noted with activity. Sp02 97% on room air    Transfers Overall transfer level: Needs assistance Equipment used: Rolling walker (2 wheeled) Transfers: Sit to/from Stand Sit to Stand: Supervision         General transfer comment: cues for safety, technique, fall prevention. supervision provided for safety  Ambulation/Gait Ambulation/Gait assistance: Min guard Gait Distance (Feet): 24 Feet Assistive device: Rolling walker (2 wheeled) Gait Pattern/deviations: Narrow base of support (increased knee flexion with swing phase of gait bilaterally) Gait velocity: decreased   General Gait Details: patient ambulated to the door and back with Min guard for safety. patient fatigued with activity. no gross loss of balance. educated patient on importance of routine, short distance ambulation to maximize endurance and strength   Stairs             Wheelchair Mobility    Modified Rankin (Stroke Patients Only)       Balance Overall balance assessment: Needs assistance Sitting-balance support: Feet supported;No upper extremity supported Sitting balance-Leahy Scale: Good Sitting balance - Comments: no loss of balance in sitting positio. patient does report mild dizziness with upright activity                                    Cognition Arousal/Alertness: Awake/alert Behavior During Therapy: WFL for tasks assessed/performed Overall Cognitive Status: Within Functional Limits for tasks assessed                                 General Comments: patient able to follow all  commands without difficulty      Exercises Other Exercises Other Exercises: Bed mobility, grooming, self-feeding, therapeutic activity, discussion re: goals of care, DC planning    General Comments        Pertinent Vitals/Pain Pain Assessment: No/denies pain Pain Location: denies pain today     Home Living                      Prior Function            PT Goals (current goals can now be found in the care plan section) Acute Rehab PT Goals Patient Stated Goal: to get stronger and be discharged PT Goal Formulation: With patient Time For Goal Achievement: 05/24/21 Potential to Achieve Goals: Fair Progress towards PT goals: Progressing toward goals    Frequency    Min 2X/week      PT Plan Current plan remains appropriate    Co-evaluation              AM-PAC PT "6 Clicks" Mobility   Outcome Measure  Help needed turning from your back to your side while in a flat bed without using bedrails?: A Little Help needed moving from lying on your back to sitting on the side of a flat bed without using bedrails?: A Little Help needed moving to and from a bed to a chair (including a wheelchair)?: A Little Help needed standing up from a chair using your arms (e.g., wheelchair or bedside chair)?: A Little Help needed to walk in hospital room?: A Little Help needed climbing 3-5 steps with a railing? : A Lot 6 Click Score: 17    End of Session   Activity Tolerance: Patient limited by fatigue Patient left: in bed;with call bell/phone within reach;with bed alarm set   PT Visit Diagnosis: Unsteadiness on feet (R26.81);Muscle weakness (generalized) (M62.81);Difficulty in walking, not elsewhere classified (R26.2)     Time: [id number] PT Time Calculation (min) (ACUTE ONLY): 8 min  Charges:  $Therapeutic Activity: 8-22 mins                     Minna Merritts, PT, MPT    [name] 05/18/2021, 3:22 PM

## 2021-05-18 NOTE — Progress Notes (Signed)
PROGRESS NOTE    Ryan [name]  [id number] DOB: 1960/12/04 DOA: 03/30/2021 PCP: Merryl Hacker, No    Brief Narrative:  Ryan [name] is a 60 y.o. male with medical history significant for history of polysubstance abuse including THC, cocaine, alcohol, remote history of tobacco use, history of right proximal femur inter trochanteric fracture status post right IM nail intertrochanteric fixation in 04/02/2021, does not follow-up with a PCP, presents to the emergency department for chief concerns of right hip pain.  Pain is chronic for several months, denies any trauma, difficulty ambulation due to pain. He also endorses an unintentional 35 pound weight loss in the last year, denies bright red blood per rectum and melena. patient was found to have L5 compression fracture, L3/4 severe stenosis.  She was also found to have widespread bony metastasis  from prostate cancer. Patient is a followed by oncology.  He needed SNF placement, not able to find accepting facility. He is intermittently refusing some meds.  He Was febrile over the weekend and his blood cultures show 1/4 bottles GPC, possibly contaminant. He has frequent urination and occasional dry cough. CXR unremarkable abut UA is positive for nitrites, rare bacteria. Urine cultures and repeat Blood cultures ordered, if they are negative will discontinue antibiotics in the next 24 to 48 hours.   Pt seen and examined at bedside, no new complaints.  Oral intake is remained poor.  Case discussed with registered dietitian.  Patient not meeting his nutritional goals and has been intermittently refusing medications and multivitamins.  Also intermittently refusing supplemental shakes.  In order to meet nutritional goals patient will likely need a PEG tube.  We will continue to encourage patient to eat and discuss possibility of enteral feeding.  7/27: Palliative care reconsulted to discuss goals of care.  After lengthy discussion with the patient and patient agreed  that he was interested in continuing aggressive treatment and at this point not entertaining de-escalation of care or comfort measures.  Stressed the importance of medication adherence and oral intake.   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 Neurosurgery consulted recommended not a surgical candidate --pt has unrealistic expectation that he will be cured from cancer. Plan: --Oncology following for inpatient cancer related needs.  Severe symptomatic anemia with folic acid deficiency 123456 Hb 7.0, transfused 1 unit of PRBC Plan: Transfuse to keep hemoglobin greater than 7. --cont folic acid supplement   Hypomagnesemia --replete as needed.   Hyponatremia Likely multifactorial secondary to possible SIADH versus hypovolemia Appears chronic and asymptomatic Fluid restriction and sodium tabs have been discontinued Plan: Encourage PO intake   Hypokalemia, resolved  non anion gap metabolic acidosis [id number] started po bicarb 650 TID x 4 days   Cancer cachexia with severe protein calorie malnutrition Dietary on board recommend encouraging oral intake. Patient has been intermittently refusing multivitamins and protein supplements.  Not meeting his nutritional goals.  Has adamantly refused PEG tube placement.  Has agreed to attempt  to eat more.  Remove all dietary restrictions.   --appeared to be eating more  now. --Continue Decadron and Remeron  Left heel pain, resolved X rays of the foot neg for pathological fractures.   Hypotension: Asymptomatic. Started on midodrine. Plan: --keep MAP greater than 65 --taper midodrine down to 5 mg TID starting tomorrow   DVT prophylaxis: SQ Lovenox, pt refuses Code Status: Full Family Communication:  disposition Plan: Status is: Inpatient   Dispo: The patient is from: Home              Anticipated d/c is to: home w [facility]              Patient currently is medically stable to d/c.   Difficult to place patient Yes  Note:  During progression on 8/8, I posed the question why pt isn't getting strong enough to go home already after 48 days of hospital stay with PT/OT.  Therapist said that pt has refused a significant number of his therapy sessions.  I had a talk with the pt with charge RN Caryl Pina present on 8/8.  I told the pt the only reason he is in the hospital is for PT/OT to get him strong enough to go home, since no SNF would take him.  If he refuses any more PT/OT session, then he will be discharged right away, since there will then be no reason for him to be in the hospital.  Also the expectation is, with pt being of sound mind and intact arms and legs, pt's functional status should be able to improve enough after 2 weeks of PT/OT to the point that he can go home.    Pt voiced understanding and said he is ready for PT/OT.   Level of care: Med-Surg  Consultants:  None  Procedures: None  Antimicrobials:  None   Subjective: Worked with both PT and OT today.   Objective: Vitals:   05/18/21 0500 05/18/21 0852 05/18/21 1233 05/18/21 1547  BP: 112/69 110/70 104/61 (!) 149/73  Pulse: 80 85 94 91  Resp: '19 16 16 16  '$ Temp: 98.2 F (36.8 C) (!) 97.2 F (36.2 C) 98.2 F (36.8 C) 98.4 F (36.9 C)  TempSrc:      SpO2: 99% 100% 100% 100%  Weight:      Height:        Intake/Output Summary (Last 24 hours) at 05/18/2021 1714 Last data filed at  05/18/2021 1300 Gross per 24 hour  Intake 240 ml  Output 2520 ml  Net -2280 ml     Filed Weights   05/04/21 0846 05/11/21 1515 05/17/21 0600  Weight: 46.8 kg 43.3 kg 46.4 kg    Examination:  Constitutional: NAD, AAOx3 HEENT: conjunctivae and lids normal, EOMI CV: No cyanosis.   RESP: normal respiratory effort, on RA Extremities: No effusions, edema in BLE SKIN: warm, dry Neuro: II - XII grossly intact.   Psych: subdued mood and affect.     Data Reviewed: I have personally reviewed following labs and imaging studies  CBC: Recent Labs  Lab 05/14/21 0424  WBC 9.9  HGB 7.7*  HCT 24.0*  MCV 83.6  PLT [id number]   Basic Metabolic Panel: No results for input(s): NA, K, CL, CO2, GLUCOSE, BUN, CREATININE, CALCIUM, MG, PHOS in the last 168 hours.  GFR: Estimated Creatinine Clearance: 65.3 mL/min (A) (by C-G formula based on SCr of 0.48 mg/dL (L)). Liver Function Tests: No results for input(s): AST, ALT, ALKPHOS, BILITOT, PROT, ALBUMIN in the  last 168 hours. No results for input(s): LIPASE, AMYLASE in the last 168 hours. No results for input(s): AMMONIA in the last 168 hours. Coagulation Profile: No results for input(s): INR, PROTIME in the last 168 hours. Cardiac Enzymes: No results for input(s): CKTOTAL, CKMB, CKMBINDEX, TROPONINI in the last 168 hours. BNP (last 3 results) No results for input(s): PROBNP in the last 8760 hours. HbA1C: No results for input(s): HGBA1C in the last 72 hours. CBG: No results for input(s): GLUCAP in the last 168 hours. Lipid Profile: No results for input(s): CHOL, HDL, LDLCALC, TRIG, CHOLHDL, LDLDIRECT in the last 72 hours. Thyroid Function Tests: No results for input(s): TSH, T4TOTAL, FREET4, T3FREE, THYROIDAB in the last 72 hours. Anemia Panel: No results for input(s): VITAMINB12, FOLATE, FERRITIN, TIBC, IRON, RETICCTPCT in the last 72 hours. Sepsis Labs: No results for input(s): PROCALCITON, LATICACIDVEN in the last 168 hours.  No results  found for this or any previous visit (from the past 240 hour(s)).        Radiology Studies: No results found.      Scheduled Meds:  dexamethasone  4 mg Oral Daily   enoxaparin (LOVENOX) injection  40 mg Subcutaneous Q24H   feeding supplement  237 mL Oral TID PC & HS   ferrous sulfate  325 mg Oral Q breakfast   folic acid  1 mg Oral Daily   melatonin  5 mg Oral QHS   midodrine  7.5 mg Oral TID WC   mirtazapine  15 mg Oral QHS   polyethylene glycol  17 g Oral Daily   Vitamin D (Ergocalciferol)  50,000 Units Oral Q7 days   Continuous Infusions:     LOS: 49 days    Enzo Bi, MD [facility] Pager [phone]  If 7PM-7AM, please contact night-coverage 05/18/2021, 5:14 PM

## 2021-05-18 NOTE — Progress Notes (Signed)
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   OT comments  Pt more subdued and a little "down" compared to yesterday; however, still willing to participate in session, engaging in bed mobility, grooming, self-feeding -- stating he is not hungry and a little nauseous, but still working hard to get down 2 sandwiches for lunch. Ryan [name] continues to be interested in speaking about his prognosis, goals of care, and DC plans. Therapist discussed DC options with case manager.     Follow Up Recommendations  SNF    Equipment Recommendations  None recommended by OT    Recommendations for Other Services      Precautions / Restrictions Precautions Precautions: Fall Required Braces or Orthoses: Spinal Brace;Other Brace Spinal Brace: Thoracolumbosacral orthotic Spinal Brace Comments: PRN for comfort Other Brace: LLE post op shoe, PRN for pain Restrictions Weight Bearing Restrictions: No       Mobility Bed Mobility Overal bed mobility: Needs Assistance Bed Mobility: Supine to Sit;Sit to Supine     Supine to sit: Supervision Sit to supine: Modified independent (Device/Increase time)   General bed mobility comments: Extra time, effort, encouragement required    Transfers                      Balance Overall balance assessment: Needs assistance Sitting-balance support: Feet supported;No upper extremity supported Sitting balance-Leahy Scale: Good Sitting balance - Comments: pt able to maintain good balance  seated EOB while eating lunch                                   ADL either performed or assessed with clinical judgement   ADL Overall ADL's : Needs assistance/impaired Eating/Feeding: Modified independent Eating/Feeding Details (indicate cue type and reason): after encouragment, pt agreeable to eating                                         Vision Baseline Vision/History: No visual deficits Patient Visual Report: No change from baseline     Perception     Praxis      Cognition Arousal/Alertness: Awake/alert Behavior During Therapy: WFL for tasks assessed/performed Overall Cognitive Status: Within Functional Limits for tasks assessed                                 General Comments: Pt wanting to talk again today re: prognosis, DC options.        Exercises Other Exercises Other Exercises: Bed mobility, grooming, self-feeding, therapeutic activity, discussion re: goals of care, DC planning   Shoulder Instructions       General Comments      Pertinent Vitals/ Pain       Pain Assessment: No/denies pain Pain Location: denies pain today  Home Living  Prior Functioning/Environment              Frequency  Min 1X/week        Progress Toward Goals  OT Goals(current goals can now be found in the care plan section)  Progress towards OT goals: Progressing toward goals  Acute Rehab OT Goals Patient Stated Goal: to get stronger and to gain weight OT Goal Formulation: With patient Time For Goal Achievement: 05/25/21  Plan Discharge plan remains appropriate;Frequency remains appropriate    Co-evaluation                 AM-PAC OT "6 Clicks" Daily Activity     Outcome Measure   Help from another person eating meals?: A Little Help from another person taking care of personal grooming?: A Little Help from another person toileting, which includes  using toliet, bedpan, or urinal?: A Lot Help from another person bathing (including washing, rinsing, drying)?: A Lot Help from another person to put on and taking off regular upper body clothing?: A Little Help from another person to put on and taking off regular lower body clothing?: A Lot 6 Click Score: 15    End of Session    OT Visit Diagnosis: Unsteadiness on feet (R26.81);Muscle weakness (generalized) (M62.81)   Activity Tolerance Patient tolerated treatment well   Patient Left in bed;with call bell/phone within reach   Nurse Communication          Time: [id number] OT Time Calculation (min): 23 min  Charges: OT General Charges $OT Visit: 1 Visit OT Treatments $Self Care/Home Management : 23-37 mins  Josiah Lobo, PhD, MS, OTR/L 05/18/21, 2:10 PM

## 2021-05-18 NOTE — Progress Notes (Signed)
Nutrition Follow-up  DOCUMENTATION CODES:  Severe malnutrition in context of chronic illness, Underweight  INTERVENTION:  Encourage PO intake, continue current diet as ordered Continue double protein portions with meals. Continue Ensure Enlive QID, each supplement provides 350 kcal and 20 grams of protein . Continue snacks TID New measured weight to be taken weekly  Pt would benefit from feeding tube placement and nocturnal feeds if agreeable.  NUTRITION DIAGNOSIS:  Severe Malnutrition (in the context of chronic illness) related to poor appetite as evidenced by severe muscle depletion, severe fat depletion.  - ongoing  GOAL:  Patient will meet greater than or equal to 90% of their needs  - not meeting  MONITOR:  PO intake, Weight trends  REASON FOR ASSESSMENT:  Consult Assessment of nutrition requirement/status  ASSESSMENT:  60 y.o. male presented to ED for intermittent right lateral hip pain that is been ongoing for the past several weeks. Pt had surgery 10 months ago but reports he has not followed-up for pain. Imaging in ED concerning for marrow infiltrative process. PMH relevant for polysubstance abuse.  Found to have prostate cancer with widespread bony metastasis. Pt a difficult placement so cancer treatment was initiated inpatient. Receiving Zometea and Firmagon. First treatement was 04/02/21.   Some weight gain noted since last assessment. Discussed pt in rounds, RN reports that pt is consuming several ensures a day and visitors are bringing pt food from outside as well. Will continue to monitor. MD discussed with pt the importance of medical compliancy and with working with therapy. Pt agreed to increased participation.   Average Meal Intake: 6/26-6/28: ~32% x 4 recorded meals (0-100%) 6/29-7/6: 48% intake x 6 recorded meals (15-90%) 7/7-7/12: 50% intake x 5 recorded meals (0-100%) 7/13-7/19: 30% intake x 2 recorded meals (30%) 7/20-7/26: 63% intake x 4 recorded meals  (50-100%) 7/27-8/2: No meals recorded 8/3-8/9: 70% intake x 2 recorded meals  Nutritionally Relevant Medications: Scheduled Meds:  dexamethasone  4 mg Oral Daily   feeding supplement  237 mL Oral TID PC & HS   ferrous sulfate  325 mg Oral Q breakfast   folic acid  1 mg Oral Daily   mirtazapine  15 mg Oral QHS   polyethylene glycol  17 g Oral Daily   Vitamin D (Ergocalciferol)  50,000 Units Oral Q7 days   PRN Meds: bisacodyl, ondansetron   Labs Reviewed  Diet Order:   Diet Order             Diet regular Room service appropriate? Yes; Fluid consistency: Thin; Fluid restriction: Other (see comments)  Diet effective now                  EDUCATION NEEDS:  Education needs have been addressed  Skin:  Skin Assessment: Reviewed RN Assessment  Last BM:  8/8 - type 3  Height:  Ht Readings from Last 1 Encounters:  03/30/21 '6\' 5"'$  (1.956 m)   Weight:  Wt Readings from Last 1 Encounters:  05/17/21 46.4 kg   Ideal Body Weight:  94.5 kg  BMI:  Body mass index is 12.13 kg/m.  Estimated Nutritional Needs:  Kcal:  2400-2600 kcal/d Protein:  130-150 g/d Fluid:  >2.5L/d  Ranell Patrick, RD, LDN Clinical Dietitian Pager on [place]

## 2021-05-19 LAB — CBC
HCT: 22.7 % — ABNORMAL LOW (ref 39.0–52.0)
Hemoglobin: 7.2 g/dL — ABNORMAL LOW (ref 13.0–17.0)
MCH: 27 pg (ref 26.0–34.0)
MCHC: 31.7 g/dL (ref 30.0–36.0)
MCV: 85 fL (ref 80.0–100.0)
Platelets: 312 10*3/uL (ref 150–400)
RBC: 2.67 MIL/uL — ABNORMAL LOW (ref 4.22–5.81)
RDW: 18.6 % — ABNORMAL HIGH (ref 11.5–15.5)
WBC: 11.8 10*3/uL — ABNORMAL HIGH (ref 4.0–10.5)
nRBC: 1.6 % — ABNORMAL HIGH (ref 0.0–0.2)

## 2021-05-19 MED ORDER — MIDODRINE HCL 5 MG PO TABS
5.0000 mg | ORAL_TABLET | Freq: Three times a day (TID) | ORAL | Status: DC
  Administered 2021-05-19 – 2021-05-22 (×9): 5 mg via ORAL
  Filled 2021-05-19 (×9): qty 1

## 2021-05-19 NOTE — TOC Progression Note (Signed)
Transition of Care [facility]) - Progression Note    Patient Details  Name: Ryan [name] MRN: [id number] Date of Birth: [birth year]  Transition of Care [facility]) CM/SW Contact  Shelbie Hutching, RN Phone Number: 05/19/2021, 2:58 PM  Clinical Narrative:    Patient is doing much better with physical therapy and was able to walk to the nurses station today.  RNCM spoke with patient via phone and he is excited about being discharged.  MD would like to discharge tomorrow.  Patient reports that he will probably go to his Aunt's home in [place], he will not be going back to his apartment.   RNCM has reached out to [facility] on charity rotation for this week about charity referral for [facility] PT.   RNCM has attempted to call Lorna Few, aunt, about picking patient up tomorrow.  Patient reports that he would benefit from a 3 in 1- he has a walker and would need a pair of pants to go home in.     Expected Discharge Plan: [place] Barriers to Discharge: Inadequate or no insurance  Expected Discharge Plan and Services Expected Discharge Plan: [place]   Discharge Planning Services: CM Consult Post Acute Care Choice: [place] arrangements for the past 2 months: Apartment (lived in [place] before hospital)                 DME Arranged: N/A DME Agency: NA       HH Arranged: PT [place] Agency: [place] Date [place]: 05/19/21 Time [place]: [id number] Representative spoke with at [place]: [place] ([place]) Interventions    Readmission Risk Interventions Readmission Risk Prevention Plan 04/23/2021  Transportation Screening Complete  PCP or Specialist Appt within 3-5 Days Complete  HRI or [place] Complete  Social Work Consult for [place] Planning/Counseling Not Complete  SW consult not completed comments RNCM assigned to patient  Palliative Care Screening Complete  Medication Review Designer, fashion/clothing) Complete  Some recent data might be hidden

## 2021-05-19 NOTE — Progress Notes (Addendum)
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    PT Comments    Pt was long sitting in bed upon arriving. He agrees to session with much less encouragement required. He was able to exit bed (flat) without physical assistance. Stood to [employer] and ambulate 120 ft without LOB. Does ambulate with narrow BOS but overall was steady. Pt elects not to wear post op shoe/TLSO which are for comfort only. Chief Strategy Officer discussed case with MD and feels pt can safely manage at home. DC recs updated to HHPT. Pt is eager to DC and hoping to go to sister's tomorrow. Will see pt tomorrow and continue to follow per current POC.    Follow Up Recommendations  Home health PT     Equipment Recommendations  3in1 (PT)       Precautions / Restrictions Precautions Precautions: Fall Required Braces or Orthoses: Other Brace (pt has TLSO but unwilling to wear) Spinal Brace: Thoracolumbosacral orthotic Spinal Brace Comments: PRN for comfort Restrictions Weight Bearing Restrictions: No    Mobility  Bed Mobility Overal bed mobility: Needs Assistance Bed Mobility: Supine to Sit;Sit to Supine     Supine to sit: Modified independent (Device/Increase time) Sit to supine: Modified independent (Device/Increase time)   General bed mobility comments: no physical assistance or VCs for getting out/in bed    Transfers Overall transfer level: Modified independent Equipment used: Rolling walker (2 wheeled) Transfers: Sit to/from Stand Sit to Stand: Supervision          General transfer comment: no physical assistance required to stand or ambulate  Ambulation/Gait Ambulation/Gait assistance: Supervision Gait Distance (Feet): 120 Feet Assistive device: Rolling walker (2 wheeled) Gait Pattern/deviations: Narrow base of support;Trunk flexed Gait velocity: decreased   General Gait Details: pt was able to ambulate 120 ft without physical assistance. no LOB. distance limited by patient. vitals were stable throughout on rm air     Balance Overall balance assessment: Needs assistance Sitting-balance support: Feet supported;No upper extremity supported Sitting balance-Leahy Scale: Good     Standing balance support: Bilateral upper extremity supported;During functional activity Standing balance-Leahy Scale: Good Standing balance comment: no LOB with UE support during dynamic activity        Cognition Arousal/Alertness: Awake/alert Behavior During Therapy: WFL for tasks assessed/performed Overall Cognitive Status: Within Functional Limits for tasks assessed    General Comments: Pt is alert and cooperative today. willing to ambulate without max encouragament             Pertinent Vitals/Pain Pain Assessment: No/denies pain Pain Score: 0-No pain Pain Intervention(s): Limited activity within patient's tolerance;Monitored during session     PT Goals (current goals can now be found in the care plan section) Acute Rehab PT Goals Patient Stated Goal: I'm ready to get out of here." Progress towards PT goals: Progressing toward goals    Frequency    Min 2X/week      PT Plan Discharge plan needs to be updated    Co-evaluation     PT goals addressed during session: Mobility/safety with mobility;Balance;Proper use of DME;Strengthening/ROM  AM-PAC PT "6 Clicks" Mobility   Outcome Measure  Help needed turning from your back to your side while in a flat bed without using bedrails?: None Help needed moving from lying on your  back to sitting on the side of a flat bed without using bedrails?: None Help needed moving to and from a bed to a chair (including a wheelchair)?: None Help needed standing up from a chair using your arms (e.g., wheelchair or bedside chair)?: A Little Help needed to walk in hospital room?: A Little Help needed climbing 3-5 steps with a railing? : A Little 6 Click Score: 21    End of Session   Activity Tolerance: Patient tolerated treatment well Patient left: in bed;with call bell/phone within reach;with bed alarm set Nurse Communication: Mobility status PT Visit Diagnosis: Unsteadiness on feet (R26.81);Muscle weakness (generalized) (M62.81);Difficulty in walking, not elsewhere classified (R26.2)     Time: 1410-1420 PT Time Calculation (min) (ACUTE ONLY): 10 min  Charges:  $Gait Training: 8-22 mins                    Julaine Fusi PTA 05/19/21, 3:14 PM

## 2021-05-19 NOTE — TOC Progression Note (Signed)
Transition of Care [facility]) - Progression Note    Patient Details  Name: Ryan [name] MRN: [id number] Date of Birth: 1960/12/23  Transition of Care [facility]) CM/SW [place], RN Phone Number: 05/19/2021, 4:43 PM  Clinical Narrative:    Durene Cal email for follow up to [facility] [place] for [facility] application.    Expected Discharge Plan: [place] Barriers to Discharge: Inadequate or no insurance  Expected Discharge Plan and Services Expected Discharge Plan: [place]   Discharge Planning Services: CM Consult Post Acute Care Choice: [place] arrangements for the past 2 months: Apartment (lived in [place] before hospital)                 DME Arranged: N/A DME Agency: NA       HH Arranged: PT [place] Agency: [place] Date [place]: 05/19/21 Time [place]: [id number] Representative spoke with at [place]: [place] ([place]) Interventions    Readmission Risk Interventions Readmission Risk Prevention Plan 04/23/2021  Transportation Screening Complete  PCP or Specialist Appt within 3-5 Days Complete  HRI or [place] Complete  Social Work Consult for [place] Planning/Counseling Not Complete  SW consult not completed comments RNCM assigned to patient  Palliative Care Screening Complete  Medication Review Press photographer) Complete  Some recent data might be hidden

## 2021-05-19 NOTE — Progress Notes (Signed)
[place] at [place] NAME: Ryan [name]    MR#:  [id number]  DATE OF BIRTH:  27-Sep-1961  SUBJECTIVE:   patient ate his whole grill cheese sandwich and he has been drinking fluids. Patient tells me he walks to the bathroom with the walker.  REVIEW OF SYSTEMS:   Review of Systems  Constitutional:  Positive for malaise/fatigue and weight loss. Negative for chills and fever.  HENT:  Negative for ear discharge, ear pain and nosebleeds.   Eyes:  Negative for blurred vision, pain and discharge.  Respiratory:  Negative for sputum production, shortness of breath, wheezing and stridor.   Cardiovascular:  Negative for chest pain, palpitations, orthopnea and PND.  Gastrointestinal:  Negative for abdominal pain, diarrhea, nausea and vomiting.  Genitourinary:  Negative for frequency and urgency.  Musculoskeletal:  Negative for back pain and joint pain.  Neurological:  Positive for weakness. Negative for sensory change, speech change and focal weakness.  Psychiatric/Behavioral:  Negative for depression and hallucinations. The patient is not nervous/anxious.   Tolerating Diet:yes Tolerating PT: patient ambulated with physical therapy using walker without much assistance up to the nurses station today.  DRUG ALLERGIES:  No Known Allergies  VITALS:  Blood pressure (!) 105/57, pulse 93, temperature 98.1 F (36.7 C), resp. rate 18, height '6\' 5"'$  (1.956 m), weight 46.4 kg, SpO2 99 %.  PHYSICAL EXAMINATION:   Physical Exam Constitutional: NAD, AAOx3 thin ,cachectic HEENT: conjunctivae and lids normal, EOMI CV: No cyanosis.   RESP: normal respiratory effort, on RA Extremities: No effusions, edema in BLE SKIN: warm, dry Neuro: II - XII grossly intact.   Psych: mood and affect normal.   LABORATORY PANEL:  CBC Recent Labs  Lab 05/19/21 0600  WBC 11.8*  HGB 7.2*  HCT 22.7*  PLT 312    Chemistries  No results for input(s): NA, K, CL, CO2,  GLUCOSE, BUN, CREATININE, CALCIUM, MG, AST, ALT, ALKPHOS, BILITOT in the last 168 hours.  Invalid input(s): GFRCGP Cardiac Enzymes No results for input(s): TROPONINI in the last 168 hours. RADIOLOGY:  No results found. ASSESSMENT AND PLAN:  Ryan [name] is a 60 y.o. male with medical history significant for history of polysubstance abuse including THC, cocaine, alcohol, remote history of tobacco use, history of right proximal femur inter trochanteric fracture status post right IM nail intertrochanteric fixation in 04/02/2021, does not follow-up with a PCP, presents to the emergency department for chief concerns of right hip pain.  Pain is chronic for several months, denies any trauma, difficulty ambulation due to pain. He also endorses an unintentional 35 pound weight loss in the last year, denies bright red blood per rectum and melena. patient was found to have L5 compression fracture, L3/4 severe stenosis.  He was also found to have widespread bony metastasis  from prostate cancer.  Metastatic prostate cancer --with mets to bone with associated L5 compression fracture, L3-L4 severe spinal stenosis with right lower extremity weakness. --Neurosurgery consulted recommended not a surgical candidate --pt has unrealistic expectation that he will be cured from cancer. --Oncology following for inpatient cancer related needs. Follow-up with [name] as outpatient   Severe symptomatic anemia with folic acid deficiency 123456 Hb 7.0, transfused 1 unit of PRBC Plan: Transfuse to keep hemoglobin greater than 7. --cont folic acid supplement   Hypomagnesemia --replete as needed.   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 --appeared to be eating more now. --Continue Decadron and Remeron   Left heel pain, resolved X rays of the foot neg for pathological fractures.   Hypotension: Asymptomatic. Started on midodrine. --keep MAP greater than 65 --cont midodrine down to 5 mg TID     DVT prophylaxis: SQ Lovenox, pt refuses Code Status: Full Family Communication: none disposition Plan: Status is: Inpatient     Dispo: The patient is from: Home              Anticipated d/c is to: home w [facility]              Patient currently is medically stable to d/c.              Difficult to place patient Yes   patient ambulated very well with physical therapy using rolling walker today. It was witnessed by me and RN. Overall improving. Patient is agreeable to go home. TOC for discharge planning-- charity PT, equipment.      TOTAL TIME TAKING CARE OF THIS PATIENT: 25 minutes.  >50% time spent on counselling and coordination of care  Note: This dictation was prepared with Dragon dictation along with smaller phrase technology. Any transcriptional errors that result from this process are unintentional.  Fritzi Mandes M.D    [facility]   CC: Primary care physician; Pcp, No Patient ID: Ryan [name], male   DOB: 12/10/1960, 60 y.o.   MRN: [id number]

## 2021-05-19 NOTE — TOC Progression Note (Signed)
Transition of Care [facility]) - Progression Note    Patient Details  Name: Ryan [name] MRN: [id number] Date of Birth: [birth year]  Transition [facility] Care [facility]) CM/SW Contact  Shelbie Hutching, RN Phone Number: 05/19/2021, 3:59 PM  Clinical Narrative:    Message left for [facility] for return call.    Expected Discharge Plan: [place] Barriers to Discharge: Inadequate or no insurance  Expected Discharge Plan and Services Expected Discharge Plan: [place]   Discharge Planning Services: CM Consult Post Acute Care Choice: [place] arrangements for the past 2 months: Apartment (lived in [place] before hospital)                 DME Arranged: N/A DME Agency: NA       HH Arranged: PT [place] Agency: [place] Date [place]: 05/19/21 Time [place]: [id number] Representative spoke with at [place]: [place] ([place]) Interventions    Readmission Risk Interventions Readmission Risk Prevention Plan 04/23/2021  Transportation Screening Complete  PCP or Specialist Appt within 3-5 Days Complete  HRI or [employer] Complete  Social Work Consult for [place] Planning/Counseling Not Complete  SW consult not completed comments RNCM assigned to patient  Palliative Care Screening Complete  Medication Review Press photographer) Complete  Some recent data might be hidden

## 2021-05-20 NOTE — Progress Notes (Signed)
[place] at [place] NAME: Ryan [name]    MR#:  [id number]  DATE OF BIRTH:  Dec 04, 1960  SUBJECTIVE:   patient ambulated about 150 feet with physical therapy. Denies any complaints. REVIEW OF SYSTEMS:   Review of Systems  Constitutional:  Positive for malaise/fatigue and weight loss. Negative for chills and fever.  HENT:  Negative for ear discharge, ear pain and nosebleeds.   Eyes:  Negative for blurred vision, pain and discharge.  Respiratory:  Negative for sputum production, shortness of breath, wheezing and stridor.   Cardiovascular:  Negative for chest pain, palpitations, orthopnea and PND.  Gastrointestinal:  Negative for abdominal pain, diarrhea, nausea and vomiting.  Genitourinary:  Negative for frequency and urgency.  Musculoskeletal:  Negative for back pain and joint pain.  Neurological:  Positive for weakness. Negative for sensory change, speech change and focal weakness.  Psychiatric/Behavioral:  Negative for depression and hallucinations. The patient is not nervous/anxious.   Tolerating Diet:yes Tolerating PT: patient ambulated with physical therapy using walker without much assistance up to the nurses station.  DRUG ALLERGIES:  No Known Allergies  VITALS:  Blood pressure (!) 100/56, pulse 88, temperature 98.6 F (37 C), resp. rate 20, height '6\' 5"'$  (1.956 m), weight 46.4 kg, SpO2 100 %.  PHYSICAL EXAMINATION:   Physical Exam Constitutional: NAD, AAOx3 thin ,cachectic HEENT: conjunctivae and lids normal, EOMI CV: No cyanosis.   RESP: normal respiratory effort, on RA Extremities: No effusions, edema in BLE SKIN: warm, dry Neuro: II - XII grossly intact.   Psych: mood and affect normal.   LABORATORY PANEL:  CBC Recent Labs  Lab 05/19/21 0600  WBC 11.8*  HGB 7.2*  HCT 22.7*  PLT 312     Chemistries  No results for input(s): NA, K, CL, CO2, GLUCOSE, BUN, CREATININE, CALCIUM, MG, AST, ALT, ALKPHOS, BILITOT in the  last 168 hours.  Invalid input(s): GFRCGP Cardiac Enzymes No results for input(s): TROPONINI in the last 168 hours. RADIOLOGY:  No results found. ASSESSMENT AND PLAN:  Ryan [name] is a 60 y.o. male with medical history significant for history of polysubstance abuse including THC, cocaine, alcohol, remote history of tobacco use, history of right proximal femur inter trochanteric fracture status post right IM nail intertrochanteric fixation in 04/02/2021, does not follow-up with a PCP, presents to the emergency department for chief concerns of right hip pain.  Pain is chronic for several months, denies any trauma, difficulty ambulation due to pain. He also endorses an unintentional 35 pound weight loss in the last year, denies bright red blood per rectum and melena. patient was found to have L5 compression fracture, L3/4 severe stenosis.  He was also found to have widespread bony metastasis  from prostate cancer.  Metastatic prostate cancer --with mets to bone with associated L5 compression fracture, L3-L4 severe spinal stenosis with right lower extremity weakness. --Neurosurgery consulted recommended not a surgical candidate --pt has unrealistic expectation that he will be cured from cancer. --Oncology following for inpatient cancer related needs. Follow-up with [name] as outpatient   Severe symptomatic anemia with folic acid deficiency 123456 Hb 7.0, transfused 1 unit of PRBC --Transfuse to keep hemoglobin greater than 7. --cont folic acid supplement   Hypomagnesemia --replete as needed.   Hyponatremia --Likely multifactorial secondary to possible SIADH versus hypovolemia --Appears chronic and asymptomatic --Encourage PO intake    non anion gap metabolic acidosis 99991111 started po bicarb 650 TID x 4 days   Cancer cachexia  with severe protein calorie malnutrition Dietary on board recommend encouraging oral intake. --appeared to be eating more now. --Continue Decadron and Remeron    Left heel pain, resolved X rays of the foot neg for pathological fractures.   Hypotension: Asymptomatic. Started on midodrine. --keep MAP greater than 65 --cont midodrine down to 5 mg TID     DVT prophylaxis: SQ Lovenox, pt refuses Code Status: Full Family Communication: none disposition Plan: Status is: Inpatient     Dispo: The patient is from: Home              Anticipated d/c is to: home w [facility]              Patient currently is medically stable to d/c.              Difficult to place patient Yes   8/10--patient ambulated very well with physical therapy using rolling walker today. It was witnessed by me and RN. Overall improving. Patient is agreeable to go home. TOC for discharge planning-- charity PT, equipment.  8/11-- pt's aunt cecilia has refused to take pt home with her!! No other close family.        TOTAL TIME TAKING CARE OF THIS PATIENT: 20 minutes.  >50% time spent on counselling and coordination of care  Note: This dictation was prepared with Dragon dictation along with smaller phrase technology. Any transcriptional errors that result from this process are unintentional.  Fritzi Mandes M.D    [facility]   CC: Primary care physician; Pcp, No Patient ID: Ryan [name], male   DOB: June 11, 1961, 60 y.o.   MRN: [id number]

## 2021-05-20 NOTE — Progress Notes (Signed)
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    PT Comments    Pt was long sitting in bed watching Tv upon arriving. He is A and O and agreeable to session with only a little encouragement. Pt was easily able to dress himself while in bed prior to getting OOB and ambulating without physical assistance. Used RW throughout to ambulate 150 ft. No LOB or unsteadiness however pt endorses bilateral feet numbness. Overall pt tolerated session well. He returned to bed even when encouraged to sit up in recliner. Acute PT will update goals and continue to follow as needed.    Follow Up Recommendations  Home health PT     Equipment Recommendations  Rolling walker with 5" wheels;3in1 (PT)       Precautions / Restrictions Precautions Precautions: Fall Required Braces or Orthoses: Other Brace (pt has TLSO and post op shoes however elects not to wear) Spinal Brace: Thoracolumbosacral orthotic Spinal Brace Comments: PRN for comfort Other Brace: LLE post op shoe, PRN for pain Restrictions Weight Bearing Restrictions: No    Mobility  Bed Mobility Overal bed mobility: Modified Independent Bed Mobility: Supine to Sit;Sit to Supine Rolling: Modified independent (Device/Increase time)   Supine to sit: Modified independent (Device/Increase time) Sit to supine: Modified independent (Device/Increase time)   General bed mobility comments: no physical assistance or Vcs for technique with exiting/re-entering bed    Transfers Overall  transfer level: Modified independent Equipment used: Rolling walker (2 wheeled) Transfers: Sit to/from Stand Sit to Stand: Modified independent (Device/Increase time)         General transfer comment: no physical assistance or vcs to stand form lowest bed height  Ambulation/Gait Ambulation/Gait assistance: Supervision Gait Distance (Feet): 150 Feet Assistive device: Rolling walker (2 wheeled) Gait Pattern/deviations: Narrow base of support;Trunk flexed Gait velocity: decreased   General Gait Details: no LOB or unsteadiness during ambultion with RW    Balance Overall balance assessment: Modified Independent         Standing balance support: Bilateral upper extremity supported;During functional activity Standing balance-Leahy Scale: Good Standing balance comment: no LOB with UE support. Discussed need for use of RW at DC when at aunts house        Cognition Arousal/Alertness: Awake/alert Behavior During Therapy: WFL for tasks assessed/performed Overall Cognitive Status: Within Functional Limits for tasks assessed        General Comments: Pt is A and O x 4       Pertinent Vitals/Pain Pain Assessment: 0-10 Pain Score: 3  Faces Pain Scale: Hurts a little bit Pain Location: BLE(feet) Pain Descriptors / Indicators: Discomfort Pain Intervention(s): Limited activity within patient's tolerance;Monitored during session;Premedicated before session;Repositioned     PT Goals (current goals can now be found in the care plan section) Acute Rehab PT Goals Patient Stated Goal: I'm ready to get out of here." Progress towards PT goals: Progressing toward goals    Frequency    Min 2X/week      PT Plan Current plan remains appropriate    Co-evaluation     PT goals addressed during session: Mobility/safety  with mobility;Balance;Proper use of DME;Strengthening/ROM        AM-PAC PT "6 Clicks" Mobility   Outcome Measure  Help needed turning from your back to your  side while in a flat bed without using bedrails?: None Help needed moving from lying on your back to sitting on the side of a flat bed without using bedrails?: None Help needed moving to and from a bed to a chair (including a wheelchair)?: None Help needed standing up from a chair using your arms (e.g., wheelchair or bedside chair)?: None Help needed to walk in hospital room?: A Little Help needed climbing 3-5 steps with a railing? : A Little 6 Click Score: 22    End of Session Equipment Utilized During Treatment: Gait belt Activity Tolerance: Patient tolerated treatment well Patient left: in bed;with call bell/phone within reach;with bed alarm set Nurse Communication: Mobility status PT Visit Diagnosis: Unsteadiness on feet (R26.81);Muscle weakness (generalized) (M62.81);Difficulty in walking, not elsewhere classified (R26.2)     Time: [id number] PT Time Calculation (min) (ACUTE ONLY): 23 min  Charges:  $Gait Training: 8-22 mins $Therapeutic Activity: 8-22 mins                     Julaine Fusi PTA 05/20/21, 10:08 AM

## 2021-05-20 NOTE — TOC Progression Note (Signed)
Transition of Care [facility]) - Progression Note    Patient Details  Name: Ryan [name] MRN: [id number] Date of Birth: [birth year]  Transition of Care [facility]) CM/SW Contact  Shelbie Hutching, RN Phone Number: 05/20/2021, 1:19 PM  Clinical Narrative:    RNCM spoke with Ryan [name], patient's aunt yesterday afternoon.  She informs RNCM that patient has been evicted from his apartment and it was infested with bed bugs.  Ryan [name] is in her 26's and cannot care for the patient.  Patient has no family that can take him in, children are estranged.   RNCM attempted to contact Medicaid worker listed as working on his case but number is no longer in service.  Message left for DSS supervisor as to next steps that TOC can take.   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  Disability has also been applied for.     Expected Discharge Plan: [place] Barriers to Discharge: Inadequate or no insurance  Expected Discharge Plan and Services Expected Discharge Plan: [place]   Discharge Planning Services: CM Consult Post Acute Care Choice: [place] arrangements for the past 2 months: Apartment (lived in [place] before hospital)                 DME Arranged: N/A DME Agency: NA       HH Arranged: PT [place] Agency: [place] Date [place]: 05/19/21 Time [place]: [id number] Representative spoke with at [place]: [place] ([place]) Interventions    Readmission Risk Interventions Readmission Risk Prevention Plan 04/23/2021  Transportation Screening Complete  PCP or Specialist Appt within 3-5 Days Complete  HRI or [place] Complete  Social Work Consult for [place] Planning/Counseling Not Complete  SW consult not completed comments RNCM assigned to patient  Palliative Care Screening Complete  Medication Review Press photographer)  Complete  Some recent data might be hidden

## 2021-05-20 NOTE — TOC Progression Note (Signed)
Transition of Care [facility]) - Progression Note    Patient Details  Name: Ryan [name] MRN: [id number] Date of Birth: January 06, 1961  Transition of Care [facility]) CM/SW Contact  Shelbie Hutching, RN Phone Number: 05/20/2021, 4:34 PM  Clinical Narrative:    RNCM has reached out to [facility] APS and made a report.  Message also left with Medicaid case worker at [phone].    Expected Discharge Plan: [place] Barriers to Discharge: Inadequate or no insurance  Expected Discharge Plan and Services Expected Discharge Plan: [place]   Discharge Planning Services: CM Consult Post Acute Care Choice: [place] arrangements for the past 2 months: Apartment (lived in [place] before hospital)                 DME Arranged: N/A DME Agency: NA       HH Arranged: PT [place] Agency: [place] Date [place]: 05/19/21 Time [place]: [id number] Representative spoke with at [place]: [place] ([place]) Interventions    Readmission Risk Interventions Readmission Risk Prevention Plan 04/23/2021  Transportation Screening Complete  PCP or Specialist Appt within 3-5 Days Complete  HRI or [place] Complete  Social Work Consult for [place] Planning/Counseling Not Complete  SW consult not completed comments RNCM assigned to patient  Palliative Care Screening Complete  Medication Review Press photographer) Complete  Some recent data might be hidden

## 2021-05-21 DIAGNOSIS — I959 Hypotension, unspecified: Secondary | ICD-10-CM

## 2021-05-21 NOTE — Progress Notes (Signed)
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    PT Comments    Patient endorsing fatigue, reports recent gait trial in hallway with nursing prior to session (no staff able to verify this occurred).  Agreeable to participation with session, min/mod encouragement required.  Able to complete all bed mobility, transfers and gait (100') with RW, close sup/mod indep.  Generally slow and guarded with all movement activities, but no buckling or LOB.  Fatigues quickly; generally self-limiting in further mobility progression.   Offered shower, OOB to chair; unable to facilitate despite encouragement. Goals updated to reflect continued POC for patient-needs additional work on functional endurance, dynamic balance (with emphasis on simulated home activities) and overall safety awareness.  Will consider transitioning to mobility specialist in future, as patient likely approaching new baseline.     Follow Up Recommendations  Home health PT     Equipment Recommendations  Rolling walker with 5" wheels;3in1 (PT)    Recommendations for Other Services       Precautions / Restrictions Precautions Precautions: Fall Required Braces or Orthoses: Other Brace Spinal Brace: Thoracolumbosacral orthotic Spinal Brace Comments: PRN for comfort Other Brace: LLE post op shoe, PRN for pain    Mobility  Bed Mobility Overal bed mobility: Modified Independent Bed Mobility: Supine to Sit;Sit to Supine Rolling: Modified independent (Device/Increase  time) Sidelying to sit: Modified independent (Device/Increase time)       General bed mobility comments: indep negotiates bed surfaces and movement transition; self-assisting LEs at times    Transfers Overall transfer level: Modified independent Equipment used: Rolling walker (2 wheeled) Transfers: Sit to/from Stand Sit to Stand: Modified independent (Device/Increase time)         General transfer comment: Requires UE support for lift off and stabilization; mild use of posterior surface of bilat LEs against edge of bed for added support  Ambulation/Gait Ambulation/Gait assistance: Supervision Gait Distance (Feet): 100 Feet Assistive device: Rolling walker (2 wheeled)       General Gait Details: reciprocal stepping pattern with narrowed BOS, mild bilat LE IR (R > L) throughout gait cycle; forward flexed posture with mod WBing bilat UEs.  Slow and guarded, but no overt buckling or LOB.  Fatigues quickly; somewhat self-limiting in distance   Stairs             Wheelchair Mobility    Modified Rankin (Stroke Patients Only)       Balance Overall balance assessment: Needs assistance Sitting-balance support: No upper extremity supported;Feet supported Sitting balance-Leahy Scale: Good     Standing balance support: Bilateral upper extremity supported Standing balance-Leahy Scale: Fair                              Cognition Arousal/Alertness: Awake/alert Behavior During Therapy: Flat affect Overall Cognitive Status: Within Functional Limits for tasks assessed                                 General Comments: Alert and oriented;  min encouragement for participation      Exercises Other Exercises Other Exercises: Supine LE therex, 1x15, active ROM: ankle pumps, heel slides, SLR and bridging.  Intermittent rest breaks with supine therex; cuing for slow, controlled movement for optimal benefit of therex. Other Exercises: Lower body  dressing, supine/long sitting, mod indep (with set up).  Good LE ROM, fair/good participation with functional tasks.    General Comments        Pertinent Vitals/Pain Pain Assessment: No/denies pain Pain Intervention(s): Limited activity within patient's tolerance;Monitored during session;Repositioned    Home Living                      Prior Function            PT Goals (current goals can now be found in the care plan section) Acute Rehab PT Goals Patient Stated Goal: I'm ready to get out of here." PT Goal Formulation: With patient Time For Goal Achievement: 06/04/21 Potential to Achieve Goals: Fair Progress towards PT goals: Progressing toward goals    Frequency    Min 2X/week      PT Plan Current plan remains appropriate    Co-evaluation              AM-PAC PT "6 Clicks" Mobility   Outcome Measure  Help needed turning from your back to your side while in a flat bed without using bedrails?: None Help needed moving from lying on your back to sitting on the side of a flat bed without using bedrails?: None Help needed moving to and from a bed to a chair (including a wheelchair)?: None Help needed standing up from a chair using your arms (e.g., wheelchair or bedside chair)?: None Help needed to walk in hospital room?: A Little Help needed climbing 3-5 steps with a railing? : A Little 6 Click Score: 22    End of Session Equipment Utilized During Treatment: Gait belt Activity Tolerance: Patient tolerated treatment well Patient left: in bed;with call bell/phone within reach;with bed alarm set Nurse Communication: Mobility status PT Visit Diagnosis: Unsteadiness on feet (R26.81);Muscle weakness (generalized) (M62.81);Difficulty in walking, not elsewhere classified (R26.2)     Time: 0950-1006 PT Time Calculation (min) (ACUTE ONLY): 16 min  Charges:  $Therapeutic Activity: 8-22 mins                     Aliyah Abeyta H. Owens Shark, PT, DPT, NCS 05/21/21, 10:23  AM [phone]

## 2021-05-21 NOTE — Progress Notes (Signed)
Patient ID: Ryan [name], male   DOB: [birth year], 60 y.o.   MRN: [id number] [facility] PROGRESS NOTE  Ryan [name] [id number] DOB: Apr 20, 1961 DOA: 03/30/2021 PCP: Pcp, No  HPI/Subjective: Patient feels okay.  Offers no complaints except for a little weakness.  Walked around okay.  Was evicted from his home.  Objective: Vitals:   05/21/21 0828 05/21/21 1153  BP: 109/69 (!) 91/50  Pulse: 96 87  Resp: 16 16  Temp: 98.6 F (37 C) 98.4 F (36.9 C)  SpO2: 100% 99%    Intake/Output Summary (Last 24 hours) at 05/21/2021 1434 Last data filed at 05/21/2021 1152 Gross per 24 hour  Intake 240 ml  Output 850 ml  Net -610 ml   Filed Weights   05/04/21 0846 05/11/21 1515 05/17/21 0600  Weight: 46.8 kg 43.3 kg 46.4 kg    ROS: Review of Systems  Respiratory:  Negative for shortness of breath.   Cardiovascular:  Negative for chest pain.  Gastrointestinal:  Negative for abdominal pain, nausea and vomiting.  Exam: Physical Exam HENT:     Head: Normocephalic.     Mouth/Throat:     Pharynx: No oropharyngeal exudate.  Eyes:     General: Lids are normal.     Conjunctiva/sclera: Conjunctivae normal.  Cardiovascular:     Rate and Rhythm: Normal rate and regular rhythm.     Heart sounds: Normal heart sounds, S1 normal and S2 normal.  Pulmonary:     Breath sounds: Normal breath sounds. No decreased breath sounds, wheezing, rhonchi or rales.  Abdominal:     Palpations: Abdomen is soft.     Tenderness: There is no abdominal tenderness.  Musculoskeletal:     Right lower leg: No swelling.     Left lower leg: No swelling.  Skin:    General: Skin is warm.     Findings: No rash.  Neurological:     Mental Status: He is alert and oriented to person, place, and time.      Scheduled Meds:  dexamethasone  4 mg Oral Daily   enoxaparin (LOVENOX) injection  40 mg Subcutaneous Q24H   feeding supplement  237 mL Oral TID PC & HS   ferrous sulfate  325 mg Oral Q breakfast   folic acid   1 mg Oral Daily   melatonin  5 mg Oral QHS   midodrine  5 mg Oral TID WC   mirtazapine  15 mg Oral QHS   polyethylene glycol  17 g Oral Daily   Vitamin D (Ergocalciferol)  50,000 Units Oral Q7 days     Assessment/Plan:  Metastatic prostate cancer with mets to bone with L5 compression fracture and L3-L4 spinal stenosis.  Seen by neurosurgery. Symptomatic anemia.  Transfuse 1 unit of packed red blood cells on 721.  Hemoglobin still 7.2.  We will recheck tomorrow and get a type and cross. Hypomagnesemia and hyponatremia.  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  Magnesium replaced during the hospital course Cachexia with severe protein calorie malnutrition on Remeron and Decadron Hypotension started on low-dose midodrine        Code Status:     Code Status Orders  (From admission, onward)           Start     Ordered   05/05/21 1039  Do not attempt resuscitation (DNR)  Continuous       Question Answer Comment  In the event of cardiac or respiratory ARREST Do not call a "code blue"   In the event  of cardiac or respiratory ARREST Do not perform Intubation, CPR, defibrillation or ACLS   In the event of cardiac or respiratory ARREST Use medication by any route, position, wound care, and other measures to relive pain and suffering. May use oxygen, suction and manual treatment of airway obstruction as needed for comfort.      05/05/21 1038           Code Status History     Date Active Date Inactive Code Status Order ID Comments User Context   03/30/2021 1659 05/05/2021 1038 Full Code [id number]  Criss Alvine, DO ED   04/01/2020 2322 04/06/2020 2322 Full Code [id number]  Toy Baker, MD ED      Disposition Plan: Status is: Inpatient  Dispo: The patient is from: Home              Anticipated d/c is to: Transitional care team trying to work on things              Patient currently stable to go out to facility once obtained.   Difficult to place patient.   Yes.  Consultants: Oncology  Time spent: 27 minutes  [place]

## 2021-05-21 NOTE — Progress Notes (Signed)
Mobility Specialist - Progress Note   05/21/21 1454  Mobility  Range of Motion/Exercises Right leg;Left leg (AP, SLR, HS, ABD, ISO, QS)  Level of Assistance Standby assist, set-up cues, supervision of patient - no hands on  Assistive Device None  Distance Ambulated (ft) 0 ft  Mobility Response Tolerated well  Mobility performed by Mobility specialist  $Mobility charge 1 Mobility    Pt lying in bed upon arrival, utilizing RA. Pt politely declined OOB activity this session, states he just finished ambulating prior to arrival---consistent with chart review. Pt agreeable to bed-level therex with encouragement: ankle pumps, straight leg raises, heel slides, hip isometrics, and quad set. VC for technique. Pt left in bed with needs in reach.    Kathee Delton Mobility Specialist 05/21/21, 2:57 PM

## 2021-05-22 LAB — TYPE AND SCREEN
ABO/RH(D): O POS
Antibody Screen: NEGATIVE

## 2021-05-22 LAB — CBC
HCT: 23.2 % — ABNORMAL LOW (ref 39.0–52.0)
Hemoglobin: 7.4 g/dL — ABNORMAL LOW (ref 13.0–17.0)
MCH: 27.8 pg (ref 26.0–34.0)
MCHC: 31.9 g/dL (ref 30.0–36.0)
MCV: 87.2 fL (ref 80.0–100.0)
Platelets: 239 10*3/uL (ref 150–400)
RBC: 2.66 MIL/uL — ABNORMAL LOW (ref 4.22–5.81)
RDW: 20.4 % — ABNORMAL HIGH (ref 11.5–15.5)
WBC: 10.5 10*3/uL (ref 4.0–10.5)
nRBC: 5.1 % — ABNORMAL HIGH (ref 0.0–0.2)

## 2021-05-22 MED ORDER — MIDODRINE HCL 5 MG PO TABS
10.0000 mg | ORAL_TABLET | Freq: Three times a day (TID) | ORAL | Status: DC
  Administered 2021-05-22 – 2021-06-02 (×32): 10 mg via ORAL
  Filled 2021-05-22 (×32): qty 2

## 2021-05-22 NOTE — Progress Notes (Signed)
Patient ID: Ryan [name], male   DOB: Mar 21, 1961, 60 y.o.   MRN: [id number] [facility] PROGRESS NOTE  Ryan [name] [id number] DOB: 1960/12/25 DOA: 03/30/2021 PCP: Pcp, No  HPI/Subjective: Patient feels okay.  Offers no complaints.  No pain.  Initially admitted 53 days ago with right hip pain  Objective: Vitals:   05/22/21 0741 05/22/21 1201  BP: (!) 95/57 (!) 90/56  Pulse: 79 86  Resp:  17  Temp:  98.5 F (36.9 C)  SpO2:  99%    Intake/Output Summary (Last 24 hours) at 05/22/2021 1515 Last data filed at 05/22/2021 1011 Gross per 24 hour  Intake 240 ml  Output 1300 ml  Net -1060 ml   Filed Weights   05/04/21 0846 05/11/21 1515 05/17/21 0600  Weight: 46.8 kg 43.3 kg 46.4 kg    ROS: Review of Systems  Respiratory:  Negative for shortness of breath.   Cardiovascular:  Negative for chest pain.  Gastrointestinal:  Negative for abdominal pain, nausea and vomiting.  Exam: Physical Exam HENT:     Head: Normocephalic.     Mouth/Throat:     Pharynx: No oropharyngeal exudate.  Eyes:     General: Lids are normal.     Conjunctiva/sclera: Conjunctivae normal.  Cardiovascular:     Rate and Rhythm: Normal rate and regular rhythm.     Heart sounds: Normal heart sounds, S1 normal and S2 normal.  Pulmonary:     Breath sounds: No decreased breath sounds, wheezing, rhonchi or rales.  Abdominal:     Palpations: Abdomen is soft.     Tenderness: There is no abdominal tenderness.  Musculoskeletal:     Right lower leg: No swelling.     Left lower leg: No swelling.  Skin:    General: Skin is warm.     Findings: No rash.  Neurological:     Mental Status: He is alert.     Comments: Answers all questions appropriately.      Scheduled Meds:  dexamethasone  4 mg Oral Daily   enoxaparin (LOVENOX) injection  40 mg Subcutaneous Q24H   feeding supplement  237 mL Oral TID PC & HS   ferrous sulfate  325 mg Oral Q breakfast   folic acid  1 mg Oral Daily   melatonin  5 mg Oral  QHS   midodrine  10 mg Oral TID WC   mirtazapine  15 mg Oral QHS   polyethylene glycol  17 g Oral Daily   Vitamin D (Ergocalciferol)  50,000 Units Oral Q7 days   Continuous Infusions:  Assessment/Plan:  Hypotension.  Blood pressure still on the lower side increase midodrine to 10 mg 3 times daily Metastatic prostate cancer with metastases to bone with L5 compression fracture and L3-L4 spinal stenosis.  Seen by neurosurgery.  Patient not complaining of any pain.  Patient on Decadron. Symptomatic anemia.  Patient received 1 unit of packed red blood cells on 04/29/2021.  Hemoglobin today 7.4. Hyponatremia.  Last sodium 131 Hypomagnesemia.  Magnesium replaced during the hospital course Cachexia with severe protein calorie malnutrition.  On Remeron.        Code Status:     Code Status Orders  (From admission, onward)           Start     Ordered   05/05/21 1039  Do not attempt resuscitation (DNR)  Continuous       Question Answer Comment  In the event of cardiac or respiratory ARREST Do not call a "code blue"  In the event of cardiac or respiratory ARREST Do not perform Intubation, CPR, defibrillation or ACLS   In the event of cardiac or respiratory ARREST Use medication by any route, position, wound care, and other measures to relive pain and suffering. May use oxygen, suction and manual treatment of airway obstruction as needed for comfort.      05/05/21 1038           Code Status History     Date Active Date Inactive Code Status Order ID Comments User Context   03/30/2021 1659 05/05/2021 1038 Full Code [id number]  Criss Alvine, DO ED   04/01/2020 2322 04/06/2020 2322 Full Code [id number]  Toy Baker, MD ED      Family Communication: Spoke with the patient's aunt on the phone.  She directed me to her son and I left a message for him. Disposition Plan: Status is: Inpatient  Dispo: The patient is from: Home              Anticipated d/c is to: Unclear at this point.   Transitional care team trying to work on things.  Patient's aunt will not take him in.              Patient currently medically stable to get out of the hospital once we find a place for him   Difficult to place patient.  Yes.  Time spent: 27 minutes  [place]

## 2021-05-23 DIAGNOSIS — D638 Anemia in other chronic diseases classified elsewhere: Secondary | ICD-10-CM

## 2021-05-23 NOTE — Progress Notes (Signed)
Patient ID: Ryan [name], male   DOB: [birth year], 60 y.o.   MRN: [id number] [facility] PROGRESS NOTE  Naren Neiderhiser [id number] DOB: [birth year] DOA: 03/30/2021 PCP: Pcp, No  HPI/Subjective: Patient feels okay.  He states his appetite is okay.  No pain.  Initially admitted on 6/21 for right hip pain.  Objective: Vitals:   05/23/21 0551 05/23/21 0839  BP: (!) 95/48 (!) 100/58  Pulse: 80 80  Resp: 18 16  Temp: 98.1 F (36.7 C) 98.1 F (36.7 C)  SpO2: 100% 100%    Intake/Output Summary (Last 24 hours) at 05/23/2021 1244 Last data filed at 05/23/2021 1011 Gross per 24 hour  Intake 246 ml  Output 801 ml  Net -555 ml   Filed Weights   05/04/21 0846 05/11/21 1515 05/17/21 0600  Weight: 46.8 kg 43.3 kg 46.4 kg    ROS: Review of Systems  Respiratory:  Negative for shortness of breath.   Cardiovascular:  Negative for chest pain.  Gastrointestinal:  Negative for abdominal pain, nausea and vomiting.  Exam: Physical Exam HENT:     Head: Normocephalic.     Mouth/Throat:     Pharynx: No oropharyngeal exudate.  Eyes:     General: Lids are normal.     Conjunctiva/sclera: Conjunctivae normal.     Pupils: Pupils are equal, round, and reactive to light.  Cardiovascular:     Rate and Rhythm: Normal rate and regular rhythm.     Heart sounds: Normal heart sounds, S1 normal and S2 normal.  Pulmonary:     Breath sounds: No decreased breath sounds, wheezing, rhonchi or rales.  Abdominal:     Palpations: Abdomen is soft.     Tenderness: There is no abdominal tenderness.  Musculoskeletal:     Right lower leg: No swelling.     Left lower leg: No swelling.  Skin:    General: Skin is warm.     Findings: No rash.  Neurological:     Mental Status: He is alert and oriented to person, place, and time.      Scheduled Meds:  dexamethasone  4 mg Oral Daily   enoxaparin (LOVENOX) injection  40 mg Subcutaneous Q24H   feeding supplement  237 mL Oral TID PC & HS   ferrous sulfate  325  mg Oral Q breakfast   folic acid  1 mg Oral Daily   melatonin  5 mg Oral QHS   midodrine  10 mg Oral TID WC   mirtazapine  15 mg Oral QHS   polyethylene glycol  17 g Oral Daily   Vitamin D (Ergocalciferol)  50,000 Units Oral Q7 days    Brief history.  60 year old male initially admitted with right hip pain.  Diagnosed here with metastatic prostate cancer with mets to bone with L5 compression fracture, symptomatic anemia.  Patient has cachexia and severe protein calorie malnutrition and hypotension.  The patient was evicted from his residence and does not have a place to stay at this point in time.  Transitional care team looking into options.  Assessment/Plan:  Metastatic prostate cancer with mets to bone with L5 compression fracture and L3-L4 spinal stenosis.  Seen by neurosurgery and conservative management.  Patient not having any pain at this time. Symptomatic anemia, anemia of chronic disease.  The patient was transfused 1 unit of packed red blood cells on 04/29/2021.  Last hemoglobin 7.4. Hypomagnesemia.  This was replaced during the hospital course Hyponatremia.  Last sodium 131 Severe protein calorie malnutrition and cachexia.  Continue  Remeron and Decadron Hypotension.  On midodrine to maintain blood pressure.     Code Status:     Code Status Orders  (From admission, onward)           Start     Ordered   05/05/21 1039  Do not attempt resuscitation (DNR)  Continuous       Question Answer Comment  In the event of cardiac or respiratory ARREST Do not call a "code blue"   In the event of cardiac or respiratory ARREST Do not perform Intubation, CPR, defibrillation or ACLS   In the event of cardiac or respiratory ARREST Use medication by any route, position, wound care, and other measures to relive pain and suffering. May use oxygen, suction and manual treatment of airway obstruction as needed for comfort.      05/05/21 1038           Code Status History     Date  Active Date Inactive Code Status Order ID Comments User Context   03/30/2021 1659 05/05/2021 1038 Full Code [id number]  Criss Alvine, DO ED   04/01/2020 2322 04/06/2020 2322 Full Code [id number]  Toy Baker, MD ED      Family Communication: Spoke with family yesterday Disposition Plan: Status is: Inpatient  Dispo: The patient is from: Home              Anticipated d/c is to: To be determined based on what transitional care team can arrange              Patient currently medically stable to go out to some sort of living arrangement once arranged.   Difficult to place patient.  Yes.  Time spent: 26 minutes  [place]

## 2021-05-24 LAB — PATHOLOGIST SMEAR REVIEW

## 2021-05-24 NOTE — Progress Notes (Signed)
[place] [facility] [place] NAME: Ryan [name]    MR#:  [id number]  DATE OF BIRTH:  Mar 10, 1961  SUBJECTIVE:   No new complaints REVIEW OF SYSTEMS:   Review of Systems  Constitutional:  Positive for malaise/fatigue and weight loss. Negative for chills and fever.  HENT:  Negative for ear discharge, ear pain and nosebleeds.   Eyes:  Negative for blurred vision, pain and discharge.  Respiratory:  Negative for sputum production, shortness of breath, wheezing and stridor.   Cardiovascular:  Negative for chest pain, palpitations, orthopnea and PND.  Gastrointestinal:  Negative for abdominal pain, diarrhea, nausea and vomiting.  Genitourinary:  Negative for frequency and urgency.  Musculoskeletal:  Negative for back pain and joint pain.  Neurological:  Positive for weakness. Negative for sensory change, speech change and focal weakness.  Psychiatric/Behavioral:  Negative for depression and hallucinations. The patient is not nervous/anxious.   Tolerating Diet:yes Tolerating PT: patient ambulated with physical therapy using walker without much assistance up to the nurses station.  DRUG ALLERGIES:  No Known Allergies  VITALS:  Blood pressure (!) 86/41, pulse 81, temperature 98.6 F (37 C), resp. rate 18, height '6\' 5"'$  (1.956 m), weight 63.1 kg, SpO2 99 %.  PHYSICAL EXAMINATION:   Physical Exam Constitutional: NAD, AAOx3 thin ,cachectic HEENT: conjunctivae and lids normal, EOMI CV: No cyanosis.   RESP: normal respiratory effort, on RA Extremities: No effusions, edema in BLE SKIN: warm, dry Neuro: II - XII grossly intact.   Psych: mood and affect normal.   LABORATORY PANEL:  CBC Recent Labs  Lab 05/22/21 0646  WBC 10.5  HGB 7.4*  HCT 23.2*  PLT 239     Chemistries  No results for input(s): NA, K, CL, CO2, GLUCOSE, BUN, CREATININE, CALCIUM, MG, AST, ALT, ALKPHOS, BILITOT in the last 168 hours.  Invalid input(s): GFRCGP Cardiac  Enzymes No results for input(s): TROPONINI in the last 168 hours. RADIOLOGY:  No results found. ASSESSMENT AND PLAN:  Ryan [name] is a 60 y.o. male with medical history significant for history of polysubstance abuse including THC, cocaine, alcohol, remote history of tobacco use, history of right proximal femur inter trochanteric fracture status post right IM nail intertrochanteric fixation in 04/02/2021, does not follow-up with a PCP, presents to the emergency department for chief concerns of right hip pain.  Pain is chronic for several months, denies any trauma, difficulty ambulation due to pain. He also endorses an unintentional 35 pound weight loss in the last year, denies bright red blood per rectum and melena. patient was found to have L5 compression fracture, L3/4 severe stenosis.  He was also found to have widespread bony metastasis  from prostate cancer.  Metastatic prostate cancer --with mets to bone with associated L5 compression fracture, L3-L4 severe spinal stenosis with right lower extremity weakness. --Neurosurgery consulted recommended not a surgical candidate --pt has unrealistic expectation that he will be cured from cancer. --Oncology following for inpatient cancer related needs. Follow-up with [name] as outpatient   Severe symptomatic anemia with folic acid deficiency 123456 Hb 7.0, transfused 1 unit of PRBC --Transfuse to keep hemoglobin greater than 7. --cont folic acid supplement   Hypomagnesemia --replete as needed.   Hyponatremia --Likely multifactorial secondary to possible SIADH versus hypovolemia --Appears chronic and asymptomatic --Encourage PO intake    non anion gap metabolic acidosis [id number] started po bicarb 650 TID x 4 days   Cancer cachexia with severe protein calorie malnutrition Dietary on board  recommend encouraging oral intake. --appeared to be eating more now. --Continue Decadron and Remeron   Left heel pain, resolved X rays of the foot neg  for pathological fractures.   Hypotension: Asymptomatic. Started on midodrine. --keep MAP greater than 65 --cont midodrine down to 5 mg TID     DVT prophylaxis: SQ Lovenox, pt refuses Code Status: Full Family Communication: none disposition Plan: Status is: Inpatient     Dispo: The patient is from: Home              Anticipated d/c is to: home w [facility]              Patient currently is medically stable to d/c.              Difficult to place patient Yes   8/10--patient ambulated very well with physical therapy using rolling walker today. It was witnessed by me and RN. Overall improving. Patient is agreeable to go home. TOC for discharge planning-- charity PT, equipment.  8/11-- pt's aunt cecilia has refused to take pt home with her. No other close family.   8/15--NO d/c plan        TOTAL TIME TAKING CARE OF THIS PATIENT: 20 minutes.  >50% time spent on counselling and coordination of care  Note: This dictation was prepared with Dragon dictation along with smaller phrase technology. Any transcriptional errors that result from this process are unintentional.  Fritzi Mandes M.D    [facility]   CC: Primary care physician; Pcp, No Patient ID: Ryan [name], male   DOB: 1961/06/08, 60 y.o.   MRN: [id number]

## 2021-05-24 NOTE — Evaluation (Signed)
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   Clinical Impression   Pt seen for re-assessment this session. Pt is very pleasant and cooperative this session and agreeable to OT intervention. Pt seated on BSC having BM when OT enters the room. Pt washing LB while seated on BSC with set up A. Pt standing for hygiene and to wash buttocks and peri areas with min guard - min A for standing balance. Pt returning to seated position and needs assist to thread pants onto B feet and stands to pull over B hips with min guard for balance while standing with RW. Pt needing multiple rest breaks and OT discussed energy conservation eduction for discharge. Pt is remaining positive and reports wanting to remain focused on getting stronger. Pt's goals upgraded to mod I. Pt continues to benefit from OT intervention.      Follow Up Recommendations  SNF    Equipment Recommendations  None recommended by OT       Precautions / Restrictions Precautions Precautions: Fall Required Braces or Orthoses: Other Brace Spinal Brace: Thoracolumbosacral orthotic Spinal Brace Comments: PRN for comfort Other Brace: LLE post op shoe, PRN for pain Restrictions Weight Bearing Restrictions: No Other Position/Activity Restrictions: LLE post op shoe, PRN pain      Mobility       Transfers Overall transfer level: Needs assistance Equipment used: Rolling walker (2 wheeled) Transfers: Sit to/from [facility] Sit to Stand: Min  guard Stand pivot transfers: Min guard            Balance Overall balance assessment: Needs assistance Sitting-balance support: No upper extremity supported;Feet supported Sitting balance-Leahy Scale: Good     Standing balance support: Bilateral upper extremity supported;During functional activity Standing balance-Leahy Scale: Fair                             ADL either performed or assessed with clinical judgement   ADL Overall ADL's : Needs assistance/impaired                         Toilet Transfer: BSC;RW;Minimal assistance   Toileting- Clothing Manipulation and Hygiene: Minimal assistance;Sit to/from stand Toileting - Clothing Manipulation Details (indicate cue type and reason): min A to thread clothing onto B feet and min guard for balance when standing to pull over B hips             Vision Baseline Vision/History: No visual deficits Patient Visual Report: No change from baseline              Pertinent Vitals/Pain Pain Assessment: No/denies pain              Cognition Arousal/Alertness: Awake/alert Behavior During Therapy: WFL for tasks assessed/performed Overall Cognitive Status: Within Functional Limits for tasks assessed                                       OT Goals(Current  goals can be found in the care plan section) Acute Rehab OT Goals Patient Stated Goal: to get stronger OT Goal Formulation: With patient Time For Goal Achievement: 06/07/21 ADL Goals Pt Will Perform Grooming: with modified independence;standing Pt Will Transfer to Toilet: with modified independence;ambulating  OT Frequency: Min 1X/week    AM-PAC OT "6 Clicks" Daily Activity     Outcome Measure Help from another person eating meals?: A Little Help from another person taking care of personal grooming?: A Little Help from another person toileting, which includes using toliet, bedpan, or urinal?: A Little Help from another person  bathing (including washing, rinsing, drying)?: A Little Help from another person to put on and taking off regular upper body clothing?: A Little Help from another person to put on and taking off regular lower body clothing?: A Little 6 Click Score: 18   End of Session Equipment Utilized During Treatment: Rolling walker;Other (comment) [facility]) Nurse Communication: Mobility status  Activity Tolerance: Patient tolerated treatment well Patient left: in bed;with call bell/phone within reach;with bed alarm set  OT Visit Diagnosis: Unsteadiness on feet (R26.81);Muscle weakness (generalized) (M62.81) Pain - Right/Left: Right                Time: [id number] OT Time Calculation (min): 23 min Charges:  OT General Charges $OT Visit: 1 Visit OT Evaluation $OT Re-eval: 1 Re-eval OT Treatments $Self Care/Home Management : 23-37 mins  Darleen Crocker, MS, OTR/L , CBIS ascom [phone]  05/24/21, 4:13 PM

## 2021-05-24 NOTE — Progress Notes (Signed)
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    PT Comments    Pt agrees to session on second attempt.  Pt able to don clothes in supine without assist.  Bed mobility with supervision.  Once sitting he stated he needed to have a BM.  Transferred to commode at bedside with supervision.  OT steps in to assist with bathing.  I provided full linen change as sheets were soiled with BM.  After care with OT, he is able to walk 100' on unit despite being generally fatigued from tasks.  Remained in room with OT after session.   Follow Up Recommendations  Home health PT     Equipment Recommendations  Rolling walker with 5" wheels;3in1 (PT)    Recommendations for Other Services       Precautions / Restrictions Precautions Precautions: Fall Required Braces or Orthoses: Other Brace Spinal Brace: Thoracolumbosacral orthotic Spinal Brace Comments: PRN for comfort Other Brace: LLE post op shoe, PRN for pain Restrictions Weight Bearing Restrictions: No Other Position/Activity Restrictions: LLE post op shoe, PRN pain    Mobility  Bed Mobility                    Transfers Overall transfer level: Modified independent Equipment used: Rolling walker (2 wheeled) Transfers: Sit to/from [facility] Sit to Stand: Modified independent (Device/Increase time) Stand pivot transfers: Min guard          Ambulation/Gait Ambulation/Gait assistance: Supervision Gait Distance (Feet): 100 Feet Assistive device:  Rolling walker (2 wheeled) Gait Pattern/deviations: Narrow base of support;Trunk flexed Gait velocity: decreased       Stairs             Wheelchair Mobility    Modified Rankin (Stroke Patients Only)       Balance Overall balance assessment: Needs assistance Sitting-balance support: No upper extremity supported;Feet supported Sitting balance-Leahy Scale: Good     Standing balance support: Bilateral upper extremity supported Standing balance-Leahy Scale: Fair                              Cognition Arousal/Alertness: Awake/alert Behavior During Therapy: WFL for tasks assessed/performed Overall Cognitive Status: Within Functional Limits for tasks assessed                                        Exercises      General Comments        Pertinent Vitals/Pain Pain Assessment: No/denies pain    Home Living                      Prior Function            PT Goals (current goals can now be found in the care plan section) Progress towards PT goals: Progressing toward goals    Frequency    Min 2X/week      PT Plan Current plan remains appropriate  Co-evaluation              AM-PAC PT "6 Clicks" Mobility   Outcome Measure  Help needed turning from your back to your side while in a flat bed without using bedrails?: None Help needed moving from lying on your back to sitting on the side of a flat bed without using bedrails?: None Help needed moving to and from a bed to a chair (including a wheelchair)?: None Help needed standing up from a chair using your arms (e.g., wheelchair or bedside chair)?: None Help needed to walk in hospital room?: A Little Help needed climbing 3-5 steps with a railing? : A Little 6 Click Score: 22    End of Session Equipment Utilized During Treatment: Gait belt Activity Tolerance: Patient tolerated treatment well Patient left: in bed;with call bell/phone within reach;with bed alarm  set Nurse Communication: Mobility status PT Visit Diagnosis: Unsteadiness on feet (R26.81);Muscle weakness (generalized) (M62.81);Difficulty in walking, not elsewhere classified (R26.2)     Time: [id number] PT Time Calculation (min) (ACUTE ONLY): 17 min  Charges:  $Gait Training: 8-22 mins                    [name], PTA 05/24/21, 3:03 PM , 3:01 PM

## 2021-05-25 NOTE — TOC Progression Note (Signed)
Transition of Care [facility]) - Progression Note    Patient Details  Name: Ryan [name] MRN: [id number] Date of Birth: June 26, 1961  Transition of [facility]) CM/SW [place], RN Phone Number: 05/25/2021, 9:38 AM  Clinical Narrative:    Spoke with Amy Hermelinda Medicus reports Medicaid application still pending.    Expected Discharge Plan: [place] Barriers to Discharge: Inadequate or no insurance  Expected Discharge Plan and Services Expected Discharge Plan: [place]   Discharge Planning Services: CM Consult Post Acute Care Choice: [place] arrangements for the past 2 months: Apartment (lived in [place] before hospital)                 DME Arranged: N/A DME Agency: NA       HH Arranged: PT [place] Agency: [place] Date [place]: 05/19/21 Time [place]: [id number] Representative spoke with at [place]: [place] ([place]) Interventions    Readmission Risk Interventions Readmission Risk Prevention Plan 04/23/2021  Transportation Screening Complete  PCP or Specialist Appt within 3-5 Days Complete  HRI or [place] Complete  Social Work Consult for [place] Planning/Counseling Not Complete  SW consult not completed comments RNCM assigned to patient  Palliative Care Screening Complete  Medication Review Press photographer) Complete  Some recent data might be hidden

## 2021-05-25 NOTE — Progress Notes (Signed)
[place] at [place] NAME: Ryan [name]    MR#:  [id number]  DATE OF BIRTH:  08-Dec-1960  SUBJECTIVE:   No new complaints Ambulates with walker by self REVIEW OF SYSTEMS:   Review of Systems  Constitutional:  Positive for malaise/fatigue and weight loss. Negative for chills and fever.  HENT:  Negative for ear discharge, ear pain and nosebleeds.   Eyes:  Negative for blurred vision, pain and discharge.  Respiratory:  Negative for sputum production, shortness of breath, wheezing and stridor.   Cardiovascular:  Negative for chest pain, palpitations, orthopnea and PND.  Gastrointestinal:  Negative for abdominal pain, diarrhea, nausea and vomiting.  Genitourinary:  Negative for frequency and urgency.  Musculoskeletal:  Negative for back pain and joint pain.  Neurological:  Positive for weakness. Negative for sensory change, speech change and focal weakness.  Psychiatric/Behavioral:  Negative for depression and hallucinations. The patient is not nervous/anxious.   Tolerating Diet:yes Tolerating PT: patient ambulates using walker  DRUG ALLERGIES:  No Known Allergies  VITALS:  Blood pressure 106/61, pulse 90, temperature 97.9 F (36.6 C), resp. rate 18, height '6\' 5"'$  (1.956 m), weight 50.6 kg, SpO2 100 %.  PHYSICAL EXAMINATION:   Physical Exam Constitutional: NAD, AAOx3 thin ,cachectic HEENT: conjunctivae and lids normal, EOMI CV: No cyanosis.   RESP: normal respiratory effort, on RA Extremities: No effusions, edema in BLE SKIN: warm, dry Neuro: II - XII grossly intact.   Psych: mood and affect normal.   LABORATORY PANEL:  CBC Recent Labs  Lab 05/22/21 0646  WBC 10.5  HGB 7.4*  HCT 23.2*  PLT 239     Chemistries  No results for input(s): NA, K, CL, CO2, GLUCOSE, BUN, CREATININE, CALCIUM, MG, AST, ALT, ALKPHOS, BILITOT in the last 168 hours.  Invalid input(s): GFRCGP Cardiac Enzymes No results for input(s): TROPONINI in the  last 168 hours. RADIOLOGY:  No results found. ASSESSMENT AND PLAN:  Ryan [name] is a 60 y.o. male with medical history significant for history of polysubstance abuse including THC, cocaine, alcohol, remote history of tobacco use, history of right proximal femur inter trochanteric fracture status post right IM nail intertrochanteric fixation in 04/02/2021, does not follow-up with a PCP, presents to the emergency department for chief concerns of right hip pain.  Pain is chronic for several months, denies any trauma, difficulty ambulation due to pain. He also endorses an unintentional 35 pound weight loss in the last year, denies bright red blood per rectum and melena. patient was found to have L5 compression fracture, L3/4 severe stenosis.  He was also found to have widespread bony metastasis  from prostate cancer.  Metastatic prostate cancer --with mets to bone with associated L5 compression fracture, L3-L4 severe spinal stenosis with right lower extremity weakness. --Neurosurgery consulted recommended not a surgical candidate --pt has unrealistic expectation that he will be cured from cancer. --Oncology following for inpatient cancer related needs. Follow-up with [name] as outpatient   Severe symptomatic anemia with folic acid deficiency [id number] Hb 7.0, transfused 1 unit of PRBC --Transfuse to keep hemoglobin greater than 7. --cont folic acid supplement   Hypomagnesemia --replete as needed.   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 --appeared to be  eating more now. --Continue Decadron and Remeron   Left heel pain, resolved X rays of the foot neg for pathological fractures.    Hypotension: Asymptomatic. Started on midodrine. --keep MAP greater than 65 --cont midodrine down to 5 mg TID     DVT prophylaxis: SQ Lovenox, pt refuses Code Status: Full Family Communication: none disposition Plan: Status is: Inpatient     Dispo: The patient is from: Home              Anticipated d/c is to: home w [facility]              Patient currently is medically stable to d/c.              Difficult to place patient Yes   8/10--patient ambulated very well with physical therapy using rolling walker today. It was witnessed by me and RN. Overall improving. Patient is agreeable to go home. TOC for discharge planning-- charity PT, equipment.  8/11-- pt's aunt cecilia has refused to take pt home with her. No other close family.   8/15--NO d/c plan   8/16-- PEr TOC medicaid application pending       TOTAL TIME TAKING CARE OF THIS PATIENT: 20 minutes.  >50% time spent on counselling and coordination of care  Note: This dictation was prepared with Dragon dictation along with smaller phrase technology. Any transcriptional errors that result from this process are unintentional.  Fritzi Mandes M.D    [facility]   CC: Primary care physician; Pcp, No Patient ID: Danilo Mcgregor, male   DOB: 18-Dec-1960, 60 y.o.   MRN: [id number]

## 2021-05-25 NOTE — Progress Notes (Signed)
Nutrition Follow-up  DOCUMENTATION CODES:  Severe malnutrition in context of chronic illness, Underweight  INTERVENTION:  Encourage PO intake, continue current diet as ordered Continue double protein portions with meals. Continue Ensure Enlive QID, each supplement provides 350 kcal and 20 grams of protein . Continue snacks TID New measured weight to be taken weekly  Pt would benefit from feeding tube placement and nocturnal feeds if agreeable.  NUTRITION DIAGNOSIS:  Severe Malnutrition (in the context of chronic illness) related to poor appetite as evidenced by severe muscle depletion, severe fat depletion.  - ongoing  GOAL:  Patient will meet greater than or equal to 90% of their needs  - not meeting  MONITOR:  PO intake, Weight trends  REASON FOR ASSESSMENT:  Consult Assessment of nutrition requirement/status  ASSESSMENT:  60 y.o. male presented to ED for intermittent right lateral hip pain that is been ongoing for the past several weeks. Pt had surgery 10 months ago but reports he has not followed-up for pain. Imaging in ED concerning for marrow infiltrative process. PMH relevant for polysubstance abuse.  Found to have prostate cancer with widespread bony metastasis. Pt a difficult placement so cancer treatment was initiated inpatient. Receiving Zometea and Firmagon. First treatement was 04/02/21.   Some weight gain noted since last assessment and pt continues to have better participation in therapy. Fair intake since last assessment but pt continues to consume ensure. CM continues to work on [employer].   Average Meal Intake: 6/26-6/28: ~32% x 4 recorded meals (0-100%) 6/29-7/6: 48% intake x 6 recorded meals (15-90%) 7/7-7/12: 50% intake x 5 recorded meals (0-100%) 7/13-7/19: 30% intake x 2 recorded meals (30%) 7/20-7/26: 63% intake x 4 recorded meals (50-100%) 7/27-8/2: No meals recorded 8/3-8/9: 70% intake x 2 recorded meals 8/10-8/15: 50% intake x 2 recorded  meals  Nutritionally Relevant Medications: Scheduled Meds:  dexamethasone  4 mg Oral Daily   feeding supplement  237 mL Oral TID PC & HS   ferrous sulfate  325 mg Oral Q breakfast   folic acid  1 mg Oral Daily   midodrine  10 mg Oral TID WC   mirtazapine  15 mg Oral QHS   polyethylene glycol  17 g Oral Daily   Vitamin D (Ergocalciferol)  50,000 Units Oral Q7 days   PRN Meds: bisacodyl, ondansetron  Labs Reviewed  Diet Order:   Diet Order             Diet regular Room service appropriate? Yes; Fluid consistency: Thin; Fluid restriction: Other (see comments)  Diet effective now                  EDUCATION NEEDS:  Education needs have been addressed  Skin:  Skin Assessment: Reviewed RN Assessment  Last BM:  8/13 - type 2  Height:  Ht Readings from Last 1 Encounters:  03/30/21 '6\' 5"'$  (1.956 m)   Weight:  Wt Readings from Last 1 Encounters:  05/25/21 50.6 kg   Ideal Body Weight:  94.5 kg  BMI:  Body mass index is 13.23 kg/m.  Estimated Nutritional Needs:  Kcal:  2400-2600 kcal/d Protein:  130-150 g/d Fluid:  >2.5L/d  Ranell Patrick, RD, LDN Clinical Dietitian Pager on [place]

## 2021-05-26 NOTE — TOC Progression Note (Signed)
Transition of Care [facility]) - Progression Note    Patient Details  Name: Ryan [name] MRN: [id number] Date of Birth: June 16, 1961  Transition of Care [facility]) CM/SW Contact  Shelbie Hutching, RN Phone Number: 05/26/2021, 8:46 AM  Clinical Narrative:    Bed request sent over to [facility].  Patient has Medicaid Pending.     Expected Discharge Plan: [place] Barriers to Discharge: Inadequate or no insurance  Expected Discharge Plan and Services Expected Discharge Plan: [place]   Discharge Planning Services: CM Consult Post Acute Care Choice: [place] arrangements for the past 2 months: Apartment (lived in [place] before hospital)                 DME Arranged: N/A DME Agency: NA       HH Arranged: PT [place] Agency: [place] Date [place]: 05/19/21 Time [place]: [id number] Representative spoke with at [place]: [place] ([place]) Interventions    Readmission Risk Interventions Readmission Risk Prevention Plan 04/23/2021  Transportation Screening Complete  PCP or Specialist Appt within 3-5 Days Complete  HRI or [place] Complete  Social Work Consult for [place] Planning/Counseling Not Complete  SW consult not completed comments RNCM assigned to patient  Palliative Care Screening Complete  Medication Review Press photographer) Complete  Some recent data might be hidden

## 2021-05-26 NOTE — Progress Notes (Signed)
[place] [facility] [place] NAME: Ryan [name]    MR#:  [id number]  DATE OF BIRTH:  10/12/1960  SUBJECTIVE:   No new complaints Ambulates with mobility therapist REVIEW OF SYSTEMS:   Review of Systems  Constitutional:  Positive for malaise/fatigue and weight loss. Negative for chills and fever.  HENT:  Negative for ear discharge, ear pain and nosebleeds.   Eyes:  Negative for blurred vision, pain and discharge.  Respiratory:  Negative for sputum production, shortness of breath, wheezing and stridor.   Cardiovascular:  Negative for chest pain, palpitations, orthopnea and PND.  Gastrointestinal:  Negative for abdominal pain, diarrhea, nausea and vomiting.  Genitourinary:  Negative for frequency and urgency.  Musculoskeletal:  Negative for back pain and joint pain.  Neurological:  Positive for weakness. Negative for sensory change, speech change and focal weakness.  Psychiatric/Behavioral:  Negative for depression and hallucinations. The patient is not nervous/anxious.   Tolerating Diet:yes Tolerating PT: patient ambulates using walker  DRUG ALLERGIES:  No Known Allergies  VITALS:  Blood pressure 97/66, pulse 81, temperature 98.3 F (36.8 C), temperature source Oral, resp. rate 19, height '6\' 5"'$  (1.956 m), weight 50.6 kg, SpO2 100 %.  PHYSICAL EXAMINATION:   Physical Exam Constitutional: NAD, AAOx3 thin ,cachectic HEENT: conjunctivae and lids normal, EOMI CV: No cyanosis.   RESP: normal respiratory effort, on RA Extremities: No effusions, edema in BLE SKIN: warm, dry Neuro: II - XII grossly intact.   Psych: mood and affect normal.   LABORATORY PANEL:  CBC Recent Labs  Lab 05/22/21 0646  WBC 10.5  HGB 7.4*  HCT 23.2*  PLT 239     Chemistries  No results for input(s): NA, K, CL, CO2, GLUCOSE, BUN, CREATININE, CALCIUM, MG, AST, ALT, ALKPHOS, BILITOT in the last 168 hours.  Invalid input(s): GFRCGP Cardiac Enzymes No results for  input(s): TROPONINI in the last 168 hours. RADIOLOGY:  No results found. ASSESSMENT AND PLAN:  Ryan [name] is a 60 y.o. male with medical history significant for history of polysubstance abuse including THC, cocaine, alcohol, remote history of tobacco use, history of right proximal femur inter trochanteric fracture status post right IM nail intertrochanteric fixation in 04/02/2021, does not follow-up with a PCP, presents to the emergency department for chief concerns of right hip pain.  Pain is chronic for several months, denies any trauma, difficulty ambulation due to pain. He also endorses an unintentional 35 pound weight loss in the last year, denies bright red blood per rectum and melena. patient was found to have L5 compression fracture, L3/4 severe stenosis.  He was also found to have widespread bony metastasis  from prostate cancer.  Metastatic prostate cancer --with mets to bone with associated L5 compression fracture, L3-L4 severe spinal stenosis with right lower extremity weakness. --Neurosurgery consulted recommended not a surgical candidate --pt has unrealistic expectation that he will be cured from cancer. --Oncology following for inpatient cancer related needs. Follow-up with [name] as outpatient   Severe symptomatic anemia with folic acid deficiency 123456 Hb 7.0, transfused 1 unit of PRBC --Transfuse to keep hemoglobin greater than 7. --cont folic acid supplement   Hypomagnesemia --replete as needed.   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 --appeared  to be eating more now. --Continue Decadron and Remeron   Left heel pain, resolved X rays of the foot neg for pathological  fractures.   Hypotension: Asymptomatic. Started on midodrine. --keep MAP greater than 65 --cont midodrine down to 5 mg TID     DVT prophylaxis: SQ Lovenox, pt refuses Code Status: Full Family Communication: none disposition Plan: Status is: Inpatient     Dispo: The patient is from: Home              Anticipated d/c is to: home w [facility]              Patient currently is medically stable to d/c.              Difficult to place patient Yes   8/10--patient ambulated very well with physical therapy using rolling walker today. It was witnessed by me and RN. Overall improving. Patient is agreeable to go home. TOC for discharge planning-- charity PT, equipment.  8/11-- pt's aunt cecilia has refused to take pt home with her. No other close family.   8/15--NO d/c plan   8/16 and 8/17-- PEr TOC medicaid application pending       TOTAL TIME TAKING CARE OF THIS PATIENT: 20 minutes.  >50% time spent on counselling and coordination of care  Note: This dictation was prepared with Dragon dictation along with smaller phrase technology. Any transcriptional errors that result from this process are unintentional.  Fritzi Mandes M.D    [facility]   CC: Primary care physician; Pcp, No Patient ID: Ryan [name], male   DOB: [birth year], 60 y.o.   MRN: [id number]

## 2021-05-26 NOTE — Progress Notes (Signed)
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   OT comments  Upon entering the room, pt supine in bed with c/o fatigue. Pt reports working with therapy earlier today and not understanding why I was back. OT explained OT purpose and POC to pt . He did ambulate with mobility tech earlier. Pt declined all OOB activity. OT provided pt with yellow theraband for B UE strengthening exercises. Pt performs 2 set of 10 chest pulls, shoulder diagonals, bicep curls, and alternating punches with min cuing for technique. Pt also stretching UEs with reports of L bicep feeling sore. Pt remained in bed with no other concerns. All needs within reach.    Follow Up Recommendations  Home health OT    Equipment Recommendations  3 in 1 bedside commode       Precautions / Restrictions Precautions Precautions: Fall Required Braces or Orthoses: Other Brace Spinal Brace: Thoracolumbosacral orthotic Spinal Brace Comments: PRN for comfort Other Brace: LLE post op shoe, PRN for pain Restrictions Other Position/Activity Restrictions: LLE post op shoe, PRN pain              ADL either performed or assessed with clinical judgement     Vision Baseline Vision/History: No visual deficits Patient Visual Report: No change from baseline            Cognition Arousal/Alertness: Awake/alert Behavior During Therapy: WFL for tasks assessed/performed Overall Cognitive Status: Within Functional Limits for tasks assessed                                  General Comments: alert and oriented. Encourgament to participate.                   Pertinent Vitals/ Pain       Pain Assessment: Faces Faces Pain Scale: Hurts little more Pain Location: L bicep Pain Descriptors / Indicators: Discomfort Pain Intervention(s): Monitored during session;Repositioned  Home Living Family/patient expects to be discharged to:: Private residence                                            Frequency  Min 1X/week        Progress Toward Goals  OT Goals(current goals can now be found in the care plan section)  Progress towards OT goals: Progressing toward goals  Acute Rehab OT Goals Patient Stated Goal: to get stronger OT Goal Formulation: With patient Time For Goal Achievement: 06/07/21  Plan Frequency remains appropriate;Discharge plan needs to be updated       AM-PAC OT "6 Clicks" Daily Activity     Outcome Measure   Help from another person eating meals?: None Help from another person taking care of personal grooming?: None Help from another person toileting, which includes using toliet, bedpan, or urinal?: A Little Help from another person bathing (including washing, rinsing, drying)?: A Little Help from another person to put on and  taking off regular upper body clothing?: A Little Help from another person to put on and taking off regular lower body clothing?: A Little 6 Click Score: 20    End of Session    OT Visit Diagnosis: Unsteadiness on feet (R26.81);Muscle weakness (generalized) (M62.81)   Activity Tolerance Patient limited by fatigue   Patient Left in bed;with call bell/phone within reach;with bed alarm set   Nurse Communication Mobility status        Time: [id number] OT Time Calculation (min): 23 min  Charges: OT General Charges $OT Visit: 1 Visit OT Treatments $Therapeutic Exercise: 23-37 mins  Darleen Crocker, MS, OTR/L , CBIS ascom [phone]  05/26/21, 3:04 PM

## 2021-05-26 NOTE — Progress Notes (Signed)
Mobility Specialist - Progress Note   05/26/21 1200  Mobility  Activity Ambulated in hall  Level of Assistance Modified independent, requires aide device or extra time  Assistive Device Front wheel walker  Distance Ambulated (ft) 150 ft  Mobility Ambulated with assistance in hallway  Mobility Response Tolerated well  Mobility performed by Mobility specialist  $Mobility charge 1 Mobility    Pt lying in bed upon arrival, utilizing RA. Pt has BM while supine in bed, when asked why he did not use BSC next to him he replies "I'm tired---my feet hurt". Pt tries to perform peri-care while in bed, Mod encouragement to get pt to stand at bedside for proper cleaning. Clean linen provided. Supervision to don socks and mesh undergarments while seated. Pt attempts to get mobility tech to do work for him, reminded pt that he has to perform these tasks on his own if he wants to get stronger. Max encouragement to get pt to continue ambulation into hall with goal distance in mind. Tolerated fair. Pt left in bed with needs in reach.    Kathee Delton Mobility Specialist 05/26/21, 12:43 PM

## 2021-05-26 NOTE — Plan of Care (Signed)
  Problem: Education: Goal: Knowledge of General Education information will improve Description: Including pain rating scale, medication(s)/side effects and non-pharmacologic comfort measures Outcome: Progressing   Problem: Clinical Measurements: Goal: Cardiovascular complication will be avoided Outcome: Progressing   Problem: Activity: Goal: Risk for activity intolerance will decrease Outcome: Progressing   Problem: Pain Managment: Goal: General experience of comfort will improve Outcome: Progressing   Problem: Safety: Goal: Ability to remain free from injury will improve Outcome: Progressing   

## 2021-05-27 NOTE — TOC Progression Note (Signed)
Transition of Care [facility]) - Progression Note    Patient Details  Name: Ryan [name] MRN: [id number] Date of Birth: 12-Sep-1961  Transition of Care [facility] CM/SW Contact  Shelbie Hutching, RN Phone Number: 05/27/2021, 3:37 PM  Clinical Narrative:    RNCM reached out to the [facility] in [place] to see if they can help with expediting disability and Medicaid.     Expected Discharge Plan: [place] Barriers to Discharge: Inadequate or no insurance  Expected Discharge Plan and Services Expected Discharge Plan: [place]   Discharge Planning Services: CM Consult Post Acute Care Choice: [place] arrangements for the past 2 months: Apartment (lived in [place] before hospital)                 DME Arranged: N/A DME Agency: NA       HH Arranged: PT [place] Agency: [place] Date [place]: 05/19/21 Time [place]: [id number] Representative spoke with at [place]: [place] ([place]) Interventions    Readmission Risk Interventions Readmission Risk Prevention Plan 04/23/2021  Transportation Screening Complete  PCP or Specialist Appt within 3-5 Days Complete  HRI or [place] Complete  Social Work Consult for [place] Planning/Counseling Not Complete  SW consult not completed comments RNCM assigned to patient  Palliative Care Screening Complete  Medication Review Press photographer) Complete  Some recent data might be hidden

## 2021-05-27 NOTE — Progress Notes (Signed)
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    PT Comments    Pt was long sitting in bed upon arriving. He agrees to session with minimal encouragement. Pt was easily able to exit L side of bed, stand to RW and ambulate with RW without LOB or safety concern. He is most limited by pain in bilateral feet and fatigue. Pt will benefit from continues skilled PT going forward to continue to progress strength, balance and safe functional mobility. Continued recommendation for HHPT at DC to continue to progress him to PLOF.    Follow Up Recommendations  Home health PT;Supervision - Intermittent     Equipment Recommendations  Rolling walker with 5" wheels;3in1 (PT)       Precautions / Restrictions Precautions Precautions: Fall Restrictions Weight Bearing Restrictions: No    Mobility  Bed Mobility Overal bed mobility: Modified Independent    Transfers Overall transfer level: Needs assistance Equipment used: Rolling walker (2 wheeled) Transfers: Sit to/from Stand Sit to Stand: Min guard         General transfer comment: CGA for safety + vcs for improved technique and safety. was able to pull pants/ underwear up independently  Ambulation/Gait Ambulation/Gait assistance: Supervision Gait Distance (Feet): 150 Feet Assistive device: Rolling walker (2 wheeled) Gait Pattern/deviations: Narrow base of support;Trunk flexed Gait velocity: decreased   General Gait Details: Pt demonstrated safe ability to ambulate 150 ft  with RW. no LOB or safety concern    Balance Overall balance assessment: Needs assistance Sitting-balance support: No upper extremity supported;Feet supported Sitting balance-Leahy Scale: Good     Standing balance support: Bilateral upper extremity supported;During functional activity Standing balance-Leahy Scale: Fair Standing balance comment: no LOB with UE support. Discussed need for use of RW at DC      Cognition Arousal/Alertness: Awake/alert Behavior During Therapy: WFL for tasks assessed/performed Overall Cognitive Status: Within Functional Limits for tasks assessed             Pertinent Vitals/Pain Pain Assessment: No/denies pain Pain Score: 0-No pain Faces Pain Scale: No hurt Pain Descriptors / Indicators: Discomfort (Bilateral feet during ambulation) Pain Intervention(s): Limited activity within patient's tolerance;Monitored during session;Premedicated before session;Repositioned     PT Goals (current goals can now be found in the care plan section) Acute Rehab PT Goals Patient Stated Goal: to get stronger Progress towards PT goals: Progressing toward goals    Frequency    Min 2X/week      PT Plan Current plan remains appropriate    Co-evaluation     PT goals addressed during session: Mobility/safety with mobility;Balance;Proper use of DME;Strengthening/ROM        AM-PAC PT "6 Clicks" Mobility   Outcome Measure  Help needed turning from your back to your side while in a flat bed without using bedrails?: None Help needed moving from lying on your back to sitting on the side of a flat bed without using bedrails?: None Help needed moving to and from a bed to a chair (including a wheelchair)?: None Help needed standing up from a chair using your arms (e.g., wheelchair  or bedside chair)?: None Help needed to walk in hospital room?: A Little Help needed climbing 3-5 steps with a railing? : A Little 6 Click Score: 22    End of Session Equipment  Utilized During Treatment: Gait belt Activity Tolerance: Patient tolerated treatment well Patient left: in bed;with call bell/phone within reach;with bed alarm set Nurse Communication: Mobility status PT Visit Diagnosis: Unsteadiness on feet (R26.81);Muscle weakness (generalized) (M62.81);Difficulty in walking, not elsewhere classified (R26.2)     Time: 1100-1120 PT Time Calculation (min) (ACUTE ONLY): 20 min  Charges:  $Gait Training: 8-22 mins                    Julaine Fusi PTA 05/27/21, 4:39 PM

## 2021-05-27 NOTE — Progress Notes (Signed)
PROGRESS NOTE    Ryan [name]  [id number] DOB: [birth year] DOA: 03/30/2021 PCP: Pcp, No  114A/114A-AA   Assessment & Plan:   Principal Problem:   Symptomatic anemia Active Problems:   Hyponatremia   Closed rib fracture   Anemia of chronic disease   Thrombocytopenia (HCC)   Thoracic compression fracture, closed, initial encounter [facility])   Pathologic fracture of thoracic vertebrae, initial encounter   Malnourished ([place])   Protein-calorie malnutrition, severe ([place])   Cancer cachexia ([place])   Infestation by bed bug   Prostate cancer metastatic to bone ([place])   Hypotension   Ryan [name] is a 60 y.o. male with medical history significant for history of polysubstance abuse including THC, cocaine, alcohol, remote history of tobacco use, history of right proximal femur inter trochanteric fracture status post right IM nail intertrochanteric fixation in 04/02/2021, does not follow-up with a PCP, presents to the emergency department for chief concerns of right hip pain.  Pain is chronic for several months, denies any trauma, difficulty ambulation due to pain. He also endorses an unintentional 35 pound weight loss in the last year, denies bright red blood per rectum and melena. patient was found to have L5 compression fracture, L3/4 severe stenosis.  He was also found to have widespread bony metastasis  from prostate cancer.   Metastatic prostate cancer --with mets to bone with associated L5 compression fracture, L3-L4 severe spinal stenosis with right lower extremity weakness. --Neurosurgery consulted recommended not a surgical candidate --pt has unrealistic expectation that he will be cured from cancer. --Oncology following for inpatient cancer related needs. Follow-up with [name] as outpatient   Severe symptomatic anemia with folic acid deficiency 123456 Hb 7.0, transfused 1 unit of PRBC --Transfuse to keep hemoglobin greater than 7. --cont folic acid supplement    Hypomagnesemia --replete as needed.   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 --appeared to be eating more now. --Continue Decadron and Remeron   Left heel pain, resolved X rays of the foot neg for pathological fractures.   Hypotension: Asymptomatic. Started on midodrine. --keep MAP greater than 65 --cont midodrine down to 5 mg TID     DVT prophylaxis: Lovenox SQ pt declined Code Status: DNR  Family Communication:  Level of care: Med-Surg Dispo:   The patient is from: home Anticipated d/c is to: undetermined Anticipated d/c date is: undetermined Patient currently is medically ready to d/c.   Subjective and Interval History:  Nursing reported pt had a BM in bed because he didn't want to get up.  Talked to pt about it today.  Pt is eating.     Objective: Vitals:   05/27/21 1200 05/27/21 1637 05/27/21 1638 05/27/21 2047  BP: 115/61 131/62 131/66 (!) 107/55  Pulse: 80  73 83  Resp: '16 15 16 18  '$ Temp: 98 F (36.7 C) 98.2 F (36.8 C) 97.7 F (36.5 C) (!) 97.5 F (36.4 C)  TempSrc:  Oral  Oral  SpO2: 100% 100% 100% 99%  Weight:      Height:        Intake/Output Summary (Last 24 hours) at 05/28/2021 0145 Last data filed at 05/27/2021 2045 Gross per 24 hour  Intake 240 ml  Output 1775 ml  Net -1535 ml  Filed Weights   05/17/21 0600 05/24/21 0600 05/25/21 0019  Weight: 46.4 kg 63.1 kg 50.6 kg    Examination:   Constitutional: NAD, AAOx3 HEENT: conjunctivae and lids normal, EOMI CV: No cyanosis.   RESP: normal respiratory effort, on RA Extremities: No effusions, edema in BLE SKIN: warm, dry Neuro: II - XII grossly intact.     Data Reviewed: I have personally  reviewed following labs and imaging studies  CBC: Recent Labs  Lab 05/22/21 0646  WBC 10.5  HGB 7.4*  HCT 23.2*  MCV 87.2  PLT [id number]   Basic Metabolic Panel: No results for input(s): NA, K, CL, CO2, GLUCOSE, BUN, CREATININE, CALCIUM, MG, PHOS in the last 168 hours. GFR: Estimated Creatinine Clearance: 71.2 mL/min (A) (by C-G formula based on SCr of 0.48 mg/dL (L)). Liver Function Tests: No results for input(s): AST, ALT, ALKPHOS, BILITOT, PROT, ALBUMIN in the last 168 hours. No results for input(s): LIPASE, AMYLASE in the last 168 hours. No results for input(s): AMMONIA in the last 168 hours. Coagulation Profile: No results for input(s): INR, PROTIME in the last 168 hours. Cardiac Enzymes: No results for input(s): CKTOTAL, CKMB, CKMBINDEX, TROPONINI in the last 168 hours. BNP (last 3 results) No results for input(s): PROBNP in the last 8760 hours. HbA1C: No results for input(s): HGBA1C in the last 72 hours. CBG: No results for input(s): GLUCAP in the last 168 hours. Lipid Profile: No results for input(s): CHOL, HDL, LDLCALC, TRIG, CHOLHDL, LDLDIRECT in the last 72 hours. Thyroid Function Tests: No results for input(s): TSH, T4TOTAL, FREET4, T3FREE, THYROIDAB in the last 72 hours. Anemia Panel: No results for input(s): VITAMINB12, FOLATE, FERRITIN, TIBC, IRON, RETICCTPCT in the last 72 hours. Sepsis Labs: No results for input(s): PROCALCITON, LATICACIDVEN in the last 168 hours.  No results found for this or any previous visit (from the past 240 hour(s)).    Radiology Studies: No results found.   Scheduled Meds:  dexamethasone  4 mg Oral Daily   enoxaparin (LOVENOX) injection  40 mg Subcutaneous Q24H   feeding supplement  237 mL Oral TID PC & HS   ferrous sulfate  325 mg Oral Q breakfast   folic acid  1 mg Oral Daily   melatonin  5 mg Oral QHS   midodrine  10 mg Oral TID WC   mirtazapine  15 mg Oral QHS   polyethylene glycol  17 g Oral Daily   Continuous  Infusions:   LOS: 59 days     Enzo Bi, MD [facility] If 7PM-7AM, please contact night-coverage 05/28/2021, 1:45 AM

## 2021-05-27 NOTE — Progress Notes (Signed)
Mobility Specialist - Progress Note   05/27/21 1400  Mobility  Activity Refused mobility  Mobility performed by Mobility specialist    2nd attempt this date. Pt continuing to refuse mobility despite max encouragement, no reason specified. Will attempt session another date/time as pt is agreeable.    Kathee Delton Mobility Specialist 05/27/21, 2:58 PM

## 2021-05-28 LAB — CBC
HCT: 27.6 % — ABNORMAL LOW (ref 39.0–52.0)
Hemoglobin: 8.5 g/dL — ABNORMAL LOW (ref 13.0–17.0)
MCH: 27.4 pg (ref 26.0–34.0)
MCHC: 30.8 g/dL (ref 30.0–36.0)
MCV: 89 fL (ref 80.0–100.0)
Platelets: 206 10*3/uL (ref 150–400)
RBC: 3.1 MIL/uL — ABNORMAL LOW (ref 4.22–5.81)
RDW: 23 % — ABNORMAL HIGH (ref 11.5–15.5)
WBC: 8.7 10*3/uL (ref 4.0–10.5)
nRBC: 2.7 % — ABNORMAL HIGH (ref 0.0–0.2)

## 2021-05-28 LAB — BASIC METABOLIC PANEL
Anion gap: 9 (ref 5–15)
BUN: 36 mg/dL — ABNORMAL HIGH (ref 6–20)
CO2: 20 mmol/L — ABNORMAL LOW (ref 22–32)
Calcium: 6.9 mg/dL — ABNORMAL LOW (ref 8.9–10.3)
Chloride: 106 mmol/L (ref 98–111)
Creatinine, Ser: 0.53 mg/dL — ABNORMAL LOW (ref 0.61–1.24)
GFR, Estimated: >60 mL/min (ref >60)
Glucose, Bld: 118 mg/dL — ABNORMAL HIGH (ref 70–99)
Potassium: 4.9 mmol/L (ref 3.5–5.1)
Sodium: 135 mmol/L (ref 135–145)

## 2021-05-28 LAB — PHOSPHORUS: Phosphorus: 3.8 mg/dL (ref 2.5–4.6)

## 2021-05-28 LAB — MAGNESIUM: Magnesium: 1.7 mg/dL (ref 1.7–2.4)

## 2021-05-28 NOTE — Progress Notes (Signed)
Mobility Specialist - Progress Note   05/28/21 1608  Mobility  Activity Refused mobility  Mobility performed by Mobility specialist    85 HR, 112/62 BP, 100% SpO2  Pt was lying in bed upon arrival, utilizing RA. Pt politely declined mobility this date, no reason specified. As mobility turns to exit pt states, "I'm seeing 2 of you." Initially denies dizziness, then responds that he is dizzy. VS checked and recorded above, no s/s of distress. Pt left in bed with all needs in reach. RN notified.   Kathee Delton Mobility Specialist 05/28/21, 4:13 PM

## 2021-05-28 NOTE — Progress Notes (Signed)
PT Cancellation Note  Patient Details Name: Ryan [name] MRN: [id number] DOB: 1961-01-13   Cancelled Treatment:     PT attempt. Pt asleep upon arriving. Does awake however requested author return at later time and day. Acute pt will continue to follow and progress as able per current POC.    Julaine Fusi PTA 05/28/21, 2:18 PM

## 2021-05-28 NOTE — Progress Notes (Signed)
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 male with medical history significant for history of polysubstance abuse including THC, cocaine, alcohol, remote history of tobacco use, history of right proximal femur inter trochanteric fracture status post right IM nail intertrochanteric fixation in 04/02/2021, does not follow-up with a PCP, presents to the emergency department for chief concerns of right hip pain.  Pain is chronic for several months, denies any trauma, difficulty ambulation due to pain. He also endorses an unintentional 35 pound weight loss in the last year, denies bright red blood per rectum and melena. patient was found to have L5 compression fracture, L3/4 severe stenosis.  He was also found to have widespread bony metastasis  from prostate cancer.   Metastatic prostate cancer --with mets to bone with associated L5 compression fracture, L3-L4 severe spinal stenosis with right lower extremity weakness. --Neurosurgery consulted recommended not a surgical candidate --pt has unrealistic expectation that he will be cured from cancer. --Oncology following for inpatient cancer related needs. Follow-up with [name] as outpatient   Severe symptomatic anemia with folic acid deficiency 123456 Hb 7.0, transfused 1 unit of PRBC --Transfuse to keep hemoglobin greater than 7. --cont folic acid supplement    Hypomagnesemia --replete as needed.   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 --appeared to be eating more now. --Continue Decadron and Remeron   Left heel pain, resolved X rays of the foot neg for pathological fractures.   Hypotension: Asymptomatic. Started on midodrine. --keep MAP greater than 65 --cont midodrine down to 5 mg TID     DVT prophylaxis: Lovenox SQ pt declined Code Status: DNR  Family Communication:  Level of care: Med-Surg Dispo:   The patient is from: home Anticipated d/c is to: undetermined.  Currently homeless, family not willing to take him in. Anticipated d/c date is: undetermined Patient currently is medically ready to d/c.   Subjective and Interval History:  Pt had a BM in bed yesterday just because he didn't want to get up.  Today, pt refused both PT and OT.  Labs checked today, unremarkable.   Objective: Vitals:   05/28/21 0512 05/28/21 0910 05/28/21 1154 05/28/21 1602  BP: (!) 101/54 101/64 96/65 112/62  Pulse: 89 89 88 79  Resp: '16 18 15 20  '$ Temp: 98.1 F (36.7 C) 97.8 F (36.6 C) 98.2 F (36.8 C) 98.1 F (36.7 C)  TempSrc:      SpO2: 98% 100% 100% 100%  Weight:      Height:        Intake/Output Summary (Last 24 hours) at 05/28/2021 1804 Last data filed at 05/28/2021 1154 Gross per 24 hour  Intake --  Output  1225 ml  Net -1225 ml   Filed Weights   05/17/21 0600 05/24/21 0600 05/25/21 0019  Weight: 46.4 kg 63.1 kg 50.6 kg    Examination:   Constitutional: NAD, sleeping, cachectic CV: No cyanosis.   RESP: normal respiratory effort, on RA Extremities: No effusions, edema in BLE   Data Reviewed: I have personally reviewed following  labs and imaging studies  CBC: Recent Labs  Lab 05/22/21 0646  WBC 10.5  HGB 7.4*  HCT 23.2*  MCV 87.2  PLT A999333   Basic Metabolic Panel: No results for input(s): NA, K, CL, CO2, GLUCOSE, BUN, CREATININE, CALCIUM, MG, PHOS in the last 168 hours. GFR: Estimated Creatinine Clearance: 71.2 mL/min (A) (by C-G formula based on SCr of 0.48 mg/dL (L)). Liver Function Tests: No results for input(s): AST, ALT, ALKPHOS, BILITOT, PROT, ALBUMIN in the last 168 hours. No results for input(s): LIPASE, AMYLASE in the last 168 hours. No results for input(s): AMMONIA in the last 168 hours. Coagulation Profile: No results for input(s): INR, PROTIME in the last 168 hours. Cardiac Enzymes: No results for input(s): CKTOTAL, CKMB, CKMBINDEX, TROPONINI in the last 168 hours. BNP (last 3 results) No results for input(s): PROBNP in the last 8760 hours. HbA1C: No results for input(s): HGBA1C in the last 72 hours. CBG: No results for input(s): GLUCAP in the last 168 hours. Lipid Profile: No results for input(s): CHOL, HDL, LDLCALC, TRIG, CHOLHDL, LDLDIRECT in the last 72 hours. Thyroid Function Tests: No results for input(s): TSH, T4TOTAL, FREET4, T3FREE, THYROIDAB in the last 72 hours. Anemia Panel: No results for input(s): VITAMINB12, FOLATE, FERRITIN, TIBC, IRON, RETICCTPCT in the last 72 hours. Sepsis Labs: No results for input(s): PROCALCITON, LATICACIDVEN in the last 168 hours.  No results found for this or any previous visit (from the past 240 hour(s)).    Radiology Studies: No results found.   Scheduled Meds:  dexamethasone  4 mg Oral Daily   enoxaparin (LOVENOX) injection  40 mg Subcutaneous Q24H   feeding supplement  237 mL Oral TID PC & HS   ferrous sulfate  325 mg Oral Q breakfast   folic acid  1 mg Oral Daily   melatonin  5 mg Oral QHS   midodrine  10 mg Oral TID WC   mirtazapine  15 mg Oral QHS   polyethylene glycol  17 g Oral Daily   Continuous Infusions:   LOS: 59 days      Enzo Bi, MD [facility] If 7PM-7AM, please contact night-coverage 05/28/2021, 6:04 PM

## 2021-05-28 NOTE — Progress Notes (Signed)
OT Cancellation Note  Patient Details Name: Ryan [name] MRN: [id number] DOB: 27-Mar-1961   Cancelled Treatment:    Reason Eval/Treat Not Completed: Patient declined, no reason specified. Pt declining therapy this afternoon. Agreeable to re-attempt at later date/time as able.   Hanley Hays, MPH, MS, OTR/L ascom [phone] 05/28/21, 4:33 PM

## 2021-05-29 NOTE — Progress Notes (Signed)
Patient has called out on multiple occasions that he has had a bowel movement that he cleaned himself but got stool on the linens. I have encouraged patient that when this happens he should get up to bathroom or bedside commode to have a complete bowel movement to possibly limit the amount of the times he is having these situations, patient refuses. Concerned if these are actually incontinent episodes considering the L5 compression fracture and metastasis. Will notify attending physician

## 2021-05-29 NOTE — Progress Notes (Signed)
Mobility Specialist - Progress Note   05/29/21 1557  Mobility  Activity Refused mobility  Mobility performed by Mobility specialist    Pt sleeping in bed upon arrival. Pt easily awakened by voice, but eyes remain closed. Pt declined mobility this date despite encouragement, "I'll get up later, I promise." Will attempt session another date.    Kathee Delton Mobility Specialist 05/29/21, 4:03 PM

## 2021-05-29 NOTE — Progress Notes (Signed)
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 male with medical history significant for history of polysubstance abuse including THC, cocaine, alcohol, remote history of tobacco use, history of right proximal femur inter trochanteric fracture status post right IM nail intertrochanteric fixation in 04/02/2021, does not follow-up with a PCP, presents to the emergency department for chief concerns of right hip pain.  Pain is chronic for several months, denies any trauma, difficulty ambulation due to pain. He also endorses an unintentional 35 pound weight loss in the last year, denies bright red blood per rectum and melena. patient was found to have L5 compression fracture, L3/4 severe stenosis.  He was also found to have widespread bony metastasis  from prostate cancer.   Metastatic prostate cancer --with mets to bone with associated L5 compression fracture, L3-L4 severe spinal stenosis with right lower extremity weakness. --Neurosurgery consulted recommended not a surgical candidate --pt has unrealistic expectation that he will be cured from cancer. --Oncology following for inpatient cancer related needs. Follow-up with Ryan [name] as outpatient   Severe symptomatic anemia with folic acid deficiency 123456 Hb 7.0, transfused 1 unit of PRBC --Transfuse to keep hemoglobin greater than 7. --cont folic acid supplement    Hypomagnesemia --replete as needed.   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 --appeared to be eating more now. --Continue Decadron and Remeron   Left heel pain, resolved X rays of the foot neg for pathological fractures.   Hypotension: Asymptomatic. Started on midodrine. --keep MAP greater than 65 --cont midodrine down to 5 mg TID     DVT prophylaxis: Lovenox SQ pt declined Code Status: DNR  Family Communication:  Level of care: Med-Surg Dispo:   The patient is from: home Anticipated d/c is to: undetermined.  Currently homeless, family not willing to take him in. Anticipated d/c date is: undetermined Patient currently is medically ready to d/c.   Subjective and Interval History:  Yesterday, pt declined to work with PT/OT and mobility tech.  Today when I asked about it, he denied it and said that he did walk with them and said that he was ready and waiting on therapy.  Later today, he declined again to work with mobility tech.  Pt has also been having BM in bed, and refuses to get up to use the bedside commode.  And again, when I asked about it, he said he would use the bedside commode, but then continued to have BM in bed.    Pt does not appear to be confused.     Objective: Vitals:   05/29/21 0534 05/29/21 0746 05/29/21 1130 05/29/21 1610  BP: (!) 119/57 98/67 106/64 110/67  Pulse: 80 89  87 85  Resp: '18 16 20 17  '$ Temp: 98.2 F (36.8 C) 97.6 F (36.4 C)  99 F (37.2 C)  TempSrc: Oral   Oral  SpO2: 99% 99% 99% 100%  Weight:      Height:        Intake/Output Summary (Last 24 hours) at 05/29/2021 1631 Last data filed at 05/29/2021 1142 Gross per 24 hour  Intake 237 ml   Output 1550 ml  Net -1313 ml   Filed Weights   05/17/21 0600 05/24/21 0600 05/25/21 0019  Weight: 46.4 kg 63.1 kg 50.6 kg    Examination:   Constitutional: NAD, AAOx3 HEENT: conjunctivae and lids normal, EOMI CV: No cyanosis.   RESP: normal respiratory effort, on RA Neuro: II - XII grossly intact.     Data Reviewed: I have personally reviewed following labs and imaging studies  CBC: Recent Labs  Lab 05/28/21 1822  WBC 8.7  HGB 8.5*  HCT 27.6*  MCV 89.0  PLT 99991111   Basic Metabolic Panel: Recent Labs  Lab 05/28/21 1822  NA 135  K 4.9  CL 106  CO2 20*  GLUCOSE 118*  BUN 36*  CREATININE 0.53*  CALCIUM 6.9*  MG 1.7  PHOS 3.8   GFR: Estimated Creatinine Clearance: 71.2 mL/min (A) (by C-G formula based on SCr of 0.53 mg/dL (L)). Liver Function Tests: No results for input(s): AST, ALT, ALKPHOS, BILITOT, PROT, ALBUMIN in the last 168 hours. No results for input(s): LIPASE, AMYLASE in the last 168 hours. No results for input(s): AMMONIA in the last 168 hours. Coagulation Profile: No results for input(s): INR, PROTIME in the last 168 hours. Cardiac Enzymes: No results for input(s): CKTOTAL, CKMB, CKMBINDEX, TROPONINI in the last 168 hours. BNP (last 3 results) No results for input(s): PROBNP in the last 8760 hours. HbA1C: No results for input(s): HGBA1C in the last 72 hours. CBG: No results for input(s): GLUCAP in the last 168 hours. Lipid Profile: No results for input(s): CHOL, HDL, LDLCALC, TRIG, CHOLHDL, LDLDIRECT in the last 72 hours. Thyroid Function Tests: No results for input(s): TSH, T4TOTAL, FREET4, T3FREE, THYROIDAB in the last 72 hours. Anemia Panel: No results for input(s): VITAMINB12, FOLATE, FERRITIN, TIBC, IRON, RETICCTPCT in the last 72 hours. Sepsis Labs: No results for input(s): PROCALCITON, LATICACIDVEN in the last 168 hours.  No results found for this or any previous visit (from the past 240 hour(s)).    Radiology Studies: No results  found.   Scheduled Meds:  dexamethasone  4 mg Oral Daily   enoxaparin (LOVENOX) injection  40 mg Subcutaneous Q24H   feeding supplement  237 mL Oral TID PC & HS   ferrous sulfate  325 mg Oral Q breakfast   folic acid  1 mg Oral Daily   melatonin  5 mg Oral QHS   midodrine  10 mg Oral TID WC   mirtazapine  15 mg Oral QHS   polyethylene glycol  17 g Oral Daily   Continuous Infusions:   LOS: 60 days     Enzo Bi, MD [facility] If 7PM-7AM, please contact night-coverage 05/29/2021, 4:31 PM

## 2021-05-30 NOTE — Plan of Care (Signed)

## 2021-05-30 NOTE — Progress Notes (Signed)
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 male with medical history significant for history of polysubstance abuse including THC, cocaine, alcohol, remote history of tobacco use, history of right proximal femur inter trochanteric fracture status post right IM nail intertrochanteric fixation in 04/02/2021, does not follow-up with a PCP, presents to the emergency department for chief concerns of right hip pain.  Pain is chronic for several months, denies any trauma, difficulty ambulation due to pain. He also endorses an unintentional 35 pound weight loss in the last year, denies bright red blood per rectum and melena. patient was found to have L5 compression fracture, L3/4 severe stenosis.  He was also found to have widespread bony metastasis  from prostate cancer.   Metastatic prostate cancer --with mets to bone with associated L5 compression fracture, L3-L4 severe spinal stenosis with right lower extremity weakness. --Neurosurgery consulted recommended not a surgical candidate --pt has unrealistic expectation that he will be cured from cancer. --Oncology following for inpatient cancer related needs. Follow-up with [name] as outpatient   Severe symptomatic anemia with folic acid deficiency 123456 Hb 7.0, transfused 1 unit of PRBC --Transfuse to keep hemoglobin greater than 7. --cont folic acid supplement    Hypomagnesemia --replete as needed.   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 --appeared to be eating more now. --Continue Decadron and Remeron   Left heel pain, resolved X rays of the foot neg for pathological fractures.   Hypotension: Asymptomatic. Started on midodrine. --keep MAP greater than 65 --cont midodrine down to 5 mg TID     DVT prophylaxis: Lovenox SQ pt declined Code Status: DNR  Family Communication:  Level of care: Med-Surg Dispo:   The patient is from: home Anticipated d/c is to: undetermined.  Currently homeless, family not willing to take him in. Anticipated d/c date is: undetermined Patient currently is medically ready to d/c.   Subjective and Interval History:  When asked to participate in PT/OT and mobility, and to get out of bed for BM's, pt would always agree, however, continued to refuse to get out of bed for therapy and BM's.  Pt said he knows when he is going to have a BM.    Pt does not appear confused.  Pt appears to be manipulative.      Objective: Vitals:   05/29/21 2234 05/30/21 0607 05/30/21 0720 05/30/21 1149  BP: 105/61 127/67 120/69 116/61  Pulse: 70 76 79 74  Resp: '16 16 16 18  '$ Temp: 98.4 F (36.9 C) 98 F (36.7 C) 98.1 F (36.7 C) 98.5 F (36.9 C)  TempSrc:   Oral Oral  SpO2: 100% 100% 100% 100%  Weight:  Height:        Intake/Output Summary (Last 24 hours) at 05/30/2021 1542 Last data filed at 05/30/2021 1140 Gross per 24 hour  Intake --  Output 2100 ml  Net -2100 ml   Filed Weights   05/17/21 0600 05/24/21 0600 05/25/21 0019  Weight: 46.4 kg 63.1 kg 50.6 kg    Examination:   Constitutional: NAD, AAOx3 HEENT:  conjunctivae and lids normal, EOMI CV: No cyanosis.   RESP: normal respiratory effort, on RA Extremities: No effusions, edema in BLE SKIN: warm, dry Neuro: II - XII grossly intact.     Data Reviewed: I have personally reviewed following labs and imaging studies  CBC: Recent Labs  Lab 05/28/21 1822  WBC 8.7  HGB 8.5*  HCT 27.6*  MCV 89.0  PLT 99991111   Basic Metabolic Panel: Recent Labs  Lab 05/28/21 1822  NA 135  K 4.9  CL 106  CO2 20*  GLUCOSE 118*  BUN 36*  CREATININE 0.53*  CALCIUM 6.9*  MG 1.7  PHOS 3.8   GFR: Estimated Creatinine Clearance: 71.2 mL/min (A) (by C-G formula based on SCr of 0.53 mg/dL (L)). Liver Function Tests: No results for input(s): AST, ALT, ALKPHOS, BILITOT, PROT, ALBUMIN in the last 168 hours. No results for input(s): LIPASE, AMYLASE in the last 168 hours. No results for input(s): AMMONIA in the last 168 hours. Coagulation Profile: No results for input(s): INR, PROTIME in the last 168 hours. Cardiac Enzymes: No results for input(s): CKTOTAL, CKMB, CKMBINDEX, TROPONINI in the last 168 hours. BNP (last 3 results) No results for input(s): PROBNP in the last 8760 hours. HbA1C: No results for input(s): HGBA1C in the last 72 hours. CBG: No results for input(s): GLUCAP in the last 168 hours. Lipid Profile: No results for input(s): CHOL, HDL, LDLCALC, TRIG, CHOLHDL, LDLDIRECT in the last 72 hours. Thyroid Function Tests: No results for input(s): TSH, T4TOTAL, FREET4, T3FREE, THYROIDAB in the last 72 hours. Anemia Panel: No results for input(s): VITAMINB12, FOLATE, FERRITIN, TIBC, IRON, RETICCTPCT in the last 72 hours. Sepsis Labs: No results for input(s): PROCALCITON, LATICACIDVEN in the last 168 hours.  No results found for this or any previous visit (from the past 240 hour(s)).    Radiology Studies: No results found.   Scheduled Meds:  dexamethasone  4 mg Oral Daily   enoxaparin (LOVENOX) injection  40 mg Subcutaneous Q24H    feeding supplement  237 mL Oral TID PC & HS   ferrous sulfate  325 mg Oral Q breakfast   folic acid  1 mg Oral Daily   melatonin  5 mg Oral QHS   midodrine  10 mg Oral TID WC   mirtazapine  15 mg Oral QHS   polyethylene glycol  17 g Oral Daily   Continuous Infusions:   LOS: 61 days     Enzo Bi, MD [facility] If 7PM-7AM, please contact night-coverage 05/30/2021, 3:42 PM

## 2021-05-31 NOTE — Progress Notes (Addendum)
PT Cancellation Note  Patient Details Name: Ryan [name] MRN: [id number] DOB: August 06, 1961   Cancelled Treatment:    Reason Eval/Treat Not Completed: Other (comment). Chart reviewed. Pt received supine in bed asking for grape juice. PT explained role and asked pt if he would like to participate. He pleasantly deferred therapy stating "the trazodone just kicked in." Per chart, it does not appear pt has received trazodone on this date. Pt states he is agreeable to PT this afternoon. Will follow up today.    Patrina Levering PT, DPT 05/31/21 1:00 PM [phone]

## 2021-05-31 NOTE — Progress Notes (Signed)
PROGRESS NOTE    Ryan [name]  [id number] DOB: [birth year] DOA: 03/30/2021 PCP: Pcp, No  114A/114A-AA   Assessment & Plan:   Principal Problem:   Symptomatic anemia Active Problems:   Hyponatremia   Closed rib fracture   Anemia of chronic disease   Thrombocytopenia (HCC)   Thoracic compression fracture, closed, initial encounter [facility])   Pathologic fracture of thoracic vertebrae, initial encounter   Malnourished ([place])   Protein-calorie malnutrition, severe ([place])   Cancer cachexia ([place])   Infestation by bed bug   Prostate cancer metastatic to bone ([place])   Hypotension   Ryan [name] is a 60 y.o. male with medical history significant for history of polysubstance abuse including THC, cocaine, alcohol, remote history of tobacco use, history of right proximal femur inter trochanteric fracture status post right IM nail intertrochanteric fixation in 04/02/2021, does not follow-up with a PCP, presents to the emergency department for chief concerns of right hip pain.  Pain is chronic for several months, denies any trauma, difficulty ambulation due to pain. He also endorses an unintentional 35 pound weight loss in the last year, denies bright red blood per rectum and melena. patient was found to have L5 compression fracture, L3/4 severe stenosis.  He was also found to have widespread bony metastasis  from prostate cancer.   Metastatic prostate cancer --with mets to bone with associated L5 compression fracture, L3-L4 severe spinal stenosis with right lower extremity weakness. --Neurosurgery consulted recommended not a surgical candidate --pt has unrealistic expectation that he will be cured from cancer. --Oncology following for inpatient cancer related needs. Follow-up with [name] as outpatient   Severe symptomatic anemia with folic acid deficiency 123456 Hb 7.0, transfused 1 unit of PRBC --Transfuse to keep hemoglobin greater than 7. --cont folic acid supplement    Hypomagnesemia --replete as needed.   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 --appeared to be eating more now. --Continue Decadron and Remeron   Left heel pain, resolved X rays of the foot neg for pathological fractures.   Hypotension: Asymptomatic. Started on midodrine. --keep MAP greater than 65 --cont midodrine down to 5 mg TID     DVT prophylaxis: Lovenox SQ pt declined Code Status: DNR  Family Communication:  Level of care: Med-Surg Dispo:   The patient is from: home Anticipated d/c is to: shelter Anticipated d/c date is: 1-2 days, per [facility] planning Patient currently is medically ready to d/c.   Subjective and Interval History:  Similar interaction today, with pt responding affirmative to everything.  TOC leadership started planning to discharge pt to shelter.  I let the pt know.   Objective: Vitals:   05/31/21 0556 05/31/21 0741 05/31/21 1223 05/31/21 1555  BP:  97/66 97/75 107/66  Pulse:  70 85 77  Resp:  '16 16 20  '$ Temp:  98.5 F (36.9 C) 97.7 F (36.5 C) 98.3 F (36.8 C)  TempSrc:   Oral Oral  SpO2:  99% 100% 99%  Weight: 52.9 kg     Height:        Intake/Output Summary (Last 24 hours) at 05/31/2021 1811 Last data filed at 05/31/2021 1420 Gross per 24 hour  Intake 720 ml  Output 940 ml  Net -220 ml   [employer]  05/24/21 0600 05/25/21 0019 05/31/21 0556  Weight: 63.1 kg 50.6 kg 52.9 kg    Examination:   Constitutional: NAD, AAOx3 HEENT: conjunctivae and lids normal, EOMI CV: No cyanosis.   RESP: normal respiratory effort, on RA Extremities: No effusions, edema in BLE SKIN: warm, dry Neuro: II - XII grossly intact.     Data Reviewed: I have  personally reviewed following labs and imaging studies  CBC: Recent Labs  Lab 05/28/21 1822  WBC 8.7  HGB 8.5*  HCT 27.6*  MCV 89.0  PLT 99991111   Basic Metabolic Panel: Recent Labs  Lab 05/28/21 1822  NA 135  K 4.9  CL 106  CO2 20*  GLUCOSE 118*  BUN 36*  CREATININE 0.53*  CALCIUM 6.9*  MG 1.7  PHOS 3.8   GFR: Estimated Creatinine Clearance: 74.4 mL/min (A) (by C-G formula based on SCr of 0.53 mg/dL (L)). Liver Function Tests: No results for input(s): AST, ALT, ALKPHOS, BILITOT, PROT, ALBUMIN in the last 168 hours. No results for input(s): LIPASE, AMYLASE in the last 168 hours. No results for input(s): AMMONIA in the last 168 hours. Coagulation Profile: No results for input(s): INR, PROTIME in the last 168 hours. Cardiac Enzymes: No results for input(s): CKTOTAL, CKMB, CKMBINDEX, TROPONINI in the last 168 hours. BNP (last 3 results) No results for input(s): PROBNP in the last 8760 hours. HbA1C: No results for input(s): HGBA1C in the last 72 hours. CBG: No results for input(s): GLUCAP in the last 168 hours. Lipid Profile: No results for input(s): CHOL, HDL, LDLCALC, TRIG, CHOLHDL, LDLDIRECT in the last 72 hours. Thyroid Function Tests: No results for input(s): TSH, T4TOTAL, FREET4, T3FREE, THYROIDAB in the last 72 hours. Anemia Panel: No results for input(s): VITAMINB12, FOLATE, FERRITIN, TIBC, IRON, RETICCTPCT in the last 72 hours. Sepsis Labs: No results for input(s): PROCALCITON, LATICACIDVEN in the last 168 hours.  No results found for this or any previous visit (from the past 240 hour(s)).    Radiology Studies: No results found.   Scheduled Meds:  dexamethasone  4 mg Oral Daily   enoxaparin (LOVENOX) injection  40 mg Subcutaneous Q24H   feeding supplement  237 mL Oral TID PC & HS   ferrous sulfate  325 mg Oral Q breakfast   folic acid  1 mg Oral Daily   melatonin  5 mg Oral QHS   midodrine  10 mg Oral TID WC   mirtazapine  15 mg Oral QHS    polyethylene glycol  17 g Oral Daily   Continuous Infusions:   LOS: 62 days     Enzo Bi, MD [facility] If 7PM-7AM, please contact night-coverage 05/31/2021, 6:11 PM

## 2021-05-31 NOTE — Progress Notes (Signed)
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   OT comments  [name] did very well in OT today, able to maintain sitting balance EOB for 30+ minutes, occasionally needing to lean to one side and use UE for support, 2/2 fatigue, but remained engaged and willing to participate throughout long session. Pt performed UB bathing, shampooing, hair combing, shaving, toothbrushing, all with Mod I following set-up. Pt received with breakfast tray; he ate well when given encouragement to do so. Pt interested in engaging in conversation re: life review, consideration of next steps. [name] reports that he has been estranged from his 3 children but that he has contacted them recently; had a very good, honest conversation with one of his sons, and has spoken to his 69-year-old grandson. Pt's son is now sharing photos of grandson with pt. While pt performed seated ADLs, therapist and pt discussed pt's ideas for connecting with his other children. DC plan remains appropriate.     Follow Up Recommendations  Home health OT    Equipment Recommendations  None recommended by OT    Recommendations for Other Services      Precautions / Restrictions Precautions Precautions: Fall Spinal Brace: Thoracolumbosacral orthotic Spinal Brace Comments: PRN for comfort Other Brace: LLE post op shoe, PRN for pain       Mobility Bed Mobility Overal bed mobility: Modified Independent Bed Mobility: Supine to Sit;Sit to Supine     Supine to sit:  Modified independent (Device/Increase time) Sit to supine: Modified independent (Device/Increase time)        Transfers                      Balance Overall balance assessment: Needs assistance Sitting-balance support: No upper extremity supported;Feet supported Sitting balance-Leahy Scale: Good Sitting balance - Comments: pt sits EOB for >30+ minutes, occassionally needing to lean on L UE for support, 2/2 fatigue Postural control: Left lateral lean                                 ADL either performed or assessed with clinical judgement   ADL Overall ADL's : Needs assistance/impaired Eating/Feeding: Modified independent   Grooming: Wash/dry face;Oral care;Brushing hair;Set up;Sitting;Supervision/safety Grooming Details (indicate cue type and reason): shampoo, shaving, brushing teeth, washing up, sitting EOB             Lower Body Dressing: Sitting/lateral leans;Modified independent Lower Body Dressing Details (indicate cue type and reason): donning socks               General ADL Comments: Pt eager to engage in therapy this day.     Vision Ability to See in Adequate Light: 0 Adequate Patient Visual Report: No change from baseline     Perception     Praxis      Cognition Arousal/Alertness: Awake/alert Behavior During Therapy: WFL for tasks assessed/performed Overall Cognitive Status: Within Functional Limits for tasks assessed  General Comments: fairly "flat" at beginning of session, mood improving with activity        Exercises Other Exercises Other Exercises: bed mobility, transfers, grooming, LB dressing, sitting balance tolerance, discussion re: goals of care   Shoulder Instructions       General Comments      Pertinent Vitals/ Pain       Pain Assessment: 0-10 Faces Pain Scale: Hurts a little bit Pain Location: reports numbness in b/l LE, more pronounced on R Pain  Descriptors / Indicators: Numbness Pain Intervention(s): Repositioned;Monitored during session  Home Living                                          Prior Functioning/Environment              Frequency  Min 1X/week        Progress Toward Goals  OT Goals(current goals can now be found in the care plan section)  Progress towards OT goals: Progressing toward goals  Acute Rehab OT Goals Patient Stated Goal: to get stronger OT Goal Formulation: With patient Time For Goal Achievement: 06/07/21  Plan Frequency remains appropriate;Discharge plan needs to be updated    Co-evaluation                 AM-PAC OT "6 Clicks" Daily Activity     Outcome Measure   Help from another person eating meals?: None Help from another person taking care of personal grooming?: A Little Help from another person toileting, which includes using toliet, bedpan, or urinal?: A Lot Help from another person bathing (including washing, rinsing, drying)?: A Lot Help from another person to put on and taking off regular upper body clothing?: A Little Help from another person to put on and taking off regular lower body clothing?: A Little 6 Click Score: 17    End of Session    OT Visit Diagnosis: Unsteadiness on feet (R26.81);Muscle weakness (generalized) (M62.81)   Activity Tolerance Patient tolerated treatment well   Patient Left in bed;with call bell/phone within reach;with bed alarm set   Nurse Communication          Time: [id number] OT Time Calculation (min): 55 min  Charges: OT General Charges $OT Visit: 1 Visit OT Treatments $Self Care/Home Management : 53-67 mins  Josiah Lobo, PhD, MS, OTR/L 05/31/21, 3:47 PM

## 2021-06-01 MED ORDER — DEGARELIX ACETATE(240 MG DOSE) 120 MG/VIAL ~~LOC~~ SOLR
120.0000 mg | Freq: Once | SUBCUTANEOUS | Status: AC
  Administered 2021-06-02: 120 mg via SUBCUTANEOUS
  Filled 2021-06-01: qty 3

## 2021-06-01 MED ORDER — ZOLEDRONIC ACID 4 MG/5ML IV CONC
4.0000 mg | Freq: Once | INTRAVENOUS | Status: AC
  Administered 2021-06-02: 4 mg via INTRAVENOUS
  Filled 2021-06-01: qty 5

## 2021-06-01 NOTE — Progress Notes (Signed)
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 male with medical history significant for history of polysubstance abuse including THC, cocaine, alcohol, remote history of tobacco use, history of right proximal femur inter trochanteric fracture status post right IM nail intertrochanteric fixation in 04/02/2021, does not follow-up with a PCP, presents to the emergency department for chief concerns of right hip pain.  Pain is chronic for several months, denies any trauma, difficulty ambulation due to pain. He also endorses an unintentional 35 pound weight loss in the last year, denies bright red blood per rectum and melena. patient was found to have L5 compression fracture, L3/4 severe stenosis.  He was also found to have widespread bony metastasis  from prostate cancer.   Metastatic prostate cancer --with mets to bone with associated L5 compression fracture, L3-L4 severe spinal stenosis with right lower extremity weakness. --Neurosurgery consulted recommended not a surgical candidate --pt has unrealistic expectation that he will be cured from cancer. --Oncology following for inpatient cancer related needs.  Plan: --will follow up with [name] as outpatient after discharge ([name] to arrange).  Severe symptomatic anemia with folic acid deficiency 123456 Hb 7.0, transfused 1 unit of PRBC --Transfuse to keep hemoglobin greater  than 7. --cont folic acid supplement   Hypomagnesemia --replete as needed.   Hyponatremia --Likely multifactorial secondary to possible SIADH versus hypovolemia --Appears chronic and asymptomatic --oral hydration     non anion gap metabolic acidosis 99991111 started po bicarb 650 TID x 4 days   Cancer cachexia with severe protein calorie malnutrition Dietary on board recommend encouraging oral intake. --appeared to be eating more now. --Continue Decadron and Remeron   Left heel pain, resolved X rays of the foot neg for pathological fractures.   Hypotension: Asymptomatic. Started on midodrine. --keep MAP greater than 65 --cont midodrine    DVT prophylaxis: Lovenox SQ pt declined Code Status: DNR  Family Communication:  Level of care: Med-Surg Dispo:   The patient is from: home Anticipated d/c is to: SNF, group home or shelter Anticipated d/c date is: whenever TOC locates a facility for pt  Patient currently is medically ready to d/c.  Note by Enzo Bi, MD: "When asked to participate in PT/OT and mobility, and to get out of bed for BM's, pt would always agree, however, continued to refuse to get out of bed for therapy and BM's.  Pt said he knows when he is going to have a BM.  Pt does not appear confused.  Pt appears to be manipulative."   Subjective and Interval History:  Pt again declined all effort at therapy and mobility (4 attempts made today).  TOC working on placing pt.   Objective: Vitals:   06/01/21 0500 06/01/21 0758 06/01/21 1145 06/01/21 1540  BP: 107/66 (!) 114/56 118/69 129/61  Pulse: 81 82 82 80  Resp: '16 16 17 17  '$ Temp: 98.8 F (37.1 C) 98.2 F (36.8  C) 98.1 F (36.7 C) 97.6 F (36.4 C)  TempSrc:  Oral Oral Oral  SpO2: 99% 99% 100% 100%  Weight:      Height:        Intake/Output Summary (Last 24 hours) at 06/01/2021 1722 Last data filed at 06/01/2021 0128 Gross per 24 hour  Intake --  Output 250 ml  Net -250 ml   [employer]    05/24/21 0600 05/25/21 0019 05/31/21 0556  Weight: 63.1 kg 50.6 kg 52.9 kg    Examination:   Constitutional: NAD, AAOx3 HEENT: conjunctivae and lids normal, EOMI CV: No cyanosis.   RESP: normal respiratory effort, on RA Neuro: II - XII grossly intact.     Data Reviewed: I have personally reviewed following labs and imaging studies  CBC: Recent Labs  Lab 05/28/21 1822  WBC 8.7  HGB 8.5*  HCT 27.6*  MCV 89.0  PLT 99991111   Basic Metabolic Panel: Recent Labs  Lab 05/28/21 1822  NA 135  K 4.9  CL 106  CO2 20*  GLUCOSE 118*  BUN 36*  CREATININE 0.53*  CALCIUM 6.9*  MG 1.7  PHOS 3.8   GFR: Estimated Creatinine Clearance: 74.4 mL/min (A) (by C-G formula based on SCr of 0.53 mg/dL (L)). Liver Function Tests: No results for input(s): AST, ALT, ALKPHOS, BILITOT, PROT, ALBUMIN in the last 168 hours. No results for input(s): LIPASE, AMYLASE in the last 168 hours. No results for input(s): AMMONIA in the last 168 hours. Coagulation Profile: No results for input(s): INR, PROTIME in the last 168 hours. Cardiac Enzymes: No results for input(s): CKTOTAL, CKMB, CKMBINDEX, TROPONINI in the last 168 hours. BNP (last 3 results) No results for input(s): PROBNP in the last 8760 hours. HbA1C: No results for input(s): HGBA1C in the last 72 hours. CBG: No results for input(s): GLUCAP in the last 168 hours. Lipid Profile: No results for input(s): CHOL, HDL, LDLCALC, TRIG, CHOLHDL, LDLDIRECT in the last 72 hours. Thyroid Function Tests: No results for input(s): TSH, T4TOTAL, FREET4, T3FREE, THYROIDAB in the last 72 hours. Anemia Panel: No results for input(s): VITAMINB12, FOLATE, FERRITIN, TIBC, IRON, RETICCTPCT in the last 72 hours. Sepsis Labs: No results for input(s): PROCALCITON, LATICACIDVEN in the last 168 hours.  No results found for this or any previous visit (from the past 240 hour(s)).    Radiology Studies: No results found.   Scheduled Meds:  [START ON 06/02/2021]  degarelix  120 mg Subcutaneous Once   dexamethasone  4 mg Oral Daily   enoxaparin (LOVENOX) injection  40 mg Subcutaneous Q24H   feeding supplement  237 mL Oral TID PC & HS   ferrous sulfate  325 mg Oral Q breakfast   folic acid  1 mg Oral Daily   melatonin  5 mg Oral QHS   midodrine  10 mg Oral TID WC   mirtazapine  15 mg Oral QHS   polyethylene glycol  17 g Oral Daily   Continuous Infusions:  [START ON 06/02/2021] zoledronic acid (ZOMETA) IV       LOS: 63 days     Enzo Bi, MD [facility] If 7PM-7AM, please contact night-coverage 06/01/2021, 5:22 PM

## 2021-06-01 NOTE — Progress Notes (Signed)
PT Cancellation Note  Patient Details Name: Ryan [name] MRN: [id number] DOB: 21-Jan-1961   Cancelled Treatment:    Reason Eval/Treat Not Completed: Other (comment). PT returned for second attempt at treatment today. Pt received eating lunch at 2pm. He also states he is tired this afternoon. PT and pt scheduled a therapy time for tomorrow in which pt will be most likely to participate; late morning between 10-11am. Will attempt PT treatment at later date.   Patrina Levering PT, DPT 06/01/21 2:06 PM [phone]

## 2021-06-01 NOTE — Progress Notes (Signed)
OT Cancellation Note  Patient Details Name: Ryan [name] MRN: [id number] DOB: 18-Aug-1961   Cancelled Treatment:    Reason Eval/Treat Not Completed: Patient declined, no reason specified. Pt declining therapy this afternoon. Will re-attempt at later date/time as able.  Hanley Hays, MPH, MS, OTR/L ascom [phone] 06/01/21, 2:59 PM

## 2021-06-01 NOTE — Progress Notes (Signed)
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  INTERVAL HISTORY: Patient remains in the hospital secondary to difficult placement issues.  Possible discharge to homeless shelter in the next several days. He continues to have good appetite.  He does not complain of pain today.  Patient offers no specific complaints.  REVIEW OF SYSTEMS:   Review of Systems  Constitutional:  Positive for malaise/fatigue. Negative for fever and weight loss.  Respiratory: Negative.  Negative for cough, hemoptysis and shortness of breath.   Cardiovascular: Negative.  Negative for chest pain and leg swelling.  Gastrointestinal: Negative.  Negative for abdominal pain.  Genitourinary: Negative.  Negative for dysuria.  Musculoskeletal: Negative.  Negative for back pain.  Skin: Negative.  Negative for rash.  Neurological:  Positive for weakness. Negative for dizziness, sensory change, focal weakness and headaches.  Psychiatric/Behavioral: Negative.  The patient is not nervous/anxious.    As per HPI. Otherwise, a complete review of systems is negative.  PAST MEDICAL HISTORY: Past Medical History:  Diagnosis Date   Adenomatous colon polyp    Anemia    Depression    Elevated LFTs     PAST SURGICAL HISTORY: Past Surgical History:  Procedure Laterality Date   FRACTURE SURGERY     INTRAMEDULLARY (IM) NAIL INTERTROCHANTERIC Right 04/02/2020   Procedure: INTRAMEDULLARY (IM) NAIL INTERTROCHANTRIC;  Surgeon: Corky Mull, MD;  Location: [facility] ORS;  Service: Orthopedics;  Laterality: Right;    FAMILY HISTORY: Family History  Problem Relation Age of Onset   Heart disease Other    Colon polyps Cousin        maternal   Colon polyps Cousin    Colon polyps Maternal Aunt    Colon cancer Maternal  Grandfather     ADVANCED DIRECTIVES (Y/N):  '@ADVDIR'$ @  HEALTH MAINTENANCE: Social History   Tobacco Use   Smoking status: Some Days   Smokeless tobacco: Never  Substance Use Topics   Alcohol use: Yes    Comment: 4 beers weekly   Drug use: No     Colonoscopy:  PAP:  Bone density:  Lipid panel:  No Known Allergies  Current Facility-Administered Medications  Medication Dose Route Frequency Provider Last Rate Last Admin   acetaminophen (TYLENOL) tablet 650 mg  650 mg Oral Q4H PRN Sreenath, Sudheer B, MD       benzonatate (TESSALON) capsule 200 mg  200 mg Oral BID PRN Athena Masse, MD   200 mg at 05/07/21 2003   bisacodyl (DULCOLAX) EC tablet 5 mg  5 mg Oral Daily PRN Val Riles, MD       bisacodyl (DULCOLAX) suppository 10 mg  10 mg Rectal Daily PRN Val Riles, MD       Derrill Memo ON 06/02/2021] degarelix [facility]) injection 120 mg  120 mg Subcutaneous Once Lloyd Huger, MD       dexamethasone (DECADRON) tablet 4 mg  4 mg Oral Daily Sreenath, Sudheer B, MD   4 mg at 06/01/21 [id number]   diclofenac Sodium (VOLTAREN) 1 % topical gel 2 g  2 g Topical PRN Nolberto Hanlon, MD   2 g at 05/06/21 0359   enoxaparin (LOVENOX) injection 40 mg  40 mg Subcutaneous Q24H Eleonore Chiquito S, RPH       feeding supplement (ENSURE ENLIVE / ENSURE PLUS) liquid 237 mL  237 mL Oral TID PC & HS Sreenath, Sudheer B, MD   237 mL at 06/01/21 1246   ferrous sulfate tablet 325 mg  325 mg Oral Q breakfast Cox, Amy N, DO   325 mg at 0000000 [id number]   folic acid (FOLVITE) tablet 1 mg  1 mg Oral Daily Cox, Amy N, DO   1 mg at 06/01/21 0858   guaiFENesin-dextromethorphan (ROBITUSSIN DM) 100-10 MG/5ML syrup 5 mL  5 mL Oral Q8H PRN Hosie Poisson, MD   5 mL at 05/09/21 0333   hydrOXYzine (ATARAX/VISTARIL) tablet 25 mg  25 mg Oral TID PRN Val Riles, MD   25 mg at 05/07/21 2236   ibuprofen (ADVIL) tablet 400 mg  400 mg Oral Q6H PRN Ralene Muskrat B, MD   400 mg at 05/16/21 0829   melatonin tablet 5 mg  5 mg Oral  QHS Val Riles, MD   5 mg at 05/31/21 2140   methocarbamol (ROBAXIN) tablet 500 mg  500 mg Oral Q8H PRN Ralene Muskrat B, MD       midodrine (PROAMATINE) tablet 10 mg  10 mg Oral TID WC Loletha Grayer, MD   10 mg at 06/01/21 1605   mirtazapine (REMERON) tablet 15 mg  15 mg Oral QHS Sreenath, Sudheer B, MD   15 mg at 05/31/21 2140   ondansetron (ZOFRAN) injection 4 mg  4 mg Intravenous Q6H PRN Athena Masse, MD   4 mg at 06/01/21 0859   phenol (CHLORASEPTIC) mouth spray 1 spray  1 spray Mouth/Throat PRN Val Riles, MD   1 spray at 04/02/21 0155   polyethylene glycol (MIRALAX / GLYCOLAX) packet 17 g  17 g Oral Daily Val Riles, MD   17 g at 06/01/21 0857   traZODone (DESYREL) tablet 25 mg  25 mg Oral QHS PRN Ralene Muskrat B, MD   25 mg at 05/31/21 2140   [START ON 06/02/2021] zolendronic acid (ZOMETA) 4 mg in sodium chloride 0.9 % 100 mL IVPB  4 mg Intravenous Once Lloyd Huger, MD        OBJECTIVE: Vitals:   06/01/21 1145 06/01/21 1540  BP: 118/69 129/61  Pulse: 82 80  Resp: 17 17  Temp: 98.1 F (36.7 C) 97.6 F (36.4 C)  SpO2: 100% 100%     Body mass index is 13.83 kg/m.    ECOG FS:2 - Symptomatic, <50% confined to bed  General: Thin, no acute distress. Eyes: Pink conjunctiva, anicteric sclera. HEENT: Normocephalic, moist mucous membranes. Lungs: No audible wheezing or coughing. Heart: Regular rate and rhythm. Abdomen: Soft, nontender, no obvious distention. Musculoskeletal: No edema, cyanosis, or clubbing. Neuro: Alert, answering all questions appropriately. Cranial nerves grossly intact. Skin: No rashes or petechiae noted. Psych: Normal affect.  LAB RESULTS:  Lab Results  Component Value Date   NA 135 05/28/2021   K 4.9 05/28/2021   CL 106 05/28/2021   CO2 20 (L) 05/28/2021   GLUCOSE 118 (H) 05/28/2021   BUN 36 (H) 05/28/2021   CREATININE 0.53 (L) 05/28/2021   CALCIUM 6.9 (L) 05/28/2021   PROT 4.8 (L) 04/03/2021   ALBUMIN 2.0 (L) 04/04/2021    AST 35 04/03/2021   ALT 6 04/03/2021   ALKPHOS 504 (H) 04/03/2021   BILITOT 0.7 04/03/2021   GFRNONAA >60 05/28/2021   GFRAA >60 04/06/2020    Lab Results  Component Value Date   WBC 8.7 05/28/2021   NEUTROABS 5.2 05/10/2021   HGB 8.5 (L) 05/28/2021   HCT 27.6 (L)  05/28/2021   MCV 89.0 05/28/2021   PLT 206 05/28/2021     STUDIES: No results found.  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 CT scan results reviewed independently with widespread bony disease, but no obvious visceral lesions.  Patient's PSA continues to be greater than 3000, but he is only received 2 doses of Firmagon.  Proceed with his third dose of monthly 120 mg subcutaneous Firmagon and 4 mg IV Zometa tomorrow.  Repeat PSA has been ordered.  Follow-up will be based pending discharge from hospital. Anemia: Chronic and unchanged.  Patient's most recent hemoglobin is 8.5.  Given the extent of patient's bony disease, he may have marrow infiltration.  Treatment for his prostate cancer as above.  Continue to maintain hemoglobin greater than 8.0. Thrombocytopenia: Resolved. Hypocalcemia: Corrected calcium is near normal.  Proceed with Zometa tomorrow as ordered.   Substance abuse: Patient has a history of polysubstance abuse including cocaine.  Hypomagnesia: Resolved. Disposition: Difficult placement.  His next prostate cancer treatment will be due on or near June 30, 2021.  Will follow.  Lloyd Huger, MD   06/01/2021 4:15 PM

## 2021-06-01 NOTE — Progress Notes (Signed)
PT Cancellation Note  Patient Details Name: Ryan [name] MRN: [id number] DOB: 1960/11/27   Cancelled Treatment:    Reason Eval/Treat Not Completed: Patient declined, no reason specified. Chart reviewed. Pt appears lethargic upon entering. He pleasantly declined therapy stating, "not now, later please." Will follow up as time allows.    Patrina Levering PT, DPT 06/01/21 10:24 AM [phone]

## 2021-06-01 NOTE — Progress Notes (Signed)
Nutrition Follow-up  DOCUMENTATION CODES:  Severe malnutrition in context of chronic illness, Underweight  INTERVENTION:  Encourage PO intake, continue current diet as ordered Continue double protein portions with meals. Continue Ensure Enlive QID, each supplement provides 350 kcal and 20 grams of protein . Continue snacks TID New measured weight to be taken weekly  Pt would benefit from feeding tube placement and nocturnal feeds if agreeable.  NUTRITION DIAGNOSIS:  Severe Malnutrition (in the context of chronic illness) related to poor appetite as evidenced by severe muscle depletion, severe fat depletion.  - ongoing  GOAL:  Patient will meet greater than or equal to 90% of their needs  - making progress  MONITOR:  PO intake, Weight trends  REASON FOR ASSESSMENT:  Consult Assessment of nutrition requirement/status  ASSESSMENT:  60 y.o. male presented to ED for intermittent right lateral hip pain that is been ongoing for the past several weeks. Pt had surgery 10 months ago but reports he has not followed-up for pain. Imaging in ED concerning for marrow infiltrative process. PMH relevant for polysubstance abuse.  Found to have prostate cancer with widespread bony metastasis. Pt a difficult placement so cancer treatment was initiated inpatient. Receiving Zometea and Firmagon. First treatement was 04/02/21.   Pt continues to have stable intake of meals and is accepting on ensure supplements. Noted weight gain continues to trend up. Almost back to initial measured weight of ~120 lbs. Discussed in rounds, pt will likely be discharged to either a facility or homeless shelter today.   Average Meal Intake: 6/26-6/28: ~32% x 4 recorded meals (0-100%) 6/29-7/6: 48% intake x 6 recorded meals (15-90%) 7/7-7/12: 50% intake x 5 recorded meals (0-100%) 7/13-7/19: 30% intake x 2 recorded meals (30%) 7/20-7/26: 63% intake x 4 recorded meals (50-100%) 7/27-8/2: No meals recorded 8/3-8/9: 70%  intake x 2 recorded meals 8/10-8/15: 50% intake x 2 recorded meals 8/16-8/23: 50% intake x 3 recorded meals  Nutritionally Relevant Medications: Scheduled Meds:  dexamethasone  4 mg Oral Daily   feeding supplement  237 mL Oral TID PC & HS   ferrous sulfate  325 mg Oral Q breakfast   folic acid  1 mg Oral Daily   midodrine  10 mg Oral TID WC   mirtazapine  15 mg Oral QHS   polyethylene glycol  17 g Oral Daily   PRN Meds: bisacodyl, ondansetron  Labs Reviewed  Diet Order:   Diet Order             Diet regular Room service appropriate? Yes; Fluid consistency: Thin; Fluid restriction: Other (see comments)  Diet effective now                  EDUCATION NEEDS:  Education needs have been addressed  Skin:  Skin Assessment: Reviewed RN Assessment  Last BM:  8/21  Height:  Ht Readings from Last 1 Encounters:  05/25/21 '6\' 5"'$  (1.956 m)   Weight:  Wt Readings from Last 1 Encounters:  05/31/21 52.9 kg   Ideal Body Weight:  94.5 kg  BMI:  Body mass index is 13.83 kg/m.  Estimated Nutritional Needs:  Kcal:  2400-2600 kcal/d Protein:  130-150 g/d Fluid:  >2.5L/d  Ranell Patrick, RD, LDN Clinical Dietitian Pager on [place]

## 2021-06-01 NOTE — Progress Notes (Signed)
Mobility Specialist - Progress Note   06/01/21 1400  Mobility  Activity Refused mobility  Mobility performed by Mobility specialist    Pt sleeping in bed upon arrival, awakened by voice. "Every time I try to get some rest here y'all come bothering me!" Apologized about the disturbance, but kindly reminded pt that this is a busy hospital setting. Encouraged pt for mobility, pt declined. "I do want to work with you, but come back this evening." Will attempt session another date.    Kathee Delton Mobility Specialist 06/01/21, 2:59 PM

## 2021-06-01 NOTE — TOC Progression Note (Signed)
Transition of Care [facility]) - Progression Note    Patient Details  Name: Ryan [name] MRN: [id number] Date of Birth: May 27, 1961  [facility]) CM/SW Contact  Shelbie Hutching, RN Phone Number: 06/01/2021, 12:19 PM  Clinical Narrative:    Patient's Medicaid pending number MID [id number] n, case worker France Ravens [phone].  Ivin Booty with [facility] and [place] will look to see if they can accept patient they have several LTC beds available.     Expected Discharge Plan: [place] Barriers to Discharge: Inadequate or no insurance  Expected Discharge Plan and Services Expected Discharge Plan: [place]   Discharge Planning Services: CM Consult Post Acute Care Choice: [place] arrangements for the past 2 months: Apartment (lived in [place] before hospital)                 DME Arranged: N/A DME Agency: NA       HH Arranged: PT [place] Agency: [place] Date [place]: 05/19/21 Time [place]: [id number] Representative spoke with at [place]: [place] ([place]) Interventions    Readmission Risk Interventions Readmission Risk Prevention Plan 04/23/2021  Transportation Screening Complete  PCP or Specialist Appt within 3-5 Days Complete  HRI or [place] Complete  Social Work Consult for [place] Planning/Counseling Not Complete  SW consult not completed comments RNCM assigned to patient  Palliative Care Screening Complete  Medication Review Press photographer) Complete  Some recent data might be hidden

## 2021-06-02 ENCOUNTER — Emergency Department: Payer: Medicaid Other

## 2021-06-02 ENCOUNTER — Other Ambulatory Visit: Payer: Self-pay

## 2021-06-02 ENCOUNTER — Inpatient Hospital Stay
Admission: EM | Admit: 2021-06-02 | Discharge: 2021-08-31 | DRG: 562 | Disposition: A | Discharge status: Home Health | Payer: Medicaid Other | Attending: Internal Medicine | Admitting: Internal Medicine

## 2021-06-02 DIAGNOSIS — F419 Anxiety disorder, unspecified: Secondary | ICD-10-CM | POA: Diagnosis present

## 2021-06-02 DIAGNOSIS — Z20822 Contact with and (suspected) exposure to covid-19: Secondary | ICD-10-CM | POA: Diagnosis present

## 2021-06-02 DIAGNOSIS — C7951 Secondary malignant neoplasm of bone: Secondary | ICD-10-CM | POA: Diagnosis present

## 2021-06-02 DIAGNOSIS — G4700 Insomnia, unspecified: Secondary | ICD-10-CM | POA: Diagnosis present

## 2021-06-02 DIAGNOSIS — S52502A Unspecified fracture of the lower end of left radius, initial encounter for closed fracture: Secondary | ICD-10-CM | POA: Diagnosis present

## 2021-06-02 DIAGNOSIS — E43 Unspecified severe protein-calorie malnutrition: Secondary | ICD-10-CM | POA: Diagnosis present

## 2021-06-02 DIAGNOSIS — C61 Malignant neoplasm of prostate: Secondary | ICD-10-CM | POA: Diagnosis present

## 2021-06-02 DIAGNOSIS — S82842A Displaced bimalleolar fracture of left lower leg, initial encounter for closed fracture: Principal | ICD-10-CM | POA: Diagnosis present

## 2021-06-02 DIAGNOSIS — M8458XD Pathological fracture in neoplastic disease, other specified site, subsequent encounter for fracture with routine healing: Secondary | ICD-10-CM | POA: Diagnosis present

## 2021-06-02 DIAGNOSIS — Z79899 Other long term (current) drug therapy: Secondary | ICD-10-CM

## 2021-06-02 DIAGNOSIS — Z5901 Sheltered homelessness: Secondary | ICD-10-CM

## 2021-06-02 DIAGNOSIS — S52602A Unspecified fracture of lower end of left ulna, initial encounter for closed fracture: Secondary | ICD-10-CM | POA: Diagnosis present

## 2021-06-02 DIAGNOSIS — R64 Cachexia: Secondary | ICD-10-CM | POA: Diagnosis present

## 2021-06-02 DIAGNOSIS — Z66 Do not resuscitate: Secondary | ICD-10-CM | POA: Diagnosis present

## 2021-06-02 DIAGNOSIS — Z751 Person awaiting admission to adequate facility elsewhere: Secondary | ICD-10-CM

## 2021-06-02 DIAGNOSIS — F17200 Nicotine dependence, unspecified, uncomplicated: Secondary | ICD-10-CM | POA: Diagnosis present

## 2021-06-02 DIAGNOSIS — D638 Anemia in other chronic diseases classified elsewhere: Secondary | ICD-10-CM | POA: Diagnosis present

## 2021-06-02 DIAGNOSIS — Z8371 Family history of colonic polyps: Secondary | ICD-10-CM

## 2021-06-02 DIAGNOSIS — E871 Hypo-osmolality and hyponatremia: Secondary | ICD-10-CM | POA: Diagnosis present

## 2021-06-02 DIAGNOSIS — E875 Hyperkalemia: Secondary | ICD-10-CM | POA: Diagnosis present

## 2021-06-02 DIAGNOSIS — Z86010 Personal history of colonic polyps: Secondary | ICD-10-CM

## 2021-06-02 DIAGNOSIS — I959 Hypotension, unspecified: Secondary | ICD-10-CM | POA: Diagnosis present

## 2021-06-02 DIAGNOSIS — F32A Depression, unspecified: Secondary | ICD-10-CM | POA: Diagnosis present

## 2021-06-02 DIAGNOSIS — S82892A Other fracture of left lower leg, initial encounter for closed fracture: Secondary | ICD-10-CM | POA: Diagnosis present

## 2021-06-02 DIAGNOSIS — M8580 Other specified disorders of bone density and structure, unspecified site: Secondary | ICD-10-CM | POA: Diagnosis present

## 2021-06-02 DIAGNOSIS — W19XXXA Unspecified fall, initial encounter: Secondary | ICD-10-CM | POA: Diagnosis present

## 2021-06-02 DIAGNOSIS — S62102A Fracture of unspecified carpal bone, left wrist, initial encounter for closed fracture: Secondary | ICD-10-CM | POA: Diagnosis present

## 2021-06-02 DIAGNOSIS — Z800 Family history of malignant neoplasm of digestive organs: Secondary | ICD-10-CM

## 2021-06-02 DIAGNOSIS — Z681 Body mass index (BMI) 19 or less, adult: Secondary | ICD-10-CM

## 2021-06-02 LAB — CBC WITH DIFFERENTIAL/PLATELET
Abs Immature Granulocytes: 0.23 10*3/uL — ABNORMAL HIGH (ref 0.00–0.07)
Basophils Absolute: 0 10*3/uL (ref 0.0–0.1)
Basophils Relative: 0 %
Eosinophils Absolute: 0 10*3/uL (ref 0.0–0.5)
Eosinophils Relative: 0 %
HCT: 31.9 % — ABNORMAL LOW (ref 39.0–52.0)
Hemoglobin: 9.5 g/dL — ABNORMAL LOW (ref 13.0–17.0)
Immature Granulocytes: 2 %
Lymphocytes Relative: 11 %
Lymphs Abs: 1.1 10*3/uL (ref 0.7–4.0)
MCH: 28.3 pg (ref 26.0–34.0)
MCHC: 29.8 g/dL — ABNORMAL LOW (ref 30.0–36.0)
MCV: 94.9 fL (ref 80.0–100.0)
Monocytes Absolute: 0.8 10*3/uL (ref 0.1–1.0)
Monocytes Relative: 8 %
Neutro Abs: 8 10*3/uL — ABNORMAL HIGH (ref 1.7–7.7)
Neutrophils Relative %: 79 %
Platelets: 171 10*3/uL (ref 150–400)
RBC: 3.36 MIL/uL — ABNORMAL LOW (ref 4.22–5.81)
RDW: 22.8 % — ABNORMAL HIGH (ref 11.5–15.5)
Smear Review: NORMAL
WBC: 10.2 10*3/uL (ref 4.0–10.5)
nRBC: 1.1 % — ABNORMAL HIGH (ref 0.0–0.2)

## 2021-06-02 LAB — COMPREHENSIVE METABOLIC PANEL
ALT: 22 U/L (ref 0–44)
AST: 24 U/L (ref 15–41)
Albumin: 3.6 g/dL (ref 3.5–5.0)
Alkaline Phosphatase: 1902 U/L — ABNORMAL HIGH (ref 38–126)
Anion gap: 9 (ref 5–15)
BUN: 21 mg/dL — ABNORMAL HIGH (ref 6–20)
CO2: 21 mmol/L — ABNORMAL LOW (ref 22–32)
Calcium: 7 mg/dL — ABNORMAL LOW (ref 8.9–10.3)
Chloride: 108 mmol/L (ref 98–111)
Creatinine, Ser: 0.54 mg/dL — ABNORMAL LOW (ref 0.61–1.24)
GFR, Estimated: >60 mL/min (ref >60)
Glucose, Bld: 95 mg/dL (ref 70–99)
Potassium: 5.7 mmol/L — ABNORMAL HIGH (ref 3.5–5.1)
Sodium: 138 mmol/L (ref 135–145)
Total Bilirubin: 1 mg/dL (ref 0.3–1.2)
Total Protein: 6.5 g/dL (ref 6.5–8.1)

## 2021-06-02 LAB — URINALYSIS, COMPLETE (UACMP) WITH MICROSCOPIC
Bacteria, UA: NONE SEEN
Bilirubin Urine: NEGATIVE
Glucose, UA: NEGATIVE mg/dL
Hgb urine dipstick: NEGATIVE
Ketones, ur: NEGATIVE mg/dL
Leukocytes,Ua: NEGATIVE
Nitrite: NEGATIVE
Protein, ur: NEGATIVE mg/dL
Specific Gravity, Urine: 1.023 (ref 1.005–1.030)
pH: 7 (ref 5.0–8.0)

## 2021-06-02 LAB — PSA: Prostatic Specific Antigen: 433 ng/mL — ABNORMAL HIGH (ref 0.00–4.00)

## 2021-06-02 LAB — BASIC METABOLIC PANEL
Anion gap: 7 (ref 5–15)
BUN: 20 mg/dL (ref 6–20)
CO2: 23 mmol/L (ref 22–32)
Calcium: 6.6 mg/dL — ABNORMAL LOW (ref 8.9–10.3)
Chloride: 107 mmol/L (ref 98–111)
Creatinine, Ser: 0.52 mg/dL — ABNORMAL LOW (ref 0.61–1.24)
GFR, Estimated: >60 mL/min (ref >60)
Glucose, Bld: 102 mg/dL — ABNORMAL HIGH (ref 70–99)
Potassium: 5.9 mmol/L — ABNORMAL HIGH (ref 3.5–5.1)
Sodium: 137 mmol/L (ref 135–145)

## 2021-06-02 LAB — LIPASE, BLOOD: Lipase: 151 U/L — ABNORMAL HIGH (ref 11–51)

## 2021-06-02 MED ORDER — LACTATED RINGERS IV BOLUS
1000.0000 mL | Freq: Once | INTRAVENOUS | Status: AC
  Administered 2021-06-02: 1000 mL via INTRAVENOUS

## 2021-06-02 MED ORDER — SODIUM ZIRCONIUM CYCLOSILICATE 10 G PO PACK
10.0000 g | PACK | Freq: Once | ORAL | Status: AC
  Administered 2021-06-03: 10 g via ORAL
  Filled 2021-06-02: qty 1

## 2021-06-02 MED ORDER — HYDROXYZINE HCL 25 MG PO TABS
25.0000 mg | ORAL_TABLET | Freq: Three times a day (TID) | ORAL | 0 refills | Status: DC | PRN
Start: 2021-06-02 — End: 2021-08-31
  Filled 2021-06-02: qty 30, 10d supply, fill #0

## 2021-06-02 MED ORDER — DEXAMETHASONE 4 MG PO TABS
4.0000 mg | ORAL_TABLET | Freq: Every day | ORAL | 0 refills | Status: DC
  Filled 2021-06-02 (×2): qty 30, 30d supply, fill #0

## 2021-06-02 MED ORDER — MIRTAZAPINE 15 MG PO TABS
15.0000 mg | ORAL_TABLET | Freq: Every day | ORAL | 0 refills | Status: DC
  Filled 2021-06-02: qty 15, 15d supply, fill #0

## 2021-06-02 MED ORDER — MIDODRINE HCL 5 MG PO TABS
10.0000 mg | ORAL_TABLET | Freq: Three times a day (TID) | ORAL | 1 refills | Status: DC
  Filled 2021-06-02: qty 180, 30d supply, fill #0

## 2021-06-02 MED ORDER — OXYCODONE HCL 5 MG PO TABS
5.0000 mg | ORAL_TABLET | Freq: Once | ORAL | Status: AC
  Administered 2021-06-02: 5 mg via ORAL
  Filled 2021-06-02: qty 1

## 2021-06-02 MED ORDER — FUROSEMIDE 10 MG/ML IJ SOLN
40.0000 mg | Freq: Once | INTRAMUSCULAR | Status: AC
  Administered 2021-06-03: 40 mg via INTRAVENOUS
  Filled 2021-06-02: qty 4

## 2021-06-02 MED ORDER — LACTATED RINGERS IV BOLUS
1000.0000 mL | Freq: Once | INTRAVENOUS | Status: AC
  Administered 2021-06-03: 1000 mL via INTRAVENOUS

## 2021-06-02 MED ORDER — METHOCARBAMOL 500 MG PO TABS
500.0000 mg | ORAL_TABLET | Freq: Three times a day (TID) | ORAL | 0 refills | Status: DC | PRN
  Filled 2021-06-02: qty 15, 5d supply, fill #0

## 2021-06-02 NOTE — Progress Notes (Signed)
PT Cancellation Note  Patient Details Name: Ryan [name] MRN: [id number] DOB: July 19, 1961   Cancelled Treatment:    Reason Eval/Treat Not Completed: Other (comment). Pt chart reviewed. Pt received lying in bed. He refuses  therapy stating "ma'am I just laid down, can you come back later." PT informed pt she could not return later due to a full schedule. Pt agrees to trying tomorrow. Will continue to follow and see at later date.   Patrina Levering PT, DPT 06/02/21 11:00 AM [phone]

## 2021-06-02 NOTE — TOC Progression Note (Addendum)
Transition of Care [facility]) - Progression Note    Patient Details  Name: Ryan [name] MRN: [id number] Date of Birth: June 14, 1961  Transition of Care [facility]) CM/SW Contact  Ryan Pancoast, RN Phone Number: 06/02/2021, 12:57 PM  Clinical Narrative:    Spoke to patient cousin, Ryan [name] who expressed concerns about patient discharging to homeless shelter. Confirmed patient has an aunt who lives local who is upset about discharge but unable to accommodate patient into her home due to only having one room and having health concerns. Discussed in detail patient refusing therapy despite numerous requests and discussions regarding the importance of therapy. Discussed patients refusal to get out of bed for bathroom or eating. Confirmed patient does have pending medicaid and Disability application. Ryan [name] reported that he was concerned how patient would get medical assistance. Confirmed patient would be set up with med mgmt and given q28 day cancer injection prior to discharge.   RN CM confirmed with oncology patient was not scheduled to begin any chemotherapy or other treatments at this time. Will be followed as outpatient and treatment adjusted according to labs/symptoms. Per Oncology current treatment plan is control/palliative.    Expected Discharge Plan: [place] Barriers to Discharge: Inadequate or no insurance  Expected Discharge Plan and Services Expected Discharge Plan: [place]   Discharge Planning Services: CM Consult Post Acute Care Choice: [place] arrangements for the past 2 months: Apartment (lived in [place] before hospital) Expected Discharge Date: 06/02/21               DME Arranged: N/A DME Agency: NA       HH Arranged: PT HH Agency: [place] Date [place]: 05/19/21 Time [place]: [id number] Representative spoke with at [place]: [place] ([place]) Interventions     Readmission Risk Interventions Readmission Risk Prevention Plan 04/23/2021  Transportation Screening Complete  PCP or Specialist Appt within 3-5 Days Complete  HRI or [place] Complete  Social Work Consult for [place] Planning/Counseling Not Complete  SW consult not completed comments RNCM assigned to patient  Palliative Care Screening Complete  Medication Review Press photographer) Complete  Some recent data might be hidden

## 2021-06-02 NOTE — ED Triage Notes (Signed)
Pt comes ems from homeless shelter. Was d/c from the ER about an hour ago. Homeless shelter wouldn't accept him. Police took him to another homeless shelter. EMS was then called for a fall and pt has right wrist deformity. Family has been called and refusing to come pick him up.

## 2021-06-02 NOTE — TOC Transition Note (Signed)
Transition of Care [facility]) - CM/SW Discharge Note   Patient Details  Name: Ryan [name] MRN: [id number] Date of Birth: 12-15-1960  Transition of Care [facility]) CM/SW Contact:  Shelbie Hutching, RN Phone Number: 06/02/2021, 1:32 PM   Clinical Narrative:     Patient is medically cleared for discharge from the hospital.  RNCM has been unsuccessful with finding placement and patient still does not have disability and or Medicaid.   PT has been recommending HH, which cannot be set up since patient has no home.  RNCM provided patient with a donated walker.  RNCM also provided patient with 5 pairs of socks, 2 pairs of shorts, 2 pairs of underwear, 2 T shirts and a size 13 pair of shoes.  Mediations were obtained from Medication Management and delivered to the patient.    Patient is reporting that he will not go to the homeless shelter but then asking where he will go.  RNCM has provided patient with the option to go to the homeless shelter and transportation to the shelter.  RNCM will provide patient with a ride but he needs to provide an address to go to.    Patient's family contacts have been updated on plan for discharge today, Lorna Few and [name], aunt and cousin.  Family is concerned about patient discharging but they are unable to offer assistance or provide the patient a home to stay in.     Final next level of care: Homeless Shelter Barriers to Discharge: Barriers Resolved   Patient Goals and CMS Choice Patient states their goals for this hospitalization and ongoing recovery are:: Patient does not want to go to the homeless shelter CMS Medicare.gov Compare Post Acute Care list provided to:: Patient Choice offered to / list presented to : Patient  Discharge Placement                  Name of family member notified: Ceci[place] and [name] notified of discharge today Patient and family notified of of transfer: 06/02/21  Discharge Plan and Services   Discharge Planning Services: CM Consult Post  Acute Care Choice: Home Health          DME Arranged: [place] Rile rolling DME Agency: Other - Comment     Representative spoke with at DME Agency: TOC donated item HH Arranged: NA [place] Agency: NA Date [place]: 05/19/21 Time [place]: [id number] Representative spoke with at [place]: homeless shelter discharge cannot set up [address] ([place]) Interventions     Readmission Risk Interventions Readmission Risk Prevention Plan 04/23/2021  Transportation Screening Complete  PCP or Specialist Appt within 3-5 Days Complete  HRI or Home Care Consult Complete  Social Work Consult for [place] Planning/Counseling Not Complete  SW consult not completed comments RNCM assigned to patient  Palliative Care Screening Complete  Medication Review Press photographer) Complete  Some recent data might be hidden

## 2021-06-02 NOTE — Progress Notes (Signed)
Pt calling nurse, nurse tech phone and pressing call bell incessantly for graham crackers and other miscellaneous items. Endorses nausea. Educated pt that if nauseous he should be lying down eating and drinking constantly overnight. Encouraged pt to write down what he would like moving forward, so that staff can bring him his needed items and cluster care. Pt verbalizes understanding, but continues with same behavior.

## 2021-06-02 NOTE — Progress Notes (Signed)
Mobility Specialist - Progress Note   06/02/21 1300  Mobility  Activity Ambulated in room;Transferred to/from [facility]  Level of Assistance Standby assist, set-up cues, supervision of patient - no hands on  Assistive Device Front wheel walker  Distance Ambulated (ft) 15 ft  Mobility Out of bed for toileting;Ambulated with assistance in room  Mobility Response Tolerated well  Mobility performed by Mobility specialist  $Mobility charge 1 Mobility    Pt sleeping in bed upon arrival, agreeable to mobility with mod encouragement. Pt voiced need to void, had incontinent BM during transfer to [facility] Independent with seated peri-care. Pt fatigued, but does ambulate to door and back. Does report B foot pain. Pt had another incontinent BM while returning supine. Mobility assisted with cleaning bed. RN notified.    Kathee Delton Mobility Specialist 06/02/21, 1:06 PM

## 2021-06-02 NOTE — Discharge Summary (Signed)
Physician Discharge Summary  Ryan [name] [id number] DOB: 08-Feb-1961 DOA: 03/30/2021  PCP: Merryl Hacker, No  Admit date: 03/30/2021 Discharge date: 06/02/2021  Admitted From: Home Disposition: Shelter  Recommendations for Outpatient Follow-up:  Follow up with PCP in 1-2 weeks Please obtain BMP/CBC in one week Please follow-up with oncology as scheduled  Home Health: None Equipment/Devices: None  Discharge Condition: Stable CODE STATUS: DNR Diet recommendation: As tolerated  Brief/Interim Summary: Ryan [name] is a 60 y.o. male with medical history significant for history of polysubstance abuse including THC, cocaine, alcohol, remote history of tobacco use, history of right proximal femur inter trochanteric fracture status post right IM nail intertrochanteric fixation in 04/02/2021, does not follow-up with a PCP, presents to the emergency department for chief concerns of right hip pain.  Pain is chronic for several months, denies any trauma, difficulty ambulation due to pain. He also endorses an unintentional 35 pound weight loss in the last year, denies bright red blood per rectum and melena. patient was found to have L5 compression fracture, L3/4 severe stenosis.  He was also found to have widespread bony metastasis  from prostate cancer.     Assessment & Plan:  Metastatic prostate cancer --with mets to bone with associated L5 compression fracture, L3-L4 severe spinal stenosis with right lower extremity weakness. --Neurosurgery consulted- not a surgical candidate --pt has unrealistic expectation that he will be cured from cancer which has been discussed at length. --Oncology following for inpatient cancer related needs.  --Patient to be given first dose of chemotherapy today per [name] -follow-up as outpatient after discharge    Severe symptomatic anemia with folic acid deficiency -Stable, repeat labs outpatient with oncology/PCP as scheduled   Hypovolemic hyponatremia -- Improved  with IV and increased p.o. intake    non anion gap metabolic acidosis [id number] started po bicarb 650 TID x 4 days   Cancer cachexia with severe protein calorie malnutrition --Continue Decadron and Remeron   Left heel pain, resolved X rays of the foot negative for pathological fractures.   Hypotension: Likely secondary to above, continue midodrine, if intake improves could wean off midodrine per PCP/oncology recommendations in the next few weeks  Discharge Instructions  Discharge Instructions     Diet - low sodium heart healthy   Complete by: As directed    Increase activity slowly   Complete by: As directed       Allergies as of 06/02/2021   No Known Allergies      Medication List     STOP taking these medications    acetaminophen 325 MG tablet Commonly known as: TYLENOL   aspirin EC 325 MG tablet   diclofenac Sodium 1 % Gel Commonly known as: VOLTAREN   docusate sodium 100 MG capsule Commonly known as: COLACE   ferrous sulfate 325 (65 FE) MG tablet   folic acid 1 MG tablet Commonly known as: FOLVITE   multivitamin with minerals Tabs tablet   oxyCODONE 5 MG immediate release tablet Commonly known as: Oxy IR/ROXICODONE   traMADol 50 MG tablet Commonly known as: ULTRAM   Vitamin D (Ergocalciferol) 1.25 MG (50000 UNIT) Caps capsule Commonly known as: DRISDOL       TAKE these medications    dexamethasone 4 MG tablet Commonly known as: DECADRON Take 1 tablet (4 mg total) by mouth once daily. Start taking on: June 03, 2021   feeding supplement Liqd Take 237 mLs by mouth 3 (three) times daily between meals.   hydrOXYzine 25 MG tablet Commonly known  as: ATARAX/VISTARIL Take 1 tablet (25 mg total) by mouth 3 (three) times daily as needed for anxiety.   methocarbamol 500 MG tablet Commonly known as: ROBAXIN Take 1 tablet (500 mg total) by mouth every 8 (eight) hours as needed for muscle spasms.   midodrine 5 MG tablet Commonly known as:  PROAMATINE Take 2 tablets (10 mg total) by mouth 3 (three) times daily with meals.   mirtazapine 15 MG tablet Commonly known as: REMERON Take 1 tablet (15 mg total) by mouth once daily at bedtime.        No Known Allergies  Consultations: Oncology  Procedures/Studies: No results found.   Subjective: No acute issues or events overnight   Discharge Exam: Vitals:   06/02/21 0421 06/02/21 0900  BP: 102/62 111/71  Pulse: 88 91  Resp: 16 16  Temp: 98.3 F (36.8 C)   SpO2: 99% 100%   Vitals:   06/01/21 2043 06/02/21 0129 06/02/21 0421 06/02/21 0900  BP: (!) 129/50 101/67 102/62 111/71  Pulse: 79 84 88 91  Resp: '16 16 16 16  '$ Temp: 98.5 F (36.9 C) 97.8 F (36.6 C) 98.3 F (36.8 C)   TempSrc:      SpO2: 99% 99% 99% 100%  Weight:      Height:        General: Pt is alert, awake, not in acute distress Cardiovascular: RRR, S1/S2 +, no rubs, no gallops Respiratory: CTA bilaterally, no wheezing, no rhonchi Abdominal: Soft, NT, ND, bowel sounds + Extremities: no edema, no cyanosis    The results of significant diagnostics from this hospitalization (including imaging, microbiology, ancillary and laboratory) are listed below for reference.     Microbiology: No results found for this or any previous visit (from the past 240 hour(s)).   Labs: BNP (last 3 results) No results for input(s): BNP in the last 8760 hours. Basic Metabolic Panel: Recent Labs  Lab 05/28/21 1822  NA 135  K 4.9  CL 106  CO2 20*  GLUCOSE 118*  BUN 36*  CREATININE 0.53*  CALCIUM 6.9*  MG 1.7  PHOS 3.8   Liver Function Tests: No results for input(s): AST, ALT, ALKPHOS, BILITOT, PROT, ALBUMIN in the last 168 hours. No results for input(s): LIPASE, AMYLASE in the last 168 hours. No results for input(s): AMMONIA in the last 168 hours. CBC: Recent Labs  Lab 05/28/21 1822  WBC 8.7  HGB 8.5*  HCT 27.6*  MCV 89.0  PLT 206   Cardiac Enzymes: No results for input(s): CKTOTAL, CKMB,  CKMBINDEX, TROPONINI in the last 168 hours. BNP: Invalid input(s): POCBNP CBG: No results for input(s): GLUCAP in the last 168 hours. D-Dimer No results for input(s): DDIMER in the last 72 hours. Hgb A1c No results for input(s): HGBA1C in the last 72 hours. Lipid Profile No results for input(s): CHOL, HDL, LDLCALC, TRIG, CHOLHDL, LDLDIRECT in the last 72 hours. Thyroid function studies No results for input(s): TSH, T4TOTAL, T3FREE, THYROIDAB in the last 72 hours.  Invalid input(s): FREET3 Anemia work up No results for input(s): VITAMINB12, FOLATE, FERRITIN, TIBC, IRON, RETICCTPCT in the last 72 hours. Urinalysis    Component Value Date/Time   COLORURINE YELLOW (A) 05/02/2021 0913   APPEARANCEUR CLEAR (A) 05/02/2021 0913   LABSPEC 1.014 05/02/2021 0913   PHURINE 9.0 (H) 05/02/2021 0913   GLUCOSEU NEGATIVE 05/02/2021 0913   HGBUR NEGATIVE 05/02/2021 0913   BILIRUBINUR NEGATIVE 05/02/2021 0913   KETONESUR NEGATIVE 05/02/2021 0913   PROTEINUR NEGATIVE 05/02/2021 0913  NITRITE NEGATIVE 05/02/2021 0913   LEUKOCYTESUR NEGATIVE 05/02/2021 0913   Sepsis Labs Invalid input(s): PROCALCITONIN,  WBC,  LACTICIDVEN Microbiology No results found for this or any previous visit (from the past 240 hour(s)).   Time coordinating discharge: Over 30 minutes  SIGNED:   Little Ishikawa, DO [facility] 06/02/2021, 1:04 PM Pager   If 7PM-7AM, please contact night-coverage [email or web address]

## 2021-06-03 ENCOUNTER — Inpatient Hospital Stay: Payer: Medicaid Other

## 2021-06-03 ENCOUNTER — Other Ambulatory Visit: Payer: Self-pay

## 2021-06-03 DIAGNOSIS — D638 Anemia in other chronic diseases classified elsewhere: Secondary | ICD-10-CM

## 2021-06-03 DIAGNOSIS — S82892A Other fracture of left lower leg, initial encounter for closed fracture: Secondary | ICD-10-CM | POA: Diagnosis present

## 2021-06-03 DIAGNOSIS — R64 Cachexia: Secondary | ICD-10-CM | POA: Diagnosis present

## 2021-06-03 DIAGNOSIS — E43 Unspecified severe protein-calorie malnutrition: Secondary | ICD-10-CM | POA: Diagnosis present

## 2021-06-03 DIAGNOSIS — Z5901 Sheltered homelessness: Secondary | ICD-10-CM | POA: Diagnosis not present

## 2021-06-03 DIAGNOSIS — S62102A Fracture of unspecified carpal bone, left wrist, initial encounter for closed fracture: Secondary | ICD-10-CM

## 2021-06-03 DIAGNOSIS — W19XXXA Unspecified fall, initial encounter: Secondary | ICD-10-CM

## 2021-06-03 DIAGNOSIS — C7951 Secondary malignant neoplasm of bone: Secondary | ICD-10-CM | POA: Diagnosis present

## 2021-06-03 DIAGNOSIS — F17200 Nicotine dependence, unspecified, uncomplicated: Secondary | ICD-10-CM | POA: Diagnosis present

## 2021-06-03 DIAGNOSIS — S52602A Unspecified fracture of lower end of left ulna, initial encounter for closed fracture: Secondary | ICD-10-CM | POA: Diagnosis present

## 2021-06-03 DIAGNOSIS — S52502A Unspecified fracture of the lower end of left radius, initial encounter for closed fracture: Secondary | ICD-10-CM | POA: Diagnosis present

## 2021-06-03 DIAGNOSIS — F32A Depression, unspecified: Secondary | ICD-10-CM | POA: Diagnosis present

## 2021-06-03 DIAGNOSIS — G4700 Insomnia, unspecified: Secondary | ICD-10-CM | POA: Diagnosis present

## 2021-06-03 DIAGNOSIS — M8458XD Pathological fracture in neoplastic disease, other specified site, subsequent encounter for fracture with routine healing: Secondary | ICD-10-CM | POA: Diagnosis present

## 2021-06-03 DIAGNOSIS — C61 Malignant neoplasm of prostate: Secondary | ICD-10-CM

## 2021-06-03 DIAGNOSIS — Z66 Do not resuscitate: Secondary | ICD-10-CM | POA: Diagnosis present

## 2021-06-03 DIAGNOSIS — Z20822 Contact with and (suspected) exposure to covid-19: Secondary | ICD-10-CM | POA: Diagnosis present

## 2021-06-03 DIAGNOSIS — E875 Hyperkalemia: Secondary | ICD-10-CM | POA: Diagnosis present

## 2021-06-03 DIAGNOSIS — M8580 Other specified disorders of bone density and structure, unspecified site: Secondary | ICD-10-CM | POA: Diagnosis present

## 2021-06-03 DIAGNOSIS — Z751 Person awaiting admission to adequate facility elsewhere: Secondary | ICD-10-CM | POA: Diagnosis not present

## 2021-06-03 DIAGNOSIS — I959 Hypotension, unspecified: Secondary | ICD-10-CM | POA: Diagnosis present

## 2021-06-03 DIAGNOSIS — Z681 Body mass index (BMI) 19 or less, adult: Secondary | ICD-10-CM | POA: Diagnosis not present

## 2021-06-03 DIAGNOSIS — E871 Hypo-osmolality and hyponatremia: Secondary | ICD-10-CM | POA: Diagnosis present

## 2021-06-03 DIAGNOSIS — S82842A Displaced bimalleolar fracture of left lower leg, initial encounter for closed fracture: Secondary | ICD-10-CM | POA: Diagnosis not present

## 2021-06-03 DIAGNOSIS — Z800 Family history of malignant neoplasm of digestive organs: Secondary | ICD-10-CM | POA: Diagnosis not present

## 2021-06-03 LAB — BASIC METABOLIC PANEL
Anion gap: 10 (ref 5–15)
BUN: 21 mg/dL — ABNORMAL HIGH (ref 6–20)
CO2: 25 mmol/L (ref 22–32)
Calcium: 6.3 mg/dL — CL (ref 8.9–10.3)
Chloride: 102 mmol/L (ref 98–111)
Creatinine, Ser: 0.58 mg/dL — ABNORMAL LOW (ref 0.61–1.24)
GFR, Estimated: >60 mL/min (ref >60)
Glucose, Bld: 110 mg/dL — ABNORMAL HIGH (ref 70–99)
Potassium: 4.5 mmol/L (ref 3.5–5.1)
Sodium: 137 mmol/L (ref 135–145)

## 2021-06-03 LAB — PROTIME-INR
INR: 1.4 — ABNORMAL HIGH (ref 0.8–1.2)
Prothrombin Time: 17.1 seconds — ABNORMAL HIGH (ref 11.4–15.2)

## 2021-06-03 LAB — MAGNESIUM: Magnesium: 1.6 mg/dL — ABNORMAL LOW (ref 1.7–2.4)

## 2021-06-03 LAB — PHOSPHORUS: Phosphorus: 4.2 mg/dL (ref 2.5–4.6)

## 2021-06-03 LAB — RESP PANEL BY RT-PCR (FLU A&B, COVID) ARPGX2
Influenza A by PCR: NEGATIVE
Influenza B by PCR: NEGATIVE
SARS Coronavirus 2 by RT PCR: NEGATIVE

## 2021-06-03 LAB — APTT: aPTT: 30 seconds (ref 24–36)

## 2021-06-03 LAB — TYPE AND SCREEN
ABO/RH(D): O POS
Antibody Screen: NEGATIVE

## 2021-06-03 MED ORDER — OXYCODONE-ACETAMINOPHEN 5-325 MG PO TABS
1.0000 | ORAL_TABLET | ORAL | Status: DC | PRN
Start: 2021-06-03 — End: 2021-06-14
  Administered 2021-06-14: 1 via ORAL
  Filled 2021-06-03: qty 1

## 2021-06-03 MED ORDER — HYDROXYZINE HCL 25 MG PO TABS
25.0000 mg | ORAL_TABLET | Freq: Three times a day (TID) | ORAL | Status: DC | PRN
  Administered 2021-06-04 – 2021-08-23 (×5): 25 mg via ORAL
  Filled 2021-06-03 (×12): qty 1

## 2021-06-03 MED ORDER — ONDANSETRON HCL 4 MG/2ML IJ SOLN
4.0000 mg | Freq: Three times a day (TID) | INTRAMUSCULAR | Status: DC | PRN
  Administered 2021-06-03 – 2021-06-25 (×13): 4 mg via INTRAVENOUS
  Filled 2021-06-03 (×14): qty 2

## 2021-06-03 MED ORDER — MIRTAZAPINE 15 MG PO TABS
15.0000 mg | ORAL_TABLET | Freq: Every day | ORAL | Status: DC
  Administered 2021-06-03 – 2021-08-30 (×88): 15 mg via ORAL
  Filled 2021-06-03 (×90): qty 1

## 2021-06-03 MED ORDER — CALCIUM CARBONATE 1250 (500 CA) MG PO TABS
1250.0000 mg | ORAL_TABLET | Freq: Three times a day (TID) | ORAL | Status: DC
  Administered 2021-06-04 – 2021-08-31 (×245): 1250 mg via ORAL
  Filled 2021-06-03 (×269): qty 1

## 2021-06-03 MED ORDER — ENSURE ENLIVE PO LIQD
237.0000 mL | Freq: Two times a day (BID) | ORAL | Status: DC
  Administered 2021-06-04: 237 mL via ORAL

## 2021-06-03 MED ORDER — TRAZODONE HCL 50 MG PO TABS
25.0000 mg | ORAL_TABLET | Freq: Every evening | ORAL | Status: DC | PRN
  Administered 2021-06-05 – 2021-06-08 (×4): 25 mg via ORAL
  Filled 2021-06-03 (×4): qty 1

## 2021-06-03 MED ORDER — NICOTINE 21 MG/24HR TD PT24
21.0000 mg | MEDICATED_PATCH | Freq: Every day | TRANSDERMAL | Status: DC
  Administered 2021-06-15 – 2021-07-10 (×4): 21 mg via TRANSDERMAL
  Filled 2021-06-03 (×28): qty 1

## 2021-06-03 MED ORDER — ACETAMINOPHEN 160 MG/5ML PO SOLN
650.0000 mg | Freq: Four times a day (QID) | ORAL | Status: DC | PRN
  Filled 2021-06-03 (×2): qty 20.3

## 2021-06-03 MED ORDER — MORPHINE SULFATE (PF) 2 MG/ML IV SOLN
2.0000 mg | INTRAVENOUS | Status: DC | PRN
Start: 2021-06-03 — End: 2021-06-09

## 2021-06-03 MED ORDER — CALCIUM GLUCONATE-NACL 1-0.675 GM/50ML-% IV SOLN
1.0000 g | Freq: Once | INTRAVENOUS | Status: AC
  Administered 2021-06-03: 1000 mg via INTRAVENOUS
  Filled 2021-06-03: qty 50

## 2021-06-03 MED ORDER — DEXAMETHASONE 4 MG PO TABS
4.0000 mg | ORAL_TABLET | Freq: Every day | ORAL | Status: DC
  Administered 2021-06-03 – 2021-06-15 (×13): 4 mg via ORAL
  Filled 2021-06-03 (×13): qty 1

## 2021-06-03 MED ORDER — ENOXAPARIN SODIUM 40 MG/0.4ML IJ SOSY
40.0000 mg | PREFILLED_SYRINGE | INTRAMUSCULAR | Status: DC
  Administered 2021-06-04: 40 mg via SUBCUTANEOUS
  Filled 2021-06-03 (×16): qty 0.4

## 2021-06-03 MED ORDER — ONDANSETRON HCL 4 MG/2ML IJ SOLN
4.0000 mg | Freq: Once | INTRAMUSCULAR | Status: AC
  Administered 2021-06-03: 4 mg via INTRAVENOUS
  Filled 2021-06-03: qty 2

## 2021-06-03 NOTE — ED Notes (Signed)
Pt to ct scan.

## 2021-06-03 NOTE — ED Notes (Signed)
Pt sleeping. 

## 2021-06-03 NOTE — Evaluation (Signed)
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  Pt recently here for multiple months with difficult d/c planning, did ultimatley d/c to homeless shelter and was gone only a few hours before fall with L ankle and wrist fractures with return to the ED.  Clinical Impression  On arrival to pt he had covers up over his head, though he did quickly acknowledge and engage with this PT.  Initially refusing stating "I haven't even had my breakfast yet."  With some encouragement he was willing to participate.  Pt with new onset L distal tib/fib fx and L distal radius/ulna fractures making standing much less viable than it was just 24 hours ago before d/c.  He showed good effort with getting to EOB, though did need some assist.  Initial plan was to slide/squat-pivot to the recliner but it quickly became obvious that he could not safely do this and we worked on lateral scoots at EOB, pt able to get some push through R U&LEs but struggled to actually shift body w/o at least some assist.  With cuing he did make some improvement with needing less direct assist to scoot but ultimately he is very functionally limited and will likely be so for a while until he is able to use L LE for WBing.     Follow Up Recommendations SNF;Supervision/Assistance - 24 hour    Equipment Recommendations  Wheelchair (measurements PT)    Recommendations for Other Services       Precautions / Restrictions Precautions Precautions: Fall Spinal Brace Comments: has TLSO PRN for comfort, does not typically wear Restrictions Weight Bearing Restrictions: Yes LUE Weight Bearing: Non weight bearing (per discussion  with ED MD) LLE Weight Bearing: Non weight bearing      Mobility  Bed Mobility Overal bed mobility: Needs Assistance Bed Mobility: Supine to Sit Rolling: Min assist Sidelying to sit: Min assist Supine to sit: Min assist     General bed mobility comments: Pt showed good effort in getting to EOB, able to do most of the transition but ultimately needed light assist to elevate trunk and manage LEs as he had limited ability to use L UE    Transfers Overall transfer level: Needs assistance   Transfers: Lateral/Scoot Transfers          Lateral/Scoot Transfers: Mod assist General transfer comment: In preparation for likely need for w/c going forward introduced and performed lateral scoots at EOB.  Pt able to give some effort with scooting to R using R U&LEs, but was very limited/hesitant.  Unsafe to try squat-pivot to recliner.  Pt did manage to elevate bottom very minimally but needed direct assist from PT to actual scoot to the side.  Ambulation/Gait                Stairs            Wheelchair Mobility    Modified Rankin (Stroke Patients Only)       Balance Overall balance assessment: Needs assistance Sitting-balance support: No upper extremity supported;Feet supported Sitting balance-Leahy Scale: Good       Standing balance-Leahy Scale: Zero Standing balance comment: unable/unsafe to attempt at this time  Pertinent Vitals/Pain Pain Assessment: 0-10 Pain Score: 8  Pain Location: L ankle, less so in wrist    Home Living Family/patient expects to be discharged to:: Unsure Living Arrangements:  (currently homeless) Available Help at Discharge:  (limited family/firiend assistance)           Home Equipment: Walker - 2 wheels      Prior Function Level of Independence: Independent with assistive device(s)   Gait / Transfers Assistance Needed: Pt had been walking >100 ft with walker with relative ease on recent  admit  ADL's / Homemaking Assistance Needed: apparently was relatively independent a few months ago, but seems to be self limiting and needing considerable assist during his recent prolonged hospitalization        Hand Dominance        Extremity/Trunk Assessment   Upper Extremity Assessment Upper Extremity Assessment: Generalized weakness (no L UE resisted activity, R grossly 4-/5)    Lower Extremity Assessment Lower Extremity Assessment: Generalized weakness (R LE grossly 3+/5, L hip and knee grossly 3/5 with no ankle AROM/resisted acts)       Communication      Cognition Arousal/Alertness: Awake/alert Behavior During Therapy: WFL for tasks assessed/performed Overall Cognitive Status: Within Functional Limits for tasks assessed                                        General Comments General comments (skin integrity, edema, etc.): Pt had previously been able to mobilize/ambulate relatiely well when willing to do so, however with new U&LE fractures he will inevitably be limited with function and likely will require w/c    Exercises     Assessment/Plan    PT Assessment Patient needs continued PT services  PT Problem List Decreased strength;Decreased activity tolerance;Decreased range of motion;Decreased balance;Decreased mobility;Decreased safety awareness;Decreased knowledge of use of DME;Pain       PT Treatment Interventions DME instruction;Gait training;Stair training;Therapeutic activities;Functional mobility training;Therapeutic exercise;Balance training;Neuromuscular re-education;Patient/family education    PT Goals (Current goals can be found in the Care Plan section)  Acute Rehab PT Goals Patient Stated Goal: figure out what he can do and where he can go PT Goal Formulation: With patient Time For Goal Achievement: 06/17/21 Potential to Achieve Goals: Fair    Frequency Min 2X/week   Barriers to discharge Decreased caregiver support       Co-evaluation               AM-PAC PT "6 Clicks" Mobility  Outcome Measure Help needed turning from your back to your side while in a flat bed without using bedrails?: A Little Help needed moving from lying on your back to sitting on the side of a flat bed without using bedrails?: A Little Help needed moving to and from a bed to a chair (including a wheelchair)?: Total Help needed standing up from a chair using your arms (e.g., wheelchair or bedside chair)?: Total Help needed to walk in hospital room?: Total Help needed climbing 3-5 steps with a railing? : Total 6 Click Score: 10    End of Session Equipment Utilized During Treatment: Gait belt Activity Tolerance: Patient limited by pain;Patient limited by fatigue Patient left: in bed (in hallway next to nursing station, set up with breakfast tray)   PT Visit Diagnosis: Unsteadiness on feet (R26.81);Muscle weakness (generalized) (M62.81);Difficulty in walking, not elsewhere classified (R26.2);History of falling (Z91.81);Pain Pain - Right/Left: Left  Pain - part of body: Ankle and joints of foot    Time: [id number] PT Time Calculation (min) (ACUTE ONLY): 42 min   Charges:   PT Evaluation $PT Eval Low Complexity: 1 Low PT Treatments $Therapeutic Activity: 8-22 mins        Kreg Shropshire, DPT 06/03/2021, 10:48 AM

## 2021-06-03 NOTE — ED Provider Notes (Signed)
Ortho consulted, patient admitted to the hospitalist service.  Ortho request CT of the ankle   Lavonia Drafts, MD 06/03/21 1207

## 2021-06-03 NOTE — Progress Notes (Signed)
Called by ED staff and admitting hospitalist regarding patient. Chart and imaging including XR and CT reviewed. Full consult note to follow. Recommendations until then:  NWB on LLE for distal tibia/fibula fracture, NWB on LUE for distal radius fracture. Initial plan for non-operative management of both fractures.  PT/OT Pain control per admitting hospitalist team

## 2021-06-03 NOTE — ED Provider Notes (Signed)
Repeat K normal. Mild hypocalcemia supplemented IV. Patient is medically cleared for placement.   Alfred Levins, [place], MD 06/03/21 [phone]

## 2021-06-03 NOTE — Progress Notes (Signed)
Night coverage paged per patient request in regard to ordering home medication of trazadone for sleep. Nurse awaiting response.

## 2021-06-03 NOTE — ED Provider Notes (Addendum)
[facility]  ____________________________________________   Event Date/Time   First MD Initiated Contact with Patient 06/02/21 1937     (approximate)  I have reviewed the triage vital signs and the nursing notes.   HISTORY  Chief Complaint Wrist Pain    HPI Ryan [name] is a 60 y.o. male past medical history of metastatic prostate cancer with pathologic vertebral fractures, cachexia, anemia, who presents after a fall.  Patient was just discharged from the hospital today after prolonged admission for metastatic prostate cancer.  He was supposed to go to a homeless shelter but apparently there were issues when he was transported there.  We then had a fall and presents today with left wrist pain.  Denies hitting his head.  Also complains but is not sure exactly when this started.  He denies nausea vomiting abdominal pain fevers or chills.         Past Medical History:  Diagnosis Date   Adenomatous colon polyp    Anemia    Depression    Elevated LFTs     Patient Active Problem List   Diagnosis Date Noted   Hypotension 04/07/2021   Prostate cancer metastatic to bone ([place]) 04/06/2021   Anemia of chronic disease 03/30/2021   Thrombocytopenia ([place]) 03/30/2021   Thoracic compression fracture, closed, initial encounter ([place]) 03/30/2021   Pathologic fracture of thoracic vertebrae, initial encounter 03/30/2021   Malnourished ([place]) 03/30/2021   Protein-calorie malnutrition, severe ([place]) 03/30/2021   Cancer cachexia ([place]) 03/30/2021   Infestation by bed bug 03/30/2021   Fall    Transaminitis    Pain    Closed right hip fracture ([place]) 04/01/2020   Symptomatic anemia 04/01/2020   Hyponatremia 04/01/2020   Closed rib fracture 04/01/2020   Leukocytosis 04/01/2020   Rhabdomyolysis 04/01/2020   Dehydration 04/01/2020    Past Surgical History:  Procedure Laterality Date   FRACTURE SURGERY     INTRAMEDULLARY (IM) NAIL INTERTROCHANTERIC Right 04/02/2020    Procedure: INTRAMEDULLARY (IM) NAIL INTERTROCHANTRIC;  Surgeon: Corky Mull, MD;  Location: [facility] ORS;  Service: Orthopedics;  Laterality: Right;    Prior to Admission medications   Medication Sig Start Date End Date Taking? Authorizing Provider  dexamethasone (DECADRON) 4 MG tablet Take 1 tablet (4 mg total) by mouth once daily. 06/03/21   Little Ishikawa, MD  feeding supplement, ENSURE ENLIVE, (ENSURE ENLIVE) LIQD Take 237 mLs by mouth 3 (three) times daily between meals. 04/06/20   Lorella Nimrod, MD  hydrOXYzine (ATARAX/VISTARIL) 25 MG tablet Take 1 tablet (25 mg total) by mouth 3 (three) times daily as needed for anxiety. 06/02/21   Little Ishikawa, MD  methocarbamol (ROBAXIN) 500 MG tablet Take 1 tablet (500 mg total) by mouth every 8 (eight) hours as needed for muscle spasms. 06/02/21   Little Ishikawa, MD  midodrine (PROAMATINE) 5 MG tablet Take 2 tablets (10 mg total) by mouth 3 (three) times daily with meals. 06/02/21   Little Ishikawa, MD  mirtazapine (REMERON) 15 MG tablet Take 1 tablet (15 mg total) by mouth once daily at bedtime. 06/02/21   Little Ishikawa, MD    Allergies Patient has no known allergies.  Family History  Problem Relation Age of Onset   Heart disease Other    Colon polyps Cousin        maternal   Colon polyps Cousin    Colon polyps Maternal Aunt    Colon cancer Maternal Grandfather     Social History Social  History   Tobacco Use   Smoking status: Some Days   Smokeless tobacco: Never  Substance Use Topics   Alcohol use: Yes    Comment: 4 beers weekly   Drug use: No    Review of Systems   Review of Systems  Constitutional:  Positive for unexpected weight change. Negative for chills and fever.  Respiratory:  Negative for shortness of breath.   Cardiovascular:  Negative for chest pain.  Musculoskeletal:  Positive for arthralgias and myalgias.  All other systems reviewed and are negative.  Physical Exam Updated Vital Signs BP  104/77 (BP Location: Right Arm)   Pulse 88   Temp 98.1 F (36.7 C) (Oral)   Resp 20   Ht '6\' 5"'$  (1.956 m)   Wt 52 kg   SpO2 99%   BMI 13.59 kg/m   Physical Exam Vitals and nursing note reviewed.  Constitutional:      General: He is not in acute distress.    Appearance: Normal appearance.     Comments: Cachectic  HENT:     Head: Normocephalic and atraumatic.  Eyes:     General: No scleral icterus.    Conjunctiva/sclera: Conjunctivae normal.  Pulmonary:     Effort: Pulmonary effort is normal. No respiratory distress.     Breath sounds: Normal breath sounds. No wheezing.  Musculoskeletal:        General: Deformity and signs of injury present.     Cervical back: Normal range of motion.     Comments: Left wrist deformity, 2+ radial pulse, normal sensation and strength  Left ankle with swelling of the bilateral malleoli, 2+ DP pulse, able to move toes    Skin:    Coloration: Skin is not jaundiced or pale.  Neurological:     General: No focal deficit present.     Mental Status: He is alert and oriented to person, place, and time. Mental status is at baseline.  Psychiatric:        Mood and Affect: Mood normal.        Behavior: Behavior normal.     LABS (all labs ordered are listed, but only abnormal results are displayed)  Labs Reviewed  CBC WITH DIFFERENTIAL/PLATELET - Abnormal; Notable for the following components:      Result Value   RBC 3.36 (*)    Hemoglobin 9.5 (*)    HCT 31.9 (*)    MCHC 29.8 (*)    RDW 22.8 (*)    nRBC 1.1 (*)    Neutro Abs 8.0 (*)    Abs Immature Granulocytes 0.23 (*)    All other components within normal limits  COMPREHENSIVE METABOLIC PANEL - Abnormal; Notable for the following components:   Potassium 5.7 (*)    CO2 21 (*)    BUN 21 (*)    Creatinine, Ser 0.54 (*)    Calcium 7.0 (*)    Alkaline Phosphatase 1,902 (*)    All other components within normal limits  LIPASE, BLOOD - Abnormal; Notable for the following components:    Lipase 151 (*)    All other components within normal limits  URINALYSIS, COMPLETE (UACMP) WITH MICROSCOPIC - Abnormal; Notable for the following components:   Color, Urine YELLOW (*)    APPearance HAZY (*)    All other components within normal limits  BASIC METABOLIC PANEL - Abnormal; Notable for the following components:   Potassium 5.9 (*)    Glucose, Bld 102 (*)    Creatinine, Ser 0.52 (*)  Calcium 6.6 (*)    All other components within normal limits  BASIC METABOLIC PANEL   ____________________________________________  EKG  Normal sinus rhythm, normal intervals, normal axis, no acute ST or T wave changes ____________________________________________  RADIOLOGY I, Madelin Headings, personally viewed and evaluated these images (plain radiographs) as part of my medical decision making, as well as reviewing the written report by the radiologist.  ED MD interpretation: I reviewed the x-ray of the left wrist which shows a distal radial and ulnar fracture  I reviewed the x-ray of the left ankle which shows bimalleolar fracture, unclear the acuity    ____________________________________________   PROCEDURES  Procedure(s) performed (including Critical Care):  .Splint Application  Date/Time: 06/03/2021 12:26 AM Performed by: Rada Hay, MD Authorized by: Rada Hay, MD   Consent:    Consent obtained:  Verbal   Consent given by:  Patient   Alternatives discussed:  No treatment Universal protocol:    Patient identity confirmed:  Verbally with patient Pre-procedure details:    Distal neurologic exam:  Normal   Distal perfusion: distal pulses strong   Procedure details:    Location:  Wrist   Wrist location:  L wrist   Cast type:  Short arm   Splint type:  Volar short arm   Supplies:  Fiberglass Post-procedure details:    Distal neurologic exam:  Normal   Distal perfusion: distal pulses strong     Procedure completion:  Tolerated   Post-procedure  imaging: not applicable   .Splint Application  Date/Time: 06/03/2021 12:26 AM Performed by: Rada Hay, MD Authorized by: Rada Hay, MD   Consent:    Consent obtained:  Verbal   Consent given by:  Patient   Alternatives discussed:  No treatment Universal protocol:    Patient identity confirmed:  Verbally with patient Pre-procedure details:    Distal neurologic exam:  Normal   Distal perfusion: distal pulses strong   Procedure details:    Location:  Ankle   Ankle location:  L ankle   Strapping: no     Splint type:  Ankle stirrup   Supplies:  Fiberglass Post-procedure details:    Distal neurologic exam:  Normal   Distal perfusion: distal pulses strong     Procedure completion:  Tolerated   Post-procedure imaging: not applicable     ____________________________________________   INITIAL IMPRESSION / ASSESSMENT AND PLAN / ED COURSE     Patient is a 60 year old male who is chronically ill from metastatic prostate cancer who was just discharged after prolonged admission who now presents as a bounce back after a fall resulting in a left distal radius and ulnar fracture.  Patient also has a broken ankle, unclear what the timing of this was.  Labs were obtained in triage which shows a potassium of 5.7 with normal renal function.  He was given a liter of fluids and on repeat his potassium is 5.9.  Unclear why he is hyperkalemic.  His EKG has no changes.  Given his social situation, 2 fractures in the upper and lower extremity and this hyperkalemia I discussed admission with the hospitalist who strongly that admission was not warranted at this time, and would make disposition quite difficult for the patient.  Given this I will give another liter of fluids IV Lasix and Lokelma and repeat the potassium in 4 hours.  If it is still elevated then we will rediscuss admission as this would be unsafe for him to be discharged.  Regardless  he will need to stay overnight as he will need  to see case management in the a.m. for placement as he is not able to be discharged to the street given his condition.  Clinical Course as of 06/03/21 0025  Wed Jun 02, 2021  2205 Potassium(!): 5.9 [KM]    Clinical Course User Index [KM] Rada Hay, MD     ____________________________________________   FINAL CLINICAL IMPRESSION(S) / ED DIAGNOSES  Final diagnoses:  Hyperkalemia  Closed fracture of distal end of left radius, unspecified fracture morphology, initial encounter  Closed bimalleolar fracture of left ankle, initial encounter     ED Discharge Orders     None        Note:  This document was prepared using Dragon voice recognition software and may include unintentional dictation errors.    Rada Hay, MD 06/03/21 0014    Rada Hay, MD 06/03/21 [phone]

## 2021-06-03 NOTE — ED Notes (Signed)
This RN and Shan NT change dpt and pt bed sheets be fore pt was sent to the floor

## 2021-06-03 NOTE — H&P (Signed)
History and Physical    Ryan [name] [id number] DOB: [birth year] DOA: 06/02/2021  Referring MD/NP/PA:   PCP: Pcp, No   Chief Complaint: fall and left ankle and wrist pain  HPI: Ryan [name] is a 60 y.o. 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  Patient was recently hospitalized from 6/21-8/24 due to metastasized prostate cancer. Pt was seen by [name] of oncology. He got his last prostate cancer treatment yesterday. He was just discharged from the hospital yesterday. He was supposed to go to a homeless shelter but apparently there were issues when he was transported there. He sates that he he felt dizzy and fell accidentally. No LOC. He injured his left wrist and left ankle, causing severe pain in left ankle and left wrist.  The pain is constant, sharp, severe, nonradiating, aggravated by movement. Patient does not have chest pain, shortness breath, cough, fever or chills.  No nausea, vomiting, diarrhea or abdominal pain.  No symptoms of UTI.  No numbness in extremities.  No facial droop or slurred speech.  ED Course: pt was found to have WBC 10.2, lipase 151, electrolytes renal function okay, temperature normal, blood pressure 98/72, 120/76, heart rate in 98, 76, RR 22, oxygen saturation 95% on room air.  Patient is admitted to [place] bed as inpatient.  X-ray of left wrist: Fracture distal radius and ulna.  CT-left ankle: Impacted distal tibial and fibular fractures, with mild posterior displacement and angulation. No evidence of extension to the tibiotalar joint.   Diffuse osteopenia.  Diffuse soft tissue swelling of the ankle.    Review of Systems:   General: no fevers, chills, no body weight gain, has fatigue HEENT: no blurry vision, hearing changes or sore throat Respiratory: no dyspnea, coughing, wheezing CV: no chest pain, no  palpitations GI: no nausea, vomiting, abdominal pain, diarrhea, constipation GU: no dysuria, burning on urination, increased urinary frequency, hematuria  Ext: no leg edema Neuro: no unilateral weakness, numbness, or tingling, no vision change or hearing loss. Has fall. Has dizziness Skin: no rash, no skin tear. MSK: has left wrist and ankle pain Heme: No easy bruising.  Travel history: No recent long distant travel.  Allergy: No Known Allergies  Past Medical History:  Diagnosis Date   Adenomatous colon polyp    Anemia    Depression    Elevated LFTs     Past Surgical History:  Procedure Laterality Date   FRACTURE SURGERY     INTRAMEDULLARY (IM) NAIL INTERTROCHANTERIC Right 04/02/2020   Procedure: INTRAMEDULLARY (IM) NAIL INTERTROCHANTRIC;  Surgeon: Corky Mull, MD;  Location: [facility] ORS;  Service: Orthopedics;  Laterality: Right;    Social History:  reports that he has been smoking. He has never used smokeless tobacco. He reports current alcohol use. He reports that he does not use drugs.  Family History:  Family History  Problem Relation Age of Onset   Heart disease Other    Colon polyps Cousin        maternal   Colon polyps Cousin    Colon polyps Maternal Aunt    Colon cancer Maternal Grandfather      Prior to Admission medications   Medication Sig Start Date End Date Taking? Authorizing Provider  dexamethasone (DECADRON) 4 MG tablet Take 1 tablet (4 mg total) by mouth once daily. Patient not taking: Reported on 06/03/2021 06/03/21   Little Ishikawa, MD  feeding supplement, ENSURE ENLIVE, (ENSURE ENLIVE) LIQD Take 237 mLs by mouth 3 (three) times daily between meals. Patient not taking: Reported on 06/03/2021 04/06/20   Lorella Nimrod, MD  hydrOXYzine (ATARAX/VISTARIL) 25 MG tablet Take 1 tablet (25 mg total) by mouth 3 (three) times daily as needed for anxiety. Patient not taking: Reported on 06/03/2021 06/02/21   Little Ishikawa, MD  methocarbamol (ROBAXIN) 500  MG tablet Take 1 tablet (500 mg total) by mouth every 8 (eight) hours as needed for muscle spasms. Patient not taking: Reported on 06/03/2021 06/02/21   Little Ishikawa, MD  midodrine (PROAMATINE) 5 MG tablet Take 2 tablets (10 mg total) by mouth 3 (three) times daily with meals. Patient not taking: Reported on 06/03/2021 06/02/21   Little Ishikawa, MD  mirtazapine (REMERON) 15 MG tablet Take 1 tablet (15 mg total) by mouth once daily at bedtime. Patient not taking: Reported on 06/03/2021 06/02/21   Little Ishikawa, MD    Physical Exam: Vitals:   06/02/21 2245 06/03/21 0146 06/03/21 0309 06/03/21 0412  BP: 104/77 115/89 119/87 120/76  Pulse: 88 85 76 76  Resp: '20 19 17 '$ (!) 22  Temp:      TempSrc:      SpO2: 99% 99% 99% 99%  Weight:      Height:       General: Not in acute distress.  Cachectic looking HEENT:       Eyes: PERRL, EOMI, no scleral icterus.       ENT: No discharge from the ears and nose, no pharynx injection, no tonsillar enlargement.        Neck: No JVD, no bruit, no mass felt. Heme: No neck lymph node enlargement. Cardiac: S1/S2, RRR, No murmurs, No gallops or rubs. Respiratory: No rales, wheezing, rhonchi or rubs. GI: Soft, nondistended, nontender, no rebound pain, no organomegaly, BS present. GU: No hematuria Ext: No pitting leg edema bilaterally. 1+DP/PT pulse bilaterally. Musculoskeletal: has tenderness in left ankle and left wrist Neuro: Alert, oriented X3, cranial nerves II-XII grossly intact, moves all extremities  Psych: Patient is not psychotic, no suicidal or hemocidal ideation.  Labs on Admission: I have personally reviewed following labs and imaging studies  CBC: Recent Labs  Lab 05/28/21 1822 06/02/21 1757  WBC 8.7 10.2  NEUTROABS  --  8.0*  HGB 8.5* 9.5*  HCT 27.6* 31.9*  MCV 89.0 94.9  PLT 206 [id number]   Basic Metabolic Panel: Recent Labs  Lab 05/28/21 1822 06/02/21 1200 06/02/21 1757 06/02/21 2132 06/03/21 0417  NA 135  --  138  137 137  K 4.9  --  5.7* 5.9* 4.5  CL 106  --  108 107 102  CO2 20*  --  21* 23 25  GLUCOSE 118*  --  95 102* 110*  BUN 36*  --  21* 20 21*  CREATININE 0.53*  --  0.54* 0.52* 0.58*  CALCIUM 6.9*  --  7.0* 6.6* 6.3*  MG 1.7 1.6*  --   --   --   PHOS 3.8 4.2  --   --   --    GFR: Estimated Creatinine Clearance: 73.1 mL/min (A) (by C-G formula based on SCr of 0.58 mg/dL (L)). Liver Function Tests: Recent Labs  Lab 06/02/21 1757  AST 24  ALT 22  ALKPHOS 1,902*  BILITOT 1.0  PROT 6.5  ALBUMIN 3.6   Recent Labs  Lab 06/02/21 1757  LIPASE 151*   No results for input(s): AMMONIA in the last  168 hours. Coagulation Profile: No results for input(s): INR, PROTIME in the last 168 hours. Cardiac Enzymes: No results for input(s): CKTOTAL, CKMB, CKMBINDEX, TROPONINI in the last 168 hours. BNP (last 3 results) No results for input(s): PROBNP in the last 8760 hours. HbA1C: No results for input(s): HGBA1C in the last 72 hours. CBG: No results for input(s): GLUCAP in the last 168 hours. Lipid Profile: No results for input(s): CHOL, HDL, LDLCALC, TRIG, CHOLHDL, LDLDIRECT in the last 72 hours. Thyroid Function Tests: No results for input(s): TSH, T4TOTAL, FREET4, T3FREE, THYROIDAB in the last 72 hours. Anemia Panel: No results for input(s): VITAMINB12, FOLATE, FERRITIN, TIBC, IRON, RETICCTPCT in the last 72 hours. Urine analysis:    Component Value Date/Time   COLORURINE YELLOW (A) 06/02/2021 2033   APPEARANCEUR HAZY (A) 06/02/2021 2033   LABSPEC 1.023 06/02/2021 2033   PHURINE 7.0 06/02/2021 2033   GLUCOSEU NEGATIVE 06/02/2021 2033   HGBUR NEGATIVE 06/02/2021 2033   BILIRUBINUR NEGATIVE 06/02/2021 2033   KETONESUR NEGATIVE 06/02/2021 2033   PROTEINUR NEGATIVE 06/02/2021 2033   NITRITE NEGATIVE 06/02/2021 2033   LEUKOCYTESUR NEGATIVE 06/02/2021 2033   Sepsis Labs: '@LABRCNTIP'$ (procalcitonin:4,lacticidven:4) ) Recent Results (from the past 240 hour(s))  Resp Panel by RT-PCR  (Flu A&B, Covid) Nasopharyngeal Swab     Status: None   Collection Time: 06/03/21  5:36 AM   Specimen: Nasopharyngeal Swab; Nasopharyngeal(NP) swabs in vial transport medium  Result Value Ref Range Status   SARS Coronavirus 2 by RT PCR NEGATIVE NEGATIVE Final    Comment: (NOTE) SARS-CoV-2 target nucleic acids are NOT DETECTED.  The SARS-CoV-2 RNA is generally detectable in upper respiratory specimens during the acute phase of infection. The lowest concentration of SARS-CoV-2 viral copies this assay can detect is 138 copies/mL. A negative result does not preclude SARS-Cov-2 infection and should not be used as the sole basis for treatment or other patient management decisions. A negative result may occur with  improper specimen collection/handling, submission of specimen other than nasopharyngeal swab, presence of viral mutation(s) within the areas targeted by this assay, and inadequate number of viral copies(<138 copies/mL). A negative result must be combined with clinical observations, patient history, and epidemiological information. The expected result is Negative.  Fact Sheet for Patients:  EntrepreneurPulse.com.au  Fact Sheet for Healthcare Providers:  IncredibleEmployment.be  This test is no t yet approved or cleared by the [place] FDA and  has been authorized for detection and/or diagnosis of SARS-CoV-2 by FDA under an Emergency Use Authorization (EUA). This EUA will remain  in effect (meaning this test can be used) for the duration of the COVID-19 declaration under Section 564(b)(1) of the Act, 21 U.S.C.section 360bbb-3(b)(1), unless the authorization is terminated  or revoked sooner.       Influenza A by PCR NEGATIVE NEGATIVE Final   Influenza B by PCR NEGATIVE NEGATIVE Final    Comment: (NOTE) The Xpert Xpress SARS-CoV-2/FLU/RSV plus assay is intended as an aid in the diagnosis of influenza from Nasopharyngeal swab specimens  and should not be used as a sole basis for treatment. Nasal washings and aspirates are unacceptable for Xpert Xpress SARS-CoV-2/FLU/RSV testing.  Fact Sheet for Patients: EntrepreneurPulse.com.au  Fact Sheet for Healthcare Providers: IncredibleEmployment.be  This test is not yet approved or cleared by the [place] FDA and has been authorized for detection and/or diagnosis of SARS-CoV-2 by FDA under an Emergency Use Authorization (EUA). This EUA will remain in effect (meaning this test can be used) for the duration of the COVID-19  declaration under Section 564(b)(1) of the Act, 21 U.S.C. section 360bbb-3(b)(1), unless the authorization is terminated or revoked.  Performed at [facility], [address] [place],  [address]      Radiological Exams on Admission: DG Wrist Complete Left  Result Date: 06/02/2021 CLINICAL DATA:  Fall EXAM: LEFT WRIST - COMPLETE 3+ VIEW COMPARISON:  None. FINDINGS: Acute fracture distal radius and ulna. Impacted fracture on the dorsal surface of the distal radius does not appear to extend into the joint. Nondisplaced fracture distal ulna. Wrist joint appears normal. No carpal fracture. IMPRESSION: Fracture distal radius and ulna. Electronically Signed   By: Franchot Gallo M.D.   On: 06/02/2021 17:15   DG Ankle 2 Views Left  Result Date: 06/02/2021 CLINICAL DATA:  Fall EXAM: LEFT ANKLE - 2 VIEW COMPARISON:  04/23/2021 FINDINGS: Fracture noted in the distal tibial and fibular metaphyses, appears subacute, but was not present on prior study. No subluxation or dislocation. Diffuse soft tissue swelling. IMPRESSION: Distal tibial and fibular metaphyseal fractures, acute versus subacute. Electronically Signed   By: Rolm Baptise M.D.   On: 06/02/2021 20:50   CT Ankle Left Wo Contrast  Result Date: 06/03/2021 CLINICAL DATA:  Ankle pain, no prior imaging EXAM: CT OF THE LEFT ANKLE WITHOUT CONTRAST TECHNIQUE:  Multidetector CT imaging of the left ankle was performed according to the standard protocol. Multiplanar CT image reconstructions were also generated. COMPARISON:  Foot radiograph 04/23/2021 FINDINGS: Bones/Joint/Cartilage Diffuse osteopenia. There is an impacted distal tibial metaphyseal fracture with mild posterior displacement and angulation. This does not extend to the tibiotalar joint. There is also an impacted distal fibular fracture with minimal posterior displacement and angulation. These fractures are transversely oriented. Ligaments Suboptimally assessed by CT. Muscles and Tendons The significant muscle atrophy. There is no evidence of tendon entrapment. Soft tissues Diffuse soft tissue swelling of the ankle. IMPRESSION: Impacted distal tibial and fibular fractures, with mild posterior displacement and angulation. No evidence of extension to the tibiotalar joint. Diffuse osteopenia.  Diffuse soft tissue swelling of the ankle. Electronically Signed   By: Maurine Simmering M.D.   On: 06/03/2021 13:09     EKG: I have personally reviewed.  Sinus rhythm, QTC 435, low voltage  Assessment/Plan Principal Problem:   Closed left ankle fracture Active Problems:   Fall   Anemia of chronic disease   Protein-calorie malnutrition, severe (HCC)   Cancer cachexia (HCC)   Prostate cancer metastatic to bone (HCC)   Left wrist fracture, closed, initial encounter   Hypocalcemia   Hyperkalemia  Closed left ankle fracture and left wrist fracture, closed, initial encounter: consulted [name] of ortho, given patient's metastasized prostate cancer, recommended conservative treatment.  -Admitted to MedSurg bed as inpatient -Pain control: As needed morphine, Percocet, Tylenol -PT/OT  Fall -PT/OT -Follow-up CT of head  Anemia of chronic disease: Hgb stable. Hgb 9.5 (8/5 on 05/28/21) -f/u with CBC  Protein-calorie malnutrition, severe ([place]) -Ensure  Cancer cachexia and depression with anxiety -As needed  hydroxyzine, Remeron  Prostate cancer metastatic to bone [facility]): Consulted [name].  Per [name], Pt was seen in hospital in previous [place]. He got his last prostate cancer treatment yesterday, is not due for another 4 weeks; already has out patient follow up scheduled.    Hypocalcemia: Ca 6.3 -repleted  -f/u with BMP -start calcium carbonate supplement -Check vitamin D 1,25 level  Hyperkalemia: K 5.9 -10 g of Lokemam --> K= 4.5 after treatment    DVT ppx: SQ Lovenox  Code Status: Full code Family Communication: not done, no family member is at bed side.    Disposition Plan:  Anticipate discharge back to previous environment Consults called:  [name] of ortho Admission status and Level of care: Med-Surg:    Med-surg bed  as inpt      Date of Service 06/03/2021    Ivor Costa [facility]   If 7PM-7AM, please contact night-coverage [email or web address] 06/03/2021, 2:19 PM

## 2021-06-03 NOTE — ED Notes (Signed)
Patient given two packs of graham crackers

## 2021-06-04 DIAGNOSIS — S52502A Unspecified fracture of the lower end of left radius, initial encounter for closed fracture: Secondary | ICD-10-CM

## 2021-06-04 LAB — CBC
HCT: 25.7 % — ABNORMAL LOW (ref 39.0–52.0)
Hemoglobin: 8 g/dL — ABNORMAL LOW (ref 13.0–17.0)
MCH: 28.3 pg (ref 26.0–34.0)
MCHC: 31.1 g/dL (ref 30.0–36.0)
MCV: 90.8 fL (ref 80.0–100.0)
Platelets: 155 10*3/uL (ref 150–400)
RBC: 2.83 MIL/uL — ABNORMAL LOW (ref 4.22–5.81)
RDW: 21.3 % — ABNORMAL HIGH (ref 11.5–15.5)
WBC: 7.4 10*3/uL (ref 4.0–10.5)
nRBC: 0.3 % — ABNORMAL HIGH (ref 0.0–0.2)

## 2021-06-04 LAB — HEPATIC FUNCTION PANEL
ALT: 17 U/L (ref 0–44)
AST: 16 U/L (ref 15–41)
Albumin: 3.1 g/dL — ABNORMAL LOW (ref 3.5–5.0)
Alkaline Phosphatase: 1475 U/L — ABNORMAL HIGH (ref 38–126)
Bilirubin, Direct: <0.1 mg/dL (ref 0.0–0.2)
Total Bilirubin: 0.8 mg/dL (ref 0.3–1.2)
Total Protein: 5.7 g/dL — ABNORMAL LOW (ref 6.5–8.1)

## 2021-06-04 LAB — BASIC METABOLIC PANEL
Anion gap: 8 (ref 5–15)
BUN: 29 mg/dL — ABNORMAL HIGH (ref 6–20)
CO2: 21 mmol/L — ABNORMAL LOW (ref 22–32)
Calcium: 5.6 mg/dL — CL (ref 8.9–10.3)
Chloride: 106 mmol/L (ref 98–111)
Creatinine, Ser: 0.51 mg/dL — ABNORMAL LOW (ref 0.61–1.24)
GFR, Estimated: >60 mL/min (ref >60)
Glucose, Bld: 103 mg/dL — ABNORMAL HIGH (ref 70–99)
Potassium: 4.9 mmol/L (ref 3.5–5.1)
Sodium: 135 mmol/L (ref 135–145)

## 2021-06-04 LAB — HIV ANTIBODY (ROUTINE TESTING W REFLEX): HIV Screen 4th Generation wRfx: NONREACTIVE

## 2021-06-04 MED ORDER — SODIUM CHLORIDE 0.9 % IV SOLN
INTRAVENOUS | Status: DC | PRN
  Administered 2021-06-04: 250 mL via INTRAVENOUS

## 2021-06-04 MED ORDER — ENSURE ENLIVE PO LIQD
237.0000 mL | Freq: Four times a day (QID) | ORAL | Status: DC
  Administered 2021-06-04 – 2021-06-14 (×38): 237 mL via ORAL
  Administered 2021-06-15: 1 mL via ORAL
  Administered 2021-06-15 – 2021-07-27 (×114): 237 mL via ORAL

## 2021-06-04 MED ORDER — CALCIUM GLUCONATE-NACL 2-0.675 GM/100ML-% IV SOLN
2.0000 g | Freq: Once | INTRAVENOUS | Status: AC
  Administered 2021-06-04: 2000 mg via INTRAVENOUS
  Filled 2021-06-04: qty 100

## 2021-06-04 MED ORDER — ADULT MULTIVITAMIN W/MINERALS CH
1.0000 | ORAL_TABLET | Freq: Every day | ORAL | Status: DC
  Administered 2021-06-04 – 2021-08-31 (×88): 1 via ORAL
  Filled 2021-06-04 (×88): qty 1

## 2021-06-04 NOTE — TOC Progression Note (Addendum)
Transition of Care [facility]) - Progression Note    Patient Details  Name: Ryan [name] MRN: [id number] Date of Birth: 1961/05/10  Transition of Care [facility]) CM/SW [place], RN Phone Number: 06/04/2021, 1:52 PM  Clinical Narrative:    Sent bed search to all facilities in the [place], Sent message to follow up on Medicaid and disability application awaiting a response, Awaiting bed offers, will do an LOG,   Update, Still in processing with DDS not yet approved, original application [id number]        Expected Discharge Plan and Services                                                 Social Determinants of Health (SDOH) Interventions    Readmission Risk Interventions Readmission Risk Prevention Plan 04/23/2021  Transportation Screening Complete  PCP or Specialist Appt within 3-5 Days Complete  HRI or [place] Complete  Social Work Consult for [place] Planning/Counseling Not Complete  SW consult not completed comments RNCM assigned to patient  Palliative Care Screening Complete  Medication Review Press photographer) Complete  Some recent data might be hidden

## 2021-06-04 NOTE — Evaluation (Signed)
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  Pt recently here for multiple months with difficult d/c planning, did ultimatley d/c to homeless shelter and was gone only a few hours before fall with L ankle and wrist fractures with return to the ED.   Clinical Impression   Mr. Heglar had a mechanical fall on the sidewalk shortly after hospital DC, and now presents with new-onset L distal tib/fib fx and L distal radius/ulna fractures. He was able to transition supine<sit with SUPV, ultimately able to reposition LLE himself, and maintained EOB sitting with good balance for ~10 minutes. He was unable, however, to lift bottom off bed to attempt standing (w/ WB on R LE only). He was able to perform bridging in bed, allowing him to assist in LB dressing after therapist had threaded LLE through undergarment. Pt used RUE to raise LUE for UB dressing with MinA. Pt able to feed self using RUE only after set-up. Following cueing, pt able to shift position in bed and to scoot towards head of bed in supine, with CGA. Provided educ re: WB precautions. Pt will need additional reminders, as well as introduction of AD/AE to assist in fxl mobility and independence. Pt will benefit from ongoing OT while hospitalized; recommend DC to SNF, given pt's considerably increased needs following UE and LE fractures and lack of support from family/friends.    Follow Up Recommendations  SNF    Equipment Recommendations  None recommended by OT    Recommendations for Other Services       Precautions / Restrictions Precautions Precautions: Fall Required Braces or Orthoses: Other Brace Spinal  Brace Comments: pt no longer using TLSO brace Other Brace: pt no longer using LLE post-op shoe Restrictions Weight Bearing Restrictions: Yes LUE Weight Bearing: Non weight bearing LLE Weight Bearing: Non weight bearing      Mobility Bed Mobility Overal bed mobility: Needs Assistance Bed Mobility: Supine to Sit     Supine to sit: Supervision Sit to supine: Supervision   General bed mobility comments: Pt able to lower/elevate L LE with increased time/effort, but no physical assistance required    Transfers Overall transfer level: Needs assistance Equipment used: Rolling walker (2 wheeled) Transfers: Lateral/Scoot Transfers          Lateral/Scoot Transfers: Min assist General transfer comment: Pt unable to lift buttocks off bed from seated position while bearing weight on RLE    Balance Overall balance assessment: Needs assistance Sitting-balance support: No upper extremity supported;Feet supported Sitting balance-Leahy Scale: Good       Standing balance-Leahy Scale: Zero Standing balance comment: unable/unsafe to attempt at this time                           ADL either performed or assessed with clinical judgement   ADL Overall ADL's : Needs assistance/impaired Eating/Feeding: Set up;Supervision/ safety               Upper Body Dressing : Minimal assistance Upper Body Dressing Details (indicate cue type and reason): donning/doffing hospital gown Lower Body Dressing: Minimal assistance Lower Body Dressing Details (indicate cue type and reason): donning underwear. Requires Min A  for threading LLE, pt able to pull up IND from supine position, using bridging                     Vision         Perception     Praxis      Pertinent Vitals/Pain Pain Assessment: No/denies pain     Hand Dominance Right   Extremity/Trunk Assessment Upper Extremity Assessment Upper Extremity Assessment: Generalized weakness;LUE deficits/detail LUE  Deficits / Details: s/p fx, distal radius and ulna; NWB order   Lower Extremity Assessment Lower Extremity Assessment: Generalized weakness;LLE deficits/detail LLE Deficits / Details: s/p fx, distal tibia and fibia, NWB order       Communication Communication Communication: No difficulties   Cognition Arousal/Alertness: Awake/alert Behavior During Therapy: WFL for tasks assessed/performed Overall Cognitive Status: Within Functional Limits for tasks assessed                                     General Comments       Exercises Other Exercises Other Exercises: UB and LB dressing, bed mobility, transfers, educ re: NWB precautions   Shoulder Instructions      Home Living Family/patient expects to be discharged to:: Unsure Living Arrangements:  (pt homeless) Available Help at Discharge:  (pt has no family/friends who could assist him with care needs post DC) Type of Home: Homeless                       Home Equipment: Environmental consultant - 2 wheels          Prior Functioning/Environment Level of Independence: Needs assistance  Gait / Transfers Assistance Needed: uses RW for ambulating limited distances ADL's / Homemaking Assistance Needed: Pt has needed assistance with toileting, grooming, dressing, fxl mobility throught recent long hospitalization            OT Problem List: Decreased strength;Decreased knowledge of use of DME or AE;Decreased range of motion;Decreased coordination;Decreased activity tolerance;Impaired balance (sitting and/or standing);Pain;Impaired sensation      OT Treatment/Interventions: Self-care/ADL training;Therapeutic exercise;Patient/family education;Balance training;Energy conservation;DME and/or AE instruction;Therapeutic activities    OT Goals(Current goals can be found in the care plan section) Acute Rehab OT Goals Patient Stated Goal: figure out what he can do and where he can go OT Goal Formulation: With patient Time For Goal  Achievement: 06/07/21 Potential to Achieve Goals: Good ADL Goals Pt Will Perform Grooming: with modified independence;standing Pt Will Perform Upper Body Dressing: with supervision;with set-up;sitting Pt Will Perform Lower Body Dressing: with supervision;sitting/lateral leans Pt Will Transfer to Toilet: with modified independence;stand pivot transfer;regular height toilet  OT Frequency: Min 1X/week   Barriers to D/C: Decreased caregiver support  Pt reports having no one who could assist him regularly; is currently homeless       Co-evaluation              AM-PAC OT "6 Clicks" Daily Activity     Outcome Measure Help from another person eating meals?: None Help from another person taking care of personal grooming?: A Little Help from another person toileting, which includes using toliet, bedpan, or urinal?: A Lot Help from another person bathing (including washing, rinsing, drying)?: A Lot Help from another person to put on and taking off regular upper body clothing?: A Little Help from another person to put on and taking off regular lower body clothing?: A Little  6 Click Score: 17   End of Session    Activity Tolerance: Patient tolerated treatment well Patient left: in bed;with call bell/phone within reach;with bed alarm set  OT Visit Diagnosis: Unsteadiness on feet (R26.81);Muscle weakness (generalized) (M62.81);Adult, failure to thrive (R62.7)                Time: [id number] OT Time Calculation (min): 40 min Charges:  OT General Charges $OT Visit: 1 Visit OT Evaluation $OT Eval Moderate Complexity: 1 Mod OT Treatments $Self Care/Home Management : 38-52 mins Josiah Lobo, PhD, MS, OTR/L 06/04/21, 3:03 PM

## 2021-06-04 NOTE — Plan of Care (Signed)
  Problem: Physical Regulation: Goal: Postoperative complications will be avoided or minimized Outcome: Not Applicable   Problem: Pain Management: Goal: Pain level will decrease with appropriate interventions Outcome: Progressing   Problem: Skin Integrity: Goal: Will show signs of wound healing Outcome: Not Applicable

## 2021-06-04 NOTE — Progress Notes (Signed)
Initial Nutrition Assessment  DOCUMENTATION CODES:  Severe malnutrition in context of chronic illness, Underweight  INTERVENTION:  Increase Ensure Enlive/Plus to QID from BID.  Add snacks TID - RD to order.  Add double protein to meals.  Add MVI with minerals daily.  Encourage PO intake.  NUTRITION DIAGNOSIS:  Severe Malnutrition related to chronic illness, cancer and cancer related treatments as evidenced by severe fat depletion, severe muscle depletion.  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 Admitted with closed L ankle fracture.  RD very familiar with pt's history as he was closely followed by RD service during previous 14-monthadmission.  Pt lost this weight during admission due to limited, but stable intake of about 50% of each meal and sometimes drinking Ensure shakes.  Per Epic, pt has lost 6 lbs (5%) in the last 2 months, which is not necessarily significant for the time frame.  On exam, pt depleted in all areas severely.  Recommend increasing Ensure to QID, adding snacks TID, and double protein at meals to promote intake. Also recommend starting MVI with minerals.  Medications: reviewed; Os-Cal TID, dexamethasone, EE BID, Remeron  Labs: reviewed; Glucose 103 (H), corrected Ca 6.3 (L)  NUTRITION - FOCUSED PHYSICAL EXAM: Flowsheet Row Most Recent Value  Orbital Region Severe depletion  Upper Arm Region Severe depletion  Thoracic and Lumbar Region Severe depletion  Buccal Region Severe depletion  Temple Region Severe depletion  Clavicle Bone Region Severe depletion  Clavicle and Acromion Bone Region Severe depletion  Scapular  Bone Region Severe depletion  Dorsal Hand Severe depletion  Patellar Region Severe depletion  Anterior Thigh Region Severe depletion  Posterior Calf Region Severe depletion  Edema (RD Assessment) None  Hair Reviewed  Eyes Reviewed  Mouth Reviewed  Skin Reviewed  Nails Reviewed   Diet Order:   Diet Order             Diet regular Room service appropriate? Yes; Fluid consistency: Thin  Diet effective now                  EDUCATION NEEDS:  Education needs have been addressed  Skin:  Skin Assessment: Reviewed RN Assessment  Last BM:  06/02/21  Height:  Ht Readings from Last 1 Encounters:  06/02/21 '6\' 5"'$  (1.956 m)   Weight:  Wt Readings from Last 1 Encounters:  06/02/21 52 kg   BMI:  Body mass index is 13.59 kg/m.  Estimated Nutritional Needs:  Kcal:  2400-2600 Protein:  130-150 grams Fluid:  >2.4 L  MDerrel Nip RD, LDN (she/her/hers) Registered Dietitian I After-Hours/Weekend Pager # in [place]

## 2021-06-04 NOTE — TOC Initial Note (Signed)
Transition of Care [facility]) - Initial/Assessment Note    Patient Details  Name: Ryan [name] MRN: [id number] Date of Birth: [birth year]  [facility] CM/SW Contact:    Su Hilt, RN Phone Number: 06/04/2021, 10:30 AM  Clinical Narrative:                 Met with the patient in the room aty the bedside to discuss DC plan  \He stated that he was going to a [facility] when he left on 8/24 but stated that is not where he fell, he stated that he had the [place] driver drive him around to hope to find a different place to stay, he stated he did not fll at the homeless shelter, When asked where he was when he fell he said he did not know exactly where he was, I explained that when [facility] sets up the transport that they are to take him to a specific address He then quit answering question, he stated that he was calling family to maybe make arrangements for a place to go at DC, he stated he has applied for Medicaid I explained that he would not be in the hospital for long term for the diagnosis of Ankle and wrist fracture, he stated understanding and said he is working on his DC plan of where to go.  When asked about going to Short term rehab, he would not answer        Patient Goals and CMS Choice        Expected Discharge Plan and Services                                                Prior Living Arrangements/Services                       Activities of Daily Living      Permission Sought/Granted                  Emotional Assessment              Admission diagnosis:  Hyperkalemia [E87.5] Closed left ankle fracture [S82.892A] Closed bimalleolar fracture of left ankle, initial encounter [S82.842A] Closed fracture of distal end of left radius, unspecified fracture morphology, initial encounter [S52.502A] Patient Active Problem List   Diagnosis Date Noted   Closed left ankle fracture 06/03/2021   Left wrist fracture,  closed, initial encounter 06/03/2021   Hypocalcemia 06/03/2021   Hyperkalemia 06/03/2021   Hypotension 04/07/2021   Prostate cancer metastatic to bone ([place]) 04/06/2021   Anemia of chronic disease 03/30/2021   Thrombocytopenia ([place]) 03/30/2021   Thoracic compression fracture, closed, initial encounter ([place]) 03/30/2021   Pathologic fracture of thoracic vertebrae, initial encounter 03/30/2021   Malnourished ([place]) 03/30/2021   Protein-calorie malnutrition, severe ([place]) 03/30/2021   Cancer cachexia ([place]) 03/30/2021   Infestation by bed bug 03/30/2021   Fall    Transaminitis    Pain    Closed right hip fracture ([place]) 04/01/2020   Symptomatic anemia 04/01/2020   Hyponatremia 04/01/2020   Closed rib fracture 04/01/2020   Leukocytosis 04/01/2020   Rhabdomyolysis 04/01/2020   Dehydration 04/01/2020   PCP:  Pcp, No Pharmacy:   [facility] [place], [address] [place]  [place] [address] Phone: [phone] Fax: [place], [address] [place] [address] Phone: [phone] Fax: [phone]  [facility] [address] [place] [address] Phone: [phone] Fax: [phone]     Social Determinants of Health (SDOH) Interventions    Readmission Risk Interventions Readmission Risk Prevention Plan 04/23/2021  Transportation Screening Complete  PCP or Specialist Appt within 3-5 Days Complete  HRI or Home Care Consult Complete  Social Work Consult for [place] Planning/Counseling Not Complete  SW consult not completed comments RNCM assigned to patient  Palliative Care Screening Complete  Medication Review Press photographer) Complete  Some recent data might be hidden

## 2021-06-04 NOTE — Progress Notes (Addendum)
PROGRESS NOTE    Ryan [name]  [id number] DOB: [birth year] DOA: 06/02/2021 PCP: Pcp, No   Brief Narrative: Taken from H&P. Ryan [name] is a 60 y.o. 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   Patient was recently hospitalized from 6/21-8/24 due to metastasized prostate cancer. Pt was seen by [name] of oncology. He got his last prostate cancer treatment yesterday. He was just discharged from the hospital yesterday. He was supposed to go to a homeless shelter but apparently there were issues when he was transported there. He sates that he he felt dizzy and fell accidentally. No LOC. He injured his left wrist and left ankle, causing severe pain in left ankle and left wrist.    Left wrist x-ray with fracture of distal radius and ulna.  CT left ankle with impacted distal tibial and fibular fractures, mild posterior displacement and angulation.  Orthopedic was consulted and they are recommending conservative management based on his other comorbidities and advanced malignancy.  CT head without any new abnormality.  Prior osseous mets noted.  PT/OT is recommending SNF.  Still pending Medicaid application.  Subjective: Patient was seen and examined today.  Occupational Therapy at bedside.  Stating that pain is well controlled under current regimen.  He is waiting for his Medicaid approval which was applied during prior hospitalization.  Assessment & Plan:   Principal Problem:   Closed left ankle fracture Active Problems:   Fall   Anemia of chronic disease   Protein-calorie malnutrition, severe (HCC)   Cancer cachexia (HCC)   Prostate cancer metastatic to bone (HCC)   Left wrist fracture, closed, initial encounter   Hypocalcemia   Hyperkalemia  Closed left ankle fracture and left wrist fracture, closed, initial encounter: consulted [name] of  ortho, given patient's metastasized prostate cancer, recommended conservative treatment. -Continue with pain management -PT/OT is recommending SNF.  Anemia of chronic disease.  Hemoglobin at 8 today, it was 9.5 yesterday, no obvious bleeding but all cell lines decreased as he received some IV fluid.  Patient also received his chemotherapy a day before. -Continue to monitor -Transfuse if below 7.  Severe protein caloric malnutrition/cancer cachexia. -Continue with Ensure  Prostate cancer metastatic to bone [facility]): Consulted [name].  Per [name], Pt was seen in hospital in previous [place]. He got his last prostate cancer treatment yesterday, is not due for another 4 weeks; already has out patient follow up scheduled.    Hypocalcemia.  Corrected calcium of 6.3. -Give him 2 g of calcium gluconate -Follow-up vitamin D levels. -Continue with calcium supplement -Continue to monitor  Hyperkalemia.  Resolved with [place]. -Continue to monitor  Anxiety and depression. -Continue with hydroxyzine and Remeron   Objective: Vitals:   06/04/21 0334 06/04/21 0731 06/04/21 1109 06/04/21 1536  BP: 106/68 (!) 156/76 124/80 (!) 129/58  Pulse: 98 79 91 88  Resp: 20     Temp: 98.5 F (36.9 C) 97.6 F (36.4 C) 98.1 F (36.7 C) 97.9 F (36.6 C)  TempSrc:  Oral    SpO2: 96% 99% 100% 98%  Weight:      Height:        Intake/Output Summary (Last 24 hours) at 06/04/2021 1537 Last data filed at 06/04/2021 1500 Gross per 24 hour  Intake 24.95 ml  Output 600 ml  Net -575.05 ml   Filed Weights   06/02/21 1634  Weight: 52 kg  Examination:  General exam: Frail gentleman, appears calm and comfortable  Respiratory system: Clear to auscultation. Respiratory effort normal. Cardiovascular system: S1 & S2 heard, RRR.  Gastrointestinal system: Soft, nontender, nondistended, bowel sounds positive. Central nervous system: Alert and oriented. No focal neurological deficits.Symmetric 5 x 5  power. Extremities: Left upper and lower extremity with Ace wrap. Psychiatry: Judgement and insight appear normal.    DVT prophylaxis: Lovenox Code Status: DNR Family Communication: No family available Disposition Plan:  Status is: Inpatient  Remains inpatient appropriate because:Inpatient level of care appropriate due to severity of illness  Dispo: The patient is from: Home              Anticipated d/c is to: SNF              Patient currently is medically stable to d/c.   Difficult to place patient Yes              Level of care: Med-Surg  All the records are reviewed and case discussed with Care Management/Social Worker. Management plans discussed with the patient, nursing and they are in agreement.  Consultants:  Orthopedic surgery.  Procedures:  Antimicrobials:   Data Reviewed: I have personally reviewed following labs and imaging studies  CBC: Recent Labs  Lab 05/28/21 1822 06/02/21 1757 06/04/21 0528  WBC 8.7 10.2 7.4  NEUTROABS  --  8.0*  --   HGB 8.5* 9.5* 8.0*  HCT 27.6* 31.9* 25.7*  MCV 89.0 94.9 90.8  PLT 206 171 [id number]   Basic Metabolic Panel: Recent Labs  Lab 05/28/21 1822 06/02/21 1200 06/02/21 1757 06/02/21 2132 06/03/21 0417 06/04/21 0528  NA 135  --  138 137 137 135  K 4.9  --  5.7* 5.9* 4.5 4.9  CL 106  --  108 107 102 106  CO2 20*  --  21* 23 25 21*  GLUCOSE 118*  --  95 102* 110* 103*  BUN 36*  --  21* 20 21* 29*  CREATININE 0.53*  --  0.54* 0.52* 0.58* 0.51*  CALCIUM 6.9*  --  7.0* 6.6* 6.3* 5.6*  MG 1.7 1.6*  --   --   --   --   PHOS 3.8 4.2  --   --   --   --    GFR: Estimated Creatinine Clearance: 73.1 mL/min (A) (by C-G formula based on SCr of 0.51 mg/dL (L)). Liver Function Tests: Recent Labs  Lab 06/02/21 1757 06/04/21 0528  AST 24 16  ALT 22 17  ALKPHOS 1,902* 1,475*  BILITOT 1.0 0.8  PROT 6.5 5.7*  ALBUMIN 3.6 3.1*   Recent Labs  Lab 06/02/21 1757  LIPASE 151*   No results for input(s): AMMONIA in the last  168 hours. Coagulation Profile: Recent Labs  Lab 06/03/21 1439  INR 1.4*   Cardiac Enzymes: No results for input(s): CKTOTAL, CKMB, CKMBINDEX, TROPONINI in the last 168 hours. BNP (last 3 results) No results for input(s): PROBNP in the last 8760 hours. HbA1C: No results for input(s): HGBA1C in the last 72 hours. CBG: No results for input(s): GLUCAP in the last 168 hours. Lipid Profile: No results for input(s): CHOL, HDL, LDLCALC, TRIG, CHOLHDL, LDLDIRECT in the last 72 hours. Thyroid Function Tests: No results for input(s): TSH, T4TOTAL, FREET4, T3FREE, THYROIDAB in the last 72 hours. Anemia Panel: No results for input(s): VITAMINB12, FOLATE, FERRITIN, TIBC, IRON, RETICCTPCT in the last 72 hours. Sepsis Labs: No results for input(s): PROCALCITON, LATICACIDVEN in the last 168 hours.  Recent Results (from the past 240 hour(s))  Resp Panel by RT-PCR (Flu A&B, Covid) Nasopharyngeal Swab     Status: None   Collection Time: 06/03/21  5:36 AM   Specimen: Nasopharyngeal Swab; Nasopharyngeal(NP) swabs in vial transport medium  Result Value Ref Range Status   SARS Coronavirus 2 by RT PCR NEGATIVE NEGATIVE Final    Comment: (NOTE) SARS-CoV-2 target nucleic acids are NOT DETECTED.  The SARS-CoV-2 RNA is generally detectable in upper respiratory specimens during the acute phase of infection. The lowest concentration of SARS-CoV-2 viral copies this assay can detect is 138 copies/mL. A negative result does not preclude SARS-Cov-2 infection and should not be used as the sole basis for treatment or other patient management decisions. A negative result may occur with  improper specimen collection/handling, submission of specimen other than nasopharyngeal swab, presence of viral mutation(s) within the areas targeted by this assay, and inadequate number of viral copies(<138 copies/mL). A negative result must be combined with clinical observations, patient history, and  epidemiological information. The expected result is Negative.  Fact Sheet for Patients:  EntrepreneurPulse.com.au  Fact Sheet for Healthcare Providers:  IncredibleEmployment.be  This test is no t yet approved or cleared by the [place] FDA and  has been authorized for detection and/or diagnosis of SARS-CoV-2 by FDA under an Emergency Use Authorization (EUA). This EUA will remain  in effect (meaning this test can be used) for the duration of the COVID-19 declaration under Section 564(b)(1) of the Act, 21 U.S.C.section 360bbb-3(b)(1), unless the authorization is terminated  or revoked sooner.       Influenza A by PCR NEGATIVE NEGATIVE Final   Influenza B by PCR NEGATIVE NEGATIVE Final    Comment: (NOTE) The Xpert Xpress SARS-CoV-2/FLU/RSV plus assay is intended as an aid in the diagnosis of influenza from Nasopharyngeal swab specimens and should not be used as a sole basis for treatment. Nasal washings and aspirates are unacceptable for Xpert Xpress SARS-CoV-2/FLU/RSV testing.  Fact Sheet for Patients: EntrepreneurPulse.com.au  Fact Sheet for Healthcare Providers: IncredibleEmployment.be  This test is not yet approved or cleared by the [place] FDA and has been authorized for detection and/or diagnosis of SARS-CoV-2 by FDA under an Emergency Use Authorization (EUA). This EUA will remain in effect (meaning this test can be used) for the duration of the COVID-19 declaration under Section 564(b)(1) of the Act, 21 U.S.C. section 360bbb-3(b)(1), unless the authorization is terminated or revoked.  Performed at [facility], [address] [place], [place] [address]      Radiology Studies: DG Wrist Complete Left  Result Date: 06/02/2021 CLINICAL DATA:  Fall EXAM: LEFT WRIST - COMPLETE 3+ VIEW COMPARISON:  None. FINDINGS: Acute fracture distal radius and ulna. Impacted fracture on the  dorsal surface of the distal radius does not appear to extend into the joint. Nondisplaced fracture distal ulna. Wrist joint appears normal. No carpal fracture. IMPRESSION: Fracture distal radius and ulna. Electronically Signed   By: Franchot Gallo M.D.   On: 06/02/2021 17:15   DG Ankle 2 Views Left  Result Date: 06/02/2021 CLINICAL DATA:  Fall EXAM: LEFT ANKLE - 2 VIEW COMPARISON:  04/23/2021 FINDINGS: Fracture noted in the distal tibial and fibular metaphyses, appears subacute, but was not present on prior study. No subluxation or dislocation. Diffuse soft tissue swelling. IMPRESSION: Distal tibial and fibular metaphyseal fractures, acute versus subacute. Electronically Signed   By: Rolm Baptise M.D.   On: 06/02/2021 20:50   CT HEAD WO CONTRAST (5MM)  Result Date: 06/03/2021 CLINICAL DATA:  Dizziness leading to a fall. Head trauma, minor, normal mental status. EXAM: CT HEAD WITHOUT CONTRAST TECHNIQUE: Contiguous axial images were obtained from the base of the skull through the vertex without intravenous contrast. COMPARISON:  MRI 04/03/2021 FINDINGS: Brain: Moderate generalized volume loss of the cerebral hemispheres. More advanced generalized cerebellar atrophy. No evidence of small-vessel disease by CT. No sign of acute infarction, mass lesion, hemorrhage, hydrocephalus or extra-axial collection. Small abnormalities described at MRI will be followed up with subsequent MRI examinations. Vascular: There is atherosclerotic calcification of the major vessels at the base of the brain. Skull: Abnormal appearance of the bones diffusely felt previously to represent osseous metastatic disease. Widespread Paget's disease could have a similar appearance. No lytic lesion. Sinuses/Orbits: Clear/normal Other: None IMPRESSION: No acute finding by CT. Cerebellar more than cerebral atrophy. No acute or focal brain finding. Abnormal appearance of the calvarium, skull base and upper cervical spine. Previously this was felt  to represent diffuse osseous metastatic disease by MRI. Electronically Signed   By: Nelson Chimes M.D.   On: 06/03/2021 17:52   CT Ankle Left Wo Contrast  Result Date: 06/03/2021 CLINICAL DATA:  Ankle pain, no prior imaging EXAM: CT OF THE LEFT ANKLE WITHOUT CONTRAST TECHNIQUE: Multidetector CT imaging of the left ankle was performed according to the standard protocol. Multiplanar CT image reconstructions were also generated. COMPARISON:  Foot radiograph 04/23/2021 FINDINGS: Bones/Joint/Cartilage Diffuse osteopenia. There is an impacted distal tibial metaphyseal fracture with mild posterior displacement and angulation. This does not extend to the tibiotalar joint. There is also an impacted distal fibular fracture with minimal posterior displacement and angulation. These fractures are transversely oriented. Ligaments Suboptimally assessed by CT. Muscles and Tendons The significant muscle atrophy. There is no evidence of tendon entrapment. Soft tissues Diffuse soft tissue swelling of the ankle. IMPRESSION: Impacted distal tibial and fibular fractures, with mild posterior displacement and angulation. No evidence of extension to the tibiotalar joint. Diffuse osteopenia.  Diffuse soft tissue swelling of the ankle. Electronically Signed   By: Maurine Simmering M.D.   On: 06/03/2021 13:09    Scheduled Meds:  calcium carbonate  1,250 mg Oral TID WC   dexamethasone  4 mg Oral Daily   enoxaparin (LOVENOX) injection  40 mg Subcutaneous Q24H   feeding supplement  237 mL Oral QID   mirtazapine  15 mg Oral QHS   multivitamin with minerals  1 tablet Oral Daily   nicotine  21 mg Transdermal Daily   Continuous Infusions:  sodium chloride Stopped (06/04/21 1216)     LOS: 1 day   Time spent: 40 minutes. More than 50% of the time was spent in counseling/coordination of care  Lorella Nimrod, MD [facility]  If 7PM-7AM, please contact night-coverage [email or web address]  06/04/2021, 3:37 PM   This record has been  created using Dragon voice recognition software. Errors have been sought and corrected,but may not always be located. Such creation errors do not reflect on the standard of care.

## 2021-06-04 NOTE — Consult Note (Signed)
ORTHOPAEDIC CONSULTATION  REQUESTING PHYSICIAN: Lorella Nimrod, MD  Chief Complaint:   L ankle and L wrist pain  History of Present Illness: Ryan [name] is a 60 y.o. male who had previously been admitted to the hospital for approximately 2 months after undergoing a right hip IM nail.  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  He is followed by [name] and oncology for this, and he has begun chemotherapy treatment.  He also had significant lower extremity weakness on the right side, likely due to severe spinal stenosis at L3-4.  He has a significant weight loss and is cachectic. He is homeless at baseline and plan was for discharge to a homeless shelter.  However, the patient returned Back to the ED on the same day of discharge on 06/02/2021 after he had a fall and noted significant pain in the left wrist and ankle.  Imaging in the emergency department showed a distal radius fracture and a left distal tibia and fibula fracture.  Past Medical History:  Diagnosis Date   Adenomatous colon polyp    Anemia    Depression    Elevated LFTs    Past Surgical History:  Procedure Laterality Date   FRACTURE SURGERY     INTRAMEDULLARY (IM) NAIL INTERTROCHANTERIC Right 04/02/2020   Procedure: INTRAMEDULLARY (IM) NAIL INTERTROCHANTRIC;  Surgeon: Corky Mull, MD;  Location: [facility] ORS;  Service: Orthopedics;  Laterality: Right;   Social History   Socioeconomic History   Marital status: Single    Spouse name: Not on file   Number of children: Not on file   Years of education: Not on file   Highest education level: Not on file  Occupational History   Not on file  Tobacco Use   Smoking status: Some Days   Smokeless tobacco: Never  Substance and Sexual Activity   Alcohol use: Yes    Comment: 4 beers weekly   Drug use: No   Sexual activity: Not on file  Other  Topics Concern   Not on file  Social History Narrative   Not on file   Social Determinants of Health   Financial Resource Strain: Not on file  Food Insecurity: Not on file  Transportation Needs: Not on file  Physical Activity: Not on file  Stress: Not on file  Social Connections: Not on file   Family History  Problem Relation Age of Onset   Heart disease Other    Colon polyps Cousin        maternal   Colon polyps Cousin    Colon polyps Maternal Aunt    Colon cancer Maternal Grandfather    No Known Allergies Prior to Admission medications   Medication Sig Start Date End Date Taking? Authorizing Provider  dexamethasone (DECADRON) 4 MG tablet Take 1 tablet (4 mg total) by mouth once daily. Patient not taking: Reported on 06/03/2021 06/03/21   Little Ishikawa, MD  feeding supplement, ENSURE ENLIVE, (ENSURE ENLIVE) LIQD Take 237 mLs by mouth 3 (three) times daily between meals. Patient not taking: Reported on 06/03/2021 04/06/20   Lorella Nimrod, MD  hydrOXYzine (ATARAX/VISTARIL) 25 MG tablet Take 1 tablet (25 mg total) by mouth 3 (three) times daily as needed for anxiety. Patient not taking: Reported on 06/03/2021 06/02/21   Little Ishikawa, MD  methocarbamol (ROBAXIN) 500 MG tablet Take 1 tablet (500 mg total) by mouth every 8 (eight) hours as needed for muscle spasms. Patient not taking:  Reported on 06/03/2021 06/02/21   Little Ishikawa, MD  midodrine (PROAMATINE) 5 MG tablet Take 2 tablets (10 mg total) by mouth 3 (three) times daily with meals. Patient not taking: Reported on 06/03/2021 06/02/21   Little Ishikawa, MD  mirtazapine (REMERON) 15 MG tablet Take 1 tablet (15 mg total) by mouth once daily at bedtime. Patient not taking: Reported on 06/03/2021 06/02/21   Little Ishikawa, MD   Recent Labs    06/02/21 1757 06/02/21 2132 06/03/21 0417 06/03/21 1439 06/04/21 0528  WBC 10.2  --   --   --  7.4  HGB 9.5*  --   --   --  8.0*  HCT 31.9*  --   --   --   25.7*  PLT 171  --   --   --  155  K 5.7*   < > 4.5  --  4.9  CL 108   < > 102  --  106  CO2 21*   < > 25  --  21*  BUN 21*   < > 21*  --  29*  CREATININE 0.54*   < > 0.58*  --  0.51*  GLUCOSE 95   < > 110*  --  103*  CALCIUM 7.0*   < > 6.3*  --  5.6*  INR  --   --   --  1.4*  --    < > = values in this interval not displayed.   DG Wrist Complete Left  Result Date: 06/02/2021 CLINICAL DATA:  Fall EXAM: LEFT WRIST - COMPLETE 3+ VIEW COMPARISON:  None. FINDINGS: Acute fracture distal radius and ulna. Impacted fracture on the dorsal surface of the distal radius does not appear to extend into the joint. Nondisplaced fracture distal ulna. Wrist joint appears normal. No carpal fracture. IMPRESSION: Fracture distal radius and ulna. Electronically Signed   By: Franchot Gallo M.D.   On: 06/02/2021 17:15   DG Ankle 2 Views Left  Result Date: 06/02/2021 CLINICAL DATA:  Fall EXAM: LEFT ANKLE - 2 VIEW COMPARISON:  04/23/2021 FINDINGS: Fracture noted in the distal tibial and fibular metaphyses, appears subacute, but was not present on prior study. No subluxation or dislocation. Diffuse soft tissue swelling. IMPRESSION: Distal tibial and fibular metaphyseal fractures, acute versus subacute. Electronically Signed   By: Rolm Baptise M.D.   On: 06/02/2021 20:50   CT HEAD WO CONTRAST (5MM)  Result Date: 06/03/2021 CLINICAL DATA:  Dizziness leading to a fall. Head trauma, minor, normal mental status. EXAM: CT HEAD WITHOUT CONTRAST TECHNIQUE: Contiguous axial images were obtained from the base of the skull through the vertex without intravenous contrast. COMPARISON:  MRI 04/03/2021 FINDINGS: Brain: Moderate generalized volume loss of the cerebral hemispheres. More advanced generalized cerebellar atrophy. No evidence of small-vessel disease by CT. No sign of acute infarction, mass lesion, hemorrhage, hydrocephalus or extra-axial collection. Small abnormalities described at MRI will be followed up with subsequent MRI  examinations. Vascular: There is atherosclerotic calcification of the major vessels at the base of the brain. Skull: Abnormal appearance of the bones diffusely felt previously to represent osseous metastatic disease. Widespread Paget's disease could have a similar appearance. No lytic lesion. Sinuses/Orbits: Clear/normal Other: None IMPRESSION: No acute finding by CT. Cerebellar more than cerebral atrophy. No acute or focal brain finding. Abnormal appearance of the calvarium, skull base and upper cervical spine. Previously this was felt to represent diffuse osseous metastatic disease by MRI. Electronically Signed   By: Elta Guadeloupe  Shogry M.D.   On: 06/03/2021 17:52   CT Ankle Left Wo Contrast  Result Date: 06/03/2021 CLINICAL DATA:  Ankle pain, no prior imaging EXAM: CT OF THE LEFT ANKLE WITHOUT CONTRAST TECHNIQUE: Multidetector CT imaging of the left ankle was performed according to the standard protocol. Multiplanar CT image reconstructions were also generated. COMPARISON:  Foot radiograph 04/23/2021 FINDINGS: Bones/Joint/Cartilage Diffuse osteopenia. There is an impacted distal tibial metaphyseal fracture with mild posterior displacement and angulation. This does not extend to the tibiotalar joint. There is also an impacted distal fibular fracture with minimal posterior displacement and angulation. These fractures are transversely oriented. Ligaments Suboptimally assessed by CT. Muscles and Tendons The significant muscle atrophy. There is no evidence of tendon entrapment. Soft tissues Diffuse soft tissue swelling of the ankle. IMPRESSION: Impacted distal tibial and fibular fractures, with mild posterior displacement and angulation. No evidence of extension to the tibiotalar joint. Diffuse osteopenia.  Diffuse soft tissue swelling of the ankle. Electronically Signed   By: Maurine Simmering M.D.   On: 06/03/2021 13:09     Positive ROS: All other systems have been reviewed and were otherwise negative with the exception  of those mentioned in the HPI and as above.  Physical Exam: BP (!) 156/76 (BP Location: Right Arm)   Pulse 79   Temp 97.6 F (36.4 C) (Oral)   Resp 20   Ht '6\' 5"'$  (1.956 m)   Wt 52 kg   SpO2 99%   BMI 13.59 kg/m  General:  Alert, no acute distress Psychiatric:  Patient is competent for consent with normal mood and affect   Cardiovascular:  No pedal edema, regular rate and rhythm Respiratory:  No wheezing, non-labored breathing GI:  Abdomen is soft and non-tender Skin:  No lesions in the area of chief complaint, no erythema Neurologic:  Sensation intact distally, CN grossly intact Lymphatic:  No axillary or cervical lymphadenopathy  Orthopedic Exam:  LUE: Able to wiggle fingers   SILT over fingers Fingers wwp Splint in place  LLE: Able to wiggle toes   SILT over toes  Toes wwp Splint in place   X-rays L wrist & L ankle /CT scan L ankle:  As above: There is an extra-articular fracture of the distal radius with loss of volar tilt and mild dorsal angulation. There is also a transversely oriented extra-articular fracture of the distal tibia and fibula with impaction of the distal tibia.  Alignment is and mild procurvatum and varus.  There is significant diffuse osteopenia  Assessment/Plan: 60 year old male with metastatic prostate cancer with cachexia and anemia and unstable home situation with left distal radius and distal tibia fractures. 1.  We discussed both factors as well as both nonoperative and operative surgical options.  The patient does not wish to consider any sort of surgery at this point in time, and given his current medical condition, this is certainly a reasonable option.  Therefore, we agreed to proceed with splinting of the left distal radius and left distal tibia.  2.  Nonweightbearing on the left upper extremity and left lower extremity.  3.  PT/OT for mobilization.  4.  We will plan to follow peripherally.  Please page with any questions.  He can  follow-up as an outpatient at [facility] clinic in approximately 2 weeks.    Leim Fabry   06/04/2021 7:39 AM

## 2021-06-04 NOTE — Progress Notes (Signed)
Patient has had an uneventful night. Pain has been well controlled tonight requiring no pain medications. Patient resting well at this time.

## 2021-06-04 NOTE — NC FL2 (Signed)
[place] LEVEL OF CARE SCREENING TOOL     IDENTIFICATION  Patient Name: Ryan [name] Birthdate: 02-13-1961 Sex: male Admission Date (Current Location): 06/02/2021  [facility] [place] and [place] Number:  Engineering geologist and Address:  [facility], [address] [place], [place] [address]      Provider Number: [id number]  Attending Physician Name and Address:  Lorella Nimrod, MD  Relative Name and Phone Number:  Fredrich Romans [phone]    Current Level of Care: Hospital Recommended Level of Care: [place] Prior Approval Number:    Date Approved/Denied:   PASRR Number: [id number]  Discharge Plan: SNF    Current Diagnoses: Patient Active Problem List   Diagnosis Date Noted   Closed left ankle fracture 06/03/2021   Left wrist fracture, closed, initial encounter 06/03/2021   Hypocalcemia 06/03/2021   Hyperkalemia 06/03/2021   Hypotension 04/07/2021   Prostate cancer metastatic to bone ([place]) 04/06/2021   Anemia of chronic disease 03/30/2021   Thrombocytopenia ([place]) 03/30/2021   Thoracic compression fracture, closed, initial encounter ([place]) 03/30/2021   Pathologic fracture of thoracic vertebrae, initial encounter 03/30/2021   Malnourished ([place]) 03/30/2021   Protein-calorie malnutrition, severe ([place]) 03/30/2021   Cancer cachexia ([place]) 03/30/2021   Infestation by bed bug 03/30/2021   Fall    Transaminitis    Pain    Closed right hip fracture ([place]) 04/01/2020   Symptomatic anemia 04/01/2020   Hyponatremia 04/01/2020   Closed rib fracture 04/01/2020   Leukocytosis 04/01/2020   Rhabdomyolysis 04/01/2020   Dehydration 04/01/2020    Orientation RESPIRATION BLADDER Height & Weight     Self, Time, Situation, Place  Normal Continent Weight: 52 kg Height:  '6\' 5"'$  (195.6 cm)  BEHAVIORAL SYMPTOMS/MOOD NEUROLOGICAL BOWEL NUTRITION STATUS      Continent Diet  AMBULATORY STATUS COMMUNICATION OF NEEDS Skin   Extensive  Assist Verbally Normal                       Personal Care Assistance Level of Assistance  Bathing, Dressing Bathing Assistance: Limited assistance   Dressing Assistance: Limited assistance     Functional Limitations Info             SPECIAL CARE FACTORS FREQUENCY  PT (By licensed PT), OT (By licensed OT)     PT Frequency: 5 times per week OT Frequency: 5 times per week            Contractures Contractures Info: Not present    Additional Factors Info  Code Status, Allergies Code Status Info: DNR Allergies Info: NKDA           Current Medications (06/04/2021):  This is the current hospital active medication list Current Facility-Administered Medications  Medication Dose Route Frequency Provider Last Rate Last Admin   0.9 %  sodium chloride infusion   Intravenous PRN Lorella Nimrod, MD   Stopped at 06/04/21 1216   acetaminophen (TYLENOL) 160 MG/5ML solution 650 mg  650 mg Oral Q6H PRN Ivor Costa, MD       calcium carbonate (OS-CAL - dosed in mg of elemental calcium) tablet 1,250 mg  1,250 mg Oral TID WC Ivor Costa, MD   1,250 mg at 06/04/21 1215   dexamethasone (DECADRON) tablet 4 mg  4 mg Oral Daily Ivor Costa, MD   4 mg at 06/04/21 0837   enoxaparin (LOVENOX) injection 40 mg  40 mg Subcutaneous Q24H Ivor Costa, MD       feeding supplement (  ENSURE ENLIVE / ENSURE PLUS) liquid 237 mL  237 mL Oral QID Lorella Nimrod, MD   237 mL at 06/04/21 1330   hydrOXYzine (ATARAX/VISTARIL) tablet 25 mg  25 mg Oral TID PRN Ivor Costa, MD       mirtazapine (REMERON) tablet 15 mg  15 mg Oral QHS Ivor Costa, MD   15 mg at 06/03/21 2225   morphine 2 MG/ML injection 2 mg  2 mg Intravenous Q4H PRN Ivor Costa, MD       multivitamin with minerals tablet 1 tablet  1 tablet Oral Daily Lorella Nimrod, MD   1 tablet at 06/04/21 1215   nicotine (NICODERM CQ - dosed in mg/24 hours) patch 21 mg  21 mg Transdermal Daily Ivor Costa, MD       ondansetron [facility]) injection 4 mg  4 mg Intravenous  Q8H PRN Ivor Costa, MD   4 mg at 06/03/21 2036   oxyCODONE-acetaminophen (PERCOCET/ROXICET) 5-325 MG per tablet 1 tablet  1 tablet Oral Q4H PRN Ivor Costa, MD       traZODone (DESYREL) tablet 25 mg  25 mg Oral QHS PRN Mansy, Arvella Merles, MD         Discharge Medications: Please see discharge summary for a list of discharge medications.  Relevant Imaging Results:  Relevant Lab Results:   Additional Information SS# [id number]  Su Hilt, RN

## 2021-06-05 LAB — RENAL FUNCTION PANEL
Albumin: 3.2 g/dL — ABNORMAL LOW (ref 3.5–5.0)
Anion gap: 7 (ref 5–15)
BUN: 27 mg/dL — ABNORMAL HIGH (ref 6–20)
CO2: 20 mmol/L — ABNORMAL LOW (ref 22–32)
Calcium: 6.5 mg/dL — ABNORMAL LOW (ref 8.9–10.3)
Chloride: 107 mmol/L (ref 98–111)
Creatinine, Ser: 0.54 mg/dL — ABNORMAL LOW (ref 0.61–1.24)
GFR, Estimated: >60 mL/min (ref >60)
Glucose, Bld: 98 mg/dL (ref 70–99)
Phosphorus: 3.3 mg/dL (ref 2.5–4.6)
Potassium: 5.3 mmol/L — ABNORMAL HIGH (ref 3.5–5.1)
Sodium: 134 mmol/L — ABNORMAL LOW (ref 135–145)

## 2021-06-05 LAB — CBC
HCT: 26.5 % — ABNORMAL LOW (ref 39.0–52.0)
Hemoglobin: 8.2 g/dL — ABNORMAL LOW (ref 13.0–17.0)
MCH: 27.7 pg (ref 26.0–34.0)
MCHC: 30.9 g/dL (ref 30.0–36.0)
MCV: 89.5 fL (ref 80.0–100.0)
Platelets: 149 10*3/uL — ABNORMAL LOW (ref 150–400)
RBC: 2.96 MIL/uL — ABNORMAL LOW (ref 4.22–5.81)
RDW: 21.1 % — ABNORMAL HIGH (ref 11.5–15.5)
WBC: 7.5 10*3/uL (ref 4.0–10.5)
nRBC: 0.4 % — ABNORMAL HIGH (ref 0.0–0.2)

## 2021-06-05 LAB — POTASSIUM: Potassium: 4.9 mmol/L (ref 3.5–5.1)

## 2021-06-05 MED ORDER — SODIUM ZIRCONIUM CYCLOSILICATE 10 G PO PACK
10.0000 g | PACK | Freq: Every day | ORAL | Status: DC
  Administered 2021-06-05 – 2021-06-08 (×2): 10 g via ORAL
  Filled 2021-06-05 (×6): qty 1

## 2021-06-05 MED ORDER — CALCIUM GLUCONATE-NACL 1-0.675 GM/50ML-% IV SOLN
1.0000 g | Freq: Once | INTRAVENOUS | Status: AC
  Administered 2021-06-05: 1000 mg via INTRAVENOUS
  Filled 2021-06-05: qty 50

## 2021-06-05 NOTE — Progress Notes (Signed)
  Subjective:   3 days after sustaining L distal radius fracture and distal tibia/fibula fractures Patient reports pain as well-controlled.   Patient is well, and has had no acute complaints or problems Awaiting  Negative for chest pain and shortness of breath Fever: no Gastrointestinal: negative for nausea and vomiting.    Objective: Vital signs in last 24 hours: Temp:  [97.9 F (36.6 C)-98.1 F (36.7 C)] 98 F (36.7 C) (08/27 0450) Pulse Rate:  [73-95] 73 (08/27 0753) Resp:  [16-18] 18 (08/27 0753) BP: (108-150)/(58-132) 150/132 (08/27 0753) SpO2:  [97 %-100 %] 100 % (08/27 0753)  Intake/Output from previous day:  Intake/Output Summary (Last 24 hours) at 06/05/2021 1106 Last data filed at 06/05/2021 1032 Gross per 24 hour  Intake 504.95 ml  Output 775 ml  Net -270.05 ml    Intake/Output this shift: Total I/O In: 480 [P.O.:480] Out: -   Labs: Recent Labs    06/02/21 1757 06/04/21 0528 06/05/21 0522  HGB 9.5* 8.0* 8.2*   Recent Labs    06/04/21 0528 06/05/21 0522  WBC 7.4 7.5  RBC 2.83* 2.96*  HCT 25.7* 26.5*  PLT 155 149*   Recent Labs    06/04/21 0528 06/05/21 0522  NA 135 134*  K 4.9 5.3*  CL 106 107  CO2 21* 20*  BUN 29* 27*  CREATININE 0.51* 0.54*  GLUCOSE 103* 98  CALCIUM 5.6* 6.5*   Recent Labs    06/03/21 1439  INR 1.4*     EXAM General - Patient is Alert, Appropriate, and Oriented Extremity -  splints intact to L ankle and L wrist, able to flex and extend fingers, decreased sensation over fingertips, otherwise N/V intact  able to flex and extend toes, sensation intact over exposed foot   Assessment/Plan:    Principal Problem:   Closed left ankle fracture Active Problems:   Fall   Anemia of chronic disease   Protein-calorie malnutrition, severe ([place])   Cancer cachexia ([place])   Prostate cancer metastatic to bone ([place])   Left wrist fracture, closed, initial encounter   Hypocalcemia   Hyperkalemia   Closed fracture of left  distal radius  Estimated body mass index is 13.59 kg/m as calculated from the following:   Height as of this encounter: '6\' 5"'$  (1.956 m).   Weight as of this encounter: 52 kg. Advance diet Up with therapy     DVT Prophylaxis - Lovenox NonWeight-Bearing as tolerated to [place], PA-C [facility] 06/05/2021, 11:06 AM

## 2021-06-05 NOTE — Progress Notes (Signed)
PROGRESS NOTE    Ryan [name]  [id number] DOB: 1961-02-27 DOA: 06/02/2021 PCP: Pcp, No   Brief Narrative: Taken from H&P. Ryan [name] is a 60 y.o. 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   Patient was recently hospitalized from 6/21-8/24 due to metastasized prostate cancer. Pt was seen by [name] of oncology. He got his last prostate cancer treatment yesterday. He was just discharged from the hospital yesterday. He was supposed to go to a homeless shelter but apparently there were issues when he was transported there. He sates that he he felt dizzy and fell accidentally. No LOC. He injured his left wrist and left ankle, causing severe pain in left ankle and left wrist.    Left wrist x-ray with fracture of distal radius and ulna.  CT left ankle with impacted distal tibial and fibular fractures, mild posterior displacement and angulation.  Orthopedic was consulted and they are recommending conservative management based on his other comorbidities and advanced malignancy.  Patient will be nonweightbearing her left lower extremity.  CT head without any new abnormality.  Prior osseous mets noted.  PT/OT is recommending SNF.  Still pending Medicaid application.  Subjective: Patient was resting comfortably when seen today.  Easily arousable but stating that he wants to get some sleep and denies any new complaints.  Stating that pain is well controlled.  Assessment & Plan:   Principal Problem:   Closed left ankle fracture Active Problems:   Fall   Anemia of chronic disease   Protein-calorie malnutrition, severe (HCC)   Cancer cachexia (HCC)   Prostate cancer metastatic to bone (HCC)   Left wrist fracture, closed, initial encounter   Hypocalcemia   Hyperkalemia   Closed fracture of left distal radius  Closed left ankle fracture and left wrist  fracture, closed, initial encounter: consulted [name] of ortho, given patient's metastasized prostate cancer, recommended conservative treatment.  Patient will be nonweightbearing on left lower extremity and follow-up with orthopedic as an outpatient. -Continue with pain management -PT/OT is recommending SNF.  Anemia of chronic disease.  Hemoglobin stable around 8.2, no obvious bleeding.  Patient also received his chemotherapy a day before. -Continue to monitor -Transfuse if below 7.  Hyperkalemia.  Potassium at 5.3. -Give him Lokelma and monitor  Severe protein caloric malnutrition/cancer cachexia. -Continue with Ensure  Prostate cancer metastatic to bone [facility]): Consulted [name].  Per [name], Pt was seen in hospital in previous [place]. He got his last prostate cancer treatment yesterday, is not due for another 4 weeks; already has out patient follow up scheduled.    Hypocalcemia.  Corrected calcium of 6.9.  Received 2 g of calcium gluconate IV yesterday. -Give him another 1 g of calcium gluconate -Follow-up vitamin D levels. -Continue with calcium supplement -Continue to monitor  Anxiety and depression. -Continue with hydroxyzine and Remeron  Severe protein caloric malnutrition. Estimated body mass index is 13.59 kg/m as calculated from the following:   Height as of this encounter: '6\' 5"'$  (1.956 m).   Weight as of this encounter: 52 kg.  -Dietitian consult   Objective: Vitals:   06/04/21 2309 06/05/21 0450 06/05/21 0753 06/05/21 1208  BP: 112/72 108/69 (!) 150/132 102/62  Pulse: 88 87 73 90  Resp: '17 16 18 18  '$ Temp: 97.9 F (36.6 C) 98 F (36.7 C)    TempSrc:      SpO2: 98% 98%  100% 95%  Weight:      Height:        Intake/Output Summary (Last 24 hours) at 06/05/2021 1339 Last data filed at 06/05/2021 1032 Gross per 24 hour  Intake 504.95 ml  Output 775 ml  Net -270.05 ml    Filed Weights   06/02/21 1634  Weight: 52 kg     Examination:  General.  Malnourished gentleman, in no acute distress. Pulmonary.  Lungs clear bilaterally, normal respiratory effort. CV.  Regular rate and rhythm, no JVD, rub or murmur. Abdomen.  Soft, nontender, nondistended, BS positive. CNS.  Alert and oriented .  No focal neurologic deficit. Extremities.  No edema, no cyanosis, left upper and lower extremity with Ace wrap. Psychiatry.  Judgment and insight appears normal.   DVT prophylaxis: Lovenox Code Status: DNR Family Communication: No family available.  Patient is homeless Disposition Plan:  Status is: Inpatient  Remains inpatient appropriate because:Inpatient level of care appropriate due to severity of illness  Dispo: The patient is from: Home              Anticipated d/c is to: SNF              Patient currently is medically stable to d/c.   Difficult to place patient Yes              Level of care: Med-Surg  All the records are reviewed and case discussed with Care Management/Social Worker. Management plans discussed with the patient, nursing and they are in agreement.  Consultants:  Orthopedic surgery.  Procedures:  Antimicrobials:   Data Reviewed: I have personally reviewed following labs and imaging studies  CBC: Recent Labs  Lab 06/02/21 1757 06/04/21 0528 06/05/21 0522  WBC 10.2 7.4 7.5  NEUTROABS 8.0*  --   --   HGB 9.5* 8.0* 8.2*  HCT 31.9* 25.7* 26.5*  MCV 94.9 90.8 89.5  PLT 171 155 149*    Basic Metabolic Panel: Recent Labs  Lab 06/02/21 1200 06/02/21 1757 06/02/21 2132 06/03/21 0417 06/04/21 0528 06/05/21 0522  NA  --  138 137 137 135 134*  K  --  5.7* 5.9* 4.5 4.9 5.3*  CL  --  108 107 102 106 107  CO2  --  21* 23 25 21* 20*  GLUCOSE  --  95 102* 110* 103* 98  BUN  --  21* 20 21* 29* 27*  CREATININE  --  0.54* 0.52* 0.58* 0.51* 0.54*  CALCIUM  --  7.0* 6.6* 6.3* 5.6* 6.5*  MG 1.6*  --   --   --   --   --   PHOS 4.2  --   --   --   --  3.3    GFR: Estimated  Creatinine Clearance: 73.1 mL/min (A) (by C-G formula based on SCr of 0.54 mg/dL (L)). Liver Function Tests: Recent Labs  Lab 06/02/21 1757 06/04/21 0528 06/05/21 0522  AST 24 16  --   ALT 22 17  --   ALKPHOS 1,902* 1,475*  --   BILITOT 1.0 0.8  --   PROT 6.5 5.7*  --   ALBUMIN 3.6 3.1* 3.2*    Recent Labs  Lab 06/02/21 1757  LIPASE 151*    No results for input(s): AMMONIA in the last 168 hours. Coagulation Profile: Recent Labs  Lab 06/03/21 1439  INR 1.4*    Cardiac Enzymes: No results for input(s): CKTOTAL, CKMB, CKMBINDEX, TROPONINI in the last 168 hours. BNP (last 3 results) No  results for input(s): PROBNP in the last 8760 hours. HbA1C: No results for input(s): HGBA1C in the last 72 hours. CBG: No results for input(s): GLUCAP in the last 168 hours. Lipid Profile: No results for input(s): CHOL, HDL, LDLCALC, TRIG, CHOLHDL, LDLDIRECT in the last 72 hours. Thyroid Function Tests: No results for input(s): TSH, T4TOTAL, FREET4, T3FREE, THYROIDAB in the last 72 hours. Anemia Panel: No results for input(s): VITAMINB12, FOLATE, FERRITIN, TIBC, IRON, RETICCTPCT in the last 72 hours. Sepsis Labs: No results for input(s): PROCALCITON, LATICACIDVEN in the last 168 hours.  Recent Results (from the past 240 hour(s))  Resp Panel by RT-PCR (Flu A&B, Covid) Nasopharyngeal Swab     Status: None   Collection Time: 06/03/21  5:36 AM   Specimen: Nasopharyngeal Swab; Nasopharyngeal(NP) swabs in vial transport medium  Result Value Ref Range Status   SARS Coronavirus 2 by RT PCR NEGATIVE NEGATIVE Final    Comment: (NOTE) SARS-CoV-2 target nucleic acids are NOT DETECTED.  The SARS-CoV-2 RNA is generally detectable in upper respiratory specimens during the acute phase of infection. The lowest concentration of SARS-CoV-2 viral copies this assay can detect is 138 copies/mL. A negative result does not preclude SARS-Cov-2 infection and should not be used as the sole basis for  treatment or other patient management decisions. A negative result may occur with  improper specimen collection/handling, submission of specimen other than nasopharyngeal swab, presence of viral mutation(s) within the areas targeted by this assay, and inadequate number of viral copies(<138 copies/mL). A negative result must be combined with clinical observations, patient history, and epidemiological information. The expected result is Negative.  Fact Sheet for Patients:  EntrepreneurPulse.com.au  Fact Sheet for Healthcare Providers:  IncredibleEmployment.be  This test is no t yet approved or cleared by the [place] FDA and  has been authorized for detection and/or diagnosis of SARS-CoV-2 by FDA under an Emergency Use Authorization (EUA). This EUA will remain  in effect (meaning this test can be used) for the duration of the COVID-19 declaration under Section 564(b)(1) of the Act, 21 U.S.C.section 360bbb-3(b)(1), unless the authorization is terminated  or revoked sooner.       Influenza A by PCR NEGATIVE NEGATIVE Final   Influenza B by PCR NEGATIVE NEGATIVE Final    Comment: (NOTE) The Xpert Xpress SARS-CoV-2/FLU/RSV plus assay is intended as an aid in the diagnosis of influenza from Nasopharyngeal swab specimens and should not be used as a sole basis for treatment. Nasal washings and aspirates are unacceptable for Xpert Xpress SARS-CoV-2/FLU/RSV testing.  Fact Sheet for Patients: EntrepreneurPulse.com.au  Fact Sheet for Healthcare Providers: IncredibleEmployment.be  This test is not yet approved or cleared by the [place] FDA and has been authorized for detection and/or diagnosis of SARS-CoV-2 by FDA under an Emergency Use Authorization (EUA). This EUA will remain in effect (meaning this test can be used) for the duration of the COVID-19 declaration under Section 564(b)(1) of the Act, 21  U.S.C. section 360bbb-3(b)(1), unless the authorization is terminated or revoked.  Performed at [facility], [address] [place], [place] [address]       Radiology Studies: CT HEAD WO CONTRAST (5MM)  Result Date: 06/03/2021 CLINICAL DATA:  Dizziness leading to a fall. Head trauma, minor, normal mental status. EXAM: CT HEAD WITHOUT CONTRAST TECHNIQUE: Contiguous axial images were obtained from the base of the skull through the vertex without intravenous contrast. COMPARISON:  MRI 04/03/2021 FINDINGS: Brain: Moderate generalized volume loss of the cerebral hemispheres. More advanced generalized cerebellar  atrophy. No evidence of small-vessel disease by CT. No sign of acute infarction, mass lesion, hemorrhage, hydrocephalus or extra-axial collection. Small abnormalities described at MRI will be followed up with subsequent MRI examinations. Vascular: There is atherosclerotic calcification of the major vessels at the base of the brain. Skull: Abnormal appearance of the bones diffusely felt previously to represent osseous metastatic disease. Widespread Paget's disease could have a similar appearance. No lytic lesion. Sinuses/Orbits: Clear/normal Other: None IMPRESSION: No acute finding by CT. Cerebellar more than cerebral atrophy. No acute or focal brain finding. Abnormal appearance of the calvarium, skull base and upper cervical spine. Previously this was felt to represent diffuse osseous metastatic disease by MRI. Electronically Signed   By: Nelson Chimes M.D.   On: 06/03/2021 17:52    Scheduled Meds:  calcium carbonate  1,250 mg Oral TID WC   dexamethasone  4 mg Oral Daily   enoxaparin (LOVENOX) injection  40 mg Subcutaneous Q24H   feeding supplement  237 mL Oral QID   mirtazapine  15 mg Oral QHS   multivitamin with minerals  1 tablet Oral Daily   nicotine  21 mg Transdermal Daily   sodium zirconium cyclosilicate  10 g Oral Daily   Continuous Infusions:  sodium chloride Stopped  (06/04/21 1216)     LOS: 2 days   Time spent: 34 minutes. More than 50% of the time was spent in counseling/coordination of care  Lorella Nimrod, MD [facility]  If 7PM-7AM, please contact night-coverage [email or web address]  06/05/2021, 1:39 PM   This record has been created using Systems analyst. Errors have been sought and corrected,but may not always be located. Such creation errors do not reflect on the standard of care.

## 2021-06-06 LAB — POTASSIUM: Potassium: 5 mmol/L (ref 3.5–5.1)

## 2021-06-06 NOTE — Progress Notes (Signed)
  Subjective:   4 days after sustaining L distal radius fracture and L distal tibia/fibula fractures Patient reports pain as well-controlled.  Patient resting in bed. Patient is well, and has had no acute complaints or problems  Objective: Vital signs in last 24 hours: Temp:  [97.8 F (36.6 C)-98.7 F (37.1 C)] 98.7 F (37.1 C) (08/28 0816) Pulse Rate:  [82-98] 82 (08/28 0816) Resp:  [18-20] 18 (08/28 0816) BP: (102-122)/(60-74) 103/74 (08/28 0816) SpO2:  [95 %-100 %] 100 % (08/28 0816)  Intake/Output from previous day:  Intake/Output Summary (Last 24 hours) at 06/06/2021 1023 Last data filed at 06/06/2021 0416 Gross per 24 hour  Intake 480 ml  Output 1400 ml  Net -920 ml    Intake/Output this shift: No intake/output data recorded.  Labs: Recent Labs    06/04/21 0528 06/05/21 0522  HGB 8.0* 8.2*   Recent Labs    06/04/21 0528 06/05/21 0522  WBC 7.4 7.5  RBC 2.83* 2.96*  HCT 25.7* 26.5*  PLT 155 149*   Recent Labs    06/04/21 0528 06/05/21 0522 06/05/21 1349  NA 135 134*  --   K 4.9 5.3* 4.9  CL 106 107  --   CO2 21* 20*  --   BUN 29* 27*  --   CREATININE 0.51* 0.54*  --   GLUCOSE 103* 98  --   CALCIUM 5.6* 6.5*  --    Recent Labs    06/03/21 1439  INR 1.4*     EXAM General - Patient is  resting peacefully in bed, rouses easily to voice Extremity -  splints intact to L ankle and L wrist, able to flex and extend fingers, N/V intact  able to flex and extend toes, sensation intact over exposed foot     Assessment/Plan:    Principal Problem:   Closed left ankle fracture Active Problems:   Fall   Anemia of chronic disease   Protein-calorie malnutrition, severe ([place])   Cancer cachexia ([place])   Prostate cancer metastatic to bone ([place])   Left wrist fracture, closed, initial encounter   Hypocalcemia   Hyperkalemia   Closed fracture of left distal radius  Estimated body mass index is 13.59 kg/m as calculated from the following:   Height as of  this encounter: '6\' 5"'$  (1.956 m).   Weight as of this encounter: 52 kg. Advance diet Up with therapy  NonWB to LLE.  Ace wrap readjusted to LLE.      DVT Prophylaxis - Lovenox NonWeight-Bearing as tolerated to LLE, left wrist  Cassell Smiles, PA-C [facility] Orthopaedic Surgery 06/06/2021, 10:23 AM

## 2021-06-06 NOTE — Progress Notes (Signed)
PROGRESS NOTE    Ryan [name]  [id number] DOB: [birth year] DOA: 06/02/2021 PCP: Pcp, No   Brief Narrative: Taken from H&P. Ryan [name] is a 60 y.o. 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   Patient was recently hospitalized from 6/21-8/24 due to metastasized prostate cancer. Pt was seen by [name] of oncology. He got his last prostate cancer treatment yesterday. He was just discharged from the hospital yesterday. He was supposed to go to a homeless shelter but apparently there were issues when he was transported there. He sates that he he felt dizzy and fell accidentally. No LOC. He injured his left wrist and left ankle, causing severe pain in left ankle and left wrist.    Left wrist x-ray with fracture of distal radius and ulna.  CT left ankle with impacted distal tibial and fibular fractures, mild posterior displacement and angulation.  Orthopedic was consulted and they are recommending conservative management based on his other comorbidities and advanced malignancy.  Patient will be nonweightbearing her left lower extremity.  CT head without any new abnormality.  Prior osseous mets noted.  PT/OT is recommending SNF.  Still pending Medicaid application.  TOC is working on placement.  Subjective: Patient was seen and examined today.  Pain seems well controlled.  No new complaint.  Assessment & Plan:   Principal Problem:   Closed left ankle fracture Active Problems:   Fall   Anemia of chronic disease   Protein-calorie malnutrition, severe (HCC)   Cancer cachexia (HCC)   Prostate cancer metastatic to bone (HCC)   Left wrist fracture, closed, initial encounter   Hypocalcemia   Hyperkalemia   Closed fracture of left distal radius  Closed left ankle fracture and left wrist fracture, closed, initial encounter: consulted [name] of ortho,  given patient's metastasized prostate cancer, recommended conservative treatment.  Patient will be nonweightbearing on left lower extremity and follow-up with orthopedic as an outpatient. -Continue with pain management -PT/OT is recommending SNF.  Anemia of chronic disease.  Hemoglobin stable around 8.2, no obvious bleeding.  Patient also received his chemotherapy a day before. -Continue to monitor -Transfuse if below 7.  Hyperkalemia.  Potassium at 5.3. -Give him Lokelma and monitor  Severe protein caloric malnutrition/cancer cachexia. -Continue with Ensure  Prostate cancer metastatic to bone [facility]): Consulted [name].  Per [name], Pt was seen in hospital in previous [place]. He got his last prostate cancer treatment yesterday, is not due for another 4 weeks; already has out patient follow up scheduled.    Hypocalcemia.  Corrected calcium of 6.9.  Received total of 3 g of calcium gluconate IV. Vitamin D levels more than 200. -Continue with calcium supplement -Continue to monitor  Anxiety and depression. -Continue with hydroxyzine and Remeron  Severe protein caloric malnutrition. Estimated body mass index is 13.59 kg/m as calculated from the following:   Height as of this encounter: '6\' 5"'$  (1.956 m).   Weight as of this encounter: 52 kg.  -Dietitian consult   Objective: Vitals:   06/05/21 1619 06/05/21 1940 06/06/21 0600 06/06/21 0816  BP: 112/64 118/60 122/60 103/74  Pulse: 92 98 87 82  Resp: '19 20 20 18  '$ Temp:  97.8 F (36.6 C) 98.3 F (36.8 C) 98.7 F (37.1 C)  TempSrc:  Oral    SpO2: 96% 99% 99% 100%  Weight:      Height:  Intake/Output Summary (Last 24 hours) at 06/06/2021 1454 Last data filed at 06/06/2021 1122 Gross per 24 hour  Intake --  Output 1900 ml  Net -1900 ml    Filed Weights   06/02/21 1634  Weight: 52 kg    Examination:  General.  Cachectic gentleman, in no acute distress. Pulmonary.  Lungs clear bilaterally, normal  respiratory effort. CV.  Regular rate and rhythm, no JVD, rub or murmur. Abdomen.  Soft, nontender, nondistended, BS positive. CNS.  Alert and oriented .  No focal neurologic deficit. Extremities.  No edema, no cyanosis, pulses intact and symmetrical.  Left upper and lower extremity with Ace wrap. Psychiatry.  Judgment and insight appears normal.   DVT prophylaxis: Lovenox Code Status: DNR Family Communication: No family available.  Patient is homeless Disposition Plan:  Status is: Inpatient  Remains inpatient appropriate because:Inpatient level of care appropriate due to severity of illness  Dispo: The patient is from: Home              Anticipated d/c is to: SNF              Patient currently is medically stable to d/c.   Difficult to place patient Yes              Level of care: Med-Surg  All the records are reviewed and case discussed with Care Management/Social Worker. Management plans discussed with the patient, nursing and they are in agreement.  Consultants:  Orthopedic surgery.  Procedures:  Antimicrobials:   Data Reviewed: I have personally reviewed following labs and imaging studies  CBC: Recent Labs  Lab 06/02/21 1757 06/04/21 0528 06/05/21 0522  WBC 10.2 7.4 7.5  NEUTROABS 8.0*  --   --   HGB 9.5* 8.0* 8.2*  HCT 31.9* 25.7* 26.5*  MCV 94.9 90.8 89.5  PLT 171 155 149*    Basic Metabolic Panel: Recent Labs  Lab 06/02/21 1200 06/02/21 1757 06/02/21 1757 06/02/21 2132 06/03/21 0417 06/04/21 0528 06/05/21 0522 06/05/21 1349 06/06/21 0944  NA  --  138  --  137 137 135 134*  --   --   K  --  5.7*   < > 5.9* 4.5 4.9 5.3* 4.9 5.0  CL  --  108  --  107 102 106 107  --   --   CO2  --  21*  --  23 25 21* 20*  --   --   GLUCOSE  --  95  --  102* 110* 103* 98  --   --   BUN  --  21*  --  20 21* 29* 27*  --   --   CREATININE  --  0.54*  --  0.52* 0.58* 0.51* 0.54*  --   --   CALCIUM  --  7.0*  --  6.6* 6.3* 5.6* 6.5*  --   --   MG 1.6*  --   --   --    --   --   --   --   --   PHOS 4.2  --   --   --   --   --  3.3  --   --    < > = values in this interval not displayed.    GFR: Estimated Creatinine Clearance: 73.1 mL/min (A) (by C-G formula based on SCr of 0.54 mg/dL (L)). Liver Function Tests: Recent Labs  Lab 06/02/21 1757 06/04/21 0528 06/05/21 0522  AST 24 16  --  ALT 22 17  --   ALKPHOS 1,902* 1,475*  --   BILITOT 1.0 0.8  --   PROT 6.5 5.7*  --   ALBUMIN 3.6 3.1* 3.2*    Recent Labs  Lab 06/02/21 1757  LIPASE 151*    No results for input(s): AMMONIA in the last 168 hours. Coagulation Profile: Recent Labs  Lab 06/03/21 1439  INR 1.4*    Cardiac Enzymes: No results for input(s): CKTOTAL, CKMB, CKMBINDEX, TROPONINI in the last 168 hours. BNP (last 3 results) No results for input(s): PROBNP in the last 8760 hours. HbA1C: No results for input(s): HGBA1C in the last 72 hours. CBG: No results for input(s): GLUCAP in the last 168 hours. Lipid Profile: No results for input(s): CHOL, HDL, LDLCALC, TRIG, CHOLHDL, LDLDIRECT in the last 72 hours. Thyroid Function Tests: No results for input(s): TSH, T4TOTAL, FREET4, T3FREE, THYROIDAB in the last 72 hours. Anemia Panel: No results for input(s): VITAMINB12, FOLATE, FERRITIN, TIBC, IRON, RETICCTPCT in the last 72 hours. Sepsis Labs: No results for input(s): PROCALCITON, LATICACIDVEN in the last 168 hours.  Recent Results (from the past 240 hour(s))  Resp Panel by RT-PCR (Flu A&B, Covid) Nasopharyngeal Swab     Status: None   Collection Time: 06/03/21  5:36 AM   Specimen: Nasopharyngeal Swab; Nasopharyngeal(NP) swabs in vial transport medium  Result Value Ref Range Status   SARS Coronavirus 2 by RT PCR NEGATIVE NEGATIVE Final    Comment: (NOTE) SARS-CoV-2 target nucleic acids are NOT DETECTED.  The SARS-CoV-2 RNA is generally detectable in upper respiratory specimens during the acute phase of infection. The lowest concentration of SARS-CoV-2 viral copies this  assay can detect is 138 copies/mL. A negative result does not preclude SARS-Cov-2 infection and should not be used as the sole basis for treatment or other patient management decisions. A negative result may occur with  improper specimen collection/handling, submission of specimen other than nasopharyngeal swab, presence of viral mutation(s) within the areas targeted by this assay, and inadequate number of viral copies(<138 copies/mL). A negative result must be combined with clinical observations, patient history, and epidemiological information. The expected result is Negative.  Fact Sheet for Patients:  EntrepreneurPulse.com.au  Fact Sheet for Healthcare Providers:  IncredibleEmployment.be  This test is no t yet approved or cleared by the [place] FDA and  has been authorized for detection and/or diagnosis of SARS-CoV-2 by FDA under an Emergency Use Authorization (EUA). This EUA will remain  in effect (meaning this test can be used) for the duration of the COVID-19 declaration under Section 564(b)(1) of the Act, 21 U.S.C.section 360bbb-3(b)(1), unless the authorization is terminated  or revoked sooner.       Influenza A by PCR NEGATIVE NEGATIVE Final   Influenza B by PCR NEGATIVE NEGATIVE Final    Comment: (NOTE) The Xpert Xpress SARS-CoV-2/FLU/RSV plus assay is intended as an aid in the diagnosis of influenza from Nasopharyngeal swab specimens and should not be used as a sole basis for treatment. Nasal washings and aspirates are unacceptable for Xpert Xpress SARS-CoV-2/FLU/RSV testing.  Fact Sheet for Patients: EntrepreneurPulse.com.au  Fact Sheet for Healthcare Providers: IncredibleEmployment.be  This test is not yet approved or cleared by the [place] FDA and has been authorized for detection and/or diagnosis of SARS-CoV-2 by FDA under an Emergency Use Authorization (EUA). This EUA will  remain in effect (meaning this test can be used) for the duration of the COVID-19 declaration under Section 564(b)(1) of the Act, 21 U.S.C. section 360bbb-3(b)(1), unless  the authorization is terminated or revoked.  Performed at [facility], [address], [place], [place] [address]       Radiology Studies: No results found.  Scheduled Meds:  calcium carbonate  1,250 mg Oral TID WC   dexamethasone  4 mg Oral Daily   enoxaparin (LOVENOX) injection  40 mg Subcutaneous Q24H   feeding supplement  237 mL Oral QID   mirtazapine  15 mg Oral QHS   multivitamin with minerals  1 tablet Oral Daily   nicotine  21 mg Transdermal Daily   sodium zirconium cyclosilicate  10 g Oral Daily   Continuous Infusions:  sodium chloride Stopped (06/04/21 1216)     LOS: 3 days   Time spent: 30 minutes. More than 50% of the time was spent in counseling/coordination of care  Lorella Nimrod, MD [facility]  If 7PM-7AM, please contact night-coverage [email or web address]  06/06/2021, 2:54 PM   This record has been created using Systems analyst. Errors have been sought and corrected,but may not always be located. Such creation errors do not reflect on the standard of care.

## 2021-06-06 NOTE — TOC Progression Note (Signed)
Transition of Care [facility]) - Progression Note    Patient Details  Name: Ryan [name] MRN: [id number] Date of Birth: 03-19-1961  Transition of Care [facility]) CM/SW [place], RN Phone Number: 06/06/2021, 10:33 AM  Clinical Narrative:     Reached out to [facility] and left a secure voicemail to please review the patient for a bed offer       Expected Discharge Plan and Services                                                 Social Determinants of Health (SDOH) Interventions    Readmission Risk Interventions Readmission Risk Prevention Plan 04/23/2021  Transportation Screening Complete  PCP or Specialist Appt within 3-5 Days Complete  HRI or [place] Complete  Social Work Consult for [place] Planning/Counseling Not Complete  SW consult not completed comments RNCM assigned to patient  Palliative Care Screening Complete  Medication Review Press photographer) Complete  Some recent data might be hidden

## 2021-06-07 LAB — RENAL FUNCTION PANEL
Albumin: 3.2 g/dL — ABNORMAL LOW (ref 3.5–5.0)
Anion gap: 8 (ref 5–15)
BUN: 20 mg/dL (ref 6–20)
CO2: 19 mmol/L — ABNORMAL LOW (ref 22–32)
Calcium: 6.6 mg/dL — ABNORMAL LOW (ref 8.9–10.3)
Chloride: 104 mmol/L (ref 98–111)
Creatinine, Ser: 0.55 mg/dL — ABNORMAL LOW (ref 0.61–1.24)
GFR, Estimated: >60 mL/min (ref >60)
Glucose, Bld: 112 mg/dL — ABNORMAL HIGH (ref 70–99)
Phosphorus: 2.9 mg/dL (ref 2.5–4.6)
Potassium: 4.8 mmol/L (ref 3.5–5.1)
Sodium: 131 mmol/L — ABNORMAL LOW (ref 135–145)

## 2021-06-07 MED ORDER — SODIUM CHLORIDE 0.9 % IV SOLN: INTRAVENOUS | Status: AC

## 2021-06-07 NOTE — TOC Progression Note (Signed)
Transition of Care [facility]) - Progression Note    Patient Details  Name: Ryan [name] MRN: [id number] Date of Birth: [birth year]  Transition of Care [facility]) CM/SW [place], RN Phone Number: 06/07/2021, 10:57 AM  Clinical Narrative:     Reached out to Kittie at [facility] to request a bed offer for LOG, Left a VM requesting a call back     Expected Discharge Plan and Services                                                 Social Determinants of Health (SDOH) Interventions    Readmission Risk Interventions Readmission Risk Prevention Plan 04/23/2021  Transportation Screening Complete  PCP or Specialist Appt within 3-5 Days Complete  HRI or [place] Complete  Social Work Consult for [place] Planning/Counseling Not Complete  SW consult not completed comments RNCM assigned to patient  Palliative Care Screening Complete  Medication Review Press photographer) Complete  Some recent data might be hidden

## 2021-06-07 NOTE — Progress Notes (Signed)
PROGRESS NOTE    Ryan [name]  [id number] DOB: May 30, 1961 DOA: 06/02/2021 PCP: Pcp, No   Brief Narrative: Taken from H&P. Ryan [name] is a 60 y.o. 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   Patient was recently hospitalized from 6/21-8/24 due to metastasized prostate cancer. Pt was seen by [name] of oncology. He got his last prostate cancer treatment yesterday. He was just discharged from the hospital yesterday. He was supposed to go to a homeless shelter but apparently there were issues when he was transported there. He sates that he he felt dizzy and fell accidentally. No LOC. He injured his left wrist and left ankle, causing severe pain in left ankle and left wrist.    Left wrist x-ray with fracture of distal radius and ulna.  CT left ankle with impacted distal tibial and fibular fractures, mild posterior displacement and angulation.  Orthopedic was consulted and they are recommending conservative management based on his other comorbidities and advanced malignancy.  Patient will be nonweightbearing her left lower extremity.  CT head without any new abnormality.  Prior osseous mets noted.  PT/OT is recommending SNF.  Still pending Medicaid application.  TOC is working on placement.  Subjective: Patient was resting comfortably when seen today.  No new complaints.  Assessment & Plan:   Principal Problem:   Closed left ankle fracture Active Problems:   Fall   Anemia of chronic disease   Protein-calorie malnutrition, severe (HCC)   Cancer cachexia (HCC)   Prostate cancer metastatic to bone (HCC)   Left wrist fracture, closed, initial encounter   Hypocalcemia   Hyperkalemia   Closed fracture of left distal radius  Closed left ankle fracture and left wrist fracture, closed, initial encounter: consulted [name] of ortho, given patient's  metastasized prostate cancer, recommended conservative treatment.  Patient will be nonweightbearing on left lower extremity and follow-up with orthopedic as an outpatient. -Continue with pain management -PT/OT is recommending SNF. -TOC is trying-difficult placement  Anemia of chronic disease.  Hemoglobin stable around 8.2, no obvious bleeding.  Patient also received his chemotherapy a day before. -Continue to monitor -Transfuse if below 7.  Hyperkalemia.  Resolved. -Continue to monitor  Severe protein caloric malnutrition/cancer cachexia. -Continue with Ensure  Prostate cancer metastatic to bone [facility]): Consulted [name].  Per [name], Pt was seen in hospital in previous [place]. He got his last prostate cancer treatment yesterday, is not due for another 4 weeks; already has out patient follow up scheduled.    Hypocalcemia.  Corrected calcium of 6.9.  Received total of 3 g of calcium gluconate IV. Vitamin D levels more than 200. -Continue with calcium supplement -Continue to monitor  Anxiety and depression. -Continue with hydroxyzine and Remeron  Severe protein caloric malnutrition. Estimated body mass index is 13.59 kg/m as calculated from the following:   Height as of this encounter: '6\' 5"'$  (1.956 m).   Weight as of this encounter: 52 kg.  -Dietitian consult   Objective: Vitals:   06/06/21 1517 06/06/21 2126 06/07/21 0455 06/07/21 0841  BP: 116/78 113/71 (!) 110/56 109/70  Pulse: 97 97 92 81  Resp: '18  18 16  '$ Temp: 98.6 F (37 C) 98.4 F (36.9 C) 98 F (36.7 C) 98.1 F (36.7 C)  TempSrc:      SpO2: 100% 98% 100% 100%  Weight:      Height:  Intake/Output Summary (Last 24 hours) at 06/07/2021 1533 Last data filed at 06/07/2021 1026 Gross per 24 hour  Intake 240 ml  Output 400 ml  Net -160 ml    Filed Weights   06/02/21 1634  Weight: 52 kg    Examination:  General.  Emaciated gentleman, in no acute distress. Pulmonary.  Lungs clear  bilaterally, normal respiratory effort. CV.  Regular rate and rhythm, no JVD, rub or murmur. Abdomen.  Soft, nontender, nondistended, BS positive. CNS.  Alert and oriented .  No focal neurologic deficit. Extremities.  No edema, no cyanosis, left upper and lower extremity with Ace wrap Psychiatry.  Judgment and insight appears normal.   DVT prophylaxis: Lovenox Code Status: DNR Family Communication: No family available.  Patient is homeless Disposition Plan:  Status is: Inpatient  Remains inpatient appropriate because:Inpatient level of care appropriate due to severity of illness  Dispo: The patient is from: Home              Anticipated d/c is to: SNF              Patient currently is medically stable to d/c.   Difficult to place patient Yes              Level of care: Med-Surg  All the records are reviewed and case discussed with Care Management/Social Worker. Management plans discussed with the patient, nursing and they are in agreement.  Consultants:  Orthopedic surgery.  Procedures:  Antimicrobials:   Data Reviewed: I have personally reviewed following labs and imaging studies  CBC: Recent Labs  Lab 06/02/21 1757 06/04/21 0528 06/05/21 0522  WBC 10.2 7.4 7.5  NEUTROABS 8.0*  --   --   HGB 9.5* 8.0* 8.2*  HCT 31.9* 25.7* 26.5*  MCV 94.9 90.8 89.5  PLT 171 155 149*    Basic Metabolic Panel: Recent Labs  Lab 06/02/21 1200 06/02/21 1757 06/02/21 2132 06/03/21 0417 06/04/21 0528 06/05/21 0522 06/05/21 1349 06/06/21 0944 06/07/21 0438  NA  --    < > 137 137 135 134*  --   --  131*  K  --    < > 5.9* 4.5 4.9 5.3* 4.9 5.0 4.8  CL  --    < > 107 102 106 107  --   --  104  CO2  --    < > 23 25 21* 20*  --   --  19*  GLUCOSE  --    < > 102* 110* 103* 98  --   --  112*  BUN  --    < > 20 21* 29* 27*  --   --  20  CREATININE  --    < > 0.52* 0.58* 0.51* 0.54*  --   --  0.55*  CALCIUM  --    < > 6.6* 6.3* 5.6* 6.5*  --   --  6.6*  MG 1.6*  --   --   --   --    --   --   --   --   PHOS 4.2  --   --   --   --  3.3  --   --  2.9   < > = values in this interval not displayed.    GFR: Estimated Creatinine Clearance: 73.1 mL/min (A) (by C-G formula based on SCr of 0.55 mg/dL (L)). Liver Function Tests: Recent Labs  Lab 06/02/21 1757 06/04/21 0528 06/05/21 0522 06/07/21 0438  AST 24 16  --   --  ALT 22 17  --   --   ALKPHOS 1,902* 1,475*  --   --   BILITOT 1.0 0.8  --   --   PROT 6.5 5.7*  --   --   ALBUMIN 3.6 3.1* 3.2* 3.2*    Recent Labs  Lab 06/02/21 1757  LIPASE 151*    No results for input(s): AMMONIA in the last 168 hours. Coagulation Profile: Recent Labs  Lab 06/03/21 1439  INR 1.4*    Cardiac Enzymes: No results for input(s): CKTOTAL, CKMB, CKMBINDEX, TROPONINI in the last 168 hours. BNP (last 3 results) No results for input(s): PROBNP in the last 8760 hours. HbA1C: No results for input(s): HGBA1C in the last 72 hours. CBG: No results for input(s): GLUCAP in the last 168 hours. Lipid Profile: No results for input(s): CHOL, HDL, LDLCALC, TRIG, CHOLHDL, LDLDIRECT in the last 72 hours. Thyroid Function Tests: No results for input(s): TSH, T4TOTAL, FREET4, T3FREE, THYROIDAB in the last 72 hours. Anemia Panel: No results for input(s): VITAMINB12, FOLATE, FERRITIN, TIBC, IRON, RETICCTPCT in the last 72 hours. Sepsis Labs: No results for input(s): PROCALCITON, LATICACIDVEN in the last 168 hours.  Recent Results (from the past 240 hour(s))  Resp Panel by RT-PCR (Flu A&B, Covid) Nasopharyngeal Swab     Status: None   Collection Time: 06/03/21  5:36 AM   Specimen: Nasopharyngeal Swab; Nasopharyngeal(NP) swabs in vial transport medium  Result Value Ref Range Status   SARS Coronavirus 2 by RT PCR NEGATIVE NEGATIVE Final    Comment: (NOTE) SARS-CoV-2 target nucleic acids are NOT DETECTED.  The SARS-CoV-2 RNA is generally detectable in upper respiratory specimens during the acute phase of infection. The  lowest concentration of SARS-CoV-2 viral copies this assay can detect is 138 copies/mL. A negative result does not preclude SARS-Cov-2 infection and should not be used as the sole basis for treatment or other patient management decisions. A negative result may occur with  improper specimen collection/handling, submission of specimen other than nasopharyngeal swab, presence of viral mutation(s) within the areas targeted by this assay, and inadequate number of viral copies(<138 copies/mL). A negative result must be combined with clinical observations, patient history, and epidemiological information. The expected result is Negative.  Fact Sheet for Patients:  EntrepreneurPulse.com.au  Fact Sheet for Healthcare Providers:  IncredibleEmployment.be  This test is no t yet approved or cleared by the [place] FDA and  has been authorized for detection and/or diagnosis of SARS-CoV-2 by FDA under an Emergency Use Authorization (EUA). This EUA will remain  in effect (meaning this test can be used) for the duration of the COVID-19 declaration under Section 564(b)(1) of the Act, 21 U.S.C.section 360bbb-3(b)(1), unless the authorization is terminated  or revoked sooner.       Influenza A by PCR NEGATIVE NEGATIVE Final   Influenza B by PCR NEGATIVE NEGATIVE Final    Comment: (NOTE) The Xpert Xpress SARS-CoV-2/FLU/RSV plus assay is intended as an aid in the diagnosis of influenza from Nasopharyngeal swab specimens and should not be used as a sole basis for treatment. Nasal washings and aspirates are unacceptable for Xpert Xpress SARS-CoV-2/FLU/RSV testing.  Fact Sheet for Patients: EntrepreneurPulse.com.au  Fact Sheet for Healthcare Providers: IncredibleEmployment.be  This test is not yet approved or cleared by the [place] FDA and has been authorized for detection and/or diagnosis of SARS-CoV-2 by FDA under  an Emergency Use Authorization (EUA). This EUA will remain in effect (meaning this test can be used) for the duration of the  COVID-19 declaration under Section 564(b)(1) of the Act, 21 U.S.C. section 360bbb-3(b)(1), unless the authorization is terminated or revoked.  Performed at [facility], [address], [place], [place] [address]       Radiology Studies: No results found.  Scheduled Meds:  calcium carbonate  1,250 mg Oral TID WC   dexamethasone  4 mg Oral Daily   enoxaparin (LOVENOX) injection  40 mg Subcutaneous Q24H   feeding supplement  237 mL Oral QID   mirtazapine  15 mg Oral QHS   multivitamin with minerals  1 tablet Oral Daily   nicotine  21 mg Transdermal Daily   sodium zirconium cyclosilicate  10 g Oral Daily   Continuous Infusions:  sodium chloride Stopped (06/04/21 1216)   sodium chloride 100 mL/hr at 06/07/21 0845     LOS: 4 days   Time spent: 28 minutes. More than 50% of the time was spent in counseling/coordination of care  Lorella Nimrod, MD [facility]  If 7PM-7AM, please contact night-coverage [email or web address]  06/07/2021, 3:33 PM   This record has been created using Systems analyst. Errors have been sought and corrected,but may not always be located. Such creation errors do not reflect on the standard of care.

## 2021-06-08 LAB — CALCITRIOL (1,25 DI-OH VIT D): Vit D, 1,25-Dihydroxy: 243.9 pg/mL — ABNORMAL HIGH (ref 24.8–81.5)

## 2021-06-08 NOTE — Progress Notes (Signed)
PROGRESS NOTE    Ryan [name]  [id number] DOB: 07/06/1961 DOA: 06/02/2021 PCP: Pcp, No   Brief Narrative: Taken from H&P. Ryan [name] is a 60 y.o. 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   Patient was recently hospitalized from 6/21-8/24 due to metastasized prostate cancer. Pt was seen by [name] of oncology. He got his last prostate cancer treatment yesterday. He was just discharged from the hospital yesterday. He was supposed to go to a homeless shelter but apparently there were issues when he was transported there. He sates that he he felt dizzy and fell accidentally. No LOC. He injured his left wrist and left ankle, causing severe pain in left ankle and left wrist.    Left wrist x-ray with fracture of distal radius and ulna.  CT left ankle with impacted distal tibial and fibular fractures, mild posterior displacement and angulation.  Orthopedic was consulted and they are recommending conservative management based on his other comorbidities and advanced malignancy.  Patient will be nonweightbearing her left lower extremity.  CT head without any new abnormality.  Prior osseous mets noted.  PT/OT is recommending SNF.  Still pending Medicaid application.  TOC is working on placement.  Subjective: Patient was complaining of numbness in his buttock areas bilaterally.  No incontinence.  He was lying little upright in bed and refusing to sit in chair.  No apparent focal deficit.  Assessment & Plan:   Principal Problem:   Closed left ankle fracture Active Problems:   Fall   Anemia of chronic disease   Protein-calorie malnutrition, severe (HCC)   Cancer cachexia (HCC)   Prostate cancer metastatic to bone (HCC)   Left wrist fracture, closed, initial encounter   Hypocalcemia   Hyperkalemia   Closed fracture of left distal radius  Closed left  ankle fracture and left wrist fracture, closed, initial encounter: consulted [name] of ortho, given patient's metastasized prostate cancer, recommended conservative treatment.  Patient will be nonweightbearing on left lower extremity and follow-up with orthopedic as an outpatient. -Continue with pain management -PT/OT is recommending SNF. -TOC is trying-difficult placement -Asked nursing staff to encourage sitting in chair.  Anemia of chronic disease.  Hemoglobin stable around 8.2, no obvious bleeding.  Patient also received his chemotherapy a day before. -Continue to monitor -Transfuse if below 7.  Hyperkalemia.  Resolved. -Continue to monitor  Severe protein caloric malnutrition/cancer cachexia. -Continue with Ensure  Prostate cancer metastatic to bone [facility]): Consulted [name].  Per [name], Pt was seen in hospital in previous [place]. He got his last prostate cancer treatment yesterday, is not due for another 4 weeks; already has out patient follow up scheduled.    Hypocalcemia.  Corrected calcium of 6.9.  Received total of 3 g of calcium gluconate IV. Vitamin D levels more than 200. -Continue with calcium supplement -Continue to monitor  Anxiety and depression. -Continue with hydroxyzine and Remeron  Severe protein caloric malnutrition. Estimated body mass index is 13.59 kg/m as calculated from the following:   Height as of this encounter: '6\' 5"'$  (1.956 m).   Weight as of this encounter: 52 kg.  -Dietitian consult   Objective: Vitals:   06/07/21 1612 06/07/21 2036 06/08/21 0628 06/08/21 0959  BP: 105/68 109/71 127/73 104/64  Pulse: 99 100 87 95  Resp: '15 18 18 19  '$ Temp: 98.3 F (36.8 C) 97.9 F (36.6 C) 98.5 F (36.9  C) 98.3 F (36.8 C)  TempSrc:      SpO2: 98% 98% 100% 98%  Weight:      Height:        Intake/Output Summary (Last 24 hours) at 06/08/2021 1505 Last data filed at 06/08/2021 0501 Gross per 24 hour  Intake 1924.01 ml  Output 500 ml   Net 1424.01 ml    Filed Weights   06/02/21 1634  Weight: 52 kg    Examination:  General.  Cachectic gentleman, in no acute distress. Pulmonary.  Lungs clear bilaterally, normal respiratory effort. CV.  Regular rate and rhythm, no JVD, rub or murmur. Abdomen.  Soft, nontender, nondistended, BS positive. CNS.  Alert and oriented .  No focal neurologic deficit. Extremities.  No edema, no cyanosis, left upper and lower extremity with Ace wrap Psychiatry.  Judgment and insight appears normal.   DVT prophylaxis: Lovenox Code Status: DNR Family Communication: No family available.  Patient is homeless Disposition Plan:  Status is: Inpatient  Remains inpatient appropriate because:Inpatient level of care appropriate due to severity of illness  Dispo: The patient is from: Home              Anticipated d/c is to: SNF              Patient currently is medically stable to d/c.   Difficult to place patient Yes              Level of care: Med-Surg  All the records are reviewed and case discussed with Care Management/Social Worker. Management plans discussed with the patient, nursing and they are in agreement.  Consultants:  Orthopedic surgery.  Procedures:  Antimicrobials:   Data Reviewed: I have personally reviewed following labs and imaging studies  CBC: Recent Labs  Lab 06/02/21 1757 06/04/21 0528 06/05/21 0522  WBC 10.2 7.4 7.5  NEUTROABS 8.0*  --   --   HGB 9.5* 8.0* 8.2*  HCT 31.9* 25.7* 26.5*  MCV 94.9 90.8 89.5  PLT 171 155 149*    Basic Metabolic Panel: Recent Labs  Lab 06/02/21 1200 06/02/21 1757 06/02/21 2132 06/03/21 0417 06/04/21 0528 06/05/21 0522 06/05/21 1349 06/06/21 0944 06/07/21 0438  NA  --    < > 137 137 135 134*  --   --  131*  K  --    < > 5.9* 4.5 4.9 5.3* 4.9 5.0 4.8  CL  --    < > 107 102 106 107  --   --  104  CO2  --    < > 23 25 21* 20*  --   --  19*  GLUCOSE  --    < > 102* 110* 103* 98  --   --  112*  BUN  --    < > 20 21* 29*  27*  --   --  20  CREATININE  --    < > 0.52* 0.58* 0.51* 0.54*  --   --  0.55*  CALCIUM  --    < > 6.6* 6.3* 5.6* 6.5*  --   --  6.6*  MG 1.6*  --   --   --   --   --   --   --   --   PHOS 4.2  --   --   --   --  3.3  --   --  2.9   < > = values in this interval not displayed.    GFR: Estimated Creatinine Clearance: 73.1  mL/min (A) (by C-G formula based on SCr of 0.55 mg/dL (L)). Liver Function Tests: Recent Labs  Lab 06/02/21 1757 06/04/21 0528 06/05/21 0522 06/07/21 0438  AST 24 16  --   --   ALT 22 17  --   --   ALKPHOS 1,902* 1,475*  --   --   BILITOT 1.0 0.8  --   --   PROT 6.5 5.7*  --   --   ALBUMIN 3.6 3.1* 3.2* 3.2*    Recent Labs  Lab 06/02/21 1757  LIPASE 151*    No results for input(s): AMMONIA in the last 168 hours. Coagulation Profile: Recent Labs  Lab 06/03/21 1439  INR 1.4*    Cardiac Enzymes: No results for input(s): CKTOTAL, CKMB, CKMBINDEX, TROPONINI in the last 168 hours. BNP (last 3 results) No results for input(s): PROBNP in the last 8760 hours. HbA1C: No results for input(s): HGBA1C in the last 72 hours. CBG: No results for input(s): GLUCAP in the last 168 hours. Lipid Profile: No results for input(s): CHOL, HDL, LDLCALC, TRIG, CHOLHDL, LDLDIRECT in the last 72 hours. Thyroid Function Tests: No results for input(s): TSH, T4TOTAL, FREET4, T3FREE, THYROIDAB in the last 72 hours. Anemia Panel: No results for input(s): VITAMINB12, FOLATE, FERRITIN, TIBC, IRON, RETICCTPCT in the last 72 hours. Sepsis Labs: No results for input(s): PROCALCITON, LATICACIDVEN in the last 168 hours.  Recent Results (from the past 240 hour(s))  Resp Panel by RT-PCR (Flu A&B, Covid) Nasopharyngeal Swab     Status: None   Collection Time: 06/03/21  5:36 AM   Specimen: Nasopharyngeal Swab; Nasopharyngeal(NP) swabs in vial transport medium  Result Value Ref Range Status   SARS Coronavirus 2 by RT PCR NEGATIVE NEGATIVE Final    Comment: (NOTE) SARS-CoV-2 target  nucleic acids are NOT DETECTED.  The SARS-CoV-2 RNA is generally detectable in upper respiratory specimens during the acute phase of infection. The lowest concentration of SARS-CoV-2 viral copies this assay can detect is 138 copies/mL. A negative result does not preclude SARS-Cov-2 infection and should not be used as the sole basis for treatment or other patient management decisions. A negative result may occur with  improper specimen collection/handling, submission of specimen other than nasopharyngeal swab, presence of viral mutation(s) within the areas targeted by this assay, and inadequate number of viral copies(<138 copies/mL). A negative result must be combined with clinical observations, patient history, and epidemiological information. The expected result is Negative.  Fact Sheet for Patients:  EntrepreneurPulse.com.au  Fact Sheet for Healthcare Providers:  IncredibleEmployment.be  This test is no t yet approved or cleared by the [place] FDA and  has been authorized for detection and/or diagnosis of SARS-CoV-2 by FDA under an Emergency Use Authorization (EUA). This EUA will remain  in effect (meaning this test can be used) for the duration of the COVID-19 declaration under Section 564(b)(1) of the Act, 21 U.S.C.section 360bbb-3(b)(1), unless the authorization is terminated  or revoked sooner.       Influenza A by PCR NEGATIVE NEGATIVE Final   Influenza B by PCR NEGATIVE NEGATIVE Final    Comment: (NOTE) The Xpert Xpress SARS-CoV-2/FLU/RSV plus assay is intended as an aid in the diagnosis of influenza from Nasopharyngeal swab specimens and should not be used as a sole basis for treatment. Nasal washings and aspirates are unacceptable for Xpert Xpress SARS-CoV-2/FLU/RSV testing.  Fact Sheet for Patients: EntrepreneurPulse.com.au  Fact Sheet for Healthcare  Providers: IncredibleEmployment.be  This test is not yet approved or cleared by the  [place] Architectural technologist and has been authorized for detection and/or diagnosis of SARS-CoV-2 by FDA under an Print production planner (EUA). This EUA will remain in effect (meaning this test can be used) for the duration of the COVID-19 declaration under Section 564(b)(1) of the Act, 21 U.S.C. section 360bbb-3(b)(1), unless the authorization is terminated or revoked.  Performed at [facility], [address] [place], [place] [address]       Radiology Studies: No results found.  Scheduled Meds:  calcium carbonate  1,250 mg Oral TID WC   dexamethasone  4 mg Oral Daily   enoxaparin (LOVENOX) injection  40 mg Subcutaneous Q24H   feeding supplement  237 mL Oral QID   mirtazapine  15 mg Oral QHS   multivitamin with minerals  1 tablet Oral Daily   nicotine  21 mg Transdermal Daily   sodium zirconium cyclosilicate  10 g Oral Daily   Continuous Infusions:  sodium chloride Stopped (06/04/21 1216)     LOS: 5 days   Time spent: 28 minutes. More than 50% of the time was spent in counseling/coordination of care  Lorella Nimrod, MD [facility]  If 7PM-7AM, please contact night-coverage [email or web address]  06/08/2021, 3:05 PM   This record has been created using Systems analyst. Errors have been sought and corrected,but may not always be located. Such creation errors do not reflect on the standard of care.

## 2021-06-08 NOTE — Progress Notes (Signed)
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  Pt recently here for multiple months with difficult d/c planning, did ultimatley d/c to homeless shelter and was gone only a few hours before fall with L ankle and wrist fractures with return to the ED.    PT Comments    Pt was supine in bed upon arriving. He agrees to session and OOB activity with max encouragement. Pt was able to transition from supine to short sit EOB with min assist + constant vcs. Pt is aware of NWB restrictions but needs reminders throughout. Unable to stand from EOB while adhering to proper wt bearing restrictions. Was able to perform sideboard in/out of w/c with mod assist + vcs. Self propelled w/c with +1 arm/+1 LE. Needs longer elevating leg rest for better support on LLE. Overall tolerated session well. Acute PT will continue to follow and progress as able per current POC.    Follow Up Recommendations  SNF;Supervision/Assistance - 24 hour     Equipment Recommendations  Wheelchair (measurements PT)       Precautions / Restrictions Precautions Precautions: Fall Required Braces or Orthoses: Splint/Cast Splint/Cast: L ankle/LUE Restrictions Weight Bearing Restrictions: Yes LUE Weight Bearing: Non weight bearing LLE Weight Bearing: Non weight bearing    Mobility  Bed Mobility Overal bed mobility: Needs Assistance Bed Mobility: Supine to Sit Rolling: Min assist Sidelying to sit: Min assist Supine to sit: Min assist Sit to supine: Supervision   General bed mobility comments: Pt requires min assit to achieve EOB short sit and min assist to return and reposition while adhereing to NWB     Transfers Overall transfer level: Needs assistance Equipment used: Rolling walker (2 wheeled) Transfers:  (slideboard transfer)     Lateral/Scoot Transfers: Min assist;Max assist;With slide board General transfer comment: pt performed slideboard out then back into bed with max vcs for technique and to avoid wt bearing through LLE.  Ambulation/Gait      General Gait Details: unable due to wt bearing restrictions    Balance Overall balance assessment: Needs assistance Sitting-balance support: No upper extremity supported;Feet supported Sitting balance-Leahy Scale: Good     Standing balance support: Bilateral upper extremity supported;During functional activity Standing balance-Leahy Scale: Zero Standing balance comment: pt unable to maintain proper wt bearing.      Cognition Arousal/Alertness: Awake/alert Behavior During Therapy: WFL for tasks assessed/performed Overall Cognitive Status: Within Functional Limits for tasks assessed    General Comments: Pt  was cooperative this session. eager to learn how to properly uise slideboard out/in bed             Pertinent Vitals/Pain Pain Assessment: 0-10 Pain Score: 5  Faces Pain Scale: Hurts even more Pain Location: L ankle, less so in wrist Pain Descriptors / Indicators: Numbness Pain Intervention(s): Limited activity within patient's tolerance;Monitored during session;Premedicated before session;Repositioned     PT Goals (current goals can now be found in the care plan section) Acute Rehab PT Goals Patient Stated Goal: I want to be able to move again Progress towards PT goals: Progressing toward goals    Frequency    Min 2X/week      PT Plan Current plan remains appropriate  Co-evaluation     PT goals addressed during session: Mobility/safety with mobility;Balance;Proper use of DME;Strengthening/ROM        AM-PAC PT "6 Clicks" Mobility   Outcome Measure  Help needed turning from your back to your  side while in a flat bed without using bedrails?: A Little Help needed moving from lying on your back to sitting on the side of a flat bed without using bedrails?: A Little Help needed moving to and from a bed to a chair (including a wheelchair)?: Total Help needed standing up from a chair using your arms (e.g., wheelchair or bedside chair)?: Total Help needed to walk in hospital room?: Total Help needed climbing 3-5 steps with a railing? : Total 6 Click Score: 10    End of Session Equipment Utilized During Treatment: Gait belt Activity Tolerance: Patient tolerated treatment well;Patient limited by fatigue Patient left: in bed Nurse Communication: Mobility status PT Visit Diagnosis: Unsteadiness on feet (R26.81);Muscle weakness (generalized) (M62.81);Difficulty in walking, not elsewhere classified (R26.2);History of falling (Z91.81);Pain Pain - Right/Left: Left Pain - part of body: Ankle and joints of foot     Time: 1212-1230 PT Time Calculation (min) (ACUTE ONLY): 18 min  Charges:  $Therapeutic Activity: 8-22 mins                     Julaine Fusi PTA 06/08/21, 2:43 PM

## 2021-06-08 NOTE — TOC Progression Note (Signed)
Transition of Care [facility]) - Progression Note    Patient Details  Name: Ryan [name] MRN: [id number] Date of Birth: 29-Jun-1961  Transition of Care [facility]) CM/SW [place], RN Phone Number: 06/08/2021, 2:15 PM  Clinical Narrative:     Patient's cousin [name] called and wanted an update, I explained that there is no bed offer yet, he asked if disability has been approved yet, I explained that it is still pending, he asked if Medicaid is approved yet, I explained it is still pending. He stated he will continue to check on him       Expected Discharge Plan and Services                                                 Social Determinants of Health (SDOH) Interventions    Readmission Risk Interventions Readmission Risk Prevention Plan 04/23/2021  Transportation Screening Complete  PCP or Specialist Appt within 3-5 Days Complete  HRI or [place] Complete  Social Work Consult for [place] Planning/Counseling Not Complete  SW consult not completed comments RNCM assigned to patient  Palliative Care Screening Complete  Medication Review Press photographer) Complete  Some recent data might be hidden

## 2021-06-08 NOTE — Plan of Care (Signed)

## 2021-06-08 NOTE — TOC Progression Note (Signed)
Transition of Care [facility]) - Progression Note    Patient Details  Name: Ryan [name] MRN: [id number] Date of Birth: 26-Apr-1961  Transition of Care [facility]) CM/SW [place], RN Phone Number: 06/08/2021, 9:58 AM  Clinical Narrative:    No bed offers at this time        Expected Discharge Plan and Services                                                 Social Determinants of Health (SDOH) Interventions    Readmission Risk Interventions Readmission Risk Prevention Plan 04/23/2021  Transportation Screening Complete  PCP or Specialist Appt within 3-5 Days Complete  HRI or [place] Complete  Social Work Consult for [place] Planning/Counseling Not Complete  SW consult not completed comments RNCM assigned to patient  Palliative Care Screening Complete  Medication Review Press photographer) Complete  Some recent data might be hidden

## 2021-06-08 NOTE — TOC Progression Note (Signed)
Transition of Care [facility]) - Progression Note    Patient Details  Name: Ryan [name] MRN: [id number] Date of Birth: 08/28/1961  Transition of Care [facility]) CM/SW [place], RN Phone Number: 06/08/2021, 3:57 PM  Clinical Narrative:     Checked with [employer] for manual wjheelchair 18x18 with articulating leg rests or 18X12, they only have 18X16, Checked with Jermaine at [facility] to see if they had the 18X18 or 18X20, he will check and let me know       Expected Discharge Plan and Services                                                 Social Determinants of Health (SDOH) Interventions    Readmission Risk Interventions Readmission Risk Prevention Plan 04/23/2021  Transportation Screening Complete  PCP or Specialist Appt within 3-5 Days Complete  HRI or [place] Complete  Social Work Consult for [place] Planning/Counseling Not Complete  SW consult not completed comments RNCM assigned to patient  Palliative Care Screening Complete  Medication Review Press photographer) Complete  Some recent data might be hidden

## 2021-06-09 LAB — RENAL FUNCTION PANEL
Albumin: 3.2 g/dL — ABNORMAL LOW (ref 3.5–5.0)
Anion gap: 8 (ref 5–15)
BUN: 28 mg/dL — ABNORMAL HIGH (ref 6–20)
CO2: 20 mmol/L — ABNORMAL LOW (ref 22–32)
Calcium: 6.7 mg/dL — ABNORMAL LOW (ref 8.9–10.3)
Chloride: 104 mmol/L (ref 98–111)
Creatinine, Ser: 0.52 mg/dL — ABNORMAL LOW (ref 0.61–1.24)
GFR, Estimated: >60 mL/min (ref >60)
Glucose, Bld: 105 mg/dL — ABNORMAL HIGH (ref 70–99)
Phosphorus: 3.8 mg/dL (ref 2.5–4.6)
Potassium: 5 mmol/L (ref 3.5–5.1)
Sodium: 132 mmol/L — ABNORMAL LOW (ref 135–145)

## 2021-06-09 MED ORDER — TRAZODONE HCL 100 MG PO TABS
100.0000 mg | ORAL_TABLET | Freq: Every evening | ORAL | Status: DC | PRN
  Administered 2021-06-09 – 2021-06-15 (×5): 100 mg via ORAL
  Filled 2021-06-09 (×5): qty 1

## 2021-06-09 NOTE — Plan of Care (Signed)
Patient sleeping between care. Oriented x4. Nausea ongoing though pt asks for snacks/drinks frequently. No new changes in assessment. Call bell within reach.   PLAN OF CARE ONGOING Problem: Education: Goal: Knowledge of General Education information will improve Description: Including pain rating scale, medication(s)/side effects and non-pharmacologic comfort measures Outcome: Progressing   Problem: Health Behavior/Discharge Planning: Goal: Ability to manage health-related needs will improve Outcome: Progressing   Problem: Clinical Measurements: Goal: Ability to maintain clinical measurements within normal limits will improve Outcome: Progressing Goal: Will remain free from infection Outcome: Progressing Goal: Diagnostic test results will improve Outcome: Progressing Goal: Respiratory complications will improve Outcome: Progressing Goal: Cardiovascular complication will be avoided Outcome: Progressing   Problem: Activity: Goal: Risk for activity intolerance will decrease Outcome: Progressing   Problem: Nutrition: Goal: Adequate nutrition will be maintained Outcome: Progressing   Problem: Coping: Goal: Level of anxiety will decrease Outcome: Progressing   Problem: Elimination: Goal: Will not experience complications related to bowel motility Outcome: Progressing Goal: Will not experience complications related to urinary retention Outcome: Progressing   Problem: Pain Managment: Goal: General experience of comfort will improve Outcome: Progressing   Problem: Safety: Goal: Ability to remain free from injury will improve Outcome: Progressing   Problem: Skin Integrity: Goal: Risk for impaired skin integrity will decrease Outcome: Progressing   Problem: Education: Goal: Knowledge of the prescribed therapeutic regimen will improve Outcome: Progressing   Problem: Activity: Goal: Ability to increase mobility will improve Outcome: Progressing   Problem: Pain  Management: Goal: Pain level will decrease with appropriate interventions Outcome: Progressing

## 2021-06-09 NOTE — Progress Notes (Signed)
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  Pt recently here for multiple months with difficult d/c planning, did ultimatley d/c to homeless shelter and was gone only a few hours before fall with L ankle and wrist fractures with return to the ED.   OT comments  Ryan [name] presented today with generalized weakness, very limited endurance, flat affect, LUE and LLE pain, as well as RLE numbness. Pt participated in UB and LB dressing, requiring Mod-Max A for donning shirt and pants; pt was able to use his R arm to don a sock on R foot w/o any physical assistance. Pt reluctant to attempt bed mobility, standing, transfers, but, w/ much persuasion, was able to move supine<sit<stand<recliner, needing Max A +2 for safety while maintaining WB precautions. As of 2:30 PM, pt had eaten nothing today besides graham crackers. Assisted pt with opening food containers; he was then able to self-feed w/ RUE.   Follow Up Recommendations  SNF    Equipment Recommendations  None recommended by OT    Recommendations for Other Services      Precautions / Restrictions Precautions Precautions: Fall Required Braces or Orthoses: Splint/Cast Spinal Brace Comments: pt no longer using TLSO brace Splint/Cast: L ankle/LUE Restrictions Weight Bearing Restrictions: Yes LUE Weight Bearing: Non weight bearing LLE Weight Bearing: Non weight bearing       Mobility Bed Mobility Overal bed mobility: Needs Assistance Bed Mobility: Supine to Sit Rolling: Min assist   Supine to sit: Mod assist          Transfers Overall transfer level: Needs assistance Equipment used: Rolling walker (2  wheeled)   Sit to Stand: Mod assist;+2 physical assistance Stand pivot transfers: Max assist;+2 physical assistance       General transfer comment: MaxA +2 for sit<stand and stand-pivot to recliner, w/ WB on R LE, R UE only on RW    Balance Overall balance assessment: Needs assistance Sitting-balance support: Single extremity supported;Feet supported Sitting balance-Leahy Scale: Fair   Postural control: Left lateral lean Standing balance support: Single extremity supported;During functional activity Standing balance-Leahy Scale: Poor                             ADL either performed or assessed with clinical judgement   ADL Overall ADL's : Needs assistance/impaired Eating/Feeding: Set up;Minimal assistance Eating/Feeding Details (indicate cue type and reason): required assistance for opening containers, as well as repeated encouragement to eat             Upper Body Dressing : Maximal assistance;Minimal assistance Upper Body Dressing Details (indicate cue type and reason): Min A for threading L UE through sleeve; Max A for fastening buttons Lower Body Dressing: Moderate assistance;Supervision/safety Lower Body Dressing Details (indicate cue type and reason): Requires Mod A for threading LLE and for pulling up pants; SUPV for donning sock on R foot, using R LE only     Toileting- Clothing Manipulation and Hygiene: Maximal assistance Toileting - Clothing Manipulation Details (indicate cue type and reason): Pt had BM in bed, unable/unwilling to participate in clean-up             Vision  Perception     Praxis      Cognition Arousal/Alertness: Awake/alert Behavior During Therapy: Flat affect Overall Cognitive Status: Within Functional Limits for tasks assessed                                 General Comments: Required a great deal of encouragement/cajoling to participate        Exercises Other Exercises Other Exercises: Bed  mobility, transfers, UB and LB dressing, toileting, sitting balance tolerance   Shoulder Instructions       General Comments      Pertinent Vitals/ Pain       Faces Pain Scale: Hurts even more Pain Location: L ankle, L wrist. Complains of numbness in b/l LE Pain Intervention(s): Monitored during session;Limited activity within patient's tolerance;Repositioned  Home Living                                          Prior Functioning/Environment              Frequency  Min 1X/week        Progress Toward Goals  OT Goals(current goals can now be found in the care plan section)  Progress towards OT goals: Progressing toward goals  Acute Rehab OT Goals Patient Stated Goal: I want to be able to move again OT Goal Formulation: With patient Time For Goal Achievement: 06/18/21 Potential to Achieve Goals: Good  Plan Frequency remains appropriate;Discharge plan remains appropriate    Co-evaluation                 AM-PAC OT "6 Clicks" Daily Activity     Outcome Measure   Help from another person eating meals?: A Little Help from another person taking care of personal grooming?: A Little Help from another person toileting, which includes using toliet, bedpan, or urinal?: A Lot Help from another person bathing (including washing, rinsing, drying)?: A Lot Help from another person to put on and taking off regular upper body clothing?: A Lot Help from another person to put on and taking off regular lower body clothing?: A Lot 6 Click Score: 14    End of Session Equipment Utilized During Treatment: Rolling walker;Gait belt  OT Visit Diagnosis: Unsteadiness on feet (R26.81);Muscle weakness (generalized) (M62.81);Adult, failure to thrive (R62.7) Pain - Right/Left: Left Pain - part of body: Leg;Arm;Hand   Activity Tolerance Patient limited by lethargy   Patient Left in chair;with call bell/phone within reach;with chair alarm set;with nursing/sitter in  room   Nurse Communication          Time: [id number] OT Time Calculation (min): 47 min  Charges: OT General Charges $OT Visit: 1 Visit OT Treatments $Self Care/Home Management : 38-52 mins  Josiah Lobo, PhD, MS, OTR/L 06/09/21, 3:10 PM

## 2021-06-09 NOTE — Plan of Care (Signed)

## 2021-06-09 NOTE — Progress Notes (Signed)
PROGRESS NOTE    Ryan [name]  [id number] DOB: August 24, 1961 DOA: 06/02/2021 PCP: Pcp, No   Brief Narrative: Taken from H&P. Ryan [name] is a 60 y.o. 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   Patient was recently hospitalized from 6/21-8/24 due to metastasized prostate cancer. Pt was seen by [name] of oncology. He got his last prostate cancer treatment yesterday. He was just discharged from the hospital yesterday. He was supposed to go to a homeless shelter but apparently there were issues when he was transported there. He sates that he he felt dizzy and fell accidentally. No LOC. He injured his left wrist and left ankle, causing severe pain in left ankle and left wrist.    Left wrist x-ray with fracture of distal radius and ulna.  CT left ankle with impacted distal tibial and fibular fractures, mild posterior displacement and angulation.  Orthopedic was consulted and they are recommending conservative management based on his other comorbidities and advanced malignancy.  Patient will be nonweightbearing her left lower extremity.  CT head without any new abnormality.  Prior osseous mets noted.  PT/OT is recommending SNF.  Still pending Medicaid application.  TOC is working on placement.  Subjective: Pt reported pain controlled.  Complained of insomnia, and asked for trazodone to be increased.     Assessment & Plan:   Principal Problem:   Closed left ankle fracture Active Problems:   Fall   Anemia of chronic disease   Protein-calorie malnutrition, severe (HCC)   Cancer cachexia (HCC)   Prostate cancer metastatic to bone (HCC)   Left wrist fracture, closed, initial encounter   Hypocalcemia   Hyperkalemia   Closed fracture of left distal radius  Closed left ankle fracture and left wrist fracture, closed, initial encounter: consulted [name]  of ortho, given patient's metastasized prostate cancer, recommended conservative treatment.  Patient will be nonweightbearing on left lower extremity and follow-up with orthopedic as an outpatient. -Continue with pain management -PT/OT is recommending SNF. -TOC is trying-difficult placement -Asked nursing staff to encourage sitting in chair.  Anemia of chronic disease.  Hemoglobin stable around 8.2, no obvious bleeding.  Patient also received his chemotherapy a day before. -Continue to monitor -Transfuse if below 7.  Hyperkalemia.  Resolved. -Continue to monitor  Severe protein caloric malnutrition/cancer cachexia. -Continue with Ensure  Prostate cancer metastatic to bone [facility]): Consulted [name].  Per [name], Pt was seen in hospital in previous [place]. He got his last prostate cancer treatment yesterday, is not due for another 4 weeks; already has out patient follow up scheduled.    Hypocalcemia.   Corrected calcium of 6.9.  Received total of 3 g of calcium gluconate IV. Vitamin D levels more than 200. -Continue with calcium supplement -Continue to monitor  Anxiety and depression. -Continue with hydroxyzine and Remeron  Severe protein caloric malnutrition. Estimated body mass index is 13.59 kg/m as calculated from the following:   Height as of this encounter: '6\' 5"'$  (1.956 m).   Weight as of this encounter: 52 kg.  -Dietitian consult   Objective: Vitals:   06/08/21 1942 06/09/21 0510 06/09/21 0800 06/09/21 1715  BP: 108/65 108/72 105/64 121/62  Pulse: (!) 101 90 91 98  Resp: '16 16 17 16  '$ Temp: 98.4 F (36.9 C) 99.1 F (37.3 C) 97.9 F (36.6 C) 98.6 F (37 C)  TempSrc:  SpO2: 96% 100% 98% 100%  Weight:      Height:        Intake/Output Summary (Last 24 hours) at 06/09/2021 1835 Last data filed at 06/09/2021 0300 Gross per 24 hour  Intake --  Output 350 ml  Net -350 ml   Filed Weights   06/02/21 1634  Weight: 52 kg     Examination:  Constitutional: NAD, AAOx3 HEENT: conjunctivae and lids normal, EOMI CV: No cyanosis.   RESP: normal respiratory effort, on RA Extremities: left arm and left leg in splints SKIN: warm, dry Neuro: II - XII grossly intact.   Psych: Normal mood and affect.     DVT prophylaxis: Lovenox Code Status: DNR Family Communication: Disposition Plan:  Status is: Inpatient  Remains inpatient appropriate because:Inpatient level of care appropriate due to severity of illness  Dispo: The patient is from: Home              Anticipated d/c is to: SNF              Patient currently is medically stable to d/c.   Difficult to place patient Yes              Level of care: Med-Surg  All the records are reviewed and case discussed with Care Management/Social Worker. Management plans discussed with the patient, nursing and they are in agreement.  Consultants:  Orthopedic surgery.  Procedures:  Antimicrobials:   Data Reviewed: I have personally reviewed following labs and imaging studies  CBC: Recent Labs  Lab 06/04/21 0528 06/05/21 0522  WBC 7.4 7.5  HGB 8.0* 8.2*  HCT 25.7* 26.5*  MCV 90.8 89.5  PLT 155 [id number]*   Basic Metabolic Panel: Recent Labs  Lab 06/03/21 0417 06/04/21 0528 06/05/21 0522 06/05/21 1349 06/06/21 0944 06/07/21 0438 06/09/21 0430  NA 137 135 134*  --   --  131* 132*  K 4.5 4.9 5.3* 4.9 5.0 4.8 5.0  CL 102 106 107  --   --  104 104  CO2 25 21* 20*  --   --  19* 20*  GLUCOSE 110* 103* 98  --   --  112* 105*  BUN 21* 29* 27*  --   --  20 28*  CREATININE 0.58* 0.51* 0.54*  --   --  0.55* 0.52*  CALCIUM 6.3* 5.6* 6.5*  --   --  6.6* 6.7*  PHOS  --   --  3.3  --   --  2.9 3.8   GFR: Estimated Creatinine Clearance: 73.1 mL/min (A) (by C-G formula based on SCr of 0.52 mg/dL (L)). Liver Function Tests: Recent Labs  Lab 06/04/21 0528 06/05/21 0522 06/07/21 0438 06/09/21 0430  AST 16  --   --   --   ALT 17  --   --   --   ALKPHOS 1,475*   --   --   --   BILITOT 0.8  --   --   --   PROT 5.7*  --   --   --   ALBUMIN 3.1* 3.2* 3.2* 3.2*   No results for input(s): LIPASE, AMYLASE in the last 168 hours. No results for input(s): AMMONIA in the last 168 hours. Coagulation Profile: Recent Labs  Lab 06/03/21 1439  INR 1.4*   Cardiac Enzymes: No results for input(s): CKTOTAL, CKMB, CKMBINDEX, TROPONINI in the last 168 hours. BNP (last 3 results) No results for input(s): PROBNP in the last 8760 hours. HbA1C: No results for input(s):  HGBA1C in the last 72 hours. CBG: No results for input(s): GLUCAP in the last 168 hours. Lipid Profile: No results for input(s): CHOL, HDL, LDLCALC, TRIG, CHOLHDL, LDLDIRECT in the last 72 hours. Thyroid Function Tests: No results for input(s): TSH, T4TOTAL, FREET4, T3FREE, THYROIDAB in the last 72 hours. Anemia Panel: No results for input(s): VITAMINB12, FOLATE, FERRITIN, TIBC, IRON, RETICCTPCT in the last 72 hours. Sepsis Labs: No results for input(s): PROCALCITON, LATICACIDVEN in the last 168 hours.  Recent Results (from the past 240 hour(s))  Resp Panel by RT-PCR (Flu A&B, Covid) Nasopharyngeal Swab     Status: None   Collection Time: 06/03/21  5:36 AM   Specimen: Nasopharyngeal Swab; Nasopharyngeal(NP) swabs in vial transport medium  Result Value Ref Range Status   SARS Coronavirus 2 by RT PCR NEGATIVE NEGATIVE Final    Comment: (NOTE) SARS-CoV-2 target nucleic acids are NOT DETECTED.  The SARS-CoV-2 RNA is generally detectable in upper respiratory specimens during the acute phase of infection. The lowest concentration of SARS-CoV-2 viral copies this assay can detect is 138 copies/mL. A negative result does not preclude SARS-Cov-2 infection and should not be used as the sole basis for treatment or other patient management decisions. A negative result may occur with  improper specimen collection/handling, submission of specimen other than nasopharyngeal swab, presence of viral  mutation(s) within the areas targeted by this assay, and inadequate number of viral copies(<138 copies/mL). A negative result must be combined with clinical observations, patient history, and epidemiological information. The expected result is Negative.  Fact Sheet for Patients:  EntrepreneurPulse.com.au  Fact Sheet for Healthcare Providers:  IncredibleEmployment.be  This test is no t yet approved or cleared by the [place] FDA and  has been authorized for detection and/or diagnosis of SARS-CoV-2 by FDA under an Emergency Use Authorization (EUA). This EUA will remain  in effect (meaning this test can be used) for the duration of the COVID-19 declaration under Section 564(b)(1) of the Act, 21 U.S.C.section 360bbb-3(b)(1), unless the authorization is terminated  or revoked sooner.       Influenza A by PCR NEGATIVE NEGATIVE Final   Influenza B by PCR NEGATIVE NEGATIVE Final    Comment: (NOTE) The Xpert Xpress SARS-CoV-2/FLU/RSV plus assay is intended as an aid in the diagnosis of influenza from Nasopharyngeal swab specimens and should not be used as a sole basis for treatment. Nasal washings and aspirates are unacceptable for Xpert Xpress SARS-CoV-2/FLU/RSV testing.  Fact Sheet for Patients: EntrepreneurPulse.com.au  Fact Sheet for Healthcare Providers: IncredibleEmployment.be  This test is not yet approved or cleared by the [place] FDA and has been authorized for detection and/or diagnosis of SARS-CoV-2 by FDA under an Emergency Use Authorization (EUA). This EUA will remain in effect (meaning this test can be used) for the duration of the COVID-19 declaration under Section 564(b)(1) of the Act, 21 U.S.C. section 360bbb-3(b)(1), unless the authorization is terminated or revoked.  Performed at [facility], [address], [place], [place] [address]       Radiology Studies: No  results found.  Scheduled Meds:  calcium carbonate  1,250 mg Oral TID WC   dexamethasone  4 mg Oral Daily   enoxaparin (LOVENOX) injection  40 mg Subcutaneous Q24H   feeding supplement  237 mL Oral QID   mirtazapine  15 mg Oral QHS   multivitamin with minerals  1 tablet Oral Daily   nicotine  21 mg Transdermal Daily   sodium zirconium cyclosilicate  10 g Oral Daily  Continuous Infusions:  sodium chloride Stopped (06/04/21 1216)     LOS: 6 days    Enzo Bi, MD [facility]  If 7PM-7AM, please contact night-coverage [email or web address]  06/09/2021, 6:35 PM

## 2021-06-09 NOTE — TOC Progression Note (Signed)
Transition of Care [facility]) - Progression Note    Patient Details  Name: Ryan [name] MRN: [id number] Date of Birth: 1961/08/06  Transition of Care [facility]) CM/SW Contact  Su Hilt, RN Phone Number: 06/09/2021, 4:18 PM  Clinical Narrative:     Left a voice mail for Kittie at [facility] and requested that they look at the patient for a bed review and call back, also left a Voice mail for Tiro, awaiting a call abck      Expected Discharge Plan and Services                                                 Social Determinants of Health (SDOH) Interventions    Readmission Risk Interventions Readmission Risk Prevention Plan 04/23/2021  Transportation Screening Complete  PCP or Specialist Appt within 3-5 Days Complete  HRI or Home Care Consult Complete  Social Work Consult for [place] Planning/Counseling Not Complete  SW consult not completed comments RNCM assigned to patient  Palliative Care Screening Complete  Medication Review Press photographer) Complete  Some recent data might be hidden

## 2021-06-10 NOTE — Progress Notes (Signed)
Nutrition Follow-up  DOCUMENTATION CODES:  Severe malnutrition in context of chronic illness, Underweight  INTERVENTION:  Continue current diet as ordered Continue Ensure Enlive po QID, each supplement provides 350 kcal and 20 grams of protein Continue MVI with minerals daily Continue snacks TID  NUTRITION DIAGNOSIS:  Severe Malnutrition related to chronic illness, cancer and cancer related treatments as evidenced by severe fat depletion, severe muscle depletion.  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 Recently admitted 6/21-8/24. Discharged to homeless shelter then returned to ED a few hours later after a fall and pain to his wrist.  Imaging in the emergency department showed a distal radius fracture and a left distal tibia and fibula fracture. Pt declined surgical interventions at this time.  Intake appears good from the limited meals recorded. Pt routinely accepting ensures. CM working on discharge planning, medically stable to dc. Requested new weight as none has been taken this admission.    Average Meal Intake: 8/27-8/29: 72% intake x 2 recorded meals (50-94%)  Nutritionally Relevant Medications: Scheduled Meds:  calcium carbonate  1,250 mg Oral TID WC   dexamethasone  4 mg Oral Daily   feeding supplement  237 mL Oral QID   mirtazapine  15 mg Oral QHS   multivitamin with minerals  1 tablet Oral Daily   sodium zirconium cyclosilicate  10 g Oral Daily   PRN Meds: ondansetron  Labs Reviewed: Na 132 BUN 28, Creatinine .43  NUTRITION - FOCUSED PHYSICAL EXAM: Flowsheet Row Most Recent Value  Orbital Region Severe depletion  Upper Arm Region Severe depletion  Thoracic and Lumbar  Region Severe depletion  Buccal Region Severe depletion  Temple Region Severe depletion  Clavicle Bone Region Severe depletion  Clavicle and Acromion Bone Region Severe depletion  Scapular Bone Region Severe depletion  Dorsal Hand Severe depletion  Patellar Region Severe depletion  Anterior Thigh Region Severe depletion  Posterior Calf Region Severe depletion  Edema (RD Assessment) None  Hair Reviewed  Eyes Reviewed  Mouth Reviewed  Skin Reviewed  Nails Reviewed   Diet Order:   Diet Order             Diet regular Room service appropriate? Yes; Fluid consistency: Thin  Diet effective now                   EDUCATION NEEDS:  Education needs have been addressed  Skin:  Skin Assessment: Reviewed RN Assessment  Last BM:  8/31 - type 2  Height:  Ht Readings from Last 1 Encounters:  06/02/21 '6\' 5"'$  (1.956 m)    Weight:  Wt Readings from Last 1 Encounters:  06/02/21 52 kg    Ideal Body Weight:  94.5 kg  BMI:  Body mass index is 13.59 kg/m.  Estimated Nutritional Needs:  Kcal:  2400-2600 Protein:  130-150 grams Fluid:  >2.4 L   Ranell Patrick, RD, LDN Clinical Dietitian Pager on [place]

## 2021-06-10 NOTE — TOC Progression Note (Addendum)
Transition of Care [facility]) - Progression Note    Patient Details  Name: Khyri Barco MRN: [id number] Date of Birth: 09-Oct-1961  Transition of Care [facility]) CM/SW [place], RN Phone Number: 06/10/2021, 10:28 AM  Clinical Narrative:     Reached out to the [facility] in [place] and left a voice mail requesting a call back from [place] to follow up on the Disability and Medicaid application that was originally applied for June 10th, I requested a call back with an update on the application   Reached out to [employer] and left a voice mail to inquire on the status of the Disability and Medicaid application, requested a call back    Expected Discharge Plan and Services                                                 Social Determinants of Health (SDOH) Interventions    Readmission Risk Interventions Readmission Risk Prevention Plan 04/23/2021  Transportation Screening Complete  PCP or Specialist Appt within 3-5 Days Complete  HRI or Home Care Consult Complete  Social Work Consult for [place] Planning/Counseling Not Complete  SW consult not completed comments RNCM assigned to patient  Palliative Care Screening Complete  Medication Review Press photographer) Complete  Some recent data might be hidden

## 2021-06-10 NOTE — Plan of Care (Signed)

## 2021-06-10 NOTE — TOC Progression Note (Signed)
Transition of Care [facility]) - Progression Note    Patient Details  Name: Ryan [name] MRN: [id number] Date of Birth: [birth year]  Transition of Care [facility]) CM/SW [place], RN Phone Number: 06/10/2021, 12:20 PM  Clinical Narrative:     I got a call back from the [facility] in [place], [place], she just applied for his disability on Aug 25th and it qilaified for [employer] and it should get "looked at" quickly due to the terminal illness Medicaid is still Pending      Expected Discharge Plan and Services                                                 Social Determinants of Health (SDOH) Interventions    Readmission Risk Interventions Readmission Risk Prevention Plan 04/23/2021  Transportation Screening Complete  PCP or Specialist Appt within 3-5 Days Complete  HRI or [place] Complete  Social Work Consult for [place] Planning/Counseling Not Complete  SW consult not completed comments RNCM assigned to patient  Palliative Care Screening Complete  Medication Review Press photographer) Complete  Some recent data might be hidden

## 2021-06-10 NOTE — Progress Notes (Signed)
PROGRESS NOTE    Ryan [name]  [id number] DOB: 01/04/1961 DOA: 06/02/2021 PCP: Pcp, No   Brief Narrative: Taken from H&P. Ryan [name] is a 60 y.o. 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   Patient was recently hospitalized from 6/21-8/24 due to metastasized prostate cancer. Pt was seen by [name] of oncology. He got his last prostate cancer treatment yesterday. He was just discharged from the hospital yesterday. He was supposed to go to a homeless shelter but apparently there were issues when he was transported there. He sates that he he felt dizzy and fell accidentally. No LOC. He injured his left wrist and left ankle, causing severe pain in left ankle and left wrist.    Left wrist x-ray with fracture of distal radius and ulna.  CT left ankle with impacted distal tibial and fibular fractures, mild posterior displacement and angulation.  Orthopedic was consulted and they are recommending conservative management based on his other comorbidities and advanced malignancy.  Patient will be nonweightbearing her left lower extremity.  CT head without any new abnormality.  Prior osseous mets noted.  PT/OT is recommending SNF.  Still pending Medicaid application.  TOC is working on placement.  Subjective: Pt reported he slept well with the increased trazodone.  Eating well.   Assessment & Plan:   Principal Problem:   Closed left ankle fracture Active Problems:   Fall   Anemia of chronic disease   Protein-calorie malnutrition, severe (HCC)   Cancer cachexia (HCC)   Prostate cancer metastatic to bone (HCC)   Left wrist fracture, closed, initial encounter   Hypocalcemia   Hyperkalemia   Closed fracture of left distal radius  Closed left ankle fracture and left wrist fracture, closed, initial encounter: consulted [name] of ortho, given patient's  metastasized prostate cancer, recommended conservative treatment.  Patient will be nonweightbearing on left lower extremity and follow-up with orthopedic as an outpatient. -Continue with pain management -PT/OT is recommending SNF. -TOC is trying-difficult placement -Asked nursing staff to encourage sitting in chair.  Anemia of chronic disease.  Hemoglobin stable around 8.2, no obvious bleeding.  Patient also received his chemotherapy a day before. -Continue to monitor -Transfuse if below 7.  Hyperkalemia.  Resolved. -Continue to monitor  Severe protein caloric malnutrition/cancer cachexia. -Continue with Ensure  Prostate cancer metastatic to bone [facility]): Consulted [name].  Per [name], Pt was seen in hospital in previous [place]. He got his last prostate cancer treatment yesterday, is not due for another 4 weeks; already has out patient follow up scheduled.    Hypocalcemia.   Corrected calcium of 6.9.  Received total of 3 g of calcium gluconate IV. Vitamin D levels more than 200. -Continue with calcium supplement -Continue to monitor  Anxiety and depression. -Continue with hydroxyzine and Remeron  Severe protein caloric malnutrition. Estimated body mass index is 13.59 kg/m as calculated from the following:   Height as of this encounter: '6\' 5"'$  (1.956 m).   Weight as of this encounter: 52 kg.  -Dietitian consult   Objective: Vitals:   06/09/21 1715 06/09/21 1933 06/10/21 0315 06/10/21 0828  BP: 121/62 109/61 (!) 109/58 110/60  Pulse: 98 (!) 103 (!) 104 (!) 103  Resp: '16 20 20 16  '$ Temp: 98.6 F (37 C) 98.5 F (36.9 C) 98.3 F (36.8 C) 98.9 F (37.2 C)  TempSrc:  Oral    SpO2: 100%  98% 98% 99%  Weight:      Height:        Intake/Output Summary (Last 24 hours) at 06/10/2021 1621 Last data filed at 06/10/2021 1026 Gross per 24 hour  Intake 480 ml  Output 1400 ml  Net -920 ml   Filed Weights   06/02/21 1634  Weight: 52 kg     Examination:  Constitutional: NAD, AAOx3 HEENT: conjunctivae and lids normal, EOMI CV: No cyanosis.   RESP: normal respiratory effort, on RA Extremities: left arm and left leg in splints SKIN: warm, dry Neuro: II - XII grossly intact.   Psych: Normal mood and affect.     DVT prophylaxis: Lovenox Code Status: DNR Family Communication: Disposition Plan:  Status is: Inpatient  Remains inpatient appropriate because:Inpatient level of care appropriate due to severity of illness  Dispo: The patient is from: Home              Anticipated d/c is to: SNF              Patient currently is medically stable to d/c.   Difficult to place patient Yes              Level of care: Med-Surg  All the records are reviewed and case discussed with Care Management/Social Worker. Management plans discussed with the patient, nursing and they are in agreement.  Consultants:  Orthopedic surgery.  Procedures:  Antimicrobials:   Data Reviewed: I have personally reviewed following labs and imaging studies  CBC: Recent Labs  Lab 06/04/21 0528 06/05/21 0522  WBC 7.4 7.5  HGB 8.0* 8.2*  HCT 25.7* 26.5*  MCV 90.8 89.5  PLT 155 [id number]*   Basic Metabolic Panel: Recent Labs  Lab 06/04/21 0528 06/05/21 0522 06/05/21 1349 06/06/21 0944 06/07/21 0438 06/09/21 0430  NA 135 134*  --   --  131* 132*  K 4.9 5.3* 4.9 5.0 4.8 5.0  CL 106 107  --   --  104 104  CO2 21* 20*  --   --  19* 20*  GLUCOSE 103* 98  --   --  112* 105*  BUN 29* 27*  --   --  20 28*  CREATININE 0.51* 0.54*  --   --  0.55* 0.52*  CALCIUM 5.6* 6.5*  --   --  6.6* 6.7*  PHOS  --  3.3  --   --  2.9 3.8   GFR: Estimated Creatinine Clearance: 73.1 mL/min (A) (by C-G formula based on SCr of 0.52 mg/dL (L)). Liver Function Tests: Recent Labs  Lab 06/04/21 0528 06/05/21 0522 06/07/21 0438 06/09/21 0430  AST 16  --   --   --   ALT 17  --   --   --   ALKPHOS 1,475*  --   --   --   BILITOT 0.8  --   --   --   PROT 5.7*   --   --   --   ALBUMIN 3.1* 3.2* 3.2* 3.2*   No results for input(s): LIPASE, AMYLASE in the last 168 hours. No results for input(s): AMMONIA in the last 168 hours. Coagulation Profile: No results for input(s): INR, PROTIME in the last 168 hours.  Cardiac Enzymes: No results for input(s): CKTOTAL, CKMB, CKMBINDEX, TROPONINI in the last 168 hours. BNP (last 3 results) No results for input(s): PROBNP in the last 8760 hours. HbA1C: No results for input(s): HGBA1C in the last 72 hours. CBG: No results for input(s): GLUCAP in  the last 168 hours. Lipid Profile: No results for input(s): CHOL, HDL, LDLCALC, TRIG, CHOLHDL, LDLDIRECT in the last 72 hours. Thyroid Function Tests: No results for input(s): TSH, T4TOTAL, FREET4, T3FREE, THYROIDAB in the last 72 hours. Anemia Panel: No results for input(s): VITAMINB12, FOLATE, FERRITIN, TIBC, IRON, RETICCTPCT in the last 72 hours. Sepsis Labs: No results for input(s): PROCALCITON, LATICACIDVEN in the last 168 hours.  Recent Results (from the past 240 hour(s))  Resp Panel by RT-PCR (Flu A&B, Covid) Nasopharyngeal Swab     Status: None   Collection Time: 06/03/21  5:36 AM   Specimen: Nasopharyngeal Swab; Nasopharyngeal(NP) swabs in vial transport medium  Result Value Ref Range Status   SARS Coronavirus 2 by RT PCR NEGATIVE NEGATIVE Final    Comment: (NOTE) SARS-CoV-2 target nucleic acids are NOT DETECTED.  The SARS-CoV-2 RNA is generally detectable in upper respiratory specimens during the acute phase of infection. The lowest concentration of SARS-CoV-2 viral copies this assay can detect is 138 copies/mL. A negative result does not preclude SARS-Cov-2 infection and should not be used as the sole basis for treatment or other patient management decisions. A negative result may occur with  improper specimen collection/handling, submission of specimen other than nasopharyngeal swab, presence of viral mutation(s) within the areas targeted by this  assay, and inadequate number of viral copies(<138 copies/mL). A negative result must be combined with clinical observations, patient history, and epidemiological information. The expected result is Negative.  Fact Sheet for Patients:  EntrepreneurPulse.com.au  Fact Sheet for Healthcare Providers:  IncredibleEmployment.be  This test is no t yet approved or cleared by the [place] FDA and  has been authorized for detection and/or diagnosis of SARS-CoV-2 by FDA under an Emergency Use Authorization (EUA). This EUA will remain  in effect (meaning this test can be used) for the duration of the COVID-19 declaration under Section 564(b)(1) of the Act, 21 U.S.C.section 360bbb-3(b)(1), unless the authorization is terminated  or revoked sooner.       Influenza A by PCR NEGATIVE NEGATIVE Final   Influenza B by PCR NEGATIVE NEGATIVE Final    Comment: (NOTE) The Xpert Xpress SARS-CoV-2/FLU/RSV plus assay is intended as an aid in the diagnosis of influenza from Nasopharyngeal swab specimens and should not be used as a sole basis for treatment. Nasal washings and aspirates are unacceptable for Xpert Xpress SARS-CoV-2/FLU/RSV testing.  Fact Sheet for Patients: EntrepreneurPulse.com.au  Fact Sheet for Healthcare Providers: IncredibleEmployment.be  This test is not yet approved or cleared by the [place] FDA and has been authorized for detection and/or diagnosis of SARS-CoV-2 by FDA under an Emergency Use Authorization (EUA). This EUA will remain in effect (meaning this test can be used) for the duration of the COVID-19 declaration under Section 564(b)(1) of the Act, 21 U.S.C. section 360bbb-3(b)(1), unless the authorization is terminated or revoked.  Performed at [facility], [address], [place], [place] [address]       Radiology Studies: No results found.  Scheduled Meds:  calcium  carbonate  1,250 mg Oral TID WC   dexamethasone  4 mg Oral Daily   enoxaparin (LOVENOX) injection  40 mg Subcutaneous Q24H   feeding supplement  237 mL Oral QID   mirtazapine  15 mg Oral QHS   multivitamin with minerals  1 tablet Oral Daily   nicotine  21 mg Transdermal Daily   Continuous Infusions:  sodium chloride Stopped (06/04/21 1216)     LOS: 7 days    Enzo Bi, MD  [facility]  If 7PM-7AM, please contact night-coverage [email or web address]  06/10/2021, 4:21 PM

## 2021-06-10 NOTE — TOC Progression Note (Addendum)
Transition of Care [facility]) - Progression Note    Patient Details  Name: Ryan [name] MRN: [id number] Date of Birth: 03/26/1961  Transition of Care [facility]) CM/SW [place], RN Phone Number: 06/10/2021, 9:44 AM  Clinical Narrative:     Reached out to [facility] and [facility] requested to review for bed offer   Called [facility] in [place] and requested a call back for a bed offer, awaiting a bed offer    Expected Discharge Plan and Services                                                 Social Determinants of Health (SDOH) Interventions    Readmission Risk Interventions Readmission Risk Prevention Plan 04/23/2021  Transportation Screening Complete  PCP or Specialist Appt within 3-5 Days Complete  HRI or [place] Complete  Social Work Consult for [place] Planning/Counseling Not Complete  SW consult not completed comments RNCM assigned to patient  Palliative Care Screening Complete  Medication Review [employer]) Complete  Some recent data might be hidden

## 2021-06-11 NOTE — Progress Notes (Signed)
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  Pt recently here for multiple months with difficult d/c planning, did ultimatley d/c to homeless shelter and was gone only a few hours before fall with L ankle and wrist fractures with return to the ED.    PT Comments    Pt was long sitting in bed upon arriving. He refuses OOB activity however did agree and fully participated in there ex. Lengthy discussion on importance of continuing to attempt OOB activity to promote strength while keep skin integrity intact.  He states understanding and tolerated there ex well. Pt is NWB LLE/LUE. W/C is most appropriate however pt looks to be in hospital for foreseeable future and may not DC prior to wt bearing restrictions being removed. Acute PT will continue to progress pt as able per current POC.    Follow Up Recommendations  SNF;Supervision/Assistance - 24 hour     Equipment Recommendations  Other (comment) (ongoing assessment)       Precautions / Restrictions Precautions Precautions: Fall Restrictions Weight Bearing Restrictions: Yes LUE Weight Bearing: Non weight bearing LLE Weight Bearing: Non weight bearing    Mobility  Bed Mobility Overal bed mobility: Needs Assistance   Rolling: Min assist   Supine to sit: Mod assist     General bed mobility comments: pt unwilling to exit bed but was willing to perform ther ex in bed. Did tolerate higher level ther ex in bed to promote strengthening and return in function    Transfers       Sit to Stand: Mod assist;+2 physical assistance Stand pivot transfers: Max assist;+2 physical assistance      Lateral/Scoot  Transfers: Min assist;Max assist;With slide board        Cognition Arousal/Alertness: Awake/alert Behavior During Therapy: Flat affect Overall Cognitive Status: Within Functional Limits for tasks assessed    General Comments: Encouragement  needed for participation      Exercises General Exercises - Lower Extremity Ankle Circles/Pumps: Right;10 reps Quad Sets: AROM;Both Gluteal Sets: AROM;10 reps Heel Slides: AROM;10 reps Hip ABduction/ADduction: AROM;10 reps Straight Leg Raises: AROM;10 reps Other Exercises Other Exercises: RUE ther. ex with red theraband for right elbow fleion, extension, diagonal abduction.        Pertinent Vitals/Pain Pain Assessment: 0-10 Pain Score: 6  Faces Pain Scale: Hurts little more Pain Location: L ankle, L wrist. Complains of numbness in b/l LE     PT Goals (current goals can now be found in the care plan section) Acute Rehab PT Goals Patient Stated Goal: get stronger Progress towards PT goals: Progressing toward goals    Frequency    Min 2X/week      PT Plan Current plan remains appropriate    Co-evaluation     PT goals addressed during session: Mobility/safety with mobility;Balance;Proper use of DME;Strengthening/ROM        AM-PAC PT "6 Clicks" Mobility   Outcome Measure  Help needed turning from your back to your side while in a flat bed without using bedrails?: A Little Help needed moving from lying on your back to sitting on the side of a flat bed without using bedrails?: A Little Help needed moving to and from a  bed to a chair (including a wheelchair)?: Total Help needed standing up from a chair using your arms (e.g., wheelchair or bedside chair)?: Total Help needed to walk in hospital room?: Total Help needed climbing 3-5 steps with a railing? : Total 6 Click Score: 10    End of Session Equipment Utilized During Treatment: Gait belt Activity Tolerance: Patient tolerated treatment well;Patient limited by  fatigue Patient left: in bed;with call bell/phone within reach;with bed alarm set Nurse Communication: Mobility status PT Visit Diagnosis: Unsteadiness on feet (R26.81);Muscle weakness (generalized) (M62.81);Difficulty in walking, not elsewhere classified (R26.2);History of falling (Z91.81);Pain Pain - Right/Left: Left Pain - part of body: Ankle and joints of foot     Time: 1344-1355 PT Time Calculation (min) (ACUTE ONLY): 11 min  Charges:  $Therapeutic Exercise: 8-22 mins                    Julaine Fusi PTA 06/11/21, 2:12 PM

## 2021-06-11 NOTE — Progress Notes (Signed)
PROGRESS NOTE    Ryan [name]  [id number] DOB: Nov 09, 1960 DOA: 06/02/2021 PCP: Pcp, No   Brief Narrative: Taken from H&P. Ryan [name] is a 60 y.o. 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   Patient was recently hospitalized from 6/21-8/24 due to metastasized prostate cancer. Pt was seen by [name] of oncology. He got his last prostate cancer treatment yesterday. He was just discharged from the hospital yesterday. He was supposed to go to a homeless shelter but apparently there were issues when he was transported there. He sates that he he felt dizzy and fell accidentally. No LOC. He injured his left wrist and left ankle, causing severe pain in left ankle and left wrist.    Left wrist x-ray with fracture of distal radius and ulna.  CT left ankle with impacted distal tibial and fibular fractures, mild posterior displacement and angulation.  Orthopedic was consulted and they are recommending conservative management based on his other comorbidities and advanced malignancy.  Patient will be nonweightbearing her left lower extremity.  CT head without any new abnormality.  Prior osseous mets noted.  PT/OT is recommending SNF.  Still pending Medicaid application.  TOC is working on placement.  Subjective: No new complaints.  Pt has been eating well.  Pt has been more motivated to work with PT/OT and not refusing.   Assessment & Plan:   Principal Problem:   Closed left ankle fracture Active Problems:   Fall   Anemia of chronic disease   Protein-calorie malnutrition, severe (HCC)   Cancer cachexia (HCC)   Prostate cancer metastatic to bone (HCC)   Left wrist fracture, closed, initial encounter   Hypocalcemia   Hyperkalemia   Closed fracture of left distal radius  Closed left ankle fracture and left wrist fracture, closed, initial encounter:  consulted [name] of ortho, given patient's metastasized prostate cancer, recommended conservative treatment.  Patient will be nonweightbearing on left lower extremity and follow-up with orthopedic as an outpatient. -Continue with pain management -PT/OT is recommending SNF. -TOC is trying-difficult placement -Asked nursing staff to encourage sitting in chair.  Anemia of chronic disease.  Hemoglobin stable around 8.2, no obvious bleeding.  Patient also received his chemotherapy a day before. -Continue to monitor -Transfuse if below 7.  Hyperkalemia.  Resolved. -Continue to monitor  Severe protein caloric malnutrition/cancer cachexia. -Continue with Ensure  Prostate cancer metastatic to bone [facility]): Consulted [name].  Per [name], Pt was seen in hospital in previous [place]. He got his last prostate cancer treatment yesterday, is not due for another 4 weeks; already has out patient follow up scheduled.    Hypocalcemia.   Corrected calcium of 6.9.  Received total of 3 g of calcium gluconate IV. Vitamin D levels more than 200. -Continue with calcium supplement -Continue to monitor  Anxiety and depression. -Continue with hydroxyzine and Remeron  Severe protein caloric malnutrition. Estimated body mass index is 13.59 kg/m as calculated from the following:   Height as of this encounter: '6\' 5"'$  (1.956 m).   Weight as of this encounter: 52 kg.  -Dietitian consult   Objective: Vitals:   06/10/21 1958 06/11/21 0543 06/11/21 0700 06/11/21 1552  BP: 102/63 107/65 103/62 113/60  Pulse: (!) 107 95 85 (!) 110  Resp: '18 16 15 16  '$ Temp: 98 F (36.7 C) 98.5 F (36.9 C) (!) 97.5 F (36.4 C) 98.4 F (36.9 C)  TempSrc: Oral Oral Oral   SpO2: 97% 99% 100% 98%  Weight:      Height:        Intake/Output Summary (Last 24 hours) at 06/11/2021 1801 Last data filed at 06/11/2021 1130 Gross per 24 hour  Intake 590 ml  Output 300 ml  Net 290 ml   Filed Weights   06/02/21 1634   Weight: 52 kg    Examination:  Constitutional: NAD, AAOx3 HEENT: conjunctivae and lids normal, EOMI CV: No cyanosis.   RESP: normal respiratory effort, on RA Extremities: left arm and left leg in splints SKIN: warm, dry Neuro: II - XII grossly intact.     DVT prophylaxis: Lovenox Code Status: DNR Family Communication: Disposition Plan:  Status is: Inpatient  Remains inpatient appropriate because:Inpatient level of care appropriate due to severity of illness  Dispo: The patient is from: Home              Anticipated d/c is to: SNF              Patient currently is medically stable to d/c.   Difficult to place patient Yes              Level of care: Med-Surg  All the records are reviewed and case discussed with Care Management/Social Worker. Management plans discussed with the patient, nursing and they are in agreement.  Consultants:  Orthopedic surgery.  Procedures:  Antimicrobials:   Data Reviewed: I have personally reviewed following labs and imaging studies  CBC: Recent Labs  Lab 06/05/21 0522  WBC 7.5  HGB 8.2*  HCT 26.5*  MCV 89.5  PLT 123456*   Basic Metabolic Panel: Recent Labs  Lab 06/05/21 0522 06/05/21 1349 06/06/21 0944 06/07/21 0438 06/09/21 0430  NA 134*  --   --  131* 132*  K 5.3* 4.9 5.0 4.8 5.0  CL 107  --   --  104 104  CO2 20*  --   --  19* 20*  GLUCOSE 98  --   --  112* 105*  BUN 27*  --   --  20 28*  CREATININE 0.54*  --   --  0.55* 0.52*  CALCIUM 6.5*  --   --  6.6* 6.7*  PHOS 3.3  --   --  2.9 3.8   GFR: Estimated Creatinine Clearance: 73.1 mL/min (A) (by C-G formula based on SCr of 0.52 mg/dL (L)). Liver Function Tests: Recent Labs  Lab 06/05/21 0522 06/07/21 0438 06/09/21 0430  ALBUMIN 3.2* 3.2* 3.2*   No results for input(s): LIPASE, AMYLASE in the last 168 hours. No results for input(s): AMMONIA in the last 168 hours. Coagulation Profile: No results for input(s): INR, PROTIME in the last 168 hours.  Cardiac  Enzymes: No results for input(s): CKTOTAL, CKMB, CKMBINDEX, TROPONINI in the last 168 hours. BNP (last 3 results) No results for input(s): PROBNP in the last 8760 hours. HbA1C: No results for input(s): HGBA1C in the last 72 hours. CBG: No results for input(s): GLUCAP in the last 168 hours. Lipid Profile: No results for input(s): CHOL, HDL, LDLCALC, TRIG, CHOLHDL, LDLDIRECT in the last 72 hours. Thyroid Function Tests: No results for input(s): TSH, T4TOTAL, FREET4, T3FREE, THYROIDAB in the last 72 hours. Anemia Panel: No results for input(s): VITAMINB12, FOLATE, FERRITIN, TIBC, IRON, RETICCTPCT in the last 72 hours. Sepsis Labs: No results for input(s): PROCALCITON, LATICACIDVEN in the last 168 hours.  Recent Results (from the past 240 hour(s))  Resp Panel by RT-PCR (Flu  A&B, Covid) Nasopharyngeal Swab     Status: None   Collection Time: 06/03/21  5:36 AM   Specimen: Nasopharyngeal Swab; Nasopharyngeal(NP) swabs in vial transport medium  Result Value Ref Range Status   SARS Coronavirus 2 by RT PCR NEGATIVE NEGATIVE Final    Comment: (NOTE) SARS-CoV-2 target nucleic acids are NOT DETECTED.  The SARS-CoV-2 RNA is generally detectable in upper respiratory specimens during the acute phase of infection. The lowest concentration of SARS-CoV-2 viral copies this assay can detect is 138 copies/mL. A negative result does not preclude SARS-Cov-2 infection and should not be used as the sole basis for treatment or other patient management decisions. A negative result may occur with  improper specimen collection/handling, submission of specimen other than nasopharyngeal swab, presence of viral mutation(s) within the areas targeted by this assay, and inadequate number of viral copies(<138 copies/mL). A negative result must be combined with clinical observations, patient history, and epidemiological information. The expected result is Negative.  Fact Sheet for Patients:   EntrepreneurPulse.com.au  Fact Sheet for Healthcare Providers:  IncredibleEmployment.be  This test is no t yet approved or cleared by the [place] FDA and  has been authorized for detection and/or diagnosis of SARS-CoV-2 by FDA under an Emergency Use Authorization (EUA). This EUA will remain  in effect (meaning this test can be used) for the duration of the COVID-19 declaration under Section 564(b)(1) of the Act, 21 U.S.C.section 360bbb-3(b)(1), unless the authorization is terminated  or revoked sooner.       Influenza A by PCR NEGATIVE NEGATIVE Final   Influenza B by PCR NEGATIVE NEGATIVE Final    Comment: (NOTE) The Xpert Xpress SARS-CoV-2/FLU/RSV plus assay is intended as an aid in the diagnosis of influenza from Nasopharyngeal swab specimens and should not be used as a sole basis for treatment. Nasal washings and aspirates are unacceptable for Xpert Xpress SARS-CoV-2/FLU/RSV testing.  Fact Sheet for Patients: EntrepreneurPulse.com.au  Fact Sheet for Healthcare Providers: IncredibleEmployment.be  This test is not yet approved or cleared by the [place] FDA and has been authorized for detection and/or diagnosis of SARS-CoV-2 by FDA under an Emergency Use Authorization (EUA). This EUA will remain in effect (meaning this test can be used) for the duration of the COVID-19 declaration under Section 564(b)(1) of the Act, 21 U.S.C. section 360bbb-3(b)(1), unless the authorization is terminated or revoked.  Performed at [facility], [address] [place], [place] [address]       Radiology Studies: No results found.  Scheduled Meds:  calcium carbonate  1,250 mg Oral TID WC   dexamethasone  4 mg Oral Daily   enoxaparin (LOVENOX) injection  40 mg Subcutaneous Q24H   feeding supplement  237 mL Oral QID   mirtazapine  15 mg Oral QHS   multivitamin with minerals  1 tablet Oral  Daily   nicotine  21 mg Transdermal Daily   Continuous Infusions:  sodium chloride Stopped (06/04/21 1216)     LOS: 8 days    Enzo Bi, MD [facility]  If 7PM-7AM, please contact night-coverage [email or web address]  06/11/2021, 6:01 PM

## 2021-06-11 NOTE — Progress Notes (Signed)
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  Pt recently here for multiple months with difficult d/c planning, did ultimatley d/c to homeless shelter and was gone only a few hours before fall with L ankle and wrist fractures with return to the ED.   OT comments  Pt. continues to be limited by weakness, and left sided ankle, and wrist fractures.  Pt. participated in UE strengthening using red theraband for diagonal shoulder flexion, abduction, as well as elbow flexion, and extension. Verbal cues were required for pace, and technique. RUE exercise was performed in preparation for ADLs, and transfers  Pt. Continues to benefit from OT services for ADL training, A/E training, there. Ex. and pt. Education about positioning, and work simplification strategies.     Follow Up Recommendations  SNF    Equipment Recommendations  None recommended by OT    Recommendations for Other Services      Precautions / Restrictions Precautions Precautions: Fall Restrictions Weight Bearing Restrictions: Yes LUE Weight Bearing: Non weight bearing LLE Weight Bearing: Non weight bearing       Mobility Bed Mobility Overal bed mobility: Needs Assistance   Rolling: Min assist   Supine to sit: Mod assist          Transfers       Sit to Stand: Mod assist;+2 physical assistance Stand pivot transfers: Max assist;+2 physical assistance      Lateral/Scoot Transfers: Min assist;Max assist;With slide board      Balance                                           ADL either performed or assessed with clinical judgement   ADL                                 ADL Comments: Pt. Requires MaxA UE, and LE ADLs.               Vision Baseline Vision/History: 0 No visual deficits Patient Visual Report: No change from baseline     Perception     Praxis      Cognition Arousal/Alertness: Awake/alert Behavior During Therapy: Flat affect Overall Cognitive Status: Within Functional Limits for tasks assessed                                 General Comments: Encouragement  needed for participation        Exercises Other Exercises Other Exercises: RUE ther. ex with red theraband for right elbow fleion, extension, diagonal abduction.   Shoulder Instructions       General Comments      Pertinent Vitals/ Pain       Pain Assessment: 0-10  Home Living                                          Prior Functioning/Environment  Frequency  Min 1X/week        Progress Toward Goals  OT Goals(current goals can now be found in the care plan section)  Progress towards OT goals: Progressing toward goals  Acute Rehab OT Goals Patient Stated Goal: To get better OT Goal Formulation: With patient Time For Goal Achievement: 06/18/21  Plan Frequency remains appropriate;Discharge plan remains appropriate    Co-evaluation                 AM-PAC OT "6 Clicks" Daily Activity     Outcome Measure   Help from another person eating meals?: A Little Help from another person taking care of personal grooming?: A Little Help from another person toileting, which includes using toliet, bedpan, or urinal?: A Lot Help from another person bathing (including washing, rinsing, drying)?: A Lot Help from another person to put on and taking off regular upper body clothing?: A Lot Help from another person to put on and taking off regular lower body clothing?: A Lot 6 Click Score: 14    End of Session Equipment Utilized During Treatment: Rolling walker  OT Visit Diagnosis:  Unsteadiness on feet (R26.81);Muscle weakness (generalized) (M62.81);Adult, failure to thrive (R62.7) Pain - Right/Left: Left   Activity Tolerance Patient limited by lethargy   Patient Left in chair;with call bell/phone within reach;with chair alarm set;with nursing/sitter in room   Nurse Communication Mobility status        Time: [id number] OT Time Calculation (min): 17 min  Charges: OT General Charges $OT Visit: 1 Visit OT Treatments $Self Care/Home Management : 8-22 mins Harrel Carina, MS, OTR/L    Harrel Carina 06/11/2021, 1:48 PM

## 2021-06-12 NOTE — Progress Notes (Signed)
PROGRESS NOTE    Ryan [name]  [id number] DOB: [birth year] DOA: 06/02/2021 PCP: Pcp, No   Brief Narrative: Taken from H&P. Ryan [name] is a 60 y.o. 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   Patient was recently hospitalized from 6/21-8/24 due to metastasized prostate cancer. Pt was seen by [name] of oncology. He got his last prostate cancer treatment yesterday. He was just discharged from the hospital yesterday. He was supposed to go to a homeless shelter but apparently there were issues when he was transported there. He sates that he he felt dizzy and fell accidentally. No LOC. He injured his left wrist and left ankle, causing severe pain in left ankle and left wrist.    Left wrist x-ray with fracture of distal radius and ulna.  CT left ankle with impacted distal tibial and fibular fractures, mild posterior displacement and angulation.  Orthopedic was consulted and they are recommending conservative management based on his other comorbidities and advanced malignancy.  Patient will be nonweightbearing her left lower extremity.  CT head without any new abnormality.  Prior osseous mets noted.  PT/OT is recommending SNF.  Still pending Medicaid application.  TOC is working on placement.  Subjective: No new complaint today.   Assessment & Plan:   Principal Problem:   Closed left ankle fracture Active Problems:   Fall   Anemia of chronic disease   Protein-calorie malnutrition, severe (HCC)   Cancer cachexia (HCC)   Prostate cancer metastatic to bone (HCC)   Left wrist fracture, closed, initial encounter   Hypocalcemia   Hyperkalemia   Closed fracture of left distal radius  Closed left ankle fracture and left wrist fracture, closed, initial encounter: consulted [name] of ortho, given patient's metastasized prostate cancer, recommended  conservative treatment.  Patient will be nonweightbearing on left lower extremity and follow-up with orthopedic as an outpatient. -Continue with pain management -PT/OT is recommending SNF. -TOC is trying-difficult placement -Asked nursing staff to encourage sitting in chair.  Anemia of chronic disease.  Hemoglobin stable around 8.2, no obvious bleeding.  Patient also received his chemotherapy a day before. -Continue to monitor -Transfuse if below 7.  Hyperkalemia.  Resolved. -Continue to monitor  Severe protein caloric malnutrition/cancer cachexia. -Continue with Ensure  Prostate cancer metastatic to bone [facility]): Consulted [name].  Per [name], Pt was seen in hospital in previous [place]. He got his last prostate cancer treatment yesterday, is not due for another 4 weeks; already has out patient follow up scheduled.    Hypocalcemia.   Corrected calcium of 6.9.  Received total of 3 g of calcium gluconate IV. Vitamin D levels more than 200. -Continue with calcium supplement -Continue to monitor  Anxiety and depression. -Continue with hydroxyzine and Remeron  Severe protein caloric malnutrition. Estimated body mass index is 13.59 kg/m as calculated from the following:   Height as of this encounter: '6\' 5"'$  (1.956 m).   Weight as of this encounter: 52 kg.  -Dietitian consult   Objective: Vitals:   06/11/21 2100 06/12/21 0508 06/12/21 0805 06/12/21 1615  BP: (!) 119/59 110/60 102/62 116/89  Pulse: (!) 108 (!) 103 (!) 105 (!) 103  Resp: '20 18 18 18  '$ Temp: 99.2 F (37.3 C) 98.8 F (37.1 C) 98.9 F (37.2 C) 98.5 F (36.9 C)  TempSrc:  Oral Oral Oral  SpO2: 98% 98% 97% 98%  Weight:  Height:        Intake/Output Summary (Last 24 hours) at 06/12/2021 1734 Last data filed at 06/12/2021 1030 Gross per 24 hour  Intake 240 ml  Output 700 ml  Net -460 ml   Filed Weights   06/02/21 1634  Weight: 52 kg    Examination:  Constitutional: NAD, sleeping CV: No  cyanosis.   RESP: normal respiratory effort, on RA Extremities: No effusions, edema in BLE.  Left arm and left leg in splints   DVT prophylaxis: Lovenox Code Status: DNR Family Communication: Disposition Plan:  Status is: Inpatient  Dispo: The patient is from: Home              Anticipated d/c is to: SNF              Patient currently is medically stable to d/c.   Difficult to place patient Yes              Level of care: Med-Surg  All the records are reviewed and case discussed with Care Management/Social Worker. Management plans discussed with the patient, nursing and they are in agreement.  Consultants:  Orthopedic surgery.  Procedures:  Antimicrobials:   Data Reviewed: I have personally reviewed following labs and imaging studies  CBC: No results for input(s): WBC, NEUTROABS, HGB, HCT, MCV, PLT in the last 168 hours.  Basic Metabolic Panel: Recent Labs  Lab 06/06/21 0944 06/07/21 0438 06/09/21 0430  NA  --  131* 132*  K 5.0 4.8 5.0  CL  --  104 104  CO2  --  19* 20*  GLUCOSE  --  112* 105*  BUN  --  20 28*  CREATININE  --  0.55* 0.52*  CALCIUM  --  6.6* 6.7*  PHOS  --  2.9 3.8   GFR: Estimated Creatinine Clearance: 73.1 mL/min (A) (by C-G formula based on SCr of 0.52 mg/dL (L)). Liver Function Tests: Recent Labs  Lab 06/07/21 0438 06/09/21 0430  ALBUMIN 3.2* 3.2*   No results for input(s): LIPASE, AMYLASE in the last 168 hours. No results for input(s): AMMONIA in the last 168 hours. Coagulation Profile: No results for input(s): INR, PROTIME in the last 168 hours.  Cardiac Enzymes: No results for input(s): CKTOTAL, CKMB, CKMBINDEX, TROPONINI in the last 168 hours. BNP (last 3 results) No results for input(s): PROBNP in the last 8760 hours. HbA1C: No results for input(s): HGBA1C in the last 72 hours. CBG: No results for input(s): GLUCAP in the last 168 hours. Lipid Profile: No results for input(s): CHOL, HDL, LDLCALC, TRIG, CHOLHDL, LDLDIRECT in  the last 72 hours. Thyroid Function Tests: No results for input(s): TSH, T4TOTAL, FREET4, T3FREE, THYROIDAB in the last 72 hours. Anemia Panel: No results for input(s): VITAMINB12, FOLATE, FERRITIN, TIBC, IRON, RETICCTPCT in the last 72 hours. Sepsis Labs: No results for input(s): PROCALCITON, LATICACIDVEN in the last 168 hours.  Recent Results (from the past 240 hour(s))  Resp Panel by RT-PCR (Flu A&B, Covid) Nasopharyngeal Swab     Status: None   Collection Time: 06/03/21  5:36 AM   Specimen: Nasopharyngeal Swab; Nasopharyngeal(NP) swabs in vial transport medium  Result Value Ref Range Status   SARS Coronavirus 2 by RT PCR NEGATIVE NEGATIVE Final    Comment: (NOTE) SARS-CoV-2 target nucleic acids are NOT DETECTED.  The SARS-CoV-2 RNA is generally detectable in upper respiratory specimens during the acute phase of infection. The lowest concentration of SARS-CoV-2 viral copies this assay can detect is 138 copies/mL. A negative  result does not preclude SARS-Cov-2 infection and should not be used as the sole basis for treatment or other patient management decisions. A negative result may occur with  improper specimen collection/handling, submission of specimen other than nasopharyngeal swab, presence of viral mutation(s) within the areas targeted by this assay, and inadequate number of viral copies(<138 copies/mL). A negative result must be combined with clinical observations, patient history, and epidemiological information. The expected result is Negative.  Fact Sheet for Patients:  EntrepreneurPulse.com.au  Fact Sheet for Healthcare Providers:  IncredibleEmployment.be  This test is no t yet approved or cleared by the [place] FDA and  has been authorized for detection and/or diagnosis of SARS-CoV-2 by FDA under an Emergency Use Authorization (EUA). This EUA will remain  in effect (meaning this test can be used) for the duration of  the COVID-19 declaration under Section 564(b)(1) of the Act, 21 U.S.C.section 360bbb-3(b)(1), unless the authorization is terminated  or revoked sooner.       Influenza A by PCR NEGATIVE NEGATIVE Final   Influenza B by PCR NEGATIVE NEGATIVE Final    Comment: (NOTE) The Xpert Xpress SARS-CoV-2/FLU/RSV plus assay is intended as an aid in the diagnosis of influenza from Nasopharyngeal swab specimens and should not be used as a sole basis for treatment. Nasal washings and aspirates are unacceptable for Xpert Xpress SARS-CoV-2/FLU/RSV testing.  Fact Sheet for Patients: EntrepreneurPulse.com.au  Fact Sheet for Healthcare Providers: IncredibleEmployment.be  This test is not yet approved or cleared by the [place] FDA and has been authorized for detection and/or diagnosis of SARS-CoV-2 by FDA under an Emergency Use Authorization (EUA). This EUA will remain in effect (meaning this test can be used) for the duration of the COVID-19 declaration under Section 564(b)(1) of the Act, 21 U.S.C. section 360bbb-3(b)(1), unless the authorization is terminated or revoked.  Performed at [facility], [address], [place], [place] [address]       Radiology Studies: No results found.  Scheduled Meds:  calcium carbonate  1,250 mg Oral TID WC   dexamethasone  4 mg Oral Daily   enoxaparin (LOVENOX) injection  40 mg Subcutaneous Q24H   feeding supplement  237 mL Oral QID   mirtazapine  15 mg Oral QHS   multivitamin with minerals  1 tablet Oral Daily   nicotine  21 mg Transdermal Daily   Continuous Infusions:  sodium chloride Stopped (06/04/21 1216)     LOS: 9 days    Enzo Bi, MD [facility]  If 7PM-7AM, please contact night-coverage [email or web address]  06/12/2021, 5:34 PM

## 2021-06-13 LAB — GLUCOSE, CAPILLARY
Glucose-Capillary: 138 mg/dL — ABNORMAL HIGH (ref 70–99)
Glucose-Capillary: 120 mg/dL — ABNORMAL HIGH (ref 70–99)
Glucose-Capillary: 88 mg/dL (ref 70–99)

## 2021-06-13 NOTE — Progress Notes (Addendum)
PROGRESS NOTE    Ryan [name]  [id number] DOB: 20-Dec-1960 DOA: 06/02/2021 PCP: Pcp, No   Brief Narrative: Taken from H&P. Ryan [name] is a 60 y.o. 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   Patient was recently hospitalized from 6/21-8/24 due to metastasized prostate cancer. Pt was seen by [name] of oncology. He got his last prostate cancer treatment yesterday. He was just discharged from the hospital yesterday. He was supposed to go to a homeless shelter but apparently there were issues when he was transported there. He sates that he he felt dizzy and fell accidentally. No LOC. He injured his left wrist and left ankle, causing severe pain in left ankle and left wrist.    Left wrist x-ray with fracture of distal radius and ulna.  CT left ankle with impacted distal tibial and fibular fractures, mild posterior displacement and angulation.  Orthopedic was consulted and they are recommending conservative management based on his other comorbidities and advanced malignancy.  Patient will be nonweightbearing her left lower extremity.  CT head without any new abnormality.  Prior osseous mets noted.  PT/OT is recommending SNF.  Still pending Medicaid application.  TOC is working on placement.  Subjective: No complaints today.  Taking many naps during the day.   Assessment & Plan:   Principal Problem:   Closed left ankle fracture Active Problems:   Fall   Anemia of chronic disease   Protein-calorie malnutrition, severe (HCC)   Cancer cachexia (HCC)   Prostate cancer metastatic to bone (HCC)   Left wrist fracture, closed, initial encounter   Hypocalcemia   Hyperkalemia   Closed fracture of left distal radius  Closed left ankle fracture and left wrist fracture, closed, initial encounter: consulted [name] of ortho, given patient's metastasized  prostate cancer, recommended conservative treatment.  Patient will be nonweightbearing on left lower extremity and follow-up with orthopedic as an outpatient. -Continue with pain management -PT/OT is recommending SNF. -TOC is trying-difficult placement -Asked nursing staff to encourage sitting in chair.  Anemia of chronic disease.  Hemoglobin stable around 8.2, no obvious bleeding.  Patient also received his chemotherapy a day before. -Continue to monitor -Transfuse if below 7.  Hyperkalemia.  Resolved. -Continue to monitor  Severe protein caloric malnutrition/cancer cachexia. -Continue with Ensure  Prostate cancer metastatic to bone [facility]): Consulted [name].  Per [name], Pt was seen in hospital in previous [place]. He got his last prostate cancer treatment yesterday, is not due for another 4 weeks; already has out patient follow up scheduled.    Hypocalcemia.   Corrected calcium of 6.9.  Received total of 3 g of calcium gluconate IV. Vitamin D levels more than 200. -Continue with calcium supplement -Continue to monitor  Anxiety and depression. -Continue with hydroxyzine and Remeron  Severe protein caloric malnutrition. Estimated body mass index is 13.59 kg/m as calculated from the following:   Height as of this encounter: '6\' 5"'$  (1.956 m).   Weight as of this encounter: 52 kg.  -Dietitian consult   Objective: Vitals:   06/13/21 0531 06/13/21 0840 06/13/21 1153 06/13/21 1536  BP: 115/69 112/68 118/66 98/66  Pulse: 93 96 92 100  Resp: '18 18 18 16  '$ Temp: 98 F (36.7 C) 98.2 F (36.8 C) 98.4 F (36.9 C) 98.4 F (36.9 C)  TempSrc: Oral  Oral   SpO2: 98% 99% 99% 99%  Weight:  Height:        Intake/Output Summary (Last 24 hours) at 06/13/2021 1642 Last data filed at 06/13/2021 0531 Gross per 24 hour  Intake --  Output 0 ml  Net 0 ml   Filed Weights   06/02/21 1634  Weight: 52 kg    Examination:  Constitutional: NAD, AAOx3 HEENT: conjunctivae and  lids normal, EOMI CV: No cyanosis.   RESP: normal respiratory effort, on RA Extremities: left arm and left leg in splints SKIN: warm, dry Neuro: II - XII grossly intact.     DVT prophylaxis: Lovenox Code Status: DNR Family Communication: Disposition Plan: aunt updated at bedside today. Status is: Inpatient  Dispo: The patient is from: Home              Anticipated d/c is to: SNF              Patient currently is medically stable to d/c.   Difficult to place patient Yes              Level of care: Med-Surg  All the records are reviewed and case discussed with Care Management/Social Worker. Management plans discussed with the patient, nursing and they are in agreement.  Consultants:  Orthopedic surgery.  Procedures:  Antimicrobials:   Data Reviewed: I have personally reviewed following labs and imaging studies  CBC: No results for input(s): WBC, NEUTROABS, HGB, HCT, MCV, PLT in the last 168 hours.  Basic Metabolic Panel: Recent Labs  Lab 06/07/21 0438 06/09/21 0430  NA 131* 132*  K 4.8 5.0  CL 104 104  CO2 19* 20*  GLUCOSE 112* 105*  BUN 20 28*  CREATININE 0.55* 0.52*  CALCIUM 6.6* 6.7*  PHOS 2.9 3.8   GFR: Estimated Creatinine Clearance: 73.1 mL/min (A) (by C-G formula based on SCr of 0.52 mg/dL (L)). Liver Function Tests: Recent Labs  Lab 06/07/21 0438 06/09/21 0430  ALBUMIN 3.2* 3.2*   No results for input(s): LIPASE, AMYLASE in the last 168 hours. No results for input(s): AMMONIA in the last 168 hours. Coagulation Profile: No results for input(s): INR, PROTIME in the last 168 hours.  Cardiac Enzymes: No results for input(s): CKTOTAL, CKMB, CKMBINDEX, TROPONINI in the last 168 hours. BNP (last 3 results) No results for input(s): PROBNP in the last 8760 hours. HbA1C: No results for input(s): HGBA1C in the last 72 hours. CBG: Recent Labs  Lab 06/13/21 0843 06/13/21 1150 06/13/21 1602  GLUCAP 138* 120* 88   Lipid Profile: No results for  input(s): CHOL, HDL, LDLCALC, TRIG, CHOLHDL, LDLDIRECT in the last 72 hours. Thyroid Function Tests: No results for input(s): TSH, T4TOTAL, FREET4, T3FREE, THYROIDAB in the last 72 hours. Anemia Panel: No results for input(s): VITAMINB12, FOLATE, FERRITIN, TIBC, IRON, RETICCTPCT in the last 72 hours. Sepsis Labs: No results for input(s): PROCALCITON, LATICACIDVEN in the last 168 hours.  No results found for this or any previous visit (from the past 240 hour(s)).     Radiology Studies: No results found.  Scheduled Meds:  calcium carbonate  1,250 mg Oral TID WC   dexamethasone  4 mg Oral Daily   enoxaparin (LOVENOX) injection  40 mg Subcutaneous Q24H   feeding supplement  237 mL Oral QID   mirtazapine  15 mg Oral QHS   multivitamin with minerals  1 tablet Oral Daily   nicotine  21 mg Transdermal Daily   Continuous Infusions:  sodium chloride Stopped (06/04/21 1216)     LOS: 10 days    Enzo Bi,  MD [facility]  If 7PM-7AM, please contact night-coverage [email or web address]  06/13/2021, 4:42 PM

## 2021-06-14 MED ORDER — TRAMADOL HCL 50 MG PO TABS
50.0000 mg | ORAL_TABLET | Freq: Four times a day (QID) | ORAL | Status: DC | PRN
  Administered 2021-06-16 – 2021-08-28 (×38): 50 mg via ORAL
  Filled 2021-06-14 (×39): qty 1

## 2021-06-14 NOTE — Progress Notes (Signed)
PROGRESS NOTE    Ryan [name]  [id number] DOB: May 08, 1961 DOA: 06/02/2021 PCP: Pcp, No   Brief Narrative: Taken from H&P. Ryan [name] is a 60 y.o. 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   Patient was recently hospitalized from 6/21-8/24 due to metastasized prostate cancer. Pt was seen by [name] of oncology. He got his last prostate cancer treatment yesterday. He was just discharged from the hospital yesterday. He was supposed to go to a homeless shelter but apparently there were issues when he was transported there. He sates that he he felt dizzy and fell accidentally. No LOC. He injured his left wrist and left ankle, causing severe pain in left ankle and left wrist.    Left wrist x-ray with fracture of distal radius and ulna.  CT left ankle with impacted distal tibial and fibular fractures, mild posterior displacement and angulation.  Orthopedic was consulted and they are recommending conservative management based on his other comorbidities and advanced malignancy.  Patient will be nonweightbearing her left lower extremity.  CT head without any new abnormality.  Prior osseous mets noted.  PT/OT is recommending SNF.  Still pending Medicaid application.  TOC is working on placement.  Subjective: Pt reported some wrist pain which improved with pain meds, but otherwise doing fine.     Assessment & Plan:   Principal Problem:   Closed left ankle fracture Active Problems:   Fall   Anemia of chronic disease   Protein-calorie malnutrition, severe (HCC)   Cancer cachexia (HCC)   Prostate cancer metastatic to bone (HCC)   Left wrist fracture, closed, initial encounter   Hypocalcemia   Hyperkalemia   Closed fracture of left distal radius  Closed left ankle fracture and left wrist fracture, closed, initial encounter: consulted [name] of ortho,  given patient's metastasized prostate cancer, recommended conservative treatment.  Patient will be nonweightbearing on left lower extremity and follow-up with orthopedic as an outpatient. -Continue with pain management -PT/OT is recommending SNF. -TOC is trying-difficult placement -Asked nursing staff to encourage sitting in chair.  Anemia of chronic disease.  Hemoglobin stable around 8.2, no obvious bleeding.  Patient also received his chemotherapy a day before. -Continue to monitor -Transfuse if below 7.  Hyperkalemia.  Resolved. -Continue to monitor  Severe protein caloric malnutrition/cancer cachexia. -Continue with Ensure  Prostate cancer metastatic to bone [facility]): Consulted [name].  Per [name], Pt was seen in hospital in previous [place]. He got his last prostate cancer treatment yesterday, is not due for another 4 weeks; already has out patient follow up scheduled.    Hypocalcemia.   Corrected calcium of 6.9.  Received total of 3 g of calcium gluconate IV. Vitamin D levels more than 200. -Continue with calcium supplement -Continue to monitor  Anxiety and depression. -Continue with hydroxyzine and Remeron  Severe protein caloric malnutrition. Estimated body mass index is 13.59 kg/m as calculated from the following:   Height as of this encounter: '6\' 5"'$  (1.956 m).   Weight as of this encounter: 52 kg.  -Dietitian consult   Objective: Vitals:   06/13/21 2101 06/14/21 0500 06/14/21 0923 06/14/21 1140  BP: 112/64 101/63 109/65 105/64  Pulse: 98 96 (!) 102 (!) 102  Resp: '16 16 18 18  '$ Temp: 98.1 F (36.7 C) 97.8 F (36.6 C) 97.6 F (36.4 C) (!) 97.5 F (36.4 C)  TempSrc: Oral Oral Oral Oral  SpO2: 98% 100% 97% 91%  Weight:      Height:        Intake/Output Summary (Last 24 hours) at 06/14/2021 1546 Last data filed at 06/14/2021 0600 Gross per 24 hour  Intake --  Output 3 ml  Net -3 ml   Filed Weights   06/02/21 1634  Weight: 52 kg     Examination:  Constitutional: NAD, AAOx3 HEENT: conjunctivae and lids normal, EOMI CV: No cyanosis.   RESP: normal respiratory effort, on RA Extremities: left arm and left leg in splints SKIN: warm, dry Neuro: II - XII grossly intact.   Psych: Normal mood and affect.     DVT prophylaxis: Lovenox Code Status: DNR Family Communication: Disposition Plan:  Status is: Inpatient  Dispo: The patient is from: Home              Anticipated d/c is to: SNF              Patient currently is medically stable to d/c.   Difficult to place patient Yes              Level of care: Med-Surg  All the records are reviewed and case discussed with Care Management/Social Worker. Management plans discussed with the patient, nursing and they are in agreement.  Consultants:  Orthopedic surgery.  Procedures:  Antimicrobials:   Data Reviewed: I have personally reviewed following labs and imaging studies  CBC: No results for input(s): WBC, NEUTROABS, HGB, HCT, MCV, PLT in the last 168 hours.  Basic Metabolic Panel: Recent Labs  Lab 06/09/21 0430  NA 132*  K 5.0  CL 104  CO2 20*  GLUCOSE 105*  BUN 28*  CREATININE 0.52*  CALCIUM 6.7*  PHOS 3.8   GFR: Estimated Creatinine Clearance: 73.1 mL/min (A) (by C-G formula based on SCr of 0.52 mg/dL (L)). Liver Function Tests: Recent Labs  Lab 06/09/21 0430  ALBUMIN 3.2*   No results for input(s): LIPASE, AMYLASE in the last 168 hours. No results for input(s): AMMONIA in the last 168 hours. Coagulation Profile: No results for input(s): INR, PROTIME in the last 168 hours.  Cardiac Enzymes: No results for input(s): CKTOTAL, CKMB, CKMBINDEX, TROPONINI in the last 168 hours. BNP (last 3 results) No results for input(s): PROBNP in the last 8760 hours. HbA1C: No results for input(s): HGBA1C in the last 72 hours. CBG: Recent Labs  Lab 06/13/21 0843 06/13/21 1150 06/13/21 1602  GLUCAP 138* 120* 88   Lipid Profile: No results for  input(s): CHOL, HDL, LDLCALC, TRIG, CHOLHDL, LDLDIRECT in the last 72 hours. Thyroid Function Tests: No results for input(s): TSH, T4TOTAL, FREET4, T3FREE, THYROIDAB in the last 72 hours. Anemia Panel: No results for input(s): VITAMINB12, FOLATE, FERRITIN, TIBC, IRON, RETICCTPCT in the last 72 hours. Sepsis Labs: No results for input(s): PROCALCITON, LATICACIDVEN in the last 168 hours.  No results found for this or any previous visit (from the past 240 hour(s)).     Radiology Studies: No results found.  Scheduled Meds:  calcium carbonate  1,250 mg Oral TID WC   dexamethasone  4 mg Oral Daily   enoxaparin (LOVENOX) injection  40 mg Subcutaneous Q24H   feeding supplement  237 mL Oral QID   mirtazapine  15 mg Oral QHS   multivitamin with minerals  1 tablet Oral Daily   nicotine  21 mg Transdermal Daily   Continuous Infusions:  sodium chloride Stopped (06/04/21 1216)     LOS: 11 days    Otila Kluver  Billie Ruddy, MD [facility]  If 7PM-7AM, please contact night-coverage [email or web address]  06/14/2021, 3:46 PM

## 2021-06-14 NOTE — Plan of Care (Signed)
No acute events during the night. NAD noted. VSS. PRN percocet given x 1 for left wrist pain. PRN trazadone given.  Problem: Education: Goal: Knowledge of General Education information will improve Description: Including pain rating scale, medication(s)/side effects and non-pharmacologic comfort measures Outcome: Progressing   Problem: Health Behavior/Discharge Planning: Goal: Ability to manage health-related needs will improve Outcome: Progressing   Problem: Clinical Measurements: Goal: Ability to maintain clinical measurements within normal limits will improve Outcome: Progressing Goal: Will remain free from infection Outcome: Progressing Goal: Diagnostic test results will improve Outcome: Progressing Goal: Respiratory complications will improve Outcome: Progressing Goal: Cardiovascular complication will be avoided Outcome: Progressing   Problem: Activity: Goal: Risk for activity intolerance will decrease Outcome: Progressing   Problem: Nutrition: Goal: Adequate nutrition will be maintained Outcome: Progressing   Problem: Coping: Goal: Level of anxiety will decrease Outcome: Progressing   Problem: Elimination: Goal: Will not experience complications related to bowel motility Outcome: Progressing Goal: Will not experience complications related to urinary retention Outcome: Progressing   Problem: Pain Managment: Goal: General experience of comfort will improve Outcome: Progressing   Problem: Safety: Goal: Ability to remain free from injury will improve Outcome: Progressing   Problem: Skin Integrity: Goal: Risk for impaired skin integrity will decrease Outcome: Progressing   Problem: Education: Goal: Knowledge of the prescribed therapeutic regimen will improve Outcome: Progressing   Problem: Activity: Goal: Ability to increase mobility will improve Outcome: Progressing   Problem: Pain Management: Goal: Pain level will decrease with appropriate  interventions Outcome: Progressing

## 2021-06-15 LAB — BASIC METABOLIC PANEL
Anion gap: 7 (ref 5–15)
BUN: 19 mg/dL (ref 6–20)
CO2: 19 mmol/L — ABNORMAL LOW (ref 22–32)
Calcium: 7.6 mg/dL — ABNORMAL LOW (ref 8.9–10.3)
Chloride: 105 mmol/L (ref 98–111)
Creatinine, Ser: 0.45 mg/dL — ABNORMAL LOW (ref 0.61–1.24)
GFR, Estimated: >60 mL/min (ref >60)
Glucose, Bld: 133 mg/dL — ABNORMAL HIGH (ref 70–99)
Potassium: 4.2 mmol/L (ref 3.5–5.1)
Sodium: 131 mmol/L — ABNORMAL LOW (ref 135–145)

## 2021-06-15 LAB — CBC
HCT: 28 % — ABNORMAL LOW (ref 39.0–52.0)
Hemoglobin: 8.5 g/dL — ABNORMAL LOW (ref 13.0–17.0)
MCH: 27.4 pg (ref 26.0–34.0)
MCHC: 30.4 g/dL (ref 30.0–36.0)
MCV: 90.3 fL (ref 80.0–100.0)
Platelets: 215 10*3/uL (ref 150–400)
RBC: 3.1 MIL/uL — ABNORMAL LOW (ref 4.22–5.81)
RDW: 18 % — ABNORMAL HIGH (ref 11.5–15.5)
WBC: 7.1 10*3/uL (ref 4.0–10.5)
nRBC: 0 % (ref 0.0–0.2)

## 2021-06-15 LAB — MAGNESIUM: Magnesium: 1.6 mg/dL — ABNORMAL LOW (ref 1.7–2.4)

## 2021-06-15 MED ORDER — ONDANSETRON 4 MG PO TBDP
4.0000 mg | ORAL_TABLET | Freq: Three times a day (TID) | ORAL | Status: DC | PRN
  Administered 2021-07-28 – 2021-08-31 (×8): 4 mg via ORAL
  Filled 2021-06-15 (×11): qty 1

## 2021-06-15 MED ORDER — MAGNESIUM OXIDE -MG SUPPLEMENT 400 (240 MG) MG PO TABS
400.0000 mg | ORAL_TABLET | Freq: Every day | ORAL | Status: DC
  Administered 2021-06-15 – 2021-08-31 (×77): 400 mg via ORAL
  Filled 2021-06-15 (×79): qty 1

## 2021-06-15 MED ORDER — MAGNESIUM SULFATE 2 GM/50ML IV SOLN
2.0000 g | Freq: Once | INTRAVENOUS | Status: AC
  Administered 2021-06-15: 2 g via INTRAVENOUS
  Filled 2021-06-15: qty 50

## 2021-06-15 MED ORDER — DEXAMETHASONE 0.5 MG PO TABS
2.0000 mg | ORAL_TABLET | Freq: Every day | ORAL | Status: DC
  Administered 2021-06-16 – 2021-06-22 (×7): 2 mg via ORAL
  Filled 2021-06-15 (×7): qty 4

## 2021-06-15 NOTE — TOC Progression Note (Signed)
Transition of Care [facility]) - Progression Note    Patient Details  Name: Ryan [name] MRN: [id number] Date of Birth: 01-05-1961  Transition of Care [facility]) CM/SW Contact  Su Hilt, RN Phone Number: 06/15/2021, 11:25 AM  Clinical Narrative:     Reached out to [facility] in [place] via phone, Spoke with [place], she has no beds available nat this time, fax number is [phone]       Expected Discharge Plan and Services                                                 Social Determinants of Health (SDOH) Interventions    Readmission Risk Interventions Readmission Risk Prevention Plan 04/23/2021  Transportation Screening Complete  PCP or Specialist Appt within 3-5 Days Complete  HRI or [place] Complete  Social Work Consult for [place] Planning/Counseling Not Complete  SW consult not completed comments RNCM assigned to patient  Palliative Care Screening Complete  Medication Review [employer]) Complete  Some recent data might be hidden

## 2021-06-15 NOTE — TOC Progression Note (Signed)
Transition of Care [facility]) - Progression Note    Patient Details  Name: Ryan [name] MRN: [id number] Date of Birth: 1961-04-04  Transition of Care [facility]) CM/SW Theodosia, RN Phone Number: 06/15/2021, 4:23 PM  Clinical Narrative:     Social Services stopped in and gave an update that [facility] is considering taking the patient, They will send me Sarah's information at [place] to contact for a bed       Expected Discharge Plan and Services                                                 Social Determinants of Health (SDOH) Interventions    Readmission Risk Interventions Readmission Risk Prevention Plan 04/23/2021  Transportation Screening Complete  PCP or Specialist Appt within 3-5 Days Complete  HRI or [place] Complete  Social Work Consult for [place] Planning/Counseling Not Complete  SW consult not completed comments RNCM assigned to patient  Palliative Care Screening Complete  Medication Review [employer]) Complete  Some recent data might be hidden

## 2021-06-15 NOTE — Progress Notes (Signed)
PROGRESS NOTE    Ryan [name]  [id number] DOB: 05-11-1961 DOA: 06/02/2021 PCP: Pcp, No   Brief Narrative: Taken from H&P. Ryan [name] is a 60 y.o. 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   Patient was recently hospitalized from 6/21-8/24 due to metastasized prostate cancer. Pt was seen by [name] of oncology. He got his last prostate cancer treatment yesterday. He was just discharged from the hospital yesterday. He was supposed to go to a homeless shelter but apparently there were issues when he was transported there. He sates that he he felt dizzy and fell accidentally. No LOC. He injured his left wrist and left ankle, causing severe pain in left ankle and left wrist.    Left wrist x-ray with fracture of distal radius and ulna.  CT left ankle with impacted distal tibial and fibular fractures, mild posterior displacement and angulation.  Orthopedic was consulted and they are recommending conservative management based on his other comorbidities and advanced malignancy.  Patient will be nonweightbearing her left lower extremity.  CT head without any new abnormality.  Prior osseous mets noted.  PT/OT is recommending SNF.  Still pending Medicaid application.  TOC is working on placement.  Subjective: Pt noted to be sleeping a lot, and doesn't like to be out of bed.   Assessment & Plan:   Principal Problem:   Closed left ankle fracture Active Problems:   Fall   Anemia of chronic disease   Protein-calorie malnutrition, severe (HCC)   Cancer cachexia (HCC)   Prostate cancer metastatic to bone (HCC)   Left wrist fracture, closed, initial encounter   Hypocalcemia   Hyperkalemia   Closed fracture of left distal radius  Closed left ankle fracture and left wrist fracture, closed, initial encounter: consulted [name] of ortho, given patient's  metastasized prostate cancer, recommended conservative treatment.  Patient will be nonweightbearing on left lower extremity and follow-up with orthopedic as an outpatient. --tramadol PRN -PT/OT is recommending SNF. -TOC is trying-difficult placement -Asked nursing staff to encourage sitting in chair.  Anemia of chronic disease.  Hemoglobin stable around 8.2, no obvious bleeding.  Patient also received his chemotherapy a day before. -Continue to monitor -Transfuse if below 7.  Hyperkalemia.  Resolved. --weekly labs to monitor  Severe protein caloric malnutrition/cancer cachexia. --gained weight, eating well now. --noted that pt has been on decadron 4 mg daily for a long time, presumably for appetite stimulation. Plan: Continue with Ensure --start decadron taper, decrease Decadron to 2 mg daily today (06/15/21)  Prostate cancer metastatic to bone [facility]): Consulted [name].  Per [name], Pt was seen in hospital in previous [place].  --cont monthly prostate cancer treatment  Hypocalcemia.   Corrected calcium of 6.9.  Received total of 3 g of calcium gluconate IV. Vitamin D levels more than 200. -Continue with calcium supplement  Anxiety and depression. --cont Remeron --cont Atarax PRN  Insomnia --trazodone 100 mg nightly PRN  Severe protein caloric malnutrition. Estimated body mass index is 13.59 kg/m as calculated from the following:   Height as of this encounter: '6\' 5"'$  (1.956 m).   Weight as of this encounter: 52 kg.  -Dietitian consult   Objective: Vitals:   06/14/21 2148 06/15/21 0534 06/15/21 0905 06/15/21 1008  BP: (!) 101/59 (!) 109/54 96/61 111/60  Pulse: (!) 109 (!) 110 (!) 114 (!) 113  Resp: '17 17 17   '$ Temp: Marland Kitchen)  97.3 F (36.3 C) 98.2 F (36.8 C) 98.2 F (36.8 C)   TempSrc:      SpO2: 98% 96% 97% 96%  Weight:      Height:       No intake or output data in the 24 hours ending 06/15/21 1530  Filed Weights   06/02/21 1634  Weight: 52 kg     Examination:  Constitutional: NAD CV: No cyanosis.   RESP: normal respiratory effort, on RA Extremities: No effusions, edema in BLE.  Left arm and left leg in splints. SKIN: warm, dry   DVT prophylaxis: Lovenox Code Status: DNR Family Communication: Disposition Plan:  Status is: Inpatient  Dispo: The patient is from: Home              Anticipated d/c is to: SNF              Patient currently is medically stable to d/c.   Difficult to place patient Yes              Level of care: Med-Surg  All the records are reviewed and case discussed with Care Management/Social Worker. Management plans discussed with the patient, nursing and they are in agreement.  Consultants:  Orthopedic surgery.  Procedures:  Antimicrobials:   Data Reviewed: I have personally reviewed following labs and imaging studies  CBC: Recent Labs  Lab 06/15/21 0941  WBC 7.1  HGB 8.5*  HCT 28.0*  MCV 90.3  PLT 123456    Basic Metabolic Panel: Recent Labs  Lab 06/09/21 0430 06/15/21 0941  NA 132* 131*  K 5.0 4.2  CL 104 105  CO2 20* 19*  GLUCOSE 105* 133*  BUN 28* 19  CREATININE 0.52* 0.45*  CALCIUM 6.7* 7.6*  MG  --  1.6*  PHOS 3.8  --    GFR: Estimated Creatinine Clearance: 73.1 mL/min (A) (by C-G formula based on SCr of 0.45 mg/dL (L)). Liver Function Tests: Recent Labs  Lab 06/09/21 0430  ALBUMIN 3.2*   No results for input(s): LIPASE, AMYLASE in the last 168 hours. No results for input(s): AMMONIA in the last 168 hours. Coagulation Profile: No results for input(s): INR, PROTIME in the last 168 hours.  Cardiac Enzymes: No results for input(s): CKTOTAL, CKMB, CKMBINDEX, TROPONINI in the last 168 hours. BNP (last 3 results) No results for input(s): PROBNP in the last 8760 hours. HbA1C: No results for input(s): HGBA1C in the last 72 hours. CBG: Recent Labs  Lab 06/13/21 0843 06/13/21 1150 06/13/21 1602  GLUCAP 138* 120* 88   Lipid Profile: No results for input(s):  CHOL, HDL, LDLCALC, TRIG, CHOLHDL, LDLDIRECT in the last 72 hours. Thyroid Function Tests: No results for input(s): TSH, T4TOTAL, FREET4, T3FREE, THYROIDAB in the last 72 hours. Anemia Panel: No results for input(s): VITAMINB12, FOLATE, FERRITIN, TIBC, IRON, RETICCTPCT in the last 72 hours. Sepsis Labs: No results for input(s): PROCALCITON, LATICACIDVEN in the last 168 hours.  No results found for this or any previous visit (from the past 240 hour(s)).     Radiology Studies: No results found.  Scheduled Meds:  calcium carbonate  1,250 mg Oral TID WC   [START ON 06/16/2021] dexamethasone  2 mg Oral Daily   enoxaparin (LOVENOX) injection  40 mg Subcutaneous Q24H   feeding supplement  237 mL Oral QID   mirtazapine  15 mg Oral QHS   multivitamin with minerals  1 tablet Oral Daily   nicotine  21 mg Transdermal Daily   Continuous Infusions:  sodium  chloride Stopped (06/04/21 1216)     LOS: 12 days    Enzo Bi, MD [facility]  If 7PM-7AM, please contact night-coverage [email or web address]  06/15/2021, 3:30 PM

## 2021-06-15 NOTE — Progress Notes (Signed)
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  Pt recently here for multiple months with difficult d/c planning, did ultimatley d/c to homeless shelter and was gone only a few hours before fall with L ankle and wrist fractures with return to the ED.    PT Comments    Pt was asleep in supine upon arriving. He agrees to session with encouragement. Did apply brief/diaper and supplied platform walker (left in room post session). Pt did agree to OOB activity. He was able to exit L side of bed with min assist + constant vcs for NWB LUE/LLE. He stood 2 x 30 sec at EOB with max vcs and encouragement. Pt struggles to adhere to proper NWB upon standing however once in standing does well to maintain. Pt does fatigue quickly and endorses more pain in UE than LE. Overall pt is progressing but slowly. Acute PT will continue to follow and progress as able per current POC.   Follow Up Recommendations  SNF;Supervision/Assistance - 24 hour     Equipment Recommendations  Other (comment) (If pt were to DC today, would need w/c- plaform walker- hospital bed. DC disposition unknow. Ongoing assessment)       Precautions / Restrictions Precautions Precautions: Fall Precaution Comments: NWB LUE/LLE Required Braces or Orthoses: Splint/Cast Spinal Brace Comments: pt no longer using TLSO brace Splint/Cast: L ankle/LUE Restrictions Weight Bearing Restrictions: Yes LUE Weight Bearing: Non weight bearing LLE Weight Bearing: Non weight bearing    Mobility  Bed Mobility Overal bed mobility: Needs Assistance Bed Mobility: Supine to Sit Rolling: Min assist Sidelying to sit: Min  assist Supine to sit: Min assist Sit to supine: Supervision        Transfers Overall transfer level: Needs assistance Equipment used: Left platform walker;Rolling walker (2 wheeled) Transfers: Sit to/from Stand Sit to Stand: Mod assist;Max assist;From elevated surface         General transfer comment: Pt was able to stand from elevated bed height to to RW/platform LUE with max vcs and mod-max assist. struggles to maintain proper wt bearing however once fully standing is able to adhere. stood 2 x 30 sec.  Ambulation/Gait    General Gait Details: unable/unsafe to do so. recommend use of WC for all longer distance     Balance Overall balance assessment: Needs assistance Sitting-balance support: Feet supported;No upper extremity supported (VCs to try to not use LLE and keep knee extended) Sitting balance-Leahy Scale: Good     Standing balance support: Single extremity supported;During functional activity Standing balance-Leahy Scale: Poor Standing balance comment: pt stood EOB 2 x 30 sec with constant assistance and vcs for posture and adhereing to NWB      Cognition Arousal/Alertness: Awake/alert Behavior During Therapy: Flat affect Overall Cognitive Status: Within Functional Limits for tasks assessed             Pertinent Vitals/Pain Pain Assessment: 0-10 Pain Score: 6  Faces Pain Scale: Hurts little more Pain Location: L ankle, L wrist. Complains of numbness in b/l LE Pain Descriptors / Indicators: Numbness Pain Intervention(s): Limited activity within patient's tolerance;Premedicated before session;Monitored during session;Repositioned     PT Goals (current goals can now be found in the care  plan section) Acute Rehab PT Goals Patient Stated Goal: none stated Progress towards PT goals: Progressing toward goals    Frequency    Min 2X/week      PT Plan Current plan remains appropriate    Co-evaluation     PT goals addressed during session:  Mobility/safety with mobility;Proper use of DME;Strengthening/ROM        AM-PAC PT "6 Clicks" Mobility   Outcome Measure  Help needed turning from your back to your side while in a flat bed without using bedrails?: A Little Help needed moving from lying on your back to sitting on the side of a flat bed without using bedrails?: A Little Help needed moving to and from a bed to a chair (including a wheelchair)?: A Lot Help needed standing up from a chair using your arms (e.g., wheelchair or bedside chair)?: A Lot Help needed to walk in hospital room?: Total Help needed climbing 3-5 steps with a railing? : Total 6 Click Score: 12    End of Session Equipment Utilized During Treatment: Gait belt Activity Tolerance: Patient tolerated treatment well;Patient limited by fatigue Patient left: in bed;with call bell/phone within reach;with bed alarm set Nurse Communication: Mobility status PT Visit Diagnosis: Unsteadiness on feet (R26.81);Muscle weakness (generalized) (M62.81);Difficulty in walking, not elsewhere classified (R26.2);History of falling (Z91.81);Pain Pain - Right/Left: Left Pain - part of body: Ankle and joints of foot     Time: 1525-1545 PT Time Calculation (min) (ACUTE ONLY): 20 min  Charges:  $Therapeutic Activity: 8-22 mins                    Julaine Fusi PTA 06/15/21, 4:31 PM

## 2021-06-15 NOTE — Plan of Care (Signed)
  Problem: Education: Goal: Knowledge of General Education information will improve Description: Including pain rating scale, medication(s)/side effects and non-pharmacologic comfort measures Outcome: Progressing   Problem: Health Behavior/Discharge Planning: Goal: Ability to manage health-related needs will improve Outcome: Progressing   Problem: Clinical Measurements: Goal: Ability to maintain clinical measurements within normal limits will improve Outcome: Progressing Goal: Will remain free from infection Outcome: Progressing Goal: Diagnostic test results will improve Outcome: Progressing Goal: Respiratory complications will improve Outcome: Progressing Goal: Cardiovascular complication will be avoided Outcome: Progressing   Problem: Activity: Goal: Risk for activity intolerance will decrease Outcome: Progressing   Problem: Nutrition: Goal: Adequate nutrition will be maintained Outcome: Progressing   Problem: Coping: Goal: Level of anxiety will decrease Outcome: Progressing   Problem: Elimination: Goal: Will not experience complications related to bowel motility Outcome: Progressing Goal: Will not experience complications related to urinary retention Outcome: Progressing   Problem: Pain Managment: Goal: General experience of comfort will improve Outcome: Progressing   Problem: Safety: Goal: Ability to remain free from injury will improve Outcome: Progressing   Problem: Skin Integrity: Goal: Risk for impaired skin integrity will decrease Outcome: Progressing   Problem: Education: Goal: Knowledge of the prescribed therapeutic regimen will improve Outcome: Progressing   Problem: Activity: Goal: Ability to increase mobility will improve Outcome: Progressing   Problem: Pain Management: Goal: Pain level will decrease with appropriate interventions Outcome: Progressing

## 2021-06-16 MED ORDER — TRAZODONE HCL 50 MG PO TABS
150.0000 mg | ORAL_TABLET | Freq: Every evening | ORAL | Status: DC | PRN
  Administered 2021-06-16 – 2021-07-10 (×19): 150 mg via ORAL
  Filled 2021-06-16 (×19): qty 1

## 2021-06-16 NOTE — TOC Progression Note (Signed)
Transition of Care [facility]) - Progression Note    Patient Details  Name: Ryan [name] MRN: [id number] Date of Birth: 1960-11-15  Transition of Care [facility]) CM/SW [place], RN Phone Number: 06/16/2021, 9:47 AM  Clinical Narrative:   Damaris Schooner with Clarise Cruz at [facility] and they stated that they will not be making a bed offer at this time,  Called and left a vm for Cristy at [facility] in [employer] in [place] and [place] and got the fax number [phone], faxed the information requesting a bed        Expected Discharge Plan and Services                                                 Social Determinants of Health (SDOH) Interventions    Readmission Risk Interventions Readmission Risk Prevention Plan 04/23/2021  Transportation Screening Complete  PCP or Specialist Appt within 3-5 Days Complete  HRI or Home Care Consult Complete  Social Work Consult for [place] Planning/Counseling Not Complete  SW consult not completed comments RNCM assigned to patient  Palliative Care Screening Complete  Medication Review [employer]) Complete  Some recent data might be hidden

## 2021-06-16 NOTE — Progress Notes (Signed)
PROGRESS NOTE    Ryan [name]  [id number] DOB: [birth year] DOA: 06/02/2021 PCP: Pcp, No   Brief Narrative:  60 y.o. 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   Patient was recently hospitalized from 6/21-8/24 due to metastasized prostate cancer. Pt was seen by [name] of oncology. He got his last prostate cancer treatment yesterday. He was just discharged from the hospital yesterday. He was supposed to go to a homeless shelter but apparently there were issues when he was transported there. He sates that he he felt dizzy and fell accidentally. No LOC. He injured his left wrist and left ankle, causing severe pain in left ankle and left wrist.     Left wrist x-ray with fracture of distal radius and ulna.   CT left ankle with impacted distal tibial and fibular fractures, mild posterior displacement and angulation.  Orthopedic was consulted and they are recommending conservative management based on his other comorbidities and advanced malignancy.  Patient will be nonweightbearing her left lower extremity.  Therapy is recommended skilled nursing facility.  Medicaid application is in progress.  TOC working on placement.  As of 9/7 no bed offers.   Assessment & Plan:   Principal Problem:   Closed left ankle fracture Active Problems:   Fall   Anemia of chronic disease   Protein-calorie malnutrition, severe (HCC)   Cancer cachexia (HCC)   Prostate cancer metastatic to bone (HCC)   Left wrist fracture, closed, initial encounter   Hypocalcemia   Hyperkalemia   Closed fracture of left distal radius  Closed left ankle fracture and left wrist fracture, closed, consulted [name] of ortho, given patient's metastasized prostate cancer, recommended conservative treatment.  Patient will be nonweightbearing on left lower extremity and follow-up with orthopedic  as an outpatient. Plan: As needed pain control Continue therapy evaluations TOC looking for placement, difficult to place    Anemia of chronic disease Hemoglobin low but stable, no obvious bleeding Plan: Transfuse PRBC as needed hemoglobin less than 7    Hyperkalemia.  Resolved. --weekly labs to monitor   Severe protein caloric malnutrition/cancer cachexia. --gained weight, eating well now. --noted that pt has been on decadron 4 mg daily for a long time, presumably for appetite stimulation. Plan: Continue with Ensure Decadron decreased to 2 mg daily as of 9/6   Prostate cancer metastatic to bone [facility]) Consulted [name].   Per [name], Pt was seen in hospital in previous [place].  --cont monthly prostate cancer treatment   Hypocalcemia.   Corrected calcium of 6.9.  Received total of 3 g of calcium gluconate IV. Vitamin D levels more than 200. -Continue with calcium supplement   Anxiety and depression. --cont Remeron --cont Atarax PRN   Insomnia -- Trazodone 150 mg nightly as needed, escalate as needed   DVT prophylaxis: SQ Lovenox Code Status: DNR Family Communication: None today Disposition Plan: Status is: Inpatient  Remains inpatient appropriate because:Inpatient level of care appropriate due to severity of illness, unsafe discharge plan  Dispo: The patient is from: Home              Anticipated d/c is to:  TBD              Patient currently is medically stable to d/c.   Difficult to place patient Yes       Level of care: Med-Surg  Consultants:  None  Procedures:  None  Antimicrobials:  None   Subjective: Seen and examined.  Endorsed poor sleep 100 mg trazodone.  Requested dose increase.  No other complaints  Objective: Vitals:   06/16/21 0552 06/16/21 0741 06/16/21 1000 06/16/21 1118  BP:  (!) 97/50  (!) 94/58  Pulse: (!) 114 (!) 116  (!) 119  Resp:  17  16  Temp:  99.5 F (37.5 C)  98.7 F (37.1 C)  TempSrc:      SpO2:  94% 96%  94%  Weight:   59.8 kg   Height:       No intake or output data in the 24 hours ending 06/16/21 1316 Filed Weights   06/02/21 1634 06/16/21 1000  Weight: 52 kg 59.8 kg    Examination:  General exam: No acute distress.  Appears frail Respiratory system: Decreased at bases.  Lungs clear.  Room air Cardiovascular system: S1-S2, RRR, no murmurs, no pedal edema Gastrointestinal system: Thin, NT/ND, normal bowel sounds Central nervous system: Alert and oriented. No focal neurological deficits. Extremities: Symmetric decreased power bilaterally Skin: No rashes, lesions or ulcers Psychiatry: Judgement and insight appear impaired. Mood & affect flattened.     Data Reviewed: I have personally reviewed following labs and imaging studies  CBC: Recent Labs  Lab 06/15/21 0941  WBC 7.1  HGB 8.5*  HCT 28.0*  MCV 90.3  PLT 123456   Basic Metabolic Panel: Recent Labs  Lab 06/15/21 0941  NA 131*  K 4.2  CL 105  CO2 19*  GLUCOSE 133*  BUN 19  CREATININE 0.45*  CALCIUM 7.6*  MG 1.6*   GFR: Estimated Creatinine Clearance: 84.1 mL/min (A) (by C-G formula based on SCr of 0.45 mg/dL (L)). Liver Function Tests: No results for input(s): AST, ALT, ALKPHOS, BILITOT, PROT, ALBUMIN in the last 168 hours. No results for input(s): LIPASE, AMYLASE in the last 168 hours. No results for input(s): AMMONIA in the last 168 hours. Coagulation Profile: No results for input(s): INR, PROTIME in the last 168 hours. Cardiac Enzymes: No results for input(s): CKTOTAL, CKMB, CKMBINDEX, TROPONINI in the last 168 hours. BNP (last 3 results) No results for input(s): PROBNP in the last 8760 hours. HbA1C: No results for input(s): HGBA1C in the last 72 hours. CBG: Recent Labs  Lab 06/13/21 0843 06/13/21 1150 06/13/21 1602  GLUCAP 138* 120* 88   Lipid Profile: No results for input(s): CHOL, HDL, LDLCALC, TRIG, CHOLHDL, LDLDIRECT in the last 72 hours. Thyroid Function Tests: No results for  input(s): TSH, T4TOTAL, FREET4, T3FREE, THYROIDAB in the last 72 hours. Anemia Panel: No results for input(s): VITAMINB12, FOLATE, FERRITIN, TIBC, IRON, RETICCTPCT in the last 72 hours. Sepsis Labs: No results for input(s): PROCALCITON, LATICACIDVEN in the last 168 hours.  No results found for this or any previous visit (from the past 240 hour(s)).       Radiology Studies: No results found.      Scheduled Meds:  calcium carbonate  1,250 mg Oral TID WC   dexamethasone  2 mg Oral Daily   enoxaparin (LOVENOX) injection  40 mg Subcutaneous Q24H   feeding supplement  237 mL Oral QID   magnesium oxide  400 mg Oral Daily   mirtazapine  15 mg Oral QHS   multivitamin with minerals  1 tablet Oral Daily   nicotine  21 mg Transdermal Daily   Continuous Infusions:  sodium chloride Stopped (06/04/21 1216)     LOS: 13 days    Time spent: 25 minutes  Sidney Ace, MD [facility] Pager [phone]  If 7PM-7AM, please contact night-coverage  06/16/2021, 1:16 PM

## 2021-06-16 NOTE — Plan of Care (Signed)
  Problem: Education: Goal: Knowledge of General Education information will improve Description: Including pain rating scale, medication(s)/side effects and non-pharmacologic comfort measures Outcome: Progressing   Problem: Health Behavior/Discharge Planning: Goal: Ability to manage health-related needs will improve Outcome: Progressing   Problem: Clinical Measurements: Goal: Ability to maintain clinical measurements within normal limits will improve Outcome: Progressing Goal: Will remain free from infection Outcome: Progressing Goal: Diagnostic test results will improve Outcome: Progressing Goal: Respiratory complications will improve Outcome: Progressing Goal: Cardiovascular complication will be avoided Outcome: Progressing   Problem: Activity: Goal: Risk for activity intolerance will decrease Outcome: Progressing   Problem: Nutrition: Goal: Adequate nutrition will be maintained Outcome: Progressing   Problem: Coping: Goal: Level of anxiety will decrease Outcome: Progressing   Problem: Pain Managment: Goal: General experience of comfort will improve Outcome: Progressing   Problem: Skin Integrity: Goal: Risk for impaired skin integrity will decrease Outcome: Progressing   Problem: Education: Goal: Knowledge of the prescribed therapeutic regimen will improve Outcome: Progressing   Problem: Pain Management: Goal: Pain level will decrease with appropriate interventions Outcome: Progressing

## 2021-06-16 NOTE — Progress Notes (Addendum)
Nutrition Follow-up  DOCUMENTATION CODES:  Severe malnutrition in context of chronic illness, Underweight  INTERVENTION:  Continue current diet as ordered Continue Ensure Enlive po QID, each supplement provides 350 kcal and 20 grams of protein Continue MVI with minerals daily Continue snacks TID  NUTRITION DIAGNOSIS:  Severe Malnutrition related to chronic illness, cancer and cancer related treatments as evidenced by severe fat depletion, severe muscle depletion.  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 Recently admitted 6/21-8/24. Discharged to homeless shelter then returned to ED a few hours later after a fall and pain to his wrist.  Imaging in the emergency department showed a distal radius fracture and a left distal tibia and fibula fracture. Pt declined surgical interventions at this time.  Pt resting in bed at the time of visit, watching TV. Pt in good spirits today. States that he has been eating more and that he is drinking all of the ensures that are offered to him. Visibly less depleted on exam, face has regained fullness. Encouraged pt to continue to try and eat and continue with ensures. Pt agreeable.   Requested new weight, pt has gained weight since being discharged.  Average Meal Intake: 8/27-8/29: 72% intake x 2 recorded meals (50-94%) 8/30-9/7: 63% intake x 2 recorded meals (25-100%)  Nutritionally Relevant Medications: Scheduled Meds:  calcium carbonate  1,250 mg Oral TID WC   dexamethasone  2 mg Oral Daily   feeding supplement  237 mL Oral QID   magnesium oxide  400 mg Oral Daily   mirtazapine  15 mg Oral QHS   multivitamin with minerals  1 tablet Oral Daily   PRN Meds:  ondansetron  Labs Reviewed 9/6: Na 131 Creatinine .47 Mg 1.6  NUTRITION - FOCUSED PHYSICAL EXAM: Flowsheet Row Most Recent Value  Orbital Region Mild depletion  Upper Arm Region Severe depletion  Thoracic and Lumbar Region Severe depletion  Buccal Region Mild depletion  Temple Region Moderate depletion  Clavicle Bone Region Severe depletion  Clavicle and Acromion Bone Region Severe depletion  Scapular Bone Region Severe depletion  Dorsal Hand Severe depletion  Patellar Region Severe depletion  Anterior Thigh Region Severe depletion  Posterior Calf Region Severe depletion  Edema (RD Assessment) None  Hair Reviewed  Eyes Reviewed  Mouth Reviewed  Skin Reviewed  Nails Reviewed    Diet Order:   Diet Order             Diet regular Room service appropriate? Yes; Fluid consistency: Thin  Diet effective now                  EDUCATION NEEDS:  Education needs have been addressed  Skin:  Skin Assessment: Reviewed RN Assessment  Last BM:  8/31 - type 2  Height:  Ht Readings from Last 1 Encounters:  06/02/21 '6\' 5"'$  (1.956 m)   Weight:  Wt Readings from Last 1 Encounters:  06/02/21 52 kg   Ideal Body Weight:  94.5 kg  BMI:  Body mass index is 13.59 kg/m.  Estimated Nutritional Needs:  Kcal:  2400-2600 Protein:  130-150 grams Fluid:  >2.4 L  Ranell Patrick, RD, LDN Clinical Dietitian Pager on [place]

## 2021-06-16 NOTE — NC FL2 (Signed)
[place] LEVEL OF CARE SCREENING TOOL     IDENTIFICATION  Patient Name: Ryan [name] Birthdate: 06-15-1961 Sex: male Admission Date (Current Location): 06/02/2021  [facility] and [place] Number:  Engineering geologist and Address:  [facility], [address] [place], [place] [address]      Provider Number: [id number]  Attending Physician Name and Address:  Sidney Ace, MD  Relative Name and Phone Number:  Fredrich Romans [phone]    Current Level of Care: Hospital Recommended Level of Care: [place] Prior Approval Number:    Date Approved/Denied:   PASRR Number: [id number]  Discharge Plan: SNF    Current Diagnoses: Patient Active Problem List   Diagnosis Date Noted   Closed fracture of left distal radius    Closed left ankle fracture 06/03/2021   Left wrist fracture, closed, initial encounter 06/03/2021   Hypocalcemia 06/03/2021   Hyperkalemia 06/03/2021   Hypotension 04/07/2021   Prostate cancer metastatic to bone ([place]) 04/06/2021   Anemia of chronic disease 03/30/2021   Thrombocytopenia ([place]) 03/30/2021   Thoracic compression fracture, closed, initial encounter ([place]) 03/30/2021   Pathologic fracture of thoracic vertebrae, initial encounter 03/30/2021   Malnourished ([place]) 03/30/2021   Protein-calorie malnutrition, severe ([place]) 03/30/2021   Cancer cachexia ([place]) 03/30/2021   Infestation by bed bug 03/30/2021   Fall    Transaminitis    Pain    Closed right hip fracture ([place]) 04/01/2020   Symptomatic anemia 04/01/2020   Hyponatremia 04/01/2020   Closed rib fracture 04/01/2020   Leukocytosis 04/01/2020   Rhabdomyolysis 04/01/2020   Dehydration 04/01/2020    Orientation RESPIRATION BLADDER Height & Weight     Self, Time, Situation, Place  Normal Continent Weight: 52 kg Height:  '6\' 5"'$  (195.6 cm)  BEHAVIORAL SYMPTOMS/MOOD NEUROLOGICAL BOWEL NUTRITION STATUS      Continent Diet (Regular)   AMBULATORY STATUS COMMUNICATION OF NEEDS Skin   Extensive Assist Verbally Normal                       Personal Care Assistance Level of Assistance  Bathing, Dressing Bathing Assistance: Limited assistance   Dressing Assistance: Limited assistance     Functional Limitations Info             SPECIAL CARE FACTORS FREQUENCY  PT (By licensed PT), OT (By licensed OT)     PT Frequency: 5 times per week OT Frequency: 5 times per week            Contractures Contractures Info: Not present    Additional Factors Info  Code Status, Allergies Code Status Info: DNR Allergies Info: NKDA           Current Medications (06/16/2021):  This is the current hospital active medication list Current Facility-Administered Medications  Medication Dose Route Frequency Provider Last Rate Last Admin   0.9 %  sodium chloride infusion   Intravenous PRN Lorella Nimrod, MD   Stopped at 06/04/21 1216   acetaminophen (TYLENOL) 160 MG/5ML solution 650 mg  650 mg Oral Q6H PRN Ivor Costa, MD       calcium carbonate (OS-CAL - dosed in mg of elemental calcium) tablet 1,250 mg  1,250 mg Oral TID WC Ivor Costa, MD   1,250 mg at 06/15/21 1803   dexamethasone (DECADRON) tablet 2 mg  2 mg Oral Daily Enzo Bi, MD       enoxaparin (LOVENOX) injection 40 mg  40 mg Subcutaneous Q24H Ivor Costa, MD  40 mg at 06/04/21 2250   feeding supplement (ENSURE ENLIVE / ENSURE PLUS) liquid 237 mL  237 mL Oral QID Lorella Nimrod, MD   237 mL at 06/15/21 2119   hydrOXYzine (ATARAX/VISTARIL) tablet 25 mg  25 mg Oral TID PRN Ivor Costa, MD   25 mg at 06/07/21 0545   magnesium oxide (MAG-OX) tablet 400 mg  400 mg Oral Daily Enzo Bi, MD   400 mg at 06/15/21 2122   mirtazapine (REMERON) tablet 15 mg  15 mg Oral QHS Ivor Costa, MD   15 mg at 06/15/21 2117   multivitamin with minerals tablet 1 tablet  1 tablet Oral Daily Lorella Nimrod, MD   1 tablet at 06/15/21 0857   nicotine (NICODERM CQ - dosed in mg/24 hours) patch 21 mg   21 mg Transdermal Daily Ivor Costa, MD   21 mg at 06/15/21 0857   ondansetron (ZOFRAN) injection 4 mg  4 mg Intravenous Q8H PRN Ivor Costa, MD   4 mg at 06/16/21 0644   ondansetron (ZOFRAN-ODT) disintegrating tablet 4 mg  4 mg Oral Q8H PRN Enzo Bi, MD       traMADol Veatrice [place]) tablet 50 mg  50 mg Oral Q6H PRN Enzo Bi, MD   50 mg at 06/16/21 0650   traZODone (DESYREL) tablet 150 mg  150 mg Oral QHS PRN Sidney Ace, MD         Discharge Medications: Please see discharge summary for a list of discharge medications.  Relevant Imaging Results:  Relevant Lab Results:   Additional Information SS# [id number]  Su Hilt, RN

## 2021-06-17 NOTE — Progress Notes (Addendum)
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  Pt recently here for multiple months with difficult d/c planning, did ultimatley d/c to homeless shelter and was gone only a few hours before fall with L ankle and wrist fractures with return to the ED.   Clinical Impression   [name] seen today for re-evaluation, goals of care updated to reflect pt's NWB status on LLE and LUE, as well as decline in fxl mobility post fractures/readmission. Mr. Hathorn performed well today, completing UB and LB dressing with SUPV and bed mobility with CGA. He required Mod A + 2 for sit<stand and SPT, but was able to remaining in standing with L platform walker for 2+ minutes and reported that he felt encouraged by this progress (recent previous attempts at standing lasted 30 seconds or less). Provided educ re: importance of OOB activity and WB on R LE. Pt engaged in seated therapeutic activity, with good balance. Will continue to follow POC.    Follow Up Recommendations  SNF    Equipment Recommendations  None recommended by OT    Recommendations for Other Services       Precautions / Restrictions Precautions Precautions: Fall Precaution Comments: NWB LUE/LLE Required Braces or Orthoses: Splint/Cast Splint/Cast: L ankle/LUE Restrictions Weight Bearing Restrictions: Yes LUE Weight Bearing: Non weight bearing LLE Weight Bearing: Non weight bearing      Mobility Bed Mobility Overal bed mobility: Needs Assistance Bed Mobility: Supine to Sit     Supine to sit: Min guard Sit to supine: Min guard   General bed mobility comments: increased time and effort required.     Transfers Overall transfer level: Needs assistance Equipment used: Left platform walker;Rolling walker (2 wheeled) Transfers: Sit to/from Stand Sit to Stand: Mod assist;+2 physical assistance Stand pivot transfers: Mod assist;+2 physical assistance       General transfer comment: verbal cues for technique to maintain NWB of LLE. patient has difficulty maintaining weight bearing status despite verbal cues    Balance Overall balance assessment: Needs assistance Sitting-balance support: Feet supported;No upper extremity supported Sitting balance-Leahy Scale: Good     Standing balance support: During functional activity;Bilateral upper extremity supported Standing balance-Leahy Scale: Poor Standing balance comment: standing tolerance 90 seconds. Min A required to maintain standing balance                           ADL either performed or assessed with clinical judgement   ADL Overall ADL's : Needs assistance/impaired Eating/Feeding: Set up;Modified independent               Upper Body Dressing : Supervision Upper Body Dressing Details (indicate cue type and reason): CGA for donning/doffing gown Lower Body Dressing: Supervision Lower Body Dressing Details (indicate cue type and reason): Mod I for donning socks and underwear, from bed level, using bridging to pull up underwear                     Vision         Perception     Praxis      Pertinent Vitals/Pain Pain Assessment: No/denies pain Faces Pain Scale: Hurts little more  Pain Intervention(s): Repositioned;Monitored during session     Hand Dominance     Extremity/Trunk Assessment             Communication     Cognition Arousal/Alertness: Awake/alert Behavior During Therapy: WFL for tasks assessed/performed Overall Cognitive Status: Within Functional Limits for tasks assessed                                 General Comments: needs cues for attention task, easily  distracted   General Comments  patient has difficulty maintaining NWB of left wrist and LLE despite verbal cues for technique    Exercises Other Exercises Other Exercises: UB and LB dressing, bed mobility, transfers, educ re: NWB precautions, standing balance tolerance, therapeutic activity   Shoulder Instructions      Home Living                                          Prior Functioning/Environment                   OT Problem List:        OT Treatment/Interventions:      OT Goals(Current goals can be found in the care plan section) Acute Rehab OT Goals Patient Stated Goal: to get stronger OT Goal Formulation: With patient Time For Goal Achievement: 07/01/21 Potential to Achieve Goals: Good ADL Goals Pt Will Perform Grooming: with modified independence;sitting Pt Will Transfer to Toilet: with min assist;bedside commode;stand pivot transfer  OT Frequency: Min 1X/week   Barriers to D/C:            Co-evaluation              AM-PAC OT "6 Clicks" Daily Activity     Outcome Measure Help from another person eating meals?: None Help from another person taking care of personal grooming?: A Little Help from another person toileting, which includes using toliet, bedpan, or urinal?: A Lot Help from another person bathing (including washing, rinsing, drying)?: A Lot Help from another person to put on and taking off regular upper body clothing?: A Little Help from another person to put on and taking off regular lower body clothing?: A Little 6 Click Score: 17   End of Session Equipment Utilized During Treatment: Rolling walker;Other (comment) (L platform walker)  Activity Tolerance: Patient tolerated treatment well Patient left: in chair;with call bell/phone within reach;with chair alarm set;Other (comment) (w/ PT in room)  OT Visit Diagnosis: Unsteadiness on feet (R26.81);Muscle weakness (generalized) (M62.81);Adult, failure to thrive  (R62.7) Pain - Right/Left: Left Pain - part of body: Leg;Arm;Hand                Time: [id number] OT Time Calculation (min): 53 min Charges:  OT General Charges $OT Visit: 1 Visit OT Evaluation $OT Re-eval: 1 Re-eval OT Treatments $Self Care/Home Management : 23-37 mins $Therapeutic Activity: 23-37 mins  Josiah Lobo, PhD, MS, OTR/L 06/17/21, 3:54 PM

## 2021-06-17 NOTE — Progress Notes (Signed)
PROGRESS NOTE    Ryan [name]  [id number] DOB: Oct 01, 1961 DOA: 06/02/2021 PCP: Pcp, No   Brief Narrative:  60 y.o. 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   Patient was recently hospitalized from 6/21-8/24 due to metastasized prostate cancer. Pt was seen by [name] of oncology. He got his last prostate cancer treatment yesterday. He was just discharged from the hospital yesterday. He was supposed to go to a homeless shelter but apparently there were issues when he was transported there. He sates that he he felt dizzy and fell accidentally. No LOC. He injured his left wrist and left ankle, causing severe pain in left ankle and left wrist.     Left wrist x-ray with fracture of distal radius and ulna.   CT left ankle with impacted distal tibial and fibular fractures, mild posterior displacement and angulation.  Orthopedic was consulted and they are recommending conservative management based on his other comorbidities and advanced malignancy.  Patient will be nonweightbearing her left lower extremity.  Therapy is recommended skilled nursing facility.  Medicaid application is in progress.  TOC working on placement.  As of 9/7 no bed offers.   Assessment & Plan:   Principal Problem:   Closed left ankle fracture Active Problems:   Fall   Anemia of chronic disease   Protein-calorie malnutrition, severe (HCC)   Cancer cachexia (HCC)   Prostate cancer metastatic to bone (HCC)   Left wrist fracture, closed, initial encounter   Hypocalcemia   Hyperkalemia   Closed fracture of left distal radius  Closed left ankle fracture and left wrist fracture, closed, consulted [name] of ortho, given patient's metastasized prostate cancer, recommended conservative treatment.  Patient will be nonweightbearing on left lower extremity and follow-up with orthopedic  as an outpatient. Plan: As needed pain control Continue therapy evaluations TOC looking for placement, difficult to place    Anemia of chronic disease Hemoglobin low but stable, no obvious bleeding Plan: Transfuse PRBC as needed hemoglobin less than 7    Hyperkalemia.  Resolved. --weekly labs to monitor   Severe protein caloric malnutrition/cancer cachexia. --gained weight, eating well now. --noted that pt has been on decadron 4 mg daily for a long time, presumably for appetite stimulation. Plan: Continue with Ensure Decadron decreased to 2 mg daily as of 9/6   Prostate cancer metastatic to bone [facility]) Consulted [name].   Per [name], Pt was seen in hospital in previous [place].  --cont monthly prostate cancer treatment   Hypocalcemia.   Corrected calcium of 6.9.  Received total of 3 g of calcium gluconate IV. Vitamin D levels more than 200. -Continue with calcium supplement   Anxiety and depression. --cont Remeron --cont Atarax PRN   Insomnia -- Continue trazodone 150 mg nightly   DVT prophylaxis: SQ Lovenox Code Status: DNR Family Communication: None today Disposition Plan: Status is: Inpatient  Remains inpatient appropriate because:Inpatient level of care appropriate due to severity of illness, unsafe discharge plan  Dispo: The patient is from: Home              Anticipated d/c is to:  TBD              Patient currently is medically stable to d/c.   Difficult to place patient Yes       Level of care: Med-Surg  Consultants:  None  Procedures:  None  Antimicrobials:  None   Subjective: Seen and examined.  No acute distress.  Objective: Vitals:   06/16/21 2144 06/16/21 2309 06/17/21 0626 06/17/21 0910  BP: 113/70 (!) 113/55 110/63 107/65  Pulse: (!) 115 (!) 112 (!) 109 (!) 110  Resp: '17 17 17 18  '$ Temp: 98.4 F (36.9 C) 98.4 F (36.9 C) 98.5 F (36.9 C) 98.2 F (36.8 C)  TempSrc:      SpO2: 94% 93% 97% 96%  Weight:       Height:        Intake/Output Summary (Last 24 hours) at 06/17/2021 1050 Last data filed at 06/17/2021 1043 Gross per 24 hour  Intake 480 ml  Output --  Net 480 ml   Filed Weights   06/02/21 1634 06/16/21 1000  Weight: 52 kg 59.8 kg    Examination:  General exam: No acute distress.  Appears frail Respiratory system: Decreased at bases.  Lungs clear.  Room air Cardiovascular system: S1-S2, RRR, no murmurs, no pedal edema Gastrointestinal system: Thin, NT/ND, normal bowel sounds Central nervous system: Alert and oriented. No focal neurological deficits. Extremities: Symmetric decreased power bilaterally Skin: No rashes, lesions or ulcers Psychiatry: Judgement and insight appear impaired. Mood & affect flattened.     Data Reviewed: I have personally reviewed following labs and imaging studies  CBC: Recent Labs  Lab 06/15/21 0941  WBC 7.1  HGB 8.5*  HCT 28.0*  MCV 90.3  PLT [id number]   Basic Metabolic Panel: Recent Labs  Lab 06/15/21 0941  NA 131*  K 4.2  CL 105  CO2 19*  GLUCOSE 133*  BUN 19  CREATININE 0.45*  CALCIUM 7.6*  MG 1.6*   GFR: Estimated Creatinine Clearance: 84.1 mL/min (A) (by C-G formula based on SCr of 0.45 mg/dL (L)). Liver Function Tests: No results for input(s): AST, ALT, ALKPHOS, BILITOT, PROT, ALBUMIN in the last 168 hours. No results for input(s): LIPASE, AMYLASE in the last 168 hours. No results for input(s): AMMONIA in the last 168 hours. Coagulation Profile: No results for input(s): INR, PROTIME in the last 168 hours. Cardiac Enzymes: No results for input(s): CKTOTAL, CKMB, CKMBINDEX, TROPONINI in the last 168 hours. BNP (last 3 results) No results for input(s): PROBNP in the last 8760 hours. HbA1C: No results for input(s): HGBA1C in the last 72 hours. CBG: Recent Labs  Lab 06/13/21 0843 06/13/21 1150 06/13/21 1602  GLUCAP 138* 120* 88   Lipid Profile: No results for input(s): CHOL, HDL, LDLCALC, TRIG, CHOLHDL, LDLDIRECT in the  last 72 hours. Thyroid Function Tests: No results for input(s): TSH, T4TOTAL, FREET4, T3FREE, THYROIDAB in the last 72 hours. Anemia Panel: No results for input(s): VITAMINB12, FOLATE, FERRITIN, TIBC, IRON, RETICCTPCT in the last 72 hours. Sepsis Labs: No results for input(s): PROCALCITON, LATICACIDVEN in the last 168 hours.  No results found for this or any previous visit (from the past 240 hour(s)).       Radiology Studies: No results found.      Scheduled Meds:  calcium carbonate  1,250 mg Oral TID WC   dexamethasone  2 mg Oral Daily   enoxaparin (LOVENOX) injection  40 mg Subcutaneous Q24H   feeding supplement  237 mL Oral QID   magnesium oxide  400 mg Oral Daily   mirtazapine  15 mg Oral QHS   multivitamin with minerals  1 tablet Oral Daily   nicotine  21 mg Transdermal Daily   Continuous Infusions:  sodium chloride Stopped (06/04/21 1216)     LOS: 14 days  Time spent: 15 minutes    Sidney Ace, MD [facility] Pager [phone]  If 7PM-7AM, please contact night-coverage  06/17/2021, 10:50 AM

## 2021-06-17 NOTE — Progress Notes (Signed)
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  Pt recently here for multiple months with difficult d/c planning, did ultimatley d/c to homeless shelter and was gone only a few hours before fall with L ankle and wrist fractures with return to the ED.   PT Comments    Patient agreeable to PT. He was sitting up in chair on arrival to room with OT present. Patient continues to require assistance with transfers and has difficulty maintaining weight bearing status of LLE despite verbal cues for technique. Patient is deconditioned overall and fatigued with standing tolerance of 90 seconds. Recommend to continue PT to maximize independence and decrease caregiver burden.    Follow Up Recommendations  SNF;Supervision/Assistance - 24 hour     Equipment Recommendations   (ongoing assessment based on discharge destination)    Recommendations for Other Services       Precautions / Restrictions Precautions Precautions: Fall Precaution Comments: NWB LUE/LLE Required Braces or Orthoses: Splint/Cast Splint/Cast: L ankle/LUE Restrictions Weight Bearing Restrictions: Yes LUE Weight Bearing: Non weight bearing LLE Weight Bearing: Non weight bearing    Mobility  Bed Mobility Overal bed mobility: Needs Assistance Bed Mobility: Supine to Sit     Supine to sit: Min guard Sit to supine: Min guard   General bed mobility comments: increased time and effort required.    Transfers Overall transfer level: Independent Equipment used: Left platform walker;Rolling walker (2 wheeled) Transfers: Sit to/from Stand Sit to Stand: Mod assist;+2 physical assistance Stand pivot  transfers: Min assist;+2 physical assistance       General transfer comment: verbal cues for technique to maintain NWB of LLE. patient has difficulty maintaining weight bearing status despite verbal cues  Ambulation/Gait             General Gait Details: unable to safely attempt due to poor standing tolerance and fatigue with minimal activity. patient has difficulty maintaining NWB of LLE in standing position also.   Stairs             Wheelchair Mobility    Modified Rankin (Stroke Patients Only)       Balance Overall balance assessment: Needs assistance Sitting-balance support: Feet supported;No upper extremity supported Sitting balance-Leahy Scale: Good     Standing balance support: During functional activity;Bilateral upper extremity supported Standing balance-Leahy Scale: Poor Standing balance comment: standing tolerance 90 seconds. Min A required to maintain standing balance                            Cognition Arousal/Alertness: Awake/alert Behavior During Therapy: WFL for tasks assessed/performed Overall Cognitive Status: Within Functional Limits for tasks assessed                                 General Comments: needs cues for attention task, easily distracted      Exercises Other Exercises Other Exercises: UB and LB dressing, bed mobility, transfers, educ re: NWB precautions, standing balance tolerance, therapeutic activity    General Comments General comments (skin integrity, edema, etc.): patient has difficulty maintaining NWB of left wrist and LLE despite verbal  cues for technique      Pertinent Vitals/Pain Pain Assessment: No/denies pain Faces Pain Scale: Hurts little more Pain Intervention(s): Repositioned;Monitored during session    Home Living                      Prior Function            PT Goals (current goals can now be found in the care plan section) Acute Rehab PT Goals Patient Stated  Goal: to get stronger PT Goal Formulation: With patient Time For Goal Achievement: 07/01/21 Potential to Achieve Goals: Fair Progress towards PT goals: Progressing toward goals    Frequency    Min 2X/week      PT Plan Current plan remains appropriate    Co-evaluation              AM-PAC PT "6 Clicks" Mobility   Outcome Measure  Help needed turning from your back to your side while in a flat bed without using bedrails?: A Little Help needed moving from lying on your back to sitting on the side of a flat bed without using bedrails?: A Little Help needed moving to and from a bed to a chair (including a wheelchair)?: A Lot Help needed standing up from a chair using your arms (e.g., wheelchair or bedside chair)?: A Lot Help needed to walk in hospital room?: Total Help needed climbing 3-5 steps with a railing? : Total 6 Click Score: 12    End of Session Equipment Utilized During Treatment: Gait belt Activity Tolerance: Patient tolerated treatment well;Patient limited by fatigue Patient left: in bed;with call bell/phone within reach;with bed alarm set   PT Visit Diagnosis: Unsteadiness on feet (R26.81);Muscle weakness (generalized) (M62.81);Difficulty in walking, not elsewhere classified (R26.2);History of falling (Z91.81)     Time: [id number] PT Time Calculation (min) (ACUTE ONLY): 37 min  Charges:  $Therapeutic Activity: 23-37 mins                     Minna Merritts, PT, MPT    [name] 06/17/2021, 3:54 PM

## 2021-06-18 LAB — CBC WITH DIFFERENTIAL/PLATELET
Abs Immature Granulocytes: 0.2 10*3/uL — ABNORMAL HIGH (ref 0.00–0.07)
Basophils Absolute: 0 10*3/uL (ref 0.0–0.1)
Basophils Relative: 0 %
Eosinophils Absolute: 0.1 10*3/uL (ref 0.0–0.5)
Eosinophils Relative: 2 %
HCT: 27.3 % — ABNORMAL LOW (ref 39.0–52.0)
Hemoglobin: 8.4 g/dL — ABNORMAL LOW (ref 13.0–17.0)
Immature Granulocytes: 3 %
Lymphocytes Relative: 19 %
Lymphs Abs: 1.3 10*3/uL (ref 0.7–4.0)
MCH: 28.2 pg (ref 26.0–34.0)
MCHC: 30.8 g/dL (ref 30.0–36.0)
MCV: 91.6 fL (ref 80.0–100.0)
Monocytes Absolute: 1 10*3/uL (ref 0.1–1.0)
Monocytes Relative: 14 %
Neutro Abs: 4.1 10*3/uL (ref 1.7–7.7)
Neutrophils Relative %: 62 %
Platelets: 238 10*3/uL (ref 150–400)
RBC: 2.98 MIL/uL — ABNORMAL LOW (ref 4.22–5.81)
RDW: 17.1 % — ABNORMAL HIGH (ref 11.5–15.5)
Smear Review: NORMAL
WBC Morphology: ABNORMAL
WBC: 6.7 10*3/uL (ref 4.0–10.5)
nRBC: 0 % (ref 0.0–0.2)

## 2021-06-18 LAB — BASIC METABOLIC PANEL
Anion gap: 6 (ref 5–15)
BUN: 15 mg/dL (ref 6–20)
CO2: 20 mmol/L — ABNORMAL LOW (ref 22–32)
Calcium: 7.9 mg/dL — ABNORMAL LOW (ref 8.9–10.3)
Chloride: 110 mmol/L (ref 98–111)
Creatinine, Ser: 0.56 mg/dL — ABNORMAL LOW (ref 0.61–1.24)
GFR, Estimated: >60 mL/min (ref >60)
Glucose, Bld: 124 mg/dL — ABNORMAL HIGH (ref 70–99)
Potassium: 5.2 mmol/L — ABNORMAL HIGH (ref 3.5–5.1)
Sodium: 136 mmol/L (ref 135–145)

## 2021-06-18 LAB — MAGNESIUM: Magnesium: 1.8 mg/dL (ref 1.7–2.4)

## 2021-06-18 MED ORDER — SODIUM ZIRCONIUM CYCLOSILICATE 5 G PO PACK
5.0000 g | PACK | Freq: Once | ORAL | Status: AC
  Administered 2021-06-18: 5 g via ORAL
  Filled 2021-06-18: qty 1

## 2021-06-18 MED ORDER — MAGNESIUM SULFATE 2 GM/50ML IV SOLN
2.0000 g | Freq: Once | INTRAVENOUS | Status: AC
  Administered 2021-06-18: 2 g via INTRAVENOUS
  Filled 2021-06-18: qty 50

## 2021-06-18 NOTE — Progress Notes (Signed)
PROGRESS NOTE    Ryan [name]  [id number] DOB: [birth year] DOA: 06/02/2021 PCP: Pcp, No   Brief Narrative:  60 y.o. 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   Patient was recently hospitalized from 6/21-8/24 due to metastasized prostate cancer. Pt was seen by [name] of oncology. He got his last prostate cancer treatment yesterday. He was just discharged from the hospital yesterday. He was supposed to go to a homeless shelter but apparently there were issues when he was transported there. He sates that he he felt dizzy and fell accidentally. No LOC. He injured his left wrist and left ankle, causing severe pain in left ankle and left wrist.     Left wrist x-ray with fracture of distal radius and ulna.   CT left ankle with impacted distal tibial and fibular fractures, mild posterior displacement and angulation.  Orthopedic was consulted and they are recommending conservative management based on his other comorbidities and advanced malignancy.  Patient will be nonweightbearing her left lower extremity.  Therapy is recommended skilled nursing facility.  Medicaid application is in progress.  TOC working on placement.  As of 9/7 no bed offers.   Assessment & Plan:   Principal Problem:   Closed left ankle fracture Active Problems:   Fall   Anemia of chronic disease   Protein-calorie malnutrition, severe (HCC)   Cancer cachexia (HCC)   Prostate cancer metastatic to bone (HCC)   Left wrist fracture, closed, initial encounter   Hypocalcemia   Hyperkalemia   Closed fracture of left distal radius  Closed left ankle fracture and left wrist fracture, closed, consulted [name] of ortho, given patient's metastasized prostate cancer, recommended conservative treatment.  Patient will be nonweightbearing on left lower extremity and follow-up with orthopedic  as an outpatient. Plan: As needed pain control Continue therapy evaluations TOC looking for placement, difficult to place Encourage patient not to refuse physical therapy evaluations    Anemia of chronic disease Hemoglobin low but stable, no obvious bleeding Plan: Transfuse PRBC as needed hemoglobin less than 7    Hyperkalemia.  Resolved. --weekly labs to monitor   Severe protein caloric malnutrition/cancer cachexia. --gained weight, eating well now. --noted that pt has been on decadron 4 mg daily for a long time, presumably for appetite stimulation. Plan: Continue with Ensure Decadron decreased to 2 mg daily as of 9/6   Prostate cancer metastatic to bone [facility]) Consulted [name].   Per [name], Pt was seen in hospital in previous [place].  --cont monthly prostate cancer treatment   Hypocalcemia.   Corrected calcium of 6.9.  Received total of 3 g of calcium gluconate IV. Vitamin D levels more than 200. -Continue with calcium supplement   Anxiety and depression. --cont Remeron --cont Atarax PRN   Insomnia -- Continue trazodone 150 mg nightly   DVT prophylaxis: SQ Lovenox Code Status: DNR Family Communication: None today Disposition Plan: Status is: Inpatient  Remains inpatient appropriate because:Inpatient level of care appropriate due to severity of illness, unsafe discharge plan  Dispo: The patient is from: Home              Anticipated d/c is to:  TBD              Patient currently is medically stable to d/c.   Difficult to place patient Yes       Level of care: Med-Surg  Consultants:  None  Procedures:  None  Antimicrobials:  None   Subjective: Seen and examined.  No acute distress.  Endorses improved sleep  Objective: Vitals:   06/17/21 2323 06/18/21 0511 06/18/21 0814 06/18/21 1210  BP: 105/70 105/62 102/62 106/65  Pulse: (!) 110 (!) 110 99 (!) 118  Resp: '16 16 16 16  '$ Temp: 97.8 F (36.6 C) 98.2 F (36.8 C) 98.8 F (37.1  C) 98 F (36.7 C)  TempSrc:  Oral Oral   SpO2: 96% 98% 98% 94%  Weight:      Height:        Intake/Output Summary (Last 24 hours) at 06/18/2021 1249 Last data filed at 06/18/2021 0550 Gross per 24 hour  Intake 240 ml  Output 200 ml  Net 40 ml   Filed Weights   06/02/21 1634 06/16/21 1000  Weight: 52 kg 59.8 kg    Examination:  General exam: No acute distress.  Appears frail Respiratory system: Decreased at bases.  Lungs clear.  Room air Cardiovascular system: S1-S2, RRR, no murmurs, no pedal edema Gastrointestinal system: Thin, NT/ND, normal bowel sounds Central nervous system: Alert and oriented. No focal neurological deficits. Extremities: Symmetric decreased power bilaterally Skin: No rashes, lesions or ulcers Psychiatry: Judgement and insight appear impaired. Mood & affect flattened.     Data Reviewed: I have personally reviewed following labs and imaging studies  CBC: Recent Labs  Lab 06/15/21 0941  WBC 7.1  HGB 8.5*  HCT 28.0*  MCV 90.3  PLT [id number]   Basic Metabolic Panel: Recent Labs  Lab 06/15/21 0941  NA 131*  K 4.2  CL 105  CO2 19*  GLUCOSE 133*  BUN 19  CREATININE 0.45*  CALCIUM 7.6*  MG 1.6*   GFR: Estimated Creatinine Clearance: 84.1 mL/min (A) (by C-G formula based on SCr of 0.45 mg/dL (L)). Liver Function Tests: No results for input(s): AST, ALT, ALKPHOS, BILITOT, PROT, ALBUMIN in the last 168 hours. No results for input(s): LIPASE, AMYLASE in the last 168 hours. No results for input(s): AMMONIA in the last 168 hours. Coagulation Profile: No results for input(s): INR, PROTIME in the last 168 hours. Cardiac Enzymes: No results for input(s): CKTOTAL, CKMB, CKMBINDEX, TROPONINI in the last 168 hours. BNP (last 3 results) No results for input(s): PROBNP in the last 8760 hours. HbA1C: No results for input(s): HGBA1C in the last 72 hours. CBG: Recent Labs  Lab 06/13/21 0843 06/13/21 1150 06/13/21 1602  GLUCAP 138* 120* 88   Lipid  Profile: No results for input(s): CHOL, HDL, LDLCALC, TRIG, CHOLHDL, LDLDIRECT in the last 72 hours. Thyroid Function Tests: No results for input(s): TSH, T4TOTAL, FREET4, T3FREE, THYROIDAB in the last 72 hours. Anemia Panel: No results for input(s): VITAMINB12, FOLATE, FERRITIN, TIBC, IRON, RETICCTPCT in the last 72 hours. Sepsis Labs: No results for input(s): PROCALCITON, LATICACIDVEN in the last 168 hours.  No results found for this or any previous visit (from the past 240 hour(s)).       Radiology Studies: No results found.      Scheduled Meds:  calcium carbonate  1,250 mg Oral TID WC   dexamethasone  2 mg Oral Daily   enoxaparin (LOVENOX) injection  40 mg Subcutaneous Q24H   feeding supplement  237 mL Oral QID   magnesium oxide  400 mg Oral Daily   mirtazapine  15 mg Oral QHS   multivitamin with minerals  1 tablet Oral Daily   nicotine  21 mg Transdermal Daily   Continuous Infusions:  sodium  chloride Stopped (06/04/21 1216)     LOS: 15 days    Time spent: 15 minutes    Sidney Ace, MD [facility] Pager [phone]  If 7PM-7AM, please contact night-coverage  06/18/2021, 12:49 PM

## 2021-06-18 NOTE — Progress Notes (Signed)
   06/18/21 1837  Provider Notification  Provider Name/Title Ralene Muskrat, MD  Date Provider Notified 06/18/21  Time Provider Notified [phone]  Notification Type Page  Notification Reason Other (Comment)  Test performed and critical result Potassium 5.2, Mag 1.8  Provider response See new orders  Date of Provider Response 06/18/21  Time of Provider Response [phone]

## 2021-06-18 NOTE — Progress Notes (Signed)
PT Cancellation Note  Patient Details Name: Ryan [name] MRN: [id number] DOB: 1961/03/14   Cancelled Treatment:    Reason Eval/Treat Not Completed: Other (comment) Pt sleeping on second attempt.(X2 hours after initial refusal)   [name] A [name] 06/18/2021, 11:17 AM

## 2021-06-18 NOTE — Progress Notes (Signed)
PT Cancellation Note  Patient Details Name: Ryan [name] MRN: [id number] DOB: [birth year]   Cancelled Treatment:    Reason Eval/Treat Not Completed: Patient declined, no reason specified Pt reporting that he needs to take a nap at this time. Agreeable to PT following up if time permits.    Zeke Aker A Porshia Blizzard 06/18/2021, 10:46 AM

## 2021-06-18 NOTE — Progress Notes (Signed)
   06/18/21 1500  Assess: MEWS Score  Temp 97.6 F (36.4 C)  BP 109/65  Pulse Rate (!) 115  Resp 18  Level of Consciousness Alert  SpO2 97 %  O2 Device Room Air  Assess: MEWS Score  MEWS Temp 0  MEWS Systolic 0  MEWS Pulse 2  MEWS RR 0  MEWS LOC 0  MEWS Score 2  MEWS Score Color Yellow  Assess: if the MEWS score is Yellow or Red  Were vital signs taken at a resting state? Yes  Focused Assessment No change from prior assessment  Does the patient meet 2 or more of the SIRS criteria? No  Does the patient have a confirmed or suspected source of infection? No  MEWS guidelines implemented *See Row Information* Yes  Treat  Pain Scale 0-10  Pain Score 0  Take Vital Signs  Increase Vital Sign Frequency  Yellow: Q 2hr X 2 then Q 4hr X 2, if remains yellow, continue Q 4hrs  Escalate  MEWS: Escalate Yellow: discuss with charge nurse/RN and consider discussing with provider and RRT  Notify: Charge Nurse/RN  Name of Charge Nurse/RN Notified Haley,RN  Date Charge Nurse/RN Notified 06/18/21  Time Charge Nurse/RN Notified 1500  Notify: Provider  Provider Name/Title Louanne Belton Sreenath,MD  Date Provider Notified 06/18/21  Time Provider Notified 1510  Notification Type Page  Notification Reason Other (Comment) (Yellow MEWS)  Provider response No new orders (continue to monitor)  Date of Provider Response 06/18/21  Time of Provider Response 1519  Document  Patient Outcome Stabilized after interventions  Progress note created (see row info) Yes  Assess: SIRS CRITERIA  SIRS Temperature  0  SIRS Pulse 1  SIRS Respirations  0  SIRS WBC 0  SIRS Score Sum  1

## 2021-06-18 NOTE — Plan of Care (Signed)
  Problem: Education: Goal: Knowledge of General Education information will improve Description: Including pain rating scale, medication(s)/side effects and non-pharmacologic comfort measures Outcome: Progressing   Problem: Health Behavior/Discharge Planning: Goal: Ability to manage health-related needs will improve Outcome: Progressing   Problem: Clinical Measurements: Goal: Ability to maintain clinical measurements within normal limits will improve Outcome: Progressing Goal: Will remain free from infection Outcome: Progressing Goal: Diagnostic test results will improve Outcome: Progressing Goal: Respiratory complications will improve Outcome: Progressing Goal: Cardiovascular complication will be avoided Outcome: Progressing   Problem: Activity: Goal: Risk for activity intolerance will decrease Outcome: Progressing   Problem: Nutrition: Goal: Adequate nutrition will be maintained Outcome: Progressing   Problem: Coping: Goal: Level of anxiety will decrease Outcome: Progressing   Problem: Elimination: Goal: Will not experience complications related to bowel motility Outcome: Progressing Goal: Will not experience complications related to urinary retention Outcome: Progressing   Problem: Pain Managment: Goal: General experience of comfort will improve Outcome: Progressing   Problem: Safety: Goal: Ability to remain free from injury will improve Outcome: Progressing   Problem: Skin Integrity: Goal: Risk for impaired skin integrity will decrease Outcome: Progressing   Problem: Education: Goal: Knowledge of the prescribed therapeutic regimen will improve Outcome: Progressing   Problem: Activity: Goal: Ability to increase mobility will improve Outcome: Progressing   Problem: Pain Management: Goal: Pain level will decrease with appropriate interventions Outcome: Progressing

## 2021-06-18 NOTE — Progress Notes (Signed)
   06/18/21 1652  Assess: MEWS Score  Temp 98.1 F (36.7 C)  BP 100/63  Pulse Rate (!) 108  Resp 16  Level of Consciousness Alert  SpO2 100 %  O2 Device Room Air  Assess: MEWS Score  MEWS Temp 0  MEWS Systolic 1  MEWS Pulse 1  MEWS RR 0  MEWS LOC 0  MEWS Score 2  MEWS Score Color Yellow  Assess: if the MEWS score is Yellow or Red  Were vital signs taken at a resting state? Yes  Focused Assessment No change from prior assessment  Does the patient meet 2 or more of the SIRS criteria? No  Does the patient have a confirmed or suspected source of infection? No  MEWS guidelines implemented *See Row Information* No, previously yellow, continue vital signs every 4 hours  Treat  Pain Scale 0-10  Pain Score 0  Notify: Provider  Provider Name/Title Rock Nephew  Date Provider Notified 06/18/21  Time Provider Notified 1702  Notification Type Page  Notification Reason  (Yellow MEWS and sweating.)  Date of Provider Response 06/18/21  Time of Provider Response 1702  Document  Patient Outcome Stabilized after interventions  Progress note created (see row info) Yes  Assess: SIRS CRITERIA  SIRS Temperature  0  SIRS Pulse 1  SIRS Respirations  0  SIRS WBC 0  SIRS Score Sum  1

## 2021-06-19 NOTE — Plan of Care (Signed)
No adverse events during shift. Stable condition will continue to monitor.  Problem: Education: Goal: Knowledge of General Education information will improve Description: Including pain rating scale, medication(s)/side effects and non-pharmacologic comfort measures Outcome: Progressing   Problem: Health Behavior/Discharge Planning: Goal: Ability to manage health-related needs will improve Outcome: Progressing

## 2021-06-19 NOTE — Plan of Care (Signed)
  Problem: Education: Goal: Knowledge of General Education information will improve Description: Including pain rating scale, medication(s)/side effects and non-pharmacologic comfort measures Outcome: Progressing   Problem: Health Behavior/Discharge Planning: Goal: Ability to manage health-related needs will improve Outcome: Progressing   Problem: Clinical Measurements: Goal: Ability to maintain clinical measurements within normal limits will improve Outcome: Progressing Goal: Will remain free from infection Outcome: Progressing Goal: Diagnostic test results will improve Outcome: Progressing Goal: Respiratory complications will improve Outcome: Progressing Goal: Cardiovascular complication will be avoided Outcome: Progressing   Problem: Activity: Goal: Risk for activity intolerance will decrease Outcome: Progressing   Problem: Nutrition: Goal: Adequate nutrition will be maintained Outcome: Progressing   Problem: Coping: Goal: Level of anxiety will decrease Outcome: Progressing   Problem: Elimination: Goal: Will not experience complications related to bowel motility Outcome: Progressing Goal: Will not experience complications related to urinary retention Outcome: Progressing   Problem: Pain Managment: Goal: General experience of comfort will improve Outcome: Progressing   Problem: Safety: Goal: Ability to remain free from injury will improve Outcome: Progressing   Problem: Skin Integrity: Goal: Risk for impaired skin integrity will decrease Outcome: Progressing   Problem: Education: Goal: Knowledge of the prescribed therapeutic regimen will improve Outcome: Progressing   Problem: Activity: Goal: Ability to increase mobility will improve Outcome: Progressing   Problem: Pain Management: Goal: Pain level will decrease with appropriate interventions Outcome: Progressing

## 2021-06-19 NOTE — Progress Notes (Signed)
PROGRESS NOTE    Ryan [name]  [id number] DOB: 11-23-1960 DOA: 06/02/2021 PCP: Pcp, No   Brief Narrative:  60 y.o. 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   Patient was recently hospitalized from 6/21-8/24 due to metastasized prostate cancer. Pt was seen by [name] of oncology. He got his last prostate cancer treatment yesterday. He was just discharged from the hospital yesterday. He was supposed to go to a homeless shelter but apparently there were issues when he was transported there. He sates that he he felt dizzy and fell accidentally. No LOC. He injured his left wrist and left ankle, causing severe pain in left ankle and left wrist.     Left wrist x-ray with fracture of distal radius and ulna.   CT left ankle with impacted distal tibial and fibular fractures, mild posterior displacement and angulation.  Orthopedic was consulted and they are recommending conservative management based on his other comorbidities and advanced malignancy.  Patient will be nonweightbearing her left lower extremity.  Therapy is recommended skilled nursing facility.  Medicaid application is in progress.  TOC working on placement.  As of 9/7 no bed offers.   Assessment & Plan:   Principal Problem:   Closed left ankle fracture Active Problems:   Fall   Anemia of chronic disease   Protein-calorie malnutrition, severe (HCC)   Cancer cachexia (HCC)   Prostate cancer metastatic to bone (HCC)   Left wrist fracture, closed, initial encounter   Hypocalcemia   Hyperkalemia   Closed fracture of left distal radius  Closed left ankle fracture and left wrist fracture, closed, consulted [name] of ortho, given patient's metastasized prostate cancer, recommended conservative treatment.  Patient will be nonweightbearing on left lower extremity and follow-up with orthopedic  as an outpatient. Plan: As needed pain control Continue therapy evaluations TOC looking for placement, difficult to place Continue to encourage working with physical therapy    Anemia of chronic disease Hemoglobin low but stable, no obvious bleeding Plan: Transfuse PRBC as needed hemoglobin less than 7    Hyperkalemia.  Resolved. --weekly labs to monitor   Severe protein caloric malnutrition/cancer cachexia. --gained weight, eating well now. --noted that pt has been on decadron 4 mg daily for a long time, presumably for appetite stimulation. Plan: Continue with Ensure Decadron decreased to 2 mg daily as of 9/6   Prostate cancer metastatic to bone [facility]) Consulted [name].   Per [name], Pt was seen in hospital in previous [place].  --cont monthly prostate cancer treatment   Hypocalcemia.   Corrected calcium of 6.9.  Received total of 3 g of calcium gluconate IV. Vitamin D levels more than 200. -Continue with calcium supplement   Anxiety and depression. --cont Remeron --cont Atarax PRN   Insomnia -- Continue trazodone 150 mg nightly   DVT prophylaxis: SQ Lovenox Code Status: DNR Family Communication: None today Disposition Plan: Status is: Inpatient  Remains inpatient appropriate because:Inpatient level of care appropriate due to severity of illness, unsafe discharge plan  Dispo: The patient is from: Home              Anticipated d/c is to:  TBD              Patient currently is medically stable to d/c.   Difficult to place patient Yes       Level of care: Med-Surg  Consultants:  None  Procedures:  None  Antimicrobials:  None   Subjective: Seen and examined.  No acute distress.  No complaints  Objective: Vitals:   06/18/21 1846 06/18/21 2310 06/19/21 0354 06/19/21 0726  BP: (!) 106/55 (!) 110/53 108/60 106/60  Pulse: (!) 105 (!) 107 (!) 102 97  Resp: '18 17 17 16  '$ Temp: 99 F (37.2 C) 97.7 F (36.5 C) 98.6 F (37 C) 98.7 F  (37.1 C)  TempSrc:      SpO2: 98% 98% 95% 96%  Weight:      Height:        Intake/Output Summary (Last 24 hours) at 06/19/2021 1102 Last data filed at 06/19/2021 0500 Gross per 24 hour  Intake 0 ml  Output 350 ml  Net -350 ml   Filed Weights   06/02/21 1634 06/16/21 1000  Weight: 52 kg 59.8 kg    Examination:  General exam: No acute distress.  Appears frail Respiratory system: Decreased at bases.  Lungs clear.  Room air Cardiovascular system: S1-S2, RRR, no murmurs, no pedal edema Gastrointestinal system: Thin, NT/ND, normal bowel sounds Central nervous system: Alert and oriented. No focal neurological deficits. Extremities: Symmetric decreased power bilaterally Skin: No rashes, lesions or ulcers Psychiatry: Judgement and insight appear impaired. Mood & affect flattened.     Data Reviewed: I have personally reviewed following labs and imaging studies  CBC: Recent Labs  Lab 06/15/21 0941 06/18/21 1802  WBC 7.1 6.7  NEUTROABS  --  4.1  HGB 8.5* 8.4*  HCT 28.0* 27.3*  MCV 90.3 91.6  PLT 215 [id number]   Basic Metabolic Panel: Recent Labs  Lab 06/15/21 0941 06/18/21 1802  NA 131* 136  K 4.2 5.2*  CL 105 110  CO2 19* 20*  GLUCOSE 133* 124*  BUN 19 15  CREATININE 0.45* 0.56*  CALCIUM 7.6* 7.9*  MG 1.6* 1.8   GFR: Estimated Creatinine Clearance: 84.1 mL/min (A) (by C-G formula based on SCr of 0.56 mg/dL (L)). Liver Function Tests: No results for input(s): AST, ALT, ALKPHOS, BILITOT, PROT, ALBUMIN in the last 168 hours. No results for input(s): LIPASE, AMYLASE in the last 168 hours. No results for input(s): AMMONIA in the last 168 hours. Coagulation Profile: No results for input(s): INR, PROTIME in the last 168 hours. Cardiac Enzymes: No results for input(s): CKTOTAL, CKMB, CKMBINDEX, TROPONINI in the last 168 hours. BNP (last 3 results) No results for input(s): PROBNP in the last 8760 hours. HbA1C: No results for input(s): HGBA1C in the last 72  hours. CBG: Recent Labs  Lab 06/13/21 0843 06/13/21 1150 06/13/21 1602  GLUCAP 138* 120* 88   Lipid Profile: No results for input(s): CHOL, HDL, LDLCALC, TRIG, CHOLHDL, LDLDIRECT in the last 72 hours. Thyroid Function Tests: No results for input(s): TSH, T4TOTAL, FREET4, T3FREE, THYROIDAB in the last 72 hours. Anemia Panel: No results for input(s): VITAMINB12, FOLATE, FERRITIN, TIBC, IRON, RETICCTPCT in the last 72 hours. Sepsis Labs: No results for input(s): PROCALCITON, LATICACIDVEN in the last 168 hours.  No results found for this or any previous visit (from the past 240 hour(s)).       Radiology Studies: No results found.      Scheduled Meds:  calcium carbonate  1,250 mg Oral TID WC   dexamethasone  2 mg Oral Daily   enoxaparin (LOVENOX) injection  40 mg Subcutaneous Q24H   feeding supplement  237 mL Oral QID   magnesium oxide  400 mg Oral Daily   mirtazapine  15 mg Oral  QHS   multivitamin with minerals  1 tablet Oral Daily   nicotine  21 mg Transdermal Daily   Continuous Infusions:  sodium chloride Stopped (06/04/21 1216)     LOS: 16 days    Time spent: 15 minutes    Sidney Ace, MD [facility] Pager [phone]  If 7PM-7AM, please contact night-coverage  06/19/2021, 11:02 AM

## 2021-06-20 NOTE — Plan of Care (Signed)
  Problem: Education: Goal: Knowledge of General Education information will improve Description: Including pain rating scale, medication(s)/side effects and non-pharmacologic comfort measures Outcome: Progressing   Problem: Health Behavior/Discharge Planning: Goal: Ability to manage health-related needs will improve Outcome: Progressing   Problem: Clinical Measurements: Goal: Ability to maintain clinical measurements within normal limits will improve Outcome: Progressing Goal: Will remain free from infection Outcome: Progressing Goal: Diagnostic test results will improve Outcome: Progressing Goal: Respiratory complications will improve Outcome: Progressing Goal: Cardiovascular complication will be avoided Outcome: Progressing   Problem: Activity: Goal: Risk for activity intolerance will decrease Outcome: Progressing   Problem: Nutrition: Goal: Adequate nutrition will be maintained Outcome: Progressing   Problem: Coping: Goal: Level of anxiety will decrease Outcome: Progressing   Problem: Elimination: Goal: Will not experience complications related to bowel motility Outcome: Progressing Goal: Will not experience complications related to urinary retention Outcome: Progressing   Problem: Pain Managment: Goal: General experience of comfort will improve Outcome: Progressing   Problem: Safety: Goal: Ability to remain free from injury will improve Outcome: Progressing   Problem: Skin Integrity: Goal: Risk for impaired skin integrity will decrease Outcome: Progressing   Problem: Education: Goal: Knowledge of the prescribed therapeutic regimen will improve Outcome: Progressing   Problem: Activity: Goal: Ability to increase mobility will improve Outcome: Progressing   Problem: Pain Management: Goal: Pain level will decrease with appropriate interventions Outcome: Progressing

## 2021-06-20 NOTE — Progress Notes (Signed)
PROGRESS NOTE    Ryan [name]  [id number] DOB: 1960-10-23 DOA: 06/02/2021 PCP: Pcp, No   Brief Narrative:  60 y.o. 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   Patient was recently hospitalized from 6/21-8/24 due to metastasized prostate cancer. Pt was seen by [name] of oncology. He got his last prostate cancer treatment yesterday. He was just discharged from the hospital yesterday. He was supposed to go to a homeless shelter but apparently there were issues when he was transported there. He sates that he he felt dizzy and fell accidentally. No LOC. He injured his left wrist and left ankle, causing severe pain in left ankle and left wrist.     Left wrist x-ray with fracture of distal radius and ulna.   CT left ankle with impacted distal tibial and fibular fractures, mild posterior displacement and angulation.  Orthopedic was consulted and they are recommending conservative management based on his other comorbidities and advanced malignancy.  Patient will be nonweightbearing her left lower extremity.  Therapy is recommended skilled nursing facility.  Medicaid application is in progress.  TOC working on placement.  As of 9/7 no bed offers.   Assessment & Plan:   Principal Problem:   Closed left ankle fracture Active Problems:   Fall   Anemia of chronic disease   Protein-calorie malnutrition, severe (HCC)   Cancer cachexia (HCC)   Prostate cancer metastatic to bone (HCC)   Left wrist fracture, closed, initial encounter   Hypocalcemia   Hyperkalemia   Closed fracture of left distal radius  Closed left ankle fracture and left wrist fracture, closed, consulted [name] of ortho, given patient's metastasized prostate cancer, recommended conservative treatment.  Patient will be nonweightbearing on left lower extremity and follow-up with orthopedic  as an outpatient. Plan: As needed pain control Continue therapy evaluations TOC looking for placement, difficult to place Continue to encourage working with physical therapy    Anemia of chronic disease Hemoglobin low but stable, no obvious bleeding Plan: Transfuse PRBC as needed hemoglobin less than 7    Hyperkalemia.  Resolved. --weekly labs to monitor   Severe protein caloric malnutrition/cancer cachexia. --gained weight, eating well now. --noted that pt has been on decadron 4 mg daily for a long time, presumably for appetite stimulation. Plan: Continue with Ensure Decadron decreased to 2 mg daily as of 9/6 We will plan to discontinue steroids entirely on 9/13 as patient's oral intake has improved    Prostate cancer metastatic to bone (HCC) Consulted [name].   Per [name], Pt was seen in hospital in previous [place].  --cont monthly prostate cancer treatment   Hypocalcemia.   Corrected calcium of 6.9.  Received total of 3 g of calcium gluconate IV. Vitamin D levels more than 200. -Continue with calcium supplement   Anxiety and depression. --cont Remeron --cont Atarax PRN   Insomnia -- Continue trazodone 150 mg nightly   DVT prophylaxis: SQ Lovenox Code Status: DNR Family Communication: None today Disposition Plan: Status is: Inpatient  Remains inpatient appropriate because:Inpatient level of care appropriate due to severity of illness, unsafe discharge plan  Dispo: The patient is from: Home              Anticipated d/c is to:  TBD              Patient currently is medically stable to d/c.   Difficult to  place patient Yes       Level of care: Med-Surg  Consultants:  None  Procedures:  None  Antimicrobials:  None   Subjective: Seen and examined.  No acute distress.  No complaints  Objective: Vitals:   06/19/21 2347 06/20/21 0441 06/20/21 0532 06/20/21 0745  BP: (!) 101/56 105/73 99/62 101/65  Pulse: (!) 102 93 89 96  Resp:  '17 18 17 15  '$ Temp: 98.4 F (36.9 C) 98.4 F (36.9 C) 97.7 F (36.5 C) (!) 97.5 F (36.4 C)  TempSrc:  Oral    SpO2: 96% 97% 98% 98%  Weight:      Height:        Intake/Output Summary (Last 24 hours) at 06/20/2021 1344 Last data filed at 06/20/2021 1017 Gross per 24 hour  Intake 240 ml  Output 501 ml  Net -261 ml   Filed Weights   06/02/21 1634 06/16/21 1000  Weight: 52 kg 59.8 kg    Examination:  General exam: No acute distress.  Appears frail Respiratory system: Decreased at bases.  Lungs clear.  Room air Cardiovascular system: S1-S2, RRR, no murmurs, no pedal edema Gastrointestinal system: Thin, NT/ND, normal bowel sounds Central nervous system: Alert and oriented. No focal neurological deficits. Extremities: Symmetric decreased power bilaterally Skin: No rashes, lesions or ulcers Psychiatry: Judgement and insight appear impaired. Mood & affect flattened.     Data Reviewed: I have personally reviewed following labs and imaging studies  CBC: Recent Labs  Lab 06/15/21 0941 06/18/21 1802  WBC 7.1 6.7  NEUTROABS  --  4.1  HGB 8.5* 8.4*  HCT 28.0* 27.3*  MCV 90.3 91.6  PLT 215 99991111   Basic Metabolic Panel: Recent Labs  Lab 06/15/21 0941 06/18/21 1802  NA 131* 136  K 4.2 5.2*  CL 105 110  CO2 19* 20*  GLUCOSE 133* 124*  BUN 19 15  CREATININE 0.45* 0.56*  CALCIUM 7.6* 7.9*  MG 1.6* 1.8   GFR: Estimated Creatinine Clearance: 84.1 mL/min (A) (by C-G formula based on SCr of 0.56 mg/dL (L)). Liver Function Tests: No results for input(s): AST, ALT, ALKPHOS, BILITOT, PROT, ALBUMIN in the last 168 hours. No results for input(s): LIPASE, AMYLASE in the last 168 hours. No results for input(s): AMMONIA in the last 168 hours. Coagulation Profile: No results for input(s): INR, PROTIME in the last 168 hours. Cardiac Enzymes: No results for input(s): CKTOTAL, CKMB, CKMBINDEX, TROPONINI in the last 168 hours. BNP (last 3 results) No results for input(s): PROBNP  in the last 8760 hours. HbA1C: No results for input(s): HGBA1C in the last 72 hours. CBG: Recent Labs  Lab 06/13/21 1602  GLUCAP 88   Lipid Profile: No results for input(s): CHOL, HDL, LDLCALC, TRIG, CHOLHDL, LDLDIRECT in the last 72 hours. Thyroid Function Tests: No results for input(s): TSH, T4TOTAL, FREET4, T3FREE, THYROIDAB in the last 72 hours. Anemia Panel: No results for input(s): VITAMINB12, FOLATE, FERRITIN, TIBC, IRON, RETICCTPCT in the last 72 hours. Sepsis Labs: No results for input(s): PROCALCITON, LATICACIDVEN in the last 168 hours.  No results found for this or any previous visit (from the past 240 hour(s)).       Radiology Studies: No results found.      Scheduled Meds:  calcium carbonate  1,250 mg Oral TID WC   dexamethasone  2 mg Oral Daily   enoxaparin (LOVENOX) injection  40 mg Subcutaneous Q24H   feeding supplement  237 mL Oral QID   magnesium oxide  400 mg Oral  Daily   mirtazapine  15 mg Oral QHS   multivitamin with minerals  1 tablet Oral Daily   nicotine  21 mg Transdermal Daily   Continuous Infusions:  sodium chloride Stopped (06/04/21 1216)     LOS: 17 days    Time spent: 15 minutes    Sidney Ace, MD [facility] Pager [phone]  If 7PM-7AM, please contact night-coverage  06/20/2021, 1:44 PM

## 2021-06-21 DIAGNOSIS — S82892D Other fracture of left lower leg, subsequent encounter for closed fracture with routine healing: Secondary | ICD-10-CM

## 2021-06-21 NOTE — TOC Progression Note (Signed)
Transition of Care [facility]) - Progression Note    Patient Details  Name: Ryan [name] MRN: [id number] Date of Birth: Jun 13, 1961  Transition of Care [facility]) CM/SW Fl[place], RN Phone Number: 06/21/2021, 4:03 PM  Clinical Narrative:    [facility]ess to [facility], awaiting a bed offer for the patient        Expected Discharge Plan and Services                                                 Social Determinants of Health (SDOH) Interventions    Readmission Risk Interventions Readmission Risk Prevention Plan 04/23/2021  Transportation Screening Complete  PCP or Specialist Appt within 3-5 Days Complete  HRI or [place] Complete  Social Work Consult for [place] Planning/Counseling Not Complete  SW consult not completed comments RNCM assigned to patient  Palliative Care Screening Complete  Medication Review [employer]) Complete  Some recent data might be hidden

## 2021-06-21 NOTE — Progress Notes (Signed)
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  Pt recently here for multiple months with difficult d/c planning, did ultimatley d/c to homeless shelter and was gone only a few hours before fall with L ankle and wrist fractures with return to the ED.    PT Comments    Pt declined all but supine therex this date secondary to fatigue even after encouragement and education on physiological benefits of mobility and WB activities. Pt put forth fair effort with below therex and reported no adverse symptoms during the session.  Pt will benefit from PT services in a SNF setting upon discharge to safely address deficits listed in patient problem list for decreased caregiver assistance and eventual return to PLOF.    Recommendations for follow up therapy are one component of a multi-disciplinary discharge planning process, led by the attending physician.  Recommendations may be updated based on patient status, additional functional criteria and insurance authorization.  Follow Up Recommendations  SNF;Supervision/Assistance - 24 hour     Equipment Recommendations  Other (comment) (TBD at next venue of care)    Recommendations for Other Services       Precautions / Restrictions Precautions Precautions: Fall Precaution Comments: NWB LUE/LLE Splint/Cast: L ankle/LUE Restrictions Weight Bearing Restrictions: Yes LUE Weight Bearing: Non weight bearing LLE Weight Bearing: Non weight bearing     Mobility  Bed Mobility               General bed mobility comments: Pt declined to all but supine therex this date secondary to fatigue despite encouragement and education of  benefits of mobility and standing/WB activities    Transfers                    Ambulation/Gait                 Stairs             Wheelchair Mobility    Modified Rankin (Stroke Patients Only)       Balance                                            Cognition Arousal/Alertness: Awake/alert Behavior During Therapy: WFL for tasks assessed/performed Overall Cognitive Status: Within Functional Limits for tasks assessed                                        Exercises Total Joint Exercises Ankle Circles/Pumps: AROM;Strengthening;Right;10 reps (with manual resistance) Quad Sets: Strengthening;Both;10 reps Short Arc Quad: Strengthening;Both;10 reps Heel Slides: Strengthening;Both;10 reps (LLE supported to maintain NWB status) Hip ABduction/ADduction: AROM;AAROM;Strengthening;Both;10 reps Straight Leg Raises: AROM;AAROM;Strengthening;Both;10 reps Other Exercises Other Exercises: Pt education provided on physiological benefits of activity    General Comments        Pertinent Vitals/Pain Pain Assessment: No/denies pain    Home Living                      Prior Function  PT Goals (current goals can now be found in the care plan section) Progress towards PT goals: PT to reassess next treatment    Frequency    Min 2X/week      PT Plan Current plan remains appropriate    Co-evaluation              AM-PAC PT "6 Clicks" Mobility   Outcome Measure  Help needed turning from your back to your side while in a flat bed without using bedrails?: A Little Help needed moving from lying on your back to sitting on the side of a flat bed without using bedrails?: A Little Help needed moving to and from a bed to a chair (including a wheelchair)?: A Lot Help needed standing up from a chair using your arms (e.g., wheelchair or bedside chair)?: A Lot Help needed to walk in hospital room?:  Total Help needed climbing 3-5 steps with a railing? : Total 6 Click Score: 12    End of Session   Activity Tolerance: Patient tolerated treatment well Patient left: in bed;with call bell/phone within reach;with bed alarm set Nurse Communication: Mobility status PT Visit Diagnosis: Unsteadiness on feet (R26.81);Muscle weakness (generalized) (M62.81);Difficulty in walking, not elsewhere classified (R26.2);History of falling (Z91.81)     Time: [id number] PT Time Calculation (min) (ACUTE ONLY): 14 min  Charges:  $Therapeutic Exercise: 8-22 mins                    D. Royetta Asal PT, DPT 06/21/21, 2:47 PM

## 2021-06-21 NOTE — Progress Notes (Signed)
OT Cancellation Note  Patient Details Name: Ryan [name] MRN: [id number] DOB: 1961-08-14   Cancelled Treatment:    Reason Eval/Treat Not Completed: Fatigue/lethargy limiting ability to participate. [name] was sleeping when therapist entered room. He woke following loud calling of his name, but stated that he was too tired to participate in therapy at the moment (in addition, full lunch tray was present in room, but pt also declined eating at this time). Will attempt therapy at a later time/date, as pt is available and willing.  Josiah Lobo, PhD, MS, OTR/L 06/21/21, 2:10 PM

## 2021-06-21 NOTE — Progress Notes (Signed)
PROGRESS NOTE    Ryan [name]  [id number] DOB: 1961-04-22 DOA: 06/02/2021 PCP: Pcp, No   Brief Narrative:  60 y.o. 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   Patient was recently hospitalized from 6/21-8/24 due to metastasized prostate cancer. Pt was seen by [name] of oncology. He got his last prostate cancer treatment yesterday. He was just discharged from the hospital yesterday. He was supposed to go to a homeless shelter but apparently there were issues when he was transported there. He sates that he he felt dizzy and fell accidentally. No LOC. He injured his left wrist and left ankle, causing severe pain in left ankle and left wrist.     Left wrist x-ray with fracture of distal radius and ulna.   CT left ankle with impacted distal tibial and fibular fractures, mild posterior displacement and angulation.  Orthopedic was consulted and they are recommending conservative management based on his other comorbidities and advanced malignancy.  Patient will be nonweightbearing her left lower extremity.  Therapy is recommended skilled nursing facility.  Medicaid application is in progress.  TOC working on placement.  As of 9/7 no bed offers.   Assessment & Plan:   Principal Problem:   Closed left ankle fracture Active Problems:   Fall   Anemia of chronic disease   Protein-calorie malnutrition, severe (HCC)   Cancer cachexia (HCC)   Prostate cancer metastatic to bone (HCC)   Left wrist fracture, closed, initial encounter   Hypocalcemia   Hyperkalemia   Closed fracture of left distal radius  Closed left ankle fracture and left wrist fracture, closed, consulted [name] of ortho, given patient's metastasized prostate cancer, recommended conservative treatment.  Patient will be nonweightbearing on left lower extremity and follow-up with orthopedic  as an outpatient. Plan: As needed pain control Continue therapy evaluations TOC looking for placement, difficult to place Continue to encourage working with physical therapy    Anemia of chronic disease Hemoglobin low but stable, no obvious bleeding Plan: Transfuse PRBC as needed hemoglobin less than 7    Hyperkalemia.  Resolved. --weekly labs to monitor   Severe protein caloric malnutrition/cancer cachexia. --gained weight, eating well now. --noted that pt has been on decadron 4 mg daily for a long time, presumably for appetite stimulation. Plan: Continue with Ensure Appetite improved Nutrition follow-up Decadron decreased to 2 mg daily as of 9/6 We will plan to discontinue steroids entirely on 9/13 as patient's oral intake has improved    Prostate cancer metastatic to bone (HCC) Consulted [name].   Per [name], Pt was seen in hospital in previous [place].  --cont monthly prostate cancer treatment   Hypocalcemia.   Corrected calcium of 6.9.  Received total of 3 g of calcium gluconate IV. Vitamin D levels more than 200. -Continue with calcium supplement   Anxiety and depression. --cont Remeron --cont Atarax PRN   Insomnia -- Continue trazodone 150 mg nightly   DVT prophylaxis: SQ Lovenox Code Status: DNR Family Communication: None today Disposition Plan: Status is: Inpatient  Remains inpatient appropriate because:Inpatient level of care appropriate due to severity of illness, unsafe discharge plan  Dispo: The patient is from: Home              Anticipated d/c is to:  TBD              Patient currently is medically stable to d/c.  Difficult to place patient Yes       Level of care: Med-Surg  Consultants:  None  Procedures:  None  Antimicrobials:  None   Subjective: Seen and examined.  No acute distress.  No complaints  Objective: Vitals:   06/20/21 0745 06/20/21 1553 06/20/21 2016 06/21/21 0803  BP: 101/65 (!) 104/56 103/66  (!) 94/59  Pulse: 96 97 96 98  Resp: '15 16 17 16  '$ Temp: (!) 97.5 F (36.4 C) 97.6 F (36.4 C) 97.9 F (36.6 C) 98 F (36.7 C)  TempSrc:  Oral    SpO2: 98% 98% 98% 95%  Weight:      Height:        Intake/Output Summary (Last 24 hours) at 06/21/2021 1047 Last data filed at 06/21/2021 0635 Gross per 24 hour  Intake --  Output 550 ml  Net -550 ml   Filed Weights   06/02/21 1634 06/16/21 1000  Weight: 52 kg 59.8 kg    Examination:  General exam: No acute distress.  Appears frail Respiratory system: Decreased at bases.  Lungs clear.  Room air Cardiovascular system: S1-S2, RRR, no murmurs, no pedal edema Gastrointestinal system: Thin, NT/ND, normal bowel sounds Central nervous system: Alert and oriented. No focal neurological deficits. Extremities: Symmetric decreased power bilaterally Skin: No rashes, lesions or ulcers Psychiatry: Judgement and insight appear impaired. Mood & affect flattened.     Data Reviewed: I have personally reviewed following labs and imaging studies  CBC: Recent Labs  Lab 06/15/21 0941 06/18/21 1802  WBC 7.1 6.7  NEUTROABS  --  4.1  HGB 8.5* 8.4*  HCT 28.0* 27.3*  MCV 90.3 91.6  PLT 215 99991111   Basic Metabolic Panel: Recent Labs  Lab 06/15/21 0941 06/18/21 1802  NA 131* 136  K 4.2 5.2*  CL 105 110  CO2 19* 20*  GLUCOSE 133* 124*  BUN 19 15  CREATININE 0.45* 0.56*  CALCIUM 7.6* 7.9*  MG 1.6* 1.8   GFR: Estimated Creatinine Clearance: 84.1 mL/min (A) (by C-G formula based on SCr of 0.56 mg/dL (L)). Liver Function Tests: No results for input(s): AST, ALT, ALKPHOS, BILITOT, PROT, ALBUMIN in the last 168 hours. No results for input(s): LIPASE, AMYLASE in the last 168 hours. No results for input(s): AMMONIA in the last 168 hours. Coagulation Profile: No results for input(s): INR, PROTIME in the last 168 hours. Cardiac Enzymes: No results for input(s): CKTOTAL, CKMB, CKMBINDEX, TROPONINI in the last 168 hours. BNP (last 3  results) No results for input(s): PROBNP in the last 8760 hours. HbA1C: No results for input(s): HGBA1C in the last 72 hours. CBG: No results for input(s): GLUCAP in the last 168 hours.  Lipid Profile: No results for input(s): CHOL, HDL, LDLCALC, TRIG, CHOLHDL, LDLDIRECT in the last 72 hours. Thyroid Function Tests: No results for input(s): TSH, T4TOTAL, FREET4, T3FREE, THYROIDAB in the last 72 hours. Anemia Panel: No results for input(s): VITAMINB12, FOLATE, FERRITIN, TIBC, IRON, RETICCTPCT in the last 72 hours. Sepsis Labs: No results for input(s): PROCALCITON, LATICACIDVEN in the last 168 hours.  No results found for this or any previous visit (from the past 240 hour(s)).       Radiology Studies: No results found.      Scheduled Meds:  calcium carbonate  1,250 mg Oral TID WC   dexamethasone  2 mg Oral Daily   enoxaparin (LOVENOX) injection  40 mg Subcutaneous Q24H   feeding supplement  237 mL Oral QID   magnesium oxide  400 mg  Oral Daily   mirtazapine  15 mg Oral QHS   multivitamin with minerals  1 tablet Oral Daily   nicotine  21 mg Transdermal Daily   Continuous Infusions:  sodium chloride Stopped (06/04/21 1216)     LOS: 18 days    Time spent: 15 minutes    Sidney Ace, MD [facility] Pager [phone]  If 7PM-7AM, please contact night-coverage  06/21/2021, 10:47 AM

## 2021-06-22 NOTE — Progress Notes (Signed)
OT Cancellation Note  Patient Details Name: Ryan [name] MRN: [id number] DOB: 03/10/1961   Cancelled Treatment:    Reason Eval/Treat Not Completed: Patient declined, no reason specified;Fatigue/lethargy limiting ability to participate. Pt reclined in bed eyes closed, wakes to voice. Lunch tray at bedside untouched, pt states "later" when encouraged to participate in therapy. Red theraband left in room for HEP education next session.   Dessie Coma, M.S. OTR/L  06/22/21, 1:47 PM  ascom [phone]

## 2021-06-22 NOTE — Progress Notes (Signed)
PT Cancellation Note  Patient Details Name: Rylyn Dyas MRN: [id number] DOB: 22-Aug-1961   Cancelled Treatment:    Reason Eval/Treat Not Completed: Patient declined, no reason specified. Patient awake, laying in bed eating crackers. Patient politely declined PT at this time. PT will continue with attempts.   Minna Merritts, PT, MPT  [name] 06/22/2021, 3:35 PM

## 2021-06-22 NOTE — Progress Notes (Signed)
PROGRESS NOTE    Ryan [name]  [id number] DOB: 10-27-1960 DOA: 06/02/2021 Ryan [name]: Ryan [name], No   Brief Narrative:  60 y.o. 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   Patient was recently hospitalized from 6/21-8/24 due to metastasized prostate cancer. Pt was seen by Ryan [name] of oncology. He got his last prostate cancer treatment yesterday. He was just discharged from the hospital yesterday. He was supposed to go to a homeless shelter but apparently there were issues when he was transported there. He sates that he he felt dizzy and fell accidentally. No LOC. He injured his left wrist and left ankle, causing severe pain in left ankle and left wrist.     Left wrist x-ray with fracture of distal radius and ulna.   CT left ankle with impacted distal tibial and fibular fractures, mild posterior displacement and angulation.  Orthopedic was consulted and they are recommending conservative management based on his other comorbidities and advanced malignancy.  Patient will be nonweightbearing her left lower extremity.  Therapy is recommended skilled nursing facility.  Medicaid application is in progress.  TOC working on placement.  As of 9/13 no bed offers   Assessment & Plan:   Principal Problem:   Closed left ankle fracture Active Problems:   Fall   Anemia of chronic disease   Protein-calorie malnutrition, severe (HCC)   Cancer cachexia (HCC)   Prostate cancer metastatic to bone (HCC)   Left wrist fracture, closed, initial encounter   Hypocalcemia   Hyperkalemia   Closed fracture of left distal radius  Closed left ankle fracture and left wrist fracture, closed, consulted [name] of ortho, given patient's metastasized prostate cancer, recommended conservative treatment.  Patient will be nonweightbearing on left lower extremity and follow-up with orthopedic  as an outpatient. Plan: As needed pain control Continue therapy evaluations TOC looking for placement, difficult to place [facility] faxed to [facility] rehab center.  Awaiting bed offer    Anemia of chronic disease Hemoglobin low but stable, no obvious bleeding Plan: Transfuse PRBC as needed hemoglobin less than 7    Hyperkalemia.  Resolved. --weekly labs to monitor   Severe protein caloric malnutrition/cancer cachexia. --gained weight, eating well now. --noted that pt has been on decadron 4 mg daily for a long time, presumably for appetite stimulation. Plan: Continue with Ensure Appetite improved Nutrition follow-up Decadron decreased to 2 mg daily as of 9/6 Discontinue steroids on 9/13    Prostate cancer metastatic to bone [facility]) Consulted Ryan [name].   Per Ryan [name], Pt was seen in hospital in previous [place].  --cont monthly prostate cancer treatment   Hypocalcemia.   Corrected calcium of 6.9.  Received total of 3 g of calcium gluconate IV. Vitamin D levels more than 200. -Continue with calcium supplement   Anxiety and depression. --cont Remeron --cont Atarax PRN   Insomnia -- Continue trazodone 150 mg nightly   DVT prophylaxis: SQ Lovenox Code Status: DNR Family Communication: None today Disposition Plan: Status is: Inpatient  Remains inpatient appropriate because:Inpatient level of care appropriate due to severity of illness, unsafe discharge plan  Dispo: The patient is from: Home              Anticipated d/c is to:  TBD              Patient currently is medically stable to d/c.   Difficult to place patient Yes  Level of care: Med-Surg  Consultants:  None  Procedures:  None  Antimicrobials:  None   Subjective: Seen and examined.  No acute distress.  No complaints  Objective: Vitals:   06/21/21 1638 06/21/21 2019 06/22/21 0315 06/22/21 0839  BP: (!) 96/54 113/60 102/62 (!) 104/54  Pulse: 100 (!) 103 91 96  Resp: '16 17 17 20   '$ Temp: 98.1 F (36.7 C) 97.8 F (36.6 C) 97.8 F (36.6 C) 98.3 F (36.8 C)  TempSrc:      SpO2: 96% 95% 95% 96%  Weight:      Height:        Intake/Output Summary (Last 24 hours) at 06/22/2021 1520 Last data filed at 06/22/2021 1012 Gross per 24 hour  Intake 240 ml  Output 100 ml  Net 140 ml   Filed Weights   06/02/21 1634 06/16/21 1000  Weight: 52 kg 59.8 kg    Examination:  General exam: No acute distress.  Appears frail Respiratory system: Decreased at bases.  Lungs clear.  Room air Cardiovascular system: S1-S2, RRR, no murmurs, no pedal edema Gastrointestinal system: Thin, NT/ND, normal bowel sounds Central nervous system: Alert and oriented. No focal neurological deficits. Extremities: Symmetric decreased power bilaterally Skin: No rashes, lesions or ulcers Psychiatry: Judgement and insight appear impaired. Mood & affect flattened.     Data Reviewed: I have personally reviewed following labs and imaging studies  CBC: Recent Labs  Lab 06/18/21 1802  WBC 6.7  NEUTROABS 4.1  HGB 8.4*  HCT 27.3*  MCV 91.6  PLT 99991111   Basic Metabolic Panel: Recent Labs  Lab 06/18/21 1802  NA 136  K 5.2*  CL 110  CO2 20*  GLUCOSE 124*  BUN 15  CREATININE 0.56*  CALCIUM 7.9*  MG 1.8   GFR: Estimated Creatinine Clearance: 84.1 mL/min (A) (by C-G formula based on SCr of 0.56 mg/dL (L)). Liver Function Tests: No results for input(s): AST, ALT, ALKPHOS, BILITOT, PROT, ALBUMIN in the last 168 hours. No results for input(s): LIPASE, AMYLASE in the last 168 hours. No results for input(s): AMMONIA in the last 168 hours. Coagulation Profile: No results for input(s): INR, PROTIME in the last 168 hours. Cardiac Enzymes: No results for input(s): CKTOTAL, CKMB, CKMBINDEX, TROPONINI in the last 168 hours. BNP (last 3 results) No results for input(s): PROBNP in the last 8760 hours. HbA1C: No results for input(s): HGBA1C in the last 72 hours. CBG: No results for input(s):  GLUCAP in the last 168 hours.  Lipid Profile: No results for input(s): CHOL, HDL, LDLCALC, TRIG, CHOLHDL, LDLDIRECT in the last 72 hours. Thyroid Function Tests: No results for input(s): TSH, T4TOTAL, FREET4, T3FREE, THYROIDAB in the last 72 hours. Anemia Panel: No results for input(s): VITAMINB12, FOLATE, FERRITIN, TIBC, IRON, RETICCTPCT in the last 72 hours. Sepsis Labs: No results for input(s): PROCALCITON, LATICACIDVEN in the last 168 hours.  No results found for this or any previous visit (from the past 240 hour(s)).       Radiology Studies: No results found.      Scheduled Meds:  calcium carbonate  1,250 mg Oral TID WC   enoxaparin (LOVENOX) injection  40 mg Subcutaneous Q24H   feeding supplement  237 mL Oral QID   magnesium oxide  400 mg Oral Daily   mirtazapine  15 mg Oral QHS   multivitamin with minerals  1 tablet Oral Daily   nicotine  21 mg Transdermal Daily   Continuous Infusions:  sodium chloride Stopped (06/04/21 1216)  LOS: 19 days    Time spent: 15 minutes    Sidney Ace, MD [facility] Pager [phone]  If 7PM-7AM, please contact night-coverage  06/22/2021, 3:20 PM

## 2021-06-23 NOTE — Progress Notes (Signed)
Nutrition Follow-up  DOCUMENTATION CODES:  Severe malnutrition in context of chronic illness, Underweight  INTERVENTION:  Continue current diet as ordered Continue Ensure Enlive po QID, each supplement provides 350 kcal and 20 grams of protein Continue MVI with minerals daily Continue snacks TID Request weekly weights  NUTRITION DIAGNOSIS:  Severe Malnutrition related to chronic illness, cancer and cancer related treatments as evidenced by severe fat depletion, severe muscle depletion.  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 Recently admitted 6/21-8/24. Discharged to homeless shelter then returned to ED a few hours later after a fall and pain to his wrist.  Imaging in the emergency department showed a distal radius fracture and a left distal tibia and fibula fracture. Pt declined surgical interventions at this time.  Stable intake this admission, declined from last week but limited meals recorded. Pt continues to consume ensure at each administration. No new weight since last week, will request. Stable for dc, case management team working on placement.   Average Meal Intake: 8/27-8/29: 72% intake x 2 recorded meals (50-94%) 8/30-9/7: 63% intake x 2 recorded meals (25-100%) 9/8-9/14: 50% intake x 3 recorded meals (50%)  Nutritionally Relevant Medications: Scheduled Meds:  calcium carbonate  1,250 mg Oral TID WC   feeding supplement  237 mL Oral QID   magnesium oxide  400 mg Oral Daily   mirtazapine  15 mg Oral QHS   multivitamin with minerals  1 tablet Oral Daily   PRN Meds: ondansetron  Labs Reviewed 9/9: K 5.2 Creatinine .70  NUTRITION - FOCUSED PHYSICAL EXAM: Flowsheet Row Most  Recent Value  Orbital Region Mild depletion  Upper Arm Region Severe depletion  Thoracic and Lumbar Region Severe depletion  Buccal Region Mild depletion  Temple Region Moderate depletion  Clavicle Bone Region Severe depletion  Clavicle and Acromion Bone Region Severe depletion  Scapular Bone Region Severe depletion  Dorsal Hand Severe depletion  Patellar Region Severe depletion  Anterior Thigh Region Severe depletion  Posterior Calf Region Severe depletion  Edema (RD Assessment) None  Hair Reviewed  Eyes Reviewed  Mouth Reviewed  Skin Reviewed  Nails Reviewed    Diet Order:   Diet Order             Diet regular Room service appropriate? Yes; Fluid consistency: Thin  Diet effective now                  EDUCATION NEEDS:  Education needs have been addressed  Skin:  Skin Assessment: Reviewed RN Assessment  Last BM:  9/13 - type 3  Height:  Ht Readings from Last 1 Encounters:  06/02/21 '6\' 5"'$  (1.956 m)   Weight:  Wt Readings from Last 1 Encounters:  06/16/21 59.8 kg   Ideal Body Weight:  94.5 kg  BMI:  Body mass index is 15.63 kg/m.  Estimated Nutritional Needs:  Kcal:  2400-2600 Protein:  130-150 grams Fluid:  >2.4 L  Ranell Patrick, RD, LDN Clinical Dietitian Pager on [place]

## 2021-06-23 NOTE — Progress Notes (Signed)
PT Cancellation Note  Patient Details Name: Ryan [name] MRN: [id number] DOB: [birth year]   Cancelled Treatment:     Pt declined PT after several attempts today due to fatigue   Josie Dixon 06/23/2021, 3:18 PM

## 2021-06-23 NOTE — Progress Notes (Signed)
PROGRESS NOTE    Ryan [name]  [id number] DOB: August 25, 1959 DOA: 06/02/2021 PCP: Pcp, No   Brief Narrative:  60 y.o. 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   Patient was recently hospitalized from 6/21-8/24 due to metastasized prostate cancer. Pt was seen by [name] of oncology. He got his last prostate cancer treatment yesterday. He was just discharged from the hospital yesterday. He was supposed to go to a homeless shelter but apparently there were issues when he was transported there. He sates that he he felt dizzy and fell accidentally. No LOC. He injured his left wrist and left ankle, causing severe pain in left ankle and left wrist.     Left wrist x-ray with fracture of distal radius and ulna.   CT left ankle with impacted distal tibial and fibular fractures, mild posterior displacement and angulation.  Orthopedic was consulted and they are recommending conservative management based on his other comorbidities and advanced malignancy.  Patient will be nonweightbearing her left lower extremity.  Therapy is recommended skilled nursing facility.  Medicaid application is in progress.  TOC working on placement.  As of 9/13 no bed offers   Assessment & Plan:   Principal Problem:   Closed left ankle fracture Active Problems:   Fall   Anemia of chronic disease   Protein-calorie malnutrition, severe (HCC)   Cancer cachexia (HCC)   Prostate cancer metastatic to bone (HCC)   Left wrist fracture, closed, initial encounter   Hypocalcemia   Hyperkalemia   Closed fracture of left distal radius  Closed left ankle fracture and left wrist fracture, closed, consulted [name] of ortho, given patient's metastasized prostate cancer, recommended conservative treatment.  Patient will be nonweightbearing on left lower extremity and follow-up with orthopedic  as an outpatient. Plan: As needed pain control Continue therapy evaluations TOC looking for placement, difficult to place [facility] faxed to [facility] rehab center.  Awaiting bed offer    Anemia of chronic disease Hemoglobin low but stable, no obvious bleeding Plan: Transfuse PRBC as needed hemoglobin less than 7    Hyperkalemia.  Resolved. --weekly labs to monitor   Severe protein caloric malnutrition/cancer cachexia. --gained weight, eating well now. --noted that pt has been on decadron 4 mg daily for a long time, presumably for appetite stimulation. Plan: Continue with Ensure Appetite improved Nutrition follow-up Decadron decreased to 2 mg daily as of 9/6 Discontinue steroids on 9/13    Prostate cancer metastatic to bone [facility]) Consulted [name].   Per [name], Pt was seen in hospital in previous [place].  --cont monthly prostate cancer treatment   Hypocalcemia.   Corrected calcium of 6.9.  Received total of 3 g of calcium gluconate IV. Vitamin D levels more than 200. -Continue with calcium supplement   Anxiety and depression. --cont Remeron --cont Atarax PRN   Insomnia -- Continue trazodone 150 mg nightly   DVT prophylaxis: SQ Lovenox Code Status: DNR Family Communication: None today Disposition Plan: Status is: Inpatient  Remains inpatient appropriate because:Inpatient level of care appropriate due to severity of illness, unsafe discharge plan  Dispo: The patient is from: Home              Anticipated d/c is to:  TBD              Patient currently is medically stable to d/c.   Difficult to place patient Yes  Level of care: Med-Surg  Consultants:  None  Procedures:  None  Antimicrobials:  None   Subjective: No new complaints  Objective: Vitals:   06/23/21 0434 06/23/21 0739 06/23/21 1545 06/23/21 1950  BP: 92/64 (!) 100/59 106/66 (!) 108/58  Pulse: 95 93 98 100  Resp: '19 16 15 16  '$ Temp: 98.5 F (36.9 C) 98.8 F (37.1 C)  98 F (36.7 C) 98 F (36.7 C)  TempSrc:   Oral Oral  SpO2: 97% 96% 95% 96%  Weight:   62.6 kg   Height:        Intake/Output Summary (Last 24 hours) at 06/23/2021 2219 Last data filed at 06/23/2021 0200 Gross per 24 hour  Intake 480 ml  Output 100 ml  Net 380 ml   Filed Weights   06/02/21 1634 06/16/21 1000 06/23/21 1545  Weight: 52 kg 59.8 kg 62.6 kg    Examination:  General exam: No acute distress.  Appears frail Respiratory system: Decreased at bases.  Lungs clear.  Room air Cardiovascular system: S1-S2, RRR, no murmurs, no pedal edema Gastrointestinal system: Thin, NT/ND, normal bowel sounds Central nervous system: Alert and oriented. No focal neurological deficits. Extremities: Symmetric decreased power bilaterally Skin: No rashes, lesions or ulcers Psychiatry: Judgement and insight appear impaired. Mood & affect flattened.     Data Reviewed: I have personally reviewed following labs and imaging studies  CBC: Recent Labs  Lab 06/18/21 1802  WBC 6.7  NEUTROABS 4.1  HGB 8.4*  HCT 27.3*  MCV 91.6  PLT 99991111   Basic Metabolic Panel: Recent Labs  Lab 06/18/21 1802  NA 136  K 5.2*  CL 110  CO2 20*  GLUCOSE 124*  BUN 15  CREATININE 0.56*  CALCIUM 7.9*  MG 1.8   GFR: Estimated Creatinine Clearance: 88 mL/min (A) (by C-G formula based on SCr of 0.56 mg/dL (L)). Liver Function Tests: No results for input(s): AST, ALT, ALKPHOS, BILITOT, PROT, ALBUMIN in the last 168 hours. No results for input(s): LIPASE, AMYLASE in the last 168 hours. No results for input(s): AMMONIA in the last 168 hours. Coagulation Profile: No results for input(s): INR, PROTIME in the last 168 hours. Cardiac Enzymes: No results for input(s): CKTOTAL, CKMB, CKMBINDEX, TROPONINI in the last 168 hours. BNP (last 3 results) No results for input(s): PROBNP in the last 8760 hours. HbA1C: No results for input(s): HGBA1C in the last 72 hours. CBG: No results for input(s): GLUCAP in the  last 168 hours.  Lipid Profile: No results for input(s): CHOL, HDL, LDLCALC, TRIG, CHOLHDL, LDLDIRECT in the last 72 hours. Thyroid Function Tests: No results for input(s): TSH, T4TOTAL, FREET4, T3FREE, THYROIDAB in the last 72 hours. Anemia Panel: No results for input(s): VITAMINB12, FOLATE, FERRITIN, TIBC, IRON, RETICCTPCT in the last 72 hours. Sepsis Labs: No results for input(s): PROCALCITON, LATICACIDVEN in the last 168 hours.  No results found for this or any previous visit (from the past 240 hour(s)).       Radiology Studies: No results found.      Scheduled Meds:  calcium carbonate  1,250 mg Oral TID WC   enoxaparin (LOVENOX) injection  40 mg Subcutaneous Q24H   feeding supplement  237 mL Oral QID   magnesium oxide  400 mg Oral Daily   mirtazapine  15 mg Oral QHS   multivitamin with minerals  1 tablet Oral Daily   nicotine  21 mg Transdermal Daily   Continuous Infusions:  sodium chloride Stopped (06/04/21 1216)  LOS: 20 days    Time spent: 15 minutes    Kathie Dike, MD [facility] Pager [phone]  If 7PM-7AM, please contact night-coverage  06/23/2021, 10:19 PM

## 2021-06-24 NOTE — Progress Notes (Signed)
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  Pt recently here for multiple months with difficult d/c planning, did ultimatley d/c to homeless shelter and was gone only a few hours before fall with L ankle and wrist fractures with return to the ED.    PT Comments    Pt was long sitting in bed upon arriving. With encouragement, he agrees to session and OOB activity. Was able to dress himself with pants/shrub top without assistance. Stood one time from EOB and 1 x from w/c to platform RW. Max assist from lower w/c height however mod assist from elevated bed height. Constant Vcs for NWB. Pt struggles to adhere but did static stand x 2 minutes with close supervision. At conclusion of session, pt was sitting in recliner with lunch tray placed in front of him and call bell in reach. Acute PT will continue to follow per current POC.    Recommendations for follow up therapy are one component of a multi-disciplinary discharge planning process, led by the attending physician.  Recommendations may be updated based on patient status, additional functional criteria and insurance authorization.  Follow Up Recommendations  SNF;Supervision/Assistance - 24 hour     Equipment Recommendations  Other (comment)       Precautions / Restrictions Precautions Precautions: Fall Precaution Comments: NWB LUE/LLE Required Braces or Orthoses: Splint/Cast Spinal Brace Comments: pt no longer using TLSO brace Splint/Cast: L ankle/LUE Restrictions Weight Bearing Restrictions: Yes LUE Weight Bearing: Non weight bearing LLE Weight Bearing: Non weight bearing     Mobility  Bed Mobility Overal  bed mobility: Needs Assistance Bed Mobility: Supine to Sit     Supine to sit: Supervision     General bed mobility comments: Pt was able to progress from supine to short sit with increased time however no physical assistance required.    Transfers Overall transfer level: Needs assistance Equipment used: Left platform walker;Rolling walker (2 wheeled) Transfers: Sit to/from Stand Sit to Stand: Mod assist;Max assist;From elevated surface         General transfer comment: mod assist from elevated bed height. Max assist from lower wc surface  Ambulation/Gait      General Gait Details: unable due to NWB    Balance Overall balance assessment: Needs assistance Sitting-balance support: Feet supported;No upper extremity supported Sitting balance-Leahy Scale: Good     Standing balance support: During functional activity;Bilateral upper extremity supported Standing balance-Leahy Scale: Poor Standing balance comment: pt was able to tolerate standing x 2 minutes however does fatigue and c/o severe bilateral foot pain in static standing. did static stand x 2 minutes        Cognition Arousal/Alertness: Awake/alert Behavior During Therapy: WFL for tasks assessed/performed Overall Cognitive Status: Within Functional Limits for tasks assessed        General Comments: Pt is A and O x 4. Knows he is non wt bearing however gives poor effort to adhere to it             Pertinent Vitals/Pain Pain Assessment: 0-10 Pain Score: 6  Faces Pain Scale: Hurts even more Pain Location:  (bilateral feet) Pain Descriptors / Indicators: Numbness (pins and needles) Pain Intervention(s): Limited activity within patient's tolerance;Monitored  during session;Premedicated before session;Repositioned     PT Goals (current goals can now be found in the care plan section) Acute Rehab PT Goals Patient Stated Goal: to get stronger Progress towards PT goals: Progressing toward goals     Frequency    Min 2X/week      PT Plan Current plan remains appropriate    Co-evaluation     PT goals addressed during session: Mobility/safety with mobility;Balance;Proper use of DME;Strengthening/ROM        AM-PAC PT "6 Clicks" Mobility   Outcome Measure  Help needed turning from your back to your side while in a flat bed without using bedrails?: A Little Help needed moving from lying on your back to sitting on the side of a flat bed without using bedrails?: A Little Help needed moving to and from a bed to a chair (including a wheelchair)?: A Lot Help needed standing up from a chair using your arms (e.g., wheelchair or bedside chair)?: A Lot Help needed to walk in hospital room?: Total Help needed climbing 3-5 steps with a railing? : Total 6 Click Score: 12    End of Session Equipment Utilized During Treatment: Gait belt Activity Tolerance: Patient tolerated treatment well Patient left: in chair;with call bell/phone within reach;with chair alarm set Nurse Communication: Mobility status PT Visit Diagnosis: Unsteadiness on feet (R26.81);Muscle weakness (generalized) (M62.81);Difficulty in walking, not elsewhere classified (R26.2);History of falling (Z91.81) Pain - Right/Left: Left Pain - part of body: Ankle and joints of foot     Time: [id number] PT Time Calculation (min) (ACUTE ONLY): 15 min  Charges:  $Therapeutic Activity: 8-22 mins                     Julaine Fusi PTA 06/24/21, 1:38 PM

## 2021-06-24 NOTE — Progress Notes (Signed)
PROGRESS NOTE    Ryan [name]  [id number] DOB: 1960-11-16 DOA: 06/02/2021 PCP: Pcp, No   Brief Narrative:  60 y.o. 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   Patient was recently hospitalized from 6/21-8/24 due to metastasized prostate cancer. Pt was seen by [name] of oncology. He got his last prostate cancer treatment yesterday. He was just discharged from the hospital yesterday. He was supposed to go to a homeless shelter but apparently there were issues when he was transported there. He sates that he he felt dizzy and fell accidentally. No LOC. He injured his left wrist and left ankle, causing severe pain in left ankle and left wrist.     Left wrist x-ray with fracture of distal radius and ulna.   CT left ankle with impacted distal tibial and fibular fractures, mild posterior displacement and angulation.  Orthopedic was consulted and they are recommending conservative management based on his other comorbidities and advanced malignancy.  Patient will be nonweightbearing her left lower extremity.  Therapy is recommended skilled nursing facility.  Medicaid application is in progress.  TOC working on placement.  As of 9/13 no bed offers   Assessment & Plan:   Principal Problem:   Closed left ankle fracture Active Problems:   Fall   Anemia of chronic disease   Protein-calorie malnutrition, severe (HCC)   Cancer cachexia (HCC)   Prostate cancer metastatic to bone (HCC)   Left wrist fracture, closed, initial encounter   Hypocalcemia   Hyperkalemia   Closed fracture of left distal radius  Closed left ankle fracture and left wrist fracture, closed, consulted [name] of ortho, given patient's metastasized prostate cancer, recommended conservative treatment.  Patient will be nonweightbearing on left lower extremity and follow-up with orthopedic  as an outpatient. Plan: As needed pain control Continue therapy evaluations TOC looking for placement, difficult to place [facility] faxed to [facility] rehab center.  Awaiting bed offer    Anemia of chronic disease Hemoglobin low but stable, no obvious bleeding Plan: Transfuse PRBC as needed hemoglobin less than 7    Hyperkalemia.  Resolved. --weekly labs to monitor   Severe protein caloric malnutrition/cancer cachexia. --gained weight, eating well now. --noted that pt has been on decadron 4 mg daily for a long time, presumably for appetite stimulation. Plan: Continue with Ensure Appetite improved Nutrition follow-up Decadron decreased to 2 mg daily as of 9/6 Discontinue steroids on 9/13    Prostate cancer metastatic to bone [facility]) Consulted [name].   Per [name], Pt was seen in hospital in previous [place].  --cont monthly prostate cancer treatment   Hypocalcemia.   Corrected calcium of 6.9.  Received total of 3 g of calcium gluconate IV. Vitamin D levels more than 200. -Continue with calcium supplement   Anxiety and depression. --cont Remeron --cont Atarax PRN   Insomnia -- Continue trazodone 150 mg nightly   DVT prophylaxis: SQ Lovenox Code Status: DNR Family Communication: None today Disposition Plan: Status is: Inpatient  Remains inpatient appropriate because:Inpatient level of care appropriate due to severity of illness, unsafe discharge plan  Dispo: The patient is from: Home              Anticipated d/c is to:  TBD              Patient currently is medically stable to d/c.   Difficult to place patient Yes  Level of care: Med-Surg  Consultants:  None  Procedures:  None  Antimicrobials:  None   Subjective: No pain, shortness of breath.  Worked with physical therapy today, denies any dizziness on standing  Objective: Vitals:   06/23/21 1950 06/24/21 0651 06/24/21 0746 06/24/21 1600  BP: (!) 108/58 (!) '98/57 96/64 94/61 '$   Pulse:  98 100 98  Resp: '16 18 16 18  '$ Temp: 98 F (36.7 C) 97.7 F (36.5 C) 98.3 F (36.8 C) 98.7 F (37.1 C)  TempSrc: Oral Oral Oral Oral  SpO2: 96% 96% 95% 96%  Weight:      Height:       No intake or output data in the 24 hours ending 06/24/21 1930  Filed Weights   06/02/21 1634 06/16/21 1000 06/23/21 1545  Weight: 52 kg 59.8 kg 62.6 kg    Examination:  General exam: Alert, awake, oriented x 3 Respiratory system: Clear to auscultation. Respiratory effort normal. Cardiovascular system:RRR. No murmurs, rubs, gallops. Gastrointestinal system: Abdomen is nondistended, soft and nontender. No organomegaly or masses felt. Normal bowel sounds heard. Central nervous system: Alert and oriented. No focal neurological deficits. Extremities: No C/C/E, +pedal pulses Skin: No rashes, lesions or ulcers Psychiatry: Judgement and insight appear normal. Mood & affect appropriate.    Data Reviewed: I have personally reviewed following labs and imaging studies  CBC: Recent Labs  Lab 06/18/21 1802  WBC 6.7  NEUTROABS 4.1  HGB 8.4*  HCT 27.3*  MCV 91.6  PLT 99991111   Basic Metabolic Panel: Recent Labs  Lab 06/18/21 1802  NA 136  K 5.2*  CL 110  CO2 20*  GLUCOSE 124*  BUN 15  CREATININE 0.56*  CALCIUM 7.9*  MG 1.8   GFR: Estimated Creatinine Clearance: 88 mL/min (A) (by C-G formula based on SCr of 0.56 mg/dL (L)). Liver Function Tests: No results for input(s): AST, ALT, ALKPHOS, BILITOT, PROT, ALBUMIN in the last 168 hours. No results for input(s): LIPASE, AMYLASE in the last 168 hours. No results for input(s): AMMONIA in the last 168 hours. Coagulation Profile: No results for input(s): INR, PROTIME in the last 168 hours. Cardiac Enzymes: No results for input(s): CKTOTAL, CKMB, CKMBINDEX, TROPONINI in the last 168 hours. BNP (last 3 results) No results for input(s): PROBNP in the last 8760 hours. HbA1C: No results for input(s): HGBA1C in the last 72 hours. CBG: No  results for input(s): GLUCAP in the last 168 hours.  Lipid Profile: No results for input(s): CHOL, HDL, LDLCALC, TRIG, CHOLHDL, LDLDIRECT in the last 72 hours. Thyroid Function Tests: No results for input(s): TSH, T4TOTAL, FREET4, T3FREE, THYROIDAB in the last 72 hours. Anemia Panel: No results for input(s): VITAMINB12, FOLATE, FERRITIN, TIBC, IRON, RETICCTPCT in the last 72 hours. Sepsis Labs: No results for input(s): PROCALCITON, LATICACIDVEN in the last 168 hours.  No results found for this or any previous visit (from the past 240 hour(s)).       Radiology Studies: No results found.      Scheduled Meds:  calcium carbonate  1,250 mg Oral TID WC   enoxaparin (LOVENOX) injection  40 mg Subcutaneous Q24H   feeding supplement  237 mL Oral QID   magnesium oxide  400 mg Oral Daily   mirtazapine  15 mg Oral QHS   multivitamin with minerals  1 tablet Oral Daily   nicotine  21 mg Transdermal Daily   Continuous Infusions:  sodium chloride Stopped (06/04/21 1216)     LOS: 21 days  Time spent: 15 minutes    Kathie Dike, MD [facility]   If 7PM-7AM, please contact night-coverage  06/24/2021, 7:30 PM

## 2021-06-24 NOTE — Progress Notes (Signed)
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  Pt recently here for multiple months with difficult d/c planning, did ultimatley d/c to homeless shelter and was gone only a few hours before fall with L ankle and wrist fractures with return to the ED.   OT comments  Ryan [name] continues to present with generalized weakness and limited endurance and requires encouragement to engage with therapy. Today, however, he reports having less pain than he usually does, and he was very pleasant and talkative. He was able to engage in LB dressing and bed mobility INDly, required Mod A for sit<>stand with L platform walker while showing good safety awareness and adhering to WB precautions. Able to maintain static standing balance for 2+ minutes. Declined t/f to recliner at end of session, instead choosing to return to bed. Left with bed alarm activated, call bell within reach. Discussion of DC planning, improving nutritional intake (more protein, fewer graham crackers). Will continue to follow POC.   Recommendations for follow up therapy are one component of a multi-disciplinary discharge planning process, led by the attending physician.  Recommendations may be updated based on patient status, additional functional criteria and insurance authorization.    Follow Up Recommendations  SNF    Equipment Recommendations  None recommended by OT    Recommendations for Other Services      Precautions / Restrictions Precautions Precautions: Fall Precaution Comments: NWB LUE/LLE Required Braces or Orthoses: Splint/Cast Spinal Brace Comments: pt no longer using TLSO brace Splint/Cast: L  ankle/LUE Restrictions Weight Bearing Restrictions: Yes LUE Weight Bearing: Non weight bearing LLE Weight Bearing: Non weight bearing       Mobility Bed Mobility Overal bed mobility: Needs Assistance Bed Mobility: Supine to Sit;Sit to Supine     Supine to sit: Modified independent (Device/Increase time) Sit to supine: Modified independent (Device/Increase time)   General bed mobility comments: Requires encouragement to come into EOB sitting, but, once he decides to do so, he is able transition fluidly    Transfers Overall transfer level: Needs assistance Equipment used: Left platform walker Transfers: Sit to/from Stand Sit to Stand: From elevated surface;Mod assist         General transfer comment: Mod A for sit<>stand from elevated surface; Requires Mod A to loosen hand grip and remove LUE from platform    Balance Overall balance assessment: Needs assistance Sitting-balance support: Feet supported;No upper extremity supported Sitting balance-Leahy Scale: Good     Standing balance support: Bilateral upper extremity supported Standing balance-Leahy Scale: Poor Standing balance comment: Pt able to stand for 2+ minutes, w/ tremors in LE, c/o pain sole of R foot                           ADL either performed or assessed with clinical judgement   ADL Overall ADL's : Needs assistance/impaired Eating/Feeding: Set up;Modified independent                   Lower Body Dressing: Independent Lower Body Dressing Details (indicate cue type and reason): IND for donning sock  Vision       Perception     Praxis      Cognition Arousal/Alertness: Awake/alert Behavior During Therapy: WFL for tasks assessed/performed Overall Cognitive Status: Within Functional Limits for tasks assessed                                 General Comments: A&O x 4, aware of NWB precautions        Exercises Other Exercises Other  Exercises: bed mobility, transfers, LB dressing, sitting balance tolerance, standing balance tolerance, discussion re: goals of care, nutrition   Shoulder Instructions       General Comments      Pertinent Vitals/ Pain       Pain Assessment: 0-10 Pain Score: 6  Faces Pain Scale: Hurts little more Pain Location:  (bilateral feet) Pain Descriptors / Indicators: Numbness Pain Intervention(s): Repositioned;Limited activity within patient's tolerance  Home Living Family/patient expects to be discharged to:: Skilled nursing facility                                        Prior Functioning/Environment              Frequency  Min 1X/week        Progress Toward Goals  OT Goals(current goals can now be found in the care plan section)  Progress towards OT goals: Progressing toward goals  Acute Rehab OT Goals Patient Stated Goal: to get stronger OT Goal Formulation: With patient Time For Goal Achievement: 07/01/21 Potential to Achieve Goals: Good  Plan Frequency remains appropriate;Discharge plan remains appropriate    Co-evaluation        PT goals addressed during session: Mobility/safety with mobility;Balance;Proper use of DME;Strengthening/ROM        AM-PAC OT "6 Clicks" Daily Activity     Outcome Measure   Help from another person eating meals?: None Help from another person taking care of personal grooming?: A Little Help from another person toileting, which includes using toliet, bedpan, or urinal?: A Lot Help from another person bathing (including washing, rinsing, drying)?: A Little Help from another person to put on and taking off regular upper body clothing?: None Help from another person to put on and taking off regular lower body clothing?: A Little 6 Click Score: 19    End of Session Equipment Utilized During Treatment: Rolling walker;Other (comment) (L platform walker)  OT Visit Diagnosis: Unsteadiness on feet (R26.81);Muscle  weakness (generalized) (M62.81);Adult, failure to thrive (R62.7) Pain - Right/Left: Left Pain - part of body: Leg;Arm;Hand   Activity Tolerance Patient tolerated treatment well   Patient Left in bed;with call bell/phone within reach;with bed alarm set   Nurse Communication          Time: [id number] OT Time Calculation (min): 23 min  Charges: OT General Charges $OT Visit: 1 Visit OT Treatments $Self Care/Home Management : 23-37 mins  Josiah Lobo, PhD, MS, OTR/L 06/24/21, 4:08 PM

## 2021-06-25 LAB — RENAL FUNCTION PANEL
Albumin: 3.4 g/dL — ABNORMAL LOW (ref 3.5–5.0)
Anion gap: 5 (ref 5–15)
BUN: 21 mg/dL — ABNORMAL HIGH (ref 6–20)
CO2: 22 mmol/L (ref 22–32)
Calcium: 7.8 mg/dL — ABNORMAL LOW (ref 8.9–10.3)
Chloride: 106 mmol/L (ref 98–111)
Creatinine, Ser: 0.56 mg/dL — ABNORMAL LOW (ref 0.61–1.24)
GFR, Estimated: >60 mL/min (ref >60)
Glucose, Bld: 91 mg/dL (ref 70–99)
Phosphorus: 4.4 mg/dL (ref 2.5–4.6)
Potassium: 5 mmol/L (ref 3.5–5.1)
Sodium: 133 mmol/L — ABNORMAL LOW (ref 135–145)

## 2021-06-25 LAB — CBC
HCT: 27.2 % — ABNORMAL LOW (ref 39.0–52.0)
Hemoglobin: 8.4 g/dL — ABNORMAL LOW (ref 13.0–17.0)
MCH: 27.6 pg (ref 26.0–34.0)
MCHC: 30.9 g/dL (ref 30.0–36.0)
MCV: 89.5 fL (ref 80.0–100.0)
Platelets: 260 10*3/uL (ref 150–400)
RBC: 3.04 MIL/uL — ABNORMAL LOW (ref 4.22–5.81)
RDW: 16.8 % — ABNORMAL HIGH (ref 11.5–15.5)
WBC: 6.9 10*3/uL (ref 4.0–10.5)
nRBC: 0.6 % — ABNORMAL HIGH (ref 0.0–0.2)

## 2021-06-25 LAB — MAGNESIUM: Magnesium: 1.7 mg/dL (ref 1.7–2.4)

## 2021-06-25 NOTE — Plan of Care (Signed)
  Problem: Education: Goal: Knowledge of General Education information will improve Description: Including pain rating scale, medication(s)/side effects and non-pharmacologic comfort measures Outcome: Progressing   Problem: Health Behavior/Discharge Planning: Goal: Ability to manage health-related needs will improve Outcome: Progressing   Problem: Clinical Measurements: Goal: Ability to maintain clinical measurements within normal limits will improve Outcome: Progressing Goal: Will remain free from infection Outcome: Progressing Goal: Diagnostic test results will improve Outcome: Progressing Goal: Respiratory complications will improve Outcome: Progressing Goal: Cardiovascular complication will be avoided Outcome: Progressing   Problem: Activity: Goal: Risk for activity intolerance will decrease Outcome: Progressing   Problem: Nutrition: Goal: Adequate nutrition will be maintained Outcome: Progressing   Problem: Coping: Goal: Level of anxiety will decrease Outcome: Progressing   Problem: Elimination: Goal: Will not experience complications related to bowel motility Outcome: Progressing Goal: Will not experience complications related to urinary retention Outcome: Progressing   Problem: Pain Managment: Goal: General experience of comfort will improve Outcome: Progressing   Problem: Safety: Goal: Ability to remain free from injury will improve Outcome: Progressing   Problem: Skin Integrity: Goal: Risk for impaired skin integrity will decrease Outcome: Progressing   Problem: Education: Goal: Knowledge of the prescribed therapeutic regimen will improve Outcome: Progressing   Problem: Activity: Goal: Ability to increase mobility will improve Outcome: Progressing   Problem: Pain Management: Goal: Pain level will decrease with appropriate interventions Outcome: Progressing

## 2021-06-25 NOTE — Progress Notes (Signed)
PROGRESS NOTE    Ryan [name]  [id number] DOB: 10-Mar-1961 DOA: 06/02/2021 PCP: Pcp, No   Brief Narrative:  60 y.o. 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   Patient was recently hospitalized from 6/21-8/24 due to metastasized prostate cancer. Pt was seen by [name] of oncology. He got his last prostate cancer treatment yesterday. He was just discharged from the hospital yesterday. He was supposed to go to a homeless shelter but apparently there were issues when he was transported there. He sates that he he felt dizzy and fell accidentally. No LOC. He injured his left wrist and left ankle, causing severe pain in left ankle and left wrist.     Left wrist x-ray with fracture of distal radius and ulna.   CT left ankle with impacted distal tibial and fibular fractures, mild posterior displacement and angulation.  Orthopedic was consulted and they are recommending conservative management based on his other comorbidities and advanced malignancy.  Patient will be nonweightbearing her left lower extremity.  Therapy is recommended skilled nursing facility.  Medicaid application is in progress.  TOC working on placement.  As of 9/13 no bed offers   Assessment & Plan:   Principal Problem:   Closed left ankle fracture Active Problems:   Fall   Anemia of chronic disease   Protein-calorie malnutrition, severe (HCC)   Cancer cachexia (HCC)   Prostate cancer metastatic to bone (HCC)   Left wrist fracture, closed, initial encounter   Hypocalcemia   Hyperkalemia   Closed fracture of left distal radius  Closed left ankle fracture and left wrist fracture, closed, consulted [name] of ortho, given patient's metastasized prostate cancer, recommended conservative treatment.  Patient will be nonweightbearing on left lower extremity and follow-up with orthopedic  as an outpatient. Plan: As needed pain control Continue therapy evaluations TOC looking for placement, difficult to place [facility] faxed to [facility] rehab center.  Awaiting bed offer    Anemia of chronic disease Hemoglobin low but stable, no obvious bleeding Plan: Transfuse PRBC as needed hemoglobin less than 7    Hyperkalemia.  Resolved. --weekly labs to monitor   Severe protein caloric malnutrition/cancer cachexia. --gained weight, eating well now. --noted that pt has been on decadron 4 mg daily for a long time, presumably for appetite stimulation. Plan: Continue with Ensure Appetite improved Nutrition follow-up Decadron decreased to 2 mg daily as of 9/6 Discontinue steroids on 9/13    Prostate cancer metastatic to bone [facility]) Consulted [name].   Per [name], Pt was seen in hospital in previous [place].  --cont monthly prostate cancer treatment   Hypocalcemia.   Corrected calcium of 6.9.  Received total of 3 g of calcium gluconate IV. Vitamin D levels more than 200. -Continue with calcium supplement   Anxiety and depression. --cont Remeron --cont Atarax PRN   Insomnia -- Continue trazodone 150 mg nightly   DVT prophylaxis: SQ Lovenox Code Status: DNR Family Communication: None today Disposition Plan: Status is: Inpatient  Remains inpatient appropriate because:Inpatient level of care appropriate due to severity of illness, unsafe discharge plan  Dispo: The patient is from: Home              Anticipated d/c is to:  TBD              Patient currently is medically stable to d/c.   Difficult to place patient Yes  Level of care: Med-Surg  Consultants:  None  Procedures:  None  Antimicrobials:  None   Subjective: No new complaints today  Objective: Vitals:   06/25/21 0543 06/25/21 0840 06/25/21 1215 06/25/21 1643  BP: (!) 102/59 (!) 100/55 (!) 109/59 (!) 106/56  Pulse: (!) 106 100 (!) 109 (!) 101  Resp: '16 17 17 18  '$ Temp: 98.6  F (37 C) 98.6 F (37 C)  97.9 F (36.6 C)  TempSrc:    Oral  SpO2: 96% 97% 94% 98%  Weight:      Height:        Intake/Output Summary (Last 24 hours) at 06/25/2021 2011 Last data filed at 06/25/2021 1030 Gross per 24 hour  Intake 120 ml  Output --  Net 120 ml    Filed Weights   06/02/21 1634 06/16/21 1000 06/23/21 1545  Weight: 52 kg 59.8 kg 62.6 kg    Examination:  General exam: Alert, awake, oriented x 3 Respiratory system: Clear to auscultation. Respiratory effort normal. Cardiovascular system:RRR. No murmurs, rubs, gallops. Gastrointestinal system: Abdomen is nondistended, soft and nontender. No organomegaly or masses felt. Normal bowel sounds heard. Central nervous system: Alert and oriented. No focal neurological deficits. Extremities: No C/C/E, +pedal pulses Skin: No rashes, lesions or ulcers Psychiatry: Judgement and insight appear normal. Mood & affect appropriate.     Data Reviewed: I have personally reviewed following labs and imaging studies  CBC: Recent Labs  Lab 06/25/21 0518  WBC 6.9  HGB 8.4*  HCT 27.2*  MCV 89.5  PLT 123456   Basic Metabolic Panel: Recent Labs  Lab 06/25/21 0518  NA 133*  K 5.0  CL 106  CO2 22  GLUCOSE 91  BUN 21*  CREATININE 0.56*  CALCIUM 7.8*  MG 1.7  PHOS 4.4   GFR: Estimated Creatinine Clearance: 88 mL/min (A) (by C-G formula based on SCr of 0.56 mg/dL (L)). Liver Function Tests: Recent Labs  Lab 06/25/21 0518  ALBUMIN 3.4*   No results for input(s): LIPASE, AMYLASE in the last 168 hours. No results for input(s): AMMONIA in the last 168 hours. Coagulation Profile: No results for input(s): INR, PROTIME in the last 168 hours. Cardiac Enzymes: No results for input(s): CKTOTAL, CKMB, CKMBINDEX, TROPONINI in the last 168 hours. BNP (last 3 results) No results for input(s): PROBNP in the last 8760 hours. HbA1C: No results for input(s): HGBA1C in the last 72 hours. CBG: No results for input(s): GLUCAP in the  last 168 hours.  Lipid Profile: No results for input(s): CHOL, HDL, LDLCALC, TRIG, CHOLHDL, LDLDIRECT in the last 72 hours. Thyroid Function Tests: No results for input(s): TSH, T4TOTAL, FREET4, T3FREE, THYROIDAB in the last 72 hours. Anemia Panel: No results for input(s): VITAMINB12, FOLATE, FERRITIN, TIBC, IRON, RETICCTPCT in the last 72 hours. Sepsis Labs: No results for input(s): PROCALCITON, LATICACIDVEN in the last 168 hours.  No results found for this or any previous visit (from the past 240 hour(s)).       Radiology Studies: No results found.      Scheduled Meds:  calcium carbonate  1,250 mg Oral TID WC   enoxaparin (LOVENOX) injection  40 mg Subcutaneous Q24H   feeding supplement  237 mL Oral QID   magnesium oxide  400 mg Oral Daily   mirtazapine  15 mg Oral QHS   multivitamin with minerals  1 tablet Oral Daily   nicotine  21 mg Transdermal Daily   Continuous Infusions:  sodium chloride Stopped (06/04/21 1216)  LOS: 22 days    Time spent: 15 minutes    Kathie Dike, MD [facility]   If 7PM-7AM, please contact night-coverage  06/25/2021, 8:11 PM

## 2021-06-25 NOTE — TOC Progression Note (Signed)
Transition of Care [facility]) - Progression Note    Patient Details  Name: Ryan [name] MRN: [id number] Date of Birth: August 30, 1961  Transition of Care [facility]) CM/SW [place], RN Phone Number: 06/25/2021, 1:17 PM  Clinical Narrative:    Spoke with the patient's aunt on the phone and she stated that she has not gotten any word or paperwork,  Called the servant center in [place] to inquire about disability and adult medicaid, the Adult medicaid will not go into effect until Disability is approved, Spoke with Jenell Milliner they spoke with the patient today, They stated that there is paper work that needs to be submitted, I explained that the patient is in the hospital still and that his Aunt has not gotten any paperwork, I explained that the address for [employer] is not accurate, she stated that they also have the aunts address and that where the mail would have went to, [id number] Elnora Morrison, She checked again and said they actually did receive the paper work and it [employer] was filed but there is no status update, they will follow up with it , she stated that it was filed 8/31 and they hope to hear back soon       Expected Discharge Plan and Services                                                 Social Determinants of Health (SDOH) Interventions    Readmission Risk Interventions Readmission Risk Prevention Plan 04/23/2021  Transportation Screening Complete  PCP or Specialist Appt within 3-5 Days Complete  HRI or Home Care Consult Complete  Social Work Consult for [place] Planning/Counseling Not Complete  SW consult not completed comments RNCM assigned to patient  Palliative Care Screening Complete  Medication Review [employer]) Complete  Some recent data might be hidden

## 2021-06-26 NOTE — Progress Notes (Signed)
PROGRESS NOTE    Ryan [name]  [id number] DOB: November 16, 1960 DOA: 06/02/2021 PCP: Pcp, No   Brief Narrative:  60 y.o. 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   Patient was recently hospitalized from 6/21-8/24 due to metastasized prostate cancer. Pt was seen by Dr. Grayland [name] of oncology. He got his last prostate cancer treatment yesterday. He was just discharged from the hospital yesterday. He was supposed to go to a homeless shelter but apparently there were issues when he was transported there. He sates that he he felt dizzy and fell accidentally. No LOC. He injured his left wrist and left ankle, causing severe pain in left ankle and left wrist.     Left wrist x-ray with fracture of distal radius and ulna.   CT left ankle with impacted distal tibial and fibular fractures, mild posterior displacement and angulation.  Orthopedic was consulted and they are recommending conservative management based on his other comorbidities and advanced malignancy.  Patient will be nonweightbearing her left lower extremity.  Therapy is recommended skilled nursing facility.  Medicaid application is in progress.  TOC working on placement.  As of 9/13 no bed offers   Assessment & Plan:   Principal Problem:   Closed left ankle fracture Active Problems:   Fall   Anemia of chronic disease   Protein-calorie malnutrition, severe (HCC)   Cancer cachexia (HCC)   Prostate cancer metastatic to bone (HCC)   Left wrist fracture, closed, initial encounter   Hypocalcemia   Hyperkalemia   Closed fracture of left distal radius  Closed left ankle fracture and left wrist fracture, closed, consulted Dr. Posey [name] of ortho, given patient's metastasized prostate cancer, recommended conservative treatment.  Patient will be nonweightbearing on left lower extremity and follow-up with orthopedic  as an outpatient. Plan: As needed pain control Continue therapy evaluations TOC looking for placement, difficult to place [facility] faxed to [facility] rehab center.  Awaiting bed offer    Anemia of chronic disease Hemoglobin low but stable, no obvious bleeding Plan: Transfuse PRBC as needed hemoglobin less than 7    Hyperkalemia.  Resolved. --weekly labs to monitor   Severe protein caloric malnutrition/cancer cachexia. --gained weight, eating well now. --noted that pt has been on decadron 4 mg daily for a long time, presumably for appetite stimulation. Plan: Continue with Ensure Appetite improved Nutrition follow-up Decadron decreased to 2 mg daily as of 9/6 Discontinue steroids on 9/13    Prostate cancer metastatic to bone [facility]) Consulted Dr. Grayland [name].   Per Dr. Grayland [name], Pt was seen in hospital in previous [place].  --cont monthly prostate cancer treatment   Hypocalcemia.   Corrected calcium of 6.9.  Received total of 3 g of calcium gluconate IV. Vitamin D levels more than 200. -Continue with calcium supplement   Anxiety and depression. --cont Remeron --cont Atarax PRN   Insomnia -- Continue trazodone 150 mg nightly   DVT prophylaxis: SQ Lovenox Code Status: DNR Family Communication: None today Disposition Plan: Status is: Inpatient  Remains inpatient appropriate because:Inpatient level of care appropriate due to severity of illness, unsafe discharge plan  Dispo: The patient is from: Home              Anticipated d/c is to:  TBD              Patient currently is medically stable to d/c.   Difficult to place patient Yes  Level of care: Med-Surg  Consultants:  None  Procedures:  None  Antimicrobials:  None   Subjective: Patient lying in bed, denies any pain, shortness of breath, nausea or vomiting.  Objective: Vitals:   06/26/21 1200 06/26/21 1300 06/26/21 1700 06/26/21 1752  BP: 98/63 108/60 97/60 (!) 95/57  Pulse: (!) 104 99 (!) 102  98  Resp: '18 18 18 18  '$ Temp: 99.2 F (37.3 C) 98.8 F (37.1 C) 98.6 F (37 C) 98.6 F (37 C)  TempSrc: Oral Oral Oral Oral  SpO2: 96% 96% 100% 100%  Weight:      Height:        Intake/Output Summary (Last 24 hours) at 06/26/2021 1834 Last data filed at 06/25/2021 2100 Gross per 24 hour  Intake 240 ml  Output 100 ml  Net 140 ml    Filed Weights   06/02/21 1634 06/16/21 1000 06/23/21 1545  Weight: 52 kg 59.8 kg 62.6 kg    Examination:  General exam: Alert, awake, oriented x 3 Respiratory system: Clear to auscultation. Respiratory effort normal. Cardiovascular system:RRR. No murmurs, rubs, gallops. Gastrointestinal system: Abdomen is nondistended, soft and nontender. No organomegaly or masses felt. Normal bowel sounds heard. Central nervous system: Alert and oriented. No focal neurological deficits. Extremities: No C/C/E, +pedal pulses Skin: No rashes, lesions or ulcers Psychiatry: Judgement and insight appear normal. Mood & affect appropriate.     Data Reviewed: I have personally reviewed following labs and imaging studies  CBC: Recent Labs  Lab 06/25/21 0518  WBC 6.9  HGB 8.4*  HCT 27.2*  MCV 89.5  PLT 123456   Basic Metabolic Panel: Recent Labs  Lab 06/25/21 0518  NA 133*  K 5.0  CL 106  CO2 22  GLUCOSE 91  BUN 21*  CREATININE 0.56*  CALCIUM 7.8*  MG 1.7  PHOS 4.4   GFR: Estimated Creatinine Clearance: 88 mL/min (A) (by C-G formula based on SCr of 0.56 mg/dL (L)). Liver Function Tests: Recent Labs  Lab 06/25/21 0518  ALBUMIN 3.4*   No results for input(s): LIPASE, AMYLASE in the last 168 hours. No results for input(s): AMMONIA in the last 168 hours. Coagulation Profile: No results for input(s): INR, PROTIME in the last 168 hours. Cardiac Enzymes: No results for input(s): CKTOTAL, CKMB, CKMBINDEX, TROPONINI in the last 168 hours. BNP (last 3 results) No results for input(s): PROBNP in the last 8760 hours. HbA1C: No results for input(s):  HGBA1C in the last 72 hours. CBG: No results for input(s): GLUCAP in the last 168 hours.  Lipid Profile: No results for input(s): CHOL, HDL, LDLCALC, TRIG, CHOLHDL, LDLDIRECT in the last 72 hours. Thyroid Function Tests: No results for input(s): TSH, T4TOTAL, FREET4, T3FREE, THYROIDAB in the last 72 hours. Anemia Panel: No results for input(s): VITAMINB12, FOLATE, FERRITIN, TIBC, IRON, RETICCTPCT in the last 72 hours. Sepsis Labs: No results for input(s): PROCALCITON, LATICACIDVEN in the last 168 hours.  No results found for this or any previous visit (from the past 240 hour(s)).       Radiology Studies: No results found.      Scheduled Meds:  calcium carbonate  1,250 mg Oral TID WC   enoxaparin (LOVENOX) injection  40 mg Subcutaneous Q24H   feeding supplement  237 mL Oral QID   magnesium oxide  400 mg Oral Daily   mirtazapine  15 mg Oral QHS   multivitamin with minerals  1 tablet Oral Daily   nicotine  21 mg Transdermal Daily   Continuous Infusions:  sodium chloride Stopped (06/04/21 1216)     LOS: 23 days    Time spent: 15 minutes    Kathie Dike, MD [facility]   If 7PM-7AM, please contact night-coverage  06/26/2021, 6:34 PM

## 2021-06-26 NOTE — Progress Notes (Signed)
   06/26/21 1752  Assess: MEWS Score  Temp 98.6 F (37 C)  BP (!) 95/57  Pulse Rate 98  Resp 18  Level of Consciousness Alert  SpO2 100 %  O2 Device Room Air  Assess: MEWS Score  MEWS Temp 0  MEWS Systolic 1  MEWS Pulse 0  MEWS RR 0  MEWS LOC 0  MEWS Score 1  MEWS Score Color Green  Assess: if the MEWS score is Yellow or Red  Were vital signs taken at a resting state? Yes  Focused Assessment No change from prior assessment  Does the patient meet 2 or more of the SIRS criteria? No  Does the patient have a confirmed or suspected source of infection? No  MEWS guidelines implemented *See Row Information* No, vital signs rechecked  Document  Patient Outcome Other (Comment) (rechecked vitals)  Progress note created (see row info) Yes  Assess: SIRS CRITERIA  SIRS Temperature  0  SIRS Pulse 1  SIRS Respirations  0  SIRS WBC 0  SIRS Score Sum  1

## 2021-06-26 NOTE — Plan of Care (Signed)
  Problem: Education: Goal: Knowledge of General Education information will improve Description: Including pain rating scale, medication(s)/side effects and non-pharmacologic comfort measures Outcome: Progressing   Problem: Health Behavior/Discharge Planning: Goal: Ability to manage health-related needs will improve Outcome: Progressing   Problem: Clinical Measurements: Goal: Ability to maintain clinical measurements within normal limits will improve Outcome: Progressing Goal: Will remain free from infection Outcome: Progressing Goal: Diagnostic test results will improve Outcome: Progressing Goal: Respiratory complications will improve Outcome: Progressing Goal: Cardiovascular complication will be avoided Outcome: Progressing   Problem: Activity: Goal: Risk for activity intolerance will decrease Outcome: Progressing   Problem: Nutrition: Goal: Adequate nutrition will be maintained Outcome: Progressing   Problem: Coping: Goal: Level of anxiety will decrease Outcome: Progressing   Problem: Elimination: Goal: Will not experience complications related to bowel motility Outcome: Progressing Goal: Will not experience complications related to urinary retention Outcome: Progressing   Problem: Pain Managment: Goal: General experience of comfort will improve Outcome: Progressing   Problem: Safety: Goal: Ability to remain free from injury will improve Outcome: Progressing   Problem: Skin Integrity: Goal: Risk for impaired skin integrity will decrease Outcome: Progressing   Problem: Education: Goal: Knowledge of the prescribed therapeutic regimen will improve Outcome: Progressing   Problem: Activity: Goal: Ability to increase mobility will improve Outcome: Progressing   Problem: Pain Management: Goal: Pain level will decrease with appropriate interventions Outcome: Progressing

## 2021-06-27 NOTE — Plan of Care (Signed)
No acute events during the night. VSS. Refused lovenox during the night.  Problem: Education: Goal: Knowledge of General Education information will improve Description: Including pain rating scale, medication(s)/side effects and non-pharmacologic comfort measures Outcome: Progressing   Problem: Health Behavior/Discharge Planning: Goal: Ability to manage health-related needs will improve Outcome: Progressing   Problem: Clinical Measurements: Goal: Ability to maintain clinical measurements within normal limits will improve Outcome: Progressing Goal: Will remain free from infection Outcome: Progressing Goal: Diagnostic test results will improve Outcome: Progressing Goal: Respiratory complications will improve Outcome: Progressing Goal: Cardiovascular complication will be avoided Outcome: Progressing   Problem: Activity: Goal: Risk for activity intolerance will decrease Outcome: Progressing   Problem: Nutrition: Goal: Adequate nutrition will be maintained Outcome: Progressing   Problem: Coping: Goal: Level of anxiety will decrease Outcome: Progressing   Problem: Elimination: Goal: Will not experience complications related to bowel motility Outcome: Progressing Goal: Will not experience complications related to urinary retention Outcome: Progressing   Problem: Pain Managment: Goal: General experience of comfort will improve Outcome: Progressing   Problem: Safety: Goal: Ability to remain free from injury will improve Outcome: Progressing   Problem: Skin Integrity: Goal: Risk for impaired skin integrity will decrease Outcome: Progressing   Problem: Education: Goal: Knowledge of the prescribed therapeutic regimen will improve Outcome: Progressing   Problem: Activity: Goal: Ability to increase mobility will improve Outcome: Progressing   Problem: Pain Management: Goal: Pain level will decrease with appropriate interventions Outcome: Progressing

## 2021-06-27 NOTE — Progress Notes (Signed)
PROGRESS NOTE    Ryan [name]  [id number] DOB: 1960-12-11 DOA: 06/02/2021 PCP: Pcp, No   Brief Narrative:  60 y.o. 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   Patient was recently hospitalized from 6/21-8/24 due to metastasized prostate cancer. Pt was seen by [name] of oncology. He got his last prostate cancer treatment yesterday. He was just discharged from the hospital yesterday. He was supposed to go to a homeless shelter but apparently there were issues when he was transported there. He sates that he he felt dizzy and fell accidentally. No LOC. He injured his left wrist and left ankle, causing severe pain in left ankle and left wrist.     Left wrist x-ray with fracture of distal radius and ulna.   CT left ankle with impacted distal tibial and fibular fractures, mild posterior displacement and angulation.  Orthopedic was consulted and they are recommending conservative management based on his other comorbidities and advanced malignancy.  Patient will be nonweightbearing her left lower extremity.  Therapy is recommended skilled nursing facility.  Medicaid application is in progress.  TOC working on placement.  As of 9/13 no bed offers   Assessment & Plan:   Principal Problem:   Closed left ankle fracture Active Problems:   Fall   Anemia of chronic disease   Protein-calorie malnutrition, severe (HCC)   Cancer cachexia (HCC)   Prostate cancer metastatic to bone (HCC)   Left wrist fracture, closed, initial encounter   Hypocalcemia   Hyperkalemia   Closed fracture of left distal radius  Closed left ankle fracture and left wrist fracture, closed, consulted [name] of ortho, given patient's metastasized prostate cancer, recommended conservative treatment.  Patient will be nonweightbearing on left lower extremity and follow-up with orthopedic  as an outpatient. Plan: As needed pain control Continue therapy evaluations TOC looking for placement, difficult to place [facility] faxed to [facility] rehab center.  Awaiting bed offer    Anemia of chronic disease Hemoglobin low but stable, no obvious bleeding Plan: Transfuse PRBC as needed hemoglobin less than 7    Hyperkalemia.  Resolved. --weekly labs to monitor   Severe protein caloric malnutrition/cancer cachexia. --gained weight, eating well now. --noted that pt has been on decadron 4 mg daily for a long time, presumably for appetite stimulation. Plan: Continue with Ensure Appetite improved Nutrition follow-up Decadron decreased to 2 mg daily as of 9/6 Discontinue steroids on 9/13    Prostate cancer metastatic to bone [facility]) Consulted [name].   Per [name], Pt was seen in hospital in previous [place].  --cont monthly prostate cancer treatment   Hypocalcemia.   Corrected calcium of 6.9.  Received total of 3 g of calcium gluconate IV. Vitamin D levels more than 200. -Continue with calcium supplement   Anxiety and depression. --cont Remeron --cont Atarax PRN   Insomnia -- Continue trazodone 150 mg nightly   DVT prophylaxis: SQ Lovenox Code Status: DNR Family Communication: None today Disposition Plan: Status is: Inpatient  Remains inpatient appropriate because:Inpatient level of care appropriate due to severity of illness, unsafe discharge plan  Dispo: The patient is from: Home              Anticipated d/c is to:  TBD              Patient currently is medically stable to d/c.   Difficult to place patient Yes  Level of care: Med-Surg  Consultants:  None  Procedures:  None  Antimicrobials:  None   Subjective: No complaints of pain or shortness of breath.  No nausea or vomiting.  Objective: Vitals:   06/27/21 0531 06/27/21 0808 06/27/21 1300 06/27/21 1532  BP: 117/63 (!) 1'03/59 95/60 93/60 '$  Pulse: (!) 108 (!) 102 98 100   Resp:  17  14  Temp: 98.4 F (36.9 C) 98.1 F (36.7 C) 98.3 F (36.8 C) 98.6 F (37 C)  TempSrc: Oral  Oral   SpO2: 95% 93% 95% 97%  Weight:      Height:        Intake/Output Summary (Last 24 hours) at 06/27/2021 1945 Last data filed at 06/27/2021 1725 Gross per 24 hour  Intake --  Output 1050 ml  Net -1050 ml    Filed Weights   06/02/21 1634 06/16/21 1000 06/23/21 1545  Weight: 52 kg 59.8 kg 62.6 kg    Examination:  General exam: Alert, awake, oriented x 3 Respiratory system: Clear to auscultation. Respiratory effort normal. Cardiovascular system:RRR. No murmurs, rubs, gallops. Gastrointestinal system: Abdomen is nondistended, soft and nontender. No organomegaly or masses felt. Normal bowel sounds heard. Central nervous system: Alert and oriented. No focal neurological deficits. Extremities: No C/C/E, +pedal pulses Skin: No rashes, lesions or ulcers Psychiatry: Judgement and insight appear normal. Mood & affect appropriate.      Data Reviewed: I have personally reviewed following labs and imaging studies  CBC: Recent Labs  Lab 06/25/21 0518  WBC 6.9  HGB 8.4*  HCT 27.2*  MCV 89.5  PLT 123456   Basic Metabolic Panel: Recent Labs  Lab 06/25/21 0518  NA 133*  K 5.0  CL 106  CO2 22  GLUCOSE 91  BUN 21*  CREATININE 0.56*  CALCIUM 7.8*  MG 1.7  PHOS 4.4   GFR: Estimated Creatinine Clearance: 88 mL/min (A) (by C-G formula based on SCr of 0.56 mg/dL (L)). Liver Function Tests: Recent Labs  Lab 06/25/21 0518  ALBUMIN 3.4*   No results for input(s): LIPASE, AMYLASE in the last 168 hours. No results for input(s): AMMONIA in the last 168 hours. Coagulation Profile: No results for input(s): INR, PROTIME in the last 168 hours. Cardiac Enzymes: No results for input(s): CKTOTAL, CKMB, CKMBINDEX, TROPONINI in the last 168 hours. BNP (last 3 results) No results for input(s): PROBNP in the last 8760 hours. HbA1C: No results for input(s): HGBA1C in the  last 72 hours. CBG: No results for input(s): GLUCAP in the last 168 hours.  Lipid Profile: No results for input(s): CHOL, HDL, LDLCALC, TRIG, CHOLHDL, LDLDIRECT in the last 72 hours. Thyroid Function Tests: No results for input(s): TSH, T4TOTAL, FREET4, T3FREE, THYROIDAB in the last 72 hours. Anemia Panel: No results for input(s): VITAMINB12, FOLATE, FERRITIN, TIBC, IRON, RETICCTPCT in the last 72 hours. Sepsis Labs: No results for input(s): PROCALCITON, LATICACIDVEN in the last 168 hours.  No results found for this or any previous visit (from the past 240 hour(s)).       Radiology Studies: No results found.      Scheduled Meds:  calcium carbonate  1,250 mg Oral TID WC   enoxaparin (LOVENOX) injection  40 mg Subcutaneous Q24H   feeding supplement  237 mL Oral QID   magnesium oxide  400 mg Oral Daily   mirtazapine  15 mg Oral QHS   multivitamin with minerals  1 tablet Oral Daily   nicotine  21 mg Transdermal Daily   Continuous Infusions:  sodium chloride Stopped (06/04/21 1216)     LOS: 24 days    Time spent: 15 minutes    Kathie Dike, MD [facility]   If 7PM-7AM, please contact night-coverage  06/27/2021, 7:45 PM

## 2021-06-28 LAB — BASIC METABOLIC PANEL
Anion gap: 8 (ref 5–15)
BUN: 10 mg/dL (ref 6–20)
CO2: 20 mmol/L — ABNORMAL LOW (ref 22–32)
Calcium: 7.7 mg/dL — ABNORMAL LOW (ref 8.9–10.3)
Chloride: 102 mmol/L (ref 98–111)
Creatinine, Ser: 0.56 mg/dL — ABNORMAL LOW (ref 0.61–1.24)
GFR, Estimated: >60 mL/min (ref >60)
Glucose, Bld: 124 mg/dL — ABNORMAL HIGH (ref 70–99)
Potassium: 4 mmol/L (ref 3.5–5.1)
Sodium: 130 mmol/L — ABNORMAL LOW (ref 135–145)

## 2021-06-28 MED ORDER — SODIUM CHLORIDE 0.9 % IV BOLUS
500.0000 mL | Freq: Once | INTRAVENOUS | Status: AC
  Administered 2021-06-28: 500 mL via INTRAVENOUS

## 2021-06-28 MED ORDER — SODIUM CHLORIDE 0.9 % IV SOLN: INTRAVENOUS | Status: AC

## 2021-06-28 MED ORDER — SODIUM CHLORIDE 0.9 % IV BOLUS: 250.0000 mL | Freq: Once | INTRAVENOUS | Status: DC

## 2021-06-28 NOTE — Plan of Care (Signed)
  Problem: Education: Goal: Knowledge of General Education information will improve Description: Including pain rating scale, medication(s)/side effects and non-pharmacologic comfort measures Outcome: Progressing   Problem: Health Behavior/Discharge Planning: Goal: Ability to manage health-related needs will improve Outcome: Progressing   Problem: Clinical Measurements: Goal: Ability to maintain clinical measurements within normal limits will improve Outcome: Progressing Goal: Will remain free from infection Outcome: Progressing Goal: Diagnostic test results will improve Outcome: Progressing Goal: Respiratory complications will improve Outcome: Progressing Goal: Cardiovascular complication will be avoided Outcome: Progressing   Problem: Activity: Goal: Risk for activity intolerance will decrease Outcome: Progressing   Problem: Nutrition: Goal: Adequate nutrition will be maintained Outcome: Progressing   Problem: Coping: Goal: Level of anxiety will decrease Outcome: Progressing   Problem: Elimination: Goal: Will not experience complications related to bowel motility Outcome: Progressing Goal: Will not experience complications related to urinary retention Outcome: Progressing   Problem: Pain Managment: Goal: General experience of comfort will improve Outcome: Progressing   Problem: Safety: Goal: Ability to remain free from injury will improve Outcome: Progressing   Problem: Skin Integrity: Goal: Risk for impaired skin integrity will decrease Outcome: Progressing   Problem: Education: Goal: Knowledge of the prescribed therapeutic regimen will improve Outcome: Progressing   Problem: Activity: Goal: Ability to increase mobility will improve Outcome: Progressing   Problem: Pain Management: Goal: Pain level will decrease with appropriate interventions Outcome: Progressing

## 2021-06-28 NOTE — TOC Progression Note (Signed)
Transition of Care [facility]) - Progression Note    Patient Details  Name: Kamaurion Dedes MRN: [id number] Date of Birth: [birth year]  Transition of Care [facility]) CM/SW [place], RN Phone Number: 06/28/2021, 11:06 AM  Clinical Narrative:   The servant center in [place] called and stated that they reached out to Disability to get an update and SS would not provide them with any information and stated that the patient had applied prior to August when they helped the patient apply, they stated that SS informed them that the patient had an attorney working on his behalf and they could not get any information, She stated that they would continue to try to figure out what is going on, I provided her with the room phone number to be able to speak to the paitent        Expected Discharge Plan and Services                                                 Social Determinants of Health (SDOH) Interventions    Readmission Risk Interventions Readmission Risk Prevention Plan 04/23/2021  Transportation Screening Complete  PCP or Specialist Appt within 3-5 Days Complete  HRI or Home Care Consult Complete  Social Work Consult for [place] Planning/Counseling Not Complete  SW consult not completed comments RNCM assigned to patient  Palliative Care Screening Complete  Medication Review [employer]) Complete  Some recent data might be hidden

## 2021-06-28 NOTE — Progress Notes (Signed)
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  Pt recently here for multiple months with difficult d/c planning, did ultimatley d/c to homeless shelter and was gone only a few hours before fall with L ankle and wrist fractures with return to the ED.   OT comments  Ryan [name] presents today with fatigue and mild dizziness, following several days of low BP. He denies pain, declines OOB activity today. He is willing, however, to engage in therapeutic activity seated EOB, requiring frequent reminders to maintain his full seated posture, as he develops L lateral lean when he tires. Able to use his L UE to manipulate a variety of small objects, with moderate precision. Will continue to follow POC.   Recommendations for follow up therapy are one component of a multi-disciplinary discharge planning process, led by the attending physician.  Recommendations may be updated based on patient status, additional functional criteria and insurance authorization.    Follow Up Recommendations  SNF    Equipment Recommendations  None recommended by OT    Recommendations for Other Services      Precautions / Restrictions Precautions Precautions: Fall Precaution Comments: NWB LUE/LLE Required Braces or Orthoses: Splint/Cast Splint/Cast: L ankle/LUE Restrictions LUE Weight Bearing: Non weight bearing LLE Weight Bearing: Non weight bearing       Mobility Bed Mobility Overal bed mobility: Needs Assistance Bed Mobility: Supine to Sit;Sit to Supine     Supine to sit: Modified independent (Device/Increase time) Sit to supine: Modified independent (Device/Increase time)         Transfers                      Balance Overall balance assessment: Needs assistance Sitting-balance support: Feet supported;No upper extremity supported Sitting balance-Leahy Scale: Good Sitting balance - Comments: able to sit EOB while engaging in fxl activity for 25+ minutes; with fatigue, demonstrates L lateral lean, can self-correct after given VC to do so Postural control: Left lateral lean                                 ADL either performed or assessed with clinical judgement   ADL                                               Vision       Perception     Praxis      Cognition Arousal/Alertness: Awake/alert Behavior During Therapy: WFL for tasks assessed/performed Overall Cognitive Status: Within Functional Limits for tasks assessed                                 General Comments: A&O x 4, aware of NWB precautions        Exercises Other Exercises Other Exercises: therex using L UE in sitting, while adhering to WB restrictions   Shoulder Instructions       General Comments      Pertinent Vitals/  Pain       Pain Score: 0-No pain  Home Living                                          Prior Functioning/Environment              Frequency  Min 1X/week        Progress Toward Goals  OT Goals(current goals can now be found in the care plan section)  Progress towards OT goals: Progressing toward goals  Acute Rehab OT Goals Patient Stated Goal: to get stronger OT Goal Formulation: With patient Time For Goal Achievement: 07/01/21 Potential to Achieve Goals: Good  Plan Frequency remains appropriate;Discharge plan remains appropriate    Co-evaluation                 AM-PAC OT "6 Clicks" Daily Activity     Outcome Measure   Help from another person eating meals?: None Help from another person taking care of personal grooming?: A Little Help from another person  toileting, which includes using toliet, bedpan, or urinal?: A Lot Help from another person bathing (including washing, rinsing, drying)?: A Little Help from another person to put on and taking off regular upper body clothing?: A Little Help from another person to put on and taking off regular lower body clothing?: A Little 6 Click Score: 18    End of Session    OT Visit Diagnosis: Unsteadiness on feet (R26.81);Muscle weakness (generalized) (M62.81);Adult, failure to thrive (R62.7) Pain - Right/Left: Left Pain - part of body: Leg;Arm;Hand   Activity Tolerance Patient tolerated treatment well   Patient Left in bed;with call bell/phone within reach;with bed alarm set   Nurse Communication          Time: [id number] OT Time Calculation (min): 36 min  Charges: OT General Charges $OT Visit: 1 Visit OT Treatments $Self Care/Home Management : 23-37 mins  Josiah Lobo, PhD, MS, OTR/L 06/28/21, 3:21 PM

## 2021-06-28 NOTE — Progress Notes (Signed)
PROGRESS NOTE    Ryan [name]  [id number] DOB: 17-Aug-1961 DOA: 06/02/2021 PCP: Pcp, No   Brief Narrative:  60 y.o. 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   Patient was recently hospitalized from 6/21-8/24 due to metastasized prostate cancer. Pt was seen by [name] of oncology. He got his last prostate cancer treatment yesterday. He was just discharged from the hospital yesterday. He was supposed to go to a homeless shelter but apparently there were issues when he was transported there. He sates that he he felt dizzy and fell accidentally. No LOC. He injured his left wrist and left ankle, causing severe pain in left ankle and left wrist.     Left wrist x-ray with fracture of distal radius and ulna.   CT left ankle with impacted distal tibial and fibular fractures, mild posterior displacement and angulation.  Orthopedic was consulted and they are recommending conservative management based on his other comorbidities and advanced malignancy.  Patient will be nonweightbearing her left lower extremity.  Therapy is recommended skilled nursing facility.  Medicaid application is in progress.  TOC working on placement.  As of 9/13 no bed offers   Assessment & Plan:   Principal Problem:   Closed left ankle fracture Active Problems:   Fall   Anemia of chronic disease   Protein-calorie malnutrition, severe (HCC)   Cancer cachexia (HCC)   Prostate cancer metastatic to bone (HCC)   Left wrist fracture, closed, initial encounter   Hypocalcemia   Hyperkalemia   Closed fracture of left distal radius  Closed left ankle fracture and left wrist fracture, closed, consulted [name] of ortho, given patient's metastasized prostate cancer, recommended conservative treatment.  Patient will be nonweightbearing on left lower extremity and follow-up with orthopedic  as an outpatient. Plan: As needed pain control Continue therapy evaluations TOC looking for placement, difficult to place [facility] faxed to [facility] rehab center.  Awaiting bed offer    Anemia of chronic disease Hemoglobin low but stable, no obvious bleeding Plan: Transfuse PRBC as needed hemoglobin less than 7    Hyperkalemia.  Resolved. --weekly labs to monitor   Severe protein caloric malnutrition/cancer cachexia. --gained weight, eating well now. --noted that pt has been on decadron 4 mg daily for a long time, presumably for appetite stimulation. Plan: Continue with Ensure Appetite improved Nutrition follow-up Decadron decreased to 2 mg daily as of 9/6 Discontinue steroids on 9/13    Prostate cancer metastatic to bone [facility]) Consulted [name].   Per [name], Pt was seen in hospital in previous [place].  --cont monthly prostate cancer treatment   Hypocalcemia.   Corrected calcium of 6.9.  Received total of 3 g of calcium gluconate IV. Vitamin D levels more than 200. -Continue with calcium supplement   Anxiety and depression. --cont Remeron --cont Atarax PRN   Insomnia -- Continue trazodone 150 mg nightly   DVT prophylaxis: SQ Lovenox Code Status: DNR Family Communication: None today Disposition Plan: Status is: Inpatient  Remains inpatient appropriate because:Inpatient level of care appropriate due to severity of illness, unsafe discharge plan  Dispo: The patient is from: Home              Anticipated d/c is to:  TBD              Patient currently is medically stable to d/c.   Difficult to place patient Yes  Level of care: Med-Surg  Consultants:  None  Procedures:  None  Antimicrobials:  None   Subjective: No shortness of breath, no lightheadedness  Objective: Vitals:   06/28/21 0849 06/28/21 1307 06/28/21 1718 06/28/21 1944  BP: (!) 94/53 97/60 (!) 111/59 104/68  Pulse: (!) 107 97 (!) 105 (!) 107  Resp: '15 16 16 19   '$ Temp: 98.3 F (36.8 C) 97.7 F (36.5 C) 97.8 F (36.6 C) 98.7 F (37.1 C)  TempSrc: Oral Oral  Oral  SpO2: 95% 97% 98% 97%  Weight:      Height:        Intake/Output Summary (Last 24 hours) at 06/28/2021 2034 Last data filed at 06/28/2021 1913 Gross per 24 hour  Intake 360 ml  Output 575 ml  Net -215 ml    Filed Weights   06/02/21 1634 06/16/21 1000 06/23/21 1545  Weight: 52 kg 59.8 kg 62.6 kg    Examination:  General exam: Alert, awake, oriented x 3 Respiratory system: Clear to auscultation. Respiratory effort normal. Cardiovascular system:RRR. No murmurs, rubs, gallops. Gastrointestinal system: Abdomen is nondistended, soft and nontender. No organomegaly or masses felt. Normal bowel sounds heard. Central nervous system: Alert and oriented. No focal neurological deficits. Extremities: No C/C/E, +pedal pulses Skin: No rashes, lesions or ulcers Psychiatry: Judgement and insight appear normal. Mood & affect appropriate.      Data Reviewed: I have personally reviewed following labs and imaging studies  CBC: Recent Labs  Lab 06/25/21 0518  WBC 6.9  HGB 8.4*  HCT 27.2*  MCV 89.5  PLT 123456   Basic Metabolic Panel: Recent Labs  Lab 06/25/21 0518 06/28/21 1022  NA 133* 130*  K 5.0 4.0  CL 106 102  CO2 22 20*  GLUCOSE 91 124*  BUN 21* 10  CREATININE 0.56* 0.56*  CALCIUM 7.8* 7.7*  MG 1.7  --   PHOS 4.4  --    GFR: Estimated Creatinine Clearance: 88 mL/min (A) (by C-G formula based on SCr of 0.56 mg/dL (L)). Liver Function Tests: Recent Labs  Lab 06/25/21 0518  ALBUMIN 3.4*   No results for input(s): LIPASE, AMYLASE in the last 168 hours. No results for input(s): AMMONIA in the last 168 hours. Coagulation Profile: No results for input(s): INR, PROTIME in the last 168 hours. Cardiac Enzymes: No results for input(s): CKTOTAL, CKMB, CKMBINDEX, TROPONINI in the last 168 hours. BNP (last 3 results) No results for input(s): PROBNP in the last 8760  hours. HbA1C: No results for input(s): HGBA1C in the last 72 hours. CBG: No results for input(s): GLUCAP in the last 168 hours.  Lipid Profile: No results for input(s): CHOL, HDL, LDLCALC, TRIG, CHOLHDL, LDLDIRECT in the last 72 hours. Thyroid Function Tests: No results for input(s): TSH, T4TOTAL, FREET4, T3FREE, THYROIDAB in the last 72 hours. Anemia Panel: No results for input(s): VITAMINB12, FOLATE, FERRITIN, TIBC, IRON, RETICCTPCT in the last 72 hours. Sepsis Labs: No results for input(s): PROCALCITON, LATICACIDVEN in the last 168 hours.  No results found for this or any previous visit (from the past 240 hour(s)).       Radiology Studies: No results found.      Scheduled Meds:  calcium carbonate  1,250 mg Oral TID WC   enoxaparin (LOVENOX) injection  40 mg Subcutaneous Q24H   feeding supplement  237 mL Oral QID   magnesium oxide  400 mg Oral Daily   mirtazapine  15 mg Oral QHS   multivitamin with minerals  1 tablet Oral Daily  nicotine  21 mg Transdermal Daily   Continuous Infusions:  sodium chloride Stopped (06/04/21 1216)   sodium chloride       LOS: 25 days    Time spent: 15 minutes    Kathie Dike, MD [facility]   If 7PM-7AM, please contact night-coverage  06/28/2021, 8:34 PM

## 2021-06-28 NOTE — Progress Notes (Signed)
   06/28/21 1029  Provider Notification  Provider Name/Title Dr. Roderic Palau  Date Provider Notified 06/28/21  Time Provider Notified 1029  Notification Type Page  Notification Reason Other (Comment) (refusing lovenox)  Provider response Other (Comment) (waiting for response)

## 2021-06-29 LAB — CBC
HCT: 25.7 % — ABNORMAL LOW (ref 39.0–52.0)
Hemoglobin: 8.1 g/dL — ABNORMAL LOW (ref 13.0–17.0)
MCH: 27.9 pg (ref 26.0–34.0)
MCHC: 31.5 g/dL (ref 30.0–36.0)
MCV: 88.6 fL (ref 80.0–100.0)
Platelets: 195 10*3/uL (ref 150–400)
RBC: 2.9 MIL/uL — ABNORMAL LOW (ref 4.22–5.81)
RDW: 15.8 % — ABNORMAL HIGH (ref 11.5–15.5)
WBC: 6.9 10*3/uL (ref 4.0–10.5)
nRBC: 0 % (ref 0.0–0.2)

## 2021-06-29 MED ORDER — MIDODRINE HCL 5 MG PO TABS
5.0000 mg | ORAL_TABLET | Freq: Three times a day (TID) | ORAL | Status: DC
  Administered 2021-06-29 – 2021-08-31 (×184): 5 mg via ORAL
  Filled 2021-06-29 (×177): qty 1

## 2021-06-29 MED ORDER — RIVAROXABAN 10 MG PO TABS
10.0000 mg | ORAL_TABLET | Freq: Every day | ORAL | Status: DC
  Administered 2021-06-29 – 2021-08-30 (×62): 10 mg via ORAL
  Filled 2021-06-29 (×65): qty 1

## 2021-06-29 NOTE — Progress Notes (Signed)
PROGRESS NOTE    Ryan [name]  [id number]:[id number] DOB: [birth year] DOA: 06/02/2021 PCP: Pcp, No   Brief Narrative:  60 y.o. 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   Patient was recently hospitalized from 6/21-8/24 due to metastasized prostate cancer. Pt was seen by [name] of oncology. He got his last prostate cancer treatment yesterday. He was just discharged from the hospital yesterday. He was supposed to go to a homeless shelter but apparently there were issues when he was transported there. He sates that he he felt dizzy and fell accidentally. No LOC. He injured his left wrist and left ankle, causing severe pain in left ankle and left wrist.     Left wrist x-ray with fracture of distal radius and ulna.   CT left ankle with impacted distal tibial and fibular fractures, mild posterior displacement and angulation.  Orthopedic was consulted and they are recommending conservative management based on his other comorbidities and advanced malignancy.  Patient will be nonweightbearing her left lower extremity.  Therapy is recommended skilled nursing facility.  Medicaid application is in progress.  TOC working on placement.  As of 9/20 no bed offers   Assessment & Plan:   Principal Problem:   Closed left ankle fracture Active Problems:   Fall   Anemia of chronic disease   Protein-calorie malnutrition, severe (HCC)   Cancer cachexia (HCC)   Prostate cancer metastatic to bone (HCC)   Left wrist fracture, closed, initial encounter   Hypocalcemia   Hyperkalemia   Closed fracture of left distal radius  Closed left ankle fracture and left wrist fracture, closed, consulted [name] of ortho, given patient's metastasized prostate cancer, recommended conservative treatment.  Patient will be nonweightbearing on left lower extremity and follow-up with orthopedic  as an outpatient. Plan: As needed pain control Continue therapy evaluations TOC looking for placement, difficult to place [facility] faxed to [facility] rehab center.  Awaiting bed offer    Anemia of chronic disease Hemoglobin low but stable, no obvious bleeding Plan: Transfuse PRBC as needed hemoglobin less than 7    Hyperkalemia.  Resolved. --weekly labs to monitor   Severe protein caloric malnutrition/cancer cachexia. --gained weight, eating well now. --noted that pt has been on decadron 4 mg daily for a long time, presumably for appetite stimulation. Plan: Continue with Ensure Appetite improved Nutrition follow-up Decadron decreased to 2 mg daily as of 9/6 Discontinued steroids on 9/13  Hypotension -Review of previous discharge summary show that patient was discharged with midodrine -We will restart at low-dose.   Prostate cancer metastatic to bone [facility]) Consulted [name].   Per [name], Pt was seen in hospital in previous [place].  --cont monthly prostate cancer treatment   Hypocalcemia.   Corrected calcium of 6.9.  Received total of 3 g of calcium gluconate IV. Vitamin D levels more than 200. -Continue with calcium supplement  Hyponatremia -Started on saline infusion -Recheck in a.m.   Anxiety and depression. --cont Remeron --cont Atarax PRN   Insomnia -- Continue trazodone 150 mg nightly   DVT prophylaxis: Patient is refusing Lovenox since he does not want injections.  Agreeable to take Xarelto for DVT prophylaxis Code Status: DNR Family Communication: None today Disposition Plan: Status is: Inpatient  Remains inpatient appropriate because:Inpatient level of care appropriate due to severity of illness, unsafe discharge plan  Dispo: The patient is from: Home  Anticipated d/c is to:  TBD              Patient currently is medically stable to d/c.   Difficult to place patient Yes       Level of care: Med-Surg  Consultants:   None  Procedures:  None  Antimicrobials:  None   Subjective: Denies any new complaints.  No shortness of breath.  No significant pain.  Objective: Vitals:   06/29/21 0819 06/29/21 1255 06/29/21 1544 06/29/21 2028  BP: (!) 93/58 98/60 (!) 97/53 (!) 103/54  Pulse: (!) 112 (!) 110 99 (!) 110  Resp: 15  17 18   Temp: 97.7 F (36.5 C)  98.3 F (36.8 C) 98.7 F (37.1 C)  TempSrc:    Oral  SpO2: 96% 96% 94% 92%  Weight:      Height:        Intake/Output Summary (Last 24 hours) at 06/29/2021 2104 Last data filed at 06/29/2021 2017 Gross per 24 hour  Intake 1064.93 ml  Output 1050 ml  Net 14.93 ml    Filed Weights   06/02/21 1634 06/16/21 1000 06/23/21 1545  Weight: 52 kg 59.8 kg 62.6 kg    Examination:  General exam: Alert, awake, oriented x 3 Respiratory system: Clear to auscultation. Respiratory effort normal. Cardiovascular system:RRR. No murmurs, rubs, gallops. Gastrointestinal system: Abdomen is nondistended, soft and nontender. No organomegaly or masses felt. Normal bowel sounds heard. Central nervous system: Alert and oriented. No focal neurological deficits. Extremities: No C/C/E, +pedal pulses Skin: No rashes, lesions or ulcers Psychiatry: Judgement and insight appear normal. Mood & affect appropriate.      Data Reviewed: I have personally reviewed following labs and imaging studies  CBC: Recent Labs  Lab 06/25/21 0518  WBC 6.9  HGB 8.4*  HCT 27.2*  MCV 89.5  PLT 179   Basic Metabolic Panel: Recent Labs  Lab 06/25/21 0518 06/28/21 1022  NA 133* 130*  K 5.0 4.0  CL 106 102  CO2 22 20*  GLUCOSE 91 124*  BUN 21* 10  CREATININE 0.56* 0.56*  CALCIUM 7.8* 7.7*  MG 1.7  --   PHOS 4.4  --    GFR: Estimated Creatinine Clearance: 88 mL/min (A) (by C-G formula based on SCr of 0.56 mg/dL (L)). Liver Function Tests: Recent Labs  Lab 06/25/21 0518  ALBUMIN 3.4*   No results for input(s): LIPASE, AMYLASE in the last 168 hours. No results  for input(s): AMMONIA in the last 168 hours. Coagulation Profile: No results for input(s): INR, PROTIME in the last 168 hours. Cardiac Enzymes: No results for input(s): CKTOTAL, CKMB, CKMBINDEX, TROPONINI in the last 168 hours. BNP (last 3 results) No results for input(s): PROBNP in the last 8760 hours. HbA1C: No results for input(s): HGBA1C in the last 72 hours. CBG: No results for input(s): GLUCAP in the last 168 hours.  Lipid Profile: No results for input(s): CHOL, HDL, LDLCALC, TRIG, CHOLHDL, LDLDIRECT in the last 72 hours. Thyroid Function Tests: No results for input(s): TSH, T4TOTAL, FREET4, T3FREE, THYROIDAB in the last 72 hours. Anemia Panel: No results for input(s): VITAMINB12, FOLATE, FERRITIN, TIBC, IRON, RETICCTPCT in the last 72 hours. Sepsis Labs: No results for input(s): PROCALCITON, LATICACIDVEN in the last 168 hours.  No results found for this or any previous visit (from the past 240 hour(s)).       Radiology Studies: No results found.      Scheduled Meds:  calcium carbonate  1,250 mg Oral TID WC  feeding supplement  237 mL Oral QID   magnesium oxide  400 mg Oral Daily   midodrine  5 mg Oral TID WC   mirtazapine  15 mg Oral QHS   multivitamin with minerals  1 tablet Oral Daily   nicotine  21 mg Transdermal Daily   Continuous Infusions:  sodium chloride Stopped (06/04/21 1216)   sodium chloride 100 mL/hr at 06/29/21 1253     LOS: 26 days    Time spent: 15 minutes    Kathie Dike, MD [facility]   If 7PM-7AM, please contact night-coverage  06/29/2021, 9:04 PM

## 2021-06-29 NOTE — Progress Notes (Signed)
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  Pt recently here for multiple months with difficult d/c planning, did ultimatley d/c to homeless shelter and was gone only a few hours before fall with L ankle and wrist fractures with return to the ED.    PT Comments    Pt was pleasant and motivated to participate during the session and put forth good effort during the session.  Pt required no physical assistance with bed mobility tasks but did report mild dizziness upon coming to sitting.  Pt's BP taken at the beginning of the session in supine at 92/59 and then again in sitting at 87/69, nursing notifed.  Pt's report of dizziness resolved quickly in sitting and pt was able to stand on multiple attempts without dizziness symptoms.  Pt maintained LUE/LLE NWB status throughout the session with cues for sequencing.  Pt will benefit from PT services in a SNF setting upon discharge to safely address deficits listed in patient problem list for decreased caregiver assistance and eventual return to PLOF.      Recommendations for follow up therapy are one component of a multi-disciplinary discharge planning process, led by the attending physician.  Recommendations may be updated based on patient status, additional functional criteria and insurance authorization.  Follow Up Recommendations  SNF;Supervision/Assistance - 24 hour     Equipment Recommendations  Other (comment) (TBD at next venue of care)    Recommendations for Other Services       Precautions / Restrictions Precautions Precautions: Fall Restrictions LUE Weight Bearing: Non weight bearing LLE Weight Bearing: Non  weight bearing Other Position/Activity Restrictions: NWB LUE/LLE, LUE platform walker in room     Mobility  Bed Mobility Overal bed mobility: Modified Independent             General bed mobility comments: Min extra time and effort but no physical assistance needed with sup to/from sit    Transfers Overall transfer level: Needs assistance Equipment used: Left platform walker Transfers: Sit to/from Stand Sit to Stand: +2 physical assistance;Mod assist;From elevated surface         General transfer comment: Mod verbal cues for LUE/LLE WB compliance with pt's L foot placed on this PT's foot to ensure compliance  Ambulation/Gait             General Gait Details: unable   Stairs             Wheelchair Mobility    Modified Rankin (Stroke Patients Only)       Balance Overall balance assessment: Needs assistance Sitting-balance support: Single extremity supported Sitting balance-Leahy Scale: Good Sitting balance - Comments: Min verbal cues for neutral posture     Standing balance-Leahy Scale: Poor Standing balance comment: +2 assist for stability and to ensure LLE WB compliance                            Cognition Arousal/Alertness: Awake/alert Behavior During Therapy: WFL for tasks assessed/performed Overall Cognitive Status: Within Functional Limits for tasks assessed  Exercises Total Joint Exercises Ankle Circles/Pumps: AROM;Strengthening;Right;10 reps (with manual resistance) Quad Sets: Strengthening;Both;10 reps;5 reps Gluteal Sets: Strengthening;Both;10 reps;5 reps Heel Slides: Strengthening;10 reps;Right Hip ABduction/ADduction: Strengthening;Both;10 reps (with manual resistance) Straight Leg Raises: Strengthening;Both;10 reps Long Arc Quad: Strengthening;Both;10 reps;15 reps;AROM Knee Flexion: 15 reps;10 reps;Both;Strengthening;AROM Bridges: Strengthening;Right;10  reps Other Exercises Other Exercises: Sit to/from stand x 2 with max static standing tolerance 30-60 sec with +2 assist for stability and LLE WB compliance Other Exercises: HEP education/review for RLE APs and BLE GS, QS, and hip abd/add x 10 each 2-3x/day Pt education provided on physiological benefits of activity    General Comments        Pertinent Vitals/Pain Pain Assessment: No/denies pain    Home Living                      Prior Function            PT Goals (current goals can now be found in the care plan section) Progress towards PT goals: PT to reassess next treatment    Frequency    Min 2X/week      PT Plan Current plan remains appropriate    Co-evaluation              AM-PAC PT "6 Clicks" Mobility   Outcome Measure  Help needed turning from your back to your side while in a flat bed without using bedrails?: A Little Help needed moving from lying on your back to sitting on the side of a flat bed without using bedrails?: A Little Help needed moving to and from a bed to a chair (including a wheelchair)?: A Lot Help needed standing up from a chair using your arms (e.g., wheelchair or bedside chair)?: A Lot Help needed to walk in hospital room?: Total Help needed climbing 3-5 steps with a railing? : Total 6 Click Score: 12    End of Session Equipment Utilized During Treatment: Gait belt Activity Tolerance: Patient tolerated treatment well Patient left: in bed;with call bell/phone within reach;with bed alarm set Nurse Communication: Mobility status PT Visit Diagnosis: Unsteadiness on feet (R26.81);Muscle weakness (generalized) (M62.81);Difficulty in walking, not elsewhere classified (R26.2);History of falling (Z91.81) Pain - Right/Left: Left Pain - part of body: Ankle and joints of foot     Time: 1025-1103 PT Time Calculation (min) (ACUTE ONLY): 38 min  Charges:  $Therapeutic Exercise: 23-37 mins $Therapeutic Activity: 8-22 mins                      D. Scott Revere Maahs PT, DPT 06/29/21, 1:54 PM

## 2021-06-29 NOTE — Progress Notes (Signed)
Nutrition Follow-up  DOCUMENTATION CODES:  Severe malnutrition in context of chronic illness, Underweight  INTERVENTION:  Continue regular diet.  Continue Ensure QID.  Continue MVI with minerals.  Continue snacks TID.  Weigh patient weekly.  NUTRITION DIAGNOSIS: Severe Malnutrition related to chronic illness, cancer and cancer related treatments as evidenced by severe fat depletion, severe muscle depletion. 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 Recently admitted 6/21-8/24. Discharged to homeless shelter then returned to ED a few hours later after a fall and pain to his wrist.  Pt medically stable for discharge, awaiting placement.  Pt's intake appears to have decreased, but also documentation very limited. Pt still accepting Ensures at this time.  Average Meal Intake: 8/27-8/29: 72% intake x 2 recorded meals (50-94%) 8/30-9/7: 63% intake x 2 recorded meals (25-100%) 9/8-9/14: 50% intake x 3 recorded meals (50%) 9/15-9/20: 35% intake x 2 recorded meals (20-50%)  Pt has been gaining weight, able to see some fat stores restored to face. Continue to track weight trends weekly.  Continue current nutrition plan.  Supplements: Ensure QID  Medications: reviewed; Os-Cal TID, Mag-Ox, midodrine TID, Remeron, MVI with minerals, NaCl @ 100 ml/hr  Labs: reviewed; Na 130 (L), Glucose 124 (H)  Diet Order:   Diet Order             Diet regular Room service appropriate? Yes; Fluid consistency: Thin  Diet effective now                  EDUCATION NEEDS:  Education needs have been addressed  Skin:  Skin Assessment: Reviewed RN Assessment  Last BM:   06/28/21  Height:  Ht Readings from Last 1 Encounters:  06/02/21 6\' 5"  (1.956 m)   Weight:  Wt Readings from Last 1 Encounters:  06/23/21 62.6 kg   Ideal Body Weight:  94.5 kg  BMI:  Body mass index is 16.36 kg/m.  Estimated Nutritional Needs:  Kcal:  2400-2600 Protein:  130-150 grams Fluid:  >2.4 L  Derrel Nip, RD, LDN (she/her/hers) Registered Dietitian I After-Hours/Weekend Pager # in [place]

## 2021-06-29 NOTE — Plan of Care (Signed)
  Problem: Education: Goal: Knowledge of General Education information will improve Description: Including pain rating scale, medication(s)/side effects and non-pharmacologic comfort measures Outcome: Progressing   Problem: Health Behavior/Discharge Planning: Goal: Ability to manage health-related needs will improve Outcome: Progressing   Problem: Clinical Measurements: Goal: Ability to maintain clinical measurements within normal limits will improve Outcome: Progressing Goal: Will remain free from infection Outcome: Progressing Goal: Diagnostic test results will improve Outcome: Progressing Goal: Respiratory complications will improve Outcome: Progressing Goal: Cardiovascular complication will be avoided Outcome: Progressing   Problem: Activity: Goal: Risk for activity intolerance will decrease Outcome: Progressing   Problem: Nutrition: Goal: Adequate nutrition will be maintained Outcome: Progressing   Problem: Coping: Goal: Level of anxiety will decrease Outcome: Progressing   Problem: Elimination: Goal: Will not experience complications related to bowel motility Outcome: Progressing Goal: Will not experience complications related to urinary retention Outcome: Progressing   Problem: Pain Managment: Goal: General experience of comfort will improve Outcome: Progressing   Problem: Safety: Goal: Ability to remain free from injury will improve Outcome: Progressing   Problem: Skin Integrity: Goal: Risk for impaired skin integrity will decrease Outcome: Progressing   Problem: Education: Goal: Knowledge of the prescribed therapeutic regimen will improve Outcome: Progressing   Problem: Activity: Goal: Ability to increase mobility will improve Outcome: Progressing   Problem: Pain Management: Goal: Pain level will decrease with appropriate interventions Outcome: Progressing

## 2021-06-30 LAB — RENAL FUNCTION PANEL
Albumin: 3.1 g/dL — ABNORMAL LOW (ref 3.5–5.0)
Anion gap: 5 (ref 5–15)
BUN: 7 mg/dL (ref 6–20)
CO2: 21 mmol/L — ABNORMAL LOW (ref 22–32)
Calcium: 8 mg/dL — ABNORMAL LOW (ref 8.9–10.3)
Chloride: 109 mmol/L (ref 98–111)
Creatinine, Ser: 0.58 mg/dL — ABNORMAL LOW (ref 0.61–1.24)
GFR, Estimated: >60 mL/min (ref >60)
Glucose, Bld: 93 mg/dL (ref 70–99)
Phosphorus: 3.4 mg/dL (ref 2.5–4.6)
Potassium: 4.9 mmol/L (ref 3.5–5.1)
Sodium: 135 mmol/L (ref 135–145)

## 2021-06-30 LAB — MAGNESIUM: Magnesium: 1.5 mg/dL — ABNORMAL LOW (ref 1.7–2.4)

## 2021-06-30 MED ORDER — MAGNESIUM SULFATE 2 GM/50ML IV SOLN
2.0000 g | Freq: Once | INTRAVENOUS | Status: AC
  Administered 2021-06-30: 2 g via INTRAVENOUS
  Filled 2021-06-30: qty 50

## 2021-06-30 NOTE — TOC Progression Note (Signed)
Transition of Care [facility]) - Progression Note    Patient Details  Name: Ryan [name] MRN: [id number] Date of Birth: March 24, 1961  Transition of Care [facility]) CM/SW [place], RN Phone Number: 06/30/2021, 11:42 AM  Clinical Narrative:    Reviewed case in DTP meeting regarding concerns for patient having previously started disability application through an attorney which prevents servant center from being able to assist. Patient will need to provide the name of attorney that previously started application to rescind the application.         Expected Discharge Plan and Services                                                 Social Determinants of Health (SDOH) Interventions    Readmission Risk Interventions Readmission Risk Prevention Plan 04/23/2021  Transportation Screening Complete  PCP or Specialist Appt within 3-5 Days Complete  HRI or [place] Complete  Social Work Consult for [place] Planning/Counseling Not Complete  SW consult not completed comments RNCM assigned to patient  Palliative Care Screening Complete  Medication Review [employer]) Complete  Some recent data might be hidden

## 2021-06-30 NOTE — Progress Notes (Signed)
PROGRESS NOTE    Ryan [name]  GHW:[id number] DOB: [birth year] DOA: 06/02/2021 PCP: Pcp, No    Brief Narrative:  60 y.o. 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   Patient was recently hospitalized from 6/21-8/24 due to metastasized prostate cancer. Pt was seen by [name] of oncology. He got his last prostate cancer treatment yesterday. He was just discharged from the hospital yesterday. He was supposed to go to a homeless shelter but apparently there were issues when he was transported there. He sates that he he felt dizzy and fell accidentally. No LOC. He injured his left wrist and left ankle, causing severe pain in left ankle and left wrist.     Left wrist x-ray with fracture of distal radius and ulna.   CT left ankle with impacted distal tibial and fibular fractures, mild posterior displacement and angulation.  Orthopedic was consulted and they are recommending conservative management based on his other comorbidities and advanced malignancy.  Patient will be nonweightbearing her left lower extremity.   Therapy is recommended skilled nursing facility.  Medicaid application is in progress.  TOC working on placement.  As of 9/20 no bed offers   Assessment & Plan:   Principal Problem:   Closed left ankle fracture Active Problems:   Fall   Anemia of chronic disease   Protein-calorie malnutrition, severe (HCC)   Cancer cachexia (HCC)   Prostate cancer metastatic to bone (HCC)   Left wrist fracture, closed, initial encounter   Hypocalcemia   Hyperkalemia   Closed fracture of left distal radius  Closed left ankle fracture and left wrist fracture, closed, consulted [name] of ortho, given patient's metastasized prostate cancer, recommended conservative treatment.  Patient will be nonweightbearing on left lower extremity and follow-up with  orthopedic as an outpatient. Plan: As needed pain control Continue therapy evaluations TOC looking for placement, difficult to place [facility] faxed to [facility] rehab center.  Awaiting bed offer Disability remains pending   Anemia of chronic disease Hemoglobin low but stable, no obvious bleeding Plan: Transfuse PRBC as needed hemoglobin less than 7     Hyperkalemia.  Resolved. --weekly labs to monitor   Severe protein caloric malnutrition/cancer cachexia. --gained weight, eating well now. --noted that pt has been on decadron 4 mg daily for a long time, presumably for appetite stimulation. --Steroids discontinued after 9/13 Plan: Continue with Ensure Appetite improved Nutrition follow-up    Hypotension -Review of previous discharge summary show that patient was discharged with midodrine -Continue 5 mg p.o. 3 times daily   Prostate cancer metastatic to bone (HCC) Consulted [name].   Per [name], Pt was seen in hospital in previous [place].  --cont monthly prostate cancer treatment   Hypocalcemia.   Corrected calcium of 6.9.  Received total of 3 g of calcium gluconate IV. Vitamin D levels more than 200. -Continue with calcium supplement   Hyponatremia -Started on saline infusion -Recheck in a.m.   Anxiety and depression. --cont Remeron --cont Atarax PRN   Insomnia -- Continue trazodone 150 mg nightly   DVT prophylaxis: Xarelto Code Status: DNR Family Communication: None Disposition Plan: Status is: Inpatient  Remains inpatient appropriate because:Unsafe d/c plan  Dispo: The patient is from: Home              Anticipated d/c is to:  TBD  Patient currently is medically stable to d/c.   Difficult to place patient Yes       Level of care: Med-Surg  Consultants:  None  Procedures:  None  Antimicrobials:  None   Subjective: Seen and examined.  Sitting in bed eating.  No visible distress.  No complaints.  Objective: Vitals:    06/29/21 1544 06/29/21 2028 06/30/21 0408 06/30/21 0849  BP: (!) 97/53 (!) 103/54 (!) 111/57 (!) 105/54  Pulse: 99 (!) 110 (!) 110 (!) 106  Resp: 17 18 18 16   Temp: 98.3 F (36.8 C) 98.7 F (37.1 C) 98.1 F (36.7 C) 98.3 F (36.8 C)  TempSrc:  Oral Oral   SpO2: 94% 92% 94% 98%  Weight:      Height:        Intake/Output Summary (Last 24 hours) at 06/30/2021 1056 Last data filed at 06/29/2021 2017 Gross per 24 hour  Intake --  Output 800 ml  Net -800 ml   Filed Weights   06/02/21 1634 06/16/21 1000 06/23/21 1545  Weight: 52 kg 59.8 kg 62.6 kg    Examination:  General exam: No acute distress.  Appears frail and chronically ill Respiratory system: Lungs clear.  Normal work of breathing.  Room air Cardiovascular system: S1-S2, RRR, no murmurs, no pedal edema Gastrointestinal system: Thin/scaphoid, nontender, nondistended, normal bowel sounds Central nervous system: Alert and oriented. No focal neurological deficits. Extremities: Decreased power symmetrically Skin: No rashes, lesions or ulcers Psychiatry: Judgement and insight appear normal. Mood & affect appropriate.     Data Reviewed: I have personally reviewed following labs and imaging studies  CBC: Recent Labs  Lab 06/25/21 0518 06/29/21 2118  WBC 6.9 6.9  HGB 8.4* 8.1*  HCT 27.2* 25.7*  MCV 89.5 88.6  PLT 260 884   Basic Metabolic Panel: Recent Labs  Lab 06/25/21 0518 06/28/21 1022 06/30/21 0353  NA 133* 130* 135  K 5.0 4.0 4.9  CL 106 102 109  CO2 22 20* 21*  GLUCOSE 91 124* 93  BUN 21* 10 7  CREATININE 0.56* 0.56* 0.58*  CALCIUM 7.8* 7.7* 8.0*  MG 1.7  --  1.5*  PHOS 4.4  --  3.4   GFR: Estimated Creatinine Clearance: 88 mL/min (A) (by C-G formula based on SCr of 0.58 mg/dL (L)). Liver Function Tests: Recent Labs  Lab 06/25/21 0518 06/30/21 0353  ALBUMIN 3.4* 3.1*   No results for input(s): LIPASE, AMYLASE in the last 168 hours. No results for input(s): AMMONIA in the last 168  hours. Coagulation Profile: No results for input(s): INR, PROTIME in the last 168 hours. Cardiac Enzymes: No results for input(s): CKTOTAL, CKMB, CKMBINDEX, TROPONINI in the last 168 hours. BNP (last 3 results) No results for input(s): PROBNP in the last 8760 hours. HbA1C: No results for input(s): HGBA1C in the last 72 hours. CBG: No results for input(s): GLUCAP in the last 168 hours. Lipid Profile: No results for input(s): CHOL, HDL, LDLCALC, TRIG, CHOLHDL, LDLDIRECT in the last 72 hours. Thyroid Function Tests: No results for input(s): TSH, T4TOTAL, FREET4, T3FREE, THYROIDAB in the last 72 hours. Anemia Panel: No results for input(s): VITAMINB12, FOLATE, FERRITIN, TIBC, IRON, RETICCTPCT in the last 72 hours. Sepsis Labs: No results for input(s): PROCALCITON, LATICACIDVEN in the last 168 hours.  No results found for this or any previous visit (from the past 240 hour(s)).       Radiology Studies: No results found.      Scheduled Meds:  calcium carbonate  1,250  mg Oral TID WC   feeding supplement  237 mL Oral QID   magnesium oxide  400 mg Oral Daily   midodrine  5 mg Oral TID WC   mirtazapine  15 mg Oral QHS   multivitamin with minerals  1 tablet Oral Daily   nicotine  21 mg Transdermal Daily   rivaroxaban  10 mg Oral Q supper   Continuous Infusions:  sodium chloride Stopped (06/04/21 1216)     LOS: 27 days    Time spent: 25 minutes    Sidney Ace, MD [facility] Pager [phone]  If 7PM-7AM, please contact night-coverage 06/30/2021, 10:56 AM

## 2021-06-30 NOTE — TOC Progression Note (Addendum)
Transition of Care [facility]) - Progression Note    Patient Details  Name: Ryan [name] MRN: [id number] Date of Birth: 07/04/1961  Transition of Care [facility]) CM/SW [place], RN Phone Number: 06/30/2021, 2:05 PM  Clinical Narrative:     Spoke with the patient and inquired which attorney he has hired, he stated he did not know that I need to call his aunt.  I called the Aunt Cecelia and inquired, she said a long time ago the reached out to [employer] and asked them for assistance on applying for disability, She stated that they were not aware that they actually did apply for the disability, She provided the information for the attorney, Benjamine Mola [phone], [place] [facility] sent a form for the patient to complete allowing to release the Attorney. I brought the form into the patient's room and he signed it, I emailed back to Supervisor to forward to party that needs.      Expected Discharge Plan and Services                                                 Social Determinants of Health (SDOH) Interventions    Readmission Risk Interventions Readmission Risk Prevention Plan 04/23/2021  Transportation Screening Complete  PCP or Specialist Appt within 3-5 Days Complete  HRI or [place] Complete  Social Work Consult for [place] Planning/Counseling Not Complete  SW consult not completed comments RNCM assigned to patient  Palliative Care Screening Complete  Medication Review Press photographer) Complete  Some recent data might be hidden

## 2021-06-30 NOTE — TOC Progression Note (Signed)
Transition of Care [facility]) - Progression Note    Patient Details  Name: Ryan [name] MRN: [id number] Date of Birth: 13-Nov-1960  Transition of Care [facility]) CM/SW [place], RN Phone Number: 06/30/2021, 9:43 AM  Clinical Narrative:   TOC continues to search for a bed and resent all of the bed requests, The patient's attorney called him on the phone but the patient did not answer stating "that is just the lawyer calling"  His disability is still pending, Unable to get any information since there is an attorney now involved        Expected Discharge Plan and Services                                                 Social Determinants of Health (SDOH) Interventions    Readmission Risk Interventions Readmission Risk Prevention Plan 04/23/2021  Transportation Screening Complete  PCP or Specialist Appt within 3-5 Days Complete  HRI or Home Care Consult Complete  Social Work Consult for [place] Planning/Counseling Not Complete  SW consult not completed comments RNCM assigned to patient  Palliative Care Screening Complete  Medication Review Press photographer) Complete  Some recent data might be hidden

## 2021-06-30 NOTE — Plan of Care (Signed)
  Problem: Education: Goal: Knowledge of General Education information will improve Description: Including pain rating scale, medication(s)/side effects and non-pharmacologic comfort measures 06/30/2021 0129 by Romeo Rabon, RN Outcome: Progressing 06/30/2021 0129 by Romeo Rabon, RN Outcome: Progressing   Problem: Health Behavior/Discharge Planning: Goal: Ability to manage health-related needs will improve 06/30/2021 0129 by Romeo Rabon, RN Outcome: Progressing 06/30/2021 0129 by Romeo Rabon, RN Outcome: Progressing   Problem: Clinical Measurements: Goal: Ability to maintain clinical measurements within normal limits will improve 06/30/2021 0129 by Romeo Rabon, RN Outcome: Progressing 06/30/2021 0129 by Romeo Rabon, RN Outcome: Progressing Goal: Will remain free from infection 06/30/2021 0129 by Romeo Rabon, RN Outcome: Progressing 06/30/2021 0129 by Romeo Rabon, RN Outcome: Progressing Goal: Diagnostic test results will improve 06/30/2021 0129 by Romeo Rabon, RN Outcome: Progressing 06/30/2021 0129 by Romeo Rabon, RN Outcome: Progressing Goal: Respiratory complications will improve 06/30/2021 0129 by Romeo Rabon, RN Outcome: Progressing 06/30/2021 0129 by Romeo Rabon, RN Outcome: Progressing Goal: Cardiovascular complication will be avoided 06/30/2021 0129 by Romeo Rabon, RN Outcome: Progressing 06/30/2021 0129 by Romeo Rabon, RN Outcome: Progressing   Problem: Activity: Goal: Risk for activity intolerance will decrease 06/30/2021 0129 by Romeo Rabon, RN Outcome: Progressing 06/30/2021 0129 by Romeo Rabon, RN Outcome: Progressing   Problem: Nutrition: Goal: Adequate nutrition will be maintained 06/30/2021 0129 by Romeo Rabon, RN Outcome: Progressing 06/30/2021 0129 by Romeo Rabon, RN Outcome: Progressing   Problem: Coping: Goal: Level of anxiety will decrease Outcome: Progressing   Problem: Elimination: Goal: Will not  experience complications related to bowel motility Outcome: Progressing Goal: Will not experience complications related to urinary retention Outcome: Progressing   Problem: Pain Managment: Goal: General experience of comfort will improve Outcome: Progressing   Problem: Safety: Goal: Ability to remain free from injury will improve Outcome: Progressing   Problem: Skin Integrity: Goal: Risk for impaired skin integrity will decrease Outcome: Progressing   Problem: Education: Goal: Knowledge of the prescribed therapeutic regimen will improve Outcome: Progressing   Problem: Activity: Goal: Ability to increase mobility will improve Outcome: Progressing   Problem: Pain Management: Goal: Pain level will decrease with appropriate interventions Outcome: Progressing

## 2021-07-01 ENCOUNTER — Ambulatory Visit: Payer: Medicaid Other | Admitting: Oncology

## 2021-07-01 ENCOUNTER — Ambulatory Visit: Payer: Medicaid Other

## 2021-07-01 ENCOUNTER — Inpatient Hospital Stay: Payer: MEDICAID | Admitting: Oncology

## 2021-07-01 ENCOUNTER — Other Ambulatory Visit: Payer: Medicaid Other

## 2021-07-01 ENCOUNTER — Inpatient Hospital Stay: Payer: MEDICAID

## 2021-07-01 NOTE — Progress Notes (Signed)
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  Pt recently here for multiple months with difficult d/c planning, did ultimatley d/c to homeless shelter and was gone only a few hours before fall with L ankle and wrist fractures with return to the ED.   OT comments  Pt seen for OT tx this date to f/u re: safety with ADLs/ADL mobility. Pt generally pleasant and compliant with OT requests this date. Initial portion of session spent going over OT POC and updating/upgrading goals accordingly based on pt progress. Also OT spends time educating pt re: rationale behind attempting to extend his time spent OOB, increase standing tolerance, increase instances in which he is participating in graded tasks rather than resting in bed. Pt with good understanding. OT engages pt in LB dressing tasks with MIN A and STS with platform RW from EOB with MOD/MAX A from elevated surface. Pt requires MIN cues for hand placement/sequence. Pt returned to bed with MOD I. Left with all needs met and in reach. Goals updated to reflect progress or continued to allow more time for pt to progress. See care plan. Will continue to follow.    Recommendations for follow up therapy are one component of a multi-disciplinary discharge planning process, led by the attending physician.  Recommendations may be updated based on patient status, additional functional criteria and insurance authorization.    Follow Up Recommendations  SNF    Equipment Recommendations  None recommended by OT    Recommendations for Other Services      Precautions / Restrictions Precautions Precautions: Fall Precaution Comments: NWB  LUE/LLE Required Braces or Orthoses: Splint/Cast Splint/Cast: L ankle/LUE Restrictions Weight Bearing Restrictions: Yes LUE Weight Bearing: Non weight bearing LLE Weight Bearing: Non weight bearing Other Position/Activity Restrictions: NWB LUE/LLE, LUE platform walker in room       Mobility Bed Mobility Overal bed mobility: Modified Independent Bed Mobility: Supine to Sit;Sit to Supine     Supine to sit: Modified independent (Device/Increase time) Sit to supine: Modified independent (Device/Increase time)   General bed mobility comments: increased time, HOB elevated    Transfers Overall transfer level: Needs assistance Equipment used: Left platform walker Transfers: Sit to/from Stand Sit to Stand: Mod assist;Max assist;From elevated surface         General transfer comment: increased time, cues for hand placement/sequence with use of platform RW.    Balance Overall balance assessment: Needs assistance Sitting-balance support: Single extremity supported Sitting balance-Leahy Scale: Good Sitting balance - Comments: G static sitting     Standing balance-Leahy Scale: Poor Standing balance comment: requires external support as well as UE support with platform RW to sustain static stand.                           ADL either performed or assessed with clinical judgement   ADL Overall ADL's : Needs assistance/impaired Eating/Feeding: Set up;Modified independent;Sitting   Grooming: Wash/dry face;Oral care;Set up;Sitting       Lower Body Bathing: Minimal assistance;Bed level Lower Body Bathing Details (indicate cue type and reason): bed level, high fowler's, knee/hip flexion method to don sock to R LE.  Vision Patient Visual Report: No change from baseline     Perception     Praxis      Cognition Arousal/Alertness: Awake/alert Behavior During Therapy: WFL for tasks assessed/performed Overall Cognitive Status:  Within Functional Limits for tasks assessed                                 General Comments: A&O x 4, aware of NWB precautions        Exercises Other Exercises Other Exercises: OT engaes pt in ed re: updating OT POC, importance of increasing OOB activity tolerance, increasing standing time and instances, etc. Pt with good reception, generally more compliant this date.   Shoulder Instructions       General Comments      Pertinent Vitals/ Pain       Pain Assessment: Faces Faces Pain Scale: Hurts a little bit Pain Location: generally c/o being a little sore in LEs from exercise earlier this date Pain Descriptors / Indicators: Sore Pain Intervention(s): Monitored during session  Home Living                                          Prior Functioning/Environment              Frequency  Min 1X/week        Progress Toward Goals  OT Goals(current goals can now be found in the care plan section)  Progress towards OT goals: Progressing toward goals (some goals updated to reflect progress, some continued to allow more time for pt to progress. POC length 1 month)  Acute Rehab OT Goals Patient Stated Goal: to get stronger OT Goal Formulation: With patient Time For Goal Achievement: 07/31/21 Potential to Achieve Goals: Good ADL Goals Pt Will Perform Grooming: with modified independence;sitting (tolerting ~8-10 mins sitting, performing ~2-3 g/h tasks to increase OOB activity tolerance.) Pt Will Perform Upper Body Dressing: with modified independence;sitting  Plan Frequency remains appropriate;Discharge plan remains appropriate    Co-evaluation                 AM-PAC OT "6 Clicks" Daily Activity     Outcome Measure   Help from another person eating meals?: None Help from another person taking care of personal grooming?: A Little Help from another person toileting, which includes using toliet, bedpan, or urinal?: A Lot Help from  another person bathing (including washing, rinsing, drying)?: A Little Help from another person to put on and taking off regular upper body clothing?: A Little Help from another person to put on and taking off regular lower body clothing?: A Lot 6 Click Score: 17    End of Session Equipment Utilized During Treatment: Other (comment) (L platform RW)  OT Visit Diagnosis: Unsteadiness on feet (R26.81);Muscle weakness (generalized) (M62.81);Adult, failure to thrive (R62.7) Pain - Right/Left: Left Pain - part of body: Leg;Arm;Hand   Activity Tolerance Patient tolerated treatment well   Patient Left in bed;with call bell/phone within reach;with bed alarm set   Nurse Communication Mobility status        Time: 1610-9604 OT Time Calculation (min): 31 min  Charges: OT General Charges $OT Visit: 1 Visit OT Treatments $Self Care/Home Management : 8-22 mins $Therapeutic Activity: 8-22 mins  Gerrianne Scale, [place], OTR/L ascom [phone] 07/01/21, 5:53 PM

## 2021-07-01 NOTE — Progress Notes (Signed)
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  Pt recently here for multiple months with difficult d/c planning, did ultimatley d/c to homeless shelter and was gone only a few hours before fall with L ankle and wrist fractures with return to the ED.    PT Comments    Pt refuses standing trials x 3 during session.  Upon arrival he stated he stood with nursing students for linen change.  He does agree to supine ex 2 x 10.  Again approached pt to stand.  "I told you I just finished standing with them"  Encouraged sitting EOB and he again declined.     Recommendations for follow up therapy are one component of a multi-disciplinary discharge planning process, led by the attending physician.  Recommendations may be updated based on patient status, additional functional criteria and insurance authorization.  Follow Up Recommendations  SNF;Supervision/Assistance - 24 hour     Equipment Recommendations       Recommendations for Other Services       Precautions / Restrictions Precautions Precautions: Fall Restrictions Weight Bearing Restrictions: Yes LUE Weight Bearing: Non weight bearing LLE Weight Bearing: Non weight bearing Other Position/Activity Restrictions: NWB LUE/LLE, LUE platform walker in room     Mobility  Bed Mobility               General bed mobility comments: refused stating he was up with nursing    Transfers                    Ambulation/Gait                 Stairs             Wheelchair Mobility    Modified Rankin (Stroke Patients Only)       Balance                                             Cognition Arousal/Alertness: Awake/alert Behavior During Therapy: WFL for tasks assessed/performed Overall Cognitive Status: Within Functional Limits for tasks assessed                                        Exercises Other Exercises Other Exercises: BLE ex  2 x 10 in supine    General Comments        Pertinent Vitals/Pain Pain Assessment: No/denies pain Pain Intervention(s): Monitored during session    Home Living                      Prior Function            PT Goals (current goals can now be found in the care plan section) Progress towards PT goals: Progressing toward goals    Frequency    Min 2X/week      PT Plan Current plan remains appropriate    Co-evaluation              AM-PAC PT "  6 Clicks" Mobility   Outcome Measure  Help needed turning from your back to your side while in a flat bed without using bedrails?: A Little Help needed moving from lying on your back to sitting on the side of a flat bed without using bedrails?: A Little Help needed moving to and from a bed to a chair (including a wheelchair)?: A Lot Help needed standing up from a chair using your arms (e.g., wheelchair or bedside chair)?: A Lot Help needed to walk in hospital room?: Total Help needed climbing 3-5 steps with a railing? : Total 6 Click Score: 12    End of Session   Activity Tolerance: Patient tolerated treatment well Patient left: in bed;with call bell/phone within reach;with bed alarm set Nurse Communication: Mobility status PT Visit Diagnosis: Unsteadiness on feet (R26.81);Muscle weakness (generalized) (M62.81);Difficulty in walking, not elsewhere classified (R26.2);History of falling (Z91.81) Pain - Right/Left: Left Pain - part of body: Ankle and joints of foot     Time: 1740-9927 PT Time Calculation (min) (ACUTE ONLY): 8 min  Charges:  $Therapeutic Exercise: 8-22 mins                    [name], PTA 07/01/21,  2:39 PM

## 2021-07-01 NOTE — Progress Notes (Signed)
PROGRESS NOTE    Ryan [name]  YCX:[id number] DOB: [birth year] DOA: 06/02/2021 PCP: Pcp, No    Brief Narrative:  60 y.o. 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   Patient was recently hospitalized from 6/21-8/24 due to metastasized prostate cancer. Pt was seen by [name] of oncology. He got his last prostate cancer treatment yesterday. He was just discharged from the hospital yesterday. He was supposed to go to a homeless shelter but apparently there were issues when he was transported there. He sates that he he felt dizzy and fell accidentally. No LOC. He injured his left wrist and left ankle, causing severe pain in left ankle and left wrist.     Left wrist x-ray with fracture of distal radius and ulna.   CT left ankle with impacted distal tibial and fibular fractures, mild posterior displacement and angulation.  Orthopedic was consulted and they are recommending conservative management based on his other comorbidities and advanced malignancy.  Patient will be nonweightbearing her left lower extremity.   Therapy is recommended skilled nursing facility.  Medicaid application is in progress.  TOC working on placement.  As of 9/20 no bed offers   Assessment & Plan:   Principal Problem:   Closed left ankle fracture Active Problems:   Fall   Anemia of chronic disease   Protein-calorie malnutrition, severe (HCC)   Cancer cachexia (HCC)   Prostate cancer metastatic to bone (HCC)   Left wrist fracture, closed, initial encounter   Hypocalcemia   Hyperkalemia   Closed fracture of left distal radius  Closed left ankle fracture and left wrist fracture, closed, consulted [name] of ortho, given patient's metastasized prostate cancer, recommended conservative treatment.  Patient will be nonweightbearing on left lower extremity and follow-up with  orthopedic as an outpatient. Plan: As needed pain control Continue therapy evaluations TOC looking for placement, difficult to place Disability remains pending Senior case management involved in dispo planning   Anemia of chronic disease Hemoglobin low but stable, no obvious bleeding Plan: Transfuse PRBC as needed hemoglobin less than 7     Hyperkalemia.  Resolved. --weekly labs to monitor   Severe protein caloric malnutrition/cancer cachexia. --gained weight, eating well now. --noted that pt has been on decadron 4 mg daily for a long time, presumably for appetite stimulation. --Steroids discontinued after 9/13 Plan: Continue with Ensure Appetite improved Nutrition follow-up    Hypotension -Review of previous discharge summary show that patient was discharged with midodrine -Continue 5 mg p.o. 3 times daily   Prostate cancer metastatic to bone (HCC) Consulted [name].   Per [name], Pt was seen in hospital in previous [place].  --cont monthly prostate cancer treatment   Hypocalcemia.   Corrected calcium of 6.9.  Received total of 3 g of calcium gluconate IV. Vitamin D levels more than 200. -Continue with calcium supplement   Hyponatremia -Started on saline infusion -Recheck in a.m.   Anxiety and depression. --cont Remeron --cont Atarax PRN   Insomnia -- Continue trazodone 150 mg nightly   DVT prophylaxis: Xarelto Code Status: DNR Family Communication: None Disposition Plan: Status is: Inpatient  Remains inpatient appropriate because:Unsafe d/c plan  Dispo: The patient is from: Home              Anticipated d/c is to:  TBD              Patient currently  is medically stable to d/c.   Difficult to place patient Yes       Level of care: Med-Surg  Consultants:  None  Procedures:  None  Antimicrobials:  None   Subjective: Seen and examined.  Sitting in bed eating.  No visible distress.  No complaints.  Objective: Vitals:    06/30/21 2030 07/01/21 0432 07/01/21 0740 07/01/21 0800  BP: 102/67 101/66 (!) 98/59 (!) 109/58  Pulse: (!) 101 (!) 108 (!) 103 (!) 106  Resp: 20 17 15    Temp: (!) 97 F (36.1 C) 98.6 F (37 C) 98 F (36.7 C)   TempSrc:   Temporal   SpO2: 97% 96% 96% 96%  Weight:      Height:        Intake/Output Summary (Last 24 hours) at 07/01/2021 1022 Last data filed at 07/01/2021 0500 Gross per 24 hour  Intake 0 ml  Output 1350 ml  Net -1350 ml   Filed Weights   06/02/21 1634 06/16/21 1000 06/23/21 1545  Weight: 52 kg 59.8 kg 62.6 kg    Examination:  General exam: No acute distress.  Appears frail and chronically ill Respiratory system: Lungs clear.  Normal work of breathing.  Room air Cardiovascular system: S1-S2, RRR, no murmurs, no pedal edema Gastrointestinal system: Thin/scaphoid, nontender, nondistended, normal bowel sounds Central nervous system: Alert and oriented. No focal neurological deficits. Extremities: Decreased power symmetrically Skin: No rashes, lesions or ulcers Psychiatry: Judgement and insight appear normal. Mood & affect appropriate.     Data Reviewed: I have personally reviewed following labs and imaging studies  CBC: Recent Labs  Lab 06/25/21 0518 06/29/21 2118  WBC 6.9 6.9  HGB 8.4* 8.1*  HCT 27.2* 25.7*  MCV 89.5 88.6  PLT 260 628   Basic Metabolic Panel: Recent Labs  Lab 06/25/21 0518 06/28/21 1022 06/30/21 0353  NA 133* 130* 135  K 5.0 4.0 4.9  CL 106 102 109  CO2 22 20* 21*  GLUCOSE 91 124* 93  BUN 21* 10 7  CREATININE 0.56* 0.56* 0.58*  CALCIUM 7.8* 7.7* 8.0*  MG 1.7  --  1.5*  PHOS 4.4  --  3.4   GFR: Estimated Creatinine Clearance: 88 mL/min (A) (by C-G formula based on SCr of 0.58 mg/dL (L)). Liver Function Tests: Recent Labs  Lab 06/25/21 0518 06/30/21 0353  ALBUMIN 3.4* 3.1*   No results for input(s): LIPASE, AMYLASE in the last 168 hours. No results for input(s): AMMONIA in the last 168 hours. Coagulation  Profile: No results for input(s): INR, PROTIME in the last 168 hours. Cardiac Enzymes: No results for input(s): CKTOTAL, CKMB, CKMBINDEX, TROPONINI in the last 168 hours. BNP (last 3 results) No results for input(s): PROBNP in the last 8760 hours. HbA1C: No results for input(s): HGBA1C in the last 72 hours. CBG: No results for input(s): GLUCAP in the last 168 hours. Lipid Profile: No results for input(s): CHOL, HDL, LDLCALC, TRIG, CHOLHDL, LDLDIRECT in the last 72 hours. Thyroid Function Tests: No results for input(s): TSH, T4TOTAL, FREET4, T3FREE, THYROIDAB in the last 72 hours. Anemia Panel: No results for input(s): VITAMINB12, FOLATE, FERRITIN, TIBC, IRON, RETICCTPCT in the last 72 hours. Sepsis Labs: No results for input(s): PROCALCITON, LATICACIDVEN in the last 168 hours.  No results found for this or any previous visit (from the past 240 hour(s)).       Radiology Studies: No results found.      Scheduled Meds:  calcium carbonate  1,250 mg Oral TID WC  feeding supplement  237 mL Oral QID   magnesium oxide  400 mg Oral Daily   midodrine  5 mg Oral TID WC   mirtazapine  15 mg Oral QHS   multivitamin with minerals  1 tablet Oral Daily   nicotine  21 mg Transdermal Daily   rivaroxaban  10 mg Oral Q supper   Continuous Infusions:  sodium chloride Stopped (06/04/21 1216)     LOS: 28 days    Time spent: 15 minutes    Sidney Ace, MD [facility] Pager [phone]  If 7PM-7AM, please contact night-coverage 07/01/2021, 10:22 AM

## 2021-07-02 ENCOUNTER — Other Ambulatory Visit: Payer: Self-pay | Admitting: Oncology

## 2021-07-02 LAB — PSA: Prostatic Specific Antigen: 184 ng/mL — ABNORMAL HIGH (ref 0.00–4.00)

## 2021-07-02 MED ORDER — LEUPROLIDE ACETATE (3 MONTH) 22.5 MG IM KIT: 22.5000 mg | PACK | Freq: Once | INTRAMUSCULAR | Status: DC

## 2021-07-02 MED ORDER — MAGNESIUM SULFATE 2 GM/50ML IV SOLN
2.0000 g | Freq: Once | INTRAVENOUS | Status: AC
  Administered 2021-07-02: 2 g via INTRAVENOUS
  Filled 2021-07-02: qty 50

## 2021-07-02 MED ORDER — LEUPROLIDE ACETATE (3 MONTH) 22.5 MG ~~LOC~~ KIT
22.5000 mg | PACK | Freq: Once | SUBCUTANEOUS | Status: AC
  Administered 2021-07-02: 22.5 mg via SUBCUTANEOUS
  Filled 2021-07-02: qty 22.5

## 2021-07-02 MED ORDER — ZOLEDRONIC ACID 4 MG/5ML IV CONC
4.0000 mg | Freq: Once | INTRAVENOUS | Status: AC
  Administered 2021-07-02: 4 mg via INTRAVENOUS
  Filled 2021-07-02: qty 5

## 2021-07-02 NOTE — Progress Notes (Signed)
PROGRESS NOTE    Ryan [name]  SWN:[id number] DOB: Jan 19, 1961 DOA: 06/02/2021 PCP: Pcp, No    Brief Narrative:  59 y.o. 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   Patient was recently hospitalized from 6/21-8/24 due to metastasized prostate cancer. Pt was seen by [name] of oncology. He got his last prostate cancer treatment yesterday. He was just discharged from the hospital yesterday. He was supposed to go to a homeless shelter but apparently there were issues when he was transported there. He sates that he he felt dizzy and fell accidentally. No LOC. He injured his left wrist and left ankle, causing severe pain in left ankle and left wrist.     Left wrist x-ray with fracture of distal radius and ulna.   CT left ankle with impacted distal tibial and fibular fractures, mild posterior displacement and angulation.  Orthopedic was consulted and they are recommending conservative management based on his other comorbidities and advanced malignancy.  Patient will be nonweightbearing her left lower extremity.   Therapy is recommended skilled nursing facility.  Medicaid application is in progress.  TOC working on placement.  As of 9/20 no bed offers   Assessment & Plan:   Principal Problem:   Closed left ankle fracture Active Problems:   Fall   Anemia of chronic disease   Protein-calorie malnutrition, severe (HCC)   Cancer cachexia (HCC)   Prostate cancer metastatic to bone (HCC)   Left wrist fracture, closed, initial encounter   Hypocalcemia   Hyperkalemia   Closed fracture of left distal radius  Closed left ankle fracture and left wrist fracture, closed, consulted [name] of ortho, given patient's metastasized prostate cancer, recommended conservative treatment.  Patient will be nonweightbearing on left lower extremity and follow-up with  orthopedic as an outpatient. Plan: As needed pain control Continue therapy evaluations TOC looking for placement, difficult to place Disability remains pending Senior case management involved in dispo planning   Anemia of chronic disease Hemoglobin low but stable, no obvious bleeding Plan: Transfuse PRBC as needed hemoglobin less than 7     Hyperkalemia.  Resolved. --weekly labs to monitor  Hypomagnesemia Likely secondary to malnutrition Monitor and replace as necessary Maintain Mg greater than 2   Severe protein caloric malnutrition/cancer cachexia. --gained weight, eating well now. --noted that pt has been on decadron 4 mg daily for a long time, presumably for appetite stimulation. --Steroids discontinued after 9/13 Plan: Continue with Ensure Appetite improved Nutrition follow-up    Hypotension -Review of previous discharge summary show that patient was discharged with midodrine -Continue 5 mg p.o. 3 times daily   Prostate cancer metastatic to bone (HCC) Consulted [name].   Per [name], Pt was seen in hospital in previous [place].  --cont monthly prostate cancer treatment   Hypocalcemia.   Corrected calcium of 6.9.  Received total of 3 g of calcium gluconate IV. Vitamin D levels more than 200. -Continue with calcium supplement   Hyponatremia -Started on saline infusion -Recheck in a.m.   Anxiety and depression. --cont Remeron --cont Atarax PRN   Insomnia -- Continue trazodone 150 mg nightly   DVT prophylaxis: Xarelto Code Status: DNR Family Communication: None Disposition Plan: Status is: Inpatient  Remains inpatient appropriate because:Unsafe d/c plan  Dispo: The patient is from: Home              Anticipated d/c is to:  TBD              Patient currently is medically stable to d/c.   Difficult to place patient Yes       Level of care: Med-Surg  Consultants:  None  Procedures:  None  Antimicrobials:   None   Subjective: Seen and examined.  Sitting in bed eating.  No visible distress.  No complaints.  Objective: Vitals:   07/02/21 0509 07/02/21 0809 07/02/21 0821 07/02/21 0909  BP: (!) 107/55 94/70 (!) 94/56 107/64  Pulse: (!) 106 (!) 108 (!) 108 (!) 108  Resp: 20 15  15   Temp: 98.1 F (36.7 C) (!) 97.2 F (36.2 C)  98.1 F (36.7 C)  TempSrc: Oral   Axillary  SpO2: 93% 96% 94% 97%  Weight:      Height:        Intake/Output Summary (Last 24 hours) at 07/02/2021 1049 Last data filed at 07/02/2021 0500 Gross per 24 hour  Intake 240 ml  Output 1200 ml  Net -960 ml   Filed Weights   06/02/21 1634 06/16/21 1000 06/23/21 1545  Weight: 52 kg 59.8 kg 62.6 kg    Examination:  General exam: No acute distress.  Appears frail and chronically ill Respiratory system: Lungs clear.  Normal work of breathing.  Room air Cardiovascular system: S1-S2, RRR, no murmurs, no pedal edema Gastrointestinal system: Thin/scaphoid, nontender, nondistended, normal bowel sounds Central nervous system: Alert and oriented. No focal neurological deficits. Extremities: Decreased power symmetrically Skin: No rashes, lesions or ulcers Psychiatry: Judgement and insight appear normal. Mood & affect appropriate.     Data Reviewed: I have personally reviewed following labs and imaging studies  CBC: Recent Labs  Lab 06/29/21 2118  WBC 6.9  HGB 8.1*  HCT 25.7*  MCV 88.6  PLT 062   Basic Metabolic Panel: Recent Labs  Lab 06/28/21 1022 06/30/21 0353  NA 130* 135  K 4.0 4.9  CL 102 109  CO2 20* 21*  GLUCOSE 124* 93  BUN 10 7  CREATININE 0.56* 0.58*  CALCIUM 7.7* 8.0*  MG  --  1.5*  PHOS  --  3.4   GFR: Estimated Creatinine Clearance: 88 mL/min (A) (by C-G formula based on SCr of 0.58 mg/dL (L)). Liver Function Tests: Recent Labs  Lab 06/30/21 0353  ALBUMIN 3.1*   No results for input(s): LIPASE, AMYLASE in the last 168 hours. No results for input(s): AMMONIA in the last 168  hours. Coagulation Profile: No results for input(s): INR, PROTIME in the last 168 hours. Cardiac Enzymes: No results for input(s): CKTOTAL, CKMB, CKMBINDEX, TROPONINI in the last 168 hours. BNP (last 3 results) No results for input(s): PROBNP in the last 8760 hours. HbA1C: No results for input(s): HGBA1C in the last 72 hours. CBG: No results for input(s): GLUCAP in the last 168 hours. Lipid Profile: No results for input(s): CHOL, HDL, LDLCALC, TRIG, CHOLHDL, LDLDIRECT in the last 72 hours. Thyroid Function Tests: No results for input(s): TSH, T4TOTAL, FREET4, T3FREE, THYROIDAB in the last 72 hours. Anemia Panel: No results for input(s): VITAMINB12, FOLATE, FERRITIN, TIBC, IRON, RETICCTPCT in the last 72 hours. Sepsis Labs: No results for input(s): PROCALCITON, LATICACIDVEN in the last 168 hours.  No results found for this or any previous visit (from the past 240 hour(s)).       Radiology Studies: No results found.      Scheduled Meds:  calcium carbonate  1,250 mg Oral TID WC   feeding supplement  237  mL Oral QID   Leuprolide Acetate (3 Month)  22.5 mg Subcutaneous Once   magnesium oxide  400 mg Oral Daily   midodrine  5 mg Oral TID WC   mirtazapine  15 mg Oral QHS   multivitamin with minerals  1 tablet Oral Daily   nicotine  21 mg Transdermal Daily   rivaroxaban  10 mg Oral Q supper   Continuous Infusions:  sodium chloride Stopped (06/04/21 1216)   magnesium sulfate bolus IVPB 2 g (07/02/21 0957)   zoledronic acid (ZOMETA) IV       LOS: 29 days    Time spent: 15 minutes    Sidney Ace, MD [facility] Pager [phone]  If 7PM-7AM, please contact night-coverage 07/02/2021, 10:49 AM

## 2021-07-02 NOTE — Plan of Care (Signed)
  Problem: Education: Goal: Knowledge of General Education information will improve Description: Including pain rating scale, medication(s)/side effects and non-pharmacologic comfort measures Outcome: Progressing   Problem: Health Behavior/Discharge Planning: Goal: Ability to manage health-related needs will improve Outcome: Progressing   Problem: Clinical Measurements: Goal: Ability to maintain clinical measurements within normal limits will improve Outcome: Progressing Goal: Will remain free from infection Outcome: Progressing Goal: Diagnostic test results will improve Outcome: Progressing Goal: Respiratory complications will improve Outcome: Progressing Goal: Cardiovascular complication will be avoided Outcome: Progressing   Problem: Activity: Goal: Risk for activity intolerance will decrease Outcome: Progressing   Problem: Nutrition: Goal: Adequate nutrition will be maintained Outcome: Progressing   Problem: Coping: Goal: Level of anxiety will decrease Outcome: Progressing   Problem: Elimination: Goal: Will not experience complications related to bowel motility Outcome: Progressing Goal: Will not experience complications related to urinary retention Outcome: Progressing   Problem: Pain Managment: Goal: General experience of comfort will improve Outcome: Progressing   Problem: Safety: Goal: Ability to remain free from injury will improve Outcome: Progressing   Problem: Skin Integrity: Goal: Risk for impaired skin integrity will decrease Outcome: Progressing   Problem: Education: Goal: Knowledge of the prescribed therapeutic regimen will improve Outcome: Progressing   Problem: Activity: Goal: Ability to increase mobility will improve Outcome: Progressing   Problem: Pain Management: Goal: Pain level will decrease with appropriate interventions Outcome: Progressing

## 2021-07-02 NOTE — Consult Note (Signed)
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  INTERVAL HISTORY: Patient is a 60 year old male with recently diagnosed prostate cancer.  He was in the hospital for an extended period of time secondary to placement issues.  Less than 24 hours after discharge he was readmitted about 1 month ago after a fall fracturing his leg and arm.  He is due for his next prostate cancer treatment today.  He currently feels well. He is not complaining of pain.  His appetite has improved and he is gaining weight.  He has no neurologic complaints.  He denies any recent fevers.  He has no chest pain, shortness of breath, cough, or hemoptysis.  He denies any nausea, vomiting, constipation, or diarrhea.  He has no urinary complaints.  Patient offers no specific complaints today.  REVIEW OF SYSTEMS:   Review of Systems  Constitutional: Negative.  Negative for fever, malaise/fatigue and weight loss.  Respiratory: Negative.  Negative for cough, hemoptysis and shortness of breath.   Cardiovascular: Negative.  Negative for chest pain and leg swelling.  Gastrointestinal: Negative.  Negative for abdominal pain.  Genitourinary: Negative.  Negative for dysuria.  Musculoskeletal: Negative.  Negative for back pain.  Skin: Negative.  Negative for rash.  Neurological: Negative.  Negative for dizziness, focal weakness, weakness and headaches.  Psychiatric/Behavioral: Negative.  The patient is not nervous/anxious.    As per HPI. Otherwise, a complete review of systems is negative.  PAST MEDICAL HISTORY: Past Medical History:  Diagnosis Date   Adenomatous colon polyp    Anemia    Depression    Elevated LFTs     PAST SURGICAL HISTORY: Past Surgical History:  Procedure Laterality Date   FRACTURE SURGERY      INTRAMEDULLARY (IM) NAIL INTERTROCHANTERIC Right 04/02/2020   Procedure: INTRAMEDULLARY (IM) NAIL INTERTROCHANTRIC;  Surgeon: Corky Mull, MD;  Location: [facility] ORS;  Service: Orthopedics;  Laterality: Right;    FAMILY HISTORY: Family History  Problem Relation Age of Onset   Heart disease Other    Colon polyps Cousin        maternal   Colon polyps Cousin    Colon polyps Maternal Aunt    Colon cancer Maternal Grandfather     ADVANCED DIRECTIVES (Y/N):  @ADVDIR @  HEALTH MAINTENANCE: Social History   Tobacco Use   Smoking status: Some Days   Smokeless tobacco: Never  Substance Use Topics   Alcohol use: Yes    Comment: 4 beers weekly   Drug use: No     Colonoscopy:  PAP:  Bone density:  Lipid panel:  No Known Allergies  Current Facility-Administered Medications  Medication Dose Route Frequency Provider Last Rate Last Admin   0.9 %  sodium chloride infusion   Intravenous PRN Lorella Nimrod, MD   Stopped at 06/04/21 1216   acetaminophen (TYLENOL) 160 MG/5ML solution 650 mg  650 mg Oral Q6H PRN Ivor Costa, MD       calcium carbonate (OS-CAL - dosed in mg of elemental calcium) tablet 1,250 mg  1,250 mg Oral TID WC Ivor Costa, MD   1,250 mg at 07/02/21 0952   feeding supplement (ENSURE ENLIVE / ENSURE PLUS) liquid 237 mL  237 mL Oral QID Lorella Nimrod, MD   237 mL at 07/01/21 2030   hydrOXYzine (ATARAX/VISTARIL) tablet 25  mg  25 mg Oral TID PRN Ivor Costa, MD   25 mg at 06/07/21 0545   leuprolide (LUPRON) injection 22.5 mg  22.5 mg Intramuscular Once Lloyd Huger, MD       magnesium oxide (MAG-OX) tablet 400 mg  400 mg Oral Daily Enzo Bi, MD   400 mg at 07/02/21 3419   magnesium sulfate IVPB 2 g 50 mL  2 g Intravenous Once Ralene Muskrat B, MD       midodrine (PROAMATINE) tablet 5 mg  5 mg Oral TID WC Kathie Dike, MD   5 mg at 07/02/21 0951   mirtazapine (REMERON) tablet 15 mg  15 mg Oral QHS Ivor Costa, MD   15 mg at 07/01/21 2030   multivitamin with minerals  tablet 1 tablet  1 tablet Oral Daily Lorella Nimrod, MD   1 tablet at 07/02/21 0951   nicotine (NICODERM CQ - dosed in mg/24 hours) patch 21 mg  21 mg Transdermal Daily Ivor Costa, MD   21 mg at 06/15/21 0857   ondansetron (ZOFRAN) injection 4 mg  4 mg Intravenous Q8H PRN Ivor Costa, MD   4 mg at 06/25/21 0556   ondansetron (ZOFRAN-ODT) disintegrating tablet 4 mg  4 mg Oral Q8H PRN Enzo Bi, MD       rivaroxaban Alveda Reasons) tablet 10 mg  10 mg Oral Q supper Rauer, Forde Dandy, RPH   10 mg at 07/01/21 1721   traMADol (ULTRAM) tablet 50 mg  50 mg Oral Q6H PRN Enzo Bi, MD   50 mg at 07/01/21 2337   traZODone (DESYREL) tablet 150 mg  150 mg Oral QHS PRN Ralene Muskrat B, MD   150 mg at 07/01/21 2336   zolendronic acid (ZOMETA) 4 mg in sodium chloride 0.9 % 100 mL IVPB  4 mg Intravenous Once Lloyd Huger, MD        OBJECTIVE: Vitals:   07/02/21 0821 07/02/21 0909  BP: (!) 94/56 107/64  Pulse: (!) 108 (!) 108  Resp:  15  Temp:  98.1 F (36.7 C)  SpO2: 94% 97%     Body mass index is 16.36 kg/m.    ECOG FS:1 - Symptomatic but completely ambulatory  General: Well-developed, well-nourished, no acute distress. Eyes: Pink conjunctiva, anicteric sclera. HEENT: Normocephalic, moist mucous membranes. Lungs: No audible wheezing or coughing. Heart: Regular rate and rhythm. Abdomen: Soft, nontender, no obvious distention. Musculoskeletal: No edema, cyanosis, or clubbing. Neuro: Alert, answering all questions appropriately. Cranial nerves grossly intact. Skin: No rashes or petechiae noted. Psych: Normal affect. Lymphatics: No cervical, calvicular, axillary or inguinal LAD.   LAB RESULTS:  Lab Results  Component Value Date   NA 135 06/30/2021   K 4.9 06/30/2021   CL 109 06/30/2021   CO2 21 (L) 06/30/2021   GLUCOSE 93 06/30/2021   BUN 7 06/30/2021   CREATININE 0.58 (L) 06/30/2021   CALCIUM 8.0 (L) 06/30/2021   PROT 5.7 (L) 06/04/2021   ALBUMIN 3.1 (L) 06/30/2021   AST 16  06/04/2021   ALT 17 06/04/2021   ALKPHOS 1,475 (H) 06/04/2021   BILITOT 0.8 06/04/2021   GFRNONAA >60 06/30/2021   GFRAA >60 04/06/2020    Lab Results  Component Value Date   WBC 6.9 06/29/2021   NEUTROABS 4.1 06/18/2021   HGB 8.1 (L) 06/29/2021   HCT 25.7 (L) 06/29/2021   MCV 88.6 06/29/2021   PLT 195 06/29/2021     STUDIES: DG Wrist Complete Left  Result Date: 06/02/2021 CLINICAL DATA:  Fall EXAM: LEFT WRIST - COMPLETE 3+ VIEW COMPARISON:  None. FINDINGS: Acute fracture distal radius and ulna. Impacted fracture on the dorsal surface of the distal radius does not appear to extend into the joint. Nondisplaced fracture distal ulna. Wrist joint appears normal. No carpal fracture. IMPRESSION: Fracture distal radius and ulna. Electronically Signed   By: Franchot Gallo M.D.   On: 06/02/2021 17:15   DG Ankle 2 Views Left  Result Date: 06/02/2021 CLINICAL DATA:  Fall EXAM: LEFT ANKLE - 2 VIEW COMPARISON:  04/23/2021 FINDINGS: Fracture noted in the distal tibial and fibular metaphyses, appears subacute, but was not present on prior study. No subluxation or dislocation. Diffuse soft tissue swelling. IMPRESSION: Distal tibial and fibular metaphyseal fractures, acute versus subacute. Electronically Signed   By: Rolm Baptise M.D.   On: 06/02/2021 20:50   CT HEAD WO CONTRAST (5MM)  Result Date: 06/03/2021 CLINICAL DATA:  Dizziness leading to a fall. Head trauma, minor, normal mental status. EXAM: CT HEAD WITHOUT CONTRAST TECHNIQUE: Contiguous axial images were obtained from the base of the skull through the vertex without intravenous contrast. COMPARISON:  MRI 04/03/2021 FINDINGS: Brain: Moderate generalized volume loss of the cerebral hemispheres. More advanced generalized cerebellar atrophy. No evidence of small-vessel disease by CT. No sign of acute infarction, mass lesion, hemorrhage, hydrocephalus or extra-axial collection. Small abnormalities described at MRI will be followed up with  subsequent MRI examinations. Vascular: There is atherosclerotic calcification of the major vessels at the base of the brain. Skull: Abnormal appearance of the bones diffusely felt previously to represent osseous metastatic disease. Widespread Paget's disease could have a similar appearance. No lytic lesion. Sinuses/Orbits: Clear/normal Other: None IMPRESSION: No acute finding by CT. Cerebellar more than cerebral atrophy. No acute or focal brain finding. Abnormal appearance of the calvarium, skull base and upper cervical spine. Previously this was felt to represent diffuse osseous metastatic disease by MRI. Electronically Signed   By: Nelson Chimes M.D.   On: 06/03/2021 17:52   CT Ankle Left Wo Contrast  Result Date: 06/03/2021 CLINICAL DATA:  Ankle pain, no prior imaging EXAM: CT OF THE LEFT ANKLE WITHOUT CONTRAST TECHNIQUE: Multidetector CT imaging of the left ankle was performed according to the standard protocol. Multiplanar CT image reconstructions were also generated. COMPARISON:  Foot radiograph 04/23/2021 FINDINGS: Bones/Joint/Cartilage Diffuse osteopenia. There is an impacted distal tibial metaphyseal fracture with mild posterior displacement and angulation. This does not extend to the tibiotalar joint. There is also an impacted distal fibular fracture with minimal posterior displacement and angulation. These fractures are transversely oriented. Ligaments Suboptimally assessed by CT. Muscles and Tendons The significant muscle atrophy. There is no evidence of tendon entrapment. Soft tissues Diffuse soft tissue swelling of the ankle. IMPRESSION: Impacted distal tibial and fibular fractures, with mild posterior displacement and angulation. No evidence of extension to the tibiotalar joint. Diffuse osteopenia.  Diffuse soft tissue swelling of the ankle. Electronically Signed   By: Maurine Simmering M.D.   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  PLAN:    Stage  IV prostate cancer with widespread bony disease: Previously, patient's PSA decreased from greater than 3000 down to 421.  Repeat laboratory work from today is pending.  He received his last treatment for his prostate cancer with Mills Koller and Zometa approximately 1 month ago.  Will switch treatment to Lupron which is a Depo injection that can be given every 3 months.  Patient also received his monthly infusion  of Zometa today.  No further intervention is needed at this time.  Patient's next treatment with Zometa will occur in approximately 4 weeks.  If patient is discharged, he can follow-up as an outpatient. Bony disease: Patient's most recent calcium level is 8.0, but this corrects up to near normal given his decreased albumin.  Proceed with Zometa as above. Anemia: Chronic and unchanged.  Patient's most recent hemoglobin is 8.1. Fractures: Continue evaluation and treatment per orthopedics. Disposition: No further intervention needed by oncology as inpatient.  Patient will be due for his next treatment of Zometa in 1 month as above.  Call with questions.   Lloyd Huger, MD   07/02/2021 9:55 AM

## 2021-07-03 NOTE — Progress Notes (Signed)
PROGRESS NOTE    Ryan [name]  WSF:[id number] DOB: 1961-09-08 DOA: 06/02/2021 PCP: Pcp, No    Brief Narrative:  60 y.o. 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   Patient was recently hospitalized from 6/21-8/24 due to metastasized prostate cancer. Pt was seen by [name] of oncology. He got his last prostate cancer treatment yesterday. He was just discharged from the hospital yesterday. He was supposed to go to a homeless shelter but apparently there were issues when he was transported there. He sates that he he felt dizzy and fell accidentally. No LOC. He injured his left wrist and left ankle, causing severe pain in left ankle and left wrist.     Left wrist x-ray with fracture of distal radius and ulna.   CT left ankle with impacted distal tibial and fibular fractures, mild posterior displacement and angulation.  Orthopedic was consulted and they are recommending conservative management based on his other comorbidities and advanced malignancy.  Patient will be nonweightbearing her left lower extremity.   Therapy is recommended skilled nursing facility.  Medicaid application is in progress.  TOC working on placement.  As of 9/20 no bed offers   Assessment & Plan:   Principal Problem:   Closed left ankle fracture Active Problems:   Fall   Anemia of chronic disease   Protein-calorie malnutrition, severe (HCC)   Cancer cachexia (HCC)   Prostate cancer metastatic to bone (HCC)   Left wrist fracture, closed, initial encounter   Hypocalcemia   Hyperkalemia   Closed fracture of left distal radius  Closed left ankle fracture and left wrist fracture, closed, consulted [name] of ortho, given patient's metastasized prostate cancer, recommended conservative treatment.  Patient will be nonweightbearing on left lower extremity and follow-up with  orthopedic as an outpatient. Plan: As needed pain control Continue therapy evaluations TOC looking for placement, difficult to place Disability remains pending Senior case management involved in dispo planning   Anemia of chronic disease Hemoglobin low but stable, no obvious bleeding Plan: Transfuse PRBC as needed hemoglobin less than 7     Hyperkalemia.  Resolved. --weekly labs to monitor  Hypomagnesemia Likely secondary to malnutrition Monitor and replace as necessary Maintain Mg greater than 2   Severe protein caloric malnutrition/cancer cachexia. --gained weight, eating well now. --noted that pt has been on decadron 4 mg daily for a long time, presumably for appetite stimulation. --Steroids discontinued after 9/13 Plan: Continue with Ensure Appetite improved Nutrition follow-up    Hypotension -Review of previous discharge summary show that patient was discharged with midodrine -Continue 5 mg p.o. 3 times daily - Blood pressure remains soft but stable, can consider stopping this medication at or near discharge   Prostate cancer metastatic to bone [facility]) Consulted [name].   Per [name], Pt was seen in hospital in previous [place].  --cont monthly prostate cancer treatment   Hypocalcemia.   Corrected calcium of 6.9.  Received total of 3 g of calcium gluconate IV. Vitamin D levels more than 200. -Continue with calcium supplement   Hyponatremia -Started on saline infusion -Recheck in a.m.   Anxiety and depression. --cont Remeron --cont Atarax PRN   Insomnia -- Continue trazodone 150 mg nightly   DVT prophylaxis: Xarelto Code Status: DNR Family Communication: None Disposition Plan: Status is: Inpatient  Remains inpatient appropriate because:Unsafe d/c plan  Dispo: The patient is from: Home  Anticipated d/c is to:  TBD              Patient currently is medically stable to d/c.   Difficult to place patient Yes       Level  of care: Med-Surg  Consultants:  None  Procedures:  None  Antimicrobials:  None   Subjective: Seen and examined.  Sitting in bed eating.  No visible distress.  No complaints.  Objective: Vitals:   07/02/21 2346 07/03/21 0050 07/03/21 0559 07/03/21 0743  BP: 100/60 (!) 97/55 (!) 102/56 (!) 96/59  Pulse: (!) 111 98 98 97  Resp: 16  17 16   Temp: 98.2 F (36.8 C) 98.2 F (36.8 C)  97.9 F (36.6 C)  TempSrc:  Oral    SpO2: 98% 95% 95% 96%  Weight:      Height:        Intake/Output Summary (Last 24 hours) at 07/03/2021 1101 Last data filed at 07/03/2021 1001 Gross per 24 hour  Intake 240 ml  Output 800 ml  Net -560 ml   Filed Weights   06/02/21 1634 06/16/21 1000 06/23/21 1545  Weight: 52 kg 59.8 kg 62.6 kg    Examination:  General exam: No acute distress.  Appears frail and chronically ill Respiratory system: Lungs clear.  Normal work of breathing.  Room air Cardiovascular system: S1-S2, RRR, no murmurs, no pedal edema Gastrointestinal system: Thin/scaphoid, nontender, nondistended, normal bowel sounds Central nervous system: Alert and oriented. No focal neurological deficits. Extremities: Decreased power symmetrically Skin: No rashes, lesions or ulcers Psychiatry: Judgement and insight appear normal. Mood & affect appropriate.     Data Reviewed: I have personally reviewed following labs and imaging studies  CBC: Recent Labs  Lab 06/29/21 2118  WBC 6.9  HGB 8.1*  HCT 25.7*  MCV 88.6  PLT 790   Basic Metabolic Panel: Recent Labs  Lab 06/28/21 1022 06/30/21 0353  NA 130* 135  K 4.0 4.9  CL 102 109  CO2 20* 21*  GLUCOSE 124* 93  BUN 10 7  CREATININE 0.56* 0.58*  CALCIUM 7.7* 8.0*  MG  --  1.5*  PHOS  --  3.4   GFR: Estimated Creatinine Clearance: 88 mL/min (A) (by C-G formula based on SCr of 0.58 mg/dL (L)). Liver Function Tests: Recent Labs  Lab 06/30/21 0353  ALBUMIN 3.1*   No results for input(s): LIPASE, AMYLASE in the last 168  hours. No results for input(s): AMMONIA in the last 168 hours. Coagulation Profile: No results for input(s): INR, PROTIME in the last 168 hours. Cardiac Enzymes: No results for input(s): CKTOTAL, CKMB, CKMBINDEX, TROPONINI in the last 168 hours. BNP (last 3 results) No results for input(s): PROBNP in the last 8760 hours. HbA1C: No results for input(s): HGBA1C in the last 72 hours. CBG: No results for input(s): GLUCAP in the last 168 hours. Lipid Profile: No results for input(s): CHOL, HDL, LDLCALC, TRIG, CHOLHDL, LDLDIRECT in the last 72 hours. Thyroid Function Tests: No results for input(s): TSH, T4TOTAL, FREET4, T3FREE, THYROIDAB in the last 72 hours. Anemia Panel: No results for input(s): VITAMINB12, FOLATE, FERRITIN, TIBC, IRON, RETICCTPCT in the last 72 hours. Sepsis Labs: No results for input(s): PROCALCITON, LATICACIDVEN in the last 168 hours.  No results found for this or any previous visit (from the past 240 hour(s)).       Radiology Studies: No results found.      Scheduled Meds:  calcium carbonate  1,250 mg Oral TID WC   feeding supplement  237 mL Oral QID   magnesium oxide  400 mg Oral Daily   midodrine  5 mg Oral TID WC   mirtazapine  15 mg Oral QHS   multivitamin with minerals  1 tablet Oral Daily   nicotine  21 mg Transdermal Daily   rivaroxaban  10 mg Oral Q supper   Continuous Infusions:  sodium chloride Stopped (06/04/21 1216)     LOS: 30 days    Time spent: 15 minutes    Sidney Ace, MD [facility] Pager [phone]  If 7PM-7AM, please contact night-coverage 07/03/2021, 11:01 AM

## 2021-07-03 NOTE — Progress Notes (Signed)
   07/02/21 2346  Assess: MEWS Score  Temp 98.2 F (36.8 C)  BP 100/60  Pulse Rate (!) 111  Resp 16  SpO2 98 %  O2 Device Room Air  Assess: MEWS Score  MEWS Temp 0  MEWS Systolic 1  MEWS Pulse 2  MEWS RR 0  MEWS LOC 0  MEWS Score 3  MEWS Score Color Yellow  Assess: if the MEWS score is Yellow or Red  Were vital signs taken at a resting state? Yes  Focused Assessment No change from prior assessment  Does the patient meet 2 or more of the SIRS criteria? No  Does the patient have a confirmed or suspected source of infection? No  MEWS guidelines implemented *See Row Information* No, vital signs rechecked  Document  Patient Outcome Other (Comment) (MEWS resolved after re-taking VS)  Assess: SIRS CRITERIA  SIRS Temperature  0  SIRS Pulse 1  SIRS Respirations  0  SIRS WBC 0  SIRS Score Sum  1  Pt VS stabilized within the hour.

## 2021-07-04 ENCOUNTER — Inpatient Hospital Stay: Payer: Medicaid Other

## 2021-07-04 LAB — GLUCOSE, CAPILLARY: Glucose-Capillary: 113 mg/dL — ABNORMAL HIGH (ref 70–99)

## 2021-07-04 MED ORDER — SENNOSIDES-DOCUSATE SODIUM 8.6-50 MG PO TABS
1.0000 | ORAL_TABLET | Freq: Two times a day (BID) | ORAL | Status: DC
  Administered 2021-07-04 – 2021-08-29 (×23): 1 via ORAL
  Filled 2021-07-04 (×73): qty 1

## 2021-07-04 NOTE — Progress Notes (Signed)
No adverse events during shift. Vitals stable, no respiratory distress on room air. Will continue to monitor.

## 2021-07-04 NOTE — Progress Notes (Signed)
PROGRESS NOTE    Ryan [name]  WUJ:[id number] DOB: [birth year] DOA: 06/02/2021 PCP: Pcp, No    Brief Narrative:  60 y.o. 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   Patient was recently hospitalized from 6/21-8/24 due to metastasized prostate cancer. Pt was seen by [name] of oncology. He got his last prostate cancer treatment yesterday. He was just discharged from the hospital yesterday. He was supposed to go to a homeless shelter but apparently there were issues when he was transported there. He sates that he he felt dizzy and fell accidentally. No LOC. He injured his left wrist and left ankle, causing severe pain in left ankle and left wrist.     Left wrist x-ray with fracture of distal radius and ulna.   CT left ankle with impacted distal tibial and fibular fractures, mild posterior displacement and angulation.  Orthopedic was consulted and they are recommending conservative management based on his other comorbidities and advanced malignancy.  Patient will be nonweightbearing her left lower extremity.   Therapy is recommended skilled nursing facility.  Medicaid application is in progress.  TOC working on placement.  As of 9/20 no bed offers   Assessment & Plan:   Principal Problem:   Closed left ankle fracture Active Problems:   Fall   Anemia of chronic disease   Protein-calorie malnutrition, severe (HCC)   Cancer cachexia (HCC)   Prostate cancer metastatic to bone (HCC)   Left wrist fracture, closed, initial encounter   Hypocalcemia   Hyperkalemia   Closed fracture of left distal radius  Closed left ankle fracture and left wrist fracture, closed, consulted [name] of ortho, given patient's metastasized prostate cancer, recommended conservative treatment.  Patient will be nonweightbearing on left lower extremity and follow-up with  orthopedic as an outpatient. Plan: As needed pain control Continue therapy evaluations TOC looking for placement, difficult to place Disability remains pending Senior case management involved in dispo planning   Anemia of chronic disease Hemoglobin low but stable, no obvious bleeding Plan: Transfuse PRBC as needed hemoglobin less than 7     Hyperkalemia.  Resolved. --weekly labs to monitor  Hypomagnesemia Likely secondary to malnutrition Monitor and replace as necessary Maintain Mg greater than 2   Severe protein caloric malnutrition/cancer cachexia. --gained weight, eating well now. --noted that pt has been on decadron 4 mg daily for a long time, presumably for appetite stimulation. --Steroids discontinued after 9/13 Plan: Continue with Ensure Appetite improved Nutrition follow-up    Hypotension -Review of previous discharge summary show that patient was discharged with midodrine -Continue 5 mg p.o. 3 times daily - Blood pressure remains soft but stable, can consider stopping this medication at or near discharge   Prostate cancer metastatic to bone [facility]) Consulted [name].   Per [name], Pt was seen in hospital in previous [place].  Appreciate oncology follow-up Consultant service will dictate future inpatient chemotherapy   Hypocalcemia.   Corrected calcium of 6.9.  Received total of 3 g of calcium gluconate IV. Vitamin D levels more than 200. -Continue with calcium supplement   Hyponatremia -Started on saline infusion -Recheck in a.m.   Anxiety and depression. --cont Remeron --cont Atarax PRN   Insomnia -- Continue trazodone 150 mg nightly   DVT prophylaxis: Xarelto Code Status: DNR Family Communication: None Disposition Plan: Status is: Inpatient  Remains inpatient appropriate because:Unsafe d/c plan  Dispo: The patient  is from: Home              Anticipated d/c is to:  TBD              Patient currently is medically stable to  d/c.   Difficult to place patient Yes       Level of care: Med-Surg  Consultants:  None  Procedures:  None  Antimicrobials:  None   Subjective: Seen and examined.  Sitting in bed eating.  No visible distress.  No complaints.  Objective: Vitals:   07/04/21 0100 07/04/21 0200 07/04/21 0448 07/04/21 0756  BP: (!) 94/58 92/65 (!) 104/59 (!) 97/55  Pulse: (!) 105 100 (!) 104 96  Resp: 16 16 16 16   Temp: 98.4 F (36.9 C) 98.2 F (36.8 C) 98.2 F (36.8 C) 98.6 F (37 C)  TempSrc:      SpO2: 98% 96% 96% 95%  Weight:      Height:        Intake/Output Summary (Last 24 hours) at 07/04/2021 1033 Last data filed at 07/04/2021 1030 Gross per 24 hour  Intake 240 ml  Output 500 ml  Net -260 ml   Filed Weights   06/02/21 1634 06/16/21 1000 06/23/21 1545  Weight: 52 kg 59.8 kg 62.6 kg    Examination:  General exam: No acute distress.  Appears frail and chronically ill Respiratory system: Lungs clear.  Normal work of breathing.  Room air Cardiovascular system: S1-S2, RRR, no murmurs, no pedal edema Gastrointestinal system: Thin/scaphoid, nontender, nondistended, normal bowel sounds Central nervous system: Alert and oriented. No focal neurological deficits. Extremities: Decreased power symmetrically Skin: No rashes, lesions or ulcers Psychiatry: Judgement and insight appear normal. Mood & affect appropriate.     Data Reviewed: I have personally reviewed following labs and imaging studies  CBC: Recent Labs  Lab 06/29/21 2118  WBC 6.9  HGB 8.1*  HCT 25.7*  MCV 88.6  PLT 270   Basic Metabolic Panel: Recent Labs  Lab 06/28/21 1022 06/30/21 0353  NA 130* 135  K 4.0 4.9  CL 102 109  CO2 20* 21*  GLUCOSE 124* 93  BUN 10 7  CREATININE 0.56* 0.58*  CALCIUM 7.7* 8.0*  MG  --  1.5*  PHOS  --  3.4   GFR: Estimated Creatinine Clearance: 88 mL/min (A) (by C-G formula based on SCr of 0.58 mg/dL (L)). Liver Function Tests: Recent Labs  Lab 06/30/21 0353   ALBUMIN 3.1*   No results for input(s): LIPASE, AMYLASE in the last 168 hours. No results for input(s): AMMONIA in the last 168 hours. Coagulation Profile: No results for input(s): INR, PROTIME in the last 168 hours. Cardiac Enzymes: No results for input(s): CKTOTAL, CKMB, CKMBINDEX, TROPONINI in the last 168 hours. BNP (last 3 results) No results for input(s): PROBNP in the last 8760 hours. HbA1C: No results for input(s): HGBA1C in the last 72 hours. CBG: No results for input(s): GLUCAP in the last 168 hours. Lipid Profile: No results for input(s): CHOL, HDL, LDLCALC, TRIG, CHOLHDL, LDLDIRECT in the last 72 hours. Thyroid Function Tests: No results for input(s): TSH, T4TOTAL, FREET4, T3FREE, THYROIDAB in the last 72 hours. Anemia Panel: No results for input(s): VITAMINB12, FOLATE, FERRITIN, TIBC, IRON, RETICCTPCT in the last 72 hours. Sepsis Labs: No results for input(s): PROCALCITON, LATICACIDVEN in the last 168 hours.  No results found for this or any previous visit (from the past 240 hour(s)).       Radiology Studies: No results found.  Scheduled Meds:  calcium carbonate  1,250 mg Oral TID WC   feeding supplement  237 mL Oral QID   magnesium oxide  400 mg Oral Daily   midodrine  5 mg Oral TID WC   mirtazapine  15 mg Oral QHS   multivitamin with minerals  1 tablet Oral Daily   nicotine  21 mg Transdermal Daily   rivaroxaban  10 mg Oral Q supper   senna-docusate  1 tablet Oral BID   Continuous Infusions:  sodium chloride Stopped (06/04/21 1216)     LOS: 31 days    Time spent: 15 minutes    Sidney Ace, MD [facility] Pager [phone]  If 7PM-7AM, please contact night-coverage 07/04/2021, 10:33 AM

## 2021-07-05 ENCOUNTER — Other Ambulatory Visit: Payer: Self-pay

## 2021-07-05 NOTE — Progress Notes (Signed)
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  Pt recently here for multiple months with difficult d/c planning, did ultimatley d/c to homeless shelter and was gone only a few hours before fall with L ankle and wrist fractures with return to the ED.   OT comments  Pt seen for OT tx this date. Pt endorsing mild L wrist discomfort/pain but agreeable to participate. Pt requested briefs prior to sitting EOB. With set up of mesh briefs, pt able to don from bed level using RUE, no direct assist required. Sueprvision for bed mobility. Once seated EOB pt performed grooming tasks with set up and supervision for safety. PRN VC for L side NWBing. Pt endorsed significant fatigue afterwards. Pt progressing towards goals, however, continues to benefit from skilled OT services and continue to recommend SNF for STR at discharge to maximize safety/indep.    Recommendations for follow up therapy are one component of a multi-disciplinary discharge planning process, led by the attending physician.  Recommendations may be updated based on patient status, additional functional criteria and insurance authorization.    Follow Up Recommendations  SNF    Equipment Recommendations  None recommended by OT    Recommendations for Other Services      Precautions / Restrictions Precautions Precautions: Fall Precaution Comments: NWB LUE/LLE Required Braces or Orthoses: Splint/Cast Spinal Brace Comments: pt no longer using TLSO brace Splint/Cast: L ankle/LUE Restrictions Weight Bearing Restrictions: Yes LUE Weight Bearing: Non weight bearing LLE Weight Bearing: Non weight bearing        Mobility Bed Mobility Overal bed mobility: Needs Assistance Bed Mobility: Supine to Sit;Sit to Supine     Supine to sit: Modified independent (Device/Increase time) Sit to supine: Modified independent (Device/Increase time)   General bed mobility comments: Pt requiring minA with scooting up towards [facility] with usage of bed linens. Pt able to participate with cues for usage of R UE and R LE. Pt required cues to not use his L LE to assist with scooting towards HOB.    Transfers                 General transfer comment: Pt declining any OOB mobility    Balance Overall balance assessment: Needs assistance Sitting-balance support: No upper extremity supported Sitting balance-Leahy Scale: Good                                     ADL either performed or assessed with clinical judgement   ADL Overall ADL's : Needs assistance/impaired     Grooming: Sitting;Set up;Supervision/safety Grooming Details (indicate cue type and reason): Pt sat EOB to brush his teeth and apply lotion to his legs, PRN VC for L side precautions             Lower Body Dressing: Modified independent Lower Body Dressing Details (indicate cue type and reason): Pt provided with mesh briefs and was able to don from bed level per pt's request, VC to maintain LLE NWBing                     Vision  Perception     Praxis      Cognition Arousal/Alertness: Awake/alert Behavior During Therapy: WFL for tasks assessed/performed Overall Cognitive Status: Within Functional Limits for tasks assessed                                          Exercises Total Joint Exercises Ankle Circles/Pumps: AROM;Right;Left;20 reps;Supine Gluteal Sets: AROM;Both;10 reps;Supine;Strengthening Short Arc Quad: AROM;Strengthening;Both;20 reps;Supine Heel Slides: AROM;Strengthening;Both;15 reps;Supine Hip ABduction/ADduction: AROM;Right;Left;20 reps;Supine Other Exercises Other  Exercises: Pt cued for all exercises performed to improve form and incorporate breathing techniques to reduce valsalva maneuver throughout.   Shoulder Instructions       General Comments      Pertinent Vitals/ Pain       Pain Assessment: 0-10 Pain Score: 2  Pain Location: L wrist Pain Descriptors / Indicators: Aching Pain Intervention(s): Limited activity within patient's tolerance;Monitored during session;Premedicated before session  Home Living                                          Prior Functioning/Environment              Frequency  Min 1X/week        Progress Toward Goals  OT Goals(current goals can now be found in the care plan section)  Progress towards OT goals: Progressing toward goals  Acute Rehab OT Goals Patient Stated Goal: to get stronger OT Goal Formulation: With patient Time For Goal Achievement: 07/31/21 Potential to Achieve Goals: Good  Plan Frequency remains appropriate;Discharge plan remains appropriate    Co-evaluation                 AM-PAC OT "6 Clicks" Daily Activity     Outcome Measure   Help from another person eating meals?: None Help from another person taking care of personal grooming?: A Little Help from another person toileting, which includes using toliet, bedpan, or urinal?: A Little Help from another person bathing (including washing, rinsing, drying)?: A Little Help from another person to put on and taking off regular upper body clothing?: A Little Help from another person to put on and taking off regular lower body clothing?: A Little 6 Click Score: 19    End of Session    OT Visit Diagnosis: Unsteadiness on feet (R26.81);Muscle weakness (generalized) (M62.81);Adult, failure to thrive (R62.7) Pain - Right/Left: Left Pain - part of body: Leg;Arm;Hand   Activity Tolerance Patient tolerated treatment well   Patient Left in bed;with call bell/phone within reach;with bed alarm set   Nurse  Communication          Time: 5573-2202 OT Time Calculation (min): 14 min  Charges: OT General Charges $OT Visit: 1 Visit OT Treatments $Self Care/Home Management : 8-22 mins  Ardeth Perfect., MPH, MS, OTR/L ascom [phone] 07/05/21, 1:40 PM

## 2021-07-05 NOTE — Progress Notes (Signed)
PROGRESS NOTE    Ryan [name]  RKY:[id number] DOB: 1961/05/18 DOA: 06/02/2021 PCP: Pcp, No    Brief Narrative:  60 y.o. 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   Patient was recently hospitalized from 6/21-8/24 due to metastasized prostate cancer. Pt was seen by [name] of oncology. He got his last prostate cancer treatment yesterday. He was just discharged from the hospital yesterday. He was supposed to go to a homeless shelter but apparently there were issues when he was transported there. He sates that he he felt dizzy and fell accidentally. No LOC. He injured his left wrist and left ankle, causing severe pain in left ankle and left wrist.     Left wrist x-ray with fracture of distal radius and ulna.   CT left ankle with impacted distal tibial and fibular fractures, mild posterior displacement and angulation.  Orthopedic was consulted and they are recommending conservative management based on his other comorbidities and advanced malignancy.  Patient will be nonweightbearing her left lower extremity.   Therapy is recommended skilled nursing facility.  Medicaid application is in progress.  TOC working on placement.  As of 9/20 no bed offers   Assessment & Plan:   Principal Problem:   Closed left ankle fracture Active Problems:   Fall   Anemia of chronic disease   Protein-calorie malnutrition, severe (HCC)   Cancer cachexia (HCC)   Prostate cancer metastatic to bone (HCC)   Left wrist fracture, closed, initial encounter   Hypocalcemia   Hyperkalemia   Closed fracture of left distal radius  Closed left ankle fracture and left wrist fracture, closed, consulted [name] of ortho, given patient's metastasized prostate cancer, recommended conservative treatment.  Patient will be nonweightbearing on left lower extremity and follow-up with  orthopedic as an outpatient. Plan: As needed pain control Continue therapy evaluations TOC looking for placement, difficult to place Disability remains pending Senior case management involved in dispo planning   Anemia of chronic disease Hemoglobin low but stable, no obvious bleeding Plan: Transfuse PRBC as needed hemoglobin less than 7     Hyperkalemia.  Resolved. --weekly labs to monitor  Hypomagnesemia Likely secondary to malnutrition Monitor and replace as necessary Maintain Mg greater than 2 Check electrolytes and kidney function intermittently   Severe protein caloric malnutrition/cancer cachexia. --gained weight, eating well now. --noted that pt has been on decadron 4 mg daily for a long time, presumably for appetite stimulation. --Steroids discontinued after 9/13 Plan: Continue with Ensure Appetite improved Nutrition follow-up    Hypotension -Review of previous discharge summary show that patient was discharged with midodrine -Continue 5 mg p.o. 3 times daily - Blood pressure remains soft but stable, can consider stopping this medication at or near discharge   Prostate cancer metastatic to bone [facility]) Consulted [name].   Per [name], Pt was seen in hospital in previous [place].  Appreciate oncology follow-up Consultant service will dictate future inpatient chemotherapy   Hypocalcemia.   Corrected calcium of 6.9.  Received total of 3 g of calcium gluconate IV. Vitamin D levels more than 200. -Continue with calcium supplement   Hyponatremia -Started on saline infusion -Recheck in a.m.   Anxiety and depression. --cont Remeron --cont Atarax PRN   Insomnia -- Continue trazodone 150 mg nightly   DVT prophylaxis: Xarelto Code Status: DNR Family Communication: None Disposition Plan: Status is: Inpatient  Remains inpatient appropriate because:Unsafe  d/c plan  Dispo: The patient is from: Home              Anticipated d/c is to:  TBD               Patient currently is medically stable to d/c.   Difficult to place patient Yes       Level of care: Med-Surg  Consultants:  None  Procedures:  None  Antimicrobials:  None   Subjective: Seen and examined.  Sitting in bed eating.  No visible distress.  No complaints.  Objective: Vitals:   07/05/21 0202 07/05/21 0202 07/05/21 0508 07/05/21 0815  BP:  104/66 (!) 101/59 (!) 92/52  Pulse:  (!) 101 (!) 105 100  Resp:   16 16  Temp: 98.5 F (36.9 C)  98 F (36.7 C) (!) 97.4 F (36.3 C)  TempSrc: Oral  Oral Oral  SpO2:  97% 96% 96%  Weight:      Height:        Intake/Output Summary (Last 24 hours) at 07/05/2021 1018 Last data filed at 07/05/2021 0545 Gross per 24 hour  Intake 240 ml  Output 950 ml  Net -710 ml   Filed Weights   06/02/21 1634 06/16/21 1000 06/23/21 1545  Weight: 52 kg 59.8 kg 62.6 kg    Examination:  General exam: No acute distress.  Appears frail and chronically ill Respiratory system: Lungs clear.  Normal work of breathing.  Room air Cardiovascular system: S1-S2, RRR, no murmurs, no pedal edema Gastrointestinal system: Thin/scaphoid, nontender, nondistended, normal bowel sounds Central nervous system: Alert and oriented. No focal neurological deficits. Extremities: Decreased power symmetrically Skin: No rashes, lesions or ulcers Psychiatry: Judgement and insight appear normal. Mood & affect appropriate.     Data Reviewed: I have personally reviewed following labs and imaging studies  CBC: Recent Labs  Lab 06/29/21 2118  WBC 6.9  HGB 8.1*  HCT 25.7*  MCV 88.6  PLT 209   Basic Metabolic Panel: Recent Labs  Lab 06/28/21 1022 06/30/21 0353  NA 130* 135  K 4.0 4.9  CL 102 109  CO2 20* 21*  GLUCOSE 124* 93  BUN 10 7  CREATININE 0.56* 0.58*  CALCIUM 7.7* 8.0*  MG  --  1.5*  PHOS  --  3.4   GFR: Estimated Creatinine Clearance: 88 mL/min (A) (by C-G formula based on SCr of 0.58 mg/dL (L)). Liver Function Tests: Recent  Labs  Lab 06/30/21 0353  ALBUMIN 3.1*   No results for input(s): LIPASE, AMYLASE in the last 168 hours. No results for input(s): AMMONIA in the last 168 hours. Coagulation Profile: No results for input(s): INR, PROTIME in the last 168 hours. Cardiac Enzymes: No results for input(s): CKTOTAL, CKMB, CKMBINDEX, TROPONINI in the last 168 hours. BNP (last 3 results) No results for input(s): PROBNP in the last 8760 hours. HbA1C: No results for input(s): HGBA1C in the last 72 hours. CBG: Recent Labs  Lab 07/04/21 2107  GLUCAP 113*   Lipid Profile: No results for input(s): CHOL, HDL, LDLCALC, TRIG, CHOLHDL, LDLDIRECT in the last 72 hours. Thyroid Function Tests: No results for input(s): TSH, T4TOTAL, FREET4, T3FREE, THYROIDAB in the last 72 hours. Anemia Panel: No results for input(s): VITAMINB12, FOLATE, FERRITIN, TIBC, IRON, RETICCTPCT in the last 72 hours. Sepsis Labs: No results for input(s): PROCALCITON, LATICACIDVEN in the last 168 hours.  No results found for this or any previous visit (from the past 240 hour(s)).       Radiology  Studies: DG Wrist 2 Views Left  Result Date: 07/04/2021 CLINICAL DATA:  Follow-up left wrist fracture EXAM: LEFT WRIST - 2 VIEW COMPARISON:  06/02/2021 FINDINGS: Evaluation of fine bony detail is degraded secondary to overlying casting material. Redemonstrated comminuted minimally displaced fractures involving distal radius and ulna, now with slightly worsened angulation, apex palmar. Expected callus formation about both fracture sites. No additional fractures identified. Expected adjacent soft tissue swelling. No radiopaque foreign body. IMPRESSION: Suspected slightly worsening angulation of known comminuted distal radial and ulnar metaphyseal fractures. Electronically Signed   By: Sandi Mariscal M.D.   On: 07/04/2021 15:29   DG Ankle Complete Left  Result Date: 07/04/2021 CLINICAL DATA:  Follow-up left ankle fracture EXAM: LEFT ANKLE COMPLETE - 3+  VIEW COMPARISON:  06/02/2021; CT the chest, abdomen and pelvis-03/30/2021 FINDINGS: Evaluation of fine bony detail is degraded secondary to overlying casting material. Expected callus formation about known transverse fractures of the distal tibial and fibular metaphyses with slightly worsened impaction and angulation of the distal tibial fracture, apex anterolateral. The distal fibular fracture appears unchanged in alignment. No additional fractures identified. Redemonstrated multiple sclerotic lesions within the calcaneus, talus and several midfoot tarsal bones worrisome for osseous metastatic disease as demonstrated on CT the chest, abdomen pelvis performed 03/30/2021 IMPRESSION: 1. Minimal displacement/impaction of known distal tibial fracture, apex anterolateral. 2. No definitive change in alignment of known distal fibular fracture. 3. Redemonstrated multiple sclerotic lesions within the osseous structures of the foot compatible with known advanced osseous metastatic disease demonstrated on CT the chest, abdomen pelvis performed 03/30/2021. Electronically Signed   By: Sandi Mariscal M.D.   On: 07/04/2021 15:34        Scheduled Meds:  calcium carbonate  1,250 mg Oral TID WC   feeding supplement  237 mL Oral QID   magnesium oxide  400 mg Oral Daily   midodrine  5 mg Oral TID WC   mirtazapine  15 mg Oral QHS   multivitamin with minerals  1 tablet Oral Daily   nicotine  21 mg Transdermal Daily   rivaroxaban  10 mg Oral Q supper   senna-docusate  1 tablet Oral BID   Continuous Infusions:  sodium chloride Stopped (06/04/21 1216)     LOS: 32 days    Time spent: 15 minutes    Sidney Ace, MD [facility] Pager [phone]  If 7PM-7AM, please contact night-coverage 07/05/2021, 10:18 AM

## 2021-07-05 NOTE — Progress Notes (Signed)
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  Pt recently here for multiple months with difficult d/c planning, did ultimatley d/c to homeless shelter and was gone only a few hours before fall with L ankle and wrist fractures with return to the ED.    PT Comments    Pt received laying in semi-fowler's position and agreeable to only bed level exercises due to "just working with therapy". Pt able to complete all exercises with verbal cueing to improve form and for breathing techniques. Pt continues to be limited by his NWB status on L UE/LE and requires some cueing to adhere to precautions with repositioning in bed. Pt will continue to benefit from skilled PT services during admission to maintain LE strength and progress transfers.    Recommendations for follow up therapy are one component of a multi-disciplinary discharge planning process, led by the attending physician.  Recommendations may be updated based on patient status, additional functional criteria and insurance authorization.  Follow Up Recommendations  SNF;Supervision/Assistance - 24 hour     Equipment Recommendations  Rolling walker with 5" wheels (with platform attachment if still NWB at discharge)    Recommendations for Other Services       Precautions / Restrictions Precautions Precautions: Fall Precaution Comments: NWB LUE/LLE Required Braces or Orthoses: Splint/Cast Spinal Brace Comments: pt no longer using TLSO brace Splint/Cast: L ankle/LUE Restrictions Weight Bearing Restrictions: Yes LUE Weight Bearing: Non weight bearing LLE Weight Bearing: Non weight bearing     Mobility  Bed  Mobility Overal bed mobility: Needs Assistance  General bed mobility comments: Pt requiring minA with scooting up towards HOB with usage of bed linens. Pt able to participate with cues for usage of R UE and R LE. Pt required cues to not use his L LE to assist with scooting towards HOB.    Transfers                 General transfer comment: Pt declining any OOB mobility  Ambulation/Gait             General Gait Details: Pt declining any OOB mobility   Stairs             Wheelchair Mobility    Modified Rankin (Stroke Patients Only)       Balance  Did not assess due to patient declining any seated exercises         Cognition Arousal/Alertness: Awake/alert Behavior During Therapy: WFL for tasks assessed/performed Overall Cognitive Status: Within Functional Limits for tasks assessed         Exercises Total Joint Exercises Ankle Circles/Pumps: AROM;Right;Left;20 reps;Supine Gluteal Sets: AROM;Both;10 reps;Supine;Strengthening Short Arc Quad: AROM;Strengthening;Both;20 reps;Supine Heel Slides: AROM;Strengthening;Both;15 reps;Supine Hip ABduction/ADduction: AROM;Right;Left;20 reps;Supine Other Exercises Other Exercises: Pt cued for all exercises performed to improve form and incorporate breathing techniques to reduce valsalva maneuver throughout.    General Comments        Pertinent Vitals/Pain Pain Assessment: No/denies pain Pain Score: 3  Pain Location: L knee Pain Descriptors / Indicators: Grimacing Pain Intervention(s): Limited activity within patient's tolerance;Repositioned;Utilized relaxation techniques    Home Living           Prior Function  PT Goals (current goals can now be found in the care plan section) Acute Rehab PT Goals Patient Stated Goal: to get stronger PT Goal Formulation: With patient Time For Goal Achievement: 07/19/21 Potential to Achieve Goals: Fair Progress towards PT goals: Progressing toward  goals    Frequency    Min 2X/week      PT Plan Current plan remains appropriate    Co-evaluation              AM-PAC PT "6 Clicks" Mobility   Outcome Measure  Help needed turning from your back to your side while in a flat bed without using bedrails?: A Little Help needed moving from lying on your back to sitting on the side of a flat bed without using bedrails?: A Little Help needed moving to and from a bed to a chair (including a wheelchair)?: A Lot Help needed standing up from a chair using your arms (e.g., wheelchair or bedside chair)?: A Lot Help needed to walk in hospital room?: Total Help needed climbing 3-5 steps with a railing? : Total 6 Click Score: 12    End of Session   Activity Tolerance: Patient tolerated treatment well Patient left: in bed;with call bell/phone within reach;with bed alarm set   PT Visit Diagnosis: Unsteadiness on feet (R26.81);Muscle weakness (generalized) (M62.81);Difficulty in walking, not elsewhere classified (R26.2);History of falling (Z91.81) Pain - Right/Left: Left Pain - part of body: Knee;Ankle and joints of foot     Time: 2330-0762 PT Time Calculation (min) (ACUTE ONLY): 18 min  Charges:  $Therapeutic Exercise: 8-22 mins                     Andrey Campanile, SPT   Andrey Campanile 07/05/2021, 1:31 PM

## 2021-07-05 NOTE — Plan of Care (Signed)
  Problem: Education: Goal: Knowledge of General Education information will improve Description: Including pain rating scale, medication(s)/side effects and non-pharmacologic comfort measures Outcome: Progressing   Problem: Health Behavior/Discharge Planning: Goal: Ability to manage health-related needs will improve Outcome: Progressing   Problem: Clinical Measurements: Goal: Ability to maintain clinical measurements within normal limits will improve Outcome: Progressing Goal: Will remain free from infection Outcome: Progressing Goal: Diagnostic test results will improve Outcome: Progressing Goal: Respiratory complications will improve Outcome: Progressing Goal: Cardiovascular complication will be avoided Outcome: Progressing   Problem: Activity: Goal: Risk for activity intolerance will decrease Outcome: Progressing   Problem: Nutrition: Goal: Adequate nutrition will be maintained Outcome: Progressing   Problem: Coping: Goal: Level of anxiety will decrease Outcome: Progressing   Problem: Elimination: Goal: Will not experience complications related to bowel motility Outcome: Progressing Goal: Will not experience complications related to urinary retention Outcome: Progressing   Problem: Pain Managment: Goal: General experience of comfort will improve Outcome: Progressing   Problem: Safety: Goal: Ability to remain free from injury will improve Outcome: Progressing   Problem: Education: Goal: Knowledge of the prescribed therapeutic regimen will improve Outcome: Progressing   Problem: Pain Management: Goal: Pain level will decrease with appropriate interventions Outcome: Progressing

## 2021-07-05 NOTE — Progress Notes (Signed)
PT Cancellation Note  Patient Details Name: Ryan [name] MRN: [id number] DOB: 1960-12-08   Cancelled Treatment:    Reason Eval/Treat Not Completed: Fatigue/lethargy limiting ability to participate PT attempted to treat patient this morning. Upon arrival, pt was sound asleep and unable to be roused for treatment. Will re-attempt later today if time permits.   Andrey Campanile, SPT    Andrey Campanile 07/05/2021, 10:03 AM

## 2021-07-06 NOTE — TOC Progression Note (Signed)
Transition of Care [facility]) - Progression Note    Patient Details  Name: Buster Schueller MRN: [id number] Date of Birth: 05-Dec-1960  Transition of Care [facility]) CM/SW [place], RN Phone Number: 07/06/2021, 9:55 AM  Clinical Narrative:    Numerous attempts made to fax requested information to [facility] related to disability application however fax failed. LVMM requesting alternate fax number.         Expected Discharge Plan and Services                                                 Social Determinants of Health (SDOH) Interventions    Readmission Risk Interventions Readmission Risk Prevention Plan 04/23/2021  Transportation Screening Complete  PCP or Specialist Appt within 3-5 Days Complete  HRI or [place] Complete  Social Work Consult for [place] Planning/Counseling Not Complete  SW consult not completed comments RNCM assigned to patient  Palliative Care Screening Complete  Medication Review Press photographer) Complete  Some recent data might be hidden

## 2021-07-06 NOTE — Progress Notes (Signed)
   07/06/21 1200  Assess: MEWS Score  Temp 98 F (36.7 C)  BP (!) 104/52  Pulse Rate 100  Resp 16  Assess: MEWS Score  MEWS Temp 0  MEWS Systolic 0  MEWS Pulse 0  MEWS RR 0  MEWS LOC 0  MEWS Score 0  MEWS Score Color Green  Assess: if the MEWS score is Yellow or Red  Were vital signs taken at a resting state? Yes  Focused Assessment No change from prior assessment  Does the patient meet 2 or more of the SIRS criteria? No  Does the patient have a confirmed or suspected source of infection? No  MEWS guidelines implemented *See Row Information* No, vital signs rechecked  Document  Progress note created (see row info) Yes  Assess: SIRS CRITERIA  SIRS Temperature  0  SIRS Pulse 1  SIRS Respirations  0  SIRS WBC 0  SIRS Score Sum  1

## 2021-07-06 NOTE — Progress Notes (Addendum)
Nutrition Follow-up  DOCUMENTATION CODES:  Severe malnutrition in context of chronic illness, Underweight  INTERVENTION:  Continue current diet as ordered Continue Ensure Enlive po QID, each supplement provides 350 kcal and 20 grams of protein Continue MVI with minerals daily Continue snacks TID Request weekly weights  NUTRITION DIAGNOSIS:  Severe Malnutrition related to chronic illness, cancer and cancer related treatments as evidenced by severe fat depletion, severe muscle depletion.  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 Recently admitted 6/21-8/24. Discharged to homeless shelter then returned to ED a few hours later after a fall and pain to his wrist.  Pt sleeping at the time of visit, did not wake to name being called. Noted lunch tray at bedside, minimally eaten. Took new weight, pt continues to have positive weight gain per bed weight.  Stable for dc, case management team working on placement. No bed offers at this time  Average Meal Intake: 8/27-8/29: 72% intake x 2 recorded meals (50-94%) 8/30-9/7: 63% intake x 2 recorded meals (25-100%) 9/8-9/14: 50% intake x 3 recorded meals (50%) 9/15-9/20: 35% intake x 2 recorded meals (20-50%) 9/21-9/27: 50% intake x 3 recorded meals (0-100%)  Nutritionally Relevant Medications: Scheduled Meds:  calcium carbonate  1,250 mg Oral TID WC   feeding supplement  237 mL Oral QID   magnesium oxide  400 mg Oral Daily   midodrine  5 mg Oral TID WC   mirtazapine  15 mg Oral QHS   multivitamin with minerals  1 tablet Oral Daily   senna-docusate  1 tablet Oral BID   PRN Meds: ondansetron  Labs Reviewed 9/21: Mg 1.5  NUTRITION - FOCUSED PHYSICAL  EXAM: Flowsheet Row Most Recent Value  Orbital Region Mild depletion  Upper Arm Region Severe depletion  Thoracic and Lumbar Region Severe depletion  Buccal Region Mild depletion  Temple Region Moderate depletion  Clavicle Bone Region Severe depletion  Clavicle and Acromion Bone Region Severe depletion  Scapular Bone Region Severe depletion  Dorsal Hand Severe depletion  Patellar Region Severe depletion  Anterior Thigh Region Severe depletion  Posterior Calf Region Severe depletion  Edema (RD Assessment) None  Hair Reviewed  Eyes Reviewed  Mouth Reviewed  Skin Reviewed  Nails Reviewed    Diet Order:   Diet Order             Diet regular Room service appropriate? Yes; Fluid consistency: Thin  Diet effective now                  EDUCATION NEEDS:  Education needs have been addressed  Skin:  Skin Assessment: Reviewed RN Assessment  Last BM:  9/25 - type 4  Height:  Ht Readings from Last 1 Encounters:  07/06/21 6\' 5"  (3.335 m)   Weight:  Wt Readings from Last 1 Encounters:  07/06/21 65.6 kg   Ideal Body Weight:  94.5 kg  BMI:  Body mass index is 17.15 kg/m.  Estimated Nutritional Needs:  Kcal:  2400-2600 Protein:  130-150 grams Fluid:  >2.4 L  Ranell Patrick, RD, LDN Clinical Dietitian Pager on [place]

## 2021-07-06 NOTE — Progress Notes (Signed)
PT Cancellation Note  Patient Details Name: Rendell Thivierge MRN: [id number] DOB: November 25, 1960   Cancelled Treatment:    Reason Eval/Treat Not Completed: Patient declined, no reason specified. Patient politely declined. He is requesting to know prior to therapy arrival so he can be prepared to work with therapist for future visits.    Minna Merritts, PT, MPT  [name] 07/06/2021, 4:15 PM

## 2021-07-06 NOTE — Progress Notes (Signed)
PROGRESS NOTE    Ryan [name]  DVV:[id number] DOB: 03-24-1961 DOA: 06/02/2021 PCP: Pcp, No    Brief Narrative:  60 y.o. 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   Patient was recently hospitalized from 6/21-8/24 due to metastasized prostate cancer. Pt was seen by [name] of oncology. He got his last prostate cancer treatment yesterday. He was just discharged from the hospital yesterday. He was supposed to go to a homeless shelter but apparently there were issues when he was transported there. He sates that he he felt dizzy and fell accidentally. No LOC. He injured his left wrist and left ankle, causing severe pain in left ankle and left wrist.     Left wrist x-ray with fracture of distal radius and ulna.   CT left ankle with impacted distal tibial and fibular fractures, mild posterior displacement and angulation.  Orthopedic was consulted and they are recommending conservative management based on his other comorbidities and advanced malignancy.  Patient will be nonweightbearing her left lower extremity.   Therapy has made recommendations for skilled nursing facility.  Medicaid application is in progress.  TOC is working on placement but have as of 9/27 we have no bed offers.    Assessment & Plan:   Principal Problem:   Closed left ankle fracture Active Problems:   Fall   Anemia of chronic disease   Protein-calorie malnutrition, severe (HCC)   Cancer cachexia (HCC)   Prostate cancer metastatic to bone (HCC)   Left wrist fracture, closed, initial encounter   Hypocalcemia   Hyperkalemia   Closed fracture of left distal radius  Closed left ankle fracture and left wrist fracture, closed, consulted [name] of ortho, given patient's metastasized prostate cancer, recommended conservative treatment.  Patient will be nonweightbearing on left lower  extremity and follow-up with orthopedic as an outpatient. Plan: As needed pain control Continue therapy evaluations TOC looking for placement, difficult to place Disability remains pending Senior case management involved in dispo planning As of 9/27 no bed offers    Anemia of chronic disease Hemoglobin low but stable, no obvious bleeding Plan: Transfuse PRBC as needed hemoglobin less than 7     Hyperkalemia.  Resolved. --weekly labs to monitor  Hypomagnesemia Likely secondary to malnutrition Monitor and replace as necessary Maintain Mg greater than 2 Check electrolytes and kidney function intermittently   Severe protein caloric malnutrition/cancer cachexia. --gained weight, eating well now. --noted that pt has been on decadron 4 mg daily for a long time, presumably for appetite stimulation. --Steroids discontinued after 9/13 Plan: Continue with Ensure Appetite improved Nutrition follow-up    Hypotension -Review of previous discharge summary show that patient was discharged with midodrine -Continue 5 mg p.o. 3 times daily - Blood pressure remains soft but stable, can consider stopping this medication at or near discharge   Prostate cancer metastatic to bone [facility]) Consulted [name].   Per [name], Pt was seen in hospital in previous [place].  Appreciate oncology follow-up Consultant service will dictate future inpatient chemotherapy   Hypocalcemia.   Corrected calcium of 6.9.  Received total of 3 g of calcium gluconate IV. Vitamin D levels more than 200. -Continue with calcium supplement   Hyponatremia -Started on saline infusion -Recheck in a.m.   Anxiety and depression. --cont Remeron --cont Atarax PRN   Insomnia -- Continue trazodone 150 mg nightly   DVT prophylaxis: Xarelto Code Status:  DNR Family Communication: None Disposition Plan: Status is: Inpatient  Remains inpatient appropriate because:Unsafe d/c plan  Dispo: The patient is  from: Home              Anticipated d/c is to:  TBD              Patient currently is medically stable to d/c.   Difficult to place patient Yes       Level of care: Med-Surg  Consultants:  None  Procedures:  None  Antimicrobials:  None   Subjective: Seen and examined.  Sitting in bed eating.  No visible distress.  No complaints.  Objective: Vitals:   07/05/21 1539 07/05/21 2052 07/06/21 0511 07/06/21 0826  BP: (!) 83/53 (!) 91/51 121/62 101/63  Pulse: 100 99 98 100  Resp: 15 18 16 14   Temp: 97.8 F (36.6 C) 97.7 F (36.5 C) 97.9 F (36.6 C) 97.7 F (36.5 C)  TempSrc:   Oral   SpO2: 95% 95% 99% 99%  Weight:      Height:        Intake/Output Summary (Last 24 hours) at 07/06/2021 1041 Last data filed at 07/06/2021 0838 Gross per 24 hour  Intake --  Output 1075 ml  Net -1075 ml   Filed Weights   06/02/21 1634 06/16/21 1000 06/23/21 1545  Weight: 52 kg 59.8 kg 62.6 kg    Examination:  General exam: No acute distress.  Appears frail and chronically ill Respiratory system: Lungs clear.  Normal work of breathing.  Room air Cardiovascular system: S1-S2, RRR, no murmurs, no pedal edema Gastrointestinal system: Thin/scaphoid, nontender, nondistended, normal bowel sounds Central nervous system: Alert and oriented. No focal neurological deficits. Extremities: Decreased power symmetrically Skin: No rashes, lesions or ulcers Psychiatry: Judgement and insight appear normal. Mood & affect appropriate.     Data Reviewed: I have personally reviewed following labs and imaging studies  CBC: Recent Labs  Lab 06/29/21 2118  WBC 6.9  HGB 8.1*  HCT 25.7*  MCV 88.6  PLT 878   Basic Metabolic Panel: Recent Labs  Lab 06/30/21 0353  NA 135  K 4.9  CL 109  CO2 21*  GLUCOSE 93  BUN 7  CREATININE 0.58*  CALCIUM 8.0*  MG 1.5*  PHOS 3.4   GFR: Estimated Creatinine Clearance: 88 mL/min (A) (by C-G formula based on SCr of 0.58 mg/dL (L)). Liver Function  Tests: Recent Labs  Lab 06/30/21 0353  ALBUMIN 3.1*   No results for input(s): LIPASE, AMYLASE in the last 168 hours. No results for input(s): AMMONIA in the last 168 hours. Coagulation Profile: No results for input(s): INR, PROTIME in the last 168 hours. Cardiac Enzymes: No results for input(s): CKTOTAL, CKMB, CKMBINDEX, TROPONINI in the last 168 hours. BNP (last 3 results) No results for input(s): PROBNP in the last 8760 hours. HbA1C: No results for input(s): HGBA1C in the last 72 hours. CBG: Recent Labs  Lab 07/04/21 2107  GLUCAP 113*   Lipid Profile: No results for input(s): CHOL, HDL, LDLCALC, TRIG, CHOLHDL, LDLDIRECT in the last 72 hours. Thyroid Function Tests: No results for input(s): TSH, T4TOTAL, FREET4, T3FREE, THYROIDAB in the last 72 hours. Anemia Panel: No results for input(s): VITAMINB12, FOLATE, FERRITIN, TIBC, IRON, RETICCTPCT in the last 72 hours. Sepsis Labs: No results for input(s): PROCALCITON, LATICACIDVEN in the last 168 hours.  No results found for this or any previous visit (from the past 240 hour(s)).       Radiology Studies: DG Wrist  2 Views Left  Result Date: 07/04/2021 CLINICAL DATA:  Follow-up left wrist fracture EXAM: LEFT WRIST - 2 VIEW COMPARISON:  06/02/2021 FINDINGS: Evaluation of fine bony detail is degraded secondary to overlying casting material. Redemonstrated comminuted minimally displaced fractures involving distal radius and ulna, now with slightly worsened angulation, apex palmar. Expected callus formation about both fracture sites. No additional fractures identified. Expected adjacent soft tissue swelling. No radiopaque foreign body. IMPRESSION: Suspected slightly worsening angulation of known comminuted distal radial and ulnar metaphyseal fractures. Electronically Signed   By: Sandi Mariscal M.D.   On: 07/04/2021 15:29   DG Ankle Complete Left  Result Date: 07/04/2021 CLINICAL DATA:  Follow-up left ankle fracture EXAM: LEFT ANKLE  COMPLETE - 3+ VIEW COMPARISON:  06/02/2021; CT the chest, abdomen and pelvis-03/30/2021 FINDINGS: Evaluation of fine bony detail is degraded secondary to overlying casting material. Expected callus formation about known transverse fractures of the distal tibial and fibular metaphyses with slightly worsened impaction and angulation of the distal tibial fracture, apex anterolateral. The distal fibular fracture appears unchanged in alignment. No additional fractures identified. Redemonstrated multiple sclerotic lesions within the calcaneus, talus and several midfoot tarsal bones worrisome for osseous metastatic disease as demonstrated on CT the chest, abdomen pelvis performed 03/30/2021 IMPRESSION: 1. Minimal displacement/impaction of known distal tibial fracture, apex anterolateral. 2. No definitive change in alignment of known distal fibular fracture. 3. Redemonstrated multiple sclerotic lesions within the osseous structures of the foot compatible with known advanced osseous metastatic disease demonstrated on CT the chest, abdomen pelvis performed 03/30/2021. Electronically Signed   By: Sandi Mariscal M.D.   On: 07/04/2021 15:34        Scheduled Meds:  calcium carbonate  1,250 mg Oral TID WC   feeding supplement  237 mL Oral QID   magnesium oxide  400 mg Oral Daily   midodrine  5 mg Oral TID WC   mirtazapine  15 mg Oral QHS   multivitamin with minerals  1 tablet Oral Daily   nicotine  21 mg Transdermal Daily   rivaroxaban  10 mg Oral Q supper   senna-docusate  1 tablet Oral BID   Continuous Infusions:  sodium chloride Stopped (06/04/21 1216)     LOS: 33 days    Time spent: 15 minutes    Sidney Ace, MD [facility] Pager [phone]  If 7PM-7AM, please contact night-coverage 07/06/2021, 10:41 AM

## 2021-07-07 ENCOUNTER — Encounter: Payer: Self-pay | Admitting: Internal Medicine

## 2021-07-07 LAB — BASIC METABOLIC PANEL
Anion gap: 8 (ref 5–15)
BUN: 11 mg/dL (ref 6–20)
CO2: 20 mmol/L — ABNORMAL LOW (ref 22–32)
Calcium: 8.4 mg/dL — ABNORMAL LOW (ref 8.9–10.3)
Chloride: 104 mmol/L (ref 98–111)
Creatinine, Ser: 0.65 mg/dL (ref 0.61–1.24)
GFR, Estimated: >60 mL/min (ref >60)
Glucose, Bld: 129 mg/dL — ABNORMAL HIGH (ref 70–99)
Potassium: 4.4 mmol/L (ref 3.5–5.1)
Sodium: 132 mmol/L — ABNORMAL LOW (ref 135–145)

## 2021-07-07 LAB — CBC
HCT: 28.2 % — ABNORMAL LOW (ref 39.0–52.0)
Hemoglobin: 8.6 g/dL — ABNORMAL LOW (ref 13.0–17.0)
MCH: 26.1 pg (ref 26.0–34.0)
MCHC: 30.5 g/dL (ref 30.0–36.0)
MCV: 85.5 fL (ref 80.0–100.0)
Platelets: 235 10*3/uL (ref 150–400)
RBC: 3.3 MIL/uL — ABNORMAL LOW (ref 4.22–5.81)
RDW: 15.3 % (ref 11.5–15.5)
WBC: 6.1 10*3/uL (ref 4.0–10.5)
nRBC: 0 % (ref 0.0–0.2)

## 2021-07-07 LAB — MAGNESIUM: Magnesium: 1.7 mg/dL (ref 1.7–2.4)

## 2021-07-07 LAB — PHOSPHORUS: Phosphorus: 3.8 mg/dL (ref 2.5–4.6)

## 2021-07-07 NOTE — TOC Progression Note (Addendum)
Transition of Care [facility]) - Progression Note    Patient Details  Name: Youssef Footman MRN: [id number] Date of Birth: Feb 15, 1961  Transition of Care [facility]) CM/SW [place], RN Phone Number: 07/07/2021, 1:46 PM  Clinical Narrative:    Damaris Schooner with Delana Meyer at [place], Reviewed the patient with her, She will come see the patient today and assess for appropriateness for their facility  The Case worker for [place] DSS is Winona Legato [phone]    Update, Holladay from [facility] came to visit the patient and he is agreeable to go to the facility upon DC if they accept him as a patient, LOG for 90 days will be approved per [place] President of T[employer] Awaiting a call back from [place] from [facility] with a bed offer Rashonda with DSS called me back, She will print out the Special assistance medicaid application and bring it to the Hospital to help him fill it out and get in the process for approval    Expected Discharge Plan and Services                                                 Social Determinants of Health (SDOH) Interventions    Readmission Risk Interventions Readmission Risk Prevention Plan 04/23/2021  Transportation Screening Complete  PCP or Specialist Appt within 3-5 Days Complete  HRI or Home Care Consult Complete  Social Work Consult for [place] Planning/Counseling Not Complete  SW consult not completed comments RNCM assigned to patient  Palliative Care Screening Complete  Medication Review Press photographer) Complete  Some recent data might be hidden

## 2021-07-07 NOTE — TOC Progression Note (Addendum)
Transition of Care [facility]) - Progression Note    Patient Details  Name: Ryan [name] MRN: [id number] Date of Birth: [birth year]  Transition of Care [facility]) CM/SW [place], [place] Phone Number: [phone] 07/07/2021, 11:05 AM  Clinical Narrative:     CSW faxed Claimant's Revocation of the Appointment of a Representative form to [phone], attention Dyann Ruddle, email: [email or web address]  Faxed failed.  Form emailed directly to [employer], email: [email or web address]       Expected Discharge Plan and Services                                                 Social Determinants of Health (SDOH) Interventions    Readmission Risk Interventions Readmission Risk Prevention Plan 04/23/2021  Transportation Screening Complete  PCP or Specialist Appt within 3-5 Days Complete  HRI or [place] Complete  Social Work Consult for [place] Planning/Counseling Not Complete  SW consult not completed comments RNCM assigned to patient  Palliative Care Screening Complete  Medication Review Press photographer) Complete  Some recent data might be hidden

## 2021-07-07 NOTE — Progress Notes (Signed)
PT Cancellation Note  Patient Details Name: Ryan [name] MRN: [id number] DOB: [birth year]   Cancelled Treatment:     Pt attempted at 8:50am and 11:40am, refusing any activity due to c/o fatigue. Pt has been educated numerous times regarding the importance of OOB activity and participation in therapy sessions.  Continue to recommend SNF placement upon d/c.   Josie Dixon 07/07/2021, 11:42 AM

## 2021-07-07 NOTE — TOC Progression Note (Signed)
Transition of Care [facility]) - Progression Note    Patient Details  Name: Ryan [name] MRN: [id number] Date of Birth: 10/28/1960  Transition of Care [facility]) CM/SW [place], RN Phone Number: 07/07/2021, 11:04 AM  Clinical Narrative:     Resent the request for a long term bed thru the hub, offered LOG with disability pending, and medicaid pending, Called [facility] and left a secure Voice Mail requesting a long term bed, [facility] and left a secure VM looking for a long term bed       Expected Discharge Plan and Services                                                 Social Determinants of Health (SDOH) Interventions    Readmission Risk Interventions Readmission Risk Prevention Plan 04/23/2021  Transportation Screening Complete  PCP or Specialist Appt within 3-5 Days Complete  HRI or Home Care Consult Complete  Social Work Consult for [place] Planning/Counseling Not Complete  SW consult not completed comments RNCM assigned to patient  Palliative Care Screening Complete  Medication Review Press photographer) Complete  Some recent data might be hidden

## 2021-07-07 NOTE — Progress Notes (Addendum)
Progress Note    Cortlan Dolin  ESP:[id number] DOB: [birth year]  DOA: 06/02/2021 PCP: Pcp, No      Brief Narrative:    Medical records reviewed and are as summarized below:  Ryan [name] is a 60 y.o. 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 Patient was recently hospitalized from 6/21-8/24 due to metastasized prostate cancer. Pt was seen by [name] of oncology.  He was supposed to go to a homeless shelter but reportedly, he had issues when he was transported there.  He said he felt dizzy and fell accidentally causing severe pain in the left wrist and left ankle.  Work-up revealed fracture of the left distal radius and ulna and left distal tibial and fibular fractures.  He was treated with analgesics.  Orthopedic surgery recommended conservative management.      Assessment/Plan:   Principal Problem:   Closed left ankle fracture Active Problems:   Fall   Anemia of chronic disease   Protein-calorie malnutrition, severe (HCC)   Cancer cachexia ([place])   Prostate cancer metastatic to bone (HCC)   Left wrist fracture, closed, initial encounter   Hypocalcemia   Hyperkalemia   Closed fracture of left distal radius   Nutrition Problem: Severe Malnutrition Etiology: chronic illness, cancer and cancer related treatments  Signs/Symptoms: severe fat depletion, severe muscle depletion   Body mass index is 17.15 kg/m.   Closed left ankle fracture and left wrist fracture: Orthopedic surgery recommended conservative management.  Patient will be nonweightbearing left lower extremity.  Outpatient follow-up with orthopedic surgeon.  Stage IV prostate cancer with metastasis to the bone: Outpatient follow-up with oncologist.  Hypocalcemia: Continue calcium carbonate  Hyponatremia:  Asymptomatic.  Chronic intermittent hypotension: Continue midodrine  Other comorbidities include anxiety, depression, cachexia, insomnia     Diet Order             Diet regular Room service appropriate? Yes; Fluid consistency: Thin  Diet effective now                      Consultants: Oncologist Orthopedic surgeon  Procedures: None    Medications:    calcium carbonate  1,250 mg Oral TID WC   feeding supplement  237 mL Oral QID   magnesium oxide  400 mg Oral Daily   midodrine  5 mg Oral TID WC   mirtazapine  15 mg Oral QHS   multivitamin with minerals  1 tablet Oral Daily   nicotine  21 mg Transdermal Daily   rivaroxaban  10 mg Oral Q supper   senna-docusate  1 tablet Oral BID   Continuous Infusions:  sodium chloride Stopped (06/04/21 1216)     Anti-infectives (From admission, onward)    None              Family Communication/Anticipated D/C date and plan/Code Status   DVT prophylaxis: rivaroxaban (XARELTO) tablet 10 mg Start: 06/29/21 2215 rivaroxaban (XARELTO) tablet 10 mg     Code Status: DNR  Family Communication: None Disposition Plan:    Status is: Inpatient  Remains inpatient appropriate because:Unsafe d/c plan  Dispo: The patient is from: Home              Anticipated d/c is to: SNF              Patient  currently is medically stable to d/c.   Difficult to place patient Yes           Subjective:   Interval events noted.  No shortness of breath, chest pain, dizziness or pain.  Objective:    Vitals:   07/06/21 2354 07/07/21 0456 07/07/21 0802 07/07/21 1128  BP: (!) 108/58 (!) 108/50 91/63 104/66  Pulse: (!) 101 (!) 101 100 100  Resp: 14 14 18 20   Temp: 98.4 F (36.9 C) 98.1 F (36.7 C) 98.7 F (37.1 C) 98.8 F (37.1 C)  TempSrc:      SpO2: 95% 97% 97% 99%  Weight:      Height:       No data found.   Intake/Output Summary (Last 24 hours) at 07/07/2021 1301 Last data filed at 07/07/2021 1058 Gross  per 24 hour  Intake 1200 ml  Output 1350 ml  Net -150 ml   Filed Weights   06/16/21 1000 06/23/21 1545 07/06/21 1406  Weight: 59.8 kg 62.6 kg 65.6 kg    Exam:  GEN: NAD SKIN: No rash EYES: EOMI ENT: MMM CV: RRR PULM: CTA B ABD: soft, ND, NT, +BS CNS: AAO x 3, non focal EXT: Dressings on left hand and leg are dry, clean and intact.  No edema or tenderness        Data Reviewed:   I have personally reviewed following labs and imaging studies:  Labs: Labs show the following:   Basic Metabolic Panel: Recent Labs  Lab 07/07/21 0848  NA 132*  K 4.4  CL 104  CO2 20*  GLUCOSE 129*  BUN 11  CREATININE 0.65  CALCIUM 8.4*  MG 1.7  PHOS 3.8   GFR Estimated Creatinine Clearance: 92.3 mL/min (by C-G formula based on SCr of 0.65 mg/dL). Liver Function Tests: No results for input(s): AST, ALT, ALKPHOS, BILITOT, PROT, ALBUMIN in the last 168 hours. No results for input(s): LIPASE, AMYLASE in the last 168 hours. No results for input(s): AMMONIA in the last 168 hours. Coagulation profile No results for input(s): INR, PROTIME in the last 168 hours.  CBC: Recent Labs  Lab 07/07/21 1110  WBC 6.1  HGB 8.6*  HCT 28.2*  MCV 85.5  PLT 235   Cardiac Enzymes: No results for input(s): CKTOTAL, CKMB, CKMBINDEX, TROPONINI in the last 168 hours. BNP (last 3 results) No results for input(s): PROBNP in the last 8760 hours. CBG: Recent Labs  Lab 07/04/21 2107  GLUCAP 113*   D-Dimer: No results for input(s): DDIMER in the last 72 hours. Hgb A1c: No results for input(s): HGBA1C in the last 72 hours. Lipid Profile: No results for input(s): CHOL, HDL, LDLCALC, TRIG, CHOLHDL, LDLDIRECT in the last 72 hours. Thyroid function studies: No results for input(s): TSH, T4TOTAL, T3FREE, THYROIDAB in the last 72 hours.  Invalid input(s): FREET3 Anemia work up: No results for input(s): VITAMINB12, FOLATE, FERRITIN, TIBC, IRON, RETICCTPCT in the last 72 hours. Sepsis  Labs: Recent Labs  Lab 07/07/21 1110  WBC 6.1    Microbiology No results found for this or any previous visit (from the past 240 hour(s)).  Procedures and diagnostic studies:  No results found.             LOS: 34 days   [place] Copywriter, advertising on [email or web address]. If 7PM-7AM, please contact night-coverage at [email or web address]     07/07/2021, 1:01 PM

## 2021-07-08 NOTE — Progress Notes (Signed)
Progress Note    Ryan [name]  SVX:[id number] DOB: 01/12/1961  DOA: 06/02/2021 PCP: Pcp, No      Brief Narrative:    Medical records reviewed and are as summarized below:  Ryan [name] is a 60 y.o. 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 Patient was recently hospitalized from 6/21-8/24 due to metastasized prostate cancer. Pt was seen by [name] of oncology.  He was supposed to go to a homeless shelter but reportedly, he had issues when he was transported there.  He said he felt dizzy and fell accidentally causing severe pain in the left wrist and left ankle.  Work-up revealed fracture of the left distal radius and ulna and left distal tibial and fibular fractures.  He was treated with analgesics.  Orthopedic surgery recommended conservative management.      Assessment/Plan:   Principal Problem:   Closed left ankle fracture Active Problems:   Fall   Anemia of chronic disease   Protein-calorie malnutrition, severe (HCC)   Cancer cachexia ([place])   Prostate cancer metastatic to bone (HCC)   Left wrist fracture, closed, initial encounter   Hypocalcemia   Hyperkalemia   Closed fracture of left distal radius   Nutrition Problem: Severe Malnutrition Etiology: chronic illness, cancer and cancer related treatments  Signs/Symptoms: severe fat depletion, severe muscle depletion   Body mass index is 16.34 kg/m.   Closed left ankle fracture and left wrist fracture: Orthopedic surgery recommended conservative management.  Patient will be nonweightbearing left lower extremity.  Outpatient follow-up with orthopedic surgeon.  Stage IV prostate cancer with metastasis to the bone: He was happy to hear that PSA level has dropped from > 3000 (2 months ago) to 433 (1 month ago).  Outpatient  follow-up with oncologist.  Hypocalcemia: Continue calcium carbonate  Hyponatremia: Asymptomatic.  Chronic intermittent hypotension: Continue midodrine  Other comorbidities include anxiety, depression, cachexia, insomnia     Diet Order             Diet regular Room service appropriate? Yes; Fluid consistency: Thin  Diet effective now                      Consultants: Oncologist Orthopedic surgeon  Procedures: None    Medications:    calcium carbonate  1,250 mg Oral TID WC   feeding supplement  237 mL Oral QID   magnesium oxide  400 mg Oral Daily   midodrine  5 mg Oral TID WC   mirtazapine  15 mg Oral QHS   multivitamin with minerals  1 tablet Oral Daily   nicotine  21 mg Transdermal Daily   rivaroxaban  10 mg Oral Q supper   senna-docusate  1 tablet Oral BID   Continuous Infusions:  sodium chloride Stopped (06/04/21 1216)     Anti-infectives (From admission, onward)    None              Family Communication/Anticipated D/C date and plan/Code Status   DVT prophylaxis: rivaroxaban (XARELTO) tablet 10 mg Start: 06/29/21 2215 rivaroxaban (XARELTO) tablet 10 mg     Code Status: DNR  Family Communication: None Disposition Plan:    Status is: Inpatient  Remains inpatient appropriate because:Unsafe d/c plan  Dispo: The patient is from: Home  Anticipated d/c is to: SNF              Patient currently is medically stable to d/c.   Difficult to place patient Yes           Subjective:   Interval events noted.  He has no complaints.  He has questions about the progression of his prostate cancer and PSA levels.  Objective:    Vitals:   07/07/21 1953 07/07/21 2319 07/08/21 0420 07/08/21 0753  BP: 105/60 113/61 (!) 115/59 (!) 104/53  Pulse: 99 97 93 87  Resp: 18 18 16 16   Temp: 98.1 F (36.7 C) 98.2 F (36.8 C) 97.7 F (36.5 C) 98 F (36.7 C)  TempSrc: Oral Oral  Oral  SpO2: 99% 98% 98% 100%  Weight:       Height:       No data found.   Intake/Output Summary (Last 24 hours) at 07/08/2021 1530 Last data filed at 07/08/2021 0817 Gross per 24 hour  Intake --  Output 900 ml  Net -900 ml   Filed Weights   06/23/21 1545 07/06/21 1406 07/07/21 1443  Weight: 62.6 kg 65.6 kg 62.5 kg    Exam:  GEN: NAD SKIN: Warm and dry EYES: EOMI ENT: MMM CV: RRR PULM: CTA B ABD: soft, ND, NT, +BS CNS: AAO x 3, non focal EXT: No edema or tenderness.  Dressing on left hand and left leg dry and intact.       Data Reviewed:   I have personally reviewed following labs and imaging studies:  Labs: Labs show the following:   Basic Metabolic Panel: Recent Labs  Lab 07/07/21 0848  NA 132*  K 4.4  CL 104  CO2 20*  GLUCOSE 129*  BUN 11  CREATININE 0.65  CALCIUM 8.4*  MG 1.7  PHOS 3.8   GFR Estimated Creatinine Clearance: 87.9 mL/min (by C-G formula based on SCr of 0.65 mg/dL). Liver Function Tests: No results for input(s): AST, ALT, ALKPHOS, BILITOT, PROT, ALBUMIN in the last 168 hours. No results for input(s): LIPASE, AMYLASE in the last 168 hours. No results for input(s): AMMONIA in the last 168 hours. Coagulation profile No results for input(s): INR, PROTIME in the last 168 hours.  CBC: Recent Labs  Lab 07/07/21 1110  WBC 6.1  HGB 8.6*  HCT 28.2*  MCV 85.5  PLT 235   Cardiac Enzymes: No results for input(s): CKTOTAL, CKMB, CKMBINDEX, TROPONINI in the last 168 hours. BNP (last 3 results) No results for input(s): PROBNP in the last 8760 hours. CBG: Recent Labs  Lab 07/04/21 2107  GLUCAP 113*   D-Dimer: No results for input(s): DDIMER in the last 72 hours. Hgb A1c: No results for input(s): HGBA1C in the last 72 hours. Lipid Profile: No results for input(s): CHOL, HDL, LDLCALC, TRIG, CHOLHDL, LDLDIRECT in the last 72 hours. Thyroid function studies: No results for input(s): TSH, T4TOTAL, T3FREE, THYROIDAB in the last 72 hours.  Invalid input(s): FREET3 Anemia  work up: No results for input(s): VITAMINB12, FOLATE, FERRITIN, TIBC, IRON, RETICCTPCT in the last 72 hours. Sepsis Labs: Recent Labs  Lab 07/07/21 1110  WBC 6.1    Microbiology No results found for this or any previous visit (from the past 240 hour(s)).  Procedures and diagnostic studies:  No results found.             LOS: 35 days   [place] Copywriter, advertising on [email or web address]. If 7PM-7AM, please contact night-coverage at  [email or web address]     07/08/2021, 3:30 PM

## 2021-07-08 NOTE — Progress Notes (Signed)
OT Cancellation Note  Patient Details Name: Ryan [name] MRN: [id number] DOB: 1961/09/10   Cancelled Treatment:    Reason Eval/Treat Not Completed: Patient declined, no reason specified;Fatigue/lethargy limiting ability to participate. Upon attempt, pt sleeping, wakes briefly without opening eyes, declines OT despite encouragement. Agreeable to OT tx attempt next morning.   Ardeth Perfect., MPH, MS, OTR/L ascom [phone] 07/08/21, 4:15 PM

## 2021-07-08 NOTE — Progress Notes (Signed)
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  Pt recently here for multiple months with difficult d/c planning, did ultimatley d/c to homeless shelter and was gone only a few hours before fall with L ankle and wrist fractures with return to the ED.    PT Comments    Pt was asleep in bed upon arriving. He easily awakes and agrees to OOB. Encouraged pt to increase activity throughout the day. Reviewed there ex and pt performed prior to stand pivot to recliner with use of platform walker. Pt has poor ability to maintain NWB LLE/LUE. Reviewed importance of NWB for proper healing." It doesn't hurt though." Elected not to advance session due to inability to perform proper wt bearing. He was in recliner post session with call bell in reach and tray table placed in front of him. He will greatly benefit from SNF for continued skilled PT to improve independence however pt is difficult to place and may need to go to group home environment. Pt does not have a home to DC to.    Recommendations for follow up therapy are one component of a multi-disciplinary discharge planning process, led by the attending physician.  Recommendations may be updated based on patient status, additional functional criteria and insurance authorization.  Follow Up Recommendations  SNF;Supervision/Assistance - 24 hour     Equipment Recommendations  Rolling walker with 5" wheels;Wheelchair (measurements PT);Wheelchair cushion (measurements PT) (w/c if DC prior to NWB restriction removed)       Precautions / Restrictions Precautions Precautions: Fall Precaution Comments: NWB LUE/LLE Required Braces or  Orthoses: Splint/Cast Spinal Brace Comments: pt no longer using TLSO brace Splint/Cast: L ankle/LUE Restrictions Weight Bearing Restrictions: Yes LUE Weight Bearing: Non weight bearing LLE Weight Bearing: Non weight bearing     Mobility  Bed Mobility Overal bed mobility: Needs Assistance Bed Mobility: Supine to Sit;Sit to Supine Rolling: Supervision Sidelying to sit: Supervision Supine to sit: Supervision          Transfers Overall transfer level: Needs assistance Equipment used: Left platform walker Transfers: Sit to/from Stand Sit to Stand: Mod assist;From elevated surface Stand pivot transfers: Min assist       General transfer comment: pt stood from elevated bed height with mod assist + max vcs for proper wt ebaring. pt is unable to adhere and unwilling to try. explained needing to remain NWB to allow proper healing however on next STS pt still did not put much effort into NWB on LLE.  Ambulation/Gait         Gait velocity: decreased   General Gait Details: unable due to inability to maintain proper NWB in static standing   Balance Overall balance assessment: Needs assistance Sitting-balance support: No upper extremity supported Sitting balance-Leahy Scale: Good Sitting balance - Comments: G static sitting   Standing balance support: Bilateral upper extremity supported Standing balance-Leahy Scale: Poor     Cognition Arousal/Alertness: Awake/alert Behavior During Therapy: WFL for tasks assessed/performed Overall Cognitive Status: Within Functional Limits for tasks assessed      General Comments: A&O x 4, aware of NWB precautions however does not follow             Pertinent Vitals/Pain Pain  Assessment: No/denies pain Pain Score: 0-No pain     PT Goals (current goals can now be found in the care plan section) Acute Rehab PT Goals Patient Stated Goal: to get stronger Progress towards PT goals: Progressing toward goals    Frequency    Min  2X/week      PT Plan Current plan remains appropriate    Co-evaluation     PT goals addressed during session: Mobility/safety with mobility;Balance;Strengthening/ROM;Proper use of DME        AM-PAC PT "6 Clicks" Mobility   Outcome Measure  Help needed turning from your back to your side while in a flat bed without using bedrails?: A Little Help needed moving from lying on your back to sitting on the side of a flat bed without using bedrails?: A Little Help needed moving to and from a bed to a chair (including a wheelchair)?: A Lot Help needed standing up from a chair using your arms (e.g., wheelchair or bedside chair)?: A Lot Help needed to walk in hospital room?: Total Help needed climbing 3-5 steps with a railing? : Total 6 Click Score: 12    End of Session Equipment Utilized During Treatment: Gait belt Activity Tolerance: Patient tolerated treatment well Patient left: in chair;with call bell/phone within reach;with chair alarm set Nurse Communication: Mobility status PT Visit Diagnosis: Unsteadiness on feet (R26.81);Muscle weakness (generalized) (M62.81);Difficulty in walking, not elsewhere classified (R26.2);History of falling (Z91.81) Pain - Right/Left: Left Pain - part of body: Knee;Ankle and joints of foot     Time: 3005-1102 PT Time Calculation (min) (ACUTE ONLY): 17 min  Charges:  $Therapeutic Activity: 8-22 mins                     Julaine Fusi PTA 07/08/21, 3:14 PM

## 2021-07-09 MED ORDER — TUBERCULIN PPD 5 UNIT/0.1ML ID SOLN
5.0000 [IU] | Freq: Once | INTRADERMAL | Status: AC
  Administered 2021-07-09: 5 [IU] via INTRADERMAL
  Filled 2021-07-09: qty 0.1

## 2021-07-09 NOTE — Progress Notes (Addendum)
Progress Note    [name]  WJX:[id number] DOB: Sep 03, 1961  DOA: 06/02/2021 PCP: Pcp, No      Brief Narrative:    Medical records reviewed and are as summarized below:  Ryan [name] is a 60 y.o. 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 Patient was recently hospitalized from 6/21-8/24 due to metastasized prostate cancer. Pt was seen by [name] of oncology.  He was supposed to go to a homeless shelter but reportedly, he had issues when he was transported there.  He said he felt dizzy and fell accidentally causing severe pain in the left wrist and left ankle.  Work-up revealed fracture of the left distal radius and ulna and left distal tibial and fibular fractures.  He was treated with analgesics.  Orthopedic surgery recommended conservative management.      Assessment/Plan:   Principal Problem:   Closed left ankle fracture Active Problems:   Fall   Anemia of chronic disease   Protein-calorie malnutrition, severe (HCC)   Cancer cachexia ([place])   Prostate cancer metastatic to bone (HCC)   Left wrist fracture, closed, initial encounter   Hypocalcemia   Hyperkalemia   Closed fracture of left distal radius   Nutrition Problem: Severe Malnutrition Etiology: chronic illness, cancer and cancer related treatments  Signs/Symptoms: severe fat depletion, severe muscle depletion   Body mass index is 16.34 kg/m.   Closed left ankle fracture and left wrist fracture: Orthopedic surgery recommended conservative management.  Patient will be nonweightbearing left lower extremity.  Outpatient follow-up with orthopedic surgeon.  Stage IV prostate cancer with metastasis to the bone: PSA level has dropped from > 3000 (2 months ago) to 433 (1 month ago).  Outpatient follow-up with  oncologist.  Hypocalcemia: Continue calcium carbonate  Hyponatremia: Asymptomatic.  Chronic intermittent hypotension: Continue midodrine  Other comorbidities include anxiety, depression, cachexia, insomnia  Tuberculin skin test has been ordered for nursing home placement. Chest x-ray was requested as well but patient had a chest x-ray in July 2022 which did not show any new issues concerning for tuberculosis.  Additionally, he does not have any symptoms to suggest tuberculosis.  There is no need to repeat chest x-ray at this time.  Diet Order             Diet regular Room service appropriate? Yes; Fluid consistency: Thin  Diet effective now                      Consultants: Oncologist Orthopedic surgeon  Procedures: None    Medications:    calcium carbonate  1,250 mg Oral TID WC   feeding supplement  237 mL Oral QID   magnesium oxide  400 mg Oral Daily   midodrine  5 mg Oral TID WC   mirtazapine  15 mg Oral QHS   multivitamin with minerals  1 tablet Oral Daily   nicotine  21 mg Transdermal Daily   rivaroxaban  10 mg Oral Q supper   senna-docusate  1 tablet Oral BID   tuberculin  5 Units Intradermal Once   Continuous Infusions:  sodium chloride Stopped (06/04/21 1216)     Anti-infectives (From admission, onward)    None              Family Communication/Anticipated D/C date and plan/Code Status  DVT prophylaxis: rivaroxaban (XARELTO) tablet 10 mg Start: 06/29/21 2215 rivaroxaban (XARELTO) tablet 10 mg     Code Status: DNR  Family Communication: None Disposition Plan:    Status is: Inpatient  Remains inpatient appropriate because:Unsafe d/c plan  Dispo: The patient is from: Home              Anticipated d/c is to: SNF              Patient currently is medically stable to d/c.   Difficult to place patient Yes           Subjective:   Interval events noted.  No new complaints.  No dizziness, shortness of breath or chest  pain.  No pain in the extremities.  Objective:    Vitals:   07/08/21 1954 07/09/21 0617 07/09/21 0757 07/09/21 1126  BP: (!) 97/57 (!) 101/47 (!) 99/56 (!) 90/58  Pulse: 96 (!) 101 100 100  Resp: 18 18 14 16   Temp: 98.1 F (36.7 C) 98.2 F (36.8 C) 98.2 F (36.8 C) 98.3 F (36.8 C)  TempSrc:  Oral    SpO2: 98% 95% 93% 96%  Weight:      Height:       No data found.   Intake/Output Summary (Last 24 hours) at 07/09/2021 1439 Last data filed at 07/09/2021 1027 Gross per 24 hour  Intake 240 ml  Output 575 ml  Net -335 ml   Filed Weights   06/23/21 1545 07/06/21 1406 07/07/21 1443  Weight: 62.6 kg 65.6 kg 62.5 kg    Exam:  GEN: NAD SKIN: No rash EYES: EOMI ENT: MMM CV: RRR PULM: CTA B ABD: soft, ND, NT, +BS CNS: AAO x 3, non focal EXT: Dressing on her left hand on the left lower extremity is clean, dry and intact.        Data Reviewed:   I have personally reviewed following labs and imaging studies:  Labs: Labs show the following:   Basic Metabolic Panel: Recent Labs  Lab 07/07/21 0848  NA 132*  K 4.4  CL 104  CO2 20*  GLUCOSE 129*  BUN 11  CREATININE 0.65  CALCIUM 8.4*  MG 1.7  PHOS 3.8   GFR Estimated Creatinine Clearance: 87.9 mL/min (by C-G formula based on SCr of 0.65 mg/dL). Liver Function Tests: No results for input(s): AST, ALT, ALKPHOS, BILITOT, PROT, ALBUMIN in the last 168 hours. No results for input(s): LIPASE, AMYLASE in the last 168 hours. No results for input(s): AMMONIA in the last 168 hours. Coagulation profile No results for input(s): INR, PROTIME in the last 168 hours.  CBC: Recent Labs  Lab 07/07/21 1110  WBC 6.1  HGB 8.6*  HCT 28.2*  MCV 85.5  PLT 235   Cardiac Enzymes: No results for input(s): CKTOTAL, CKMB, CKMBINDEX, TROPONINI in the last 168 hours. BNP (last 3 results) No results for input(s): PROBNP in the last 8760 hours. CBG: Recent Labs  Lab 07/04/21 2107  GLUCAP 113*   D-Dimer: No results  for input(s): DDIMER in the last 72 hours. Hgb A1c: No results for input(s): HGBA1C in the last 72 hours. Lipid Profile: No results for input(s): CHOL, HDL, LDLCALC, TRIG, CHOLHDL, LDLDIRECT in the last 72 hours. Thyroid function studies: No results for input(s): TSH, T4TOTAL, T3FREE, THYROIDAB in the last 72 hours.  Invalid input(s): FREET3 Anemia work up: No results for input(s): VITAMINB12, FOLATE, FERRITIN, TIBC, IRON, RETICCTPCT in the last 72 hours. Sepsis Labs: Recent Labs  Lab  07/07/21 1110  WBC 6.1    Microbiology No results found for this or any previous visit (from the past 240 hour(s)).  Procedures and diagnostic studies:  No results found.             LOS: 36 days   [place] Copywriter, advertising on [email or web address]. If 7PM-7AM, please contact night-coverage at [email or web address]     07/09/2021, 2:39 PM

## 2021-07-09 NOTE — Progress Notes (Signed)
Tuberculin injection administered on right forearm at 1534, please read in 48 hours, on June 11, 2021 @ 1534.

## 2021-07-09 NOTE — Progress Notes (Signed)
Patient called to inquire about four bags of personal belongings that were left in the transport vehicle for his discharge on 06/02/21.  Spoke with Suezanne Jacquet with [employer].  Suezanne Jacquet was able to track down the ride record and the driver's information.  He will submit [employer] of the concern so they can investigate and follow up directly with the patient.    Called and explained this to patient.  He is aware someone from [employer] should be reaching out to him.

## 2021-07-09 NOTE — TOC Progression Note (Signed)
Transition of Care [facility]) - Progression Note    Patient Details  Name: Ryan [name] MRN: [id number] Date of Birth: [birth year]  [facility] of [facility]) CM/SW Contact  Kerin Salen, RN Phone Number: 07/09/2021, 4:53 PM  Clinical Narrative:  Delana Meyer from [facility], [phone] called and approve admission, patient will need TB Skin test, Covid test and responsible party to sign paperwork. Called aunt, Kirby Funk who agrees to sign paperwork and voices appreciation of assistance given to the care of patient.Attending notified, TB test to be placed today and read in 48hrs. If negative can arrange for admission, call [facility].         Expected Discharge Plan and Services                                                 Social Determinants of Health (SDOH) Interventions    Readmission Risk Interventions Readmission Risk Prevention Plan 04/23/2021  Transportation Screening Complete  PCP or Specialist Appt within 3-5 Days Complete  HRI or [facility] Complete  Social Work Consult for [place] Planning/Counseling Not Complete  SW consult not completed comments RNCM assigned to patient  Palliative Care Screening Complete  Medication Review Press photographer) Complete  Some recent data might be hidden

## 2021-07-09 NOTE — Progress Notes (Signed)
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  Pt recently here for multiple months with difficult d/c planning, did ultimatley d/c to homeless shelter and was gone only a few hours before fall with L ankle and wrist fractures with return to the ED.    PT Comments    Pt received supine in bed. States he just returned to bed from chair and was agreeable to therex in bed only. Pt performed 2x10 reps of each LE exercise with mild muscular fatigue noted. Pt is aware of NWB status of LLE however does not follow precautions with rolling and repositioning in bed. Light manual resistance was provided for hip abduction in hook lying. Pt reported wanting to perform on his own; PT delivered yellow RB. Would benefit from skilled PT to address above deficits and promote optimal return to PLOF.   Recommendations for follow up therapy are one component of a multi-disciplinary discharge planning process, led by the attending physician.  Recommendations may be updated based on patient status, additional functional criteria and insurance authorization.  Follow Up Recommendations  SNF;Supervision/Assistance - 24 hour     Equipment Recommendations  Rolling walker with 5" wheels;Wheelchair (measurements PT);Wheelchair cushion (measurements PT) (w/c if DC prior to NWB restriction removed)    Recommendations for Other Services       Precautions / Restrictions Precautions Precautions: Fall Precaution Comments: NWB LUE/LLE Required Braces or Orthoses: Splint/Cast Spinal Brace Comments: pt no longer using TLSO brace Splint/Cast: L ankle/LUE Splint/Cast - Date Prophylactic Dressing Applied  (if applicable): 90/30/09 Other Brace: pt no longer using LLE post-op shoe Restrictions Weight Bearing Restrictions: Yes LUE Weight Bearing: Non weight bearing LLE Weight Bearing: Non weight bearing Other Position/Activity Restrictions: NWB LUE/LLE, LUE platform walker in room     Mobility  Bed Mobility Overal bed mobility: Needs Assistance Bed Mobility: Rolling Rolling: Supervision         General bed mobility comments: B rolling for therex - does not follow NWB LLE    Transfers                 General transfer comment: Refused OOB mobility due to just returning to bed  Ambulation/Gait                 Stairs             Wheelchair Mobility    Modified Rankin (Stroke Patients Only)       Balance                                            Cognition Arousal/Alertness: Awake/alert Behavior During Therapy: WFL for tasks assessed/performed Overall Cognitive Status: Within Functional Limits for tasks assessed                                 General Comments: Aware of NWB precautions however does not follow      Exercises Total Joint Exercises Quad Sets: Strengthening;Both;20 reps;Supine Gluteal Sets: Both;Supine;Strengthening;20 reps Hip ABduction/ADduction: AROM;20 reps;Supine;Strengthening;Both (manual resistance for ABD  and pillow for ADD) Straight Leg Raises: Strengthening;Both;20 reps;Supine Bridges: Strengthening;Right;20 reps;Supine    General Comments        Pertinent Vitals/Pain Pain Assessment: No/denies pain    Home Living                      Prior Function            PT Goals (current goals can now be found in the care plan section) Acute Rehab PT Goals Patient Stated Goal: to get stronger    Frequency    Min 2X/week      PT Plan      Co-evaluation              AM-PAC PT "6 Clicks" Mobility   Outcome Measure  Help needed turning from your back to your  side while in a flat bed without using bedrails?: A Little Help needed moving from lying on your back to sitting on the side of a flat bed without using bedrails?: A Little Help needed moving to and from a bed to a chair (including a wheelchair)?: A Lot Help needed standing up from a chair using your arms (e.g., wheelchair or bedside chair)?: A Lot Help needed to walk in hospital room?: Total Help needed climbing 3-5 steps with a railing? : Total 6 Click Score: 12    End of Session   Activity Tolerance: Patient tolerated treatment well Patient left: with call bell/phone within reach;in bed;with bed alarm set Nurse Communication: Mobility status PT Visit Diagnosis: Unsteadiness on feet (R26.81);Muscle weakness (generalized) (M62.81);Difficulty in walking, not elsewhere classified (R26.2);History of falling (Z91.81) Pain - Right/Left: Left Pain - part of body: Knee;Ankle and joints of foot     Time: 1007-1219 PT Time Calculation (min) (ACUTE ONLY): 24 min  Charges:  $Therapeutic Exercise: 23-37 mins                     Patrina Levering PT, DPT 07/09/21 12:32 PM [phone]    [facility] 07/09/2021, 12:28 PM

## 2021-07-09 NOTE — Progress Notes (Signed)
OT Cancellation Note  Patient Details Name: Ryan [name] MRN: [id number] DOB: [birth year]   Cancelled Treatment:    Reason Eval/Treat Not Completed: Other (comment). Pt with nursing for care. Will re-attempt OT tx at later date/time as schedule permits.   Ardeth Perfect., MPH, MS, OTR/L ascom [phone] 07/09/21, 3:51 PM

## 2021-07-10 NOTE — Progress Notes (Signed)
Progress Note    Ryan [name]  JJO:[id number] DOB: 1961/02/01  DOA: 06/02/2021 PCP: Pcp, No      Brief Narrative:    Medical records reviewed and are as summarized below:  Ryan [name] is a 60 y.o. 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 Patient was recently hospitalized from 6/21-8/24 due to metastasized prostate cancer. Pt was seen by [name] of oncology.  He was supposed to go to a homeless shelter but reportedly, he had issues when he was transported there.  He said he felt dizzy and fell accidentally causing severe pain in the left wrist and left ankle.  Work-up revealed fracture of the left distal radius and ulna and left distal tibial and fibular fractures.  He was treated with analgesics.  Orthopedic surgery recommended conservative management.      Assessment/Plan:   Principal Problem:   Closed left ankle fracture Active Problems:   Fall   Anemia of chronic disease   Protein-calorie malnutrition, severe (HCC)   Cancer cachexia ([place])   Prostate cancer metastatic to bone (HCC)   Left wrist fracture, closed, initial encounter   Hypocalcemia   Hyperkalemia   Closed fracture of left distal radius   Nutrition Problem: Severe Malnutrition Etiology: chronic illness, cancer and cancer related treatments  Signs/Symptoms: severe fat depletion, severe muscle depletion   Body mass index is 16.34 kg/m.   Closed left ankle fracture and left wrist fracture: Orthopedic surgery recommended conservative management.  Patient will be nonweightbearing left lower extremity.  Outpatient follow-up with orthopedic surgeon.  Stage IV prostate cancer with metastasis to the bone: PSA level has dropped from > 3000 (2 months ago) to 433 (1 month ago).  Outpatient follow-up with  oncologist.  Hypocalcemia: Continue calcium carbonate  Hyponatremia: Asymptomatic.  Chronic intermittent hypotension: Continue midodrine  Other comorbidities include anemia of chronic disease, anxiety, depression, cachexia, insomnia  Tuberculin skin test was done on 07/09/2021 as a requirement for nursing home placement. Chest x-ray was requested as well but patient had a chest x-ray in July 2022 which did not show any changes concerning for tuberculosis.  Additionally, he does not have any symptoms to suggest tuberculosis.  There is no need to repeat chest x-ray at this time.  Diet Order             Diet regular Room service appropriate? Yes; Fluid consistency: Thin  Diet effective now                      Consultants: Oncologist Orthopedic surgeon  Procedures: None    Medications:    calcium carbonate  1,250 mg Oral TID WC   feeding supplement  237 mL Oral QID   magnesium oxide  400 mg Oral Daily   midodrine  5 mg Oral TID WC   mirtazapine  15 mg Oral QHS   multivitamin with minerals  1 tablet Oral Daily   nicotine  21 mg Transdermal Daily   rivaroxaban  10 mg Oral Q supper   senna-docusate  1 tablet Oral BID   tuberculin  5 Units Intradermal Once   Continuous Infusions:  sodium chloride Stopped (06/04/21 1216)     Anti-infectives (From admission, onward)    None              Family Communication/Anticipated  D/C date and plan/Code Status   DVT prophylaxis: rivaroxaban (XARELTO) tablet 10 mg Start: 06/29/21 2215 rivaroxaban (XARELTO) tablet 10 mg     Code Status: DNR  Family Communication: None Disposition Plan:    Status is: Inpatient  Remains inpatient appropriate because:Unsafe d/c plan  Dispo: The patient is from: Home              Anticipated d/c is to: SNF              Patient currently is medically stable to d/c.   Difficult to place patient Yes           Subjective:   No complaints.  Interval events noted.  His  nurse was at the bedside.  Objective:    Vitals:   07/09/21 2345 07/10/21 0447 07/10/21 0751 07/10/21 1123  BP: (!) 102/56 (!) 102/59 (!) 95/49 (!) 100/54  Pulse: (!) 104 99 99 100  Resp: 18 18 15 15   Temp: 97.9 F (36.6 C) 98 F (36.7 C) 98.2 F (36.8 C) 98.2 F (36.8 C)  TempSrc:  Oral    SpO2: 94% 96% 97% 97%  Weight:      Height:       No data found.   Intake/Output Summary (Last 24 hours) at 07/10/2021 1220 Last data filed at 07/10/2021 1037 Gross per 24 hour  Intake 0 ml  Output 900 ml  Net -900 ml   Filed Weights   06/23/21 1545 07/06/21 1406 07/07/21 1443  Weight: 62.6 kg 65.6 kg 62.5 kg    Exam:  GEN: NAD SKIN: No rash EYES: EOMI ENT: MMM CV: RRR PULM: CTA B ABD: soft, ND, NT, +BS CNS: AAO x 3, non focal EXT: Dressing on left hand and left leg is clean, dry and intact.  No tenderness or swelling.         Data Reviewed:   I have personally reviewed following labs and imaging studies:  Labs: Labs show the following:   Basic Metabolic Panel: Recent Labs  Lab 07/07/21 0848  NA 132*  K 4.4  CL 104  CO2 20*  GLUCOSE 129*  BUN 11  CREATININE 0.65  CALCIUM 8.4*  MG 1.7  PHOS 3.8   GFR Estimated Creatinine Clearance: 87.9 mL/min (by C-G formula based on SCr of 0.65 mg/dL). Liver Function Tests: No results for input(s): AST, ALT, ALKPHOS, BILITOT, PROT, ALBUMIN in the last 168 hours. No results for input(s): LIPASE, AMYLASE in the last 168 hours. No results for input(s): AMMONIA in the last 168 hours. Coagulation profile No results for input(s): INR, PROTIME in the last 168 hours.  CBC: Recent Labs  Lab 07/07/21 1110  WBC 6.1  HGB 8.6*  HCT 28.2*  MCV 85.5  PLT 235   Cardiac Enzymes: No results for input(s): CKTOTAL, CKMB, CKMBINDEX, TROPONINI in the last 168 hours. BNP (last 3 results) No results for input(s): PROBNP in the last 8760 hours. CBG: Recent Labs  Lab 07/04/21 2107  GLUCAP 113*   D-Dimer: No results for  input(s): DDIMER in the last 72 hours. Hgb A1c: No results for input(s): HGBA1C in the last 72 hours. Lipid Profile: No results for input(s): CHOL, HDL, LDLCALC, TRIG, CHOLHDL, LDLDIRECT in the last 72 hours. Thyroid function studies: No results for input(s): TSH, T4TOTAL, T3FREE, THYROIDAB in the last 72 hours.  Invalid input(s): FREET3 Anemia work up: No results for input(s): VITAMINB12, FOLATE, FERRITIN, TIBC, IRON, RETICCTPCT in the last 72 hours. Sepsis Labs: Recent Labs  Lab 07/07/21 1110  WBC 6.1    Microbiology No results found for this or any previous visit (from the past 240 hour(s)).  Procedures and diagnostic studies:  No results found.             LOS: 37 days   [place] Copywriter, advertising on [email or web address]. If 7PM-7AM, please contact night-coverage at [email or web address]     07/10/2021, 12:20 PM

## 2021-07-11 MED ORDER — TRAZODONE HCL 50 MG PO TABS
150.0000 mg | ORAL_TABLET | Freq: Every evening | ORAL | Status: DC | PRN
  Administered 2021-07-12 – 2021-08-08 (×29): 150 mg via ORAL
  Filled 2021-07-11 (×30): qty 1

## 2021-07-11 NOTE — Progress Notes (Signed)
Progress Note    Ryan [name]  PPI:[id number] DOB: September 30, 1961  DOA: 06/02/2021 PCP: Pcp, No      Brief Narrative:    Medical records reviewed and are as summarized below:  Ryan [name] is a 60 y.o. 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 Patient was recently hospitalized from 6/21-8/24 due to metastasized prostate cancer. Pt was seen by [name] of oncology.  He was supposed to go to a homeless shelter but reportedly, he had issues when he was transported there.  He said he felt dizzy and fell accidentally causing severe pain in the left wrist and left ankle.  Work-up revealed fracture of the left distal radius and ulna and left distal tibial and fibular fractures.  He was treated with analgesics.  Orthopedic surgery recommended conservative management.      Assessment/Plan:   Principal Problem:   Closed left ankle fracture Active Problems:   Fall   Anemia of chronic disease   Protein-calorie malnutrition, severe (HCC)   Cancer cachexia ([place])   Prostate cancer metastatic to bone (HCC)   Left wrist fracture, closed, initial encounter   Hypocalcemia   Hyperkalemia   Closed fracture of left distal radius   Nutrition Problem: Severe Malnutrition Etiology: chronic illness, cancer and cancer related treatments  Signs/Symptoms: severe fat depletion, severe muscle depletion   Body mass index is 16.34 kg/m.   Closed left ankle fracture and left wrist fracture: Orthopedic surgery recommended conservative management.  Patient will be nonweightbearing left lower extremity.  Outpatient follow-up with orthopedic surgeon.  Stage IV prostate cancer with metastasis to the bone: PSA level has dropped from > 3000 (2 months ago) to 433 (1 month ago).  Outpatient follow-up with  oncologist.  Hypocalcemia: Continue calcium carbonate  Hyponatremia: Asymptomatic  Chronic intermittent hypotension: Continue midodrine  Other comorbidities include anemia of chronic disease, anxiety, depression, cachexia, insomnia  Tuberculin skin test was done on 07/09/2021 as a requirement for nursing home placement. Chest x-ray was requested as well but patient had a chest x-ray in July 2022 which did not show any changes concerning for tuberculosis.  Additionally, he does not have any symptoms to suggest tuberculosis.  There is no need to repeat chest x-ray at this time.  Awaiting placement to SNF  Diet Order             Diet regular Room service appropriate? Yes; Fluid consistency: Thin  Diet effective now                      Consultants: Oncologist Orthopedic surgeon  Procedures: None    Medications:    calcium carbonate  1,250 mg Oral TID WC   feeding supplement  237 mL Oral QID   magnesium oxide  400 mg Oral Daily   midodrine  5 mg Oral TID WC   mirtazapine  15 mg Oral QHS   multivitamin with minerals  1 tablet Oral Daily   nicotine  21 mg Transdermal Daily   rivaroxaban  10 mg Oral Q supper   senna-docusate  1 tablet Oral BID   tuberculin  5 Units Intradermal Once   Continuous Infusions:  sodium chloride Stopped (06/04/21 1216)     Anti-infectives (From admission, onward)    None  Family Communication/Anticipated D/C date and plan/Code Status   DVT prophylaxis: rivaroxaban (XARELTO) tablet 10 mg Start: 06/29/21 2215 rivaroxaban (XARELTO) tablet 10 mg     Code Status: DNR  Family Communication: None Disposition Plan:    Status is: Inpatient  Remains inpatient appropriate because:Unsafe d/c plan  Dispo: The patient is from: Home              Anticipated d/c is to: SNF              Patient currently is medically stable to d/c.   Difficult to place patient Yes           Subjective:   Interval events  noted.  He has no complaints.  Objective:    Vitals:   07/10/21 2156 07/11/21 0514 07/11/21 0842 07/11/21 1212  BP: 104/63 (!) 112/50 (!) 111/54 (!) 115/51  Pulse: 100 (!) 105 100 99  Resp: 16 16 18 18   Temp: 98.4 F (36.9 C) 97.6 F (36.4 C) 98.2 F (36.8 C) 98.2 F (36.8 C)  TempSrc:    Oral  SpO2: 96% 95% 98% 98%  Weight:      Height:       No data found.   Intake/Output Summary (Last 24 hours) at 07/11/2021 1346 Last data filed at 07/11/2021 1011 Gross per 24 hour  Intake 0 ml  Output 800 ml  Net -800 ml   Filed Weights   06/23/21 1545 07/06/21 1406 07/07/21 1443  Weight: 62.6 kg 65.6 kg 62.5 kg    Exam:  GEN: NAD SKIN: No rash EYES: EOMI ENT: MMM CV: RRR PULM: CTA B ABD: soft, ND, NT, +BS CNS: AAO x 3, non focal EXT: No edema or tenderness.  Dressing on the left upper and lower extremities are intact.       Data Reviewed:   I have personally reviewed following labs and imaging studies:  Labs: Labs show the following:   Basic Metabolic Panel: Recent Labs  Lab 07/07/21 0848  NA 132*  K 4.4  CL 104  CO2 20*  GLUCOSE 129*  BUN 11  CREATININE 0.65  CALCIUM 8.4*  MG 1.7  PHOS 3.8   GFR Estimated Creatinine Clearance: 87.9 mL/min (by C-G formula based on SCr of 0.65 mg/dL). Liver Function Tests: No results for input(s): AST, ALT, ALKPHOS, BILITOT, PROT, ALBUMIN in the last 168 hours. No results for input(s): LIPASE, AMYLASE in the last 168 hours. No results for input(s): AMMONIA in the last 168 hours. Coagulation profile No results for input(s): INR, PROTIME in the last 168 hours.  CBC: Recent Labs  Lab 07/07/21 1110  WBC 6.1  HGB 8.6*  HCT 28.2*  MCV 85.5  PLT 235   Cardiac Enzymes: No results for input(s): CKTOTAL, CKMB, CKMBINDEX, TROPONINI in the last 168 hours. BNP (last 3 results) No results for input(s): PROBNP in the last 8760 hours. CBG: Recent Labs  Lab 07/04/21 2107  GLUCAP 113*   D-Dimer: No results for  input(s): DDIMER in the last 72 hours. Hgb A1c: No results for input(s): HGBA1C in the last 72 hours. Lipid Profile: No results for input(s): CHOL, HDL, LDLCALC, TRIG, CHOLHDL, LDLDIRECT in the last 72 hours. Thyroid function studies: No results for input(s): TSH, T4TOTAL, T3FREE, THYROIDAB in the last 72 hours.  Invalid input(s): FREET3 Anemia work up: No results for input(s): VITAMINB12, FOLATE, FERRITIN, TIBC, IRON, RETICCTPCT in the last 72 hours. Sepsis Labs: Recent Labs  Lab 07/07/21 1110  WBC 6.1  Microbiology No results found for this or any previous visit (from the past 240 hour(s)).  Procedures and diagnostic studies:  No results found.             LOS: 38 days   [place] Copywriter, advertising on [email or web address]. If 7PM-7AM, please contact night-coverage at [email or web address]     07/11/2021, 1:46 PM

## 2021-07-12 ENCOUNTER — Other Ambulatory Visit: Payer: Self-pay

## 2021-07-12 LAB — RESP PANEL BY RT-PCR (FLU A&B, COVID) ARPGX2
Influenza A by PCR: NEGATIVE
Influenza B by PCR: NEGATIVE
SARS Coronavirus 2 by RT PCR: NEGATIVE

## 2021-07-12 MED ORDER — MAGNESIUM OXIDE -MG SUPPLEMENT 400 (240 MG) MG PO TABS: 400.0000 mg | ORAL_TABLET | Freq: Every day | ORAL | Status: DC

## 2021-07-12 MED ORDER — MIDODRINE HCL 5 MG PO TABS
5.0000 mg | ORAL_TABLET | Freq: Three times a day (TID) | ORAL | 1 refills | Status: DC
  Filled 2021-07-12: qty 90, 30d supply, fill #0

## 2021-07-12 NOTE — Progress Notes (Signed)
OT Cancellation Note  Patient Details Name: Ryan [name] MRN: [id number] DOB: 17-Oct-1960   Cancelled Treatment:    Reason Eval/Treat Not Completed: Patient declined, no reason specified. Upon attempt, pt declining to participate, requesting to sleep first and agreeable to OT re-attempting later. Will re-attempt as schedule permits.   Ardeth Perfect., MPH, MS, OTR/L ascom [phone] 07/12/21, 10:15 AM

## 2021-07-12 NOTE — NC FL2 (Addendum)
[place] LEVEL OF CARE SCREENING TOOL     IDENTIFICATION  Patient Name: Ryan [name] Birthdate: 1961-08-21 Sex: male Admission Date (Current Location): 06/02/2021  [facility] and [place] Number:  Engineering geologist and Address:  [facility], [address] [place], [place] [address]      Provider Number: [id number]  Attending Physician Name and Address:  Jennye Boroughs, MD  Relative Name and Phone Number:  Fredrich Romans [phone]    Current Level of Care: Hospital Recommended Level of Care: Assisted Living facility Prior Approval Number:    Date Approved/Denied:   PASRR Number: [id number]  Discharge Plan: Other (Comment) ([place])    Current Diagnoses: Patient Active Problem List   Diagnosis Date Noted   Closed fracture of left distal radius    Closed left ankle fracture 06/03/2021   Left wrist fracture, closed, initial encounter 06/03/2021   Hypocalcemia 06/03/2021   Hyperkalemia 06/03/2021   Hypotension 04/07/2021   Prostate cancer metastatic to bone ([place]) 04/06/2021   Anemia of chronic disease 03/30/2021   Thrombocytopenia ([place]) 03/30/2021   Thoracic compression fracture, closed, initial encounter ([place]) 03/30/2021   Pathologic fracture of thoracic vertebrae, initial encounter 03/30/2021   Malnourished ([place]) 03/30/2021   Protein-calorie malnutrition, severe ([place]) 03/30/2021   Cancer cachexia ([place]) 03/30/2021   Infestation by bed bug 03/30/2021   Fall    Transaminitis    Pain    Closed right hip fracture ([place]) 04/01/2020   Symptomatic anemia 04/01/2020   Hyponatremia 04/01/2020   Closed rib fracture 04/01/2020   Leukocytosis 04/01/2020   Rhabdomyolysis 04/01/2020   Dehydration 04/01/2020    Orientation RESPIRATION BLADDER Height & Weight     Self, Time, Situation, Place  Normal Continent Weight: 62.5 kg Height:  6\' 5"  (195.6 cm)  BEHAVIORAL SYMPTOMS/MOOD NEUROLOGICAL BOWEL NUTRITION STATUS       Continent Diet (regular)  AMBULATORY STATUS COMMUNICATION OF NEEDS Skin   Limited Assist Verbally Normal                       Personal Care Assistance Level of Assistance  Bathing, Dressing Bathing Assistance: Limited assistance   Dressing Assistance: Limited assistance     Functional Limitations Info             SPECIAL CARE FACTORS FREQUENCY  PT (By licensed PT), OT (By licensed OT)     PT Frequency: 3 times per week OT Frequency: 3 times per week            Contractures Contractures Info: Not present    Additional Factors Info  Code Status, Allergies Code Status Info: DNR Allergies Info: NKDA           Current Medications (07/12/2021):  This is the current hospital active medication list Current Facility-Administered Medications  Medication Dose Route Frequency Provider Last Rate Last Admin   0.9 %  sodium chloride infusion   Intravenous PRN Lorella Nimrod, MD   Stopped at 06/04/21 1216   acetaminophen (TYLENOL) 160 MG/5ML solution 650 mg  650 mg Oral Q6H PRN Ivor Costa, MD       calcium carbonate (OS-CAL - dosed in mg of elemental calcium) tablet 1,250 mg  1,250 mg Oral TID WC Ivor Costa, MD   1,250 mg at 07/12/21 1106   feeding supplement (ENSURE ENLIVE / ENSURE PLUS) liquid 237 mL  237 mL Oral QID Lorella Nimrod, MD   237 mL at 07/12/21 0814   hydrOXYzine (ATARAX/VISTARIL)  tablet 25 mg  25 mg Oral TID PRN Ivor Costa, MD   25 mg at 06/07/21 0545   magnesium oxide (MAG-OX) tablet 400 mg  400 mg Oral Daily Enzo Bi, MD   400 mg at 07/12/21 0815   midodrine (PROAMATINE) tablet 5 mg  5 mg Oral TID WC Kathie Dike, MD   5 mg at 07/12/21 1106   mirtazapine (REMERON) tablet 15 mg  15 mg Oral QHS Ivor Costa, MD   15 mg at 07/11/21 2336   multivitamin with minerals tablet 1 tablet  1 tablet Oral Daily Lorella Nimrod, MD   1 tablet at 07/12/21 0815   nicotine (NICODERM CQ - dosed in mg/24 hours) patch 21 mg  21 mg Transdermal Daily Ivor Costa, MD   21 mg at  07/10/21 0833   ondansetron (ZOFRAN) injection 4 mg  4 mg Intravenous Q8H PRN Ivor Costa, MD   4 mg at 06/25/21 0556   ondansetron (ZOFRAN-ODT) disintegrating tablet 4 mg  4 mg Oral Q8H PRN Enzo Bi, MD       rivaroxaban Alveda Reasons) tablet 10 mg  10 mg Oral Q supper Rauer, Forde Dandy, RPH   10 mg at 07/11/21 1708   senna-docusate (Senokot-S) tablet 1 tablet  1 tablet Oral BID Ralene Muskrat B, MD   1 tablet at 07/12/21 0815   traMADol (ULTRAM) tablet 50 mg  50 mg Oral Q6H PRN Enzo Bi, MD   50 mg at 07/11/21 2336   traZODone (DESYREL) tablet 150 mg  150 mg Oral QHS PRN Jennye Boroughs, MD   150 mg at 07/12/21 1308     Discharge Medications: Medication List       STOP taking these medications     dexamethasone 4 MG tablet Commonly known as: DECADRON    feeding supplement Liqd    methocarbamol 500 MG tablet Commonly known as: ROBAXIN           TAKE these medications     hydrOXYzine 25 MG tablet Commonly known as: ATARAX/VISTARIL Take 1 tablet (25 mg total) by mouth 3 (three) times daily as needed for anxiety.    magnesium oxide 400 (240 Mg) MG tablet Commonly known as: MAG-OX Take 1 tablet (400 mg total) by mouth daily. Start taking on: July 13, 2021    midodrine 5 MG tablet Commonly known as: PROAMATINE Take 1 tablet (5 mg total) by mouth 3 (three) times daily with meals. What changed: how much to take    mirtazapine 15 MG tablet Commonly known as: REMERON Take 1 tablet (15 mg total) by mouth once daily at bedtime.          Relevant Imaging Results:  Relevant Lab Results:   Additional Information SS# [id number]  Su Hilt, RN

## 2021-07-12 NOTE — Progress Notes (Signed)
Progress Note    Ryan [name]  SEG:[id number] DOB: Mar 05, 1961  DOA: 06/02/2021 PCP: Pcp, No      Brief Narrative:    Medical records reviewed and are as summarized below:  Ryan [name] is a 60 y.o. 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 Patient was recently hospitalized from 6/21-8/24 due to metastasized prostate cancer. Pt was seen by [name] of oncology.  He was supposed to go to a homeless shelter but reportedly, he had issues when he was transported there.  He said he felt dizzy and fell accidentally causing severe pain in the left wrist and left ankle.  Work-up revealed fracture of the left distal radius and ulna and left distal tibial and fibular fractures.  He was treated with analgesics.  Orthopedic surgery recommended conservative management.      Assessment/Plan:   Principal Problem:   Closed left ankle fracture Active Problems:   Fall   Anemia of chronic disease   Protein-calorie malnutrition, severe (HCC)   Cancer cachexia ([place])   Prostate cancer metastatic to bone (HCC)   Left wrist fracture, closed, initial encounter   Hypocalcemia   Hyperkalemia   Closed fracture of left distal radius   Nutrition Problem: Severe Malnutrition Etiology: chronic illness, cancer and cancer related treatments  Signs/Symptoms: severe fat depletion, severe muscle depletion   Body mass index is 16.34 kg/m.   Closed left ankle fracture and left wrist fracture: Orthopedic surgery recommended conservative management.  Patient will be nonweightbearing left lower extremity.  Follow-up with orthopedic surgeon as an outpatient.  Stage IV prostate cancer with metastasis to the bone: PSA level has dropped from > 3000 (2 months ago) to 433 (1 month ago).  Outpatient follow-up with  oncologist.  Hypocalcemia: Continue calcium carbonate  Hyponatremia: Asymptomatic  Chronic intermittent hypotension: Continue midodrine  Other comorbidities include anemia of chronic disease, anxiety, depression, cachexia, insomnia  Tuberculin skin test was done on 07/09/2021 and this was negative.  Chest x-ray was requested as well but patient had a chest x-ray in July 2022 which did not show any changes concerning for tuberculosis.  Additionally, he does not have any symptoms to suggest tuberculosis.  There is no need to repeat chest x-ray at this time. Screening COVID test has been ordered for nursing home placement. Awaiting placement to SNF  Diet Order             Diet regular Room service appropriate? Yes; Fluid consistency: Thin  Diet effective now                      Consultants: Oncologist Orthopedic surgeon  Procedures: None    Medications:    calcium carbonate  1,250 mg Oral TID WC   feeding supplement  237 mL Oral QID   magnesium oxide  400 mg Oral Daily   midodrine  5 mg Oral TID WC   mirtazapine  15 mg Oral QHS   multivitamin with minerals  1 tablet Oral Daily   nicotine  21 mg Transdermal Daily   rivaroxaban  10 mg Oral Q supper   senna-docusate  1 tablet Oral BID   Continuous Infusions:  sodium chloride Stopped (06/04/21 1216)     Anti-infectives (From admission, onward)    None  Family Communication/Anticipated D/C date and plan/Code Status   DVT prophylaxis: rivaroxaban (XARELTO) tablet 10 mg Start: 06/29/21 2215 rivaroxaban (XARELTO) tablet 10 mg     Code Status: DNR  Family Communication: None Disposition Plan:    Status is: Inpatient  Remains inpatient appropriate because:Unsafe d/c plan  Dispo: The patient is from: Home              Anticipated d/c is to: SNF              Patient currently is medically stable to d/c.   Difficult to place patient Yes           Subjective:   Interval  events noted.  He has no complaints.  Objective:    Vitals:   07/11/21 1936 07/12/21 0405 07/12/21 0840 07/12/21 1154  BP: (!) 101/50 (!) 98/55 108/62 (!) 108/57  Pulse: 94 100 (!) 101 99  Resp: 18 18 16 16   Temp: 98.7 F (37.1 C) 98.7 F (37.1 C) 97.8 F (36.6 C) 97.9 F (36.6 C)  TempSrc:  Oral    SpO2: 96% 96% 92% 97%  Weight:      Height:       No data found.   Intake/Output Summary (Last 24 hours) at 07/12/2021 1343 Last data filed at 07/12/2021 1033 Gross per 24 hour  Intake 0 ml  Output 250 ml  Net -250 ml   Filed Weights   06/23/21 1545 07/06/21 1406 07/07/21 1443  Weight: 62.6 kg 65.6 kg 62.5 kg    Exam:  GEN: NAD SKIN: Warm and dry EYES: No pallor or icterus ENT: MMM CV: RRR PULM: CTA B ABD: soft, ND, NT, +BS CNS: AAO x 3, non focal EXT: Dressing on the left hand and foot look clean, dry and intact        Data Reviewed:   I have personally reviewed following labs and imaging studies:  Labs: Labs show the following:   Basic Metabolic Panel: Recent Labs  Lab 07/07/21 0848  NA 132*  K 4.4  CL 104  CO2 20*  GLUCOSE 129*  BUN 11  CREATININE 0.65  CALCIUM 8.4*  MG 1.7  PHOS 3.8   GFR Estimated Creatinine Clearance: 87.9 mL/min (by C-G formula based on SCr of 0.65 mg/dL). Liver Function Tests: No results for input(s): AST, ALT, ALKPHOS, BILITOT, PROT, ALBUMIN in the last 168 hours. No results for input(s): LIPASE, AMYLASE in the last 168 hours. No results for input(s): AMMONIA in the last 168 hours. Coagulation profile No results for input(s): INR, PROTIME in the last 168 hours.  CBC: Recent Labs  Lab 07/07/21 1110  WBC 6.1  HGB 8.6*  HCT 28.2*  MCV 85.5  PLT 235   Cardiac Enzymes: No results for input(s): CKTOTAL, CKMB, CKMBINDEX, TROPONINI in the last 168 hours. BNP (last 3 results) No results for input(s): PROBNP in the last 8760 hours. CBG: No results for input(s): GLUCAP in the last 168 hours.  D-Dimer: No  results for input(s): DDIMER in the last 72 hours. Hgb A1c: No results for input(s): HGBA1C in the last 72 hours. Lipid Profile: No results for input(s): CHOL, HDL, LDLCALC, TRIG, CHOLHDL, LDLDIRECT in the last 72 hours. Thyroid function studies: No results for input(s): TSH, T4TOTAL, T3FREE, THYROIDAB in the last 72 hours.  Invalid input(s): FREET3 Anemia work up: No results for input(s): VITAMINB12, FOLATE, FERRITIN, TIBC, IRON, RETICCTPCT in the last 72 hours. Sepsis Labs: Recent Labs  Lab 07/07/21 1110  WBC 6.1  Microbiology No results found for this or any previous visit (from the past 240 hour(s)).  Procedures and diagnostic studies:  No results found.             LOS: 39 days   [place] Copywriter, advertising on [email or web address]. If 7PM-7AM, please contact night-coverage at [email or web address]     07/12/2021, 1:43 PM

## 2021-07-12 NOTE — TOC Progression Note (Signed)
Transition of Care [facility]) - Progression Note    Patient Details  Name: Ryan [name] MRN: [id number] Date of Birth: 1961/07/19  Transition of Care [facility]) CM/SW [place], RN Phone Number: 07/12/2021, 12:40 PM  Clinical Narrative:   Damaris Schooner with Estill Bamberg with [facility], they are going to accept the patient, They need updated FL2, TB skin test results and Covid test results faxed to [phone]         Expected Discharge Plan and Services                                                 Social Determinants of Health (SDOH) Interventions    Readmission Risk Interventions Readmission Risk Prevention Plan 04/23/2021  Transportation Screening Complete  PCP or Specialist Appt within 3-5 Days Complete  HRI or [place] Complete  Social Work Consult for [place] Planning/Counseling Not Complete  SW consult not completed comments RNCM assigned to patient  Palliative Care Screening Complete  Medication Review Press photographer) Complete  Some recent data might be hidden

## 2021-07-12 NOTE — Discharge Summary (Signed)
Physician Discharge Summary  Ryan [name] AQT:[id number] DOB: [birth year] DOA: 06/02/2021  PCP: Ryan [name], No  Admit date: 06/02/2021 Discharge date: 07/12/2021  Discharge disposition: SNF   Recommendations for Outpatient Follow-Up:   Follow-up with Dr. Grayland [name], oncologist in 2 weeks  Follow-up with orthopedic surgeon, Dr. Leim [name], in 1 to 2 weeks   Discharge Diagnosis:   Principal Problem:   Closed left ankle fracture Active Problems:   Fall   Anemia of chronic disease   Protein-calorie malnutrition, severe ([place])   Cancer cachexia ([place])   Prostate cancer metastatic to bone ([place])   Left wrist fracture, closed, initial encounter   Hypocalcemia   Hyperkalemia   Closed fracture of left distal radius    Discharge Condition: Stable.  Diet recommendation:  Diet Order             Diet regular Room service appropriate? Yes; Fluid consistency: Thin  Diet effective now                     Code Status: DNR     Hospital Course:   [name] is a 60 y.o. 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 Patient was recently hospitalized from 6/21-8/24 due to metastasized prostate cancer. Pt was seen by Dr. Grayland [name] of oncology.  He was supposed to go to a homeless shelter but reportedly, he had issues when he was transported there.  He said he felt dizzy and fell accidentally causing severe pain in the left wrist and left ankle.  Work-up revealed fracture of the left distal radius and ulna and left distal tibial and fibular fractures.  He was treated with analgesics.  Orthopedic surgery recommended conservative management.      Medical Consultants:   Orthopedic surgeon Oncologist   Discharge Exam:    Vitals:   07/11/21 1936 07/12/21 0405 07/12/21 0840 07/12/21 1154  BP: (!)  101/50 (!) 98/55 108/62 (!) 108/57  Pulse: 94 100 (!) 101 99  Resp: 18 18 16 16   Temp: 98.7 F (37.1 C) 98.7 F (37.1 C) 97.8 F (36.6 C) 97.9 F (36.6 C)  TempSrc:  Oral    SpO2: 96% 96% 92% 97%  Weight:      Height:           The results of significant diagnostics from this hospitalization (including imaging, microbiology, ancillary and laboratory) are listed below for reference.     Procedures and Diagnostic Studies:   DG Wrist Complete Left  Result Date: 06/02/2021 CLINICAL DATA:  Fall EXAM: LEFT WRIST - COMPLETE 3+ VIEW COMPARISON:  None. FINDINGS: Acute fracture distal radius and ulna. Impacted fracture on the dorsal surface of the distal radius does not appear to extend into the joint. Nondisplaced fracture distal ulna. Wrist joint appears normal. No carpal fracture. IMPRESSION: Fracture distal radius and ulna. Electronically Signed   By: Franchot Gallo M.D.   On: 06/02/2021 17:15   DG Ankle 2 Views Left  Result Date: 06/02/2021 CLINICAL DATA:  Fall EXAM: LEFT ANKLE - 2 VIEW COMPARISON:  04/23/2021 FINDINGS: Fracture noted in the distal tibial and fibular metaphyses, appears subacute, but was not present on prior study. No subluxation or dislocation. Diffuse soft tissue swelling. IMPRESSION: Distal tibial and fibular metaphyseal fractures, acute versus subacute. Electronically Signed   By: Rolm Baptise M.D.  On: 06/02/2021 20:50   CT HEAD WO CONTRAST (5MM)  Result Date: 06/03/2021 CLINICAL DATA:  Dizziness leading to a fall. Head trauma, minor, normal mental status. EXAM: CT HEAD WITHOUT CONTRAST TECHNIQUE: Contiguous axial images were obtained from the base of the skull through the vertex without intravenous contrast. COMPARISON:  MRI 04/03/2021 FINDINGS: Brain: Moderate generalized volume loss of the cerebral hemispheres. More advanced generalized cerebellar atrophy. No evidence of small-vessel disease by CT. No sign of acute infarction, mass lesion, hemorrhage,  hydrocephalus or extra-axial collection. Small abnormalities described at MRI will be followed up with subsequent MRI examinations. Vascular: There is atherosclerotic calcification of the major vessels at the base of the brain. Skull: Abnormal appearance of the bones diffusely felt previously to represent osseous metastatic disease. Widespread Paget's disease could have a similar appearance. No lytic lesion. Sinuses/Orbits: Clear/normal Other: None IMPRESSION: No acute finding by CT. Cerebellar more than cerebral atrophy. No acute or focal brain finding. Abnormal appearance of the calvarium, skull base and upper cervical spine. Previously this was felt to represent diffuse osseous metastatic disease by MRI. Electronically Signed   By: Nelson Chimes M.D.   On: 06/03/2021 17:52   CT Ankle Left Wo Contrast  Result Date: 06/03/2021 CLINICAL DATA:  Ankle pain, no prior imaging EXAM: CT OF THE LEFT ANKLE WITHOUT CONTRAST TECHNIQUE: Multidetector CT imaging of the left ankle was performed according to the standard protocol. Multiplanar CT image reconstructions were also generated. COMPARISON:  Foot radiograph 04/23/2021 FINDINGS: Bones/Joint/Cartilage Diffuse osteopenia. There is an impacted distal tibial metaphyseal fracture with mild posterior displacement and angulation. This does not extend to the tibiotalar joint. There is also an impacted distal fibular fracture with minimal posterior displacement and angulation. These fractures are transversely oriented. Ligaments Suboptimally assessed by CT. Muscles and Tendons The significant muscle atrophy. There is no evidence of tendon entrapment. Soft tissues Diffuse soft tissue swelling of the ankle. IMPRESSION: Impacted distal tibial and fibular fractures, with mild posterior displacement and angulation. No evidence of extension to the tibiotalar joint. Diffuse osteopenia.  Diffuse soft tissue swelling of the ankle. Electronically Signed   By: Maurine Simmering M.D.   On:  06/03/2021 13:09     Labs:   Basic Metabolic Panel: Recent Labs  Lab 07/07/21 0848  NA 132*  K 4.4  CL 104  CO2 20*  GLUCOSE 129*  BUN 11  CREATININE 0.65  CALCIUM 8.4*  MG 1.7  PHOS 3.8   GFR Estimated Creatinine Clearance: 87.9 mL/min (by C-G formula based on SCr of 0.65 mg/dL). Liver Function Tests: No results for input(s): AST, ALT, ALKPHOS, BILITOT, PROT, ALBUMIN in the last 168 hours. No results for input(s): LIPASE, AMYLASE in the last 168 hours. No results for input(s): AMMONIA in the last 168 hours. Coagulation profile No results for input(s): INR, PROTIME in the last 168 hours.  CBC: Recent Labs  Lab 07/07/21 1110  WBC 6.1  HGB 8.6*  HCT 28.2*  MCV 85.5  PLT 235   Cardiac Enzymes: No results for input(s): CKTOTAL, CKMB, CKMBINDEX, TROPONINI in the last 168 hours. BNP: Invalid input(s): POCBNP CBG: No results for input(s): GLUCAP in the last 168 hours. D-Dimer No results for input(s): DDIMER in the last 72 hours. Hgb A1c No results for input(s): HGBA1C in the last 72 hours. Lipid Profile No results for input(s): CHOL, HDL, LDLCALC, TRIG, CHOLHDL, LDLDIRECT in the last 72 hours. Thyroid function studies No results for input(s): TSH, T4TOTAL, T3FREE, THYROIDAB in the last 72 hours.  Invalid input(s): FREET3 Anemia work up No results for input(s): VITAMINB12, FOLATE, FERRITIN, TIBC, IRON, RETICCTPCT in the last 72 hours. Microbiology No results found for this or any previous visit (from the past 240 hour(s)).   Discharge Instructions:    Allergies as of 07/12/2021   No Known Allergies      Medication List     STOP taking these medications    dexamethasone 4 MG tablet Commonly known as: DECADRON   feeding supplement Liqd   methocarbamol 500 MG tablet Commonly known as: ROBAXIN       TAKE these medications    hydrOXYzine 25 MG tablet Commonly known as: ATARAX/VISTARIL Take 1 tablet (25 mg total) by mouth 3 (three) times  daily as needed for anxiety.   magnesium oxide 400 (240 Mg) MG tablet Commonly known as: MAG-OX Take 1 tablet (400 mg total) by mouth daily. Start taking on: July 13, 2021   midodrine 5 MG tablet Commonly known as: PROAMATINE Take 1 tablet (5 mg total) by mouth 3 (three) times daily with meals. What changed: how much to take   mirtazapine 15 MG tablet Commonly known as: REMERON Take 1 tablet (15 mg total) by mouth once daily at bedtime.           If you experience worsening of your admission symptoms, develop shortness of breath, life threatening emergency, suicidal or homicidal thoughts you must seek medical attention immediately by calling 911 or calling your MD immediately  if symptoms less severe.   You must read complete instructions/literature along with all the possible adverse reactions/side effects for all the medicines you take and that have been prescribed to you. Take any new medicines after you have completely understood and accept all the possible adverse reactions/side effects.    Please note   You were cared for by a hospitalist during your hospital stay. If you have any questions about your discharge medications or the care you received while you were in the hospital after you are discharged, you can call the unit and asked to speak with the hospitalist on call if the hospitalist that took care of you is not available. Once you are discharged, your primary care physician will handle any further medical issues. Please note that NO REFILLS for any discharge medications will be authorized once you are discharged, as it is imperative that you return to your primary care physician (or establish a relationship with a primary care physician if you do not have one) for your aftercare needs so that they can reassess your need for medications and monitor your lab values.       Signed:  Jennye Boroughs  [facility] 07/12/2021, 1:41 PM   Pager on [email or web address]. If  7PM-7AM, please contact night-coverage at [email or web address]

## 2021-07-12 NOTE — TOC Progression Note (Signed)
Transition of Care [facility]) - Progression Note    Patient Details  Name: Ryan [name] MRN: [id number] Date of Birth: Jun 12, 1961  Transition [facility] Care [facility]) CM/SW Contact  Su Hilt, RN Phone Number: 07/12/2021, 11:29 AM  Clinical Narrative:   Reached out both text and secure Voice Mail asking if [facility] was going to accept the patient, Awaiting a call back         Expected Discharge Plan and Services                                                 Social Determinants of Health (SDOH) Interventions    Readmission Risk Interventions Readmission Risk Prevention Plan 04/23/2021  Transportation Screening Complete  PCP or Specialist Appt within 3-5 Days Complete  HRI or [place] Complete  Social Work Consult for [place] Planning/Counseling Not Complete  SW consult not completed comments RNCM assigned to patient  Palliative Care Screening Complete  Medication Review Press photographer) Complete  Some recent data might be hidden

## 2021-07-12 NOTE — Plan of Care (Signed)
No acute events overnight. Problem: Education: Goal: Knowledge of General Education information will improve Description: Including pain rating scale, medication(s)/side effects and non-pharmacologic comfort measures Outcome: Progressing   Problem: Health Behavior/Discharge Planning: Goal: Ability to manage health-related needs will improve Outcome: Progressing   Problem: Clinical Measurements: Goal: Ability to maintain clinical measurements within normal limits will improve Outcome: Progressing Goal: Will remain free from infection Outcome: Progressing Goal: Diagnostic test results will improve Outcome: Progressing Goal: Respiratory complications will improve Outcome: Progressing Goal: Cardiovascular complication will be avoided Outcome: Progressing   Problem: Activity: Goal: Risk for activity intolerance will decrease Outcome: Progressing   Problem: Nutrition: Goal: Adequate nutrition will be maintained Outcome: Progressing   Problem: Coping: Goal: Level of anxiety will decrease Outcome: Progressing   Problem: Elimination: Goal: Will not experience complications related to bowel motility Outcome: Progressing Goal: Will not experience complications related to urinary retention Outcome: Progressing   Problem: Pain Managment: Goal: General experience of comfort will improve Outcome: Progressing   Problem: Safety: Goal: Ability to remain free from injury will improve Outcome: Progressing   Problem: Skin Integrity: Goal: Risk for impaired skin integrity will decrease Outcome: Progressing   Problem: Education: Goal: Knowledge of the prescribed therapeutic regimen will improve Outcome: Progressing   Problem: Activity: Goal: Ability to increase mobility will improve Outcome: Progressing   Problem: Pain Management: Goal: Pain level will decrease with appropriate interventions Outcome: Progressing

## 2021-07-12 NOTE — TOC Progression Note (Addendum)
Transition of Care [facility]) - Progression Note    Patient Details  Name: Ryan [name] MRN: [id number] Date of Birth: [birth year]  Transition of Care [facility]) CM/SW Contact  Su Hilt, RN Phone Number: 07/12/2021, 2:19 PM  Clinical Narrative:   Tawni Carnes, DC summary and Neg TB skin test to [place] [phone]         Expected Discharge Plan and Services                                                 Social Determinants of Health (SDOH) Interventions    Readmission Risk Interventions Readmission Risk Prevention Plan 04/23/2021  Transportation Screening Complete  PCP or Specialist Appt within 3-5 Days Complete  HRI or [place] Complete  Social Work Consult for [place] Planning/Counseling Not Complete  SW consult not completed comments RNCM assigned to patient  Palliative Care Screening Complete  Medication Review Press photographer) Complete  Some recent data might be hidden

## 2021-07-13 NOTE — Progress Notes (Signed)
Nutrition Follow-up  DOCUMENTATION CODES:  Severe malnutrition in context of chronic illness, Underweight  INTERVENTION:  Continue current diet as ordered Continue Ensure Enlive po QID, each supplement provides 350 kcal and 20 grams of protein Continue MVI with minerals daily Continue snacks TID Weekly weights  NUTRITION DIAGNOSIS:  Severe Malnutrition related to chronic illness, cancer and cancer related treatments as evidenced by severe fat depletion, severe muscle depletion.  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 Recently admitted 6/21-8/24. Discharged to homeless shelter then returned to ED a few hours later after a fall and pain to his wrist.  Stable for dc, case management team working on placement. Will likely dc this week per case management, bed offer and insurance authorization obtained.  Average Meal Intake: 8/27-8/29: 72% intake x 2 recorded meals (50-94%) 8/30-9/7: 63% intake x 2 recorded meals (25-100%) 9/8-9/14: 50% intake x 3 recorded meals (50%) 9/15-9/20: 35% intake x 2 recorded meals (20-50%) 9/21-9/27: 50% intake x 3 recorded meals (0-100%) 9/28-10/4: limited meals recorded  Nutritionally Relevant Medications: Scheduled Meds:  calcium carbonate  1,250 mg Oral TID WC   feeding supplement  237 mL Oral QID   magnesium oxide  400 mg Oral Daily   mirtazapine  15 mg Oral QHS   multivitamin with minerals  1 tablet Oral Daily   senna-docusate  1 tablet Oral BID   PRN Meds: ondansetron  Labs Reviewed  NUTRITION - FOCUSED PHYSICAL EXAM: Flowsheet Row Most Recent Value  Orbital Region Mild depletion  Upper Arm Region Severe depletion  Thoracic and Lumbar Region Severe  depletion  Buccal Region Mild depletion  Temple Region Moderate depletion  Clavicle Bone Region Severe depletion  Clavicle and Acromion Bone Region Severe depletion  Scapular Bone Region Severe depletion  Dorsal Hand Severe depletion  Patellar Region Severe depletion  Anterior Thigh Region Severe depletion  Posterior Calf Region Severe depletion  Edema (RD Assessment) None  Hair Reviewed  Eyes Reviewed  Mouth Reviewed  Skin Reviewed  Nails Reviewed    Diet Order:   Diet Order             Diet regular Room service appropriate? Yes; Fluid consistency: Thin  Diet effective now                  EDUCATION NEEDS:  Education needs have been addressed  Skin:  Skin Assessment: Reviewed RN Assessment  Last BM:  10/2  Height:  Ht Readings from Last 1 Encounters:  07/06/21 6\' 5"  (1.956 m)   Weight:  Wt Readings from Last 1 Encounters:  07/07/21 62.5 kg   Ideal Body Weight:  94.5 kg  BMI:  Body mass index is 16.34 kg/m.  Estimated Nutritional Needs:  Kcal:  2400-2600 Protein:  130-150 grams Fluid:  >2.4 L  Ranell Patrick, RD, LDN Clinical Dietitian Pager on [place]

## 2021-07-13 NOTE — Progress Notes (Signed)
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  Pt recently here for multiple months with difficult d/c planning, did ultimatley d/c to homeless shelter and was gone only a few hours before fall with L ankle and wrist fractures with return to the ED.    PT Comments    Pt was willing to participate with PT with no hesitation.  He does have continued hesitation to do a lot, but was able to do more today with standing/mobility/weight shifting that he has since L ankle and wrist fractures.  He was good about maintaining NWBing on L LE, or at least keeping vast majority of the weight off it.  Pt making slow but steady improvement, reports being happy that he may be discharging this week.   Recommendations for follow up therapy are one component of a multi-disciplinary discharge planning process, led by the attending physician.  Recommendations may be updated based on patient status, additional functional criteria and insurance authorization.  Follow Up Recommendations  SNF;Supervision/Assistance - 24 hour     Equipment Recommendations  Rolling walker with 5" wheels;Wheelchair (measurements PT);Wheelchair cushion (measurements PT)    Recommendations for Other Services       Precautions / Restrictions Precautions Precautions: Fall Precaution Comments: NWB LUE/LLE Required Braces or Orthoses: Splint/Cast Spinal Brace Comments: pt no longer using TLSO brace Splint/Cast: L ankle/LUE Restrictions LUE Weight Bearing: Non weight bearing LLE Weight Bearing: Non weight bearing     Mobility  Bed Mobility Overal bed mobility: Needs Assistance Bed Mobility: Supine to Sit;Sit  to Supine     Supine to sit: Supervision Sit to supine: Supervision   General bed mobility comments: Pt slow with transition, but able to do so w/o assist    Transfers Overall transfer level: Needs assistance Equipment used: Left platform walker Transfers: Sit to/from Stand Sit to Stand: From elevated surface;Min assist         General transfer comment: Pt very willing to try standing this date.  He does need bed raised a few inches (he is 5'6") and plenty of cuing for set up, UE/AD use and sequencing but did manage to attain standing with only light assist  Ambulation/Gait             General Gait Details: unable to trial ambulation away from bed, but did manage to sustain standing and do multiple heel-toe shuffles to the side with heavy cuing, UE use of platform walker.  He did appear to maintain NWBing (or very close to it) on the L LE t/o the effort   Stairs             Wheelchair Mobility    Modified Rankin (Stroke Patients Only)       Balance Overall balance assessment: Needs assistance Sitting-balance support: No upper extremity supported Sitting balance-Leahy Scale: Good     Standing balance support: Bilateral upper extremity supported Standing balance-Leahy Scale: Fair Standing balance comment: able to maintain standing and L NWBing statically, direct assist during weight shift/heel-toe effort                            Cognition Arousal/Alertness: Awake/alert Behavior During Therapy:  WFL for tasks assessed/performed Overall Cognitive Status: Within Functional Limits for tasks assessed                                        Exercises General Exercises - Lower Extremity Ankle Circles/Pumps: AROM;10 reps Quad Sets: Strengthening;10 reps Heel Slides: Strengthening;10 reps (resisted leg ext on R) Hip ABduction/ADduction: Strengthening;10 reps Straight Leg Raises: AROM;10 reps    General Comments         Pertinent Vitals/Pain Pain Assessment: 0-10 Pain Score: 4  Pain Location: L shoulder    Home Living                      Prior Function            PT Goals (current goals can now be found in the care plan section) Acute Rehab PT Goals Patient Stated Goal: to get stronger Progress towards PT goals: Progressing toward goals    Frequency    Min 2X/week      PT Plan Current plan remains appropriate    Co-evaluation              AM-PAC PT "6 Clicks" Mobility   Outcome Measure  Help needed turning from your back to your side while in a flat bed without using bedrails?: None Help needed moving from lying on your back to sitting on the side of a flat bed without using bedrails?: A Little Help needed moving to and from a bed to a chair (including a wheelchair)?: A Lot Help needed standing up from a chair using your arms (e.g., wheelchair or bedside chair)?: A Lot Help needed to walk in hospital room?: Total Help needed climbing 3-5 steps with a railing? : Total 6 Click Score: 13    End of Session Equipment Utilized During Treatment: Gait belt Activity Tolerance: Patient tolerated treatment well Patient left: with call bell/phone within reach;in bed;with bed alarm set   PT Visit Diagnosis: Unsteadiness on feet (R26.81);Muscle weakness (generalized) (M62.81);Difficulty in walking, not elsewhere classified (R26.2);History of falling (Z91.81) Pain - Right/Left: Left Pain - part of body: Ankle and joints of foot     Time: 3614-4315 PT Time Calculation (min) (ACUTE ONLY): 26 min  Charges:  $Therapeutic Exercise: 8-22 mins $Therapeutic Activity: 8-22 mins                     Kreg Shropshire, DPT 07/13/2021, 5:48 PM

## 2021-07-13 NOTE — TOC Progression Note (Addendum)
Transition of Care [facility]) - Progression Note    Patient Details  Name: Ryan [name] MRN: [id number] Date of Birth: 03/16/1961  Transition of Care [facility]) CM/SW [place], RN Phone Number: 07/13/2021, 9:27 AM  Clinical Narrative:    I reached out to [facility] at [facility] requesting them to accept there patient today, awaiting a response, Was told that Delana Meyer is not there today, I requested to speak to Estill Bamberg, [employer] took a message for Estill Bamberg to call me back        Expected Discharge Plan and Services                                                 Social Determinants of Health (SDOH) Interventions    Readmission Risk Interventions Readmission Risk Prevention Plan 04/23/2021  Transportation Screening Complete  PCP or Specialist Appt within 3-5 Days Complete  HRI or Home Care Consult Complete  Social Work Consult for [place] Planning/Counseling Not Complete  SW consult not completed comments RNCM assigned to patient  Palliative Care Screening Complete  Medication Review Press photographer) Complete  Some recent data might be hidden

## 2021-07-13 NOTE — Plan of Care (Signed)
  Problem: Education: Goal: Knowledge of General Education information will improve Description: Including pain rating scale, medication(s)/side effects and non-pharmacologic comfort measures Outcome: Progressing   Problem: Health Behavior/Discharge Planning: Goal: Ability to manage health-related needs will improve Outcome: Progressing   Problem: Clinical Measurements: Goal: Ability to maintain clinical measurements within normal limits will improve Outcome: Progressing Goal: Will remain free from infection Outcome: Progressing Goal: Diagnostic test results will improve Outcome: Progressing Goal: Respiratory complications will improve Outcome: Progressing Goal: Cardiovascular complication will be avoided Outcome: Progressing   Problem: Activity: Goal: Risk for activity intolerance will decrease Outcome: Progressing   Problem: Nutrition: Goal: Adequate nutrition will be maintained Outcome: Progressing   Problem: Coping: Goal: Level of anxiety will decrease Outcome: Progressing   Problem: Elimination: Goal: Will not experience complications related to bowel motility Outcome: Progressing Goal: Will not experience complications related to urinary retention Outcome: Progressing   Problem: Pain Managment: Goal: General experience of comfort will improve Outcome: Progressing   Problem: Safety: Goal: Ability to remain free from injury will improve Outcome: Progressing   Problem: Skin Integrity: Goal: Risk for impaired skin integrity will decrease Outcome: Progressing   Problem: Education: Goal: Knowledge of the prescribed therapeutic regimen will improve Outcome: Progressing   Problem: Activity: Goal: Ability to increase mobility will improve Outcome: Progressing   Problem: Pain Management: Goal: Pain level will decrease with appropriate interventions Outcome: Progressing

## 2021-07-13 NOTE — Progress Notes (Signed)
Progress Note    Ryan [name]  YTK:[id number] DOB: 01/25/1961  DOA: 06/02/2021 PCP: Pcp, No      Brief Narrative:    Medical records reviewed and are as summarized below:  Ryan [name] is a 60 y.o. 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 Patient was recently hospitalized from 6/21-8/24 due to metastasized prostate cancer. Pt was seen by Dr. Grayland [name] of oncology.  He was supposed to go to a homeless shelter but reportedly, he had issues when he was transported there.  He said he felt dizzy and fell accidentally causing severe pain in the left wrist and left ankle.  Work-up revealed fracture of the left distal radius and ulna and left distal tibial and fibular fractures.  He was treated with analgesics.  Orthopedic surgery recommended conservative management.      Assessment/Plan:   Principal Problem:   Closed left ankle fracture Active Problems:   Fall   Anemia of chronic disease   Protein-calorie malnutrition, severe (HCC)   Cancer cachexia ([place])   Prostate cancer metastatic to bone (HCC)   Left wrist fracture, closed, initial encounter   Hypocalcemia   Hyperkalemia   Closed fracture of left distal radius   Nutrition Problem: Severe Malnutrition Etiology: chronic illness, cancer and cancer related treatments  Signs/Symptoms: severe fat depletion, severe muscle depletion   Body mass index is 16.34 kg/m.   Closed left ankle fracture and left wrist fracture: Orthopedic surgery recommended conservative management.  Patient will be nonweightbearing left lower extremity.  Outpatient follow-up with orthopedic surgeon strongly recommended.  Stage IV prostate cancer with metastasis to the bone: PSA level has dropped from > 3000 (2 months ago) to 433 (1 month ago).  Outpatient follow-up  with oncologist.  Hypocalcemia: Continue calcium carbonate  Hyponatremia: Asymptomatic  Chronic intermittent hypotension: Continue midodrine  Other comorbidities include anemia of chronic disease, anxiety, depression, cachexia, insomnia  Tuberculin skin test was done on 07/09/2021 and this was negative.  Chest x-ray was requested as well but patient had a chest x-ray in July 2022 which did not show any changes concerning for tuberculosis.  Additionally, he does not have any symptoms to suggest tuberculosis.  There is no need to repeat chest x-ray at this time. Screening COVID test was negative. Awaiting placement to SNF  Diet Order             Diet regular Room service appropriate? Yes; Fluid consistency: Thin  Diet effective now                      Consultants: Oncologist Orthopedic surgeon  Procedures: None    Medications:    calcium carbonate  1,250 mg Oral TID WC   feeding supplement  237 mL Oral QID   magnesium oxide  400 mg Oral Daily   midodrine  5 mg Oral TID WC   mirtazapine  15 mg Oral QHS   multivitamin with minerals  1 tablet Oral Daily   nicotine  21 mg Transdermal Daily   rivaroxaban  10 mg Oral Q supper   senna-docusate  1 tablet Oral BID   Continuous Infusions:  sodium chloride Stopped (06/04/21 1216)     Anti-infectives (From admission, onward)    None              Family  Communication/Anticipated D/C date and plan/Code Status   DVT prophylaxis: rivaroxaban (XARELTO) tablet 10 mg Start: 06/29/21 2215 rivaroxaban (XARELTO) tablet 10 mg     Code Status: DNR  Family Communication: None Disposition Plan:    Status is: Inpatient  Remains inpatient appropriate because:Unsafe d/c plan  Dispo: The patient is from: Home              Anticipated d/c is to: SNF              Patient currently is medically stable to d/c.   Difficult to place patient Yes           Subjective:   No complaints.  No pain in the  extremities.  Objective:    Vitals:   07/12/21 1930 07/13/21 0522 07/13/21 0526 07/13/21 0825  BP: (!) 104/52 (!) 97/48 107/66 (!) 106/55  Pulse: 98 100 62 (!) 104  Resp: 16 16 17 14   Temp: 97.8 F (36.6 C) 97.7 F (36.5 C) 97.9 F (36.6 C) 98 F (36.7 C)  TempSrc:      SpO2: 95% 97% 96% 98%  Weight:      Height:       No data found.   Intake/Output Summary (Last 24 hours) at 07/13/2021 1504 Last data filed at 07/12/2021 1932 Gross per 24 hour  Intake --  Output 675 ml  Net -675 ml   Filed Weights   06/23/21 1545 07/06/21 1406 07/07/21 1443  Weight: 62.6 kg 65.6 kg 62.5 kg    Exam:  GEN: NAD SKIN: No rash EYES: EOMI ENT: MMM CV: RRR PULM: CTA B ABD: soft, ND, NT, +BS CNS: AAO x 3, non focal EXT: Dressing on left forearm and left leg look clean, dry and intact.  No edema or tenderness        Data Reviewed:   I have personally reviewed following labs and imaging studies:  Labs: Labs show the following:   Basic Metabolic Panel: Recent Labs  Lab 07/07/21 0848  NA 132*  K 4.4  CL 104  CO2 20*  GLUCOSE 129*  BUN 11  CREATININE 0.65  CALCIUM 8.4*  MG 1.7  PHOS 3.8   GFR Estimated Creatinine Clearance: 87.9 mL/min (by C-G formula based on SCr of 0.65 mg/dL). Liver Function Tests: No results for input(s): AST, ALT, ALKPHOS, BILITOT, PROT, ALBUMIN in the last 168 hours. No results for input(s): LIPASE, AMYLASE in the last 168 hours. No results for input(s): AMMONIA in the last 168 hours. Coagulation profile No results for input(s): INR, PROTIME in the last 168 hours.  CBC: Recent Labs  Lab 07/07/21 1110  WBC 6.1  HGB 8.6*  HCT 28.2*  MCV 85.5  PLT 235   Cardiac Enzymes: No results for input(s): CKTOTAL, CKMB, CKMBINDEX, TROPONINI in the last 168 hours. BNP (last 3 results) No results for input(s): PROBNP in the last 8760 hours. CBG: No results for input(s): GLUCAP in the last 168 hours.  D-Dimer: No results for input(s): DDIMER  in the last 72 hours. Hgb A1c: No results for input(s): HGBA1C in the last 72 hours. Lipid Profile: No results for input(s): CHOL, HDL, LDLCALC, TRIG, CHOLHDL, LDLDIRECT in the last 72 hours. Thyroid function studies: No results for input(s): TSH, T4TOTAL, T3FREE, THYROIDAB in the last 72 hours.  Invalid input(s): FREET3 Anemia work up: No results for input(s): VITAMINB12, FOLATE, FERRITIN, TIBC, IRON, RETICCTPCT in the last 72 hours. Sepsis Labs: Recent Labs  Lab 07/07/21 1110  WBC 6.1  Microbiology Recent Results (from the past 240 hour(s))  Resp Panel by RT-PCR (Flu A&B, Covid) Nasopharyngeal Swab     Status: None   Collection Time: 07/12/21  2:07 PM   Specimen: Nasopharyngeal Swab; Nasopharyngeal(NP) swabs in vial transport medium  Result Value Ref Range Status   SARS Coronavirus 2 by RT PCR NEGATIVE NEGATIVE Final    Comment: (NOTE) SARS-CoV-2 target nucleic acids are NOT DETECTED.  The SARS-CoV-2 RNA is generally detectable in upper respiratory specimens during the acute phase of infection. The lowest concentration of SARS-CoV-2 viral copies this assay can detect is 138 copies/mL. A negative result does not preclude SARS-Cov-2 infection and should not be used as the sole basis for treatment or other patient management decisions. A negative result may occur with  improper specimen collection/handling, submission of specimen other than nasopharyngeal swab, presence of viral mutation(s) within the areas targeted by this assay, and inadequate number of viral copies(<138 copies/mL). A negative result must be combined with clinical observations, patient history, and epidemiological information. The expected result is Negative.  Fact Sheet for Patients:  EntrepreneurPulse.com.au  Fact Sheet for Healthcare Providers:  IncredibleEmployment.be  This test is no t yet approved or cleared by the [place] FDA and  has been authorized  for detection and/or diagnosis of SARS-CoV-2 by FDA under an Emergency Use Authorization (EUA). This EUA will remain  in effect (meaning this test can be used) for the duration of the COVID-19 declaration under Section 564(b)(1) of the Act, 21 U.S.C.section 360bbb-3(b)(1), unless the authorization is terminated  or revoked sooner.       Influenza A by PCR NEGATIVE NEGATIVE Final   Influenza B by PCR NEGATIVE NEGATIVE Final    Comment: (NOTE) The Xpert Xpress SARS-CoV-2/FLU/RSV plus assay is intended as an aid in the diagnosis of influenza from Nasopharyngeal swab specimens and should not be used as a sole basis for treatment. Nasal washings and aspirates are unacceptable for Xpert Xpress SARS-CoV-2/FLU/RSV testing.  Fact Sheet for Patients: EntrepreneurPulse.com.au  Fact Sheet for Healthcare Providers: IncredibleEmployment.be  This test is not yet approved or cleared by the [place] FDA and has been authorized for detection and/or diagnosis of SARS-CoV-2 by FDA under an Emergency Use Authorization (EUA). This EUA will remain in effect (meaning this test can be used) for the duration of the COVID-19 declaration under Section 564(b)(1) of the Act, 21 U.S.C. section 360bbb-3(b)(1), unless the authorization is terminated or revoked.  Performed at [facility], [address] [place], [place] [address]     Procedures and diagnostic studies:  No results found.             LOS: 40 days   [place] Copywriter, advertising on [email or web address]. If 7PM-7AM, please contact night-coverage at [email or web address]     07/13/2021, 3:04 PM

## 2021-07-13 NOTE — TOC Progression Note (Signed)
Transition of Care [facility]) - Progression Note    Patient Details  Name: Ryan [name] MRN: [id number] Date of Birth: May 06, 1961  Transition of Care [facility]) CM/SW Contact  Su Hilt, RN Phone Number: 07/13/2021, 10:25 AM  Clinical Narrative:   Resent the FL2, DC Summary and TB Skin test via secure email to [facility] [email or web address]          Expected Discharge Plan and Services                                                 Social Determinants of Health (SDOH) Interventions    Readmission Risk Interventions Readmission Risk Prevention Plan 04/23/2021  Transportation Screening Complete  PCP or Specialist Appt within 3-5 Days Complete  HRI or [place] Complete  Social Work Consult for [place] Planning/Counseling Not Complete  SW consult not completed comments RNCM assigned to patient  Palliative Care Screening Complete  Medication Review Press photographer) Complete  Some recent data might be hidden

## 2021-07-14 LAB — CBC
HCT: 29.5 % — ABNORMAL LOW (ref 39.0–52.0)
Hemoglobin: 9.1 g/dL — ABNORMAL LOW (ref 13.0–17.0)
MCH: 26.1 pg (ref 26.0–34.0)
MCHC: 30.8 g/dL (ref 30.0–36.0)
MCV: 84.8 fL (ref 80.0–100.0)
Platelets: 218 10*3/uL (ref 150–400)
RBC: 3.48 MIL/uL — ABNORMAL LOW (ref 4.22–5.81)
RDW: 14.6 % (ref 11.5–15.5)
WBC: 5.8 10*3/uL (ref 4.0–10.5)
nRBC: 0 % (ref 0.0–0.2)

## 2021-07-14 LAB — BASIC METABOLIC PANEL
Anion gap: 6 (ref 5–15)
BUN: 11 mg/dL (ref 6–20)
CO2: 22 mmol/L (ref 22–32)
Calcium: 8.4 mg/dL — ABNORMAL LOW (ref 8.9–10.3)
Chloride: 106 mmol/L (ref 98–111)
Creatinine, Ser: 0.67 mg/dL (ref 0.61–1.24)
GFR, Estimated: >60 mL/min (ref >60)
Glucose, Bld: 104 mg/dL — ABNORMAL HIGH (ref 70–99)
Potassium: 4.5 mmol/L (ref 3.5–5.1)
Sodium: 134 mmol/L — ABNORMAL LOW (ref 135–145)

## 2021-07-14 NOTE — TOC Progression Note (Signed)
Transition of Care [facility]) - Progression Note    Patient Details  Name: Unique Searfoss MRN: [id number] Date of Birth: 07/08/1961  Transition of Care [facility]) CM/SW [place], RN Phone Number: 07/14/2021, 9:59 AM  Clinical Narrative:   Spoke with Estill Bamberg and Arion at [facility], They did receive the LOG, They requested the FL2 to be refaxed, I refaxed the [place] to [phone], the patient will need a Covid test, I provided them with the Patient's Aunt's contact information I provided them with the [facility] contact information, I provided them with [facility] contact information and explained that the medications other than narcotics are gotten at [facility], I gave the date that the disability was applied for as 8/25, I explained that Medicaid is pending disability approval, T explained that a [employer] form was submitted to expedite the disability. Jasmine stated that they are working on this and they will give me a call back today with when they would be able to accept the patient         Expected Discharge Plan and Services                                                 Social Determinants of Health (SDOH) Interventions    Readmission Risk Interventions Readmission Risk Prevention Plan 04/23/2021  Transportation Screening Complete  PCP or Specialist Appt within 3-5 Days Complete  HRI or Home Care Consult Complete  Social Work Consult for [place] Planning/Counseling Not Complete  SW consult not completed comments RNCM assigned to patient  Palliative Care Screening Complete  Medication Review Press photographer) Complete  Some recent data might be hidden

## 2021-07-14 NOTE — Progress Notes (Signed)
Progress Note    Ryan [name]  AVW:[id number] DOB: December 18, 1960  DOA: 06/02/2021 PCP: Pcp, No      Brief Narrative:    Medical records reviewed and are as summarized below:  Ryan [name] is a 60 y.o. 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 Patient was recently hospitalized from 6/21-8/24 due to metastasized prostate cancer. Pt was seen by [name] of oncology.  He was supposed to go to a homeless shelter but reportedly, he had issues when he was transported there.  He said he felt dizzy and fell accidentally causing severe pain in the left wrist and left ankle.  Work-up revealed fracture of the left distal radius and ulna and left distal tibial and fibular fractures.  He was treated with analgesics.  Orthopedic surgery recommended conservative management.      Assessment/Plan:   Principal Problem:   Closed left ankle fracture Active Problems:   Fall   Anemia of chronic disease   Protein-calorie malnutrition, severe (HCC)   Cancer cachexia ([place])   Prostate cancer metastatic to bone (HCC)   Left wrist fracture, closed, initial encounter   Hypocalcemia   Hyperkalemia   Closed fracture of left distal radius   Nutrition Problem: Severe Malnutrition Etiology: chronic illness, cancer and cancer related treatments  Signs/Symptoms: severe fat depletion, severe muscle depletion   Body mass index is 16.34 kg/m.   Closed left ankle fracture and left wrist fracture: Orthopedic surgery recommended conservative management.  Patient will be nonweightbearing left lower extremity.  Patient evaluated by orthopedic surgeon today.  Outpatient follow-up recommended in 4 to 6 weeks postdischarge.   Stage IV prostate cancer with metastasis to the bone: PSA level has dropped from > 3000 (2 months ago)  to 433 (1 month ago).  Outpatient follow-up with oncologist.  Hypocalcemia: Continue calcium,  Hyponatremia: Asymptomatic  Chronic intermittent hypotension: Continue midodrine  Other comorbidities include anemia of chronic disease, anxiety, depression, cachexia, insomnia  Tuberculin skin test was done on 07/09/2021 and this was negative.  Chest x-ray was requested as well but patient had a chest x-ray in July 2022 which did not show any changes concerning for tuberculosis.  Additionally, he does not have any symptoms to suggest tuberculosis.  There is no need to repeat chest x-ray at this time. Screening COVID test was negative. Awaiting placement to SNF  Diet Order             Diet regular Room service appropriate? Yes; Fluid consistency: Thin  Diet effective now                      Consultants: Oncologist Orthopedic surgeon  Procedures: None    Medications:    calcium carbonate  1,250 mg Oral TID WC   feeding supplement  237 mL Oral QID   magnesium oxide  400 mg Oral Daily   midodrine  5 mg Oral TID WC   mirtazapine  15 mg Oral QHS   multivitamin with minerals  1 tablet Oral Daily   nicotine  21 mg Transdermal Daily   rivaroxaban  10 mg Oral Q supper   senna-docusate  1 tablet Oral BID   Continuous Infusions:  sodium chloride Stopped (06/04/21 1216)     Anti-infectives (From admission, onward)    None  Family Communication/Anticipated D/C date and plan/Code Status   DVT prophylaxis: rivaroxaban (XARELTO) tablet 10 mg Start: 06/29/21 2215 rivaroxaban (XARELTO) tablet 10 mg     Code Status: DNR  Family Communication: None Disposition Plan:    Status is: Inpatient  Remains inpatient appropriate because:Unsafe d/c plan  Dispo: The patient is from: Home              Anticipated d/c is to: SNF              Patient currently is medically stable to d/c.   Difficult to place patient Yes           Subjective:    Interval events noted.  He has no complaints.  He feels better.  Objective:    Vitals:   07/13/21 2040 07/14/21 0541 07/14/21 0700 07/14/21 1200  BP: (!) 95/58 92/61 (!) 101/56 (!) 110/58  Pulse: 95 98 (!) 101 94  Resp: 20 20 18 20   Temp: 97.9 F (36.6 C) 98 F (36.7 C) 98 F (36.7 C) 98 F (36.7 C)  TempSrc: Oral Oral Oral Oral  SpO2: 96% 97%  99%  Weight:      Height:       No data found.   Intake/Output Summary (Last 24 hours) at 07/14/2021 1541 Last data filed at 07/14/2021 1027 Gross per 24 hour  Intake 240 ml  Output 1125 ml  Net -885 ml   Filed Weights   06/23/21 1545 07/06/21 1406 07/07/21 1443  Weight: 62.6 kg 65.6 kg 62.5 kg    Exam:  GEN: NAD SKIN: Warm and dry EYES: No pallor or icterus ENT: MMM CV: RRR PULM: CTA B ABD: soft, ND, NT, +BS CNS: AAO x 3, non focal EXT: Dressing on left forearm and left leg clean, dry and intact.  No tenderness         Data Reviewed:   I have personally reviewed following labs and imaging studies:  Labs: Labs show the following:   Basic Metabolic Panel: Recent Labs  Lab 07/14/21 0327  NA 134*  K 4.5  CL 106  CO2 22  GLUCOSE 104*  BUN 11  CREATININE 0.67  CALCIUM 8.4*   GFR Estimated Creatinine Clearance: 87.9 mL/min (by C-G formula based on SCr of 0.67 mg/dL). Liver Function Tests: No results for input(s): AST, ALT, ALKPHOS, BILITOT, PROT, ALBUMIN in the last 168 hours. No results for input(s): LIPASE, AMYLASE in the last 168 hours. No results for input(s): AMMONIA in the last 168 hours. Coagulation profile No results for input(s): INR, PROTIME in the last 168 hours.  CBC: Recent Labs  Lab 07/14/21 0327  WBC 5.8  HGB 9.1*  HCT 29.5*  MCV 84.8  PLT 218   Cardiac Enzymes: No results for input(s): CKTOTAL, CKMB, CKMBINDEX, TROPONINI in the last 168 hours. BNP (last 3 results) No results for input(s): PROBNP in the last 8760 hours. CBG: No results for input(s): GLUCAP in the last  168 hours.  D-Dimer: No results for input(s): DDIMER in the last 72 hours. Hgb A1c: No results for input(s): HGBA1C in the last 72 hours. Lipid Profile: No results for input(s): CHOL, HDL, LDLCALC, TRIG, CHOLHDL, LDLDIRECT in the last 72 hours. Thyroid function studies: No results for input(s): TSH, T4TOTAL, T3FREE, THYROIDAB in the last 72 hours.  Invalid input(s): FREET3 Anemia work up: No results for input(s): VITAMINB12, FOLATE, FERRITIN, TIBC, IRON, RETICCTPCT in the last 72 hours. Sepsis Labs: Recent Labs  Lab 07/14/21 0327  WBC  5.8    Microbiology Recent Results (from the past 240 hour(s))  Resp Panel by RT-PCR (Flu A&B, Covid) Nasopharyngeal Swab     Status: None   Collection Time: 07/12/21  2:07 PM   Specimen: Nasopharyngeal Swab; Nasopharyngeal(NP) swabs in vial transport medium  Result Value Ref Range Status   SARS Coronavirus 2 by RT PCR NEGATIVE NEGATIVE Final    Comment: (NOTE) SARS-CoV-2 target nucleic acids are NOT DETECTED.  The SARS-CoV-2 RNA is generally detectable in upper respiratory specimens during the acute phase of infection. The lowest concentration of SARS-CoV-2 viral copies this assay can detect is 138 copies/mL. A negative result does not preclude SARS-Cov-2 infection and should not be used as the sole basis for treatment or other patient management decisions. A negative result may occur with  improper specimen collection/handling, submission of specimen other than nasopharyngeal swab, presence of viral mutation(s) within the areas targeted by this assay, and inadequate number of viral copies(<138 copies/mL). A negative result must be combined with clinical observations, patient history, and epidemiological information. The expected result is Negative.  Fact Sheet for Patients:  EntrepreneurPulse.com.au  Fact Sheet for Healthcare Providers:  IncredibleEmployment.be  This test is no t yet approved or  cleared by the [place] FDA and  has been authorized for detection and/or diagnosis of SARS-CoV-2 by FDA under an Emergency Use Authorization (EUA). This EUA will remain  in effect (meaning this test can be used) for the duration of the COVID-19 declaration under Section 564(b)(1) of the Act, 21 U.S.C.section 360bbb-3(b)(1), unless the authorization is terminated  or revoked sooner.       Influenza A by PCR NEGATIVE NEGATIVE Final   Influenza B by PCR NEGATIVE NEGATIVE Final    Comment: (NOTE) The Xpert Xpress SARS-CoV-2/FLU/RSV plus assay is intended as an aid in the diagnosis of influenza from Nasopharyngeal swab specimens and should not be used as a sole basis for treatment. Nasal washings and aspirates are unacceptable for Xpert Xpress SARS-CoV-2/FLU/RSV testing.  Fact Sheet for Patients: EntrepreneurPulse.com.au  Fact Sheet for Healthcare Providers: IncredibleEmployment.be  This test is not yet approved or cleared by the [place] FDA and has been authorized for detection and/or diagnosis of SARS-CoV-2 by FDA under an Emergency Use Authorization (EUA). This EUA will remain in effect (meaning this test can be used) for the duration of the COVID-19 declaration under Section 564(b)(1) of the Act, 21 U.S.C. section 360bbb-3(b)(1), unless the authorization is terminated or revoked.  Performed at [facility], [address] [place], [place] [address]     Procedures and diagnostic studies:  No results found.             LOS: 41 days   [place] Copywriter, advertising on [email or web address]. If 7PM-7AM, please contact night-coverage at [email or web address]     07/14/2021, 3:41 PM

## 2021-07-14 NOTE — Progress Notes (Signed)
Patient now ~6 weeks after sustaining L distal tibia and L distal radius fractures treated nonoperatively with immobilization. He reports minimal pain at these sites and making progress with PT.  RUE: +ain/pin/u motor SILT grossly r/u/m/ax +rad pulse RoM Mild tenderness about distal radius  RLE: + DF/PF/EHL SILT grossly over foot Foot wwp Mild tenderness about distal tibia/fibula  Recent radiographs show mildly increased angulation of both the distal tibia and distal radius.  However, there is callus formation noted about both fractures. Previously noted likely bony metastases present.   Updated recommendations: I discussed the radiographic findings with the patient.  He wishes to continue with conservative management, and I am in agreement with this plan. Can progress to WBAT on both RLE and RUE over next few days. Avoid forceful or sudden weightbearing.  Splints removed today Continue PT for RoM, progressive weight bearing, and strengthening exercises Patient can follow up with [facility] in ~4-6 weeks

## 2021-07-14 NOTE — Progress Notes (Signed)
OT Cancellation Note  Patient Details Name: Nino Amano MRN: [id number] DOB: July 12, 1961   Cancelled Treatment:    Reason Eval/Treat Not Completed: Other (comment) Pt with leader rounding at time of OT Attempt. Will f/u at later date/time for OT tx. Thank you.  Gerrianne Scale, [place], OTR/L ascom [phone] 07/14/21, 2:41 PM

## 2021-07-15 DIAGNOSIS — S82842A Displaced bimalleolar fracture of left lower leg, initial encounter for closed fracture: Secondary | ICD-10-CM

## 2021-07-15 NOTE — Progress Notes (Signed)
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  Pt recently here for multiple months with difficult d/c planning, did ultimatley d/c to homeless shelter and was gone only a few hours before fall with L ankle and wrist fractures with return to the ED.   OT comments  Pt seen for OT tx this date to f/u re: safety with ADLs/ADL mobility. OT ed with pt re: MD's guidance to progress as tolerable with WB to L UE/L LE. Pt with good understanding. Pt requires SETUP/SUPV for bridging technique to don elastic waist pants in the bed. Pt able to perform sup to sit with SUPV. He requires MOD A to CTS with RW (platform removed and pt tolerates well) from standard bed height. Able to CTS with MIN A only on second trial from bed elevated ~4". Pt educated re: importance of progressing fxl activity tolerance now that he is able to progress WB. Pt understands and is more agreeable this session than he has been historically. Will continue to follow acutely. Continue to anticipate he will require STR f/u as he is still requiring extensive assistance for ADL transfers and fxl mobility.    Recommendations for follow up therapy are one component of a multi-disciplinary discharge planning process, led by the attending physician.  Recommendations may be updated based on patient status, additional functional criteria and insurance authorization.    Follow Up Recommendations  SNF    Equipment Recommendations  None recommended by OT    Recommendations for Other Services      Precautions / Restrictions Precautions Precautions: Fall Precaution Comments: NWB LUE/LLE Restrictions Weight Bearing  Restrictions: No Other Position/Activity Restrictions: Per Dr. Serita Grit note on 10/5: "Can progress to WBAT on both RLE and RUE over next few days." splints removed 10/5       Mobility Bed Mobility Overal bed mobility: Needs Assistance Bed Mobility: Supine to Sit;Sit to Supine     Supine to sit: Supervision Sit to supine: Supervision        Transfers Overall transfer level: Needs assistance Equipment used: Rolling walker (2 wheeled) Transfers: Sit to/from Stand Sit to Stand: Mod assist         General transfer comment: MOD A from standard bed height, MIN A from elevated bed height, cues to sequence use of RW w/o platform    Balance Overall balance assessment: Needs assistance Sitting-balance support: No upper extremity supported Sitting balance-Leahy Scale: Good Sitting balance - Comments: G static sitting   Standing balance support: Bilateral upper extremity supported Standing balance-Leahy Scale: Fair Standing balance comment: requires UE support to sustain static stand                           ADL either performed or assessed with clinical judgement   ADL Overall ADL's : Needs assistance/impaired                     Lower Body Dressing: Bed level;Set up;Supervision/safety Lower Body Dressing Details (indicate cue type and reason): bridging in bed to don elastic waist pants  Vision Patient Visual Report: No change from baseline     Perception     Praxis      Cognition Arousal/Alertness: Awake/alert Behavior During Therapy: WFL for tasks assessed/performed Overall Cognitive Status: Within Functional Limits for tasks assessed                                 General Comments: aware that he is allowed to progress to WBAT on L side        Exercises Other Exercises Other Exercises: OT ed re: progressing with WB as tolerable for ADLs/ADL mobility. Pt with good understanding.   Shoulder  Instructions       General Comments      Pertinent Vitals/ Pain       Pain Assessment: Faces Faces Pain Scale: Hurts a little bit Pain Location: L shoulder Pain Descriptors / Indicators: Aching Pain Intervention(s): Monitored during session  Home Living                                          Prior Functioning/Environment              Frequency  Min 1X/week        Progress Toward Goals  OT Goals(current goals can now be found in the care plan section)  Progress towards OT goals: Progressing toward goals  Acute Rehab OT Goals Patient Stated Goal: to get stronger OT Goal Formulation: With patient Time For Goal Achievement: 07/31/21 Potential to Achieve Goals: Good  Plan Frequency remains appropriate;Discharge plan remains appropriate    Co-evaluation                 AM-PAC OT "6 Clicks" Daily Activity     Outcome Measure   Help from another person eating meals?: None Help from another person taking care of personal grooming?: A Little Help from another person toileting, which includes using toliet, bedpan, or urinal?: A Little Help from another person bathing (including washing, rinsing, drying)?: A Little Help from another person to put on and taking off regular upper body clothing?: A Little Help from another person to put on and taking off regular lower body clothing?: A Little 6 Click Score: 19    End of Session Equipment Utilized During Treatment: Rolling walker  OT Visit Diagnosis: Unsteadiness on feet (R26.81);Muscle weakness (generalized) (M62.81);History of falling (Z91.81) Pain - Right/Left: Left Pain - part of body: Arm   Activity Tolerance Patient tolerated treatment well   Patient Left in bed;with call bell/phone within reach;with bed alarm set   Nurse Communication Mobility status        Time: 2992-4268 OT Time Calculation (min): 32 min  Charges: OT General Charges $OT Visit: 1 Visit OT Treatments $Self  Care/Home Management : 8-22 mins $Therapeutic Activity: 8-22 mins  Gerrianne Scale, [place], OTR/L ascom [phone] 07/15/21, 5:37 PM

## 2021-07-15 NOTE — Plan of Care (Signed)
  Problem: Education: Goal: Knowledge of General Education information will improve Description: Including pain rating scale, medication(s)/side effects and non-pharmacologic comfort measures Outcome: Progressing   Problem: Health Behavior/Discharge Planning: Goal: Ability to manage health-related needs will improve Outcome: Progressing   Problem: Clinical Measurements: Goal: Ability to maintain clinical measurements within normal limits will improve Outcome: Progressing Goal: Will remain free from infection Outcome: Progressing Goal: Diagnostic test results will improve Outcome: Progressing Goal: Respiratory complications will improve Outcome: Progressing Goal: Cardiovascular complication will be avoided Outcome: Progressing   Problem: Activity: Goal: Risk for activity intolerance will decrease Outcome: Progressing   Problem: Nutrition: Goal: Adequate nutrition will be maintained Outcome: Progressing   Problem: Coping: Goal: Level of anxiety will decrease Outcome: Progressing   Problem: Elimination: Goal: Will not experience complications related to bowel motility Outcome: Progressing Goal: Will not experience complications related to urinary retention Outcome: Progressing   Problem: Pain Managment: Goal: General experience of comfort will improve Outcome: Progressing   Problem: Safety: Goal: Ability to remain free from injury will improve Outcome: Progressing   Problem: Skin Integrity: Goal: Risk for impaired skin integrity will decrease Outcome: Progressing   Problem: Education: Goal: Knowledge of the prescribed therapeutic regimen will improve Outcome: Progressing   Problem: Activity: Goal: Ability to increase mobility will improve Outcome: Progressing   Problem: Pain Management: Goal: Pain level will decrease with appropriate interventions Outcome: Progressing

## 2021-07-15 NOTE — Progress Notes (Signed)
Progress Note    Sarp Vernier  KYH:[id number] DOB: 07-03-1961  DOA: 06/02/2021 PCP: Pcp, No      Brief Narrative:    Medical records reviewed and are as summarized below:  Ryan [name] is a 60 y.o. 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 Patient was recently hospitalized from 6/21-8/24 due to metastasized prostate cancer. Pt was seen by [name] of oncology.  He was supposed to go to a homeless shelter but reportedly, he had issues when he was transported there.  He said he felt dizzy and fell accidentally causing severe pain in the left wrist and left ankle.  Work-up revealed fracture of the left distal radius and ulna and left distal tibial and fibular fractures.  He was treated with analgesics.  Orthopedic surgery recommended conservative management.  Splint has been removed.  Orthopedic is now recommending progressive weightbearing on upper and lower extremities.  Difficult disposition due to insurance.  Now had a bed offer which is not confirmed due to some LOC issues.  TOC is working on it  Patient with multiple life limiting comorbidities and advanced cancer.  High risk for deterioration and death.  Subjective. Patient was seen and examined today.  No new complaints.  Stating that he is improving and wants to stay independent if possible.  Assessment/Plan:   Principal Problem:   Closed left ankle fracture Active Problems:   Fall   Anemia of chronic disease   Protein-calorie malnutrition, severe (HCC)   Cancer cachexia ([place])   Prostate cancer metastatic to bone (HCC)   Left wrist fracture, closed, initial encounter   Hypocalcemia   Hyperkalemia   Closed fracture of left distal radius   Nutrition Problem: Severe Malnutrition Etiology: chronic illness, cancer and cancer related  treatments  Signs/Symptoms: severe fat depletion, severe muscle depletion   Body mass index is 16.34 kg/m.   Closed left ankle fracture and left wrist fracture: Orthopedic surgery recommended conservative management.  Orthopedic surgery remove the splint yesterday and is advising progressive weightbearing on left upper and lower extremities.  Outpatient follow-up recommended in 4 to 6 weeks postdischarge.   Stage IV prostate cancer with metastasis to the bone: PSA level has dropped from > 3000 (2 months ago) to 433 (1 month ago).  Outpatient follow-up with oncologist.  Hypocalcemia: Continue calcium,  Hyponatremia: Asymptomatic  Chronic intermittent hypotension: Continue midodrine  Other comorbidities include anemia of chronic disease, anxiety, depression, cachexia, insomnia  Tuberculin skin test was done on 07/09/2021 and this was negative.  Chest x-ray was requested as well but patient had a chest x-ray in July 2022 which did not show any changes concerning for tuberculosis.  Additionally, he does not have any symptoms to suggest tuberculosis.  There is no need to repeat chest x-ray at this time. Screening COVID test was negative. Awaiting placement to SNF-had 1 bed offer but there were some issues with LOC approval.  TOC is working on it  Diet Order             Diet regular Room service appropriate? Yes; Fluid consistency: Thin  Diet effective now                   Consultants: Oncologist Orthopedic surgeon  Procedures: None   Medications:    calcium carbonate  1,250 mg Oral TID  WC   feeding supplement  237 mL Oral QID   magnesium oxide  400 mg Oral Daily   midodrine  5 mg Oral TID WC   mirtazapine  15 mg Oral QHS   multivitamin with minerals  1 tablet Oral Daily   nicotine  21 mg Transdermal Daily   rivaroxaban  10 mg Oral Q supper   senna-docusate  1 tablet Oral BID   Continuous Infusions:  sodium chloride Stopped (06/04/21 1216)     Anti-infectives  (From admission, onward)    None       Family Communication/Anticipated D/C date and plan/Code Status   DVT prophylaxis: rivaroxaban (XARELTO) tablet 10 mg Start: 06/29/21 2215 rivaroxaban (XARELTO) tablet 10 mg     Code Status: DNR  Family Communication: None Disposition Plan:    Status is: Inpatient  Remains inpatient appropriate because:Unsafe d/c plan  Dispo: The patient is from: Home              Anticipated d/c is to: SNF              Patient currently is medically stable to d/c.   Difficult to place patient Yes   Objective:    Vitals:   07/15/21 0301 07/15/21 0915 07/15/21 1138 07/15/21 1504  BP: (!) 102/57 132/70 (!) 106/49 (!) 127/56  Pulse: 93 95 96 93  Resp: 16 15 18 14   Temp: 98.2 F (36.8 C) 98.7 F (37.1 C) (!) 97.4 F (36.3 C) 97.9 F (36.6 C)  TempSrc: Oral     SpO2: 100% 98% 95% 98%  Weight:      Height:       No data found.   Intake/Output Summary (Last 24 hours) at 07/15/2021 1652 Last data filed at 07/15/2021 1022 Gross per 24 hour  Intake 240 ml  Output 800 ml  Net -560 ml    Filed Weights   06/23/21 1545 07/06/21 1406 07/07/21 1443  Weight: 62.6 kg 65.6 kg 62.5 kg    Exam:  General.  Emaciated gentleman, in no acute distress. Pulmonary.  Lungs clear bilaterally, normal respiratory effort. CV.  Regular rate and rhythm, no JVD, rub or murmur. Abdomen.  Soft, nontender, nondistended, BS positive. CNS.  Alert and oriented .  No focal neurologic deficit. Extremities.  No edema, no cyanosis, pulses intact and symmetrical. Psychiatry.  Judgment and insight appears normal.    Data Reviewed:   I have personally reviewed following labs and imaging studies:  Labs: Labs show the following:   Basic Metabolic Panel: Recent Labs  Lab 07/14/21 0327  NA 134*  K 4.5  CL 106  CO2 22  GLUCOSE 104*  BUN 11  CREATININE 0.67  CALCIUM 8.4*    GFR Estimated Creatinine Clearance: 87.9 mL/min (by C-G formula based on SCr of 0.67  mg/dL). Liver Function Tests: No results for input(s): AST, ALT, ALKPHOS, BILITOT, PROT, ALBUMIN in the last 168 hours. No results for input(s): LIPASE, AMYLASE in the last 168 hours. No results for input(s): AMMONIA in the last 168 hours. Coagulation profile No results for input(s): INR, PROTIME in the last 168 hours.  CBC: Recent Labs  Lab 07/14/21 0327  WBC 5.8  HGB 9.1*  HCT 29.5*  MCV 84.8  PLT 218    Cardiac Enzymes: No results for input(s): CKTOTAL, CKMB, CKMBINDEX, TROPONINI in the last 168 hours. BNP (last 3 results) No results for input(s): PROBNP in the last 8760 hours. CBG: No results for input(s): GLUCAP in the last  168 hours.  D-Dimer: No results for input(s): DDIMER in the last 72 hours. Hgb A1c: No results for input(s): HGBA1C in the last 72 hours. Lipid Profile: No results for input(s): CHOL, HDL, LDLCALC, TRIG, CHOLHDL, LDLDIRECT in the last 72 hours. Thyroid function studies: No results for input(s): TSH, T4TOTAL, T3FREE, THYROIDAB in the last 72 hours.  Invalid input(s): FREET3 Anemia work up: No results for input(s): VITAMINB12, FOLATE, FERRITIN, TIBC, IRON, RETICCTPCT in the last 72 hours. Sepsis Labs: Recent Labs  Lab 07/14/21 0327  WBC 5.8     Microbiology Recent Results (from the past 240 hour(s))  Resp Panel by RT-PCR (Flu A&B, Covid) Nasopharyngeal Swab     Status: None   Collection Time: 07/12/21  2:07 PM   Specimen: Nasopharyngeal Swab; Nasopharyngeal(NP) swabs in vial transport medium  Result Value Ref Range Status   SARS Coronavirus 2 by RT PCR NEGATIVE NEGATIVE Final    Comment: (NOTE) SARS-CoV-2 target nucleic acids are NOT DETECTED.  The SARS-CoV-2 RNA is generally detectable in upper respiratory specimens during the acute phase of infection. The lowest concentration of SARS-CoV-2 viral copies this assay can detect is 138 copies/mL. A negative result does not preclude SARS-Cov-2 infection and should not be used as the sole  basis for treatment or other patient management decisions. A negative result may occur with  improper specimen collection/handling, submission of specimen other than nasopharyngeal swab, presence of viral mutation(s) within the areas targeted by this assay, and inadequate number of viral copies(<138 copies/mL). A negative result must be combined with clinical observations, patient history, and epidemiological information. The expected result is Negative.  Fact Sheet for Patients:  EntrepreneurPulse.com.au  Fact Sheet for Healthcare Providers:  IncredibleEmployment.be  This test is no t yet approved or cleared by the [place] FDA and  has been authorized for detection and/or diagnosis of SARS-CoV-2 by FDA under an Emergency Use Authorization (EUA). This EUA will remain  in effect (meaning this test can be used) for the duration of the COVID-19 declaration under Section 564(b)(1) of the Act, 21 U.S.C.section 360bbb-3(b)(1), unless the authorization is terminated  or revoked sooner.       Influenza A by PCR NEGATIVE NEGATIVE Final   Influenza B by PCR NEGATIVE NEGATIVE Final    Comment: (NOTE) The Xpert Xpress SARS-CoV-2/FLU/RSV plus assay is intended as an aid in the diagnosis of influenza from Nasopharyngeal swab specimens and should not be used as a sole basis for treatment. Nasal washings and aspirates are unacceptable for Xpert Xpress SARS-CoV-2/FLU/RSV testing.  Fact Sheet for Patients: EntrepreneurPulse.com.au  Fact Sheet for Healthcare Providers: IncredibleEmployment.be  This test is not yet approved or cleared by the [place] FDA and has been authorized for detection and/or diagnosis of SARS-CoV-2 by FDA under an Emergency Use Authorization (EUA). This EUA will remain in effect (meaning this test can be used) for the duration of the COVID-19 declaration under Section 564(b)(1) of the Act,  21 U.S.C. section 360bbb-3(b)(1), unless the authorization is terminated or revoked.  Performed at [facility], [address] [place], [place] [address]     Procedures and diagnostic studies:  No results found.   LOS: 42 days   [employer], advertising on [email or web address]. If 7PM-7AM, please contact night-coverage at [email or web address]  This record has been created using Systems analyst. Errors have been sought and corrected,but may not always be located. Such creation errors do not reflect on the standard of care.  07/15/2021, 4:52 PM

## 2021-07-16 NOTE — Progress Notes (Signed)
Progress Note    Ryan [name]  YSA:[id number] DOB: 1961-09-25  DOA: 06/02/2021 PCP: Pcp, No      Brief Narrative:    Medical records reviewed and are as summarized below:  Ryan [name] is a 60 y.o. 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 Patient was recently hospitalized from 6/21-8/24 due to metastasized prostate cancer. Pt was seen by Dr. Grayland [name] of oncology.  He was supposed to go to a homeless shelter but reportedly, he had issues when he was transported there.  He said he felt dizzy and fell accidentally causing severe pain in the left wrist and left ankle.  Work-up revealed fracture of the left distal radius and ulna and left distal tibial and fibular fractures.  He was treated with analgesics.  Orthopedic surgery recommended conservative management.  Splint has been removed.  Orthopedic is now recommending progressive weightbearing on upper and lower extremities.  Difficult disposition due to insurance.  Now had a bed offer which is not confirmed due to some LOC issues.  TOC is working on it  Patient with multiple life limiting comorbidities and advanced cancer.  High risk for deterioration and death.  Subjective. Patient was resting comfortably in bed when seen today.  Per patient he was able to walk around in the hall with PT.  Still waiting for SNF.  Assessment/Plan:   Principal Problem:   Closed left ankle fracture Active Problems:   Fall   Anemia of chronic disease   Protein-calorie malnutrition, severe (HCC)   Cancer cachexia ([place])   Prostate cancer metastatic to bone (HCC)   Left wrist fracture, closed, initial encounter   Hypocalcemia   Hyperkalemia   Closed fracture of left distal radius   Closed bimalleolar fracture of left ankle   Nutrition Problem: Severe  Malnutrition Etiology: chronic illness, cancer and cancer related treatments  Signs/Symptoms: severe fat depletion, severe muscle depletion   Body mass index is 16.34 kg/m.   Closed left ankle fracture and left wrist fracture: Orthopedic surgery recommended conservative management.  Orthopedic surgery remove the splint yesterday and is advising progressive weightbearing on left upper and lower extremities.  Outpatient follow-up recommended in 4 to 6 weeks postdischarge.   Stage IV prostate cancer with metastasis to the bone: PSA level has dropped from > 3000 (2 months ago) to 433 (1 month ago).  Outpatient follow-up with oncologist.  Hypocalcemia: Continue calcium,  Hyponatremia: Asymptomatic  Chronic intermittent hypotension: Continue midodrine  Other comorbidities include anemia of chronic disease, anxiety, depression, cachexia, insomnia  Tuberculin skin test was done on 07/09/2021 and this was negative.  Chest x-ray was requested as well but patient had a chest x-ray in July 2022 which did not show any changes concerning for tuberculosis.  Additionally, he does not have any symptoms to suggest tuberculosis.  There is no need to repeat chest x-ray at this time. Screening COVID test was negative. Awaiting placement to SNF-had 1 bed offer but there were some issues with LOC approval.  TOC is working on it  Diet Order             Diet regular Room service appropriate? Yes; Fluid consistency: Thin  Diet effective now                   Consultants: Oncologist Orthopedic surgeon  Procedures: None  Medications:    calcium carbonate  1,250 mg Oral TID WC   feeding supplement  237 mL Oral QID   magnesium oxide  400 mg Oral Daily   midodrine  5 mg Oral TID WC   mirtazapine  15 mg Oral QHS   multivitamin with minerals  1 tablet Oral Daily   nicotine  21 mg Transdermal Daily   rivaroxaban  10 mg Oral Q supper   senna-docusate  1 tablet Oral BID   Continuous  Infusions:  sodium chloride Stopped (06/04/21 1216)     Anti-infectives (From admission, onward)    None       Family Communication/Anticipated D/C date and plan/Code Status   DVT prophylaxis: rivaroxaban (XARELTO) tablet 10 mg Start: 06/29/21 2215 rivaroxaban (XARELTO) tablet 10 mg     Code Status: DNR  Family Communication: None Disposition Plan:    Status is: Inpatient  Remains inpatient appropriate because:Unsafe d/c plan  Dispo: The patient is from: Home              Anticipated d/c is to: SNF              Patient currently is medically stable to d/c.   Difficult to place patient Yes   Objective:    Vitals:   07/16/21 0424 07/16/21 0813 07/16/21 1213 07/16/21 1550  BP: 113/62 (!) 128/59 (!) 95/59 (!) 95/51  Pulse: (!) 106 97 100 94  Resp: 16 18 17 17   Temp: 98 F (36.7 C) 98 F (36.7 C) 97.9 F (36.6 C) 97.9 F (36.6 C)  TempSrc: Oral     SpO2: 96% 97% 96% 97%  Weight:      Height:       No data found.   Intake/Output Summary (Last 24 hours) at 07/16/2021 1618 Last data filed at 07/16/2021 0424 Gross per 24 hour  Intake --  Output 1000 ml  Net -1000 ml    Filed Weights   06/23/21 1545 07/06/21 1406 07/07/21 1443  Weight: 62.6 kg 65.6 kg 62.5 kg    Exam:  General.  Malnourished gentleman, in no acute distress. Pulmonary.  Lungs clear bilaterally, normal respiratory effort. CV.  Regular rate and rhythm, no JVD, rub or murmur. Abdomen.  Soft, nontender, nondistended, BS positive. CNS.  Alert and oriented.  No focal neurologic deficit. Extremities.  No edema, no cyanosis, pulses intact and symmetrical. Psychiatry.  Judgment and insight appears normal.    Data Reviewed:   I have personally reviewed following labs and imaging studies:  Labs: Labs show the following:   Basic Metabolic Panel: Recent Labs  Lab 07/14/21 0327  NA 134*  K 4.5  CL 106  CO2 22  GLUCOSE 104*  BUN 11  CREATININE 0.67  CALCIUM 8.4*    GFR Estimated  Creatinine Clearance: 87.9 mL/min (by C-G formula based on SCr of 0.67 mg/dL). Liver Function Tests: No results for input(s): AST, ALT, ALKPHOS, BILITOT, PROT, ALBUMIN in the last 168 hours. No results for input(s): LIPASE, AMYLASE in the last 168 hours. No results for input(s): AMMONIA in the last 168 hours. Coagulation profile No results for input(s): INR, PROTIME in the last 168 hours.  CBC: Recent Labs  Lab 07/14/21 0327  WBC 5.8  HGB 9.1*  HCT 29.5*  MCV 84.8  PLT 218    Cardiac Enzymes: No results for input(s): CKTOTAL, CKMB, CKMBINDEX, TROPONINI in the last 168 hours. BNP (last 3 results) No results for input(s): PROBNP in the last 8760 hours.  CBG: No results for input(s): GLUCAP in the last 168 hours.  D-Dimer: No results for input(s): DDIMER in the last 72 hours. Hgb A1c: No results for input(s): HGBA1C in the last 72 hours. Lipid Profile: No results for input(s): CHOL, HDL, LDLCALC, TRIG, CHOLHDL, LDLDIRECT in the last 72 hours. Thyroid function studies: No results for input(s): TSH, T4TOTAL, T3FREE, THYROIDAB in the last 72 hours.  Invalid input(s): FREET3 Anemia work up: No results for input(s): VITAMINB12, FOLATE, FERRITIN, TIBC, IRON, RETICCTPCT in the last 72 hours. Sepsis Labs: Recent Labs  Lab 07/14/21 0327  WBC 5.8     Microbiology Recent Results (from the past 240 hour(s))  Resp Panel by RT-PCR (Flu A&B, Covid) Nasopharyngeal Swab     Status: None   Collection Time: 07/12/21  2:07 PM   Specimen: Nasopharyngeal Swab; Nasopharyngeal(NP) swabs in vial transport medium  Result Value Ref Range Status   SARS Coronavirus 2 by RT PCR NEGATIVE NEGATIVE Final    Comment: (NOTE) SARS-CoV-2 target nucleic acids are NOT DETECTED.  The SARS-CoV-2 RNA is generally detectable in upper respiratory specimens during the acute phase of infection. The lowest concentration of SARS-CoV-2 viral copies this assay can detect is 138 copies/mL. A negative result does  not preclude SARS-Cov-2 infection and should not be used as the sole basis for treatment or other patient management decisions. A negative result may occur with  improper specimen collection/handling, submission of specimen other than nasopharyngeal swab, presence of viral mutation(s) within the areas targeted by this assay, and inadequate number of viral copies(<138 copies/mL). A negative result must be combined with clinical observations, patient history, and epidemiological information. The expected result is Negative.  Fact Sheet for Patients:  EntrepreneurPulse.com.au  Fact Sheet for Healthcare Providers:  IncredibleEmployment.be  This test is no t yet approved or cleared by the [place] FDA and  has been authorized for detection and/or diagnosis of SARS-CoV-2 by FDA under an Emergency Use Authorization (EUA). This EUA will remain  in effect (meaning this test can be used) for the duration of the COVID-19 declaration under Section 564(b)(1) of the Act, 21 U.S.C.section 360bbb-3(b)(1), unless the authorization is terminated  or revoked sooner.       Influenza A by PCR NEGATIVE NEGATIVE Final   Influenza B by PCR NEGATIVE NEGATIVE Final    Comment: (NOTE) The Xpert Xpress SARS-CoV-2/FLU/RSV plus assay is intended as an aid in the diagnosis of influenza from Nasopharyngeal swab specimens and should not be used as a sole basis for treatment. Nasal washings and aspirates are unacceptable for Xpert Xpress SARS-CoV-2/FLU/RSV testing.  Fact Sheet for Patients: EntrepreneurPulse.com.au  Fact Sheet for Healthcare Providers: IncredibleEmployment.be  This test is not yet approved or cleared by the [place] FDA and has been authorized for detection and/or diagnosis of SARS-CoV-2 by FDA under an Emergency Use Authorization (EUA). This EUA will remain in effect (meaning this test can be used) for the  duration of the COVID-19 declaration under Section 564(b)(1) of the Act, 21 U.S.C. section 360bbb-3(b)(1), unless the authorization is terminated or revoked.  Performed at [facility], [address] [place], [place] [address]     Procedures and diagnostic studies:  No results found.   LOS: 31 days   [employer], advertising on [email or web address]. If 7PM-7AM, please contact night-coverage at [email or web address]  This record has been created using Systems analyst. Errors have been sought and corrected,but may not always be located. Such creation errors do  not reflect on the standard of care.    07/16/2021, 4:18 PM

## 2021-07-16 NOTE — Progress Notes (Signed)
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  Pt recently here for multiple months with difficult d/c planning, did ultimatley d/c to homeless shelter and was gone only a few hours before fall with L ankle and wrist fractures with return to the ED.    PT Comments    Pt was long sitting in bed upon arriving. He is alert and oriented x 3. Educated on being WBAT and need for OOB activity. He was easily able to exit L side of bed without physical assistance. Stood from slightly elevated bed height to RW and ambulated to doorway and return. Slow antalgic step to pattern. Distance limited by opt's willingness. He required min assist throughout gait training for safety. Does endorse fatigue with minimal gait distances.Highly recommend DC to rehab to address deficits while maximizing independence with ADLs.   Recommendations for follow up therapy are one component of a multi-disciplinary discharge planning process, led by the attending physician.  Recommendations may be updated based on patient status, additional functional criteria and insurance authorization.  Follow Up Recommendations  SNF;Supervision/Assistance - 24 hour     Equipment Recommendations  Rolling walker with 5" wheels;3in1 (PT)       Precautions / Restrictions Precautions Precautions: Fall Precaution Comments: WBAT Restrictions Weight Bearing Restrictions: No LUE Weight Bearing: Weight bearing as tolerated LLE Weight Bearing: Weight bearing as tolerated     Mobility  Bed Mobility Overal bed mobility: Needs Assistance Bed Mobility: Supine to Sit;Sit to Supine Rolling: Supervision Sidelying to sit:  Supervision Supine to sit: Supervision Sit to supine: Supervision   General bed mobility comments: no physical assistance required to exit bed or returnt o supie in bed after OOB activity    Transfers Overall transfer level: Needs assistance Equipment used: Rolling walker (2 wheeled) Transfers: Sit to/from Stand Sit to Stand: Min assist;Mod assist;From elevated surface         General transfer comment: min-mod assist fo one to stand form slightly elevated bed height. pt c/o slight L wristr discomfort buty overall did not limit abilities  Ambulation/Gait Ambulation/Gait assistance: Min assist Gait Distance (Feet): 20 Feet Assistive device: Rolling walker (2 wheeled) Gait Pattern/deviations: Step-to pattern;Antalgic Gait velocity: decreased   General Gait Details: Pt was able to ambulate to doorway of room and return with slow step to pattern. Vcs throughout for imporve posture and gait sequencing. Overall tolerated well but reports feel fatigued afterwards. HR elevated to 133bpm and ssao2 > 92%    Balance Overall balance assessment: Needs assistance Sitting-balance support: No upper extremity supported Sitting balance-Leahy Scale: Good     Standing balance support: Bilateral upper extremity supported Standing balance-Leahy Scale: Fair      Cognition Arousal/Alertness: Awake/alert Behavior During Therapy: WFL for tasks assessed/performed Overall Cognitive Status: Within Functional Limits for tasks assessed      General Comments: Pt is A and O x 4. Pt has newer outlook today." I'm starting frsh with everything man." I'll try whatever you want."             Pertinent Vitals/Pain Pain Assessment: 0-10 Pain Score: 3  Faces Pain Scale: Hurts a little bit Pain Location: L shoulder Pain Intervention(s): Limited activity within patient's tolerance;Monitored during  session;Premedicated before session;Repositioned     PT Goals (current goals can now be found in the care  plan section) Acute Rehab PT Goals Patient Stated Goal: to get stronger Progress towards PT goals: Progressing toward goals    Frequency    Min 2X/week      PT Plan Current plan remains appropriate    Co-evaluation     PT goals addressed during session: Mobility/safety with mobility;Balance;Proper use of DME;Strengthening/ROM        AM-PAC PT "6 Clicks" Mobility   Outcome Measure  Help needed turning from your back to your side while in a flat bed without using bedrails?: None Help needed moving from lying on your back to sitting on the side of a flat bed without using bedrails?: A Little Help needed moving to and from a bed to a chair (including a wheelchair)?: A Little Help needed standing up from a chair using your arms (e.g., wheelchair or bedside chair)?: A Little Help needed to walk in hospital room?: A Little Help needed climbing 3-5 steps with a railing? : A Lot 6 Click Score: 18    End of Session Equipment Utilized During Treatment: Gait belt Activity Tolerance: Patient tolerated treatment well Patient left: in bed;with call bell/phone within reach;with bed alarm set Nurse Communication: Mobility status PT Visit Diagnosis: Unsteadiness on feet (R26.81);Muscle weakness (generalized) (M62.81);Difficulty in walking, not elsewhere classified (R26.2);History of falling (Z91.81) Pain - Right/Left: Left Pain - part of body: Ankle and joints of foot     Time: 2426-8341 PT Time Calculation (min) (ACUTE ONLY): 25 min  Charges:  $Gait Training: 8-22 mins $Therapeutic Activity: 8-22 mins                     Julaine Fusi PTA 07/16/21, 9:22 AM

## 2021-07-16 NOTE — TOC Progression Note (Signed)
Transition of Care [facility]) - Progression Note    Patient Details  Name: Ryan [name] MRN: [id number] Date of Birth: [birth year]  Transition of [facility]) CM/SW [place], RN Phone Number: 07/16/2021, 2:01 PM  Clinical Narrative:    Reached out to [facility] at [facility], left a VM, awaiting a call back        Expected Discharge Plan and Services                                                 Social Determinants of Health (SDOH) Interventions    Readmission Risk Interventions Readmission Risk Prevention Plan 04/23/2021  Transportation Screening Complete  PCP or Specialist Appt within 3-5 Days Complete  HRI or [place] Complete  Social Work Consult for [place] Planning/Counseling Not Complete  SW consult not completed comments RNCM assigned to patient  Palliative Care Screening Complete  Medication Review Press photographer) Complete  Some recent data might be hidden

## 2021-07-17 NOTE — Progress Notes (Signed)
Progress Note    Ryan [name]  TKW:[id number] DOB: [birth year]  DOA: 06/02/2021 PCP: Pcp, No      Brief Narrative:    Medical records reviewed and are as summarized below:  Ryan [name] is a 60 y.o. 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 Patient was recently hospitalized from 6/21-8/24 due to metastasized prostate cancer. Pt was seen by [name] of oncology.  He was supposed to go to a homeless shelter but reportedly, he had issues when he was transported there.  He said he felt dizzy and fell accidentally causing severe pain in the left wrist and left ankle.  Work-up revealed fracture of the left distal radius and ulna and left distal tibial and fibular fractures.  He was treated with analgesics.  Orthopedic surgery recommended conservative management.  Splint has been removed.  Orthopedic is now recommending progressive weightbearing on upper and lower extremities.  Difficult disposition due to insurance.  Now had a bed offer which is not confirmed due to some LOC issues.  TOC is working on it  Patient with multiple life limiting comorbidities and advanced cancer.  High risk for deterioration and death.  Subjective. Patient was seen and examined today.  No new complaints.  He wants to get some sleep.  Assessment/Plan:   Principal Problem:   Closed left ankle fracture Active Problems:   Fall   Anemia of chronic disease   Protein-calorie malnutrition, severe (HCC)   Cancer cachexia ([place])   Prostate cancer metastatic to bone (HCC)   Left wrist fracture, closed, initial encounter   Hypocalcemia   Hyperkalemia   Closed fracture of left distal radius   Closed bimalleolar fracture of left ankle   Nutrition Problem: Severe Malnutrition Etiology: chronic illness, cancer and cancer related  treatments  Signs/Symptoms: severe fat depletion, severe muscle depletion   Body mass index is 16.34 kg/m.   Closed left ankle fracture and left wrist fracture: Orthopedic surgery recommended conservative management.  Orthopedic surgery remove the splint yesterday and is advising progressive weightbearing on left upper and lower extremities.  Outpatient follow-up recommended in 4 to 6 weeks postdischarge.   Stage IV prostate cancer with metastasis to the bone: PSA level has dropped from > 3000 (2 months ago) to 433 (1 month ago).  Outpatient follow-up with oncologist.  Hypocalcemia: Continue calcium,  Hyponatremia: Asymptomatic  Chronic intermittent hypotension: Continue midodrine  Other comorbidities include anemia of chronic disease, anxiety, depression, cachexia, insomnia  Tuberculin skin test was done on 07/09/2021 and this was negative.  Chest x-ray was requested as well but patient had a chest x-ray in July 2022 which did not show any changes concerning for tuberculosis.  Additionally, he does not have any symptoms to suggest tuberculosis.  There is no need to repeat chest x-ray at this time. Screening COVID test was negative. Awaiting placement to SNF-had 1 bed offer but there were some issues with LOC approval.  TOC is working on it  Diet Order             Diet regular Room service appropriate? Yes; Fluid consistency: Thin  Diet effective now                   Consultants: Oncologist Orthopedic surgeon  Procedures: None   Medications:    calcium carbonate  1,250 mg  Oral TID WC   feeding supplement  237 mL Oral QID   magnesium oxide  400 mg Oral Daily   midodrine  5 mg Oral TID WC   mirtazapine  15 mg Oral QHS   multivitamin with minerals  1 tablet Oral Daily   nicotine  21 mg Transdermal Daily   rivaroxaban  10 mg Oral Q supper   senna-docusate  1 tablet Oral BID   Continuous Infusions:  sodium chloride Stopped (06/04/21 1216)     Anti-infectives  (From admission, onward)    None       Family Communication/Anticipated D/C date and plan/Code Status   DVT prophylaxis: rivaroxaban (XARELTO) tablet 10 mg Start: 06/29/21 2215 rivaroxaban (XARELTO) tablet 10 mg     Code Status: DNR  Family Communication: None Disposition Plan:    Status is: Inpatient  Remains inpatient appropriate because:Unsafe d/c plan  Dispo: The patient is from: Home              Anticipated d/c is to: SNF              Patient currently is medically stable to d/c.   Difficult to place patient Yes   Objective:    Vitals:   07/16/21 2322 07/17/21 0351 07/17/21 0735 07/17/21 1509  BP: (!) 119/56 (!) 102/58 (!) 93/54 (!) 103/50  Pulse: (!) 101 (!) 105 97 99  Resp: 15 16 14 14   Temp: (!) 97.4 F (36.3 C) 98.1 F (36.7 C) 97.9 F (36.6 C) 98.5 F (36.9 C)  TempSrc: Oral Oral    SpO2: 100% 97% 98% 97%  Weight:      Height:       No data found.   Intake/Output Summary (Last 24 hours) at 07/17/2021 1615 Last data filed at 07/17/2021 1509 Gross per 24 hour  Intake 400 ml  Output 1450 ml  Net -1050 ml    Filed Weights   06/23/21 1545 07/06/21 1406 07/07/21 1443  Weight: 62.6 kg 65.6 kg 62.5 kg    Exam:  General.  Malnourished gentleman, in no acute distress. Pulmonary.  Lungs clear bilaterally, normal respiratory effort. CV.  Regular rate and rhythm, no JVD, rub or murmur. Abdomen.  Soft, nontender, nondistended, BS positive. CNS.  Alert and oriented x3.  No focal neurologic deficit. Extremities.  No edema, no cyanosis, pulses intact and symmetrical. Psychiatry.  Judgment and insight appears normal.   Data Reviewed:   I have personally reviewed following labs and imaging studies:  Labs: Labs show the following:   Basic Metabolic Panel: Recent Labs  Lab 07/14/21 0327  NA 134*  K 4.5  CL 106  CO2 22  GLUCOSE 104*  BUN 11  CREATININE 0.67  CALCIUM 8.4*    GFR Estimated Creatinine Clearance: 87.9 mL/min (by C-G formula  based on SCr of 0.67 mg/dL). Liver Function Tests: No results for input(s): AST, ALT, ALKPHOS, BILITOT, PROT, ALBUMIN in the last 168 hours. No results for input(s): LIPASE, AMYLASE in the last 168 hours. No results for input(s): AMMONIA in the last 168 hours. Coagulation profile No results for input(s): INR, PROTIME in the last 168 hours.  CBC: Recent Labs  Lab 07/14/21 0327  WBC 5.8  HGB 9.1*  HCT 29.5*  MCV 84.8  PLT 218    Cardiac Enzymes: No results for input(s): CKTOTAL, CKMB, CKMBINDEX, TROPONINI in the last 168 hours. BNP (last 3 results) No results for input(s): PROBNP in the last 8760 hours. CBG: No results for input(s):  GLUCAP in the last 168 hours.  D-Dimer: No results for input(s): DDIMER in the last 72 hours. Hgb A1c: No results for input(s): HGBA1C in the last 72 hours. Lipid Profile: No results for input(s): CHOL, HDL, LDLCALC, TRIG, CHOLHDL, LDLDIRECT in the last 72 hours. Thyroid function studies: No results for input(s): TSH, T4TOTAL, T3FREE, THYROIDAB in the last 72 hours.  Invalid input(s): FREET3 Anemia work up: No results for input(s): VITAMINB12, FOLATE, FERRITIN, TIBC, IRON, RETICCTPCT in the last 72 hours. Sepsis Labs: Recent Labs  Lab 07/14/21 0327  WBC 5.8     Microbiology Recent Results (from the past 240 hour(s))  Resp Panel by RT-PCR (Flu A&B, Covid) Nasopharyngeal Swab     Status: None   Collection Time: 07/12/21  2:07 PM   Specimen: Nasopharyngeal Swab; Nasopharyngeal(NP) swabs in vial transport medium  Result Value Ref Range Status   SARS Coronavirus 2 by RT PCR NEGATIVE NEGATIVE Final    Comment: (NOTE) SARS-CoV-2 target nucleic acids are NOT DETECTED.  The SARS-CoV-2 RNA is generally detectable in upper respiratory specimens during the acute phase of infection. The lowest concentration of SARS-CoV-2 viral copies this assay can detect is 138 copies/mL. A negative result does not preclude SARS-Cov-2 infection and should not  be used as the sole basis for treatment or other patient management decisions. A negative result may occur with  improper specimen collection/handling, submission of specimen other than nasopharyngeal swab, presence of viral mutation(s) within the areas targeted by this assay, and inadequate number of viral copies(<138 copies/mL). A negative result must be combined with clinical observations, patient history, and epidemiological information. The expected result is Negative.  Fact Sheet for Patients:  EntrepreneurPulse.com.au  Fact Sheet for Healthcare Providers:  IncredibleEmployment.be  This test is no t yet approved or cleared by the [place] FDA and  has been authorized for detection and/or diagnosis of SARS-CoV-2 by FDA under an Emergency Use Authorization (EUA). This EUA will remain  in effect (meaning this test can be used) for the duration of the COVID-19 declaration under Section 564(b)(1) of the Act, 21 U.S.C.section 360bbb-3(b)(1), unless the authorization is terminated  or revoked sooner.       Influenza A by PCR NEGATIVE NEGATIVE Final   Influenza B by PCR NEGATIVE NEGATIVE Final    Comment: (NOTE) The Xpert Xpress SARS-CoV-2/FLU/RSV plus assay is intended as an aid in the diagnosis of influenza from Nasopharyngeal swab specimens and should not be used as a sole basis for treatment. Nasal washings and aspirates are unacceptable for Xpert Xpress SARS-CoV-2/FLU/RSV testing.  Fact Sheet for Patients: EntrepreneurPulse.com.au  Fact Sheet for Healthcare Providers: IncredibleEmployment.be  This test is not yet approved or cleared by the [place] FDA and has been authorized for detection and/or diagnosis of SARS-CoV-2 by FDA under an Emergency Use Authorization (EUA). This EUA will remain in effect (meaning this test can be used) for the duration of the COVID-19 declaration under Section  564(b)(1) of the Act, 21 U.S.C. section 360bbb-3(b)(1), unless the authorization is terminated or revoked.  Performed at [facility], [address] [place],  [address]     Procedures and diagnostic studies:  No results found.   LOS: 64 days   [place] Copywriter, advertising on [email or web address]. If 7PM-7AM, please contact night-coverage at [email or web address]  This record has been created using Systems analyst. Errors have been sought and corrected,but may not always be located. Such creation errors do not reflect on the standard  of care.    07/17/2021, 4:15 PM

## 2021-07-18 NOTE — Plan of Care (Signed)
  Problem: Education: Goal: Knowledge of General Education information will improve Description: Including pain rating scale, medication(s)/side effects and non-pharmacologic comfort measures Outcome: Progressing   Problem: Health Behavior/Discharge Planning: Goal: Ability to manage health-related needs will improve Outcome: Progressing   Problem: Clinical Measurements: Goal: Ability to maintain clinical measurements within normal limits will improve Outcome: Progressing Goal: Will remain free from infection Outcome: Progressing Goal: Diagnostic test results will improve Outcome: Progressing Goal: Respiratory complications will improve Outcome: Progressing Goal: Cardiovascular complication will be avoided Outcome: Progressing   Problem: Activity: Goal: Risk for activity intolerance will decrease Outcome: Progressing   Problem: Nutrition: Goal: Adequate nutrition will be maintained Outcome: Progressing   Problem: Coping: Goal: Level of anxiety will decrease Outcome: Progressing   Problem: Elimination: Goal: Will not experience complications related to bowel motility Outcome: Progressing Goal: Will not experience complications related to urinary retention Outcome: Progressing   Problem: Pain Managment: Goal: General experience of comfort will improve Outcome: Progressing   Problem: Safety: Goal: Ability to remain free from injury will improve Outcome: Progressing   Problem: Skin Integrity: Goal: Risk for impaired skin integrity will decrease Outcome: Progressing   Problem: Education: Goal: Knowledge of the prescribed therapeutic regimen will improve Outcome: Progressing   Problem: Activity: Goal: Ability to increase mobility will improve Outcome: Progressing   Problem: Pain Management: Goal: Pain level will decrease with appropriate interventions Outcome: Progressing

## 2021-07-18 NOTE — Progress Notes (Signed)
Progress Note    Ryan [name]  AVW:[id number] DOB: 07/06/1961  DOA: 06/02/2021 PCP: Pcp, No      Brief Narrative:    Medical records reviewed and are as summarized below:  Ryan [name] is a 60 y.o. 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 Patient was recently hospitalized from 6/21-8/24 due to metastasized prostate cancer. Pt was seen by [name] of oncology.  He was supposed to go to a homeless shelter but reportedly, he had issues when he was transported there.  He said he felt dizzy and fell accidentally causing severe pain in the left wrist and left ankle.  Work-up revealed fracture of the left distal radius and ulna and left distal tibial and fibular fractures.  He was treated with analgesics.  Orthopedic surgery recommended conservative management.  Splint has been removed.  Orthopedic is now recommending progressive weightbearing on upper and lower extremities.  Difficult disposition due to insurance.  Now had a bed offer which is not confirmed due to some LOC issues.  TOC is working on it  Patient with multiple life limiting comorbidities and advanced cancer.  High risk for deterioration and death.  Subjective. Patient was seen and examined today.  No new complaints.  Watching TV comfortably.  Able to walk with PT in the hall.  Assessment/Plan:   Principal Problem:   Closed left ankle fracture Active Problems:   Fall   Anemia of chronic disease   Protein-calorie malnutrition, severe (HCC)   Cancer cachexia ([place])   Prostate cancer metastatic to bone (HCC)   Left wrist fracture, closed, initial encounter   Hypocalcemia   Hyperkalemia   Closed fracture of left distal radius   Closed bimalleolar fracture of left ankle   Nutrition Problem: Severe Malnutrition Etiology: chronic  illness, cancer and cancer related treatments  Signs/Symptoms: severe fat depletion, severe muscle depletion   Body mass index is 16.34 kg/m.   Closed left ankle fracture and left wrist fracture: Orthopedic surgery recommended conservative management.  Orthopedic surgery remove the splint yesterday and is advising progressive weightbearing on left upper and lower extremities.  Outpatient follow-up recommended in 4 to 6 weeks postdischarge.   Stage IV prostate cancer with metastasis to the bone: PSA level has dropped from > 3000 (2 months ago) to 433 (1 month ago).  Outpatient follow-up with oncologist.  Hypocalcemia: Continue calcium,  Hyponatremia: Asymptomatic  Chronic intermittent hypotension: Continue midodrine  Other comorbidities include anemia of chronic disease, anxiety, depression, cachexia, insomnia  Tuberculin skin test was done on 07/09/2021 and this was negative.  Chest x-ray was requested as well but patient had a chest x-ray in July 2022 which did not show any changes concerning for tuberculosis.  Additionally, he does not have any symptoms to suggest tuberculosis.  There is no need to repeat chest x-ray at this time. Screening COVID test was negative. Awaiting placement to SNF-had 1 bed offer but there were some issues with LOC approval.  TOC is working on it  Diet Order             Diet regular Room service appropriate? Yes; Fluid consistency: Thin  Diet effective now                   Consultants: Oncologist Orthopedic surgeon  Procedures: None   Medications:  calcium carbonate  1,250 mg Oral TID WC   feeding supplement  237 mL Oral QID   magnesium oxide  400 mg Oral Daily   midodrine  5 mg Oral TID WC   mirtazapine  15 mg Oral QHS   multivitamin with minerals  1 tablet Oral Daily   nicotine  21 mg Transdermal Daily   rivaroxaban  10 mg Oral Q supper   senna-docusate  1 tablet Oral BID   Continuous Infusions:  sodium chloride Stopped  (06/04/21 1216)     Anti-infectives (From admission, onward)    None       Family Communication/Anticipated D/C date and plan/Code Status   DVT prophylaxis: rivaroxaban (XARELTO) tablet 10 mg Start: 06/29/21 2215 rivaroxaban (XARELTO) tablet 10 mg     Code Status: DNR  Family Communication: None Disposition Plan:    Status is: Inpatient  Remains inpatient appropriate because:Unsafe d/c plan  Dispo: The patient is from: Home              Anticipated d/c is to: SNF              Patient currently is medically stable to d/c.   Difficult to place patient Yes   Objective:    Vitals:   07/17/21 0735 07/17/21 1509 07/17/21 2100 07/18/21 0337  BP: (!) 93/54 (!) 103/50 (!) 105/53 (!) 115/51  Pulse: 97 99 100 99  Resp: 14 14 16 16   Temp: 97.9 F (36.6 C) 98.5 F (36.9 C) 98 F (36.7 C) 98 F (36.7 C)  TempSrc:   Oral   SpO2: 98% 97% 100% 99%  Weight:      Height:       No data found.   Intake/Output Summary (Last 24 hours) at 07/18/2021 0749 Last data filed at 07/18/2021 0200 Gross per 24 hour  Intake --  Output 1650 ml  Net -1650 ml    Filed Weights   06/23/21 1545 07/06/21 1406 07/07/21 1443  Weight: 62.6 kg 65.6 kg 62.5 kg    Exam:  General.  Emaciated gentleman, in no acute distress. Pulmonary.  Lungs clear bilaterally, normal respiratory effort. CV.  Regular rate and rhythm, no JVD, rub or murmur. Abdomen.  Soft, nontender, nondistended, BS positive. CNS.  Alert and oriented x3.  No focal neurologic deficit. Extremities.  No edema, no cyanosis, pulses intact and symmetrical. Psychiatry.  Judgment and insight appears normal.   Data Reviewed:   I have personally reviewed following labs and imaging studies:  Labs: Labs show the following:   Basic Metabolic Panel: Recent Labs  Lab 07/14/21 0327  NA 134*  K 4.5  CL 106  CO2 22  GLUCOSE 104*  BUN 11  CREATININE 0.67  CALCIUM 8.4*    GFR Estimated Creatinine Clearance: 87.9 mL/min (by  C-G formula based on SCr of 0.67 mg/dL). Liver Function Tests: No results for input(s): AST, ALT, ALKPHOS, BILITOT, PROT, ALBUMIN in the last 168 hours. No results for input(s): LIPASE, AMYLASE in the last 168 hours. No results for input(s): AMMONIA in the last 168 hours. Coagulation profile No results for input(s): INR, PROTIME in the last 168 hours.  CBC: Recent Labs  Lab 07/14/21 0327  WBC 5.8  HGB 9.1*  HCT 29.5*  MCV 84.8  PLT 218    Cardiac Enzymes: No results for input(s): CKTOTAL, CKMB, CKMBINDEX, TROPONINI in the last 168 hours. BNP (last 3 results) No results for input(s): PROBNP in the last 8760 hours. CBG: No results for  input(s): GLUCAP in the last 168 hours.  D-Dimer: No results for input(s): DDIMER in the last 72 hours. Hgb A1c: No results for input(s): HGBA1C in the last 72 hours. Lipid Profile: No results for input(s): CHOL, HDL, LDLCALC, TRIG, CHOLHDL, LDLDIRECT in the last 72 hours. Thyroid function studies: No results for input(s): TSH, T4TOTAL, T3FREE, THYROIDAB in the last 72 hours.  Invalid input(s): FREET3 Anemia work up: No results for input(s): VITAMINB12, FOLATE, FERRITIN, TIBC, IRON, RETICCTPCT in the last 72 hours. Sepsis Labs: Recent Labs  Lab 07/14/21 0327  WBC 5.8     Microbiology Recent Results (from the past 240 hour(s))  Resp Panel by RT-PCR (Flu A&B, Covid) Nasopharyngeal Swab     Status: None   Collection Time: 07/12/21  2:07 PM   Specimen: Nasopharyngeal Swab; Nasopharyngeal(NP) swabs in vial transport medium  Result Value Ref Range Status   SARS Coronavirus 2 by RT PCR NEGATIVE NEGATIVE Final    Comment: (NOTE) SARS-CoV-2 target nucleic acids are NOT DETECTED.  The SARS-CoV-2 RNA is generally detectable in upper respiratory specimens during the acute phase of infection. The lowest concentration of SARS-CoV-2 viral copies this assay can detect is 138 copies/mL. A negative result does not preclude SARS-Cov-2 infection  and should not be used as the sole basis for treatment or other patient management decisions. A negative result may occur with  improper specimen collection/handling, submission of specimen other than nasopharyngeal swab, presence of viral mutation(s) within the areas targeted by this assay, and inadequate number of viral copies(<138 copies/mL). A negative result must be combined with clinical observations, patient history, and epidemiological information. The expected result is Negative.  Fact Sheet for Patients:  EntrepreneurPulse.com.au  Fact Sheet for Healthcare Providers:  IncredibleEmployment.be  This test is no t yet approved or cleared by the [place] FDA and  has been authorized for detection and/or diagnosis of SARS-CoV-2 by FDA under an Emergency Use Authorization (EUA). This EUA will remain  in effect (meaning this test can be used) for the duration of the COVID-19 declaration under Section 564(b)(1) of the Act, 21 U.S.C.section 360bbb-3(b)(1), unless the authorization is terminated  or revoked sooner.       Influenza A by PCR NEGATIVE NEGATIVE Final   Influenza B by PCR NEGATIVE NEGATIVE Final    Comment: (NOTE) The Xpert Xpress SARS-CoV-2/FLU/RSV plus assay is intended as an aid in the diagnosis of influenza from Nasopharyngeal swab specimens and should not be used as a sole basis for treatment. Nasal washings and aspirates are unacceptable for Xpert Xpress SARS-CoV-2/FLU/RSV testing.  Fact Sheet for Patients: EntrepreneurPulse.com.au  Fact Sheet for Healthcare Providers: IncredibleEmployment.be  This test is not yet approved or cleared by the [place] FDA and has been authorized for detection and/or diagnosis of SARS-CoV-2 by FDA under an Emergency Use Authorization (EUA). This EUA will remain in effect (meaning this test can be used) for the duration of the COVID-19 declaration  under Section 564(b)(1) of the Act, 21 U.S.C. section 360bbb-3(b)(1), unless the authorization is terminated or revoked.  Performed at [facility], [address] [place], [place] [address]     Procedures and diagnostic studies:  No results found.   LOS: 28 days   [place] Copywriter, advertising on [email or web address]. If 7PM-7AM, please contact night-coverage at [email or web address]  This record has been created using Systems analyst. Errors have been sought and corrected,but may not always be located. Such creation errors do not reflect on the  standard of care.    07/18/2021, 7:49 AM

## 2021-07-19 NOTE — Progress Notes (Signed)
PT Cancellation Note  Patient Details Name: Sherley Leser MRN: [id number] DOB: June 29, 1961   Cancelled Treatment:    Reason Eval/Treat Not Completed: Patient declined, no reason specified. Patient declined due to wanting to sleep despite encouragement provided. He asked therapy to return at a later time. PT will continue with attempts.   Minna Merritts, PT, MPT  [name] 07/19/2021, 3:30 PM

## 2021-07-19 NOTE — Progress Notes (Signed)
OT Cancellation Note  Patient Details Name: Ryan [name] MRN: [id number] DOB: May 24, 1961   Cancelled Treatment:    Reason Eval/Treat Not Completed: Patient declined, no reason specified.  Per PT, pt refused therapy this PM, requesting return tomorrow. Will continue to follow POC at later date/time as pt available.    Nino Glow 07/19/2021, 3:33 PM

## 2021-07-19 NOTE — TOC Progression Note (Addendum)
Transition of Care [facility]) - Progression Note    Patient Details  Name: Ryan [name] MRN: [id number] Date of Birth: 04-14-1961  Transition of Care [facility]) CM/SW [place], RN Phone Number: 07/19/2021, 10:00 AM  Clinical Narrative:   Reached out to the Admissions director [place] [facility] and left a message requesting a call back inquiring if they would be able to accept the patient today  Update, Arrey with [facility] contacted me back and stated that she started working on his medication and will continue on Thursday when she is back in the office       Expected Discharge Plan and Services                                                 Social Determinants of Health (SDOH) Interventions    Readmission Risk Interventions Readmission Risk Prevention Plan 04/23/2021  Transportation Screening Complete  PCP or Specialist Appt within 3-5 Days Complete  HRI or Home Care Consult Complete  Social Work Consult for [place] Planning/Counseling Not Complete  SW consult not completed comments RNCM assigned to patient  Palliative Care Screening Complete  Medication Review Press photographer) Complete  Some recent data might be hidden

## 2021-07-19 NOTE — Progress Notes (Signed)
Progress Note    Ryan [name]  QIH:[id number] DOB: 12-06-1960  DOA: 06/02/2021 PCP: Pcp, No      Brief Narrative:    Medical records reviewed and are as summarized below:  Ryan [name] is a 60 y.o. 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 Patient was recently hospitalized from 6/21-8/24 due to metastasized prostate cancer. Pt was seen by [name] of oncology.  He was supposed to go to a homeless shelter but reportedly, he had issues when he was transported there.  He said he felt dizzy and fell accidentally causing severe pain in the left wrist and left ankle.  Work-up revealed fracture of the left distal radius and ulna and left distal tibial and fibular fractures.  He was treated with analgesics.  Orthopedic surgery recommended conservative management.  Splint has been removed.  Orthopedic is now recommending progressive weightbearing on upper and lower extremities.  Difficult disposition due to insurance.  Now had a bed offer which is not confirmed due to some LOC issues.  TOC is working on it  Patient with multiple life limiting comorbidities and advanced cancer.  High risk for deterioration and death.  Subjective. Patient has no new complaints.  Assessment/Plan:   Principal Problem:   Closed left ankle fracture Active Problems:   Fall   Anemia of chronic disease   Protein-calorie malnutrition, severe (HCC)   Cancer cachexia ([place])   Prostate cancer metastatic to bone (HCC)   Left wrist fracture, closed, initial encounter   Hypocalcemia   Hyperkalemia   Closed fracture of left distal radius   Closed bimalleolar fracture of left ankle   Nutrition Problem: Severe Malnutrition Etiology: chronic illness, cancer and cancer related treatments  Signs/Symptoms: severe fat depletion,  severe muscle depletion   Body mass index is 16.34 kg/m.   Closed left ankle fracture and left wrist fracture: Orthopedic surgery recommended conservative management.  Orthopedic surgery remove the splint yesterday and is advising progressive weightbearing on left upper and lower extremities.  Outpatient follow-up recommended in 4 to 6 weeks postdischarge.   Stage IV prostate cancer with metastasis to the bone: PSA level has dropped from > 3000 (2 months ago) to 433 (1 month ago).  Outpatient follow-up with oncologist.  Hypocalcemia: Continue calcium,  Hyponatremia: Asymptomatic  Chronic intermittent hypotension: Continue midodrine  Other comorbidities include anemia of chronic disease, anxiety, depression, cachexia, insomnia  Tuberculin skin test was done on 07/09/2021 and this was negative.  Chest x-ray was requested as well but patient had a chest x-ray in July 2022 which did not show any changes concerning for tuberculosis.  Additionally, he does not have any symptoms to suggest tuberculosis.  There is no need to repeat chest x-ray at this time. Screening COVID test was negative. Awaiting placement to SNF-had 1 bed offer but there were some issues with LOC approval.  TOC is working on it  Diet Order             Diet regular Room service appropriate? Yes; Fluid consistency: Thin  Diet effective now                   Consultants: Oncologist Orthopedic surgeon  Procedures: None   Medications:    calcium carbonate  1,250 mg Oral TID WC   feeding supplement  237 mL Oral QID  magnesium oxide  400 mg Oral Daily   midodrine  5 mg Oral TID WC   mirtazapine  15 mg Oral QHS   multivitamin with minerals  1 tablet Oral Daily   nicotine  21 mg Transdermal Daily   rivaroxaban  10 mg Oral Q supper   senna-docusate  1 tablet Oral BID   Continuous Infusions:  sodium chloride Stopped (06/04/21 1216)     Anti-infectives (From admission, onward)    None       Family  Communication/Anticipated D/C date and plan/Code Status   DVT prophylaxis: rivaroxaban (XARELTO) tablet 10 mg Start: 06/29/21 2215 rivaroxaban (XARELTO) tablet 10 mg     Code Status: DNR  Family Communication: None Disposition Plan:    Status is: Inpatient  Remains inpatient appropriate because:Unsafe d/c plan  Dispo: The patient is from: Home              Anticipated d/c is to: SNF              Patient currently is medically stable to d/c.   Difficult to place patient Yes   Objective:    Vitals:   07/18/21 1709 07/18/21 1953 07/19/21 0409 07/19/21 0734  BP: (!) 95/52 109/60 (!) 104/52 (!) 97/53  Pulse: 92 87 93 94  Resp: 14 17 16 18   Temp: 98.2 F (36.8 C) 98.5 F (36.9 C) 98.2 F (36.8 C) 98.5 F (36.9 C)  TempSrc:      SpO2: 98% 100% 97% 96%  Weight:      Height:       No data found.   Intake/Output Summary (Last 24 hours) at 07/19/2021 1509 Last data filed at 07/18/2021 1711 Gross per 24 hour  Intake --  Output 100 ml  Net -100 ml    Filed Weights   06/23/21 1545 07/06/21 1406 07/07/21 1443  Weight: 62.6 kg 65.6 kg 62.5 kg    Exam:  General.  Emaciated gentleman, in no acute distress. Pulmonary.  Lungs clear bilaterally, normal respiratory effort. CV.  Regular rate and rhythm, no JVD, rub or murmur. Abdomen.  Soft, nontender, nondistended, BS positive. CNS.  Alert and oriented x3.  No focal neurologic deficit. Extremities.  No edema, no cyanosis, pulses intact and symmetrical. Psychiatry.  Judgment and insight appears normal.   Data Reviewed:   I have personally reviewed following labs and imaging studies:  Labs: Labs show the following:   Basic Metabolic Panel: Recent Labs  Lab 07/14/21 0327  NA 134*  K 4.5  CL 106  CO2 22  GLUCOSE 104*  BUN 11  CREATININE 0.67  CALCIUM 8.4*    GFR Estimated Creatinine Clearance: 87.9 mL/min (by C-G formula based on SCr of 0.67 mg/dL). Liver Function Tests: No results for input(s): AST, ALT,  ALKPHOS, BILITOT, PROT, ALBUMIN in the last 168 hours. No results for input(s): LIPASE, AMYLASE in the last 168 hours. No results for input(s): AMMONIA in the last 168 hours. Coagulation profile No results for input(s): INR, PROTIME in the last 168 hours.  CBC: Recent Labs  Lab 07/14/21 0327  WBC 5.8  HGB 9.1*  HCT 29.5*  MCV 84.8  PLT 218    Cardiac Enzymes: No results for input(s): CKTOTAL, CKMB, CKMBINDEX, TROPONINI in the last 168 hours. BNP (last 3 results) No results for input(s): PROBNP in the last 8760 hours. CBG: No results for input(s): GLUCAP in the last 168 hours.  D-Dimer: No results for input(s): DDIMER in the last 72 hours. Hgb  A1c: No results for input(s): HGBA1C in the last 72 hours. Lipid Profile: No results for input(s): CHOL, HDL, LDLCALC, TRIG, CHOLHDL, LDLDIRECT in the last 72 hours. Thyroid function studies: No results for input(s): TSH, T4TOTAL, T3FREE, THYROIDAB in the last 72 hours.  Invalid input(s): FREET3 Anemia work up: No results for input(s): VITAMINB12, FOLATE, FERRITIN, TIBC, IRON, RETICCTPCT in the last 72 hours. Sepsis Labs: Recent Labs  Lab 07/14/21 0327  WBC 5.8     Microbiology Recent Results (from the past 240 hour(s))  Resp Panel by RT-PCR (Flu A&B, Covid) Nasopharyngeal Swab     Status: None   Collection Time: 07/12/21  2:07 PM   Specimen: Nasopharyngeal Swab; Nasopharyngeal(NP) swabs in vial transport medium  Result Value Ref Range Status   SARS Coronavirus 2 by RT PCR NEGATIVE NEGATIVE Final    Comment: (NOTE) SARS-CoV-2 target nucleic acids are NOT DETECTED.  The SARS-CoV-2 RNA is generally detectable in upper respiratory specimens during the acute phase of infection. The lowest concentration of SARS-CoV-2 viral copies this assay can detect is 138 copies/mL. A negative result does not preclude SARS-Cov-2 infection and should not be used as the sole basis for treatment or other patient management decisions. A  negative result may occur with  improper specimen collection/handling, submission of specimen other than nasopharyngeal swab, presence of viral mutation(s) within the areas targeted by this assay, and inadequate number of viral copies(<138 copies/mL). A negative result must be combined with clinical observations, patient history, and epidemiological information. The expected result is Negative.  Fact Sheet for Patients:  EntrepreneurPulse.com.au  Fact Sheet for Healthcare Providers:  IncredibleEmployment.be  This test is no t yet approved or cleared by the [place] FDA and  has been authorized for detection and/or diagnosis of SARS-CoV-2 by FDA under an Emergency Use Authorization (EUA). This EUA will remain  in effect (meaning this test can be used) for the duration of the COVID-19 declaration under Section 564(b)(1) of the Act, 21 U.S.C.section 360bbb-3(b)(1), unless the authorization is terminated  or revoked sooner.       Influenza A by PCR NEGATIVE NEGATIVE Final   Influenza B by PCR NEGATIVE NEGATIVE Final    Comment: (NOTE) The Xpert Xpress SARS-CoV-2/FLU/RSV plus assay is intended as an aid in the diagnosis of influenza from Nasopharyngeal swab specimens and should not be used as a sole basis for treatment. Nasal washings and aspirates are unacceptable for Xpert Xpress SARS-CoV-2/FLU/RSV testing.  Fact Sheet for Patients: EntrepreneurPulse.com.au  Fact Sheet for Healthcare Providers: IncredibleEmployment.be  This test is not yet approved or cleared by the [place] FDA and has been authorized for detection and/or diagnosis of SARS-CoV-2 by FDA under an Emergency Use Authorization (EUA). This EUA will remain in effect (meaning this test can be used) for the duration of the COVID-19 declaration under Section 564(b)(1) of the Act, 21 U.S.C. section 360bbb-3(b)(1), unless the authorization  is terminated or revoked.  Performed at [facility], [address] [place], [place] [address]     Procedures and diagnostic studies:  No results found.   LOS: 50 days   [employer] Copywriter, advertising on [email or web address]. If 7PM-7AM, please contact night-coverage at [email or web address]  This record has been created using Systems analyst. Errors have been sought and corrected,but may not always be located. Such creation errors do not reflect on the standard of care.    07/19/2021, 3:09 PM

## 2021-07-20 NOTE — Progress Notes (Signed)
OT Cancellation Note  Patient Details Name: Ryan [name] MRN: [id number] DOB: September 06, 1961   Cancelled Treatment:    Reason Eval/Treat Not Completed: Patient at procedure or test/ unavailable. 1st attempt, pt with visitors. 2nd attempt, pt working with PT. Will re-attempt at later date/time as pt is available.   Ardeth Perfect., MPH, MS, OTR/L ascom [phone] 07/20/21, 4:03 PM

## 2021-07-20 NOTE — Evaluation (Signed)
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  Pt recently here for multiple months with difficult d/c planning, did ultimatley d/c to homeless shelter and was gone only a few hours before fall with L ankle and wrist fractures with return to the ED.  Clinical Impression  Patient with prolonged hospital stay. Re-evaluation completed, plan of care updated, and goals updated accordingly. Patient has been upgraded to [facility] with splint removed as per ortho recommendation. Patient is deconditioned and requires maximal assistance for sit to stand transfers. Limited standing tolerance and generalized weakness throughout. Patient is making slow progress overall with functional independence, has been limited up until recently by weight bearing restrictions. Recommend to continue PT to maximize independence and facilitate return to prior level of function. SNF is recommended at discharge.      Recommendations for follow up therapy are one component of a multi-disciplinary discharge planning process, led by the attending physician.  Recommendations may be updated based on patient status, additional functional criteria and insurance authorization.  Follow Up Recommendations SNF;Supervision/Assistance - 24 hour    Equipment Recommendations  Rolling walker with 5" wheels;3in1 (PT)    Recommendations for Other Services       Precautions / Restrictions Precautions Precautions: Fall Restrictions Weight Bearing Restrictions: Yes RUE Weight Bearing: Weight bearing as tolerated LUE Weight Bearing: Weight bearing as tolerated RLE Weight Bearing: Weight bearing as tolerated LLE Weight  Bearing: Weight bearing as tolerated Other Position/Activity Restrictions: Per Dr. Serita Grit note on 10/5: "Can progress to WBAT on both RLE and RUE over next few days." splints removed 10/5      Mobility  Bed Mobility Overal bed mobility: Needs Assistance Bed Mobility: Supine to Sit;Sit to Supine     Supine to sit: Supervision Sit to supine: Supervision        Transfers Overall transfer level: Needs assistance Equipment used: Rolling walker (2 wheeled) Transfers: Sit to/from Stand Sit to Stand: Max assist;From elevated surface         General transfer comment: significant lifting assistance provided to stand. verbal cues for technique and safety  Ambulation/Gait             General Gait Details: unable to attempt due to fatigue and pain in left wrist with activity. limited endurance and standing tolerance overall  Stairs            Wheelchair Mobility    Modified Rankin (Stroke Patients Only)       Balance Overall balance assessment: Needs assistance Sitting-balance support: No upper extremity supported Sitting balance-Leahy Scale: Good Sitting balance - Comments: no loss of balance in sitting position   Standing balance support: Bilateral upper extremity supported Standing balance-Leahy Scale: Poor Standing balance comment: poor progressing to fair with increased standing time with UE supported on rolling walker. Min A- Min guard assistance provided. standing tolerance less than 2 minutes                             Pertinent Vitals/Pain Pain Assessment: Faces Faces Pain Scale: Hurts little more Pain Location: left wrist with AROM/weight bearing Pain Descriptors / Indicators: Sore Pain Intervention(s): Monitored during session (offered  an ice pack and patient declined)    Home Living                        Prior Function Level of Independence: Needs assistance   Gait / Transfers Assistance Needed: uses RW for ambulating  limited distances  ADL's / Homemaking Assistance Needed: Pt has needed assistance with toileting, grooming, dressing, fxl mobility throught recent long hospitalization        Hand Dominance        Extremity/Trunk Assessment   Upper Extremity Assessment Upper Extremity Assessment: Generalized weakness LUE Deficits / Details: splint has been removed LUE. stiffness and pain with AROM of hand and wrist. unable to fully close or open hand without patient providing AAROM    Lower Extremity Assessment Lower Extremity Assessment: Generalized weakness LLE Deficits / Details: AROM ankle WFL. generalized weakness throughout       Communication      Cognition Arousal/Alertness: Awake/alert Behavior During Therapy: WFL for tasks assessed/performed Overall Cognitive Status: Within Functional Limits for tasks assessed                                 General Comments: patient is able to follow commands without difficulty      General Comments      Exercises     Assessment/Plan    PT Assessment Patient needs continued PT services  PT Problem List Decreased strength;Decreased range of motion;Decreased activity tolerance;Decreased balance;Decreased mobility;Pain;Decreased safety awareness;Decreased knowledge of use of DME       PT Treatment Interventions DME instruction;Gait training;Stair training;Therapeutic activities;Functional mobility training;Therapeutic exercise;Balance training;Neuromuscular re-education;Patient/family education    PT Goals (Current goals can be found in the Care Plan section)  Acute Rehab PT Goals Patient Stated Goal: to be more independent PT Goal Formulation: With patient Time For Goal Achievement: 08/03/21 Potential to Achieve Goals: Fair    Frequency Min 2X/week   Barriers to discharge Decreased caregiver support      Co-evaluation               AM-PAC PT "6 Clicks" Mobility  Outcome Measure Help needed turning from  your back to your side while in a flat bed without using bedrails?: None Help needed moving from lying on your back to sitting on the side of a flat bed without using bedrails?: A Little Help needed moving to and from a bed to a chair (including a wheelchair)?: A Lot Help needed standing up from a chair using your arms (e.g., wheelchair or bedside chair)?: A Lot Help needed to walk in hospital room?: A Lot Help needed climbing 3-5 steps with a railing? : Total 6 Click Score: 14    End of Session   Activity Tolerance: Patient limited by fatigue Patient left: in bed;with call bell/phone within reach;with bed alarm set   PT Visit Diagnosis: Unsteadiness on feet (R26.81);Muscle weakness (generalized) (M62.81);Difficulty in walking, not elsewhere classified (R26.2);History of falling (Z91.81) Pain - Right/Left: Left Pain - part of body: Hand    Time: 2902-1115 PT Time Calculation (min) (ACUTE ONLY): 44 min   Charges:   PT Evaluation $PT Re-evaluation: 1 Re-eval PT Treatments $Therapeutic Activity: 23-37 mins        Minna Merritts, PT, MPT   [name] 07/20/2021, 3:19 PM

## 2021-07-20 NOTE — Progress Notes (Signed)
Progress Note    Ryan [name]  GDJ:[id number] DOB: 05/26/1961  DOA: 06/02/2021 PCP: Pcp, No      Brief Narrative:    Medical records reviewed and are as summarized below:  Ryan [name] is a 60 y.o. 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 Patient was recently hospitalized from 6/21-8/24 due to metastasized prostate cancer. Pt was seen by [name] of oncology.  He was supposed to go to a homeless shelter but reportedly, he had issues when he was transported there.  He said he felt dizzy and fell accidentally causing severe pain in the left wrist and left ankle.  Work-up revealed fracture of the left distal radius and ulna and left distal tibial and fibular fractures.  He was treated with analgesics.  Orthopedic surgery recommended conservative management.  Splint has been removed.  Orthopedic is now recommending progressive weightbearing on upper and lower extremities.  Difficult disposition due to insurance.  Now had a bed offer which is not confirmed due to some LOC issues.  TOC is working on it  Patient with multiple life limiting comorbidities and advanced cancer.  High risk for deterioration and death.  Subjective. Patient was seen and examined today.  No new complaints.  Watching TV comfortably.  Able to walk with the help of PT although experiencing pain with ambulation.  Assessment/Plan:   Principal Problem:   Closed left ankle fracture Active Problems:   Fall   Anemia of chronic disease   Protein-calorie malnutrition, severe (HCC)   Cancer cachexia ([place])   Prostate cancer metastatic to bone (HCC)   Left wrist fracture, closed, initial encounter   Hypocalcemia   Hyperkalemia   Closed fracture of left distal radius   Closed bimalleolar fracture of left ankle   Nutrition  Problem: Severe Malnutrition Etiology: chronic illness, cancer and cancer related treatments  Signs/Symptoms: severe fat depletion, severe muscle depletion   Body mass index is 16.34 kg/m.   Closed left ankle fracture and left wrist fracture: Orthopedic surgery recommended conservative management.  Orthopedic surgery remove the splint yesterday and is advising progressive weightbearing on left upper and lower extremities.  Outpatient follow-up recommended in 4 to 6 weeks postdischarge.   Stage IV prostate cancer with metastasis to the bone: PSA level has dropped from > 3000 (2 months ago) to 433 (1 month ago).  Outpatient follow-up with oncologist.  Hypocalcemia: Continue calcium,  Hyponatremia: Asymptomatic  Chronic intermittent hypotension: Continue midodrine  Other comorbidities include anemia of chronic disease, anxiety, depression, cachexia, insomnia  Tuberculin skin test was done on 07/09/2021 and this was negative.  Chest x-ray was requested as well but patient had a chest x-ray in July 2022 which did not show any changes concerning for tuberculosis.  Additionally, he does not have any symptoms to suggest tuberculosis.  There is no need to repeat chest x-ray at this time. Screening COVID test was negative. Awaiting placement to SNF-had 1 bed offer but there were some issues with LOC approval.  TOC is working on it  Diet Order             Diet regular Room service appropriate? Yes; Fluid consistency: Thin  Diet effective now                   Consultants: Oncologist Orthopedic surgeon  Procedures: None  Medications:    calcium carbonate  1,250 mg Oral TID WC   feeding supplement  237 mL Oral QID   magnesium oxide  400 mg Oral Daily   midodrine  5 mg Oral TID WC   mirtazapine  15 mg Oral QHS   multivitamin with minerals  1 tablet Oral Daily   nicotine  21 mg Transdermal Daily   rivaroxaban  10 mg Oral Q supper   senna-docusate  1 tablet Oral BID    Continuous Infusions:  sodium chloride Stopped (06/04/21 1216)     Anti-infectives (From admission, onward)    None       Family Communication/Anticipated D/C date and plan/Code Status   DVT prophylaxis: rivaroxaban (XARELTO) tablet 10 mg Start: 06/29/21 2215 rivaroxaban (XARELTO) tablet 10 mg     Code Status: DNR  Family Communication: None Disposition Plan:    Status is: Inpatient  Remains inpatient appropriate because:Unsafe d/c plan  Dispo: The patient is from: Home              Anticipated d/c is to: SNF              Patient currently is medically stable to d/c.   Difficult to place patient Yes   Objective:    Vitals:   07/19/21 2001 07/20/21 0512 07/20/21 0722 07/20/21 1126  BP: (!) 107/55 (!) 107/57 (!) 102/52 108/61  Pulse: 91 94 100 90  Resp: 18 16 18 18   Temp: 98.7 F (37.1 C) 97.6 F (36.4 C) 98.3 F (36.8 C) 98.1 F (36.7 C)  TempSrc:   Oral   SpO2: 97% 99% 94% 98%  Weight:      Height:       No data found.   Intake/Output Summary (Last 24 hours) at 07/20/2021 1653 Last data filed at 07/20/2021 1332 Gross per 24 hour  Intake --  Output 375 ml  Net -375 ml    Filed Weights   06/23/21 1545 07/06/21 1406 07/07/21 1443  Weight: 62.6 kg 65.6 kg 62.5 kg    Exam:  General.  Malnourished gentleman, in no acute distress. Pulmonary.  Lungs clear bilaterally, normal respiratory effort. CV.  Regular rate and rhythm, no JVD, rub or murmur. Abdomen.  Soft, nontender, nondistended, BS positive. CNS.  Alert and oriented .  No focal neurologic deficit. Extremities.  No edema, no cyanosis, pulses intact and symmetrical. Psychiatry.  Judgment and insight appears normal.   Data Reviewed:   I have personally reviewed following labs and imaging studies:  Labs: Labs show the following:   Basic Metabolic Panel: Recent Labs  Lab 07/14/21 0327  NA 134*  K 4.5  CL 106  CO2 22  GLUCOSE 104*  BUN 11  CREATININE 0.67  CALCIUM 8.4*     GFR Estimated Creatinine Clearance: 87.9 mL/min (by C-G formula based on SCr of 0.67 mg/dL). Liver Function Tests: No results for input(s): AST, ALT, ALKPHOS, BILITOT, PROT, ALBUMIN in the last 168 hours. No results for input(s): LIPASE, AMYLASE in the last 168 hours. No results for input(s): AMMONIA in the last 168 hours. Coagulation profile No results for input(s): INR, PROTIME in the last 168 hours.  CBC: Recent Labs  Lab 07/14/21 0327  WBC 5.8  HGB 9.1*  HCT 29.5*  MCV 84.8  PLT 218    Cardiac Enzymes: No results for input(s): CKTOTAL, CKMB, CKMBINDEX, TROPONINI in the last 168 hours. BNP (last 3 results) No results for input(s): PROBNP in the last 8760 hours. CBG:  No results for input(s): GLUCAP in the last 168 hours.  D-Dimer: No results for input(s): DDIMER in the last 72 hours. Hgb A1c: No results for input(s): HGBA1C in the last 72 hours. Lipid Profile: No results for input(s): CHOL, HDL, LDLCALC, TRIG, CHOLHDL, LDLDIRECT in the last 72 hours. Thyroid function studies: No results for input(s): TSH, T4TOTAL, T3FREE, THYROIDAB in the last 72 hours.  Invalid input(s): FREET3 Anemia work up: No results for input(s): VITAMINB12, FOLATE, FERRITIN, TIBC, IRON, RETICCTPCT in the last 72 hours. Sepsis Labs: Recent Labs  Lab 07/14/21 0327  WBC 5.8     Microbiology Recent Results (from the past 240 hour(s))  Resp Panel by RT-PCR (Flu A&B, Covid) Nasopharyngeal Swab     Status: None   Collection Time: 07/12/21  2:07 PM   Specimen: Nasopharyngeal Swab; Nasopharyngeal(NP) swabs in vial transport medium  Result Value Ref Range Status   SARS Coronavirus 2 by RT PCR NEGATIVE NEGATIVE Final    Comment: (NOTE) SARS-CoV-2 target nucleic acids are NOT DETECTED.  The SARS-CoV-2 RNA is generally detectable in upper respiratory specimens during the acute phase of infection. The lowest concentration of SARS-CoV-2 viral copies this assay can detect is 138 copies/mL. A  negative result does not preclude SARS-Cov-2 infection and should not be used as the sole basis for treatment or other patient management decisions. A negative result may occur with  improper specimen collection/handling, submission of specimen other than nasopharyngeal swab, presence of viral mutation(s) within the areas targeted by this assay, and inadequate number of viral copies(<138 copies/mL). A negative result must be combined with clinical observations, patient history, and epidemiological information. The expected result is Negative.  Fact Sheet for Patients:  EntrepreneurPulse.com.au  Fact Sheet for Healthcare Providers:  IncredibleEmployment.be  This test is no t yet approved or cleared by the [place] FDA and  has been authorized for detection and/or diagnosis of SARS-CoV-2 by FDA under an Emergency Use Authorization (EUA). This EUA will remain  in effect (meaning this test can be used) for the duration of the COVID-19 declaration under Section 564(b)(1) of the Act, 21 U.S.C.section 360bbb-3(b)(1), unless the authorization is terminated  or revoked sooner.       Influenza A by PCR NEGATIVE NEGATIVE Final   Influenza B by PCR NEGATIVE NEGATIVE Final    Comment: (NOTE) The Xpert Xpress SARS-CoV-2/FLU/RSV plus assay is intended as an aid in the diagnosis of influenza from Nasopharyngeal swab specimens and should not be used as a sole basis for treatment. Nasal washings and aspirates are unacceptable for Xpert Xpress SARS-CoV-2/FLU/RSV testing.  Fact Sheet for Patients: EntrepreneurPulse.com.au  Fact Sheet for Healthcare Providers: IncredibleEmployment.be  This test is not yet approved or cleared by the [place] FDA and has been authorized for detection and/or diagnosis of SARS-CoV-2 by FDA under an Emergency Use Authorization (EUA). This EUA will remain in effect (meaning this test can  be used) for the duration of the COVID-19 declaration under Section 564(b)(1) of the Act, 21 U.S.C. section 360bbb-3(b)(1), unless the authorization is terminated or revoked.  Performed at [facility], [address] [place], [place] [address]     Procedures and diagnostic studies:  No results found.   LOS: 19 days   [employer], advertising on [email or web address]. If 7PM-7AM, please contact night-coverage at [email or web address]  This record has been created using Systems analyst. Errors have been sought and corrected,but may not always be located. Such creation errors do not  reflect on the standard of care.    07/20/2021, 4:53 PM

## 2021-07-20 NOTE — Progress Notes (Signed)
Nutrition Follow-up  DOCUMENTATION CODES:  Severe malnutrition in context of chronic illness, Underweight  INTERVENTION:  Continue current diet as ordered Continue Ensure Enlive po QID, each supplement provides 350 kcal and 20 grams of protein Continue MVI with minerals daily Continue snacks TID Daily weights  NUTRITION DIAGNOSIS:  Severe Malnutrition related to chronic illness, cancer and cancer related treatments as evidenced by severe fat depletion, severe muscle depletion.  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 Recently admitted 6/21-8/24. Discharged to homeless shelter then returned to ED a few hours later after a fall and pain to his wrist.  Stable for dc, case management team working on placement. Limited meal intake to review. Noted that pt has been refusing several doses of his ensure over the last few days. No new weight in >7 days, will request daily weights and weekly weights are often skipped.  Average Meal Intake: 8/27-8/29: 72% intake x 2 recorded meals (50-94%) 8/30-9/7: 63% intake x 2 recorded meals (25-100%) 9/8-9/14: 50% intake x 3 recorded meals (50%) 9/15-9/20: 35% intake x 2 recorded meals (20-50%) 9/21-9/27: 50% intake x 3 recorded meals (0-100%) 9/28-10/4: limited meals recorded 10/5-10/10: 100% intake x 1 recorded meals  Nutritionally Relevant Medications: Scheduled Meds:  calcium carbonate  1,250 mg Oral TID WC   feeding supplement  237 mL Oral QID   magnesium oxide  400 mg Oral Daily   mirtazapine  15 mg Oral QHS   multivitamin with minerals  1 tablet Oral Daily   senna-docusate  1 tablet Oral BID   PRN Meds: ondansetron  Labs Reviewed  NUTRITION - FOCUSED  PHYSICAL EXAM: Flowsheet Row Most Recent Value  Orbital Region Mild depletion  Upper Arm Region Severe depletion  Thoracic and Lumbar Region Severe depletion  Buccal Region Mild depletion  Temple Region Moderate depletion  Clavicle Bone Region Severe depletion  Clavicle and Acromion Bone Region Severe depletion  Scapular Bone Region Severe depletion  Dorsal Hand Severe depletion  Patellar Region Severe depletion  Anterior Thigh Region Severe depletion  Posterior Calf Region Severe depletion  Edema (RD Assessment) None  Hair Reviewed  Eyes Reviewed  Mouth Reviewed  Skin Reviewed  Nails Reviewed    Diet Order:   Diet Order             Diet regular Room service appropriate? Yes; Fluid consistency: Thin  Diet effective now                  EDUCATION NEEDS:  Education needs have been addressed  Skin:  Skin Assessment: Reviewed RN Assessment  Last BM:  10/8  Height:  Ht Readings from Last 1 Encounters:  07/06/21 6\' 5"  (1.956 m)   Weight:  Wt Readings from Last 1 Encounters:  07/07/21 62.5 kg   Ideal Body Weight:  94.5 kg  BMI:  Body mass index is 16.34 kg/m.  Estimated Nutritional Needs:  Kcal:  2400-2600 Protein:  130-150 grams Fluid:  >2.4 L  Ranell Patrick, RD, LDN Clinical Dietitian Pager on [place]

## 2021-07-21 NOTE — Progress Notes (Signed)
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  Pt recently here for multiple months with difficult d/c planning, did ultimatley d/c to homeless shelter and was gone only a few hours before fall with L ankle and wrist fractures with return to the ED.    PT Comments    PT/OT co treat 2/2 to pt fatigue and requiring +2 assistance for ambulation (chair follow). He is alert and cooperative throughout session with constant encouragement. Was abe to exit L side of bed with supervision. Stood to RW ROM slightly elevated bed height with min assist. Required mod assist to stand from lower recliner surface height. He ambulated 2 x 30 ft with chair follow. Did better with use of platform on LUE due to wrist pain with wt bearing. OT/author reviewed importance of there ex on LUE/LLE to promote return in ROM and strength. Overall pt tolerated session well and is progressing with all mobility, transfers, and gait. Recommend 24/7 assistance for safety at DC. HHPT to continue to progress ROM, strength, and safety with ADLs.    Recommendations for follow up therapy are one component of a multi-disciplinary discharge planning process, led by the attending physician.  Recommendations may be updated based on patient status, additional functional criteria and insurance authorization.  Follow Up Recommendations  Supervision/Assistance - 24 hour;Home health PT     Equipment Recommendations  Rolling walker with 5" wheels;3in1 (PT) (with platform on LUE)       Precautions / Restrictions Precautions Precautions: Fall Precaution Comments: WBAT Restrictions Weight Bearing Restrictions: Yes LUE  Weight Bearing: Weight bearing as tolerated LLE Weight Bearing: Weight bearing as tolerated     Mobility  Bed Mobility Overal bed mobility: Needs Assistance Bed Mobility: Supine to Sit     Supine to sit: Supervision     General bed mobility comments: no physical assistance to perform supine to sit with bed flat    Transfers Overall transfer level: Needs assistance Equipment used: Rolling walker (2 wheeled);Left platform walker Transfers: Sit to/from Stand Sit to Stand: Min assist;Mod assist (mod from lower recliner height but min from elevated EOB surface)         General transfer comment: pt was able to STS 2 x during session with assistance.  Ambulation/Gait Ambulation/Gait assistance: Min assist Gait Distance (Feet): 30 Feet Assistive device: Rolling walker (2 wheeled) Gait Pattern/deviations: Step-to pattern;Antalgic Gait velocity: decreased   General Gait Details: 2 x 30 ft with RW. First trial without LUE platform. 2nd trial much improved posture and kinematics with LUE platform     Balance Overall balance assessment: Needs assistance Sitting-balance support: No upper extremity supported Sitting balance-Leahy Scale: Good     Standing balance support: Bilateral upper extremity supported Standing balance-Leahy Scale: Fair           Cognition Arousal/Alertness: Awake/alert Behavior During Therapy: WFL for tasks assessed/performed Overall Cognitive Status: Within Functional Limits for tasks assessed        General Comments: Pt is A and agreeable to session with encouragement         General Comments General comments (skin integrity, edema, etc.): pt was reeducated on importance of ROM and strengthening  in LUE/LLE to promote return in  safety and abilities.      Pertinent Vitals/Pain Pain Assessment: 0-10 Pain Score: 4  Faces Pain Scale: Hurts a little bit Pain Location: left wrist with AROM/weight bearing Pain Descriptors / Indicators: Sore Pain  Intervention(s): Limited activity within patient's tolerance;Premedicated before session;Monitored during session;Repositioned     PT Goals (current goals can now be found in the care plan section) Acute Rehab PT Goals Patient Stated Goal: to be more independent Progress towards PT goals: Progressing toward goals    Frequency    Min 2X/week      PT Plan Current plan remains appropriate    Co-evaluation     PT goals addressed during session: Mobility/safety with mobility;Balance;Proper use of DME;Strengthening/ROM        AM-PAC PT "6 Clicks" Mobility   Outcome Measure  Help needed turning from your back to your side while in a flat bed without using bedrails?: None Help needed moving from lying on your back to sitting on the side of a flat bed without using bedrails?: A Little Help needed moving to and from a bed to a chair (including a wheelchair)?: A Little Help needed standing up from a chair using your arms (e.g., wheelchair or bedside chair)?: A Little Help needed to walk in hospital room?: A Little Help needed climbing 3-5 steps with a railing? : A Lot 6 Click Score: 18    End of Session Equipment Utilized During Treatment: Gait belt Activity Tolerance: Patient tolerated treatment well;Patient limited by fatigue Patient left: in chair;with call bell/phone within reach;with chair alarm set Nurse Communication: Mobility status PT Visit Diagnosis: Unsteadiness on feet (R26.81);Muscle weakness (generalized) (M62.81);Difficulty in walking, not elsewhere classified (R26.2);History of falling (Z91.81) Pain - Right/Left: Left Pain - part of body: Hand     Time: 1400-1426 PT Time Calculation (min) (ACUTE ONLY): 26 min  Charges:  $Gait Training: 8-22 mins $Therapeutic Activity: 8-22 mins                    Julaine Fusi PTA 07/21/21, 4:04 PM

## 2021-07-21 NOTE — Assessment & Plan Note (Signed)
--   Follow-up with [name] as an outpatient

## 2021-07-21 NOTE — Assessment & Plan Note (Signed)
--   Encourage proper intake

## 2021-07-21 NOTE — Progress Notes (Signed)
  Progress Note    Ryan [name]   LJQ:[id number]  DOB: 07-09-1961  DOA: 06/02/2021     48 Date of Service: 07/21/2021   60 year old man PMH metastatic prostate cancer presented with a fall resulting in closed left ankle fracture and left wrist fracture.  This was treated with conservative management.  Discharge delayed by difficulty with disposition.  Subjective:  feels good, has been eating ok, breathing ok, working with therapy  Hospital Problems * Closed left ankle fracture -- Treated by orthopedics with conservative management, now weightbearing  Left wrist fracture, closed, initial encounter -- Treated with conservative management  Prostate cancer metastatic to bone [facility]) -- Follow-up with [name] as an outpatient  Cancer cachexia [facility]) -- Encourage proper intake  Protein-calorie malnutrition, severe ([place]) -- Encourage proper intake  Anemia of chronic disease -- Stable  Objective Vital signs were reviewed and unremarkable.  Vitals:   07/21/21 0404 07/21/21 0748 07/21/21 1122 07/21/21 1554  BP: (!) 93/51 (!) 100/50 (!) 104/51 (!) 102/58  Pulse: 94 100 (!) 102 (!) 104  Resp: 16   16  Temp: 98.4 F (36.9 C) 99.8 F (37.7 C) 98.9 F (37.2 C) 97.9 F (36.6 C)  TempSrc: Oral Oral Oral   SpO2: 91% 93% 94% 93%  Weight:      Height:       62.5 kg  Exam Physical Exam Constitutional:      General: He is not in acute distress. Cardiovascular:     Rate and Rhythm: Normal rate and regular rhythm.     Heart sounds: No murmur heard. Pulmonary:     Effort: Pulmonary effort is normal. No respiratory distress.     Breath sounds: No wheezing or rales.  Musculoskeletal:     Right lower leg: No edema.     Left lower leg: No edema.  Neurological:     Mental Status: He is alert.  Psychiatric:        Mood and Affect: Mood normal.        Behavior: Behavior normal.     Labs / Other Information My review of labs, imaging, notes and other tests is significant for  stable BMP and Hgb 9.1     Time spent: 25 minutes [facility] 07/21/2021, 6:38 PM

## 2021-07-21 NOTE — Progress Notes (Signed)
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  Pt recently here for multiple months with difficult d/c planning, did ultimatley d/c to homeless shelter and was gone only a few hours before fall with L ankle and wrist fractures with return to the ED.   OT comments  Pt seen for OT tx this date to f/u re: safety with ADLs/ADL mobility. Pt requires SETUP/SUPV to complete LB dressing and peri care in bed with cues for thorough completion. Pt requires MIN A to CTS and for balance with platform walker for fxl mobility out to hallway. OT follows with chair while cueing for posture as pt consistently resorts to hip flexion in standing when fatiguing. PT assisting for balance with mobility at this time. Requires gentle encouragement for motivation throughout as well as grading of tasks d/t tolerance (ex: replaced platform on walker d/t decreased tolerance for L UE WB). In addition, OT Engaged pt in gentle L wrist stretch/AROM to encourage optimal healing. Pt left in chair at end of session with all needs met and in reach. Will continue to follow.    Recommendations for follow up therapy are one component of a multi-disciplinary discharge planning process, led by the attending physician.  Recommendations may be updated based on patient status, additional functional criteria and insurance authorization.    Follow Up Recommendations  SNF    Equipment Recommendations  None recommended by OT    Recommendations for Other Services      Precautions / Restrictions Precautions Precautions: Fall Precaution Comments: WBAT Other Brace: pt no longer using LLE post-op  shoe Restrictions Weight Bearing Restrictions: Yes LUE Weight Bearing: Weight bearing as tolerated LLE Weight Bearing: Weight bearing as tolerated Other Position/Activity Restrictions: splints removed 10/5, can progress to WBAT       Mobility Bed Mobility Overal bed mobility: Needs Assistance Bed Mobility: Supine to Sit     Supine to sit: Supervision;Modified independent (Device/Increase time)     General bed mobility comments: no physical assistance to perform supine to sit with bed flat    Transfers Overall transfer level: Needs assistance Equipment used: Rolling walker (2 wheeled);Left platform walker Transfers: Sit to/from Stand Sit to Stand: Min assist;Mod assist         General transfer comment: STS x2 trials with cues for safe sequencing of walker use. Trialed w/o platform initially to see if pt could tolerate, but pt with some decreased L wrist ROM making it difficult to extend wrist for grasping handle of walker    Balance Overall balance assessment: Needs assistance Sitting-balance support: No upper extremity supported Sitting balance-Leahy Scale: Good     Standing balance support: Bilateral upper extremity supported Standing balance-Leahy Scale: Fair Standing balance comment: UE support on RW, platform added back d/t limited tolerance from pt to WB on L wrist.                           ADL either performed or assessed with clinical judgement   ADL                       Lower Body Dressing: Bed level;Set up;Supervision/safety Lower Body  Dressing Details (indicate cue type and reason): to doff soiled brief and don clean one     Toileting- Clothing Manipulation and Hygiene: Set up;Bed level Toileting - Clothing Manipulation Details (indicate cue type and reason): using lateral rolling technique to complete posterior LB peri care in bed     Functional mobility during ADLs: Rolling walker;Minimal assistance (chair follow for fatigue, OT  cueing for posture, PTA supporting for balance.)       Vision       Perception     Praxis      Cognition Arousal/Alertness: Awake/alert Behavior During Therapy: WFL for tasks assessed/performed Overall Cognitive Status: Within Functional Limits for tasks assessed                                 General Comments: Pt is A and agreeable to session with encouragement        Exercises Other Exercises Other Exercises: OT engages pt in LB dressing and wrist extension/flexion exercises.   Shoulder Instructions       General Comments pt was reeducated on importance of ROM and strengthening  in LUE/LLE to promote return in safety and abilities.    Pertinent Vitals/ Pain       Pain Assessment: Faces Pain Score: 4  Faces Pain Scale: Hurts little more Pain Location: left wrist with AROM/weight bearing Pain Descriptors / Indicators: Sore Pain Intervention(s): Limited activity within patient's tolerance  Home Living                                          Prior Functioning/Environment              Frequency  Min 1X/week        Progress Toward Goals  OT Goals(current goals can now be found in the care plan section)  Progress towards OT goals: Progressing toward goals  Acute Rehab OT Goals Patient Stated Goal: to be more independent OT Goal Formulation: With patient Time For Goal Achievement: 07/31/21 Potential to Achieve Goals: Good  Plan Frequency remains appropriate;Discharge plan remains appropriate    Co-evaluation    PT/OT/SLP Co-Evaluation/Treatment: Yes Reason for Co-Treatment: Complexity of the patient's impairments (multi-system involvement) PT goals addressed during session: Mobility/safety with mobility OT goals addressed during session: ADL's and self-care      AM-PAC OT "6 Clicks" Daily Activity     Outcome Measure   Help from another person eating meals?: None Help from another person taking care of  personal grooming?: None Help from another person toileting, which includes using toliet, bedpan, or urinal?: A Little Help from another person bathing (including washing, rinsing, drying)?: A Little Help from another person to put on and taking off regular upper body clothing?: A Little Help from another person to put on and taking off regular lower body clothing?: A Little 6 Click Score: 20    End of Session Equipment Utilized During Treatment: Rolling walker;Other (comment) (platform)  OT Visit Diagnosis: Unsteadiness on feet (R26.81);Muscle weakness (generalized) (M62.81);History of falling (Z91.81) Pain - Right/Left: Left Pain - part of body: Arm   Activity Tolerance Patient tolerated treatment well   Patient Left with call bell/phone within reach;in chair;with chair alarm set   Nurse Communication Mobility status        Time: 2951-8841 OT Time Calculation (min): 26 min  Charges: OT General Charges $OT Visit: 1 Visit OT Treatments $Self Care/Home Management : 8-22 mins  Gerrianne Scale, MS, OTR/L ascom [phone] 07/21/21, 4:42 PM

## 2021-07-21 NOTE — Assessment & Plan Note (Signed)
--   Treated by orthopedics with conservative management, now weightbearing

## 2021-07-21 NOTE — Hospital Course (Signed)
60 year old man PMH metastatic prostate cancer presented with a fall resulting in closed left ankle fracture and left wrist fracture.  This was treated with conservative management.  Discharge delayed by difficulty with disposition.

## 2021-07-21 NOTE — Assessment & Plan Note (Signed)
Stable

## 2021-07-21 NOTE — Assessment & Plan Note (Signed)
--   Treated with conservative management

## 2021-07-22 NOTE — TOC Progression Note (Signed)
Transition of Care [facility]) - Progression Note    Patient Details  Name: Ryan [name] MRN: [id number] Date of Birth: Nov 25, 1960  Transition of Care [facility]) CM/SW [place], RN Phone Number: 07/22/2021, 9:12 AM  Clinical Narrative:   Reached out to [facility], Requested updated information about when they are able to accept the patient, awaiting a call back         Expected Discharge Plan and Services                                                 Social Determinants of Health (SDOH) Interventions    Readmission Risk Interventions Readmission Risk Prevention Plan 04/23/2021  Transportation Screening Complete  PCP or Specialist Appt within 3-5 Days Complete  HRI or [place] Complete  Social Work Consult for [place] Planning/Counseling Not Complete  SW consult not completed comments RNCM assigned to patient  Palliative Care Screening Complete  Medication Review Press photographer) Complete  Some recent data might be hidden

## 2021-07-22 NOTE — Assessment & Plan Note (Addendum)
--   Treated with conservative management, follow-up with [name] first 2 weeks in November

## 2021-07-22 NOTE — Assessment & Plan Note (Signed)
Stable

## 2021-07-22 NOTE — Assessment & Plan Note (Addendum)
--   Treated by orthopedics with conservative management, now weightbearing, follow-up with [name] first 2 weeks in November.

## 2021-07-22 NOTE — Assessment & Plan Note (Signed)
--   Encourage proper intake

## 2021-07-22 NOTE — Hospital Course (Addendum)
60 year old man PMH metastatic prostate cancer presented with a fall resulting in closed left ankle fracture and left wrist fracture.  This was treated with conservative management.  Hospitalization uncomplicated.  Discharge delayed by difficulty with disposition.  Remains medically stable for discharge. --10/12-10/18 all medical issues remained stable, discharge delayed by lack of accepting facility.

## 2021-07-22 NOTE — Progress Notes (Signed)
  Progress Note    Ryan [name]   BMW:[id number]  DOB: [birth year]  DOA: 06/02/2021     49 Date of Service: 07/22/2021   60 year old man PMH metastatic prostate cancer presented with a fall resulting in closed left ankle fracture and left wrist fracture.  This was treated with conservative management.  Discharge delayed by difficulty with disposition.  Assessment and Plan * Closed left ankle fracture -- Treated by orthopedics with conservative management, now weightbearing  Left wrist fracture, closed, initial encounter -- Treated with conservative management  Prostate cancer metastatic to bone [facility]) -- Follow-up with Dr. Grayland [name] as an outpatient  Cancer cachexia [facility]) -- Encourage proper intake  Protein-calorie malnutrition, severe ([place]) -- Encourage proper intake  Anemia of chronic disease -- Stable   Subjective:  Feels ok, tired after PT today.  Objective Vitals:   07/22/21 0645 07/22/21 0736 07/22/21 1140 07/22/21 1504  BP: (!) 94/54 (!) 98/55 (!) 104/58 (!) 93/45  Pulse: 96 99 95 89  Resp: 20 14 15 14   Temp: 98.6 F (37 C) (!) 97.1 F (36.2 C) 98 F (36.7 C) 97.9 F (36.6 C)  TempSrc:      SpO2: 97% 97% 99% 98%  Weight:      Height:       62.5 kg  Vital signs were reviewed and unremarkable.  Exam Physical Exam Vitals reviewed.  Constitutional:      General: He is not in acute distress.    Appearance: He is not ill-appearing or toxic-appearing.  Cardiovascular:     Rate and Rhythm: Normal rate and regular rhythm.     Heart sounds: No murmur heard. Pulmonary:     Effort: Pulmonary effort is normal. No respiratory distress.     Breath sounds: No wheezing, rhonchi or rales.  Neurological:     Mental Status: He is alert.  Psychiatric:        Mood and Affect: Mood normal.        Behavior: Behavior normal.     Labs / Other Information There are no new results to review at this time.   Disposition Plan: Status is: Inpatient  Remains inpatient  appropriate because: awaiting ALF bed  Time spent: 15 minutes [facility] 07/22/2021, 4:48 PM

## 2021-07-22 NOTE — Assessment & Plan Note (Addendum)
--   Follow-up with [name] as an outpatient, next [facility] treatment last week in October

## 2021-07-23 LAB — CBC
HCT: 26.3 % — ABNORMAL LOW (ref 39.0–52.0)
Hemoglobin: 8.1 g/dL — ABNORMAL LOW (ref 13.0–17.0)
MCH: 25.2 pg — ABNORMAL LOW (ref 26.0–34.0)
MCHC: 30.8 g/dL (ref 30.0–36.0)
MCV: 81.9 fL (ref 80.0–100.0)
Platelets: 206 10*3/uL (ref 150–400)
RBC: 3.21 MIL/uL — ABNORMAL LOW (ref 4.22–5.81)
RDW: 14.1 % (ref 11.5–15.5)
WBC: 5.1 10*3/uL (ref 4.0–10.5)
nRBC: 0 % (ref 0.0–0.2)

## 2021-07-23 NOTE — TOC Progression Note (Addendum)
Transition of Care [facility]) - Progression Note    Patient Details  Name: Ryan [name] MRN: [id number] Date of Birth: 02/06/1961  Transition of Care [facility]) CM/SW [place], RN Phone Number: 07/23/2021, 9:52 AM  Clinical Narrative:   Delana Meyer with [facility] contacted me and stated that she is following up on this patient, she is to let me know when they are able to take the patient         Expected Discharge Plan and Services                                                 Social Determinants of Health (SDOH) Interventions    Readmission Risk Interventions Readmission Risk Prevention Plan 04/23/2021  Transportation Screening Complete  PCP or Specialist Appt within 3-5 Days Complete  HRI or Home Care Consult Complete  Social Work Consult for [place] Planning/Counseling Not Complete  SW consult not completed comments RNCM assigned to patient  Palliative Care Screening Complete  Medication Review Press photographer) Complete  Some recent data might be hidden

## 2021-07-23 NOTE — Progress Notes (Signed)
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  Pt recently here for multiple months with difficult d/c planning, did ultimatley d/c to homeless shelter and was gone only a few hours before fall with L ankle and wrist fractures with return to the ED.   OT comments  Pt seen for OT tx this date to f/u re: safety with ADLs/ADL mobility. He requires some time to motivate and encouage to participate as well as education about making more intentional/mindful advances toward becoming more mobile. Pt reluctant but eventually agreeable. He SETUP to don socks bed level. CTS with MIN/MOD A with platform 2WW. Pt demos F standing balance and completes fxl mobility to/from door. OT engages pt in wrist stretches to L side to reduce tension, improve elasticity of connective tissue with ultimate goal of facilitating improved functional use of L UE. Pt returned to bed with all needs met and in reach at end of session. Will continue to follow.   Recommendations for follow up therapy are one component of a multi-disciplinary discharge planning process, led by the attending physician.  Recommendations may be updated based on patient status, additional functional criteria and insurance authorization.    Follow Up Recommendations  SNF    Equipment Recommendations  Other (comment) (defer to next venue)    Recommendations for Other Services      Precautions / Restrictions Precautions Precautions: Fall Precaution Comments: WBAT Restrictions Weight Bearing Restrictions: Yes LUE Weight Bearing: Weight bearing as tolerated LLE Weight Bearing: Weight bearing as tolerated Other  Position/Activity Restrictions: splints removed 10/5, can progress to WBAT       Mobility Bed Mobility Overal bed mobility: Needs Assistance Bed Mobility: Supine to Sit;Sit to Supine Rolling: Supervision Sidelying to sit: Supervision            Transfers Overall transfer level: Needs assistance Equipment used: Rolling walker (2 wheeled);Left platform walker Transfers: Sit to/from Stand Sit to Stand: Min assist;Mod assist         General transfer comment: MIN/MOD A to CTS from EOB, only requires CGA/MIN A for stand to sit. Demos good concentric control, poor eccentric control.    Balance Overall balance assessment: Needs assistance Sitting-balance support: No upper extremity supported Sitting balance-Leahy Scale: Good     Standing balance support: Bilateral upper extremity supported Standing balance-Leahy Scale: Fair Standing balance comment: requires B UE support, platform to L side d/t limited toleranec for L wirst WB                           ADL either performed or assessed with clinical judgement   ADL Overall ADL's : Needs assistance/impaired                     Lower Body Dressing: Supervision/safety;Bed level Lower Body Dressing Details (indicate cue type and reason): to don socks             Functional mobility during ADLs: Min guard;Minimal assistance;Rolling walker (to/from door of room with platform 2WW)       Vision Patient Visual Report: No change from baseline     Perception     Praxis  Cognition Arousal/Alertness: Awake/alert Behavior During Therapy: WFL for tasks assessed/performed Overall Cognitive Status: Within Functional Limits for tasks assessed                                 General Comments: Pt requires increased time to motivate, but he is ultimately pleasant to work with after extensive time spent on encouagement and education re: importance of OOB activity.        Exercises Other  Exercises Other Exercises: OT engages pt in education re: importance of advacing daily OOB activity and engages pt in LB dressing as well as fxl mobility.   Shoulder Instructions       General Comments      Pertinent Vitals/ Pain       Pain Assessment: Faces Faces Pain Scale: Hurts little more Pain Location: left wrist with AROM/weight bearing Pain Descriptors / Indicators: Sore Pain Intervention(s): Limited activity within patient's tolerance  Home Living                                          Prior Functioning/Environment              Frequency  Min 1X/week        Progress Toward Goals  OT Goals(current goals can now be found in the care plan section)  Progress towards OT goals: Progressing toward goals (still self-limiting at times)  Acute Rehab OT Goals Patient Stated Goal: to be more independent OT Goal Formulation: With patient Time For Goal Achievement: 07/31/21 Potential to Achieve Goals: Good  Plan Frequency remains appropriate;Discharge plan remains appropriate    Co-evaluation                 AM-PAC OT "6 Clicks" Daily Activity     Outcome Measure   Help from another person eating meals?: None Help from another person taking care of personal grooming?: None Help from another person toileting, which includes using toliet, bedpan, or urinal?: A Little Help from another person bathing (including washing, rinsing, drying)?: A Little Help from another person to put on and taking off regular upper body clothing?: A Little Help from another person to put on and taking off regular lower body clothing?: A Little 6 Click Score: 20    End of Session Equipment Utilized During Treatment: Rolling walker;Other (comment) (platform L side)  OT Visit Diagnosis: Unsteadiness on feet (R26.81);Muscle weakness (generalized) (M62.81);History of falling (Z91.81) Pain - Right/Left: Left Pain - part of body: Arm   Activity Tolerance Patient  tolerated treatment well   Patient Left with call bell/phone within reach;in chair;with chair alarm set   Nurse Communication Mobility status        Time: 0224-0254 OT Time Calculation (min): 30 min  Charges: OT General Charges $OT Visit: 1 Visit OT Treatments $Self Care/Home Management : 8-22 mins $Therapeutic Activity: 8-22 mins  Gerrianne Scale, [place], OTR/L ascom [phone] 07/23/21, 4:02 PM

## 2021-07-23 NOTE — Plan of Care (Signed)
  Problem: Health Behavior/Discharge Planning: Goal: Ability to manage health-related needs will improve Outcome: Progressing   Problem: Coping: Goal: Level of anxiety will decrease Outcome: Progressing   Problem: Safety: Goal: Ability to remain free from injury will improve Outcome: Progressing   Problem: Skin Integrity: Goal: Risk for impaired skin integrity will decrease Outcome: Progressing   Problem: Activity: Goal: Ability to increase mobility will improve Outcome: Progressing

## 2021-07-23 NOTE — Progress Notes (Signed)
  Progress Note    Ryan [name]   MKL:[id number]  DOB: 1961-01-19  DOA: 06/02/2021     50 Date of Service: 07/23/2021   Clinical Course 60 year old man PMH metastatic prostate cancer presented with a fall resulting in closed left ankle fracture and left wrist fracture.  This was treated with conservative management.  Discharge delayed by difficulty with disposition.  Assessment and Plan * Closed left ankle fracture -- Treated by orthopedics with conservative management, now weightbearing  Left wrist fracture, closed, initial encounter -- Treated with conservative management  Prostate cancer metastatic to bone [facility]) -- Follow-up with [name] as an outpatient  Cancer cachexia [facility]) -- Encourage proper intake  Protein-calorie malnutrition, severe ([place]) -- Encourage proper intake  Anemia of chronic disease -- Stable   Subjective:  Feels ok today  Objective Vitals:   07/23/21 0542 07/23/21 0807 07/23/21 1159 07/23/21 1545  BP: (!) 90/44 (!) 96/49 (!) 106/47 (!) 97/48  Pulse: 97 91 95 93  Resp: 18 16 15 15   Temp: 98.4 F (36.9 C) 98.6 F (37 C) 98.4 F (36.9 C) 97.9 F (36.6 C)  TempSrc: Oral Oral    SpO2: 94% 96% 97% 97%  Weight:      Height:       62.5 kg  Vital signs were reviewed and unremarkable.  Exam Physical Exam Vitals reviewed.  Constitutional:      General: He is not in acute distress.    Appearance: He is not ill-appearing or toxic-appearing.  Cardiovascular:     Rate and Rhythm: Normal rate and regular rhythm.  Pulmonary:     Effort: Pulmonary effort is normal. No respiratory distress.     Breath sounds: No wheezing or rales.  Neurological:     Mental Status: He is alert.  Psychiatric:        Mood and Affect: Mood normal.        Behavior: Behavior normal.    Labs / Other Information My review of labs, imaging, notes and other tests is significant for     Hgb stable 8.1  Disposition Plan: Status is: Inpatient  Remains inpatient  appropriate because: no bed yet available  Time spent: 15 minutes [facility] 07/23/2021, 4:02 PM

## 2021-07-23 NOTE — TOC Progression Note (Signed)
Transition of Care [facility]) - Progression Note    Patient Details  Name: Ryan [name] MRN: [id number] Date of Birth: August 31, 1961  Transition of Care [facility]) CM/SW [place], RN Phone Number: 07/23/2021, 4:17 PM  Clinical Narrative:    Delana Meyer with [facility] to give update, they are trying to get in touch with the [name] and asking [name], they are trying to confirm that his disability was applied for on August 25th.  She stated that they will call back once they can confirm        Expected Discharge Plan and Services                                                 Social Determinants of Health (SDOH) Interventions    Readmission Risk Interventions Readmission Risk Prevention Plan 04/23/2021  Transportation Screening Complete  PCP or Specialist Appt within 3-5 Days Complete  HRI or [place] Complete  Social Work Consult for [place] Planning/Counseling Not Complete  SW consult not completed comments RNCM assigned to patient  Palliative Care Screening Complete  Medication Review Press photographer) Complete  Some recent data might be hidden

## 2021-07-24 LAB — CBC
HCT: 25.3 % — ABNORMAL LOW (ref 39.0–52.0)
Hemoglobin: 7.9 g/dL — ABNORMAL LOW (ref 13.0–17.0)
MCH: 25.6 pg — ABNORMAL LOW (ref 26.0–34.0)
MCHC: 31.2 g/dL (ref 30.0–36.0)
MCV: 81.9 fL (ref 80.0–100.0)
Platelets: 215 10*3/uL (ref 150–400)
RBC: 3.09 MIL/uL — ABNORMAL LOW (ref 4.22–5.81)
RDW: 14 % (ref 11.5–15.5)
WBC: 6.1 10*3/uL (ref 4.0–10.5)
nRBC: 0 % (ref 0.0–0.2)

## 2021-07-24 NOTE — Progress Notes (Signed)
  Progress Note    Ryan [name]   [id number]:[id number]  DOB: [birth year]  DOA: 06/02/2021     51 Date of Service: 07/24/2021   Clinical Course 60 year old man PMH metastatic prostate cancer presented with a fall resulting in closed left ankle fracture and left wrist fracture.  This was treated with conservative management.  Hospitalization uncomplicated.  Discharge delayed by difficulty with disposition.  Remains medically stable for discharge.  Assessment and Plan * Closed left ankle fracture -- Treated by orthopedics with conservative management, now weightbearing, follow-up with [name] first 2 weeks in November.  Left wrist fracture, closed, initial encounter -- Treated with conservative management, follow-up with [name] first 2 weeks in November  Prostate cancer metastatic to bone [facility]) -- Follow-up with [name] as an outpatient, next Zometa treatment last week in October  Cancer cachexia [facility]) -- Encourage proper intake  Protein-calorie malnutrition, severe ([place]) -- Encourage proper intake  Anemia of chronic disease -- Stable   Subjective:  Feels ok  Objective Vitals:   07/23/21 2006 07/24/21 0441 07/24/21 0801 07/24/21 1532  BP: (!) 109/49 (!) 93/42 (!) 98/58 (!) 103/44  Pulse: 87 93 93 85  Resp: 18 17 16 17   Temp: 97.8 F (36.6 C) 97.7 F (36.5 C) 98.1 F (36.7 C) 98.5 F (36.9 C)  TempSrc:   Oral   SpO2: 96% 94% 96% 94%  Weight:      Height:       62.5 kg  Vital signs were reviewed and unremarkable.   Exam Physical Exam Constitutional:      General: He is not in acute distress.    Appearance: He is not ill-appearing or toxic-appearing.  Neurological:     Mental Status: He is alert.    Labs / Other Information There are no new results to review at this time.  Disposition Plan: Status is: Inpatient  Remains inpatient appropriate because: unsafe discharge, awaiting bed  Time spent: 35 minutes [facility] 07/24/2021, 4:18 PM

## 2021-07-24 NOTE — Plan of Care (Signed)

## 2021-07-25 NOTE — Plan of Care (Signed)
  Problem: Health Behavior/Discharge Planning: Goal: Ability to manage health-related needs will improve Outcome: Progressing   

## 2021-07-25 NOTE — Progress Notes (Signed)
  Progress Note    Ryan [name]   [id number]:[id number]  DOB: 06/11/1961  DOA: 06/02/2021     52 Date of Service: 07/25/2021   Clinical Course 60 year old man PMH metastatic prostate cancer presented with a fall resulting in closed left ankle fracture and left wrist fracture.  This was treated with conservative management.  Hospitalization uncomplicated.  Discharge delayed by difficulty with disposition.  Remains medically stable for discharge.  Assessment and Plan * Closed left ankle fracture -- Treated by orthopedics with conservative management, now weightbearing, follow-up with [name] first 2 weeks in November.  Left wrist fracture, closed, initial encounter -- Treated with conservative management, follow-up with [name] first 2 weeks in November  Prostate cancer metastatic to bone [facility]) -- Follow-up with [name] as an outpatient, next Zometa treatment last week in October  Cancer cachexia [facility]) -- Encourage proper intake  Protein-calorie malnutrition, severe ([place]) -- Encourage proper intake  Anemia of chronic disease -- Stable   Subjective:  Feels ok  Objective Vitals:   07/24/21 2022 07/25/21 0427 07/25/21 0749 07/25/21 1212  BP: (!) 108/57 103/60 (!) 94/55 (!) 114/58  Pulse: 87 90 97 93  Resp: 16 16 16 16   Temp: 97.8 F (36.6 C) 98 F (36.7 C) 98.6 F (37 C) 97.7 F (36.5 C)  TempSrc:      SpO2: 96% 97% 95% 96%  Weight:      Height:       62.5 kg  Vital signs were reviewed and unremarkable.  Exam Physical Exam Constitutional:      General: He is not in acute distress. Cardiovascular:     Rate and Rhythm: Normal rate and regular rhythm.     Heart sounds: No murmur heard. Pulmonary:     Effort: Pulmonary effort is normal. No respiratory distress.     Breath sounds: No wheezing.  Neurological:     Mental Status: He is alert.  Psychiatric:        Mood and Affect: Mood normal.        Behavior: Behavior normal.    Labs / Other  Information There are no new results to review at this time.  Disposition Plan: Status is: Inpatient  Remains inpatient appropriate because: unsafe discharge, no bed yet available  DNR Rivaroxaban  SCDs  Time spent: 15 minutes [facility] 07/25/2021, 2:32 PM

## 2021-07-26 NOTE — Progress Notes (Signed)
  Progress Note Ryan [name]   [id number]  DOB: 10-05-1961  DOA: 06/02/2021     53 Date of Service: 07/26/2021   Clinical Course 60 year old man PMH metastatic prostate cancer presented with a fall resulting in closed left ankle fracture and left wrist fracture.  This was treated with conservative management.  Hospitalization uncomplicated.  Discharge delayed by difficulty with disposition.  Remains medically stable for discharge.  Assessment and Plan * Closed left ankle fracture -- Treated by orthopedics with conservative management, now weightbearing, follow-up with [name] first 2 weeks in November.  Left wrist fracture, closed, initial encounter -- Treated with conservative management, follow-up with [name] first 2 weeks in November  Prostate cancer metastatic to bone [facility]) -- Follow-up with [name] as an outpatient, next Zometa treatment last week in October  Cancer cachexia [facility]) -- Encourage proper intake  Protein-calorie malnutrition, severe ([place]) -- Encourage proper intake  Anemia of chronic disease -- Stable  Subjective:  Feels ok  Objective Vitals:   07/25/21 1947 07/26/21 0428 07/26/21 0800 07/26/21 1201  BP: 95/67 (!) 98/49 (!) 120/59 (!) 104/58  Pulse: 91 95 92 92  Resp: 17 17 16 16   Temp: 98.1 F (36.7 C) 98.2 F (36.8 C) 98.6 F (37 C) (!) 97.5 F (36.4 C)  TempSrc:    Oral  SpO2: 98% 97% 98% 97%  Weight:      Height:       62.5 kg  Vital signs were reviewed and unremarkable.  Exam Physical Exam Constitutional:      General: He is not in acute distress.    Appearance: He is not ill-appearing or toxic-appearing.  Neurological:     Mental Status: He is alert.  Psychiatric:        Mood and Affect: Mood normal.        Behavior: Behavior normal.    Labs / Other Information There are no new results to review at this time.  Disposition Plan: Status is: Inpatient  Remains inpatient appropriate because: unsafe  discharge  DNR  Time spent: 10 minutes [facility] 07/26/2021, 3:22 PM

## 2021-07-26 NOTE — Progress Notes (Signed)
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  Pt recently here for multiple months with difficult d/c planning, did ultimatley d/c to homeless shelter and was gone only a few hours before fall with L ankle and wrist fractures with return to the ED.    PT Comments    Pt seen for PT tx with pt agreeable. Pt is able to complete transfers & short distance gait into hallway (50 ft) with L PFRW & min assist. Pt with elevated mood with nursing staff providing encouragement while seeing him walking in the hallway. Will continue to follow pt acutely to progress gait as able.     Recommendations for follow up therapy are one component of a multi-disciplinary discharge planning process, led by the attending physician.  Recommendations may be updated based on patient status, additional functional criteria and insurance authorization.  Follow Up Recommendations  Supervision/Assistance - 24 hour;Home health PT     Equipment Recommendations  Rolling walker with 5" wheels;3in1 (PT) (LUE platform for RW)    Recommendations for Other Services       Precautions / Restrictions Precautions Precautions: Fall Precaution Comments: WBAT Restrictions Weight Bearing Restrictions: Yes RUE Weight Bearing: Weight bearing as tolerated LUE Weight Bearing: Weight bearing as tolerated RLE Weight Bearing: Weight bearing as tolerated LLE Weight Bearing: Weight bearing as tolerated     Mobility  Bed Mobility Overal bed mobility: Needs Assistance Bed Mobility: Supine to Sit     Supine to sit: Supervision;HOB elevated          Transfers Overall transfer level: Needs  assistance Equipment used: Rolling walker (2 wheeled);Left platform walker Transfers: Sit to/from Stand Sit to Stand: Min assist         General transfer comment: elevated EOB  Ambulation/Gait Ambulation/Gait assistance: Min assist Gait Distance (Feet): 50 Feet Assistive device: Left platform walker Gait Pattern/deviations: Step-to pattern Gait velocity: decreased   General Gait Details: Moderate lean onto PFRW.   Stairs             Wheelchair Mobility    Modified Rankin (Stroke Patients Only)       Balance Overall balance assessment: Needs assistance Sitting-balance support: No upper extremity supported Sitting balance-Leahy Scale: Good     Standing balance support: Bilateral upper extremity supported Standing balance-Leahy Scale: Fair                              Cognition Arousal/Alertness: Awake/alert Behavior During Therapy: Flat affect;WFL for tasks assessed/performed Overall Cognitive Status: Within Functional Limits for tasks assessed                                        Exercises      General Comments        Pertinent Vitals/Pain Pain Assessment: No/denies pain    Home Living                      Prior Function            PT Goals (current goals can  now be found in the care plan section) Acute Rehab PT Goals Patient Stated Goal: to be more independent PT Goal Formulation: With patient Time For Goal Achievement: 08/03/21 Potential to Achieve Goals: Fair Progress towards PT goals: Progressing toward goals    Frequency    Min 2X/week      PT Plan Current plan remains appropriate    Co-evaluation              AM-PAC PT "6 Clicks" Mobility   Outcome Measure  Help needed turning from your back to your side while in a flat bed without using bedrails?: None Help needed moving from lying on your back to sitting on the side of a flat bed without using bedrails?: A Little Help  needed moving to and from a bed to a chair (including a wheelchair)?: A Little Help needed standing up from a chair using your arms (e.g., wheelchair or bedside chair)?: A Little Help needed to walk in hospital room?: A Little Help needed climbing 3-5 steps with a railing? : A Lot 6 Click Score: 18    End of Session Equipment Utilized During Treatment: Gait belt Activity Tolerance: Patient tolerated treatment well Patient left: in bed;with call bell/phone within reach   PT Visit Diagnosis: Unsteadiness on feet (R26.81);Muscle weakness (generalized) (M62.81);Difficulty in walking, not elsewhere classified (R26.2);History of falling (Z91.81)     Time: 6378-5885 PT Time Calculation (min) (ACUTE ONLY): 11 min  Charges:  $Therapeutic Activity: 8-22 mins                     Ryan [name], PT, DPT 07/26/21, 12:55 PM    Ryan [name] 07/26/2021, 12:54 PM

## 2021-07-26 NOTE — Progress Notes (Signed)
Pt's PIV in right forearm leaking with flush. Site assessed and IV no longer functional. PIV removed and pt refused replacement. Messaged MD Damita Dunnings via secure chat to ask if patient can be without an IV since he's no longer receiving IV medications. Per MD Damita Dunnings, defer to attending MD on first shift. Will pass along to next RN.  Earleen Reaper, RN

## 2021-07-27 MED ORDER — ENSURE ENLIVE PO LIQD
237.0000 mL | Freq: Three times a day (TID) | ORAL | Status: DC
  Administered 2021-07-27 – 2021-08-29 (×54): 237 mL via ORAL

## 2021-07-27 NOTE — Progress Notes (Signed)
  Progress Note Ryan [name]   [id number]:[id number]  DOB: [birth year]  DOA: 06/02/2021     54 Date of Service: 07/27/2021   Clinical Course 60 year old man PMH metastatic prostate cancer presented with a fall resulting in closed left ankle fracture and left wrist fracture.  This was treated with conservative management.  Hospitalization uncomplicated.  Discharge delayed by difficulty with disposition.  Remains medically stable for discharge. --10/12-10/18 all medical issues remained stable, discharge delayed by lack of accepting facility.  Consults Orthopedics Oncology  Assessment and Plan * Closed left ankle fracture -- Treated by orthopedics with conservative management, now weightbearing, follow-up with [name] first 2 weeks in November.  Left wrist fracture, closed, initial encounter -- Treated with conservative management, follow-up with [name] first 2 weeks in November  Prostate cancer metastatic to bone [facility]) -- Follow-up with [name] as an outpatient, next Zometa treatment last week in October  Cancer cachexia [facility]) -- Encourage proper intake  Protein-calorie malnutrition, severe ([place]) -- Encourage proper intake  Anemia of chronic disease -- Stable   Subjective:  Feels ok  Objective Vitals:   07/27/21 0607 07/27/21 0728 07/27/21 1143 07/27/21 1541  BP: (!) 104/54 (!) 90/57 110/61 96/61  Pulse: 85 93 92 90  Resp: 16 16 16 16   Temp: 98.4 F (36.9 C) 98.5 F (36.9 C) (!) 97.5 F (36.4 C) (!) 97.4 F (36.3 C)  TempSrc: Oral     SpO2: 97% 97% 98% 97%  Weight:      Height:       63.3 kg  Vital signs were reviewed and unremarkable.  Exam Physical Exam Constitutional:      General: He is not in acute distress.    Appearance: He is not ill-appearing or toxic-appearing.  Neurological:     Mental Status: He is alert.  Psychiatric:        Mood and Affect: Mood normal.    Labs / Other Information There are no new results to review at this  time.  Disposition Plan: Status is: Inpatient  Remains inpatient appropriate because: no safe discharge yet secured  DNR Refuses enoxaparin  Time spent: 10 minutes [facility] 07/27/2021, 4:39 PM

## 2021-07-27 NOTE — Evaluation (Signed)
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  Pt recently here for multiple months with difficult d/c planning, did ultimatley d/c to homeless shelter and was gone only a few hours before fall with L ankle and wrist fractures with return to the ED.   Clinical Impression   Pt seen for OT re-evaluation on this date in setting of prolonged hospitalization. Upon arrival to room, pt awake and sitting upright in bed. Pt agreeable to OT tx and requiring little motivation to participate this date. Pt currently presents with LUE pain, decreased strength, decreased balance, and decreased activity tolerance. Due to these functional impairments, pt requires SUPERVISION for bed mobility, SUPERVISION/SET-UP for seated UB ADLs, MOD A for sit<>stand transfers (with bed in lowest height), and MIN GUARD for functional mobility of short household distances (~38ft) with L platform walker. Pt was able to briefly perform standing hand hygiene, however required MIN GUARD and b/l UE support from sink to attempt. At end of session, pt re-educated on L wrist/hand AROM exercises to increased functional ROM, with pt demonstrating good understanding. Pt is making good progress, and 4/4 goals have been updated to reflect current abilities. Pt continues to benefit from skilled OT services to maximize return to PLOF and minimize risk of future falls, injury, caregiver burden, and readmission. Discharge recommendation remains appropriate as pt continues to requires significant assist to perform functional mobility and to perform standing ADLs without b/l UE support.       Recommendations for follow up  therapy are one component of a multi-disciplinary discharge planning process, led by the attending physician.  Recommendations may be updated based on patient status, additional functional criteria and insurance authorization.   Follow Up Recommendations  SNF    Equipment Recommendations  Other (comment) (defer to next venue)       Precautions / Restrictions Precautions Precautions: Fall Restrictions Weight Bearing Restrictions: Yes LUE Weight Bearing: Weight bearing as tolerated LLE Weight Bearing: Weight bearing as tolerated      Mobility Bed Mobility Overal bed mobility: Needs Assistance Bed Mobility: Supine to Sit       Sit to supine: Supervision   General bed mobility comments: no physical assist required for sitting upright. Requires supervision for scooting hips toward EOB    Transfers Overall transfer level: Needs assistance Equipment used: Rolling walker (2 wheeled);Left platform walker Transfers: Sit to/from Stand Sit to Stand: Mod assist         General transfer comment: MOD A for upward momentum with bed at lowest height.    Balance Overall balance assessment: Needs assistance Sitting-balance support: No upper extremity supported Sitting balance-Leahy Scale: Good Sitting balance - Comments: Good sitting balance reaching within BOS while seated EOB   Standing balance support: Bilateral upper extremity supported;During functional activity Standing balance-Leahy Scale: Fair Standing balance comment: MIN GUARD for standing hand hygiene, with pt requiring b/l UE support via forearms on sink                           ADL either performed or assessed with clinical judgement   ADL Overall ADL's : Needs assistance/impaired     Grooming: Wash/dry hands;Min guard;Standing  Upper Body Dressing : Supervision/safety;Set up;Sitting Upper Body Dressing Details (indicate cue type and reason): To don/doff posterior gown while sitting  EOB Lower Body Dressing: Modified independent;Bed level Lower Body Dressing Details (indicate cue type and reason): to don socks             Functional mobility during ADLs: Min guard (to walk 40 ft with platform 2WW)        Pertinent Vitals/Pain Pain Assessment: Faces Faces Pain Scale: Hurts little more Pain Location: left wrist with AROM/weight bearing Pain Descriptors / Indicators: Sore Pain Intervention(s): Limited activity within patient's tolerance;Monitored during session        Extremity/Trunk Assessment Upper Extremity Assessment Upper Extremity Assessment: Generalized weakness;LUE deficits/detail LUE Deficits / Details: Splint has been removed and pt is now WBAT. Pt presents with stiffness and pain during AROM of hand and wrist. Pt has difficulty performing digit opposition d/t pain   Lower Extremity Assessment Lower Extremity Assessment: Generalized weakness   Cervical / Trunk Assessment Cervical / Trunk Assessment: Normal      Cognition Arousal/Alertness: Awake/alert Behavior During Therapy: WFL for tasks assessed/performed Overall Cognitive Status: Within Functional Limits for tasks assessed                                 General Comments: Pt agreeable to OOB mobility and UE therex, requiring very little encouragement this date. Pleasant and agreeable throughout      Exercises General Exercises - Upper Extremity Wrist Flexion: AROM;Both;5 reps;Seated Wrist Extension: AROM;Both;5 reps;Seated Digit Composite Flexion: AROM;Both;10 reps;Seated Composite Extension: AROM;Both;10 reps;Seated          OT Problem List: Decreased strength;Decreased knowledge of use of DME or AE;Decreased range of motion;Decreased coordination;Decreased activity tolerance;Impaired balance (sitting and/or standing);Pain;Impaired sensation      OT Treatment/Interventions: Self-care/ADL training;Therapeutic exercise;Patient/family education;Balance  training;Energy conservation;DME and/or AE instruction;Therapeutic activities    OT Goals(Current goals can be found in the care plan section) Acute Rehab OT Goals Patient Stated Goal: to be more independent OT Goal Formulation: With patient Time For Goal Achievement: 08/10/21 Potential to Achieve Goals: Good ADL Goals Pt Will Perform Grooming: with set-up;with supervision;standing Pt Will Perform Upper Body Dressing: with supervision;standing Pt Will Perform Lower Body Dressing: with modified independence;sitting/lateral leans Pt Will Transfer to Toilet: with min assist;ambulating;bedside commode  OT Frequency: Min 1X/week    AM-PAC OT "6 Clicks" Daily Activity     Outcome Measure Help from another person eating meals?: None Help from another person taking care of personal grooming?: A Little Help from another person toileting, which includes using toliet, bedpan, or urinal?: A Little Help from another person bathing (including washing, rinsing, drying)?: A Little Help from another person to put on and taking off regular upper body clothing?: A Little Help from another person to put on and taking off regular lower body clothing?: A Little 6 Click Score: 19   End of Session Equipment Utilized During Treatment: Rolling walker;Other (comment) (platform L side) Nurse Communication: Mobility status  Activity Tolerance: Patient tolerated treatment well Patient left: with call bell/phone within reach;in chair;with chair alarm set  OT Visit Diagnosis: Unsteadiness on feet (R26.81);Muscle weakness (generalized) (M62.81);History of falling (Z91.81) Pain - Right/Left: Left Pain - part of body: Arm                Time: 1010-1035 OT Time Calculation (min): 25 min Charges:  OT General Charges $OT Visit: 1 Visit OT  Evaluation $OT Re-eval: 1 Re-eval OT Treatments $Self Care/Home Management : 8-22 mins $Therapeutic Activity: 8-22 mins  Fredirick Maudlin, OTR/L [place]

## 2021-07-27 NOTE — TOC Progression Note (Addendum)
Transition of Care [facility]) - Progression Note    Patient Details  Name: Ryan [name] MRN: [id number] Date of Birth: 1960-10-19  Transition of Care [facility]) CM/SW [place], RN Phone Number: 07/27/2021, 10:12 AM  Clinical Narrative:   Left a voice mail with [facility] from [facility] requesting and update on the status of the patient being able to go to [place] a message from [place] stating that the is unable to reach [facility] the [facility], I sent her the email address for Dyann Ruddle as [email or web address]       Expected Discharge Plan and Services                                                 Social Determinants of Health (SDOH) Interventions    Readmission Risk Interventions Readmission Risk Prevention Plan 04/23/2021  Transportation Screening Complete  PCP or Specialist Appt within 3-5 Days Complete  HRI or Home Care Consult Complete  Social Work Consult for [place] Planning/Counseling Not Complete  SW consult not completed comments RNCM assigned to patient  Palliative Care Screening Complete  Medication Review [employer]) Complete  Some recent data might be hidden

## 2021-07-27 NOTE — Progress Notes (Signed)
Nutrition Follow-up  DOCUMENTATION CODES:  Severe malnutrition in context of chronic illness, Underweight  INTERVENTION:  Continue current diet as ordered Continue Ensure Enlive po TID, each supplement provides 350 kcal and 20 grams of protein Continue MVI with minerals daily Daily weights  NUTRITION DIAGNOSIS:  Severe Malnutrition related to chronic illness, cancer and cancer related treatments as evidenced by severe fat depletion, severe muscle depletion.  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 Recently admitted 6/21-8/24. Discharged to homeless shelter then returned to ED a few hours later after a fall and pain to his wrist.  Stable for dc, case management team working on placement. Limited meal intake to review. Noted that pt has been refusing several doses of his ensure over the last few days. Will decrease to TID to avoid wasting. Pt not compliant with nutrition plan.  Average Meal Intake: 8/27-8/29: 72% intake x 2 recorded meals (50-94%) 8/30-9/7: 63% intake x 2 recorded meals (25-100%) 9/8-9/14: 50% intake x 3 recorded meals (50%) 9/15-9/20: 35% intake x 2 recorded meals (20-50%) 9/21-9/27: 50% intake x 3 recorded meals (0-100%) 9/28-10/4: limited meals recorded 10/5-10/10: 100% intake x 1 recorded meals 10/11-10/18: no meals recorded  Nutritionally Relevant Medications: Scheduled Meds:  calcium carbonate  1,250 mg Oral TID WC   feeding supplement  237 mL Oral QID   magnesium oxide  400 mg Oral Daily   midodrine  5 mg Oral TID WC   mirtazapine  15 mg Oral QHS   multivitamin with minerals  1 tablet Oral Daily   senna-docusate  1 tablet Oral BID   PRN Meds: ondansetron  Labs  Reviewed  NUTRITION - FOCUSED PHYSICAL EXAM: Flowsheet Row Most Recent Value  Orbital Region Mild depletion  Upper Arm Region Severe depletion  Thoracic and Lumbar Region Severe depletion  Buccal Region Mild depletion  Temple Region Moderate depletion  Clavicle Bone Region Severe depletion  Clavicle and Acromion Bone Region Severe depletion  Scapular Bone Region Severe depletion  Dorsal Hand Severe depletion  Patellar Region Severe depletion  Anterior Thigh Region Severe depletion  Posterior Calf Region Severe depletion  Edema (RD Assessment) None  Hair Reviewed  Eyes Reviewed  Mouth Reviewed  Skin Reviewed  Nails Reviewed   Diet Order:   Diet Order             Diet regular Room service appropriate? Yes; Fluid consistency: Thin  Diet effective now                  EDUCATION NEEDS:  Education needs have been addressed  Skin:  Skin Assessment: Reviewed RN Assessment  Last BM:  10/14 - type 1  Height:  Ht Readings from Last 1 Encounters:  07/06/21 6\' 5"  (1.956 m)   Weight:  Wt Readings from Last 1 Encounters:  07/27/21 63.3 kg   Ideal Body Weight:  94.5 kg  BMI:  Body mass index is 16.55 kg/m.  Estimated Nutritional Needs:  Kcal:  2400-2600 Protein:  130-150 grams Fluid:  >2.4 L  Ryan [name], RD, LDN Clinical Dietitian Pager on [place]

## 2021-07-28 NOTE — TOC Progression Note (Signed)
Transition of Care [facility]) - Progression Note    Patient Details  Name: Sye Schroepfer MRN: [id number] Date of Birth: 1961-09-18  Transition of Care [facility]) CM/SW [place], RN Phone Number: 07/28/2021, 10:41 AM  Clinical Narrative:    Unable to reach Elmo Putt at the [facility] on the phone, sent her a secure email requesting information on if they are assisting the patient to get disability after they were faxed the forms 07/07/21 giving them the permission to work on his behalf.  Requested information to be able to get a status update if in the event they are not working with the patient.  Awaiting a responce        Expected Discharge Plan and Services                                                 Social Determinants of Health (SDOH) Interventions    Readmission Risk Interventions Readmission Risk Prevention Plan 04/23/2021  Transportation Screening Complete  PCP or Specialist Appt within 3-5 Days Complete  HRI or [place] Complete  Social Work Consult for [place] Planning/Counseling Not Complete  SW consult not completed comments RNCM assigned to patient  Palliative Care Screening Complete  Medication Review Press photographer) Complete  Some recent data might be hidden

## 2021-07-28 NOTE — Progress Notes (Signed)
PROGRESS NOTE    Ryan [name]  OXB:[id number] DOB: 11-12-1960 DOA: 06/02/2021 PCP: Pcp, No   Brief Narrative: Taken from prior notes. 60 year old man PMH metastatic prostate cancer presented with a fall resulting in closed left ankle fracture and left wrist fracture.  This was treated with conservative management.  Hospitalization uncomplicated.  Discharge delayed by difficulty with disposition.  Remains medically stable for discharge.  Accepting facility has some concern about the start date of his disability.  Subjective: Patient was having some dizziness while working with PT and was concerned about elevation of his blood pressure during that time.  Repeat blood pressure within goal.  Assessment & Plan:   Principal Problem:   Closed left ankle fracture Active Problems:   Fall   Anemia of chronic disease   Protein-calorie malnutrition, severe ([place])   Cancer cachexia ([place])   Prostate cancer metastatic to bone ([place])   Left wrist fracture, closed, initial encounter   Hypocalcemia   Hyperkalemia   Closed fracture of left distal radius   Closed bimalleolar fracture of left ankle  Closed left ankle and wrist fracture.  Secondary to fall.  Managed conservatively by orthopedic surgery.  Splint has been removed.  Able to work with PT and nonweightbearing. -Outpatient orthopedic follow-up in 2 weeks.  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  Being followed up by oncology as an outpatient.  Next Zometa treatment in last week of October.  Cancer cachexia/protein caloric malnutrition, severe. -Encourage p.o. intake with supplements.  Anemia of chronic disease. -Stable  Objective: Vitals:   07/27/21 2022 07/28/21 0421 07/28/21 0752 07/28/21 1107  BP: 109/60 (!) 110/58 (!) 107/49 (!) 102/57  Pulse: 86 80 93 95  Resp: 16 18 18 16   Temp: 98.6 F (37 C) 98.1 F (36.7 C) 98.2 F (36.8 C) (!) 97.5 F (36.4 C)  TempSrc:   Oral Oral  SpO2: 98% 99% 98% 97%  Weight:       Height:        Intake/Output Summary (Last 24 hours) at 07/28/2021 1508 Last data filed at 07/28/2021 1417 Gross per 24 hour  Intake 120 ml  Output 775 ml  Net -655 ml   Filed Weights   07/06/21 1406 07/07/21 1443 07/27/21 0500  Weight: 65.6 kg 62.5 kg 63.3 kg    Examination:  General exam: Appears calm and comfortable  Respiratory system: Clear to auscultation. Respiratory effort normal. Cardiovascular system: S1 & S2 heard, RRR.   Gastrointestinal system: Soft, nontender, nondistended, bowel sounds positive. Central nervous system: Alert and oriented. No focal neurological deficits. Extremities: No edema, no cyanosis, pulses intact and symmetrical. Psychiatry: Judgement and insight appear normal. Mood & affect appropriate.    DVT prophylaxis: Xarelto Code Status: DNR Family Communication:  Disposition Plan:  Status is: Inpatient  Remains inpatient appropriate because: Unsafe discharge   level of care: Med-Surg  All the records are reviewed and case discussed with Care Management/Social Worker. Management plans discussed with the patient, nursing and they are in agreement.  Consultants:  Orthopedic surgery Oncology:  Procedures:  Antimicrobials:   Data Reviewed: I have personally reviewed following labs and imaging studies  CBC: Recent Labs  Lab 07/23/21 0622 07/24/21 0433  WBC 5.1 6.1  HGB 8.1* 7.9*  HCT 26.3* 25.3*  MCV 81.9 81.9  PLT 206 683   Basic Metabolic Panel: No results for input(s): NA, K, CL, CO2, GLUCOSE, BUN, CREATININE, CALCIUM, MG, PHOS in the last 168 hours. GFR: Estimated Creatinine Clearance: 89 mL/min (by  C-G formula based on SCr of 0.67 mg/dL). Liver Function Tests: No results for input(s): AST, ALT, ALKPHOS, BILITOT, PROT, ALBUMIN in the last 168 hours. No results for input(s): LIPASE, AMYLASE in the last 168 hours. No results for input(s): AMMONIA in the last 168 hours. Coagulation Profile: No results for input(s): INR,  PROTIME in the last 168 hours. Cardiac Enzymes: No results for input(s): CKTOTAL, CKMB, CKMBINDEX, TROPONINI in the last 168 hours. BNP (last 3 results) No results for input(s): PROBNP in the last 8760 hours. HbA1C: No results for input(s): HGBA1C in the last 72 hours. CBG: No results for input(s): GLUCAP in the last 168 hours. Lipid Profile: No results for input(s): CHOL, HDL, LDLCALC, TRIG, CHOLHDL, LDLDIRECT in the last 72 hours. Thyroid Function Tests: No results for input(s): TSH, T4TOTAL, FREET4, T3FREE, THYROIDAB in the last 72 hours. Anemia Panel: No results for input(s): VITAMINB12, FOLATE, FERRITIN, TIBC, IRON, RETICCTPCT in the last 72 hours. Sepsis Labs: No results for input(s): PROCALCITON, LATICACIDVEN in the last 168 hours.  No results found for this or any previous visit (from the past 240 hour(s)).   Radiology Studies: No results found.  Scheduled Meds:  calcium carbonate  1,250 mg Oral TID WC   feeding supplement  237 mL Oral TID BM   magnesium oxide  400 mg Oral Daily   midodrine  5 mg Oral TID WC   mirtazapine  15 mg Oral QHS   multivitamin with minerals  1 tablet Oral Daily   rivaroxaban  10 mg Oral Q supper   senna-docusate  1 tablet Oral BID   Continuous Infusions:  sodium chloride Stopped (06/04/21 1216)     LOS: 55 days   Time spent: 30 minutes. More than 50% of the time was spent in counseling/coordination of care  Lorella Nimrod, MD [facility]  If 7PM-7AM, please contact night-coverage [email or web address]  07/28/2021, 3:08 PM   This record has been created using Systems analyst. Errors have been sought and corrected,but may not always be located. Such creation errors do not reflect on the standard of care.

## 2021-07-28 NOTE — Progress Notes (Signed)
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  Pt recently here for multiple months with difficult d/c planning, did ultimatley d/c to homeless shelter and was gone only a few hours before fall with L ankle and wrist fractures with return to the ED.    PT Comments    Entered pt's room and agreeable to treatment. Pt educated regarding his WBAT status in all extremities but continues to be worried about placing weight on his L wrist. Pt able to complete bed mobility independently. Pt requiring minA for sit to stand from lower surface due to decreased LE strength. Pt reporting dizziness and change in vision when standing up. In standing, obtained BP readings on L calf due to abnormal readings on R UE:  Standing 186/139 mmHg. Pt instructed to lay back down in supine and waited 5 minutes. Re-obtained BP readings: Supine 100/62 mmHg, Sitting 126/90, Standing without weight on L LE 156/133 mmHg. Deferred any further ambulation for today's session due to blood pressure readings and pt symptoms. RN and MD notified of blood pressure readings. Goals updated to improve overall mobility and independence with LRAD. Will continue to work with patient while admitted to improve functional strength and activity tolerance.    Recommendations for follow up therapy are one component of a multi-disciplinary discharge planning process, led by the attending physician.  Recommendations may be updated based on patient status, additional functional criteria and insurance authorization.  Follow Up Recommendations  Supervision/Assistance - 24 hour;Home health PT     Equipment Recommendations  Rolling  walker with 5" wheels;3in1 (PT)    Recommendations for Other Services       Precautions / Restrictions Precautions Precautions: Fall Restrictions LUE Weight Bearing: Weight bearing as tolerated LLE Weight Bearing: Weight bearing as tolerated     Mobility  Bed Mobility Overal bed mobility: Independent Bed Mobility: Supine to Sit;Sit to Supine     Supine to sit: Independent Sit to supine: Independent   General bed mobility comments: Able to scoot himself up in bed with cues for hooklying and reaching for bed rails. Requires multiple attempts to complete    Transfers Overall transfer level: Needs assistance Equipment used: Rolling walker (2 wheeled);Left platform walker Transfers: Sit to/from Stand Sit to Stand: Min assist;From elevated surface         General transfer comment: Pt stood with minA from lower surface and elevated surface. Pt reporting that he did not want to remove L platform due to increased wrist pain when pushing from bed  Ambulation/Gait             General Gait Details: Deferred at this time due to abnormal blood pressure readings and pt symptomatic   Stairs             Wheelchair Mobility    Modified Rankin (Stroke Patients Only)       Balance Overall balance assessment: Needs assistance Sitting-balance support: No upper extremity supported Sitting balance-Leahy Scale: Good     Standing balance support: Bilateral upper extremity supported;During functional activity Standing balance-Leahy Scale: Fair Standing balance comment: Pt reporting dizziness in standing today but able to maintain standing posture with B UE support and CGA.  Cognition Arousal/Alertness: Awake/alert Behavior During Therapy: WFL for tasks assessed/performed Overall Cognitive Status: Within Functional Limits for tasks assessed                                        Exercises Other Exercises Other  Exercises: Seated LAQ 2 x 10; Marching 1 x 10. Supine: bridges 2 x 10, SLR 1 x 8 bilaterally with cues for breathing and to avoid valsalva maneuver    General Comments        Pertinent Vitals/Pain Pain Assessment: No/denies pain    Home Living                      Prior Function            PT Goals (current goals can now be found in the care plan section) Acute Rehab PT Goals Patient Stated Goal: to be more independent PT Goal Formulation: With patient Time For Goal Achievement: 08/11/21 Potential to Achieve Goals: Fair Progress towards PT goals: Progressing toward goals    Frequency    Min 2X/week      PT Plan Current plan remains appropriate    Co-evaluation              AM-PAC PT "6 Clicks" Mobility   Outcome Measure  Help needed turning from your back to your side while in a flat bed without using bedrails?: None Help needed moving from lying on your back to sitting on the side of a flat bed without using bedrails?: None Help needed moving to and from a bed to a chair (including a wheelchair)?: A Little Help needed standing up from a chair using your arms (e.g., wheelchair or bedside chair)?: A Lot Help needed to walk in hospital room?: A Little Help needed climbing 3-5 steps with a railing? : A Lot 6 Click Score: 18    End of Session Equipment Utilized During Treatment: Gait belt Activity Tolerance: Patient tolerated treatment well Patient left: in bed;with call bell/phone within reach Nurse Communication: Mobility status PT Visit Diagnosis: Unsteadiness on feet (R26.81);Muscle weakness (generalized) (M62.81);Difficulty in walking, not elsewhere classified (R26.2);History of falling (Z91.81)     Time: 1009-1040 PT Time Calculation (min) (ACUTE ONLY): 31 min  Charges:  $Therapeutic Exercise: 8-22 mins $Therapeutic Activity: 8-22 mins                     Andrey Campanile, SPT    Andrey Campanile 07/28/2021, 2:31 PM

## 2021-07-28 NOTE — TOC Progression Note (Signed)
Transition of Care [facility]) - Progression Note    Patient Details  Name: Avyon Herendeen MRN: [id number] Date of Birth: 1961/09/26  Transition of Care [facility]) CM/SW [place], RN Phone Number: 07/28/2021, 2:38 PM  Clinical Narrative:   Received a secure email message from [place] at the [facility] "we did receive the withdrawal fax.  That fax(withdrawal) needs to be sent to [facility] office as they aren't aware.  As of 06/30/21 Mr. Suddreth claim was still pending with the Disability Determination Department.  We can follow up on Monday 08/02/21 which gives [place] a 30-day window"  I faxed the withdrawal form to the [place] office at [phone] per the number Elmo Putt at the [facility] provided me, the fax did successfully go thru         Expected Discharge Plan and Services                                                 Social Determinants of Health (SDOH) Interventions    Readmission Risk Interventions Readmission Risk Prevention Plan 04/23/2021  Transportation Screening Complete  PCP or Specialist Appt within 3-5 Days Complete  HRI or [place] Complete  Social Work Consult for [place] Planning/Counseling Not Complete  SW consult not completed comments RNCM assigned to patient  Palliative Care Screening Complete  Medication Review Press photographer) Complete  Some recent data might be hidden

## 2021-07-29 MED ORDER — ZOLEDRONIC ACID 4 MG/5ML IV CONC
4.0000 mg | Freq: Once | INTRAVENOUS | Status: AC
  Administered 2021-07-30: 4 mg via INTRAVENOUS
  Filled 2021-07-29: qty 5

## 2021-07-29 NOTE — Progress Notes (Signed)
PROGRESS NOTE    Ryan [name]  MMH:[id number] DOB: December 24, 1960 DOA: 06/02/2021 PCP: Pcp, No   Brief Narrative: Taken from prior notes. 60 year old man PMH metastatic prostate cancer presented with a fall resulting in closed left ankle fracture and left wrist fracture.  This was treated with conservative management.  Hospitalization uncomplicated.  Discharge delayed by difficulty with disposition.  Remains medically stable for discharge.  Accepting facility has some concern about the start date of his disability.  Subjective: Patient was seen and examined today.  Writing some notes in a notebook.  He was very hopeful that he will have some answers from his disability application very soon.  No other complaints.  Assessment & Plan:   Principal Problem:   Closed left ankle fracture Active Problems:   Fall   Anemia of chronic disease   Protein-calorie malnutrition, severe ([facility])   Cancer cachexia ([place])   Prostate cancer metastatic to bone ([facility])   Left wrist fracture, closed, initial encounter   Hypocalcemia   Hyperkalemia   Closed fracture of left distal radius   Closed bimalleolar fracture of left ankle  Closed left ankle and wrist fracture.  Secondary to fall.  Managed conservatively by orthopedic surgery.  Splint has been removed.  Able to work with PT and weightbearing. -Outpatient orthopedic follow-up in 2 weeks.  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  Being followed up by oncology as an outpatient.  Next Zometa treatment in last week of October. -Sent a message to his oncologist [name] to see if it is due to soon and if they can arrange while he is in the hospital.  Cancer cachexia/protein caloric malnutrition, severe. -Encourage p.o. intake with supplements.  Anemia of chronic disease. -Stable  Objective: Vitals:   07/28/21 1530 07/28/21 2015 07/29/21 0447 07/29/21 0844  BP: (!) 110/57 105/63 (!) 106/52 (!) 97/58  Pulse: 87 88 86 93  Resp: 16 16 16 15    Temp: 98.4 F (36.9 C) 98.1 F (36.7 C) (!) 97.5 F (36.4 C) (!) 97.5 F (36.4 C)  TempSrc:  Oral Oral   SpO2: 99% 100% 96% 97%  Weight:      Height:        Intake/Output Summary (Last 24 hours) at 07/29/2021 1438 Last data filed at 07/29/2021 1423 Gross per 24 hour  Intake 240 ml  Output 100 ml  Net 140 ml    Filed Weights   07/06/21 1406 07/07/21 1443 07/27/21 0500  Weight: 65.6 kg 62.5 kg 63.3 kg    Examination:  General.  Malnourished gentleman, in no acute distress. Pulmonary.  Lungs clear bilaterally, normal respiratory effort. CV.  Regular rate and rhythm, no JVD, rub or murmur. Abdomen.  Soft, nontender, nondistended, BS positive. CNS.  Alert and oriented x3.  No focal neurologic deficit. Extremities.  No edema, no cyanosis, pulses intact and symmetrical. Psychiatry.  Judgment and insight appears normal.   DVT prophylaxis: Xarelto Code Status: DNR Family Communication:  Disposition Plan:  Status is: Inpatient  Remains inpatient appropriate because: Unsafe discharge   level of care: Med-Surg  All the records are reviewed and case discussed with Care Management/Social Worker. Management plans discussed with the patient, nursing and they are in agreement.  Consultants:  Orthopedic surgery Oncology:  Procedures:  Antimicrobials:   Data Reviewed: I have personally reviewed following labs and imaging studies  CBC: Recent Labs  Lab 07/23/21 0622 07/24/21 0433  WBC 5.1 6.1  HGB 8.1* 7.9*  HCT 26.3* 25.3*  MCV 81.9  81.9  PLT 206 962    Basic Metabolic Panel: No results for input(s): NA, K, CL, CO2, GLUCOSE, BUN, CREATININE, CALCIUM, MG, PHOS in the last 168 hours. GFR: Estimated Creatinine Clearance: 89 mL/min (by C-G formula based on SCr of 0.67 mg/dL). Liver Function Tests: No results for input(s): AST, ALT, ALKPHOS, BILITOT, PROT, ALBUMIN in the last 168 hours. No results for input(s): LIPASE, AMYLASE in the last 168 hours. No results for  input(s): AMMONIA in the last 168 hours. Coagulation Profile: No results for input(s): INR, PROTIME in the last 168 hours. Cardiac Enzymes: No results for input(s): CKTOTAL, CKMB, CKMBINDEX, TROPONINI in the last 168 hours. BNP (last 3 results) No results for input(s): PROBNP in the last 8760 hours. HbA1C: No results for input(s): HGBA1C in the last 72 hours. CBG: No results for input(s): GLUCAP in the last 168 hours. Lipid Profile: No results for input(s): CHOL, HDL, LDLCALC, TRIG, CHOLHDL, LDLDIRECT in the last 72 hours. Thyroid Function Tests: No results for input(s): TSH, T4TOTAL, FREET4, T3FREE, THYROIDAB in the last 72 hours. Anemia Panel: No results for input(s): VITAMINB12, FOLATE, FERRITIN, TIBC, IRON, RETICCTPCT in the last 72 hours. Sepsis Labs: No results for input(s): PROCALCITON, LATICACIDVEN in the last 168 hours.  No results found for this or any previous visit (from the past 240 hour(s)).   Radiology Studies: No results found.  Scheduled Meds:  calcium carbonate  1,250 mg Oral TID WC   feeding supplement  237 mL Oral TID BM   magnesium oxide  400 mg Oral Daily   midodrine  5 mg Oral TID WC   mirtazapine  15 mg Oral QHS   multivitamin with minerals  1 tablet Oral Daily   rivaroxaban  10 mg Oral Q supper   senna-docusate  1 tablet Oral BID   Continuous Infusions:  sodium chloride Stopped (06/04/21 1216)     LOS: 56 days   Time spent: 30 minutes. More than 50% of the time was spent in counseling/coordination of care  Lorella Nimrod, MD [facility]  If 7PM-7AM, please contact night-coverage [email or web address]  07/29/2021, 2:38 PM   This record has been created using Systems analyst. Errors have been sought and corrected,but may not always be located. Such creation errors do not reflect on the standard of care.

## 2021-07-29 NOTE — Progress Notes (Signed)
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  Pt recently here for multiple months with difficult d/c planning, did ultimatley d/c to homeless shelter and was gone only a few hours before fall with L ankle and wrist fractures with return to the ED.   OT comments  Ryan [name] put forth good effort today and continues to make progress. He performed grooming and dressing tasks with Mod I-SUPV, engaged in bed mobility with Mod I and required Min A for transfers/standing/ambulation. Pt performed therapeutic activities in sitting, engaging his L UE, demonstrating deficits in fine motor control and complaining of pain with movement, but reports that this is improving from day to day. Will continue to follow POC.    Recommendations for follow up therapy are one component of a multi-disciplinary discharge planning process, led by the attending physician.  Recommendations may be updated based on patient status, additional functional criteria and insurance authorization.    Follow Up Recommendations  SNF    Equipment Recommendations       Recommendations for Other Services      Precautions / Restrictions Precautions Precautions: Fall Precaution Comments: WBAT Spinal Brace Comments: pt no longer using TLSO brace Splint/Cast: pt no longer wearing L LE or L UE splint Restrictions Weight Bearing Restrictions: Yes LUE Weight Bearing: Weight bearing as tolerated LLE Weight Bearing: Weight bearing as tolerated Other Position/Activity Restrictions: splints removed 10/5, can progress to WBAT       Mobility Bed Mobility Overal bed mobility: Independent Bed Mobility: Supine  to Sit;Sit to Supine     Supine to sit: Modified independent (Device/Increase time) Sit to supine: Modified independent (Device/Increase time)   General bed mobility comments: Good control and speed with bed mobility tasks    Transfers Overall transfer level: Needs assistance Equipment used: Rolling walker (2 wheeled) Transfers: Sit to/from Stand Sit to Stand: Min assist;From elevated surface         General transfer comment: Min A with multiple rocking attempts to stand from an elevated surface; fair eccentric control while return to sitting except for the last 6-8" when he demonstrated poor control    Balance Overall balance assessment: Needs assistance Sitting-balance support: No upper extremity supported Sitting balance-Leahy Scale: Good Sitting balance - Comments: Good sitting balance reaching beyond BOS while seated EOB   Standing balance support: Bilateral upper extremity supported;During functional activity Standing balance-Leahy Scale: Fair                             ADL either performed or assessed with clinical judgement   ADL Overall ADL's : Needs assistance/impaired     Grooming: Standing;Oral care;Wash/dry hands;Wash/dry face;Supervision/safety               Lower Body Dressing: Modified independent;Bed level Lower Body Dressing Details (indicate cue type and reason): to don socks                     Vision       Perception     Praxis      Cognition Arousal/Alertness: Awake/alert Behavior During Therapy: WFL for tasks assessed/performed Overall Cognitive Status: Within Functional Limits  for tasks assessed                                 General Comments: Pleasant, talkative, agreeable and eager to participate throughout session        Exercises Other Exercises Other Exercises: Sit to/from stand training from various height surfaces Other Exercises: Dynamic sitting balance training with leaning outside  BOS; ther activities using L UE in sitting Other Exercises: Eccentric sitting training with focus on slow descent and increased trunk flexion   Shoulder Instructions       General Comments      Pertinent Vitals/ Pain       Pain Assessment: No/denies pain Faces Pain Scale: Hurts little more Pain Location: L wrist Pain Descriptors / Indicators: Aching;Sore;Dull Pain Intervention(s): Limited activity within patient's tolerance;Monitored during session;Repositioned  Home Living                                          Prior Functioning/Environment              Frequency  Min 1X/week        Progress Toward Goals  OT Goals(current goals can now be found in the care plan section)  Progress towards OT goals: Progressing toward goals  Acute Rehab OT Goals Patient Stated Goal: to be more independent OT Goal Formulation: With patient Time For Goal Achievement: 08/10/21 Potential to Achieve Goals: Good  Plan Frequency remains appropriate;Discharge plan remains appropriate    Co-evaluation                 AM-PAC OT "6 Clicks" Daily Activity     Outcome Measure   Help from another person eating meals?: None Help from another person taking care of personal grooming?: A Little Help from another person toileting, which includes using toliet, bedpan, or urinal?: A Little Help from another person bathing (including washing, rinsing, drying)?: A Little Help from another person to put on and taking off regular upper body clothing?: A Little Help from another person to put on and taking off regular lower body clothing?: A Little 6 Click Score: 19    End of Session    OT Visit Diagnosis: Unsteadiness on feet (R26.81);Muscle weakness (generalized) (M62.81);History of falling (Z91.81) Pain - Right/Left: Left Pain - part of body: Arm   Activity Tolerance Patient tolerated treatment well   Patient Left in bed;with call bell/phone within reach   Nurse  Communication          Time: 1610-9604 OT Time Calculation (min): 40 min  Charges: OT General Charges $OT Visit: 1 Visit OT Treatments $Self Care/Home Management : 8-22 mins $Therapeutic Activity: 23-37 mins  Ryan Lobo, PhD, MS, OTR/L 07/29/21, 3:20 PM

## 2021-07-29 NOTE — Progress Notes (Signed)
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  INTERVAL HISTORY: Patient remains in the hospital secondary to inability to offer safe discharge.  His performance status is improving and patient states he works with physical therapy regularly and is getting stronger.  He does not complain of pain today.  He has no neurologic complaints.  He denies any recent fevers.  He has no chest pain, shortness of breath, cough, or hemoptysis.  He denies any nausea, vomiting, constipation, or diarrhea.  He has no urinary complaints.  Patient offers no specific complaints today.  REVIEW OF SYSTEMS:   Review of Systems  Constitutional:  Positive for malaise/fatigue. Negative for fever and weight loss.  Respiratory: Negative.  Negative for cough, hemoptysis and shortness of breath.   Cardiovascular: Negative.  Negative for chest pain and leg swelling.  Gastrointestinal: Negative.  Negative for abdominal pain.  Genitourinary: Negative.  Negative for dysuria.  Musculoskeletal: Negative.  Negative for back pain.  Skin: Negative.  Negative for rash.  Neurological:  Positive for weakness. Negative for dizziness, focal weakness and headaches.  Psychiatric/Behavioral: Negative.  The patient is not nervous/anxious.    As per HPI. Otherwise, a complete review of systems is negative.  PAST MEDICAL HISTORY: Past Medical History:  Diagnosis Date   Adenomatous colon polyp    Anemia    Depression    Elevated LFTs     PAST SURGICAL HISTORY: Past Surgical History:  Procedure Laterality Date   FRACTURE SURGERY     INTRAMEDULLARY (IM) NAIL INTERTROCHANTERIC Right 04/02/2020   Procedure: INTRAMEDULLARY (IM) NAIL INTERTROCHANTRIC;  Surgeon: Corky Mull, MD;  Location: [facility] ORS;  Service:  Orthopedics;  Laterality: Right;    FAMILY HISTORY: Family History  Problem Relation Age of Onset   Heart disease Other    Colon polyps Cousin        maternal   Colon polyps Cousin    Colon polyps Maternal Aunt    Colon cancer Maternal Grandfather     ADVANCED DIRECTIVES (Y/N):  @ADVDIR @  HEALTH MAINTENANCE: Social History   Tobacco Use   Smoking status: Some Days   Smokeless tobacco: Never  Substance Use Topics   Alcohol use: Yes    Comment: 4 beers weekly   Drug use: No     Colonoscopy:  PAP:  Bone density:  Lipid panel:  No Known Allergies  Current Facility-Administered Medications  Medication Dose Route Frequency Provider Last Rate Last Admin   0.9 %  sodium chloride infusion   Intravenous PRN Lorella Nimrod, MD   Stopped at 06/04/21 1216   acetaminophen (TYLENOL) 160 MG/5ML solution 650 mg  650 mg Oral Q6H PRN Ivor Costa, MD       calcium carbonate (OS-CAL - dosed in mg of elemental calcium) tablet 1,250 mg  1,250 mg Oral TID WC Ivor Costa, MD   1,250 mg at 07/29/21 1241   feeding supplement (ENSURE ENLIVE / ENSURE PLUS) liquid 237 mL  237 mL Oral TID BM Samuella Cota, MD   237 mL at 07/29/21 1449   hydrOXYzine (ATARAX/VISTARIL) tablet 25 mg  25 mg Oral TID PRN Ivor Costa, MD   25 mg at 07/22/21 2226   magnesium oxide (MAG-OX) tablet 400 mg  400 mg Oral Daily Enzo Bi, MD   400 mg at 07/29/21  1016   midodrine (PROAMATINE) tablet 5 mg  5 mg Oral TID WC Kathie Dike, MD   5 mg at 07/29/21 1241   mirtazapine (REMERON) tablet 15 mg  15 mg Oral QHS Ivor Costa, MD   15 mg at 07/28/21 2204   multivitamin with minerals tablet 1 tablet  1 tablet Oral Daily Lorella Nimrod, MD   1 tablet at 07/29/21 1016   ondansetron (ZOFRAN) injection 4 mg  4 mg Intravenous Q8H PRN Ivor Costa, MD   4 mg at 06/25/21 0556   ondansetron (ZOFRAN-ODT) disintegrating tablet 4 mg  4 mg Oral Q8H PRN Enzo Bi, MD   4 mg at 07/28/21 0849   rivaroxaban (XARELTO) tablet 10 mg  10 mg Oral Q  supper Rauer, Forde Dandy, RPH   10 mg at 07/28/21 1834   senna-docusate (Senokot-S) tablet 1 tablet  1 tablet Oral BID Ralene Muskrat B, MD   1 tablet at 07/29/21 1015   traMADol (ULTRAM) tablet 50 mg  50 mg Oral Q6H PRN Enzo Bi, MD   50 mg at 07/28/21 2204   traZODone (DESYREL) tablet 150 mg  150 mg Oral QHS PRN Jennye Boroughs, MD   150 mg at 07/28/21 2204    OBJECTIVE: Vitals:   07/29/21 0844 07/29/21 1538  BP: (!) 97/58 (!) 116/59  Pulse: 93 95  Resp: 15 15  Temp: (!) 97.5 F (36.4 C) 98.3 F (36.8 C)  SpO2: 97% 97%     Body mass index is 16.55 kg/m.    ECOG FS:1 - Symptomatic but completely ambulatory  General: Well-developed, well-nourished, no acute distress. Eyes: Pink conjunctiva, anicteric sclera. HEENT: Normocephalic, moist mucous membranes. Lungs: No audible wheezing or coughing. Heart: Regular rate and rhythm. Abdomen: Soft, nontender, no obvious distention. Musculoskeletal: No edema, cyanosis, or clubbing. Neuro: Alert, answering all questions appropriately. Cranial nerves grossly intact. Skin: No rashes or petechiae noted. Psych: Normal affect.  LAB RESULTS:  Lab Results  Component Value Date   NA 134 (L) 07/14/2021   K 4.5 07/14/2021   CL 106 07/14/2021   CO2 22 07/14/2021   GLUCOSE 104 (H) 07/14/2021   BUN 11 07/14/2021   CREATININE 0.67 07/14/2021   CALCIUM 8.4 (L) 07/14/2021   PROT 5.7 (L) 06/04/2021   ALBUMIN 3.1 (L) 06/30/2021   AST 16 06/04/2021   ALT 17 06/04/2021   ALKPHOS 1,475 (H) 06/04/2021   BILITOT 0.8 06/04/2021   GFRNONAA >60 07/14/2021   GFRAA >60 04/06/2020    Lab Results  Component Value Date   WBC 6.1 07/24/2021   NEUTROABS 4.1 06/18/2021   HGB 7.9 (L) 07/24/2021   HCT 25.3 (L) 07/24/2021   MCV 81.9 07/24/2021   PLT 215 07/24/2021     STUDIES: DG Wrist 2 Views Left  Result Date: 07/04/2021 CLINICAL DATA:  Follow-up left wrist fracture EXAM: LEFT WRIST - 2 VIEW COMPARISON:  06/02/2021 FINDINGS: Evaluation of fine  bony detail is degraded secondary to overlying casting material. Redemonstrated comminuted minimally displaced fractures involving distal radius and ulna, now with slightly worsened angulation, apex palmar. Expected callus formation about both fracture sites. No additional fractures identified. Expected adjacent soft tissue swelling. No radiopaque foreign body. IMPRESSION: Suspected slightly worsening angulation of known comminuted distal radial and ulnar metaphyseal fractures. Electronically Signed   By: Sandi Mariscal M.D.   On: 07/04/2021 15:29   DG Ankle Complete Left  Result Date: 07/04/2021 CLINICAL DATA:  Follow-up left ankle fracture EXAM: LEFT ANKLE COMPLETE - 3+ VIEW COMPARISON:  06/02/2021; CT the chest, abdomen and pelvis-03/30/2021 FINDINGS: Evaluation of fine bony detail is degraded secondary to overlying casting material. Expected callus formation about known transverse fractures of the distal tibial and fibular metaphyses with slightly worsened impaction and angulation of the distal tibial fracture, apex anterolateral. The distal fibular fracture appears unchanged in alignment. No additional fractures identified. Redemonstrated multiple sclerotic lesions within the calcaneus, talus and several midfoot tarsal bones worrisome for osseous metastatic disease as demonstrated on CT the chest, abdomen pelvis performed 03/30/2021 IMPRESSION: 1. Minimal displacement/impaction of known distal tibial fracture, apex anterolateral. 2. No definitive change in alignment of known distal fibular fracture. 3. Redemonstrated multiple sclerotic lesions within the osseous structures of the foot compatible with known advanced osseous metastatic disease demonstrated on CT the chest, abdomen pelvis performed 03/30/2021. Electronically Signed   By: Sandi Mariscal M.D.   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  PLAN:    Stage IV prostate cancer with widespread bony  disease: Patient's PSA decreased from greater than 3000 down to 184.  Will repeat PSA in the morning.  He received his last treatment for his prostate cancer with IM Lupron 22.5 mg and IV Zometa 4 mg approximately 1 month ago.  Patient's next treatment of Lupron will occur approximately October 01, 2021.  Proceed with monthly Zometa tomorrow.  This has been ordered.  His next infusion of Zometa should occur around August 27, 2021.  No further intervention is needed.  If patient is discharged, he can follow-up as an outpatient. Bony disease: Patient's most recent calcium was adequate to proceed with treatment.  We will repeat basic metabolic panel tomorrow along with PSA.  Proceed with Zometa as above. Anemia: Chronic and unchanged.  Patient's last hemoglobin was reported at 7.9.   Fractures: Improving.  Continue evaluation and treatment per orthopedics. Disposition: No further intervention needed by oncology as inpatient.  Follow-up as above if patient is discharged.  Call with questions.   Lloyd Huger, MD   07/29/2021 4:54 PM

## 2021-07-29 NOTE — Progress Notes (Signed)
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  Pt recently here for multiple months with difficult d/c planning, did ultimatley d/c to homeless shelter and was gone only a few hours before fall with L ankle and wrist fractures with return to the ED.    PT Comments    Pt was pleasant and motivated to participate during the session and overall performed well during the session. Pt anxious regarding attempting transfers/gait without the platform attachment on the RW but with encouragement and education on benefits of using the L hand pt was agreeable.  Pt required assist to stand and demonstrated poor eccentric control during last 6-8" of returning to sitting. Pt was able to amb 60 feet with cuing for upright posture and decreased UE WB.  Pt reported no complaints using the RW without the platform for his LUE. Overall pt is making good progress towards goals and will benefit from HHPT upon discharge to safely address deficits listed in patient problem list for decreased caregiver assistance and eventual return to PLOF.     Recommendations for follow up therapy are one component of a multi-disciplinary discharge planning process, led by the attending physician.  Recommendations may be updated based on patient status, additional functional criteria and insurance authorization.  Follow Up Recommendations  Supervision/Assistance - 24 hour;Home health PT     Equipment Recommendations  Rolling walker with 5" wheels;3in1 (PT)    Recommendations for Other Services       Precautions / Restrictions Precautions Precautions: Fall Restrictions Weight Bearing Restrictions:  Yes LUE Weight Bearing: Weight bearing as tolerated LLE Weight Bearing: Weight bearing as tolerated     Mobility  Bed Mobility Overal bed mobility: Independent             General bed mobility comments: Good control and speed with bed mobility tasks    Transfers Overall transfer level: Needs assistance Equipment used: Rolling walker (2 wheeled) Transfers: Sit to/from Stand Sit to Stand: Min assist;From elevated surface         General transfer comment: Min A with multiple rocking attempts to stand from an elevated surface; fair eccentric control while return to sitting except for the last 6-8" when he demonstrated poor control  Ambulation/Gait Ambulation/Gait assistance: Min guard Gait Distance (Feet): 60 Feet Assistive device: Rolling walker (2 wheeled) Gait Pattern/deviations: Step-through pattern;Trunk flexed;Decreased step length - right;Decreased step length - left Gait velocity: decreased   General Gait Details: Mod verbal cues for amb closer to the RW with upright posture and decreased UE WB on the walker   Stairs             Wheelchair Mobility    Modified Rankin (Stroke Patients Only)       Balance Overall balance assessment: Needs assistance Sitting-balance support: No upper extremity supported Sitting balance-Leahy Scale: Good Sitting balance - Comments: Good sitting balance reaching within BOS while seated EOB   Standing balance support: Bilateral upper extremity supported;During functional activity Standing balance-Leahy Scale: Fair                              Cognition Arousal/Alertness: Awake/alert Behavior During Therapy: [facility]  for tasks assessed/performed Overall Cognitive Status: Within Functional Limits for tasks assessed                                        Exercises Other Exercises Other Exercises: Sit to/from stand training from various height surfaces Other Exercises: Dynamic sitting balance  training with leaning outside BOS Other Exercises: Eccentric sitting training with focus on slow descent and increased trunk flexion    General Comments        Pertinent Vitals/Pain Pain Assessment: No/denies pain    Home Living                      Prior Function            PT Goals (current goals can now be found in the care plan section) Progress towards PT goals: Progressing toward goals    Frequency    Min 2X/week      PT Plan Current plan remains appropriate    Co-evaluation              AM-PAC PT "6 Clicks" Mobility   Outcome Measure  Help needed turning from your back to your side while in a flat bed without using bedrails?: None Help needed moving from lying on your back to sitting on the side of a flat bed without using bedrails?: None Help needed moving to and from a bed to a chair (including a wheelchair)?: A Little Help needed standing up from a chair using your arms (e.g., wheelchair or bedside chair)?: A Little Help needed to walk in hospital room?: A Little Help needed climbing 3-5 steps with a railing? : A Lot 6 Click Score: 19    End of Session Equipment Utilized During Treatment: Gait belt Activity Tolerance: Patient tolerated treatment well Patient left: in bed;with call bell/phone within reach;with bed alarm set Nurse Communication: Mobility status PT Visit Diagnosis: Unsteadiness on feet (R26.81);Muscle weakness (generalized) (M62.81);Difficulty in walking, not elsewhere classified (R26.2);History of falling (Z91.81) Pain - Right/Left: Left Pain - part of body: Hand     Time: 1308-6578 PT Time Calculation (min) (ACUTE ONLY): 23 min  Charges:  $Gait Training: 8-22 mins $Therapeutic Activity: 8-22 mins                    D. Scott Yoshi Mancillas PT, DPT 07/29/21, 11:56 AM

## 2021-07-30 LAB — BASIC METABOLIC PANEL
Anion gap: 5 (ref 5–15)
BUN: 16 mg/dL (ref 6–20)
CO2: 21 mmol/L — ABNORMAL LOW (ref 22–32)
Calcium: 8.4 mg/dL — ABNORMAL LOW (ref 8.9–10.3)
Chloride: 106 mmol/L (ref 98–111)
Creatinine, Ser: 0.73 mg/dL (ref 0.61–1.24)
GFR, Estimated: >60 mL/min (ref >60)
Glucose, Bld: 94 mg/dL (ref 70–99)
Potassium: 5 mmol/L (ref 3.5–5.1)
Sodium: 132 mmol/L — ABNORMAL LOW (ref 135–145)

## 2021-07-30 LAB — PSA: Prostatic Specific Antigen: 1199 ng/mL — ABNORMAL HIGH (ref 0.00–4.00)

## 2021-07-30 NOTE — Progress Notes (Signed)
PROGRESS NOTE    Ryan [name]  YKD:[id number] DOB: 1960/11/25 DOA: 06/02/2021 PCP: Pcp, No   Brief Narrative: Taken from prior notes. 60 year old man PMH metastatic prostate cancer presented with a fall resulting in closed left ankle fracture and left wrist fracture.  This was treated with conservative management.  Hospitalization uncomplicated.  Discharge delayed by difficulty with disposition.  Remains medically stable for discharge.  Accepting facility has some concern about the start date of his disability.  Subjective: Patient was resting comfortably when seen today.  No new complaints.  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  Secondary to fall.  Managed conservatively by orthopedic surgery.  Splint has been removed.  Able to work with PT and weightbearing. -Outpatient orthopedic follow-up in 2 weeks.  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  Being followed up by oncology as an outpatient.  Patient received his Zometa today, next dose will be on November 18. Getting Lupron every 60-month, prior dose was approximately a month ago.  Cancer cachexia/protein caloric malnutrition, severe. -Encourage p.o. intake with supplements.  Anemia of chronic disease. -Stable  Objective: Vitals:   07/30/21 0537 07/30/21 0757 07/30/21 1152 07/30/21 1445  BP: (!) 102/51 (!) 104/51 (!) 105/51 (!) 100/53  Pulse: 95 88 87 89  Resp: 16 18 17 15   Temp: (!) 97.4 F (36.3 C) 98.2 F (36.8 C) 98 F (36.7 C) 97.6 F (36.4 C)  TempSrc:      SpO2: 98% 97% 96% 98%  Weight:      Height:         Intake/Output Summary (Last 24 hours) at 07/30/2021 1658 Last data filed at 07/30/2021 1024 Gross per 24 hour  Intake 237 ml  Output --  Net 237 ml    Filed Weights   07/06/21 1406 07/07/21 1443 07/27/21 0500  Weight: 65.6 kg 62.5 kg 63.3 kg    Examination:  General.  Emaciated gentleman, in no acute distress. Pulmonary.  Lungs clear bilaterally, normal respiratory effort. CV.  Regular rate and rhythm, no JVD, rub or murmur. Abdomen.  Soft, nontender, nondistended, BS positive. CNS.  Alert and oriented .  No focal neurologic deficit. Extremities.  No edema, no cyanosis, pulses intact and symmetrical. Psychiatry.  Judgment and insight appears normal.   DVT prophylaxis: Xarelto Code Status: DNR Family Communication:  Disposition Plan:  Status is: Inpatient  Remains inpatient appropriate because: Unsafe discharge   level of care: Med-Surg  All the records are reviewed and case discussed with Care Management/Social Worker. Management plans discussed with the patient, nursing and they are in agreement.  Consultants:  Orthopedic surgery Oncology:  Procedures:  Antimicrobials:   Data Reviewed: I have personally reviewed following labs and imaging studies  CBC: Recent Labs  Lab 07/24/21 0433  WBC 6.1  HGB 7.9*  HCT 25.3*  MCV 81.9  PLT 397    Basic Metabolic Panel: Recent Labs  Lab 07/30/21 0346  NA 132*  K 5.0  CL 106  CO2 21*  GLUCOSE 94  BUN 16  CREATININE 0.73  CALCIUM 8.4*  GFR: Estimated Creatinine Clearance: 89 mL/min (by C-G formula based on SCr of 0.73 mg/dL). Liver Function Tests: No results for input(s): AST, ALT, ALKPHOS, BILITOT, PROT, ALBUMIN in the last 168 hours. No results for input(s): LIPASE, AMYLASE in the last 168 hours. No results for input(s): AMMONIA in the last 168 hours. Coagulation Profile: No results for input(s): INR, PROTIME in the last 168 hours. Cardiac Enzymes: No results for input(s): CKTOTAL, CKMB,  CKMBINDEX, TROPONINI in the last 168 hours. BNP (last 3 results) No results for input(s): PROBNP in the last 8760 hours. HbA1C: No results for input(s): HGBA1C in the last 72 hours. CBG: No results for input(s): GLUCAP in the last 168 hours. Lipid Profile: No results for input(s): CHOL, HDL, LDLCALC, TRIG, CHOLHDL, LDLDIRECT in the last 72 hours. Thyroid Function Tests: No results for input(s): TSH, T4TOTAL, FREET4, T3FREE, THYROIDAB in the last 72 hours. Anemia Panel: No results for input(s): VITAMINB12, FOLATE, FERRITIN, TIBC, IRON, RETICCTPCT in the last 72 hours. Sepsis Labs: No results for input(s): PROCALCITON, LATICACIDVEN in the last 168 hours.  No results found for this or any previous visit (from the past 240 hour(s)).   Radiology Studies: No results found.  Scheduled Meds:  calcium carbonate  1,250 mg Oral TID WC   feeding supplement  237 mL Oral TID BM   magnesium oxide  400 mg Oral Daily   midodrine  5 mg Oral TID WC   mirtazapine  15 mg Oral QHS   multivitamin with minerals  1 tablet Oral Daily   rivaroxaban  10 mg Oral Q supper   senna-docusate  1 tablet Oral BID   Continuous Infusions:  sodium chloride Stopped (06/04/21 1216)     LOS: 57 days   Time spent: 28 minutes. More than 50% of the time was spent in counseling/coordination of care  Lorella Nimrod, MD [facility]  If 7PM-7AM, please contact night-coverage [email or web address]  07/30/2021, 4:58 PM   This record has been created using Systems analyst. Errors have been sought and corrected,but may not always be located. Such creation errors do not reflect on the standard of care.

## 2021-07-30 NOTE — Plan of Care (Signed)
  Problem: Education: Goal: Knowledge of General Education information will improve Description: Including pain rating scale, medication(s)/side effects and non-pharmacologic comfort measures Outcome: Progressing   Problem: Health Behavior/Discharge Planning: Goal: Ability to manage health-related needs will improve Outcome: Progressing   Problem: Clinical Measurements: Goal: Ability to maintain clinical measurements within normal limits will improve Outcome: Progressing Goal: Will remain free from infection Outcome: Progressing Goal: Diagnostic test results will improve Outcome: Progressing Goal: Respiratory complications will improve Outcome: Progressing Goal: Cardiovascular complication will be avoided Outcome: Progressing   Problem: Activity: Goal: Risk for activity intolerance will decrease Outcome: Progressing   Problem: Nutrition: Goal: Adequate nutrition will be maintained Outcome: Progressing   Problem: Coping: Goal: Level of anxiety will decrease Outcome: Progressing   Problem: Elimination: Goal: Will not experience complications related to bowel motility Outcome: Progressing Goal: Will not experience complications related to urinary retention Outcome: Progressing   Problem: Pain Managment: Goal: General experience of comfort will improve Outcome: Progressing   Problem: Safety: Goal: Ability to remain free from injury will improve Outcome: Progressing   Problem: Skin Integrity: Goal: Risk for impaired skin integrity will decrease Outcome: Progressing   Problem: Education: Goal: Knowledge of the prescribed therapeutic regimen will improve Outcome: Progressing   Problem: Activity: Goal: Ability to increase mobility will improve Outcome: Progressing   Problem: Pain Management: Goal: Pain level will decrease with appropriate interventions Outcome: Progressing

## 2021-07-31 NOTE — Progress Notes (Signed)
PROGRESS NOTE    Ryan [name]  RSW:[id number] DOB: Nov 22, 1960 DOA: 06/02/2021 PCP: Pcp, No   Brief Narrative: Taken from prior notes. 60 year old man PMH metastatic prostate cancer presented with a fall resulting in closed left ankle fracture and left wrist fracture.  This was treated with conservative management.  Hospitalization uncomplicated.  Discharge delayed by difficulty with disposition.  Remains medically stable for discharge.  Accepting facility has some concern about the start date of his disability.  Patient received dose of Zometa on 07/30/2021.  Oncology repeated the PSA which seems rising at 1199 from 184.  Subjective: Patient was having some mild indigestion after eating the breakfast.  Denies any nausea or vomiting.  We discussed about his new PSA and he seems very disappointing.  He wants to talk with his oncologist about the next plan.  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  Secondary to fall.  Managed conservatively by orthopedic surgery.  Splint has been removed.  Able to work with PT and weightbearing. -Outpatient orthopedic follow-up in 2 weeks.  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  Being followed up by oncology as an outpatient.  Patient received his Zometa today, next dose will be on November 18. Getting Lupron every 26-month, prior dose was approximately a month ago. Repeat PSA done yesterday by oncology with significant rise to 1199 from 184 a month ago. We will asked [name] to talk with patient on Monday as he wants to discuss further  plan.  Cancer cachexia/protein caloric malnutrition, severe. -Encourage p.o. intake with supplements.  Anemia of chronic disease. -Stable  Objective: Vitals:   07/30/21 1445 07/30/21 2052 07/30/21 2351 07/31/21 0802  BP: (!) 100/53 (!) 108/58 (!) 98/58 (!) 105/52  Pulse: 89 92 87 96  Resp: 15 16 17 16   Temp: 97.6 F (36.4 C) 97.8 F (36.6 C) (!) 97.5 F (36.4 C) 98.1 F (36.7 C)  TempSrc:  Oral    SpO2: 98% 100% 99% 96%  Weight:      Height:        Intake/Output Summary (Last 24 hours) at 07/31/2021 1414 Last data filed at 07/31/2021 1243 Gross per 24 hour  Intake 84 ml  Output 1300 ml  Net -1216 ml    Filed Weights   07/06/21 1406 07/07/21 1443 07/27/21 0500  Weight: 65.6 kg 62.5 kg 63.3 kg    Examination:  General.  Malnourished gentleman, in no acute distress. Pulmonary.  Lungs clear bilaterally, normal respiratory effort. CV.  Regular rate and rhythm, no JVD, rub or murmur. Abdomen.  Soft, nontender, nondistended, BS positive. CNS.  Alert and oriented .  No focal neurologic deficit. Extremities.  No edema, no cyanosis, pulses intact and symmetrical. Psychiatry.  Judgment and insight appears normal.   DVT prophylaxis: Xarelto Code Status: DNR Family Communication:  Disposition Plan:  Status is: Inpatient  Remains inpatient appropriate because: Unsafe discharge   level of care: Med-Surg  All the records are reviewed and case discussed with Care Management/Social Worker. Management plans discussed with the patient, nursing and they are in agreement.  Consultants:  Orthopedic surgery Oncology:  Procedures:  Antimicrobials:   Data Reviewed: I have personally reviewed following labs and imaging studies  CBC: No results for input(s): WBC, NEUTROABS, HGB, HCT, MCV, PLT in the last 168 hours.  Basic Metabolic Panel: Recent Labs  Lab 07/30/21 0346  NA 132*  K 5.0  CL 106  CO2 21*  GLUCOSE 94  BUN 16  CREATININE 0.73  CALCIUM 8.4*     GFR: Estimated Creatinine Clearance: 89 mL/min (by C-G formula based on SCr of 0.73 mg/dL). Liver Function Tests: No results for input(s): AST, ALT, ALKPHOS, BILITOT, PROT, ALBUMIN in the last 168 hours. No results for input(s): LIPASE, AMYLASE in the last 168 hours. No results for input(s): AMMONIA in the last 168 hours. Coagulation Profile: No results for input(s): INR, PROTIME in the last 168 hours. Cardiac Enzymes: No results for input(s): CKTOTAL, CKMB, CKMBINDEX, TROPONINI in the last 168 hours. BNP (last 3 results) No results for input(s): PROBNP in the last 8760 hours. HbA1C: No results for input(s): HGBA1C in the last 72 hours. CBG: No results for input(s): GLUCAP in the last 168 hours. Lipid Profile: No results for input(s): CHOL, HDL, LDLCALC, TRIG, CHOLHDL, LDLDIRECT in the last 72 hours. Thyroid Function Tests: No results for input(s): TSH, T4TOTAL, FREET4, T3FREE, THYROIDAB in the last 72 hours. Anemia Panel: No results for input(s): VITAMINB12, FOLATE, FERRITIN, TIBC, IRON, RETICCTPCT in the last 72 hours. Sepsis Labs: No results for input(s): PROCALCITON, LATICACIDVEN in the last 168 hours.  No results found for this or any previous visit (from the past 240 hour(s)).   Radiology Studies: No results found.  Scheduled Meds:  calcium carbonate  1,250 mg Oral TID WC   feeding supplement  237 mL Oral TID BM   magnesium oxide  400 mg Oral Daily   midodrine  5 mg Oral TID WC   mirtazapine  15 mg Oral QHS   multivitamin with minerals  1 tablet Oral Daily   rivaroxaban  10 mg Oral Q supper   senna-docusate  1 tablet Oral BID   Continuous Infusions:  sodium chloride Stopped (06/04/21 1216)     LOS: 58 days   Time spent: 30 minutes. More than 50% of the time was spent in counseling/coordination of care  Lorella Nimrod, MD [facility]  If 7PM-7AM, please contact night-coverage [email or web address]  07/31/2021, 2:14 PM   This record has been created  using Systems analyst. Errors have been sought and corrected,but may not always be located. Such creation errors do not reflect on the standard of care.

## 2021-07-31 NOTE — Plan of Care (Signed)
  Problem: Education: Goal: Knowledge of General Education information will improve Description: Including pain rating scale, medication(s)/side effects and non-pharmacologic comfort measures Outcome: Progressing   Problem: Health Behavior/Discharge Planning: Goal: Ability to manage health-related needs will improve Outcome: Progressing   Problem: Clinical Measurements: Goal: Ability to maintain clinical measurements within normal limits will improve Outcome: Progressing Goal: Will remain free from infection Outcome: Progressing Goal: Diagnostic test results will improve Outcome: Progressing Goal: Respiratory complications will improve Outcome: Progressing Goal: Cardiovascular complication will be avoided Outcome: Progressing   Problem: Activity: Goal: Risk for activity intolerance will decrease Outcome: Progressing   Problem: Nutrition: Goal: Adequate nutrition will be maintained Outcome: Progressing   Problem: Coping: Goal: Level of anxiety will decrease Outcome: Progressing   Problem: Elimination: Goal: Will not experience complications related to bowel motility Outcome: Progressing Goal: Will not experience complications related to urinary retention Outcome: Progressing   Problem: Pain Managment: Goal: General experience of comfort will improve Outcome: Progressing   Problem: Safety: Goal: Ability to remain free from injury will improve Outcome: Progressing   Problem: Skin Integrity: Goal: Risk for impaired skin integrity will decrease Outcome: Progressing   Problem: Education: Goal: Knowledge of the prescribed therapeutic regimen will improve Outcome: Progressing   Problem: Activity: Goal: Ability to increase mobility will improve Outcome: Progressing   Problem: Pain Management: Goal: Pain level will decrease with appropriate interventions Outcome: Progressing

## 2021-08-01 NOTE — Progress Notes (Signed)
PROGRESS NOTE    Ryan [name]  XYI:[id number] DOB: 02-Oct-1961 DOA: 06/02/2021 PCP: Pcp, No   Brief Narrative: Taken from prior notes. 60 year old man PMH metastatic prostate cancer presented with a fall resulting in closed left ankle fracture and left wrist fracture.  This was treated with conservative management.  Hospitalization uncomplicated.  Discharge delayed by difficulty with disposition.  Remains medically stable for discharge.  Accepting facility has some concern about the start date of his disability.  Patient received dose of Zometa on 07/30/2021.  Oncology repeated the PSA which seems rising at 1199 from 184. Patient wants to talk with his oncologist, we will contact [name] tomorrow.  Subjective: Patient was seen and examined today.  No new complaints.  He was asking whether I was able to reach [name], explained to him that he is not on-call this weekend so I will give him a call tomorrow.  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  Secondary to fall.  Managed conservatively by orthopedic surgery.  Splint has been removed.  Able to work with PT and weightbearing. -Outpatient orthopedic follow-up in 2 weeks.  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  Being followed up by oncology as an outpatient.  Patient received his Zometa today, next dose will be on November 18. Getting Lupron every 27-month, prior dose was approximately a month ago. Repeat PSA done yesterday by oncology with significant rise to 1199 from 184 a month ago. We will asked [name] to talk  with patient on Monday as he wants to discuss further plan.  Cancer cachexia/protein caloric malnutrition, severe. -Encourage p.o. intake with supplements.  Anemia of chronic disease. -Stable  Objective: Vitals:   07/31/21 0802 07/31/21 1545 07/31/21 2120 08/01/21 0354  BP: (!) 105/52 (!) 113/54 (!) 106/55 (!) 100/53  Pulse: 96 95 86 93  Resp: 16 16 20 17   Temp: 98.1 F (36.7 C) 98.3 F (36.8 C) 97.8 F (36.6 C) 98.3 F (36.8 C)  TempSrc:    Oral  SpO2: 96% 98% 98% 96%  Weight:      Height:        Intake/Output Summary (Last 24 hours) at 08/01/2021 0839 Last data filed at 08/01/2021 0100 Gross per 24 hour  Intake --  Output 855 ml  Net -855 ml    Filed Weights   07/06/21 1406 07/07/21 1443 07/27/21 0500  Weight: 65.6 kg 62.5 kg 63.3 kg    Examination:  General.  Malnourished gentleman, in no acute distress. Pulmonary.  Lungs clear bilaterally, normal respiratory effort. CV.  Regular rate and rhythm, no JVD, rub or murmur. Abdomen.  Soft, nontender, nondistended, BS positive. CNS.  Alert and oriented x3.  No focal neurologic deficit. Extremities.  No edema, no cyanosis, pulses intact and symmetrical. Psychiatry.  Judgment and insight appears normal.   DVT prophylaxis: Xarelto Code Status: DNR Family Communication:  Disposition Plan:  Status is: Inpatient  Remains inpatient appropriate because: Unsafe discharge   level of care: Med-Surg  All the records are reviewed and case discussed with Care Management/Social Worker. Management  plans discussed with the patient, nursing and they are in agreement.  Consultants:  Orthopedic surgery Oncology:  Procedures:  Antimicrobials:   Data Reviewed: I have personally reviewed following labs and imaging studies  CBC: No results for input(s): WBC, NEUTROABS, HGB, HCT, MCV, PLT in the last 168 hours.  Basic Metabolic Panel: Recent Labs  Lab 07/30/21 0346  NA 132*  K 5.0  CL 106  CO2 21*  GLUCOSE 94   BUN 16  CREATININE 0.73  CALCIUM 8.4*    GFR: Estimated Creatinine Clearance: 89 mL/min (by C-G formula based on SCr of 0.73 mg/dL). Liver Function Tests: No results for input(s): AST, ALT, ALKPHOS, BILITOT, PROT, ALBUMIN in the last 168 hours. No results for input(s): LIPASE, AMYLASE in the last 168 hours. No results for input(s): AMMONIA in the last 168 hours. Coagulation Profile: No results for input(s): INR, PROTIME in the last 168 hours. Cardiac Enzymes: No results for input(s): CKTOTAL, CKMB, CKMBINDEX, TROPONINI in the last 168 hours. BNP (last 3 results) No results for input(s): PROBNP in the last 8760 hours. HbA1C: No results for input(s): HGBA1C in the last 72 hours. CBG: No results for input(s): GLUCAP in the last 168 hours. Lipid Profile: No results for input(s): CHOL, HDL, LDLCALC, TRIG, CHOLHDL, LDLDIRECT in the last 72 hours. Thyroid Function Tests: No results for input(s): TSH, T4TOTAL, FREET4, T3FREE, THYROIDAB in the last 72 hours. Anemia Panel: No results for input(s): VITAMINB12, FOLATE, FERRITIN, TIBC, IRON, RETICCTPCT in the last 72 hours. Sepsis Labs: No results for input(s): PROCALCITON, LATICACIDVEN in the last 168 hours.  No results found for this or any previous visit (from the past 240 hour(s)).   Radiology Studies: No results found.  Scheduled Meds:  calcium carbonate  1,250 mg Oral TID WC   feeding supplement  237 mL Oral TID BM   magnesium oxide  400 mg Oral Daily   midodrine  5 mg Oral TID WC   mirtazapine  15 mg Oral QHS   multivitamin with minerals  1 tablet Oral Daily   rivaroxaban  10 mg Oral Q supper   senna-docusate  1 tablet Oral BID   Continuous Infusions:  sodium chloride Stopped (06/04/21 1216)     LOS: 59 days   Time spent: 30 minutes. More than 50% of the time was spent in counseling/coordination of care  Lorella Nimrod, MD [facility]  If 7PM-7AM, please contact night-coverage [email or web address]  08/01/2021,  8:39 AM   This record has been created using Systems analyst. Errors have been sought and corrected,but may not always be located. Such creation errors do not reflect on the standard of care.

## 2021-08-02 NOTE — Progress Notes (Signed)
PT Cancellation Note  Patient Details Name: Ryan [name] MRN: [id number] DOB: 1961-07-20   Cancelled Treatment:    Reason Eval/Treat Not Completed: Medical issues which prohibited therapy  Pt in bed with c/o general malaise from receiving Cancer meds yesterday.  Confirmed I chart review.  Requested to wait until tomorrow to walk. Asked what time tomorrow would be best for him and he stated after lunch.  Will really to therapy team tomorrow and will accommodate as able.    [name] 08/02/2021, 3:06 PM

## 2021-08-02 NOTE — Progress Notes (Signed)
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  Pt recently here for multiple months with difficult d/c planning, did ultimatley d/c to homeless shelter and was gone only a few hours before fall with L ankle and wrist fractures with return to the ED.   OT comments  Ryan [name] made good progress today; despite reporting that he was feeling "down" (after learning about disappointing lab result) and "tired" (following chemotherapy), he agreed to participate in therapy and performed grooming, bathing, and dressing tasks in sitting, while focusing on using his L UE to complete as much of the work as possible. Worked on standing balance, able to maintain fair/good standing balance w/ RW for ~ 4 minutes. Transferred to recliner, with call bell within reach.    Recommendations for follow up therapy are one component of a multi-disciplinary discharge planning process, led by the attending physician.  Recommendations may be updated based on patient status, additional functional criteria and insurance authorization.    Follow Up Recommendations  Skilled nursing-short term rehab (<3 hours/day)    Assistance Recommended at Discharge Frequent or constant Supervision/Assistance  Equipment Recommendations       Recommendations for Other Services      Precautions / Restrictions Precautions Precautions: Fall Spinal Brace Comments: pt no longer using TLSO brace Splint/Cast: pt no longer wearing L LE or L UE splint Other Brace: pt no longer using LLE post-op shoe Restrictions Weight Bearing Restrictions: No RUE Weight Bearing: Weight bearing as tolerated LUE Weight Bearing: Weight  bearing as tolerated LLE Weight Bearing: Weight bearing as tolerated Other Position/Activity Restrictions: splints removed 10/5, can progress to WBAT       Mobility Bed Mobility Overal bed mobility: Independent Bed Mobility: Supine to Sit     Supine to sit: Modified independent (Device/Increase time)     General bed mobility comments: Good control and speed with bed mobility tasks    Transfers Overall transfer level: Needs assistance Equipment used: Rolling walker (2 wheels) Transfers: Sit to/from [facility] Sit to Stand: Min assist;From elevated surface Stand pivot transfers: Min guard               Balance Overall balance assessment: Needs assistance Sitting-balance support: No upper extremity supported Sitting balance-Leahy Scale: Good Sitting balance - Comments: Good sitting balance while engaged in fxl tasks seated EOB   Standing balance support: Bilateral upper extremity supported;During functional activity Standing balance-Leahy Scale: Fair Standing balance comment: Slight dizziness in standing but able to maintain fair/good posture, good eccentric control lowering self into recliner                           ADL either performed or assessed with clinical judgement   ADL Overall ADL's : Needs assistance/impaired Eating/Feeding: Independent;Sitting   Grooming: Sitting;Wash/dry face;Wash/dry hands;Brushing hair;Set up;Supervision/safety Grooming Details (indicate cue type and reason): EOB sitting for shampooing, UB bathing, grooming, brushing hair, w/ extensive use of L UE Upper Body Bathing: Supervision/ safety;Sitting           Lower Body Dressing: Modified independent;Sitting/lateral leans Lower Body Dressing Details (indicate cue type and reason): to don socks  Vision       Perception     Praxis      Cognition Arousal/Alertness: Awake/alert Behavior During Therapy: WFL for tasks  assessed/performed Overall Cognitive Status: Within Functional Limits for tasks assessed                                 General Comments: Pleasant, talkative, agreeable and eager to participate throughout session          Exercises Other Exercises Other Exercises: Dynamic sitting balance tolerance; ADL training in sitting using L UE   Shoulder Instructions       General Comments      Pertinent Vitals/ Pain       Pain Assessment: No/denies pain  Home Living                                          Prior Functioning/Environment              Frequency  Min 1X/week        Progress Toward Goals  OT Goals(current goals can now be found in the care plan section)  Progress towards OT goals: Progressing toward goals  Acute Rehab OT Goals OT Goal Formulation: With patient Time For Goal Achievement: 08/10/21 Potential to Achieve Goals: Good  Plan Frequency remains appropriate;Discharge plan remains appropriate    Co-evaluation                 AM-PAC OT "6 Clicks" Daily Activity     Outcome Measure   Help from another person eating meals?: None Help from another person taking care of personal grooming?: A Little Help from another person toileting, which includes using toliet, bedpan, or urinal?: A Little Help from another person bathing (including washing, rinsing, drying)?: A Little Help from another person to put on and taking off regular upper body clothing?: A Little Help from another person to put on and taking off regular lower body clothing?: A Little 6 Click Score: 19    End of Session Equipment Utilized During Treatment: Rolling walker (2 wheels)  OT Visit Diagnosis: Unsteadiness on feet (R26.81);Muscle weakness (generalized) (M62.81);History of falling (Z91.81);Adult, failure to thrive (R62.7)   Activity Tolerance Patient tolerated treatment well   Patient Left in chair;with call bell/phone within reach    Nurse Communication          Time: 4496-7591 OT Time Calculation (min): 24 min  Charges: OT General Charges $OT Visit: 1 Visit OT Treatments $Self Care/Home Management : 23-37 mins  Josiah Lobo, PhD, MS, OTR/L 08/02/21, 4:40 PM

## 2021-08-02 NOTE — Plan of Care (Signed)
  Problem: Education: Goal: Knowledge of General Education information will improve Description: Including pain rating scale, medication(s)/side effects and non-pharmacologic comfort measures Outcome: Progressing   Problem: Pain Managment: Goal: General experience of comfort will improve Outcome: Progressing   Problem: Safety: Goal: Ability to remain free from injury will improve Outcome: Progressing   Problem: Skin Integrity: Goal: Risk for impaired skin integrity will decrease Outcome: Progressing   Problem: Education: Goal: Knowledge of the prescribed therapeutic regimen will improve Outcome: Progressing   Problem: Activity: Goal: Ability to increase mobility will improve Outcome: Progressing   Problem: Pain Management: Goal: Pain level will decrease with appropriate interventions Outcome: Progressing

## 2021-08-02 NOTE — Progress Notes (Signed)
PROGRESS NOTE    Ryan [name]  PYK:[id number] DOB: 05-22-1961 DOA: 06/02/2021 PCP: Pcp, No   Brief Narrative: Taken from prior notes. 60 year old man PMH metastatic prostate cancer presented with a fall resulting in closed left ankle fracture and left wrist fracture.  This was treated with conservative management.  Hospitalization uncomplicated.  Discharge delayed by difficulty with disposition.  Remains medically stable for discharge.  Accepting facility has some concern about the start date of his disability.  Patient received dose of Zometa on 07/30/2021.  Oncology repeated the PSA which seems rising at 1199 from 184. Patient wants to talk with his oncologist, talk with Ryan [name], he will try to see him soon.  Subjective: Patient was having some left wrist pain when seen today.  No other complaints.  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  Secondary to fall.  Managed conservatively by orthopedic surgery.  Splint has been removed.  Able to work with PT and weightbearing. -Outpatient orthopedic follow-up in 2 weeks.  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  Being followed up by oncology as an outpatient.  Patient received his Zometa today, next dose will be on November 18. Getting Lupron every 48-month, prior dose was approximately a month ago. Repeat PSA done yesterday by oncology with significant rise to 1199 from 184 a month ago. Talked with Ryan [name] and he will try to see him soon.  Cancer cachexia/protein caloric malnutrition, severe. -Encourage p.o. intake with  supplements.  Anemia of chronic disease. -Stable  Objective: Vitals:   08/01/21 2120 08/02/21 0324 08/02/21 0747 08/02/21 1255  BP: (!) 108/57 105/62 (!) 100/53 (!) 102/50  Pulse: 89 90 93 88  Resp: 17 18 16 16   Temp: 98 F (36.7 C) 98.3 F (36.8 C) 97.8 F (36.6 C) 98.3 F (36.8 C)  TempSrc: Oral Oral    SpO2: 97% 97% 95% 94%  Weight:      Height:        Intake/Output Summary (Last 24 hours) at 08/02/2021 1639 Last data filed at 08/02/2021 1257 Gross per 24 hour  Intake 240 ml  Output 350 ml  Net -110 ml    Filed Weights   07/06/21 1406 07/07/21 1443 07/27/21 0500  Weight: 65.6 kg 62.5 kg 63.3 kg    Examination:  General.  Emaciated gentleman, in no acute distress. Pulmonary.  Lungs clear bilaterally, normal respiratory effort. CV.  Regular rate and rhythm, no JVD, rub or murmur. Abdomen.  Soft, nontender, nondistended, BS positive. CNS.  Alert and oriented x3.  No focal neurologic deficit. Extremities.  No edema, no cyanosis, pulses intact and symmetrical. Psychiatry.  Judgment and insight appears normal.   DVT prophylaxis: Xarelto Code Status: DNR Family Communication:  Disposition Plan:  Status is: Inpatient  Remains inpatient appropriate because: Unsafe discharge   level of care: Med-Surg  All the records are reviewed and case discussed with Care Management/Social Worker. Management plans discussed with the patient, nursing and they are in agreement.  Consultants:  Orthopedic surgery Oncology:  Procedures:  Antimicrobials:   Data Reviewed: I have  personally reviewed following labs and imaging studies  CBC: No results for input(s): WBC, NEUTROABS, HGB, HCT, MCV, PLT in the last 168 hours.  Basic Metabolic Panel: Recent Labs  Lab 07/30/21 0346  NA 132*  K 5.0  CL 106  CO2 21*  GLUCOSE 94  BUN 16  CREATININE 0.73  CALCIUM 8.4*    GFR: Estimated Creatinine Clearance: 89 mL/min (by C-G formula based on SCr of 0.73 mg/dL). Liver  Function Tests: No results for input(s): AST, ALT, ALKPHOS, BILITOT, PROT, ALBUMIN in the last 168 hours. No results for input(s): LIPASE, AMYLASE in the last 168 hours. No results for input(s): AMMONIA in the last 168 hours. Coagulation Profile: No results for input(s): INR, PROTIME in the last 168 hours. Cardiac Enzymes: No results for input(s): CKTOTAL, CKMB, CKMBINDEX, TROPONINI in the last 168 hours. BNP (last 3 results) No results for input(s): PROBNP in the last 8760 hours. HbA1C: No results for input(s): HGBA1C in the last 72 hours. CBG: No results for input(s): GLUCAP in the last 168 hours. Lipid Profile: No results for input(s): CHOL, HDL, LDLCALC, TRIG, CHOLHDL, LDLDIRECT in the last 72 hours. Thyroid Function Tests: No results for input(s): TSH, T4TOTAL, FREET4, T3FREE, THYROIDAB in the last 72 hours. Anemia Panel: No results for input(s): VITAMINB12, FOLATE, FERRITIN, TIBC, IRON, RETICCTPCT in the last 72 hours. Sepsis Labs: No results for input(s): PROCALCITON, LATICACIDVEN in the last 168 hours.  No results found for this or any previous visit (from the past 240 hour(s)).   Radiology Studies: No results found.  Scheduled Meds:  calcium carbonate  1,250 mg Oral TID WC   feeding supplement  237 mL Oral TID BM   magnesium oxide  400 mg Oral Daily   midodrine  5 mg Oral TID WC   mirtazapine  15 mg Oral QHS   multivitamin with minerals  1 tablet Oral Daily   rivaroxaban  10 mg Oral Q supper   senna-docusate  1 tablet Oral BID   Continuous Infusions:  sodium chloride Stopped (06/04/21 1216)     LOS: 60 days   Time spent: 30 minutes. More than 50% of the time was spent in counseling/coordination of care  Ryan Nimrod, MD [facility]  If 7PM-7AM, please contact night-coverage [email or web address]  08/02/2021, 4:39 PM   This record has been created using Systems analyst. Errors have been sought and corrected,but may not always be located.  Such creation errors do not reflect on the standard of care.

## 2021-08-03 LAB — PSA: Prostatic Specific Antigen: 1217 ng/mL — ABNORMAL HIGH (ref 0.00–4.00)

## 2021-08-03 NOTE — Progress Notes (Signed)
Nutrition Follow-up  DOCUMENTATION CODES:  Severe malnutrition in context of chronic illness, Underweight  INTERVENTION:  Continue current diet as ordered Continue Ensure Enlive po TID, each supplement provides 350 kcal and 20 grams of protein Continue MVI with minerals daily Daily weights  NUTRITION DIAGNOSIS:  Severe Malnutrition related to chronic illness, cancer and cancer related treatments as evidenced by severe fat depletion, severe muscle depletion.  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 Recently admitted 6/21-8/24. Discharged to homeless shelter then returned to ED a few hours later after a fall and pain to his wrist.  Stable for dc, case management team working on placement. Limited meal intake over the last week, but appears stable. Continues to refuse most ensure supplements. Pt not compliant with nutrition plan. No new weights since 10/18 despite being ordered for daily. Will request again to assess for loss.   Average Meal Intake: 8/27-8/29: 72% intake x 2 recorded meals (50-94%) 8/30-9/7: 63% intake x 2 recorded meals (25-100%) 9/8-9/14: 50% intake x 3 recorded meals (50%) 9/15-9/20: 35% intake x 2 recorded meals (20-50%) 9/21-9/27: 50% intake x 3 recorded meals (0-100%) 9/28-10/4: limited meals recorded 10/5-10/10: 100% intake x 1 recorded meals 10/11-10/18: no meals recorded 10/19-10/25: 55% intake x 5 recorded meals  Nutritionally Relevant Medications: Scheduled Meds:  calcium carbonate  1,250 mg Oral TID WC   feeding supplement - Ensure Enlive  237 mL Oral TID BM   magnesium oxide  400 mg Oral Daily   mirtazapine  15 mg Oral QHS   multivitamin with minerals  1 tablet  Oral Daily   senna-docusate  1 tablet Oral BID   PRN Meds: ondansetron  Labs Reviewed  NUTRITION - FOCUSED PHYSICAL EXAM: Flowsheet Row Most Recent Value  Orbital Region Mild depletion  Upper Arm Region Severe depletion  Thoracic and Lumbar Region Severe depletion  Buccal Region Mild depletion  Temple Region Moderate depletion  Clavicle Bone Region Severe depletion  Clavicle and Acromion Bone Region Severe depletion  Scapular Bone Region Severe depletion  Dorsal Hand Severe depletion  Patellar Region Severe depletion  Anterior Thigh Region Severe depletion  Posterior Calf Region Severe depletion  Edema (RD Assessment) None  Hair Reviewed  Eyes Reviewed  Mouth Reviewed  Skin Reviewed  Nails Reviewed   Diet Order:   Diet Order             Diet regular Room service appropriate? Yes; Fluid consistency: Thin  Diet effective now                  EDUCATION NEEDS:  Education needs have been addressed  Skin:  Skin Assessment: Reviewed RN Assessment  Last BM:  10/25 - type 4  Height:  Ht Readings from Last 1 Encounters:  07/06/21 6\' 5"  (1.956 m)   Weight:  Wt Readings from Last 1 Encounters:  07/27/21 63.3 kg   Ideal Body Weight:  94.5 kg  BMI:  Body mass index is 16.55 kg/m.  Estimated Nutritional Needs:  Kcal:  2400-2600 Protein:  130-150 grams Fluid:  >2.4 L  Ranell Patrick, RD, LDN Clinical Dietitian Pager on [place]

## 2021-08-03 NOTE — TOC Progression Note (Addendum)
Transition of Care [facility]) - Progression Note    Patient Details  Name: Ryan [name] MRN: [id number] Date of Birth: 12/18/1960  Transition of Care [facility]) CM/SW [place], RN Phone Number: 08/03/2021, 11:40 AM  Clinical Narrative:    Confirmed with [facility] Director of Disability Services [phone] SSA disability filed 06/03/21 via paper and not electronically. Elmo Putt unable to confirm timeframe for turnaround time. No additional information needed.         Expected Discharge Plan and Services                                                 Social Determinants of Health (SDOH) Interventions    Readmission Risk Interventions Readmission Risk Prevention Plan 04/23/2021  Transportation Screening Complete  PCP or Specialist Appt within 3-5 Days Complete  HRI or [place] Complete  Social Work Consult for [place] Planning/Counseling Not Complete  SW consult not completed comments RNCM assigned to patient  Palliative Care Screening Complete  Medication Review Press photographer) Complete  Some recent data might be hidden

## 2021-08-03 NOTE — Plan of Care (Signed)
  Problem: Education: Goal: Knowledge of General Education information will improve Description: Including pain rating scale, medication(s)/side effects and non-pharmacologic comfort measures Outcome: Progressing   Problem: Health Behavior/Discharge Planning: Goal: Ability to manage health-related needs will improve Outcome: Progressing   Problem: Clinical Measurements: Goal: Ability to maintain clinical measurements within normal limits will improve Outcome: Progressing Goal: Will remain free from infection Outcome: Progressing Goal: Diagnostic test results will improve Outcome: Progressing Goal: Respiratory complications will improve Outcome: Progressing Goal: Cardiovascular complication will be avoided Outcome: Progressing   Problem: Activity: Goal: Risk for activity intolerance will decrease Outcome: Progressing   Problem: Nutrition: Goal: Adequate nutrition will be maintained Outcome: Progressing   Problem: Elimination: Goal: Will not experience complications related to bowel motility Outcome: Progressing Goal: Will not experience complications related to urinary retention Outcome: Progressing   Problem: Pain Managment: Goal: General experience of comfort will improve Outcome: Progressing   Problem: Safety: Goal: Ability to remain free from injury will improve Outcome: Progressing   Problem: Education: Goal: Knowledge of the prescribed therapeutic regimen will improve Outcome: Progressing   Problem: Activity: Goal: Ability to increase mobility will improve Outcome: Progressing

## 2021-08-03 NOTE — Progress Notes (Signed)
PROGRESS NOTE    Ryan [name]  DQQ:[id number] DOB: 1961/07/07 DOA: 06/02/2021 PCP: Pcp, No   Brief Narrative: Taken from prior notes. 60 year old man PMH metastatic prostate cancer presented with a fall resulting in closed left ankle fracture and left wrist fracture.  This was treated with conservative management.  Hospitalization uncomplicated.  Discharge delayed by difficulty with disposition.  Remains medically stable for discharge.  Accepting facility has some concern about the start date of his disability.  Patient received dose of Zometa on 07/30/2021.  Oncology repeated the PSA which seems rising at 1199 from 184. Patient wants to talk with his oncologist, talk with [name], he will try to see him soon. 10/25: Dr. Marc Morgans gone reordered a repeat PSA to confirm, we will see him after that resulted.  Subjective: Patient was again asking about [name] and when he will see him during morning rounds.  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  Secondary to fall.  Managed conservatively by orthopedic surgery.  Splint has been removed.  Able to work with PT and weightbearing. -Outpatient orthopedic follow-up in 2 weeks.  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  Being followed up by oncology as an outpatient.  Patient received his Zometa today, next dose will be on November 18. Getting Lupron every 32-month, prior dose was approximately a month ago. Repeat PSA done yesterday by oncology with significant rise to 1199 from 184 a month ago. Talked with [name] and he will  try to see him soon. A repeat PSA was ordered by [name] today.  Cancer cachexia/protein caloric malnutrition, severe. -Encourage p.o. intake with supplements.  Anemia of chronic disease. -Stable  Objective: Vitals:   08/02/21 1728 08/02/21 2112 08/03/21 0457 08/03/21 0737  BP: 129/62 (!) 99/58 (!) 108/54 103/68  Pulse: 94 85 90 88  Resp: 14 20 20 16   Temp: 98 F (36.7 C) 98.1 F (36.7 C) 98.4 F (36.9 C) 97.8 F (36.6 C)  TempSrc: Oral Oral    SpO2: 97% 98% 95% 97%  Weight:      Height:        Intake/Output Summary (Last 24 hours) at 08/03/2021 1503 Last data filed at 08/03/2021 0500 Gross per 24 hour  Intake 4 ml  Output 900 ml  Net -896 ml    Filed Weights   07/06/21 1406 07/07/21 1443 07/27/21 0500  Weight: 65.6 kg 62.5 kg 63.3 kg    Examination:  General.  Malnourished gentleman, in no acute distress. Pulmonary.  Lungs clear bilaterally, normal respiratory effort. CV.  Regular rate and rhythm, no JVD, rub or murmur. Abdomen.  Soft, nontender, nondistended, BS positive. CNS.  Alert and oriented .  No focal neurologic deficit. Extremities.  No edema, no cyanosis, pulses intact and symmetrical. Psychiatry.  Judgment and insight appears normal.   DVT prophylaxis: Xarelto Code Status: DNR Family Communication:  Disposition Plan:  Status is: Inpatient  Remains inpatient appropriate because: Unsafe discharge   level of care: Med-Surg  All the records are reviewed and case discussed with Care Management/Social Worker. Management  plans discussed with the patient, nursing and they are in agreement.  Consultants:  Orthopedic surgery Oncology:  Procedures:  Antimicrobials:   Data Reviewed: I have personally reviewed following labs and imaging studies  CBC: No results for input(s): WBC, NEUTROABS, HGB, HCT, MCV, PLT in the last 168 hours.  Basic Metabolic Panel: Recent Labs  Lab 07/30/21 0346  NA 132*  K 5.0  CL 106  CO2 21*  GLUCOSE 94   BUN 16  CREATININE 0.73  CALCIUM 8.4*    GFR: Estimated Creatinine Clearance: 89 mL/min (by C-G formula based on SCr of 0.73 mg/dL). Liver Function Tests: No results for input(s): AST, ALT, ALKPHOS, BILITOT, PROT, ALBUMIN in the last 168 hours. No results for input(s): LIPASE, AMYLASE in the last 168 hours. No results for input(s): AMMONIA in the last 168 hours. Coagulation Profile: No results for input(s): INR, PROTIME in the last 168 hours. Cardiac Enzymes: No results for input(s): CKTOTAL, CKMB, CKMBINDEX, TROPONINI in the last 168 hours. BNP (last 3 results) No results for input(s): PROBNP in the last 8760 hours. HbA1C: No results for input(s): HGBA1C in the last 72 hours. CBG: No results for input(s): GLUCAP in the last 168 hours. Lipid Profile: No results for input(s): CHOL, HDL, LDLCALC, TRIG, CHOLHDL, LDLDIRECT in the last 72 hours. Thyroid Function Tests: No results for input(s): TSH, T4TOTAL, FREET4, T3FREE, THYROIDAB in the last 72 hours. Anemia Panel: No results for input(s): VITAMINB12, FOLATE, FERRITIN, TIBC, IRON, RETICCTPCT in the last 72 hours. Sepsis Labs: No results for input(s): PROCALCITON, LATICACIDVEN in the last 168 hours.  No results found for this or any previous visit (from the past 240 hour(s)).   Radiology Studies: No results found.  Scheduled Meds:  calcium carbonate  1,250 mg Oral TID WC   feeding supplement  237 mL Oral TID BM   magnesium oxide  400 mg Oral Daily   midodrine  5 mg Oral TID WC   mirtazapine  15 mg Oral QHS   multivitamin with minerals  1 tablet Oral Daily   rivaroxaban  10 mg Oral Q supper   senna-docusate  1 tablet Oral BID   Continuous Infusions:  sodium chloride Stopped (06/04/21 1216)     LOS: 61 days   Time spent: 32 minutes. More than 50% of the time was spent in counseling/coordination of care  Lorella Nimrod, MD [facility]  If 7PM-7AM, please contact night-coverage [email or web address]  08/03/2021,  3:03 PM   This record has been created using Systems analyst. Errors have been sought and corrected,but may not always be located. Such creation errors do not reflect on the standard of care.

## 2021-08-03 NOTE — Progress Notes (Signed)
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  Pt recently here for multiple months with difficult d/c planning, did ultimatley d/c to homeless shelter and was gone only a few hours before fall with L ankle and wrist fractures with return to the ED.    PT Comments    Patient agreeable to PT. Patient ambulated into the hallway with Min guard assistance with rolling walker and cues for safety and technique. Overall activity tolerance limited by fatigue and generalized weakness. Recommend to continue PT to maximize independence.    Recommendations for follow up therapy are one component of a multi-disciplinary discharge planning process, led by the attending physician.  Recommendations may be updated based on patient status, additional functional criteria and insurance authorization.  Follow Up Recommendations  Home health PT     Assistance Recommended at Discharge Intermittent Supervision/Assistance  Equipment Recommendations  None recommended by PT    Recommendations for Other Services       Precautions / Restrictions Precautions Precautions: Fall Precaution Comments: WBAT Restrictions LUE Weight Bearing: Weight bearing as tolerated LLE Weight Bearing: Weight bearing as tolerated     Mobility  Bed Mobility Overal bed mobility: Independent                  Transfers Overall transfer level: Needs assistance   Transfers: Sit to/from Stand Sit to Stand: Min assist;From elevated surface           General transfer comment: verbal cues for safety. bed height elevated per patient request    Ambulation/Gait Ambulation/Gait assistance: Min  guard Gait Distance (Feet): 25 Feet Assistive device: Rolling walker (2 wheels) Gait Pattern/deviations: Step-through pattern;Trunk flexed;Decreased step length - right;Decreased step length - left Gait velocity: decreased   General Gait Details: verbal cues provided for technique and safety.unable to ambulate further due to fatigue   Stairs             Wheelchair Mobility    Modified Rankin (Stroke Patients Only)       Balance Overall balance assessment: Needs assistance Sitting-balance support: No upper extremity supported Sitting balance-Leahy Scale: Good     Standing balance support: Bilateral upper extremity supported;During functional activity Standing balance-Leahy Scale: Fair Standing balance comment: using rolling walker for UE support                            Cognition Arousal/Alertness: Awake/alert Behavior During Therapy: WFL for tasks assessed/performed Overall Cognitive Status: Within Functional Limits for tasks assessed                                          Exercises      General Comments        Pertinent Vitals/Pain Pain Assessment: Faces Faces Pain Scale: Hurts a little bit Pain Location: L wrist Pain Descriptors / Indicators: Sore    Home Living                          Prior Function  PT Goals (current goals can now be found in the care plan section) Acute Rehab PT Goals Patient Stated Goal: to be more independent PT Goal Formulation: With patient Time For Goal Achievement: 08/11/21 Potential to Achieve Goals: Fair Progress towards PT goals: Progressing toward goals    Frequency    Min 2X/week      PT Plan Current plan remains appropriate    Co-evaluation              AM-PAC PT "6 Clicks" Mobility   Outcome Measure  Help needed turning from your back to your side while in a flat bed without using bedrails?: None Help needed moving from lying on your back  to sitting on the side of a flat bed without using bedrails?: None Help needed moving to and from a bed to a chair (including a wheelchair)?: A Little Help needed standing up from a chair using your arms (e.g., wheelchair or bedside chair)?: A Little Help needed to walk in hospital room?: A Little Help needed climbing 3-5 steps with a railing? : A Lot 6 Click Score: 19    End of Session Equipment Utilized During Treatment: Gait belt Activity Tolerance: Patient tolerated treatment well Patient left: in bed;with call bell/phone within reach Nurse Communication: Mobility status PT Visit Diagnosis: Unsteadiness on feet (R26.81);Muscle weakness (generalized) (M62.81);Difficulty in walking, not elsewhere classified (R26.2);History of falling (Z91.81) Pain - Right/Left: Left Pain - part of body: Hand     Time: 9735-3299 PT Time Calculation (min) (ACUTE ONLY): 55 min  Charges:  $Gait Training: 23-37 mins $Therapeutic Activity: 23-37 mins                     Minna Merritts, PT, MPT    [name] 08/03/2021, 2:27 PM

## 2021-08-04 ENCOUNTER — Other Ambulatory Visit (HOSPITAL_COMMUNITY): Payer: Self-pay

## 2021-08-04 ENCOUNTER — Telehealth: Payer: Self-pay | Admitting: Pharmacy Technician

## 2021-08-04 ENCOUNTER — Telehealth: Payer: Self-pay | Admitting: Pharmacist

## 2021-08-04 DIAGNOSIS — C7951 Secondary malignant neoplasm of bone: Secondary | ICD-10-CM

## 2021-08-04 LAB — CBC
HCT: 26 % — ABNORMAL LOW (ref 39.0–52.0)
Hemoglobin: 8.1 g/dL — ABNORMAL LOW (ref 13.0–17.0)
MCH: 24.8 pg — ABNORMAL LOW (ref 26.0–34.0)
MCHC: 31.2 g/dL (ref 30.0–36.0)
MCV: 79.8 fL — ABNORMAL LOW (ref 80.0–100.0)
Platelets: 288 10*3/uL (ref 150–400)
RBC: 3.26 MIL/uL — ABNORMAL LOW (ref 4.22–5.81)
RDW: 13.7 % (ref 11.5–15.5)
WBC: 5.7 10*3/uL (ref 4.0–10.5)
nRBC: 0 % (ref 0.0–0.2)

## 2021-08-04 MED ORDER — ENZALUTAMIDE 80 MG PO TABS
160.0000 mg | ORAL_TABLET | Freq: Every day | ORAL | 2 refills | Status: DC
  Filled 2021-08-04 (×2): qty 60, 30d supply, fill #0

## 2021-08-04 NOTE — Telephone Encounter (Signed)
Oral Oncology Patient Advocate Encounter  After completing a benefits investigation, prior authorization for Ryan [name] is not required at this time through [place] Medicaid plan.  Patient's copay is $4.00.    [place] [facility] [place] Phone [phone] Fax [phone] 08/04/2021 10:52 AM

## 2021-08-04 NOTE — Progress Notes (Signed)
PROGRESS NOTE    Ryan [name]  TOI:[id number] DOB: 06/15/1961 DOA: 06/02/2021 PCP: Pcp, No  150A/150A-AA   Assessment & Plan:   Principal Problem:   Closed left ankle fracture Active Problems:   Fall   Anemia of chronic disease   Protein-calorie malnutrition, severe (HCC)   Cancer cachexia (HCC)   Prostate cancer metastatic to bone (HCC)   Left wrist fracture, closed, initial encounter   Hypocalcemia   Hyperkalemia   Closed fracture of left distal radius   Closed bimalleolar fracture of left ankle   60 year old man PMH metastatic prostate cancer presented with a fall resulting in closed left ankle fracture and left wrist fracture.  This was treated with conservative management.  Hospitalization uncomplicated.  Discharge delayed by difficulty with disposition.  Remains medically stable for discharge.  Accepting facility has some concern about the start date of his disability.   Patient received dose of Zometa on 07/30/2021.  Oncology repeated the PSA which seems rising at 1199 from 184. Patient wants to talk with his oncologist, talk with [name], he will try to see him soon. 10/25: Dr. Marc Morgans gone reordered a repeat PSA to confirm, we will see him after that resulted.     Closed left ankle and wrist fracture.  Secondary to fall.  Managed conservatively by orthopedic surgery.  Splint has been removed.  Able to work with PT and weightbearing.   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   Being followed up by oncology as an outpatient.  Patient received his Zometa today, next dose will be on November 18. Getting Lupron every 6-month, prior dose was approximately a month ago. Repeat PSA done yesterday by oncology with significant rise to 1199 from 184 a month ago. Talked with [name] and he will try to see him soon.   Cancer cachexia/protein caloric malnutrition, severe. -Encourage p.o. intake with supplements.   Anemia of chronic disease. -Stable   DVT  prophylaxis: IP:[id number]  Code Status: DNR  Family Communication:  Level of care: Med-Surg Dispo:   The patient is from: has been in the hospital for the past year Anticipated d/c is to: SNF Anticipated d/c date is: whenever bed available  Patient currently is medically ready to d/c.   Subjective and Interval History:  Pt complained of his left wrist hurting and wanted to rest.   Objective: Vitals:   08/03/21 1931 08/03/21 2302 08/04/21 0516 08/04/21 1558  BP: (!) 92/49 (!) 125/59 (!) 118/56 (!) 110/54  Pulse: 86 93 96 93  Resp: 16 16 16 16   Temp: 98.3 F (36.8 C) 98.4 F (36.9 C) 98.4 F (36.9 C) 98.1 F (36.7 C)  TempSrc: Oral Oral Oral   SpO2: 95% 94% 96% 97%  Weight:      Height:        Intake/Output Summary (Last 24 hours) at 08/04/2021 1955 Last data filed at 08/04/2021 1556 Gross per 24 hour  Intake 0 ml  Output 925 ml  Net -925 ml   Filed Weights   07/06/21 1406 07/07/21 1443 07/27/21 0500  Weight: 65.6 kg 62.5 kg 63.3 kg    Examination:   Constitutional: NAD, sleepy, oriented to self and place HEENT: conjunctivae and lids normal, EOMI CV: No cyanosis.   RESP: normal respiratory effort, on RA Neuro: II - XII grossly intact.   Psych: subdued mood and affect.     Data Reviewed: I have personally reviewed following labs and imaging studies  CBC: Recent Labs  Lab 08/04/21  0140  WBC 5.7  HGB 8.1*  HCT 26.0*  MCV 79.8*  PLT 620   Basic Metabolic Panel: Recent Labs  Lab 07/30/21 0346  NA 132*  K 5.0  CL 106  CO2 21*  GLUCOSE 94  BUN 16  CREATININE 0.73  CALCIUM 8.4*   GFR: Estimated Creatinine Clearance: 89 mL/min (by C-G formula based on SCr of 0.73 mg/dL). Liver Function Tests: No results for input(s): AST, ALT, ALKPHOS, BILITOT, PROT, ALBUMIN in the last 168 hours. No results for input(s): LIPASE, AMYLASE in the last 168 hours. No results for input(s): AMMONIA in the last 168 hours. Coagulation Profile: No results for input(s):  INR, PROTIME in the last 168 hours. Cardiac Enzymes: No results for input(s): CKTOTAL, CKMB, CKMBINDEX, TROPONINI in the last 168 hours. BNP (last 3 results) No results for input(s): PROBNP in the last 8760 hours. HbA1C: No results for input(s): HGBA1C in the last 72 hours. CBG: No results for input(s): GLUCAP in the last 168 hours. Lipid Profile: No results for input(s): CHOL, HDL, LDLCALC, TRIG, CHOLHDL, LDLDIRECT in the last 72 hours. Thyroid Function Tests: No results for input(s): TSH, T4TOTAL, FREET4, T3FREE, THYROIDAB in the last 72 hours. Anemia Panel: No results for input(s): VITAMINB12, FOLATE, FERRITIN, TIBC, IRON, RETICCTPCT in the last 72 hours. Sepsis Labs: No results for input(s): PROCALCITON, LATICACIDVEN in the last 168 hours.  No results found for this or any previous visit (from the past 240 hour(s)).    Radiology Studies: No results found.   Scheduled Meds:  calcium carbonate  1,250 mg Oral TID WC   feeding supplement  237 mL Oral TID BM   magnesium oxide  400 mg Oral Daily   midodrine  5 mg Oral TID WC   mirtazapine  15 mg Oral QHS   multivitamin with minerals  1 tablet Oral Daily   rivaroxaban  10 mg Oral Q supper   senna-docusate  1 tablet Oral BID   Continuous Infusions:  sodium chloride Stopped (06/04/21 1216)     LOS: 62 days     Enzo Bi, MD [facility] If 7PM-7AM, please contact night-coverage 08/04/2021, 7:55 PM

## 2021-08-04 NOTE — Telephone Encounter (Addendum)
Oral Oncology Pharmacist Encounter  Received new prescription for Xtandi (enzalutamide) for the treatment of metastatic castration resistant prostate cancer in conjunction with ADT therapy, planned duration until disease progression or unacceptable drug toxicity.  BP from 08/04/21 well controlled. Prescription dose and frequency assessed. No history of seizure seen in Epic chart review  Current medication list in Epic reviewed, several DDIs with enzalutamide identified: Inpatient med list review: Rivaroxaban: enzalutamide may decrease the serum concentration of Rivaroxaban. Rivaroxaban is being used to DVT prophylaxis. Reliability Rating of DDI, considered fair. No baseline dose adjustment needed. Enzalutamide may decrease the serum concentration of trazodone, mirtazapine, ondansetron, and tramadol. Monitor patient for decreased effectiveness of the listed medications. No baseline dose adjustment needed.  Outpatient med list review:  Enzalutamide may decrease the serum concentration of dexamethasone and mirtazapine. Monitor patient for decreased effectiveness of the listed medications. No baseline dose adjustment needed.   Evaluated chart and several patient barriers to medication adherence identified.  Prior to admission, patient was homeless. We can start treatment while he is inpatient, if he is again discharged to a homeless shelter we will need to arrange to him to pick up his medication refills from the cancer center monthly. Patient will be started on 80mg  tablets of enzalutamide to help decrease the pill burden and hopefully increase the adherence  Prescription has been e-scribed to the [facility] for benefits analysis and approval.  Oral Oncology Clinic will continue to follow for insurance authorization, copayment issues, initial counseling and start date.   Darl Pikes, PharmD, BCPS, BCOP, CPP Hematology/Oncology Clinical Pharmacist  Practitioner [facility] [place] [facility] [phone]  08/04/2021 11:13 AM

## 2021-08-05 ENCOUNTER — Other Ambulatory Visit (HOSPITAL_COMMUNITY): Payer: Self-pay

## 2021-08-05 MED ORDER — ENZALUTAMIDE 80 MG PO TABS
160.0000 mg | ORAL_TABLET | Freq: Every day | ORAL | Status: DC
  Administered 2021-08-05 – 2021-08-08 (×4): 160 mg via ORAL
  Filled 2021-08-05 (×6): qty 2

## 2021-08-05 NOTE — Progress Notes (Signed)
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  Pt recently here for multiple months with difficult d/c planning, did ultimatley d/c to homeless shelter and was gone only a few hours before fall with L ankle and wrist fractures with return to the ED.    PT Comments    Pt was long sitting in bed upon arriving. He agrees to session and is cooperative throughout this session. Did report that he will resume chemo later this date. Pt was able to exit bed without assistance, stand to RW (with LUE platform due to wrist pain) and ambulate 140 ft without seated rest. Pt continues to demonstrate improving strength and mobility. Pt is a difficulty placement. Recommend continued skilled PT going forward to address deficits while maximizing independence with ADLs.     Recommendations for follow up therapy are one component of a multi-disciplinary discharge planning process, led by the attending physician.  Recommendations may be updated based on patient status, additional functional criteria and insurance authorization.  Follow Up Recommendations  Home health PT     Assistance Recommended at Discharge Intermittent Supervision/Assistance  Equipment Recommendations  Other (comment) (ongoing assessment)       Precautions / Restrictions Precautions Precautions: Fall Precaution Comments: WBAT Restrictions Weight Bearing Restrictions: No RUE Weight Bearing: Weight bearing as tolerated LUE Weight Bearing: Weight bearing as tolerated RLE Weight Bearing: Weight bearing as tolerated LLE Weight Bearing: Weight bearing as tolerated     Mobility  Bed Mobility Overal bed mobility:  Needs Assistance Bed Mobility: Supine to Sit Rolling: Supervision Sidelying to sit: Supervision Supine to sit: Supervision Sit to supine: Supervision Sit to sidelying: Supervision      Transfers Overall transfer level: Needs assistance Equipment used: Rolling walker (2 wheels) Transfers: Sit to/from Stand Sit to Stand: Min assist;From elevated surface     Ambulation/Gait Ambulation/Gait assistance: Min guard Gait Distance (Feet): 140 Feet Assistive device: Rolling walker (2 wheels) Gait Pattern/deviations: Step-through pattern;Trunk flexed;Decreased step length - right;Decreased step length - left;Narrow base of support Gait velocity: decreased   General Gait Details: pt needs platform on RW for comfort due to c/o pain in L wrist with wt bearing. He tends to ambulate with narrow BOS and poor overall posture. Does correct with vcs.     Balance Overall balance assessment: Needs assistance Sitting-balance support: No upper extremity supported Sitting balance-Leahy Scale: Good     Standing balance support: Bilateral upper extremity supported;During functional activity Standing balance-Leahy Scale: Fair       Cognition Arousal/Alertness: Awake/alert Behavior During Therapy: WFL for tasks assessed/performed Overall Cognitive Status: Within Functional Limits for tasks assessed      General Comments: Pt is A and O and agreeable to session. He is easily distracted but cooperative and willing to perform all task desired               Pertinent Vitals/Pain Pain Assessment: 0-10 Pain Score: 6  Faces Pain Scale: Hurts even more Pain Location: LUE Pain Descriptors / Indicators: Sore Pain Intervention(s): Limited activity within patient's tolerance;Monitored during session;Premedicated before session;Repositioned     PT Goals (current goals can now be found in the care plan section) Acute Rehab PT Goals Patient Stated Goal:  get stronger and return to PLOF Progress  towards PT goals: Progressing toward goals    Frequency    Min 2X/week      PT Plan Current plan remains appropriate    Co-evaluation     PT goals addressed during session: Mobility/safety with mobility;Balance;Proper use of DME;Strengthening/ROM        AM-PAC PT "6 Clicks" Mobility   Outcome Measure  Help needed turning from your back to your side while in a flat bed without using bedrails?: None Help needed moving from lying on your back to sitting on the side of a flat bed without using bedrails?: None Help needed moving to and from a bed to a chair (including a wheelchair)?: A Little Help needed standing up from a chair using your arms (e.g., wheelchair or bedside chair)?: A Little Help needed to walk in hospital room?: A Little Help needed climbing 3-5 steps with a railing? : A Lot 6 Click Score: 19    End of Session Equipment Utilized During Treatment: Gait belt Activity Tolerance: Patient tolerated treatment well;Patient limited by fatigue Patient left: in bed;with call bell/phone within reach Nurse Communication: Mobility status PT Visit Diagnosis: Unsteadiness on feet (R26.81);Muscle weakness (generalized) (M62.81);Difficulty in walking, not elsewhere classified (R26.2);History of falling (Z91.81) Pain - Right/Left: Left Pain - part of body: Hand     Time: 1027-2536 PT Time Calculation (min) (ACUTE ONLY): 23 min  Charges:  $Gait Training: 8-22 mins $Therapeutic Activity: 8-22 mins                     Julaine Fusi PTA 08/05/21, 1:34 PM

## 2021-08-05 NOTE — Progress Notes (Addendum)
[place]  Telephone:([phone] [phone]  Patient Care Team: Pcp, No as PCP - General   Name of the patient: Ryan [name]  [id number]  1961-02-24   HPI: Patient is a 60 y.o. male with progressive metastatic prostate cancer, now castration resistant. Xtandi (enzalutamide) will be added onto his ADT treatment.  Reason for Consult: Xtandi (enzalutamide) oral chemotherapy education.   PAST MEDICAL HISTORY: Past Medical History:  Diagnosis Date   Adenomatous colon polyp    Anemia    Depression    Elevated LFTs     HEMATOLOGY/ONCOLOGY HISTORY:  Oncology History   No history exists.    ALLERGIES:  has No Known Allergies.  MEDICATIONS:  Current Facility-Administered Medications  Medication Dose Route Frequency Provider Last Rate Last Admin   0.9 %  sodium chloride infusion   Intravenous PRN Lorella Nimrod, MD   Stopped at 06/04/21 1216   acetaminophen (TYLENOL) 160 MG/5ML solution 650 mg  650 mg Oral Q6H PRN Ivor Costa, MD       calcium carbonate (OS-CAL - dosed in mg of elemental calcium) tablet 1,250 mg  1,250 mg Oral TID WC Ivor Costa, MD   1,250 mg at 08/05/21 1749   enzalutamide (XTANDI) tablet 160 mg  160 mg Oral Daily Lloyd Huger, MD       feeding supplement (ENSURE ENLIVE / ENSURE PLUS) liquid 237 mL  237 mL Oral TID BM Samuella Cota, MD   237 mL at 08/05/21 4496   hydrOXYzine (ATARAX/VISTARIL) tablet 25 mg  25 mg Oral TID PRN Ivor Costa, MD   25 mg at 07/22/21 2226   magnesium oxide (MAG-OX) tablet 400 mg  400 mg Oral Daily Enzo Bi, MD   400 mg at 08/05/21 0958   midodrine (PROAMATINE) tablet 5 mg  5 mg Oral TID WC Kathie Dike, MD   5 mg at 08/05/21 0958   mirtazapine (REMERON) tablet 15 mg  15 mg Oral QHS Ivor Costa, MD   15 mg at 08/04/21 2036   multivitamin with minerals tablet 1 tablet  1 tablet Oral Daily Lorella Nimrod, MD   1 tablet at 08/05/21 0958   ondansetron (ZOFRAN) injection 4  mg  4 mg Intravenous Q8H PRN Ivor Costa, MD   4 mg at 06/25/21 0556   ondansetron (ZOFRAN-ODT) disintegrating tablet 4 mg  4 mg Oral Q8H PRN Enzo Bi, MD   4 mg at 07/31/21 7591   rivaroxaban (XARELTO) tablet 10 mg  10 mg Oral Q supper Rauer, Forde Dandy, RPH   10 mg at 08/04/21 1757   senna-docusate (Senokot-S) tablet 1 tablet  1 tablet Oral BID Ralene Muskrat B, MD   1 tablet at 08/04/21 2036   traMADol (ULTRAM) tablet 50 mg  50 mg Oral Q6H PRN Enzo Bi, MD   50 mg at 08/04/21 1755   traZODone (DESYREL) tablet 150 mg  150 mg Oral QHS PRN Jennye Boroughs, MD   150 mg at 08/04/21 2041    VITAL SIGNS: BP 99/60 (BP Location: Left Leg)   Pulse 94   Temp 97.8 F (36.6 C) (Oral)   Resp 16   Ht 6\' 5"  (1.956 m)   Wt 63.3 kg (139 lb 8.8 oz)   SpO2 95%   BMI 16.55 kg/m  Filed Weights   07/06/21 1406 07/07/21 1443 07/27/21 0500  Weight: 65.6 kg (144 lb 10 oz) 62.5 kg (137 lb 12.6 oz) 63.3 kg (139 lb 8.8 oz)  Estimated body mass index is 16.55 kg/m as calculated from the following:   Height as of this encounter: 6\' 5"  (1.956 m).   Weight as of this encounter: 63.3 kg (139 lb 8.8 oz).  LABS: CBC:    Component Value Date/Time   WBC 5.7 08/04/2021 0140   HGB 8.1 (L) 08/04/2021 0140   HCT 26.0 (L) 08/04/2021 0140   PLT 288 08/04/2021 0140   MCV 79.8 (L) 08/04/2021 0140   NEUTROABS 4.1 06/18/2021 1802   LYMPHSABS 1.3 06/18/2021 1802   MONOABS 1.0 06/18/2021 1802   EOSABS 0.1 06/18/2021 1802   BASOSABS 0.0 06/18/2021 1802   Comprehensive Metabolic Panel:    Component Value Date/Time   NA 132 (L) 07/30/2021 0346   K 5.0 07/30/2021 0346   CL 106 07/30/2021 0346   CO2 21 (L) 07/30/2021 0346   BUN 16 07/30/2021 0346   CREATININE 0.73 07/30/2021 0346   GLUCOSE 94 07/30/2021 0346   CALCIUM 8.4 (L) 07/30/2021 0346   AST 16 06/04/2021 0528   ALT 17 06/04/2021 0528   ALKPHOS 1,475 (H) 06/04/2021 0528   BILITOT 0.8 06/04/2021 0528   PROT 5.7 (L) 06/04/2021 0528   ALBUMIN 3.1 (L)  06/30/2021 0353     Present during today's visit: patient only, seen in [facility] hospital room  Start plan: enzalutamide will begin today 08/05/21, it will be administered as a home medication from the inpatient pharmacy.  Medication has hand deliver to the inpatient pharmacy this morning   Patient Education I spoke with patient for overview of new oral chemotherapy medication: enzalutamide   Administration: Counseled patient on administration, dosing, side effects, monitoring, drug-food interactions, safe handling, storage, and disposal. Patient will take 2 tablets (160mg ) by mouth daily.  Side Effects: Side effects include but not limited to: fatigue, HTN, edema, and diarrhea.    Adherence: After discussion with patient no patient barriers to medication adherence identified while he is inpatient.  Prior to admission, patient was homeless. We can start treatment while he is inpatient, he should be discharge with whatever remaining medication is left from his inpatient pharmacy home med. If he is again discharged to a homeless shelter we will need to arrange to him to pick up his medication refills from the cancer center monthly. Patient will be started on 80mg  tablets of enzalutamide to help decrease the pill burden and hopefully increase the adherence  [name] voiced understanding and appreciation. All questions answered. Medication handout provided.  Provided patient with [facility] phone number. Patient knows to call the office with questions or concerns. Oral Chemotherapy Navigation Clinic will continue to follow.  Patient expressed understanding and was in agreement with this plan. He also understands that He can call clinic at any time with any questions, concerns, or complaints.   Medication Access Issues: No issues, patient has a $4 copay through Medicaid at [place]. Patient okay with cost being placed on an AR account at the pharmacy.    Follow-up plan: Will check in with patient on one week  Thank you for allowing me to participate in the care of this patient.   Time Total: 20 mins  Visit consisted of counseling and education on dealing with issues of symptom management in the setting of serious and potentially life-threatening illness.Greater than 50%  of this time was spent counseling and coordinating care related to the above assessment and plan.  Signed by: Darl Pikes, PharmD, BCPS, BCOP, CPP Hematology/Oncology Clinical Pharmacist Practitioner  [facility] Oral Chemotherapy Navigation [facility] [phone]  08/05/2021 10:17 AM

## 2021-08-05 NOTE — Progress Notes (Signed)
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  INTERVAL HISTORY: Patient remains in the hospital and is on approximately day 63 of this particular admission.  He reports his appetite is better and he is walking more.  He does not complain of pain today.  Patient offers no specific complaints today.  REVIEW OF SYSTEMS:   Review of Systems  Constitutional: Negative.  Negative for fever, malaise/fatigue and weight loss.  Respiratory: Negative.  Negative for cough, hemoptysis and shortness of breath.   Cardiovascular: Negative.  Negative for chest pain and leg swelling.  Gastrointestinal: Negative.  Negative for abdominal pain.  Genitourinary: Negative.  Negative for dysuria.  Musculoskeletal: Negative.  Negative for back pain.  Skin: Negative.  Negative for rash.  Neurological:  Positive for weakness. Negative for dizziness, focal weakness and headaches.  Psychiatric/Behavioral: Negative.  The patient is not nervous/anxious.    As per HPI. Otherwise, a complete review of systems is negative.  PAST MEDICAL HISTORY: Past Medical History:  Diagnosis Date   Adenomatous colon polyp    Anemia    Depression    Elevated LFTs     PAST SURGICAL HISTORY: Past Surgical History:  Procedure Laterality Date   FRACTURE SURGERY     INTRAMEDULLARY (IM) NAIL INTERTROCHANTERIC Right 04/02/2020   Procedure: INTRAMEDULLARY (IM) NAIL INTERTROCHANTRIC;  Surgeon: Corky Mull, MD;  Location: [facility] ORS;  Service: Orthopedics;  Laterality: Right;    FAMILY HISTORY: Family History  Problem Relation Age of Onset   Heart disease Other    Colon polyps Cousin        maternal   Colon polyps Cousin    Colon polyps Maternal Aunt    Colon cancer Maternal Grandfather     ADVANCED  DIRECTIVES (Y/N):  @ADVDIR @  HEALTH MAINTENANCE: Social History   Tobacco Use   Smoking status: Some Days   Smokeless tobacco: Never  Substance Use Topics   Alcohol use: Yes    Comment: 4 beers weekly   Drug use: No     Colonoscopy:  PAP:  Bone density:  Lipid panel:  No Known Allergies  Current Facility-Administered Medications  Medication Dose Route Frequency Provider Last Rate Last Admin   0.9 %  sodium chloride infusion   Intravenous PRN Lorella Nimrod, MD   Stopped at 06/04/21 1216   acetaminophen (TYLENOL) 160 MG/5ML solution 650 mg  650 mg Oral Q6H PRN Ivor Costa, MD       calcium carbonate (OS-CAL - dosed in mg of elemental calcium) tablet 1,250 mg  1,250 mg Oral TID WC Ivor Costa, MD   1,250 mg at 08/04/21 0932   enzalutamide (XTANDI) tablet 160 mg  160 mg Oral Daily Lloyd Huger, MD       feeding supplement (ENSURE ENLIVE / ENSURE PLUS) liquid 237 mL  237 mL Oral TID BM Samuella Cota, MD   237 mL at 08/04/21 1600   hydrOXYzine (ATARAX/VISTARIL) tablet 25 mg  25 mg Oral TID PRN Ivor Costa, MD   25 mg at 07/22/21 2226   magnesium oxide (MAG-OX) tablet 400 mg  400 mg Oral Daily Enzo Bi, MD   400 mg at 08/04/21 0931   midodrine (PROAMATINE) tablet 5 mg  5 mg Oral TID WC Kathie Dike, MD   5 mg at 08/04/21 1755  mirtazapine (REMERON) tablet 15 mg  15 mg Oral QHS Ivor Costa, MD   15 mg at 08/04/21 2036   multivitamin with minerals tablet 1 tablet  1 tablet Oral Daily Lorella Nimrod, MD   1 tablet at 08/04/21 0930   ondansetron (ZOFRAN) injection 4 mg  4 mg Intravenous Q8H PRN Ivor Costa, MD   4 mg at 06/25/21 0556   ondansetron (ZOFRAN-ODT) disintegrating tablet 4 mg  4 mg Oral Q8H PRN Enzo Bi, MD   4 mg at 07/31/21 7902   rivaroxaban (XARELTO) tablet 10 mg  10 mg Oral Q supper Rauer, Forde Dandy, RPH   10 mg at 08/04/21 1757   senna-docusate (Senokot-S) tablet 1 tablet  1 tablet Oral BID Ralene Muskrat B, MD   1 tablet at 08/04/21 2036   traMADol (ULTRAM)  tablet 50 mg  50 mg Oral Q6H PRN Enzo Bi, MD   50 mg at 08/04/21 1755   traZODone (DESYREL) tablet 150 mg  150 mg Oral QHS PRN Jennye Boroughs, MD   150 mg at 08/04/21 2041    OBJECTIVE: Vitals:   08/05/21 0531 08/05/21 0809  BP: (!) 115/56 99/60  Pulse: 100 94  Resp: 16 16  Temp: 99.6 F (37.6 C) 97.8 F (36.6 C)  SpO2: 95% 95%     Body mass index is 16.55 kg/m.    ECOG FS:1 - Symptomatic but completely ambulatory  General: Well-developed, well-nourished, no acute distress. Eyes: Pink conjunctiva, anicteric sclera. HEENT: Normocephalic, moist mucous membranes. Lungs: No audible wheezing or coughing. Heart: Regular rate and rhythm. Abdomen: Soft, nontender, no obvious distention. Musculoskeletal: No edema, cyanosis, or clubbing. Neuro: Alert, answering all questions appropriately. Cranial nerves grossly intact. Skin: No rashes or petechiae noted. Psych: Normal affect.   LAB RESULTS:  Lab Results  Component Value Date   NA 132 (L) 07/30/2021   K 5.0 07/30/2021   CL 106 07/30/2021   CO2 21 (L) 07/30/2021   GLUCOSE 94 07/30/2021   BUN 16 07/30/2021   CREATININE 0.73 07/30/2021   CALCIUM 8.4 (L) 07/30/2021   PROT 5.7 (L) 06/04/2021   ALBUMIN 3.1 (L) 06/30/2021   AST 16 06/04/2021   ALT 17 06/04/2021   ALKPHOS 1,475 (H) 06/04/2021   BILITOT 0.8 06/04/2021   GFRNONAA >60 07/30/2021   GFRAA >60 04/06/2020    Lab Results  Component Value Date   WBC 5.7 08/04/2021   NEUTROABS 4.1 06/18/2021   HGB 8.1 (L) 08/04/2021   HCT 26.0 (L) 08/04/2021   MCV 79.8 (L) 08/04/2021   PLT 288 08/04/2021     STUDIES: No results found.  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  Patient last received Lupron 22.5 mg on July 02, 2021.   He last received Zometa on July 30, 2021.  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 Xtandi 160 mg daily indefinitely or until progression of disease will follow..  Case discussed with clinical pharmacy who will assist in obtaining treatment for patient.  Continue monthly Zometa with his next dose due on approximately August 27, 2021. Anemia: Chronic and unchanged.  Patient's most recent hemoglobin is 8.1. Bony disease: Patient last received Zometa on July 30, 2021.  He has a mild hypocalcemia at 8.4. Disposition: Patient now has  Medicaid and possibly could be discharged to rehab if accepted.  Will arrange follow-up in the cancer center upon discharge.  Will follow.  Lloyd Huger, MD   08/05/2021 9:24 AM

## 2021-08-05 NOTE — Progress Notes (Signed)
PROGRESS NOTE    Ryan [name]  FAO:[id number] DOB: 1961-09-29 DOA: 06/02/2021 PCP: Pcp, No  150A/150A-AA   Assessment & Plan:   Principal Problem:   Closed left ankle fracture Active Problems:   Fall   Anemia of chronic disease   Protein-calorie malnutrition, severe (HCC)   Cancer cachexia (HCC)   Prostate cancer metastatic to bone (HCC)   Left wrist fracture, closed, initial encounter   Hypocalcemia   Hyperkalemia   Closed fracture of left distal radius   Closed bimalleolar fracture of left ankle   60 year old man PMH metastatic prostate cancer presented with a fall resulting in closed left ankle fracture and left wrist fracture.  This was treated with conservative management.  Hospitalization uncomplicated.  Discharge delayed by difficulty with disposition.  Remains medically stable for discharge.  Accepting facility has some concern about the start date of his disability.   Patient received dose of Zometa on 07/30/2021.  Oncology repeated the PSA which seems rising at 1199 from 184. Patient wants to talk with his oncologist, talk with [name], he will try to see him soon. 10/25: Dr. Marc Morgans gone reordered a repeat PSA to confirm, we will see him after that resulted.     Closed left ankle and wrist fracture.  Secondary to fall.  Managed conservatively by orthopedic surgery.  Splint has been removed.  Able to work with PT and weightbearing.   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   Being followed up by oncology as an outpatient.  Patient received his Zometa today, next dose will be on November 18. Getting Lupron every 58-month, prior dose was approximately a month ago. Repeat PSA done yesterday by oncology with significant rise to 1199 from 184 a month ago. --switch treatment to p.o. Xtandi 160 mg daily indefinitely, per oncology --Continue monthly Zometa with his next dose due on approximately August 27, 2021.   Cancer cachexia/protein caloric  malnutrition, severe. -Encourage p.o. intake with supplements.   Anemia of chronic disease. -Stable   DVT prophylaxis: ON:[id number]  Code Status: DNR  Family Communication:  Level of care: Med-Surg Dispo:   The patient is from: has been in the hospital for the past year Anticipated d/c is to: SNF Anticipated d/c date is: whenever bed available  Patient currently is medically ready to d/c.   Subjective and Interval History:  Pt appeared lethargic, but reported having slept well last night.  Said he worked with PT.     Objective: Vitals:   08/05/21 0531 08/05/21 0809 08/05/21 1158 08/05/21 1528  BP: (!) 115/56 99/60 (!) 113/59 (!) 104/55  Pulse: 100 94 100 (!) 101  Resp: 16 16 18 16   Temp: 99.6 F (37.6 C) 97.8 F (36.6 C) 98.1 F (36.7 C) 97.8 F (36.6 C)  TempSrc:  Oral    SpO2: 95% 95% 99% 98%  Weight:      Height:        Intake/Output Summary (Last 24 hours) at 08/05/2021 1845 Last data filed at 08/05/2021 1700 Gross per 24 hour  Intake --  Output 1000 ml  Net -1000 ml   Filed Weights   07/06/21 1406 07/07/21 1443 07/27/21 0500  Weight: 65.6 kg 62.5 kg 63.3 kg    Examination:   Constitutional: NAD, sleepy, oriented HEENT: conjunctivae and lids normal, EOMI CV: No cyanosis.   RESP: normal respiratory effort, on RA Extremities: No effusions, edema in BLE Neuro: II - XII grossly intact.     Data Reviewed: I have personally  reviewed following labs and imaging studies  CBC: Recent Labs  Lab 08/04/21 0140  WBC 5.7  HGB 8.1*  HCT 26.0*  MCV 79.8*  PLT 016   Basic Metabolic Panel: Recent Labs  Lab 07/30/21 0346  NA 132*  K 5.0  CL 106  CO2 21*  GLUCOSE 94  BUN 16  CREATININE 0.73  CALCIUM 8.4*   GFR: Estimated Creatinine Clearance: 89 mL/min (by C-G formula based on SCr of 0.73 mg/dL). Liver Function Tests: No results for input(s): AST, ALT, ALKPHOS, BILITOT, PROT, ALBUMIN in the last 168 hours. No results for input(s): LIPASE,  AMYLASE in the last 168 hours. No results for input(s): AMMONIA in the last 168 hours. Coagulation Profile: No results for input(s): INR, PROTIME in the last 168 hours. Cardiac Enzymes: No results for input(s): CKTOTAL, CKMB, CKMBINDEX, TROPONINI in the last 168 hours. BNP (last 3 results) No results for input(s): PROBNP in the last 8760 hours. HbA1C: No results for input(s): HGBA1C in the last 72 hours. CBG: No results for input(s): GLUCAP in the last 168 hours. Lipid Profile: No results for input(s): CHOL, HDL, LDLCALC, TRIG, CHOLHDL, LDLDIRECT in the last 72 hours. Thyroid Function Tests: No results for input(s): TSH, T4TOTAL, FREET4, T3FREE, THYROIDAB in the last 72 hours. Anemia Panel: No results for input(s): VITAMINB12, FOLATE, FERRITIN, TIBC, IRON, RETICCTPCT in the last 72 hours. Sepsis Labs: No results for input(s): PROCALCITON, LATICACIDVEN in the last 168 hours.  No results found for this or any previous visit (from the past 240 hour(s)).    Radiology Studies: No results found.   Scheduled Meds:  calcium carbonate  1,250 mg Oral TID WC   enzalutamide  160 mg Oral Daily   feeding supplement  237 mL Oral TID BM   magnesium oxide  400 mg Oral Daily   midodrine  5 mg Oral TID WC   mirtazapine  15 mg Oral QHS   multivitamin with minerals  1 tablet Oral Daily   rivaroxaban  10 mg Oral Q supper   senna-docusate  1 tablet Oral BID   Continuous Infusions:  sodium chloride Stopped (06/04/21 1216)     LOS: 63 days     Enzo Bi, MD [facility] If 7PM-7AM, please contact night-coverage 08/05/2021, 6:45 PM

## 2021-08-05 NOTE — Telephone Encounter (Signed)
Oral Oncology Patient Advocate Encounter  Ryan [name] at [facility] and Nuala Alpha, oral chemo pharmacist, picked up on 10/27.  She delivered medication to [facility] inpatient pharmacy as a "home" medication for patient to start.  Alyson educated patient in person.  Will coordinate refills with the patient if he is discharged from the hospital.  Patient has some barriers that may cause issues with adherence.  [place] Patient [place] Phone [phone] Fax [phone] 08/05/2021 10:10 AM

## 2021-08-05 NOTE — Telephone Encounter (Signed)
Patient educated as an inpatient, see inpatient note from 08/05/21. Patient to start Xtandi today 08/05/21.  Darl Pikes, PharmD, BCPS, BCOP, CPP Hematology/Oncology Clinical Pharmacist Practitioner [facility] [phone]  08/05/2021 10:50 AM

## 2021-08-06 NOTE — TOC Progression Note (Signed)
Transition of Care [facility]) - Progression Note    Patient Details  Name: Markeith Jue MRN: [id number] Date of Birth: 05/09/1961  Transition of Care [facility]) CM/SW [place], RN Phone Number: 08/06/2021, 10:42 AM  Clinical Narrative:    Reached out to [facility]  requesting an update for when they can take the patient, Let her know that it was confirmed that his disability is still pending,         Expected Discharge Plan and Services                                                 Social Determinants of Health (SDOH) Interventions    Readmission Risk Interventions Readmission Risk Prevention Plan 04/23/2021  Transportation Screening Complete  PCP or Specialist Appt within 3-5 Days Complete  HRI or Home Care Consult Complete  Social Work Consult for [place] Planning/Counseling Not Complete  SW consult not completed comments RNCM assigned to patient  Palliative Care Screening Complete  Medication Review Press photographer) Complete  Some recent data might be hidden

## 2021-08-06 NOTE — Progress Notes (Signed)
Nutrition Follow-up  DOCUMENTATION CODES:   Severe malnutrition in context of chronic illness, Underweight  INTERVENTION:   -Continue Ensure Enlive po TID, each supplement provides 350 kcal and 20 grams of protein  -Continue MVI with  minerals daily -Continue double protein portions with meals -Magic cup TID with meals, each supplement provides 290 kcal and 9 grams of protein   NUTRITION DIAGNOSIS:   Severe Malnutrition related to chronic illness, cancer and cancer related treatments as evidenced by severe fat depletion, severe muscle depletion.  Ongoing  GOAL:   Patient will meet greater than or equal to 90% of their needs  Progressing   MONITOR:   PO intake, Supplement acceptance, Labs, Weight trends, I & O's  REASON FOR ASSESSMENT:   Malnutrition Screening Tool    ASSESSMENT:   60 yo male with a PMH of prostate cancer metastasized to bone, cancer cachexia, anemia, depression, and anxiety. Recently admitted 6/21-8/24. Discharged to homeless shelter then returned to ED a few hours later after a fall and pain to his wrist.  Reviewed I/O's: -610 ml x 24 hours and -8.6 L since admission 07/23/21  UOP: 850 ml x 24 hours  Spoke with pt, who reports feeling weak today. He reports that he has some nausea and always feels weak after getting chemo. He is preparing to order lunch.   Per pt, he was consuming Ensure consistently earlier this hospitalization, but now estimates consuming only about one per day. Discussed importance of good meal and supplement intake to promote healing. Pt amenable to continue Ensure and try Magic Cups.    Medications reviewed and include calcium carbonate, magnesium oxide, and senokot.   Labs reviewed: Na: 132.    Diet Order:   Diet Order             Diet regular Room service appropriate? Yes; Fluid consistency: Thin  Diet effective now                   EDUCATION NEEDS:   Education needs have been addressed  Skin:  Skin  Assessment: Reviewed RN Assessment  Last BM:  08/05/21  Height:   Ht Readings from Last 1 Encounters:  07/06/21 6\' 5"  (1.956 m)    Weight:   Wt Readings from Last 1 Encounters:  07/27/21 63.3 kg    Ideal Body Weight:  94.5 kg  BMI:  Body mass index is 16.55 kg/m.  Estimated Nutritional Needs:   Kcal:  2400-2600  Protein:  130-150 grams  Fluid:  >2.4 L    Loistine Chance, RD, LDN, [place] Registered Dietitian II Certified Diabetes Care and Education Specialist Please refer to [facility] for RD and/or RD on-call/weekend/after hours pager

## 2021-08-06 NOTE — Progress Notes (Signed)
PT Cancellation Note  Patient Details Name: Mcihael Hinderman MRN: [id number] DOB: Apr 05, 1961   Cancelled Treatment:    Reason Eval/Treat Not Completed: Other (comment) Attempted to see pt for PT tx but pt declining 2/2 fatigue & wanting to rest. Pt also requests therapy before his daily chemo medication at 1pm - made note for team to be aware. Will re-attempt to see pt as able & as pt is willing.  [name], PT, DPT 08/06/21, 3:56 PM    [name] 08/06/2021, 3:55 PM

## 2021-08-06 NOTE — Progress Notes (Signed)
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  Pt recently here for multiple months with difficult d/c planning, did ultimatley d/c to homeless shelter and was gone only a few hours before fall with L ankle and wrist fractures with return to the ED.   OT comments  Pt seen for OT tx this date to f/u re: safety with ADLs/ADL mobility. OT engages pt in STS with platform RW, fxl mobility and standing self care to improve activity tolerance. Pt does endorse generally feeling poorly since starting oral cancer medications and only tolerates ~3 min standing task, ~5 all together with ambulation to/from. Requires MIN/MOD A for transfer, MIN A for balance with fxl mobility and CGA for static standing while performing oral care sink-side. Pt left in chair with all needs met and in reach at end of session. Will continue to follow acutely. Some goals adjusted to reflect progress while others were tailored to address functional activity tolerance as that is patient's primary area of deficit in terms of meaningful occupation. Continue to anticipate he will have extensive rehabilitation requirements upon d/c, and mindful of the possibility that cancer medications will impact pt progress towards OT goals.    Recommendations for follow up therapy are one component of a multi-disciplinary discharge planning process, led by the attending physician.  Recommendations may be updated based on patient status, additional functional criteria and insurance authorization.    Follow Up Recommendations  Skilled nursing-short term rehab (<3 hours/day)    Assistance Recommended at Discharge Frequent or  constant Supervision/Assistance  Equipment Recommendations  Other (comment) (defer to next venue.)    Recommendations for Other Services      Precautions / Restrictions Precautions Precautions: Fall Restrictions Weight Bearing Restrictions: Yes LUE Weight Bearing: Weight bearing as tolerated LLE Weight Bearing: Weight bearing as tolerated Other Position/Activity Restrictions: splints removed 10/5, pt WBAT to L LE well, still prefers use of platform for L UE on RW       Mobility Bed Mobility Overal bed mobility: Needs Assistance Bed Mobility: Supine to Sit     Supine to sit: Supervision          Transfers Overall transfer level: Needs assistance Equipment used: Rolling walker (2 wheels) Transfers: Sit to/from Stand Sit to Stand: Min assist;Mod assist;From elevated surface           General transfer comment: cues to use R hand to push to stand     Balance Overall balance assessment: Needs assistance Sitting-balance support: No upper extremity supported Sitting balance-Leahy Scale: Good     Standing balance support: Bilateral upper extremity supported;During functional activity Standing balance-Leahy Scale: Fair Standing balance comment: platform L side                           ADL either performed or assessed with clinical judgement   ADL Overall ADL's : Needs assistance/impaired     Grooming: Oral care;Min guard;Standing Grooming Details (indicate cue type and reason): tolerates standing sink-side for ~3 minutes to complete one grooming task with SETUP and CGA for standing balance with platform RW  Functional mobility during ADLs: Min guard;Rolling walker (2 wheels) (platform RW, to/from sink in room, for g/h task)       Vision Patient Visual Report: No change from baseline     Perception     Praxis      Cognition Arousal/Alertness: Awake/alert Behavior During Therapy: WFL for tasks  assessed/performed Overall Cognitive Status: Within Functional Limits for tasks assessed                                 General Comments: A&O, somewhat less conversive today, endorses feeling poorly since starting oral cancer medications.          Exercises Other Exercises Other Exercises: OT engages pt in STS with platform RW, fxl mobility and standing self care to improve activity tolerance. Pt does endorse generally feeling poorly since starting oral cancer medications and only tolerates ~3 min standing task, ~5 all together with ambulation to/from. Requires MIN/MOD A for transfer, MIN A for balance with fxl mobility and CGA for static standing while performing oral care sink-side. Pt left in chair with all needs met and in reach at end of session.   Shoulder Instructions       General Comments      Pertinent Vitals/ Pain       Pain Assessment: 0-10 Pain Score: 5  Pain Location: L wrist Pain Descriptors / Indicators: Sore Pain Intervention(s): Monitored during session;Other (comment) (gentle ROM)  Home Living                                          Prior Functioning/Environment              Frequency  Min 1X/week        Progress Toward Goals  OT Goals(current goals can now be found in the care plan section)  Progress towards OT goals: Progressing toward goals  Acute Rehab OT Goals OT Goal Formulation: With patient Time For Goal Achievement: 09/06/21 Potential to Achieve Goals: Good ADL Goals Pt Will Perform Grooming: with supervision;standing (with LRAD, sink-side to complete 2-3 g/h tasks to increase standing tolerance) Pt Will Perform Lower Body Dressing: with modified independence;sit to/from stand (to perform clothing mgt over hips with elastic waist underwear/pants) Pt Will Transfer to Toilet: with supervision;ambulating;grab bars (with LRAD to restroom with BSC over commode to elevate)  Plan Frequency remains  appropriate;Discharge plan remains appropriate    Co-evaluation                 AM-PAC OT "6 Clicks" Daily Activity     Outcome Measure   Help from another person eating meals?: None Help from another person taking care of personal grooming?: A Little Help from another person toileting, which includes using toliet, bedpan, or urinal?: A Little Help from another person bathing (including washing, rinsing, drying)?: A Little Help from another person to put on and taking off regular upper body clothing?: A Little Help from another person to put on and taking off regular lower body clothing?: A Little 6 Click Score: 19    End of Session Equipment Utilized During Treatment: Rolling walker (2 wheels)  OT Visit Diagnosis: Unsteadiness on feet (R26.81);Muscle weakness (generalized) (M62.81);History of falling (Z91.81);Adult, failure to thrive (R62.7) Pain - Right/Left: Left Pain - part of body: Arm   Activity Tolerance Patient tolerated  treatment well   Patient Left in chair;with call bell/phone within reach   Nurse Communication Mobility status        Time: 8366-2947 OT Time Calculation (min): 35 min  Charges: OT General Charges $OT Visit: 1 Visit OT Treatments $Self Care/Home Management : 8-22 mins $Therapeutic Activity: 8-22 mins  Gerrianne Scale, [place], OTR/L ascom [phone] 08/06/21, 5:41 PM

## 2021-08-06 NOTE — Progress Notes (Addendum)
PROGRESS NOTE    Ryan [name]  ZOX:[id number] DOB: 1960/11/29 DOA: 06/02/2021 PCP: Pcp, No  150A/150A-AA   Assessment & Plan:   Principal Problem:   Closed left ankle fracture Active Problems:   Fall   Anemia of chronic disease   Protein-calorie malnutrition, severe (HCC)   Cancer cachexia (HCC)   Prostate cancer metastatic to bone (HCC)   Left wrist fracture, closed, initial encounter   Hypocalcemia   Hyperkalemia   Closed fracture of left distal radius   Closed bimalleolar fracture of left ankle   60 year old man PMH metastatic prostate cancer presented with a fall resulting in closed left ankle fracture and left wrist fracture.  This was treated with conservative management.  Hospitalization uncomplicated.  Discharge delayed by difficulty with disposition.  Remains medically stable for discharge.  Accepting facility has some concern about the start date of his disability.   Patient received dose of Zometa on 07/30/2021.  Oncology repeated the PSA which seems rising at 1199 from 184. Patient wants to talk with his oncologist, talk with Ryan [name], he will try to see him soon. 10/25: Ryan [name] gone reordered a repeat PSA to confirm, we will see him after that resulted.     Closed left ankle and wrist fracture.  Secondary to fall.  Managed conservatively by orthopedic surgery.  Splint has been removed.  Able to work with PT and weightbearing.   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   Being followed up by oncology as an outpatient.  Patient received his Zometa today, next dose will be on November 18. Getting Lupron every 38-month, prior dose was approximately a month ago. Repeat PSA done yesterday by oncology with significant rise to 1199 from 184 a month ago. --started on Xtandi on 08/05/21. Plan: --cont p.o. Xtandi 160 mg daily indefinitely, per oncology --Continue monthly Zometa with his next dose due on approximately August 27, 2021.   Cancer  cachexia/protein caloric malnutrition, severe. -Encourage p.o. intake with supplements.   Anemia of chronic disease. -Stable   DVT prophylaxis: [id number]  Code Status: DNR  Family Communication:  Level of care: Med-Surg Dispo:   The patient is from: has been in the hospital for the past year Anticipated d/c is to: SNF Anticipated d/c date is: whenever bed available  Patient currently is medically ready to d/c.   Subjective and Interval History:  Pt again appeared lethargic.  No complaints.     Objective: Vitals:   08/05/21 2126 08/06/21 0549 08/06/21 0801 08/06/21 1101  BP: (!) 103/56 (!) 111/52 111/67 (!) 108/59  Pulse: 95 (!) 102 97 92  Resp: 18 18 16 20   Temp: (!) 97.5 F (36.4 C) 99.3 F (37.4 C) 100 F (37.8 C) 98.3 F (36.8 C)  TempSrc: Oral  Oral Oral  SpO2: 97% 96% 100% 96%  Weight:      Height:        Intake/Output Summary (Last 24 hours) at 08/06/2021 1436 Last data filed at 08/06/2021 1100 Gross per 24 hour  Intake 480 ml  Output 850 ml  Net -370 ml   Filed Weights   07/06/21 1406 07/07/21 1443 07/27/21 0500  Weight: 65.6 kg 62.5 kg 63.3 kg    Examination:   Constitutional: NAD, lethargic, oriented HEENT: conjunctivae and lids normal, EOMI CV: No cyanosis.   RESP: normal respiratory effort, on RA Neuro: II - XII grossly intact.   Psych: subdued mood and affect.     Data Reviewed: I have personally reviewed following  labs and imaging studies  CBC: Recent Labs  Lab 08/04/21 0140  WBC 5.7  HGB 8.1*  HCT 26.0*  MCV 79.8*  PLT 165   Basic Metabolic Panel: No results for input(s): NA, K, CL, CO2, GLUCOSE, BUN, CREATININE, CALCIUM, MG, PHOS in the last 168 hours.  GFR: Estimated Creatinine Clearance: 89 mL/min (by C-G formula based on SCr of 0.73 mg/dL). Liver Function Tests: No results for input(s): AST, ALT, ALKPHOS, BILITOT, PROT, ALBUMIN in the last 168 hours. No results for input(s): LIPASE, AMYLASE in the last 168 hours. No  results for input(s): AMMONIA in the last 168 hours. Coagulation Profile: No results for input(s): INR, PROTIME in the last 168 hours. Cardiac Enzymes: No results for input(s): CKTOTAL, CKMB, CKMBINDEX, TROPONINI in the last 168 hours. BNP (last 3 results) No results for input(s): PROBNP in the last 8760 hours. HbA1C: No results for input(s): HGBA1C in the last 72 hours. CBG: No results for input(s): GLUCAP in the last 168 hours. Lipid Profile: No results for input(s): CHOL, HDL, LDLCALC, TRIG, CHOLHDL, LDLDIRECT in the last 72 hours. Thyroid Function Tests: No results for input(s): TSH, T4TOTAL, FREET4, T3FREE, THYROIDAB in the last 72 hours. Anemia Panel: No results for input(s): VITAMINB12, FOLATE, FERRITIN, TIBC, IRON, RETICCTPCT in the last 72 hours. Sepsis Labs: No results for input(s): PROCALCITON, LATICACIDVEN in the last 168 hours.  No results found for this or any previous visit (from the past 240 hour(s)).    Radiology Studies: No results found.   Scheduled Meds:  calcium carbonate  1,250 mg Oral TID WC   enzalutamide  160 mg Oral Daily   feeding supplement  237 mL Oral TID BM   magnesium oxide  400 mg Oral Daily   midodrine  5 mg Oral TID WC   mirtazapine  15 mg Oral QHS   multivitamin with minerals  1 tablet Oral Daily   rivaroxaban  10 mg Oral Q supper   senna-docusate  1 tablet Oral BID   Continuous Infusions:  sodium chloride Stopped (06/04/21 1216)     LOS: 64 days     Ryan Bi, MD [facility] If 7PM-7AM, please contact night-coverage 08/06/2021, 2:36 PM

## 2021-08-07 NOTE — Progress Notes (Signed)
PROGRESS NOTE    Ryan [name]  WCB:[id number] DOB: 12-27-1960 DOA: 06/02/2021 PCP: Pcp, No  150A/150A-AA   Assessment & Plan:   Principal Problem:   Closed left ankle fracture Active Problems:   Fall   Anemia of chronic disease   Protein-calorie malnutrition, severe (HCC)   Cancer cachexia (HCC)   Prostate cancer metastatic to bone (HCC)   Left wrist fracture, closed, initial encounter   Hypocalcemia   Hyperkalemia   Closed fracture of left distal radius   Closed bimalleolar fracture of left ankle   60 year old man PMH metastatic prostate cancer presented with a fall resulting in closed left ankle fracture and left wrist fracture.  This was treated with conservative management.  Hospitalization uncomplicated.  Discharge delayed by difficulty with disposition.  Remains medically stable for discharge.  Accepting facility has some concern about the start date of his disability.   Patient received dose of Zometa on 07/30/2021.  Oncology repeated the PSA which seems rising at 1199 from 184. Patient wants to talk with his oncologist, talk with [name], he will try to see him soon. 10/25: Dr. Marc Morgans gone reordered a repeat PSA to confirm, we will see him after that resulted.     Closed left ankle and wrist fracture.  Secondary to fall.  Managed conservatively by orthopedic surgery.  Splint has been removed.  Able to work with PT and weightbearing.   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   Being followed up by oncology as an outpatient.  Patient received his Zometa today, next dose will be on November 18. Getting Lupron every 47-month, prior dose was approximately a month ago. Repeat PSA done yesterday by oncology with significant rise to 1199 from 184 a month ago. --started on Xtandi on 08/05/21. Plan: --cont p.o. Xtandi 160 mg daily indefinitely, per oncology --Continue monthly Zometa with his next dose due on approximately August 27, 2021.   Cancer  cachexia/protein caloric malnutrition, severe. -Encourage p.o. intake with supplements.   Anemia of chronic disease. -Stable   DVT prophylaxis: [id number]  Code Status: DNR  Family Communication:  Level of care: Med-Surg Dispo:   The patient is from: has been in the hospital for the past year Anticipated d/c is to: SNF Anticipated d/c date is: whenever bed available  Patient currently is medically ready to d/c.   Subjective and Interval History:  Continued to be lethargic and sleeping during rounds.    Objective: Vitals:   08/07/21 0450 08/07/21 0500 08/07/21 0816 08/07/21 1213  BP: 103/64  105/64 (!) 100/53  Pulse: 82  86 81  Resp: 16  18 16   Temp: 98.4 F (36.9 C)  97.8 F (36.6 C)   TempSrc: Oral     SpO2: 97%  98% 96%  Weight:  60.1 kg    Height:        Intake/Output Summary (Last 24 hours) at 08/07/2021 1501 Last data filed at 08/07/2021 0600 Gross per 24 hour  Intake --  Output 600 ml  Net -600 ml   Filed Weights   07/27/21 0500 08/06/21 0500 08/07/21 0500  Weight: 63.3 kg 60.5 kg 60.1 kg    Examination:   Constitutional: NAD, sleeping, arousable CV: No cyanosis.   RESP: normal respiratory effort, on RA Extremities: No effusions, edema in BLE   Data Reviewed: I have personally reviewed following labs and imaging studies  CBC: Recent Labs  Lab 08/04/21 0140  WBC 5.7  HGB 8.1*  HCT 26.0*  MCV 79.8*  PLT 016   Basic Metabolic Panel: No results for input(s): NA, K, CL, CO2, GLUCOSE, BUN, CREATININE, CALCIUM, MG, PHOS in the last 168 hours.  GFR: Estimated Creatinine Clearance: 84.5 mL/min (by C-G formula based on SCr of 0.73 mg/dL). Liver Function Tests: No results for input(s): AST, ALT, ALKPHOS, BILITOT, PROT, ALBUMIN in the last 168 hours. No results for input(s): LIPASE, AMYLASE in the last 168 hours. No results for input(s): AMMONIA in the last 168 hours. Coagulation Profile: No results for input(s): INR, PROTIME in the last 168  hours. Cardiac Enzymes: No results for input(s): CKTOTAL, CKMB, CKMBINDEX, TROPONINI in the last 168 hours. BNP (last 3 results) No results for input(s): PROBNP in the last 8760 hours. HbA1C: No results for input(s): HGBA1C in the last 72 hours. CBG: No results for input(s): GLUCAP in the last 168 hours. Lipid Profile: No results for input(s): CHOL, HDL, LDLCALC, TRIG, CHOLHDL, LDLDIRECT in the last 72 hours. Thyroid Function Tests: No results for input(s): TSH, T4TOTAL, FREET4, T3FREE, THYROIDAB in the last 72 hours. Anemia Panel: No results for input(s): VITAMINB12, FOLATE, FERRITIN, TIBC, IRON, RETICCTPCT in the last 72 hours. Sepsis Labs: No results for input(s): PROCALCITON, LATICACIDVEN in the last 168 hours.  No results found for this or any previous visit (from the past 240 hour(s)).    Radiology Studies: No results found.   Scheduled Meds:  calcium carbonate  1,250 mg Oral TID WC   enzalutamide  160 mg Oral Daily   feeding supplement  237 mL Oral TID BM   magnesium oxide  400 mg Oral Daily   midodrine  5 mg Oral TID WC   mirtazapine  15 mg Oral QHS   multivitamin with minerals  1 tablet Oral Daily   rivaroxaban  10 mg Oral Q supper   senna-docusate  1 tablet Oral BID   Continuous Infusions:  sodium chloride Stopped (06/04/21 1216)     LOS: 89 days     Enzo Bi, MD [facility] If 7PM-7AM, please contact night-coverage 08/07/2021, 3:01 PM

## 2021-08-08 DIAGNOSIS — R5381 Other malaise: Secondary | ICD-10-CM

## 2021-08-08 LAB — CBC
HCT: 23.5 % — ABNORMAL LOW (ref 39.0–52.0)
Hemoglobin: 7.8 g/dL — ABNORMAL LOW (ref 13.0–17.0)
MCH: 25.6 pg — ABNORMAL LOW (ref 26.0–34.0)
MCHC: 33.2 g/dL (ref 30.0–36.0)
MCV: 77 fL — ABNORMAL LOW (ref 80.0–100.0)
Platelets: 282 10*3/uL (ref 150–400)
RBC: 3.05 MIL/uL — ABNORMAL LOW (ref 4.22–5.81)
RDW: 13.5 % (ref 11.5–15.5)
WBC: 6 10*3/uL (ref 4.0–10.5)
nRBC: 0 % (ref 0.0–0.2)

## 2021-08-08 LAB — COMPREHENSIVE METABOLIC PANEL
ALT: 7 U/L (ref 0–44)
AST: 32 U/L (ref 15–41)
Albumin: 3.5 g/dL (ref 3.5–5.0)
Alkaline Phosphatase: 1000 U/L — ABNORMAL HIGH (ref 38–126)
Anion gap: 6 (ref 5–15)
BUN: 9 mg/dL (ref 6–20)
CO2: 21 mmol/L — ABNORMAL LOW (ref 22–32)
Calcium: 8.2 mg/dL — ABNORMAL LOW (ref 8.9–10.3)
Chloride: 103 mmol/L (ref 98–111)
Creatinine, Ser: 0.69 mg/dL (ref 0.61–1.24)
GFR, Estimated: >60 mL/min (ref >60)
Glucose, Bld: 123 mg/dL — ABNORMAL HIGH (ref 70–99)
Potassium: 4.3 mmol/L (ref 3.5–5.1)
Sodium: 130 mmol/L — ABNORMAL LOW (ref 135–145)
Total Bilirubin: 0.7 mg/dL (ref 0.3–1.2)
Total Protein: 6.7 g/dL (ref 6.5–8.1)

## 2021-08-08 LAB — MAGNESIUM: Magnesium: 1.8 mg/dL (ref 1.7–2.4)

## 2021-08-08 MED ORDER — QUETIAPINE FUMARATE 25 MG PO TABS
75.0000 mg | ORAL_TABLET | Freq: Every day | ORAL | Status: DC
  Administered 2021-08-08 – 2021-08-09 (×2): 75 mg via ORAL
  Filled 2021-08-08 (×2): qty 3

## 2021-08-08 NOTE — Progress Notes (Signed)
PROGRESS NOTE    Ryan [name]  QMV:[id number] DOB: 02-09-1961 DOA: 06/02/2021 PCP: Pcp, No  150A/150A-AA   Assessment & Plan:   Principal Problem:   Closed left ankle fracture Active Problems:   Fall   Anemia of chronic disease   Protein-calorie malnutrition, severe (HCC)   Cancer cachexia (HCC)   Prostate cancer metastatic to bone (HCC)   Left wrist fracture, closed, initial encounter   Hypocalcemia   Hyperkalemia   Closed fracture of left distal radius   Closed bimalleolar fracture of left ankle   60 year old man PMH metastatic prostate cancer presented with a fall resulting in closed left ankle fracture and left wrist fracture.  This was treated with conservative management.  Hospitalization uncomplicated.  Discharge delayed by difficulty with disposition.  Remains medically stable for discharge.  Accepting facility has some concern about the start date of his disability.   Patient received dose of Zometa on 07/30/2021.  Oncology repeated the PSA which seems rising at 1199 from 184. Patient wants to talk with his oncologist, talk with [name], he will try to see him soon. 10/25: Dr. Marc Morgans gone reordered a repeat PSA to confirm, we will see him after that resulted.     Closed left ankle and wrist fracture.  Secondary to fall.  Managed conservatively by orthopedic surgery.  Splint has been removed.  Able to work with PT and weightbearing.   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   Being followed up by oncology as an outpatient.  Patient received his Zometa today, next dose will be on November 18. Getting Lupron every 10-month, prior dose was approximately a month ago. Repeat PSA done yesterday by oncology with significant rise to 1199 from 184 a month ago. --started on Xtandi on 08/05/21. Plan: --cont p.o. Xtandi 160 mg daily indefinitely, per oncology --Continue monthly Zometa with his next dose due on approximately August 27, 2021.   Cancer  cachexia/protein caloric malnutrition, severe. -Encourage p.o. intake with supplements.   Anemia of chronic disease. -Stable   DVT prophylaxis: [id number]  Code Status: DNR  Family Communication:  Level of care: Med-Surg Dispo:   The patient is from: has been in the hospital for the past year Anticipated d/c is to: SNF Anticipated d/c date is: whenever bed available  Patient currently is medically ready to d/c.   Subjective and Interval History:  Pt reported not being able to sleep last night, and trazodone had lost its effect.  Complained of left shoulder stiffness.  Not feeling well and decreased appetite, which pt attributed to the new chemo.   Objective: Vitals:   08/07/21 1213 08/07/21 1741 08/07/21 2206 08/08/21 0453  BP: (!) 100/53 110/61 (!) 118/58 (!) 124/58  Pulse: 81 80 75 87  Resp: 16 20 19 18   Temp:  98 F (36.7 C) 98.1 F (36.7 C) 98.1 F (36.7 C)  TempSrc:  Oral    SpO2: 96% 100% 99% 99%  Weight:      Height:        Intake/Output Summary (Last 24 hours) at 08/08/2021 1450 Last data filed at 08/08/2021 0941 Gross per 24 hour  Intake --  Output 1300 ml  Net -1300 ml   Filed Weights   07/27/21 0500 08/06/21 0500 08/07/21 0500  Weight: 63.3 kg 60.5 kg 60.1 kg    Examination:  Constitutional: NAD, AAOx3 HEENT: conjunctivae and lids normal, EOMI CV: No cyanosis.   RESP: normal respiratory effort, on RA Extremities: No effusions, edema in BLE  Neuro: II - XII grossly intact.   Psych: depressed mood and affect.     Data Reviewed: I have personally reviewed following labs and imaging studies  CBC: Recent Labs  Lab 08/04/21 0140 08/08/21 1018  WBC 5.7 6.0  HGB 8.1* 7.8*  HCT 26.0* 23.5*  MCV 79.8* 77.0*  PLT 288 269   Basic Metabolic Panel: Recent Labs  Lab 08/08/21 1018  NA 130*  K 4.3  CL 103  CO2 21*  GLUCOSE 123*  BUN 9  CREATININE 0.69  CALCIUM 8.2*  MG 1.8    GFR: Estimated Creatinine Clearance: 84.5 mL/min (by C-G  formula based on SCr of 0.69 mg/dL). Liver Function Tests: Recent Labs  Lab 08/08/21 1018  AST 32  ALT 7  ALKPHOS 1,000*  BILITOT 0.7  PROT 6.7  ALBUMIN 3.5   No results for input(s): LIPASE, AMYLASE in the last 168 hours. No results for input(s): AMMONIA in the last 168 hours. Coagulation Profile: No results for input(s): INR, PROTIME in the last 168 hours. Cardiac Enzymes: No results for input(s): CKTOTAL, CKMB, CKMBINDEX, TROPONINI in the last 168 hours. BNP (last 3 results) No results for input(s): PROBNP in the last 8760 hours. HbA1C: No results for input(s): HGBA1C in the last 72 hours. CBG: No results for input(s): GLUCAP in the last 168 hours. Lipid Profile: No results for input(s): CHOL, HDL, LDLCALC, TRIG, CHOLHDL, LDLDIRECT in the last 72 hours. Thyroid Function Tests: No results for input(s): TSH, T4TOTAL, FREET4, T3FREE, THYROIDAB in the last 72 hours. Anemia Panel: No results for input(s): VITAMINB12, FOLATE, FERRITIN, TIBC, IRON, RETICCTPCT in the last 72 hours. Sepsis Labs: No results for input(s): PROCALCITON, LATICACIDVEN in the last 168 hours.  No results found for this or any previous visit (from the past 240 hour(s)).    Radiology Studies: No results found.   Scheduled Meds:  calcium carbonate  1,250 mg Oral TID WC   enzalutamide  160 mg Oral Daily   feeding supplement  237 mL Oral TID BM   magnesium oxide  400 mg Oral Daily   midodrine  5 mg Oral TID WC   mirtazapine  15 mg Oral QHS   multivitamin with minerals  1 tablet Oral Daily   QUEtiapine  75 mg Oral QHS   rivaroxaban  10 mg Oral Q supper   senna-docusate  1 tablet Oral BID   Continuous Infusions:  sodium chloride Stopped (06/04/21 1216)     LOS: 66 days     Enzo Bi, MD [facility] If 7PM-7AM, please contact night-coverage 08/08/2021, 2:50 PM

## 2021-08-08 NOTE — Progress Notes (Signed)
Pt called out to request oxygen at 0100 stating that he couldn't breathe; 02 sats 100% on RA.  Pt provided reassurance; no further complaints at this time.

## 2021-08-09 ENCOUNTER — Other Ambulatory Visit (HOSPITAL_COMMUNITY): Payer: Self-pay

## 2021-08-09 ENCOUNTER — Telehealth: Payer: Self-pay | Admitting: Pharmacist

## 2021-08-09 DIAGNOSIS — C7951 Secondary malignant neoplasm of bone: Secondary | ICD-10-CM

## 2021-08-09 DIAGNOSIS — C61 Malignant neoplasm of prostate: Secondary | ICD-10-CM

## 2021-08-09 MED ORDER — ENZALUTAMIDE 40 MG PO TABS
160.0000 mg | ORAL_TABLET | Freq: Every day | ORAL | Status: AC
  Administered 2021-08-10 – 2021-08-29 (×20): 160 mg via ORAL
  Filled 2021-08-09 (×21): qty 4

## 2021-08-09 MED ORDER — ENZALUTAMIDE 40 MG PO TABS
160.0000 mg | ORAL_TABLET | Freq: Every day | ORAL | 2 refills | Status: DC
  Filled 2021-08-09: qty 120, 30d supply, fill #0

## 2021-08-09 NOTE — Progress Notes (Signed)
OT Cancellation Note  Patient Details Name: Ryan [name] MRN: [id number] DOB: Feb 14, 1961   Cancelled Treatment:    Reason Eval/Treat Not Completed: Other (comment) (pt declined participation in all provided skilled OT tasks, reporting "I really need to take a nap right now". WIll-reattempt as able.Shanon Payor, OTD OTR/L  08/09/21, 2:50 PM

## 2021-08-09 NOTE — Progress Notes (Signed)
PROGRESS NOTE    Ryan [name]  LDJ:[id number] DOB: 10-11-1960 DOA: 06/02/2021 PCP: Pcp, No  150A/150A-AA   Assessment & Plan:   Principal Problem:   Closed left ankle fracture Active Problems:   Fall   Anemia of chronic disease   Protein-calorie malnutrition, severe (HCC)   Cancer cachexia (HCC)   Prostate cancer metastatic to bone (HCC)   Left wrist fracture, closed, initial encounter   Hypocalcemia   Hyperkalemia   Closed fracture of left distal radius   Closed bimalleolar fracture of left ankle   60 year old man PMH metastatic prostate cancer presented with a fall resulting in closed left ankle fracture and left wrist fracture.  This was treated with conservative management.  Hospitalization uncomplicated.  Discharge delayed by difficulty with disposition.  Remains medically stable for discharge.  Accepting facility has some concern about the start date of his disability.   Patient received dose of Zometa on 07/30/2021.  Oncology repeated the PSA which seems rising at 1199 from 184. Patient wants to talk with his oncologist, talk with [name], he will try to see him soon. 10/25: Dr. Marc Morgans gone reordered a repeat PSA to confirm, we will see him after that resulted.     Closed left ankle and wrist fracture.  Secondary to fall.  Managed conservatively by orthopedic surgery.  Splint has been removed.  Able to work with PT and weightbearing.   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   Being followed up by oncology as an outpatient.  Patient received his Zometa today, next dose will be on November 18. Getting Lupron every 58-month, prior dose was approximately a month ago. Repeat PSA done yesterday by oncology with significant rise to 1199 from 184 a month ago. --started on Xtandi on 08/05/21. Plan: --cont p.o. Xtandi 160 mg daily indefinitely, per oncology --Continue monthly Zometa with his next dose due on approximately August 27, 2021.   Cancer  cachexia/protein caloric malnutrition, severe. -Encourage p.o. intake with supplements.   Anemia of chronic disease. -Stable   DVT prophylaxis: [id number]  Code Status: DNR  Family Communication:  Level of care: Med-Surg Dispo:   The patient is from: has been in the hospital for the past year Anticipated d/c is to: SNF Anticipated d/c date is: whenever bed available  Patient currently is medically ready to d/c.   Subjective and Interval History:  Per nursing, pt reported not being able to sleep last night even with addition of new seroquel to trazodone.    Later during round, pt was found to be sleeping, as is often the case during the day.   Objective: Vitals:   08/08/21 2046 08/09/21 0756 08/09/21 1658 08/09/21 2051  BP: 99/63 (!) 121/51 (!) 104/53 (!) 97/56  Pulse: 80 87 84 81  Resp: 16 18 15 16   Temp: 98.5 F (36.9 C) 98.1 F (36.7 C) 98 F (36.7 C) 98.2 F (36.8 C)  TempSrc:    Oral  SpO2: 99% 97% 99% 99%  Weight:      Height:        Intake/Output Summary (Last 24 hours) at 08/09/2021 2239 Last data filed at 08/09/2021 0200 Gross per 24 hour  Intake --  Output 850 ml  Net -850 ml   Filed Weights   07/27/21 0500 08/06/21 0500 08/07/21 0500  Weight: 63.3 kg 60.5 kg 60.1 kg    Examination:   Constitutional: NAD, sleeping, arousable, but went right back to sleep CV: No cyanosis.   RESP: normal respiratory  effort, on RA Extremities: No effusions, edema in BLE   Data Reviewed: I have personally reviewed following labs and imaging studies  CBC: Recent Labs  Lab 08/04/21 0140 08/08/21 1018  WBC 5.7 6.0  HGB 8.1* 7.8*  HCT 26.0* 23.5*  MCV 79.8* 77.0*  PLT 288 967   Basic Metabolic Panel: Recent Labs  Lab 08/08/21 1018  NA 130*  K 4.3  CL 103  CO2 21*  GLUCOSE 123*  BUN 9  CREATININE 0.69  CALCIUM 8.2*  MG 1.8    GFR: Estimated Creatinine Clearance: 84.5 mL/min (by C-G formula based on SCr of 0.69 mg/dL). Liver Function  Tests: Recent Labs  Lab 08/08/21 1018  AST 32  ALT 7  ALKPHOS 1,000*  BILITOT 0.7  PROT 6.7  ALBUMIN 3.5   No results for input(s): LIPASE, AMYLASE in the last 168 hours. No results for input(s): AMMONIA in the last 168 hours. Coagulation Profile: No results for input(s): INR, PROTIME in the last 168 hours. Cardiac Enzymes: No results for input(s): CKTOTAL, CKMB, CKMBINDEX, TROPONINI in the last 168 hours. BNP (last 3 results) No results for input(s): PROBNP in the last 8760 hours. HbA1C: No results for input(s): HGBA1C in the last 72 hours. CBG: No results for input(s): GLUCAP in the last 168 hours. Lipid Profile: No results for input(s): CHOL, HDL, LDLCALC, TRIG, CHOLHDL, LDLDIRECT in the last 72 hours. Thyroid Function Tests: No results for input(s): TSH, T4TOTAL, FREET4, T3FREE, THYROIDAB in the last 72 hours. Anemia Panel: No results for input(s): VITAMINB12, FOLATE, FERRITIN, TIBC, IRON, RETICCTPCT in the last 72 hours. Sepsis Labs: No results for input(s): PROCALCITON, LATICACIDVEN in the last 168 hours.  No results found for this or any previous visit (from the past 240 hour(s)).    Radiology Studies: No results found.   Scheduled Meds:  calcium carbonate  1,250 mg Oral TID WC   [START ON 08/10/2021] enzalutamide  160 mg Oral Daily   feeding supplement  237 mL Oral TID BM   magnesium oxide  400 mg Oral Daily   midodrine  5 mg Oral TID WC   mirtazapine  15 mg Oral QHS   multivitamin with minerals  1 tablet Oral Daily   QUEtiapine  75 mg Oral QHS   rivaroxaban  10 mg Oral Q supper   senna-docusate  1 tablet Oral BID   Continuous Infusions:  sodium chloride Stopped (06/04/21 1216)     LOS: 67 days     Enzo Bi, MD [facility] If 7PM-7AM, please contact night-coverage 08/09/2021, 10:39 PM

## 2021-08-09 NOTE — Telephone Encounter (Signed)
Oral Chemotherapy Pharmacist Encounter   Received a call from inpatient unit 1A charge RN Hildred Alamin that Mr, Ryan [name] was having trouble swallowing the Xtandi 80mg  tablets. There is no available information on the ability to spilt the tablets in half. We will switch [name] to 40mg  tablets which are smaller and hopefully easier to swallow.   Darl Pikes, PharmD, BCPS, BCOP, CPP Hematology/Oncology Clinical Pharmacist [facility] [phone]  08/09/2021 10:55 AM

## 2021-08-09 NOTE — Progress Notes (Signed)
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  Pt recently here for multiple months with difficult d/c planning, did ultimatley d/c to homeless shelter and was gone only a few hours before fall with L ankle and wrist fractures with return to the ED.    PT Comments    Patient agreeable to PT treatment. He is making progress with functional independence and increased activity tolerance this session. Patient participated in seated LE therapeutic exercises for strengthening. Patient increased ambulation distance to 108 ft with platform rolling walker with Min guard assistance. Unable to ambulate further due to fatigue. Sp02 100% on room air immediately after walking, heart rate up to 121bpm. Recommend to continue PT to maximize independence and facilitate return to prior level of function.    Recommendations for follow up therapy are one component of a multi-disciplinary discharge planning process, led by the attending physician.  Recommendations may be updated based on patient status, additional functional criteria and insurance authorization.  Follow Up Recommendations  Home health PT     Assistance Recommended at Discharge Intermittent Supervision/Assistance  Equipment Recommendations   (left platform rolling walker)    Recommendations for Other Services       Precautions / Restrictions Precautions Precautions: Fall Precaution Comments: WBAT Restrictions Weight Bearing Restrictions: Yes LUE Weight Bearing: Weight bearing as tolerated LLE Weight Bearing: Weight bearing as tolerated     Mobility  Bed Mobility Overal bed mobility: Needs Assistance        Supine to sit: Modified independent (Device/Increase time);HOB elevated Sit to supine: Supervision   General bed mobility comments: increased time and effort    Transfers Overall transfer level: Needs assistance Equipment used: Rolling walker (2 wheels);Left platform walker Transfers: Sit to/from Stand Sit to Stand: Min assist;From elevated surface           General transfer comment: verbal cues for technique and task initiation    Ambulation/Gait Ambulation/Gait assistance: Min guard Gait Distance (Feet): 108 Feet Assistive device: Rolling walker (2 wheels);Left platform walker Gait Pattern/deviations: Step-through pattern;Narrow base of support;Trunk flexed Gait velocity: decreased   General Gait Details: verbal cues for technique, energy conservation techniques. Min guard for safety with no gross loss of balance. mild dizziness is reported after walking. Sp02 100% on room air and heart rate 121bpm. unable to ambulate further due to fatigue with activity   Stairs             Wheelchair Mobility    Modified Rankin (Stroke Patients Only)       Balance                                            Cognition Arousal/Alertness: Awake/alert Behavior During Therapy: WFL for tasks assessed/performed Overall Cognitive Status: Within Functional Limits for tasks assessed                                 General Comments: patient able to follow commands without difficulty. needs redirection for attention to task as patient is easily distracted  Exercises General Exercises - Lower Extremity Ankle Circles/Pumps: AAROM;Strengthening;Both;20 reps;Seated (2 sets of 10 reps) Long Arc Quad: AAROM;Strengthening;Both;20 reps;Seated (2 sets of 10 reps) Hip Flexion/Marching: AAROM;Strengthening;Both;20 reps;Seated (2 sets of 10 reps) Other Exercises Other Exercises: verbal and tactile cues for technique for therapeutic exercises for  strengthening of LE    General Comments        Pertinent Vitals/Pain Pain Assessment: No/denies pain    Home Living                          Prior Function            PT Goals (current goals can now be found in the care plan section) Acute Rehab PT Goals Patient Stated Goal: to get stronger PT Goal Formulation: With patient Time For Goal Achievement: 08/11/21 Potential to Achieve Goals: Fair Progress towards PT goals: Progressing toward goals    Frequency    Min 2X/week      PT Plan Current plan remains appropriate    Co-evaluation              AM-PAC PT "6 Clicks" Mobility   Outcome Measure  Help needed turning from your back to your side while in a flat bed without using bedrails?: None Help needed moving from lying on your back to sitting on the side of a flat bed without using bedrails?: None Help needed moving to and from a bed to a chair (including a wheelchair)?: A Little Help needed standing up from a chair using your arms (e.g., wheelchair or bedside chair)?: A Little Help needed to walk in hospital room?: A Little Help needed climbing 3-5 steps with a railing? : A Lot 6 Click Score: 19    End of Session Equipment Utilized During Treatment: Gait belt Activity Tolerance: Patient tolerated treatment well Patient left: in bed;with call bell/phone within reach Nurse Communication: Mobility status PT Visit Diagnosis: Unsteadiness on feet (R26.81);Muscle weakness (generalized) (M62.81);Difficulty in walking, not elsewhere classified (R26.2);History of falling (Z91.81)     Time: 7416-3845 PT Time Calculation (min) (ACUTE ONLY): 62 min  Charges:  $Gait Training: 23-37 mins $Therapeutic Exercise: 8-22 mins $Therapeutic Activity: 8-22 mins                     Ryan [name], PT, MPT    [name] 08/09/2021, 11:42 AM

## 2021-08-10 MED ORDER — QUETIAPINE FUMARATE 25 MG PO TABS
100.0000 mg | ORAL_TABLET | Freq: Every day | ORAL | Status: DC
  Administered 2021-08-10 – 2021-08-30 (×21): 100 mg via ORAL
  Filled 2021-08-10 (×21): qty 4

## 2021-08-10 NOTE — Progress Notes (Signed)
PROGRESS NOTE    Ryan [name]  EHU:[id number] DOB: 26-Aug-1961 DOA: 06/02/2021 PCP: Pcp, No  150A/150A-AA   Assessment & Plan:   Principal Problem:   Closed left ankle fracture Active Problems:   Fall   Anemia of chronic disease   Protein-calorie malnutrition, severe (HCC)   Cancer cachexia (HCC)   Prostate cancer metastatic to bone (HCC)   Left wrist fracture, closed, initial encounter   Hypocalcemia   Hyperkalemia   Closed fracture of left distal radius   Closed bimalleolar fracture of left ankle   60 year old man PMH metastatic prostate cancer presented with a fall resulting in closed left ankle fracture and left wrist fracture.  This was treated with conservative management.  Hospitalization uncomplicated.  Discharge delayed by difficulty with disposition.  Remains medically stable for discharge.  Accepting facility has some concern about the start date of his disability.   Patient received dose of Zometa on 07/30/2021.  Oncology repeated the PSA which seems rising at 1199 from 184. Patient wants to talk with his oncologist, talk with [name], he will try to see him soon. 10/25: Dr. Marc Morgans gone reordered a repeat PSA to confirm, we will see him after that resulted.     Closed left ankle and wrist fracture.  Secondary to fall.  Managed conservatively by orthopedic surgery.  Splint has been removed.  Able to work with PT and weightbearing.   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   Being followed up by oncology as an outpatient.  Patient received his Zometa today, next dose will be on November 18. Getting Lupron every 31-month, prior dose was approximately a month ago. Repeat PSA done yesterday by oncology with significant rise to 1199 from 184 a month ago. --started on Xtandi on 08/05/21. Plan: --cont p.o. Xtandi 160 mg daily indefinitely, per oncology --Continue monthly Zometa with his next dose due on approximately August 27, 2021.   Cancer  cachexia/protein caloric malnutrition, severe. -Encourage p.o. intake with supplements.   Anemia of chronic disease. -Stable  Insomnia --Pt often complains of not being able to sleep at night.  Pt is noted to be sleeping a lot during the day.  Nightly trazodone had stopped working.  Switched to seroquel nightly. --cont seroquel 100 mg nightly for now.   DVT prophylaxis: ZC:HYIFOYD  Code Status: DNR  Family Communication:  Level of care: Med-Surg Dispo:   The patient is from: has been in the hospital for the past year Anticipated d/c is to: SNF Anticipated d/c date is: whenever bed available  Patient currently is medically ready to d/c.   Subjective and Interval History:  More awake today.  Michela Pitcher he is eager to work with PT/OT.   Objective: Vitals:   08/10/21 0501 08/10/21 0752 08/10/21 1100 08/10/21 1547  BP: (!) 99/50 107/60 (!) 116/57 (!) 114/58  Pulse: 83 92 86 83  Resp: 16 18 18 14   Temp: 98.2 F (36.8 C) 98.3 F (36.8 C) 99.3 F (37.4 C) 97.6 F (36.4 C)  TempSrc: Oral Oral Oral   SpO2: 97% 97% 98% 98%  Weight:      Height:        Intake/Output Summary (Last 24 hours) at 08/10/2021 1931 Last data filed at 08/10/2021 1020 Gross per 24 hour  Intake 480 ml  Output 350 ml  Net 130 ml   Filed Weights   07/27/21 0500 08/06/21 0500 08/07/21 0500  Weight: 63.3 kg 60.5 kg 60.1 kg    Examination:   Constitutional:  NAD, AAOx3 HEENT: conjunctivae and lids normal, EOMI CV: No cyanosis.   RESP: normal respiratory effort, on RA Extremities: No effusions, edema in BLE Neuro: II - XII grossly intact.     Data Reviewed: I have personally reviewed following labs and imaging studies  CBC: Recent Labs  Lab 08/04/21 0140 08/08/21 1018  WBC 5.7 6.0  HGB 8.1* 7.8*  HCT 26.0* 23.5*  MCV 79.8* 77.0*  PLT 288 518   Basic Metabolic Panel: Recent Labs  Lab 08/08/21 1018  NA 130*  K 4.3  CL 103  CO2 21*  GLUCOSE 123*  BUN 9  CREATININE 0.69  CALCIUM 8.2*   MG 1.8    GFR: Estimated Creatinine Clearance: 84.5 mL/min (by C-G formula based on SCr of 0.69 mg/dL). Liver Function Tests: Recent Labs  Lab 08/08/21 1018  AST 32  ALT 7  ALKPHOS 1,000*  BILITOT 0.7  PROT 6.7  ALBUMIN 3.5   No results for input(s): LIPASE, AMYLASE in the last 168 hours. No results for input(s): AMMONIA in the last 168 hours. Coagulation Profile: No results for input(s): INR, PROTIME in the last 168 hours. Cardiac Enzymes: No results for input(s): CKTOTAL, CKMB, CKMBINDEX, TROPONINI in the last 168 hours. BNP (last 3 results) No results for input(s): PROBNP in the last 8760 hours. HbA1C: No results for input(s): HGBA1C in the last 72 hours. CBG: No results for input(s): GLUCAP in the last 168 hours. Lipid Profile: No results for input(s): CHOL, HDL, LDLCALC, TRIG, CHOLHDL, LDLDIRECT in the last 72 hours. Thyroid Function Tests: No results for input(s): TSH, T4TOTAL, FREET4, T3FREE, THYROIDAB in the last 72 hours. Anemia Panel: No results for input(s): VITAMINB12, FOLATE, FERRITIN, TIBC, IRON, RETICCTPCT in the last 72 hours. Sepsis Labs: No results for input(s): PROCALCITON, LATICACIDVEN in the last 168 hours.  No results found for this or any previous visit (from the past 240 hour(s)).    Radiology Studies: No results found.   Scheduled Meds:  calcium carbonate  1,250 mg Oral TID WC   enzalutamide  160 mg Oral Daily   feeding supplement  237 mL Oral TID BM   magnesium oxide  400 mg Oral Daily   midodrine  5 mg Oral TID WC   mirtazapine  15 mg Oral QHS   multivitamin with minerals  1 tablet Oral Daily   QUEtiapine  100 mg Oral QHS   rivaroxaban  10 mg Oral Q supper   senna-docusate  1 tablet Oral BID   Continuous Infusions:  sodium chloride Stopped (06/04/21 1216)     LOS: 68 days     Enzo Bi, MD [facility] If 7PM-7AM, please contact night-coverage 08/10/2021, 7:31 PM

## 2021-08-10 NOTE — Progress Notes (Signed)
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  Pt recently here for multiple months with difficult d/c planning, did ultimatley d/c to homeless shelter and was gone only a few hours before fall with L ankle and wrist fractures with return to the ED.    PT Comments    Patient continued to make progress towards meeting PT goals. He consistently needs assistance for sit to stand transfers, but is able to ambulate in hallway with platform walker with Min guard assistance. Activity tolerance limited by fatigue. Recommend to continue PT to maximize independence and facilitate return to prior level of function.    Recommendations for follow up therapy are one component of a multi-disciplinary discharge planning process, led by the attending physician.  Recommendations may be updated based on patient status, additional functional criteria and insurance authorization.  Follow Up Recommendations  Home health PT     Assistance Recommended at Discharge Intermittent Supervision/Assistance  Equipment Recommendations  Other (comment) (ongoing assessment)    Recommendations for Other Services       Precautions / Restrictions Precautions Precautions: Fall Precaution Comments: WBAT Restrictions Weight Bearing Restrictions: Yes LUE Weight Bearing: Weight bearing as tolerated LLE Weight Bearing: Weight bearing as tolerated     Mobility  Bed Mobility Overal bed mobility: Needs Assistance Bed Mobility: Supine to Sit;Sit to Supine     Supine to sit: Supervision;HOB elevated Sit to supine: Supervision;HOB elevated   General bed mobility comments: increased time and effort  required    Transfers Overall transfer level: Needs assistance Equipment used: Rolling walker (2 wheels);Left platform walker Transfers: Sit to/from Stand;Stand Pivot Transfers Sit to Stand: Min assist;From elevated surface Stand pivot transfers: Min guard         General transfer comment: verbal cues for technique. lifting assistance required for standing with bed height elevated    Ambulation/Gait Ambulation/Gait assistance: Min guard Gait Distance (Feet): 75 Feet Assistive device: Rolling walker (2 wheels);Left platform walker Gait Pattern/deviations: Step-through pattern;Narrow base of support;Trunk flexed Gait velocity: decreased   General Gait Details: verbal cues for technique using rolling walker for support. no loss of balance. activity tolerance limited by fatigue   Stairs             Wheelchair Mobility    Modified Rankin (Stroke Patients Only)       Balance Overall balance assessment: Needs assistance Sitting-balance support: Feet supported;No upper extremity supported Sitting balance-Leahy Scale: Good Sitting balance - Comments: Good sitting balance while engaged in fxl tasks seated EOB   Standing balance support: Bilateral upper extremity supported;During functional activity Standing balance-Leahy Scale: Fair Standing balance comment: platform L side                            Cognition Arousal/Alertness: Awake/alert Behavior During Therapy: WFL for tasks assessed/performed Overall Cognitive Status: Within Functional Limits for tasks assessed                                 General Comments: alert and oriented, follows 1 step directives however requires frequent vcs for attention to task  Exercises General Exercises - Upper Extremity Elbow Flexion: Strengthening;10 reps;Theraband;Both Theraband Level (Elbow Flexion): Level 1 (Yellow) Elbow Extension: Strengthening;10  reps;Limitations;Theraband;Both Theraband Level (Elbow Extension): Level 1 (Yellow) Elbow Extension Limitations: adaptations for L wrist due to discomfort    General Comments        Pertinent Vitals/Pain Pain Assessment: No/denies pain    Home Living                          Prior Function            PT Goals (current goals can now be found in the care plan section) Acute Rehab PT Goals Patient Stated Goal: to get stronger PT Goal Formulation: With patient Time For Goal Achievement: 08/11/21 Potential to Achieve Goals: Fair Progress towards PT goals: Progressing toward goals    Frequency           PT Plan Current plan remains appropriate    Co-evaluation              AM-PAC PT "6 Clicks" Mobility   Outcome Measure  Help needed turning from your back to your side while in a flat bed without using bedrails?: None Help needed moving from lying on your back to sitting on the side of a flat bed without using bedrails?: None Help needed moving to and from a bed to a chair (including a wheelchair)?: A Little Help needed standing up from a chair using your arms (e.g., wheelchair or bedside chair)?: A Little Help needed to walk in hospital room?: A Little Help needed climbing 3-5 steps with a railing? : A Lot 6 Click Score: 19    End of Session Equipment Utilized During Treatment: Gait belt Activity Tolerance: Patient tolerated treatment well Patient left: in bed;with call bell/phone within reach;with bed alarm set   PT Visit Diagnosis: Unsteadiness on feet (R26.81);Muscle weakness (generalized) (M62.81);Difficulty in walking, not elsewhere classified (R26.2);History of falling (Z91.81)     Time: 5093-2671 PT Time Calculation (min) (ACUTE ONLY): 36 min  Charges:  $Gait Training: 8-22 mins $Therapeutic Exercise: 8-22 mins                     Minna Merritts, PT, MPT    [name] 08/10/2021, 3:47 PM

## 2021-08-10 NOTE — Progress Notes (Signed)
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  Pt recently here for multiple months with difficult d/c planning, did ultimatley d/c to homeless shelter and was gone only a few hours before fall with L ankle and wrist fractures with return to the ED.   OT comments  Chart reviewed, RN cleared pt for participation in OT tx session. Pt greeted in bed, agreeable to tx session. Tx session targeted improving  strength and endurance to facilitate improved independence during completion of ADL tasks. Of note, pt appears with with increased standing tolerance during grooming tasks at sink level on this date.  Pt is left as received, NAD, all needs met. Pt will continue to benefit from skilled OT to address functional deficits. OT continues to recommend discharge to SNF, will continue to follow while admitted.    Recommendations for follow up therapy are one component of a multi-disciplinary discharge planning process, led by the attending physician.  Recommendations may be updated based on patient status, additional functional criteria and insurance authorization.    Follow Up Recommendations  Skilled nursing-short term rehab (<3 hours/day)    Assistance Recommended at Discharge Frequent or constant Supervision/Assistance  Equipment Recommendations   (defer to next venue of care)    Recommendations for Other Services      Precautions / Restrictions Precautions Precautions: Fall Precaution Comments: WBAT Restrictions Other Position/Activity Restrictions: splints removed 10/5, pt WBAT to L LE well, still prefers use of platform for L UE on RW       Mobility Bed  Mobility Overal bed mobility: Needs Assistance Bed Mobility: Supine to Sit;Sit to Supine     Supine to sit: Supervision;HOB elevated Sit to supine: Supervision;HOB elevated   General bed mobility comments: vcs required for technique    Transfers Overall transfer level: Needs assistance Equipment used: Rolling walker (2 wheels);Left platform walker Transfers: Sit to/from Stand;Stand Pivot Transfers Sit to Stand: Min assist;From elevated surface Stand pivot transfers: Min guard               Balance Overall balance assessment: Needs assistance Sitting-balance support: Feet supported;No upper extremity supported Sitting balance-Leahy Scale: Good     Standing balance support: Bilateral upper extremity supported;During functional activity Standing balance-Leahy Scale: Fair Standing balance comment: platform L side                           ADL either performed or assessed with clinical judgement   ADL                                       Functional mobility during ADLs: Min guard General ADL Comments: SUP bed level for donning socks, SET UP at edge of bed for UB dressing, CGA standing at sink level for grooming (washing face/hands/brushing teeth) for approx 5 minutes; vcs required throuhgout for sequencing and engagement in task; CGA for fxl mobilty with RW      Cognition Arousal/Alertness: Awake/alert Behavior During Therapy: WFL for tasks assessed/performed Overall Cognitive Status: Within Functional Limits for tasks assessed  General Comments: alert and oriented, follows 1 step directives however requires frequent vcs for attention to task          Exercises General Exercises - Upper Extremity Elbow Flexion: Strengthening;10 reps;Theraband;Both Theraband Level (Elbow Flexion): Level 1 (Yellow) Elbow Extension: Strengthening;10 reps;Limitations;Theraband;Both Theraband Level (Elbow  Extension): Level 1 (Yellow) Elbow Extension Limitations: adaptations for L wrist due to discomfort           Pertinent Vitals/ Pain       Pain Assessment: No/denies pain  Home Living                                           Progress Toward Goals  OT Goals(current goals can now be found in the care plan section)  Progress towards OT goals: Progressing toward goals  Acute Rehab OT Goals OT Goal Formulation: With patient  Plan Frequency remains appropriate;Discharge plan remains appropriate       AM-PAC OT "6 Clicks" Daily Activity     Outcome Measure     Help from another person taking care of personal grooming?: A Little Help from another person toileting, which includes using toliet, bedpan, or urinal?: A Little Help from another person bathing (including washing, rinsing, drying)?: A Little Help from another person to put on and taking off regular upper body clothing?: A Little Help from another person to put on and taking off regular lower body clothing?: A Little 6 Click Score: 15    End of Session Equipment Utilized During Treatment: Rolling walker (2 wheels)  OT Visit Diagnosis: Unsteadiness on feet (R26.81);Muscle weakness (generalized) (M62.81);History of falling (Z91.81);Adult, failure to thrive (R62.7)   Activity Tolerance Patient tolerated treatment well   Patient Left in bed;with call bell/phone within reach;with bed alarm set   Nurse Communication Mobility status        Time: 3220-2542 OT Time Calculation (min): 30 min  Charges: OT General Charges $OT Visit: 1 Visit OT Treatments $Self Care/Home Management : 8-22 mins $Therapeutic Exercise: 8-22 mins  Shanon Payor, OTD OTR/L  08/10/21, 11:54 AM

## 2021-08-10 NOTE — Progress Notes (Signed)
Oral Chemotherapy Pharmacist Encounter   Received a call from inpatient unit 1A charge RN Hildred Alamin that Mr, Molyneux was having trouble swallowing the Xtandi 80mg  tablets. We will switch patient to 40mg  tablets which are smaller and hopefully will be easier to swallow.   Home medication of Xtandi 40mg  tablets delivered to inpatient pharmacy this morning. Order for [facility] (enzalutamide) 4 tablets (160mg  total) by mouth daily entered yesterday and set to begin today.    Darl Pikes, PharmD, BCPS, BCOP, CPP Hematology/Oncology Clinical Pharmacist [facility]/AP Oral [facility] [phone]  08/10/2021 9:58 AM

## 2021-08-11 MED ORDER — TRAZODONE HCL 50 MG PO TABS
150.0000 mg | ORAL_TABLET | Freq: Every evening | ORAL | Status: DC | PRN
  Administered 2021-08-11 – 2021-08-29 (×2): 150 mg via ORAL
  Filled 2021-08-11 (×2): qty 1

## 2021-08-11 NOTE — Progress Notes (Signed)
PROGRESS NOTE    Ryan [name]  GDJ:[id number] DOB: April 30, 1961 DOA: 06/02/2021 PCP: Pcp, No  150A/150A-AA   Assessment & Plan:   Principal Problem:   Closed left ankle fracture Active Problems:   Fall   Anemia of chronic disease   Protein-calorie malnutrition, severe (HCC)   Cancer cachexia (HCC)   Prostate cancer metastatic to bone (HCC)   Left wrist fracture, closed, initial encounter   Hypocalcemia   Hyperkalemia   Closed fracture of left distal radius   Closed bimalleolar fracture of left ankle   60 year old man PMH metastatic prostate cancer presented with a fall resulting in closed left ankle fracture and left wrist fracture.  This was treated with conservative management.  Hospitalization uncomplicated.  Discharge delayed by difficulty with disposition.  Remains medically stable for discharge.  Accepting facility has some concern about the start date of his disability.   Patient received dose of Zometa on 07/30/2021.  Oncology repeated the PSA which seems rising at 1199 from 184. Patient wants to talk with his oncologist, talk with [name], he will try to see him soon. 10/25: Dr. Marc Morgans gone reordered a repeat PSA to confirm, we will see him after that resulted.     Closed left ankle and wrist fracture.  Secondary to fall.  Managed conservatively by orthopedic surgery.  Splint has been removed.  Able to work with PT and weightbearing.   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   Being followed up by oncology as an outpatient.  Patient received his Zometa today, next dose will be on November 18. Getting Lupron every 26-month, prior dose was approximately a month ago. Repeat PSA done yesterday by oncology with significant rise to 1199 from 184 a month ago. --started on Xtandi on 08/05/21. Plan: --cont p.o. Xtandi 160 mg daily indefinitely, per oncology --Continue monthly Zometa with his next dose due on approximately August 27, 2021.   Cancer  cachexia/protein caloric malnutrition, severe. -Encourage p.o. intake with supplements.   Anemia of chronic disease. -Stable  Insomnia --Pt often complains of not being able to sleep at night.  Pt is noted to be sleeping a lot during the day.  Nightly trazodone had stopped working.  Switched to seroquel nightly. --cont seroquel 100 mg nightly for now. --trazodone 150 mg nightly PRN   DVT prophylaxis: QQ:[id number]  Code Status: DNR  Family Communication:  Level of care: Med-Surg Dispo:   The patient is from: has been in the hospital for the past year Anticipated d/c is to: SNF Anticipated d/c date is: whenever bed available  Patient currently is medically ready to d/c.   Subjective and Interval History:  Found to be sleeping during the day again.   Objective: Vitals:   08/11/21 0606 08/11/21 0814 08/11/21 1154 08/11/21 1718  BP: (!) 108/54 111/60 (!) 121/52 116/60  Pulse: 82 90 89 80  Resp: 17 14 15 16   Temp: 97.6 F (36.4 C) 98.2 F (36.8 C) 98.2 F (36.8 C) 98.2 F (36.8 C)  TempSrc: Oral     SpO2: 97% 97% 99% 99%  Weight:      Height:        Intake/Output Summary (Last 24 hours) at 08/11/2021 1736 Last data filed at 08/11/2021 1720 Gross per 24 hour  Intake 690 ml  Output 2000 ml  Net -1310 ml   Filed Weights   07/27/21 0500 08/06/21 0500 08/07/21 0500  Weight: 63.3 kg 60.5 kg 60.1 kg    Examination:   Constitutional:  NAD CV: No cyanosis.   RESP: normal respiratory effort, on RA Extremities: No effusions, edema in BLE   Data Reviewed: I have personally reviewed following labs and imaging studies  CBC: Recent Labs  Lab 08/08/21 1018  WBC 6.0  HGB 7.8*  HCT 23.5*  MCV 77.0*  PLT 725   Basic Metabolic Panel: Recent Labs  Lab 08/08/21 1018  NA 130*  K 4.3  CL 103  CO2 21*  GLUCOSE 123*  BUN 9  CREATININE 0.69  CALCIUM 8.2*  MG 1.8    GFR: Estimated Creatinine Clearance: 84.5 mL/min (by C-G formula based on SCr of 0.69  mg/dL). Liver Function Tests: Recent Labs  Lab 08/08/21 1018  AST 32  ALT 7  ALKPHOS 1,000*  BILITOT 0.7  PROT 6.7  ALBUMIN 3.5   No results for input(s): LIPASE, AMYLASE in the last 168 hours. No results for input(s): AMMONIA in the last 168 hours. Coagulation Profile: No results for input(s): INR, PROTIME in the last 168 hours. Cardiac Enzymes: No results for input(s): CKTOTAL, CKMB, CKMBINDEX, TROPONINI in the last 168 hours. BNP (last 3 results) No results for input(s): PROBNP in the last 8760 hours. HbA1C: No results for input(s): HGBA1C in the last 72 hours. CBG: No results for input(s): GLUCAP in the last 168 hours. Lipid Profile: No results for input(s): CHOL, HDL, LDLCALC, TRIG, CHOLHDL, LDLDIRECT in the last 72 hours. Thyroid Function Tests: No results for input(s): TSH, T4TOTAL, FREET4, T3FREE, THYROIDAB in the last 72 hours. Anemia Panel: No results for input(s): VITAMINB12, FOLATE, FERRITIN, TIBC, IRON, RETICCTPCT in the last 72 hours. Sepsis Labs: No results for input(s): PROCALCITON, LATICACIDVEN in the last 168 hours.  No results found for this or any previous visit (from the past 240 hour(s)).    Radiology Studies: No results found.   Scheduled Meds:  calcium carbonate  1,250 mg Oral TID WC   enzalutamide  160 mg Oral Daily   feeding supplement  237 mL Oral TID BM   magnesium oxide  400 mg Oral Daily   midodrine  5 mg Oral TID WC   mirtazapine  15 mg Oral QHS   multivitamin with minerals  1 tablet Oral Daily   QUEtiapine  100 mg Oral QHS   rivaroxaban  10 mg Oral Q supper   senna-docusate  1 tablet Oral BID   Continuous Infusions:  sodium chloride Stopped (06/04/21 1216)     LOS: 69 days     Enzo Bi, MD [facility] If 7PM-7AM, please contact night-coverage 08/11/2021, 5:36 PM

## 2021-08-11 NOTE — Telephone Encounter (Signed)
Oral Chemotherapy Pharmacist Encounter   Delivered Xtandi 40mg  tablets to inpatient pharmacy for Mr. Rayl on Tuesday 08/10/21.    Darl Pikes, PharmD, BCPS, BCOP, CPP Hematology/Oncology Clinical Pharmacist [facility]/AP Oral [facility] [phone]  08/11/2021 9:09 AM

## 2021-08-11 NOTE — Progress Notes (Signed)
Nutrition Follow-up  DOCUMENTATION CODES:   Severe malnutrition in context of chronic illness, Underweight  INTERVENTION:   -Continue Ensure Enlive po TID, each supplement provides 350 kcal and 20 grams of protein  -Continue MVI with  minerals daily -Continue double protein portions with meals -Continue Magic cup TID with meals, each supplement provides 290 kcal and 9 grams of protein   NUTRITION DIAGNOSIS:   Severe Malnutrition related to chronic illness, cancer and cancer related treatments as evidenced by severe fat depletion, severe muscle depletion.  Ongoing  GOAL:   Patient will meet greater than or equal to 90% of their needs  Progressing   MONITOR:   PO intake, Supplement acceptance, Labs, Weight trends, I & O's  REASON FOR ASSESSMENT:   Malnutrition Screening Tool    ASSESSMENT:   60 yo male with a PMH of prostate cancer metastasized to bone, cancer cachexia, anemia, depression, and anxiety. Recently admitted 6/21-8/24. Discharged to homeless shelter then returned to ED a few hours later after a fall and pain to his wrist.  Reviewed I/O's: -1.1 L x 24 hours and -9.7 L since 07/28/21  UOP: 1.6 L x 24 hours  Per MD notes, pt remains medically stable for discharge.   Intake has improved since last visit. Noted meal completions continue to be variable (PO 10-100%). Pt consuming about 2 Ensure supplements per day.    Medications reviewed and include calcium carbonate, magnesium oxide, remeron, and senokot.   Labs reviewed: Na: 130.  Diet Order:   Diet Order             Diet regular Room service appropriate? Yes; Fluid consistency: Thin  Diet effective now                   EDUCATION NEEDS:   Education needs have been addressed  Skin:  Skin Assessment: Reviewed RN Assessment  Last BM:  08/10/21  Height:   Ht Readings from Last 1 Encounters:  07/06/21 6\' 5"  (1.956 m)    Weight:   Wt Readings from Last 1 Encounters:  08/07/21 60.1 kg     Ideal Body Weight:  94.5 kg  BMI:  Body mass index is 15.71 kg/m.  Estimated Nutritional Needs:   Kcal:  2400-2600  Protein:  130-150 grams  Fluid:  >2.4 L    Loistine Chance, RD, LDN, [place] Registered Dietitian II Certified Diabetes Care and Education Specialist Please refer to [facility] for RD and/or RD on-call/weekend/after hours pager

## 2021-08-12 LAB — PSA: Prostatic Specific Antigen: 1223 ng/mL — ABNORMAL HIGH (ref 0.00–4.00)

## 2021-08-12 NOTE — TOC Progression Note (Signed)
Transition of Care [facility]) - Progression Note    Patient Details  Name: Ryan [name] MRN: [id number] Date of Birth: 17-Dec-1960  [facility]) CM/SW [place], RN Phone Number: 08/12/2021, 11:03 AM  Clinical Narrative:   Resent out bed search for an ALF, Will continue to review         Expected Discharge Plan and Services                                                 Social Determinants of Health (SDOH) Interventions    Readmission Risk Interventions Readmission Risk Prevention Plan 04/23/2021  Transportation Screening Complete  PCP or Specialist Appt within 3-5 Days Complete  HRI or [place] Complete  Social Work Consult for [place] Planning/Counseling Not Complete  SW consult not completed comments RNCM assigned to patient  Palliative Care Screening Complete  Medication Review Press photographer) Complete  Some recent data might be hidden

## 2021-08-12 NOTE — Progress Notes (Signed)
PROGRESS NOTE    Ryan [name]  ZCH:[id number] DOB: 01/07/1961 DOA: 06/02/2021 PCP: Pcp, No   Assessment & Plan:   Principal Problem:   Closed left ankle fracture Active Problems:   Fall   Anemia of chronic disease   Protein-calorie malnutrition, severe ([place])   Cancer cachexia ([place])   Prostate cancer metastatic to bone ([place])   Left wrist fracture, closed, initial encounter   Hypocalcemia   Hyperkalemia   Closed fracture of left distal radius   Closed bimalleolar fracture of left ankle   Closed left ankle and wrist fracture: secondary to fall. Managed conservatively by orthopedic surgery. Splint has been removed.   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 Next dose of zometa on 11/18. Getting Lupron every 71-month, prior dose was approximately a month ago. Continue on xtandi as per onco    Cancer cachexia/protein caloric malnutrition: severe. Continue on nutritional supplements    Anemia of chronic disease: H&H are labile. Will transfuse if Hb < 7.0    Insomnia: continue on seroquel qhs    DVT prophylaxis: xarelto  Code Status: DNR Family Communication:  Disposition Plan: still awaiting SNF placement   Level of care: Med-Surg  Status is: Inpatient  Remains inpatient appropriate because: stable for d/c. Waiting on SNF placement still     Consultants:    Procedures:   Antimicrobials:   Subjective: Pt denies any complaints   Objective: Vitals:   08/11/21 2105 08/12/21 0013 08/12/21 0653 08/12/21 0802  BP: (!) 106/52 (!) 97/55  (!) 105/53  Pulse: 82 84  89  Resp: 18 18  16   Temp: 98 F (36.7 C) 97.9 F (36.6 C)  98 F (36.7 C)  TempSrc: Oral Oral  Oral  SpO2: 96% 96%  98%  Weight:   64.5 kg   Height:        Intake/Output Summary (Last 24 hours) at 08/12/2021 0814 Last data filed at 08/12/2021 0600 Gross per 24 hour  Intake 930 ml  Output 1200 ml  Net -270 ml   Filed Weights   08/06/21 0500 08/07/21 0500 08/12/21 0653  Weight: 60.5 kg 60.1  kg 64.5 kg    Examination:  General exam: Appears calm and comfortable. Frail appearing   Respiratory system: Clear to auscultation. Respiratory effort normal. Cardiovascular system: S1 & S2 +. No rubs, gallops or clicks. No pedal edema. Gastrointestinal system: Abdomen is nondistended, soft and nontender. Normal bowel sounds heard. Central nervous system: Alert and oriented. Moves all extremities  Psychiatry: Judgement and insight appear normal. Flat mood and affect    Data Reviewed: I have personally reviewed following labs and imaging studies  CBC: Recent Labs  Lab 08/08/21 1018  WBC 6.0  HGB 7.8*  HCT 23.5*  MCV 77.0*  PLT 287   Basic Metabolic Panel: Recent Labs  Lab 08/08/21 1018  NA 130*  K 4.3  CL 103  CO2 21*  GLUCOSE 123*  BUN 9  CREATININE 0.69  CALCIUM 8.2*  MG 1.8   GFR: Estimated Creatinine Clearance: 90.7 mL/min (by C-G formula based on SCr of 0.69 mg/dL). Liver Function Tests: Recent Labs  Lab 08/08/21 1018  AST 32  ALT 7  ALKPHOS 1,000*  BILITOT 0.7  PROT 6.7  ALBUMIN 3.5   No results for input(s): LIPASE, AMYLASE in the last 168 hours. No results for input(s): AMMONIA in the last 168 hours. Coagulation Profile: No results for input(s): INR, PROTIME in the last 168 hours. Cardiac Enzymes: No results for input(s):  CKTOTAL, CKMB, CKMBINDEX, TROPONINI in the last 168 hours. BNP (last 3 results) No results for input(s): PROBNP in the last 8760 hours. HbA1C: No results for input(s): HGBA1C in the last 72 hours. CBG: No results for input(s): GLUCAP in the last 168 hours. Lipid Profile: No results for input(s): CHOL, HDL, LDLCALC, TRIG, CHOLHDL, LDLDIRECT in the last 72 hours. Thyroid Function Tests: No results for input(s): TSH, T4TOTAL, FREET4, T3FREE, THYROIDAB in the last 72 hours. Anemia Panel: No results for input(s): VITAMINB12, FOLATE, FERRITIN, TIBC, IRON, RETICCTPCT in the last 72 hours. Sepsis Labs: No results for input(s):  PROCALCITON, LATICACIDVEN in the last 168 hours.  No results found for this or any previous visit (from the past 240 hour(s)).       Radiology Studies: No results found.      Scheduled Meds:  calcium carbonate  1,250 mg Oral TID WC   enzalutamide  160 mg Oral Daily   feeding supplement  237 mL Oral TID BM   magnesium oxide  400 mg Oral Daily   midodrine  5 mg Oral TID WC   mirtazapine  15 mg Oral QHS   multivitamin with minerals  1 tablet Oral Daily   QUEtiapine  100 mg Oral QHS   rivaroxaban  10 mg Oral Q supper   senna-docusate  1 tablet Oral BID   Continuous Infusions:  sodium chloride Stopped (06/04/21 1216)     LOS: 70 days    Time spent: 30 mins     Wyvonnia Dusky, MD [facility] Pager [phone]  If 7PM-7AM, please contact night-coverage 08/12/2021, 8:14 AM

## 2021-08-12 NOTE — TOC Progression Note (Addendum)
Transition of Care [facility]) - Progression Note    Patient Details  Name: Ryan [name] MRN: [id number] Date of Birth: 10-09-1961  Transition of Care [facility]) CM/SW [place], RN Phone Number: 08/12/2021, 12:04 PM  Clinical Narrative:   Received a call from Hydia from [facility] in [facility], she will come on Monday to do the face to face assessment and see if they are able to accept the patient,  [phone]        Expected Discharge Plan and Services                                                 Social Determinants of Health (SDOH) Interventions    Readmission Risk Interventions Readmission Risk Prevention Plan 04/23/2021  Transportation Screening Complete  PCP or Specialist Appt within 3-5 Days Complete  HRI or [place] Complete  Social Work Consult for [place] Planning/Counseling Not Complete  SW consult not completed comments RNCM assigned to patient  Palliative Care Screening Complete  Medication Review Press photographer) Complete  Some recent data might be hidden

## 2021-08-12 NOTE — TOC Progression Note (Addendum)
Transition of Care [facility]) - Progression Note    Patient Details  Name: Samier Jaco MRN: [id number] Date of Birth: [birth year]  Transition of Care [facility]) CM/SW [place], RN Phone Number: 08/12/2021, 1:55 PM  Clinical Narrative:   Hassan Rowan with [facility] called and reviewed the patient, She is speaking with the Administration at their facility to see if they can offer a bed, will let me know  [facility] called back and stated that they are unable to accept him without an income, however, on ce he gets SSI they will be happy to assess face to face to possibly offer a bed,  her number is [phone]       Expected Discharge Plan and Services                                                 Social Determinants of Health (SDOH) Interventions    Readmission Risk Interventions Readmission Risk Prevention Plan 04/23/2021  Transportation Screening Complete  PCP or Specialist Appt within 3-5 Days Complete  HRI or Home Care Consult Complete  Social Work Consult for [place] Planning/Counseling Not Complete  SW consult not completed comments RNCM assigned to patient  Palliative Care Screening Complete  Medication Review Press photographer) Complete  Some recent data might be hidden

## 2021-08-12 NOTE — NC FL2 (Signed)
[place] LEVEL OF CARE SCREENING TOOL     IDENTIFICATION  Patient Name: Ryan [name] Birthdate: [birth year] Sex: male Admission Date (Current Location): 06/02/2021  [facility] and [place] Number:  Engineering geologist and Address:  [facility], [address] [place], [place] [address]      Provider Number: [id number]  Attending Physician Name and Address:  Wyvonnia Dusky, MD  Relative Name and Phone Number:  Fredrich Romans [phone]    Current Level of Care: Hospital Recommended Level of Care: [place] Prior Approval Number:    Date Approved/Denied:   PASRR Number: [id number]  Discharge Plan: Other (Comment) (Assisted Living)    Current Diagnoses: Patient Active Problem List   Diagnosis Date Noted   Closed bimalleolar fracture of left ankle    Closed fracture of left distal radius    Closed left ankle fracture 06/03/2021   Left wrist fracture, closed, initial encounter 06/03/2021   Hypocalcemia 06/03/2021   Hyperkalemia 06/03/2021   Hypotension 04/07/2021   Prostate cancer metastatic to bone ([place]) 04/06/2021   Anemia of chronic disease 03/30/2021   Thrombocytopenia ([place]) 03/30/2021   Thoracic compression fracture, closed, initial encounter ([place]) 03/30/2021   Pathologic fracture of thoracic vertebrae, initial encounter 03/30/2021   Malnourished ([place]) 03/30/2021   Protein-calorie malnutrition, severe ([place]) 03/30/2021   Cancer cachexia ([place]) 03/30/2021   Infestation by bed bug 03/30/2021   Fall    Transaminitis    Pain    Closed right hip fracture ([place]) 04/01/2020   Symptomatic anemia 04/01/2020   Hyponatremia 04/01/2020   Closed rib fracture 04/01/2020   Leukocytosis 04/01/2020   Rhabdomyolysis 04/01/2020   Dehydration 04/01/2020    Orientation RESPIRATION BLADDER Height & Weight     Self, Time, Situation, Place  Normal Continent Weight: 64.5 kg Height:  6\' 5"  (195.6 cm)  BEHAVIORAL  SYMPTOMS/MOOD NEUROLOGICAL BOWEL NUTRITION STATUS      Continent Diet (regular)  AMBULATORY STATUS COMMUNICATION OF NEEDS Skin   Limited Assist Verbally Normal                       Personal Care Assistance Level of Assistance  Bathing, Dressing Bathing Assistance: Limited assistance   Dressing Assistance: Limited assistance     Functional Limitations Info             SPECIAL CARE FACTORS FREQUENCY  PT (By licensed PT), OT (By licensed OT)     PT Frequency: 3 times per week OT Frequency: 3 times per week            Contractures Contractures Info: Not present    Additional Factors Info  Code Status, Allergies Code Status Info: DNR Allergies Info: NKDA           Current Medications (08/12/2021):  This is the current hospital active medication list Current Facility-Administered Medications  Medication Dose Route Frequency Provider Last Rate Last Admin   0.9 %  sodium chloride infusion   Intravenous PRN Lorella Nimrod, MD   Stopped at 06/04/21 1216   acetaminophen (TYLENOL) 160 MG/5ML solution 650 mg  650 mg Oral Q6H PRN Ivor Costa, MD       calcium carbonate (OS-CAL - dosed in mg of elemental calcium) tablet 1,250 mg  1,250 mg Oral TID WC Ivor Costa, MD   1,250 mg at 08/12/21 1023   enzalutamide (XTANDI) tablet 160 mg  160 mg Oral Daily Lloyd Huger, MD   160 mg at 08/11/21  1307   feeding supplement (ENSURE ENLIVE / ENSURE PLUS) liquid 237 mL  237 mL Oral TID BM Samuella Cota, MD   237 mL at 08/12/21 1023   hydrOXYzine (ATARAX/VISTARIL) tablet 25 mg  25 mg Oral TID PRN Ivor Costa, MD   25 mg at 07/22/21 2226   magnesium oxide (MAG-OX) tablet 400 mg  400 mg Oral Daily Enzo Bi, MD   400 mg at 08/12/21 1022   midodrine (PROAMATINE) tablet 5 mg  5 mg Oral TID WC Kathie Dike, MD   5 mg at 08/12/21 1022   mirtazapine (REMERON) tablet 15 mg  15 mg Oral QHS Ivor Costa, MD   15 mg at 08/11/21 2255   multivitamin with minerals tablet 1 tablet  1 tablet  Oral Daily Lorella Nimrod, MD   1 tablet at 08/12/21 1022   ondansetron (ZOFRAN) injection 4 mg  4 mg Intravenous Q8H PRN Ivor Costa, MD   4 mg at 06/25/21 0556   ondansetron (ZOFRAN-ODT) disintegrating tablet 4 mg  4 mg Oral Q8H PRN Enzo Bi, MD   4 mg at 07/31/21 1941   QUEtiapine (SEROQUEL) tablet 100 mg  100 mg Oral QHS Enzo Bi, MD   100 mg at 08/11/21 2256   rivaroxaban (XARELTO) tablet 10 mg  10 mg Oral Q supper Rauer, Forde Dandy, RPH   10 mg at 08/11/21 1638   senna-docusate (Senokot-S) tablet 1 tablet  1 tablet Oral BID Ralene Muskrat B, MD   1 tablet at 08/06/21 2128   traMADol (ULTRAM) tablet 50 mg  50 mg Oral Q6H PRN Enzo Bi, MD   50 mg at 08/11/21 1951   traZODone (DESYREL) tablet 150 mg  150 mg Oral QHS PRN Enzo Bi, MD   150 mg at 08/11/21 0244     Discharge Medications: Please see discharge summary for a list of discharge medications.  Relevant Imaging Results:  Relevant Lab Results:   Additional Information SS# [id number]  Conception Oms, RN

## 2021-08-12 NOTE — Progress Notes (Signed)
[place]  [phone] [phone]  Patient Care Team: Pcp, No as PCP - General   Name of the patient: Ryan [name]  [id number]  1961-08-14   HPI: Patient is a 60 y.o. male with progressive metastatic prostate cancer, now castration resistant.  Currently treated with [name] (enzalutamide) and ADT. Enzalutamide started on 08/05/21.   Reason for Consult: Oral chemotherapy follow-up for enzalutamide therapy.   PAST MEDICAL HISTORY: Past Medical History:  Diagnosis Date   Adenomatous colon polyp    Anemia    Depression    Elevated LFTs     HEMATOLOGY/ONCOLOGY HISTORY:  Oncology History   No history exists.    ALLERGIES:  has No Known Allergies.  MEDICATIONS:  Current Facility-Administered Medications  Medication Dose Route Frequency Provider Last Rate Last Admin   0.9 %  sodium chloride infusion   Intravenous PRN Lorella Nimrod, MD   Stopped at 06/04/21 1216   acetaminophen (TYLENOL) 160 MG/5ML solution 650 mg  650 mg Oral Q6H PRN Ivor Costa, MD       calcium carbonate (OS-CAL - dosed in mg of elemental calcium) tablet 1,250 mg  1,250 mg Oral TID WC Ivor Costa, MD   1,250 mg at 08/12/21 1023   enzalutamide (XTANDI) tablet 160 mg  160 mg Oral Daily Lloyd Huger, MD   160 mg at 08/12/21 1343   feeding supplement (ENSURE ENLIVE / ENSURE PLUS) liquid 237 mL  237 mL Oral TID BM Samuella Cota, MD   237 mL at 08/12/21 1023   hydrOXYzine (ATARAX/VISTARIL) tablet 25 mg  25 mg Oral TID PRN Ivor Costa, MD   25 mg at 07/22/21 2226   magnesium oxide (MAG-OX) tablet 400 mg  400 mg Oral Daily Enzo Bi, MD   400 mg at 08/12/21 1022   midodrine (PROAMATINE) tablet 5 mg  5 mg Oral TID WC Kathie Dike, MD   5 mg at 08/12/21 1343   mirtazapine (REMERON) tablet 15 mg  15 mg Oral QHS Ivor Costa, MD   15 mg at 08/11/21 2255   multivitamin with minerals tablet 1 tablet  1 tablet Oral Daily Lorella Nimrod, MD   1 tablet  at 08/12/21 1022   ondansetron (ZOFRAN) injection 4 mg  4 mg Intravenous Q8H PRN Ivor Costa, MD   4 mg at 06/25/21 0556   ondansetron (ZOFRAN-ODT) disintegrating tablet 4 mg  4 mg Oral Q8H PRN Enzo Bi, MD   4 mg at 07/31/21 0929   QUEtiapine (SEROQUEL) tablet 100 mg  100 mg Oral QHS Enzo Bi, MD   100 mg at 08/11/21 2256   rivaroxaban (XARELTO) tablet 10 mg  10 mg Oral Q supper Rauer, Forde Dandy, RPH   10 mg at 08/11/21 1638   senna-docusate (Senokot-S) tablet 1 tablet  1 tablet Oral BID Ralene Muskrat B, MD   1 tablet at 08/06/21 2128   traMADol (ULTRAM) tablet 50 mg  50 mg Oral Q6H PRN Enzo Bi, MD   50 mg at 08/11/21 1951   traZODone (DESYREL) tablet 150 mg  150 mg Oral QHS PRN Enzo Bi, MD   150 mg at 08/11/21 0244    VITAL SIGNS: BP (!) 105/53 (BP Location: Left Leg)   Pulse 89   Temp 98 F (36.7 C) (Oral)   Resp 16   Ht 6\' 5"  (1.956 m)   Wt 64.5 kg   SpO2 98%   BMI 16.86 kg/m  [employer]  08/06/21 0500 08/07/21 0500 08/12/21 0653  Weight: 60.5 kg 60.1 kg 64.5 kg    Estimated body mass index is 16.86 kg/m as calculated from the following:   Height as of this encounter: 6\' 5"  (1.956 m).   Weight as of this encounter: 64.5 kg.  LABS: CBC:    Component Value Date/Time   WBC 6.0 08/08/2021 1018   HGB 7.8 (L) 08/08/2021 1018   HCT 23.5 (L) 08/08/2021 1018   PLT 282 08/08/2021 1018   MCV 77.0 (L) 08/08/2021 1018   NEUTROABS 4.1 06/18/2021 1802   LYMPHSABS 1.3 06/18/2021 1802   MONOABS 1.0 06/18/2021 1802   EOSABS 0.1 06/18/2021 1802   BASOSABS 0.0 06/18/2021 1802   Comprehensive Metabolic Panel:    Component Value Date/Time   NA 130 (L) 08/08/2021 1018   K 4.3 08/08/2021 1018   CL 103 08/08/2021 1018   CO2 21 (L) 08/08/2021 1018   BUN 9 08/08/2021 1018   CREATININE 0.69 08/08/2021 1018   GLUCOSE 123 (H) 08/08/2021 1018   CALCIUM 8.2 (L) 08/08/2021 1018   AST 32 08/08/2021 1018   ALT 7 08/08/2021 1018   ALKPHOS 1,000 (H) 08/08/2021 1018   BILITOT  0.7 08/08/2021 1018   PROT 6.7 08/08/2021 1018   ALBUMIN 3.5 08/08/2021 1018     Assessment and Plan: Continue enzalutamide 160mg  daily   Oral Chemotherapy Side Effect/Intolerance:  No reported fatigue, edema, or bowel movement changes Reviewed BP checks since initiation of enzalutamide, no hypertension seen. Patient's BPs tend to run on the low-normal side.  Other: He does report trouble sleeping, they are adjusting his night time medications to help with this  Oral Chemotherapy Adherence: one day of enzalutamide was missed when [name] has transitioned from 80mg  tablets to 40mg  tablets due to swallowing issues. He reported being able to swallow the 40mg  tablets with no issue.  No patient barriers to medication adherence identified.   New medications:  Quetiapine: order recently added, enzalutamide may decrease the serum concentration of quetiapine. No baseline does adjustment needed, monitor patient for decreased effectiveness of quetiapine  Medication Access Issues: no issues  Patient expressed understanding and was in agreement with this plan. He also understands that He can call clinic at any time with any questions, concerns, or complaints.   Thank you for allowing me to participate in the care of this very pleasant patient.   Time Total: 20 mins  Visit consisted of counseling and education on dealing with issues of symptom management in the setting of serious and potentially life-threatening illness.Greater than 50%  of this time was spent counseling and coordinating care related to the above assessment and plan.  Signed by: Darl Pikes, PharmD, BCPS, Salley Slaughter, CPP Hematology/Oncology Clinical Pharmacist Practitioner [facility] [phone]  08/12/2021 2:43 PM

## 2021-08-12 NOTE — Evaluation (Addendum)
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  Pt recently here for multiple months with difficult d/c planning, did ultimatley d/c to homeless shelter and was gone only a few hours before fall with L ankle and wrist fractures with return to the ED. Patient has been upgraded to [facility]    Clinical Impression  PT re-evaluation completed as patient with prolonged hospital stay. Patient is making progress towards functional independence. PT goals have been updated accordingly. He continues to require assistance with standing and is unable to stand without minimal assistance and bed elevated despite cues for technique. Patient has progressed to walking the lap around nursing station with L platform walker and Min guard assistance. He is fatigued with activity and continues to have generalized weakness and deconditioning. As he continues to make progress with functional mobility, recommend to continue PT to maximize independence and facilitate return to prior level of function.      Recommendations for follow up therapy are one component of a multi-disciplinary discharge planning process, led by the attending physician.  Recommendations may be updated based on patient status, additional functional criteria and insurance authorization.  Follow Up Recommendations Home health PT    Assistance Recommended at Discharge Intermittent Supervision/Assistance  Functional Status Assessment Patient has had a recent decline in their functional status and demonstrates the ability to make significant improvements in function in a reasonable and predictable amount of time.  Equipment  Recommendations  None recommended by PT    Recommendations for Other Services       Precautions / Restrictions Precautions Precautions: Fall Restrictions Weight Bearing Restrictions: Yes LUE Weight Bearing: Weight bearing as tolerated LLE Weight Bearing: Weight bearing as tolerated      Mobility  Bed Mobility Overal bed mobility: Needs Assistance Bed Mobility: Supine to Sit     Supine to sit: Supervision;HOB elevated     General bed mobility comments: increased time reuqired to complete tasks. no physical assistance required    Transfers Overall transfer level: Needs assistance Equipment used: Rolling walker (2 wheels);Left platform walker Transfers: Sit to/from Stand Sit to Stand: Min assist;From elevated surface           General transfer comment: multiple standing bouts performed from bed. patient was unable to stand without assistance. minimal assistance is required for lifting    Ambulation/Gait Ambulation/Gait assistance: Min guard Gait Distance (Feet): 175 Feet Assistive device: Rolling walker (2 wheels);Left platform walker Gait Pattern/deviations: Trunk flexed;Decreased stride length Gait velocity: decreased   General Gait Details: verbal cues for technique and safety using rolling walker. patient fatigued after walking with mild shortness of breath. Sp02 99% on room air and heart rate 107bpm  Stairs            Wheelchair Mobility    Modified Rankin (Stroke Patients Only)       Balance   Sitting-balance support: Feet supported;No upper extremity supported Sitting balance-Leahy Scale: Good     Standing balance support: Bilateral upper extremity supported;During functional activity Standing balance-Leahy Scale: Fair Standing balance comment: platform L side  Pertinent Vitals/Pain Pain Assessment: Faces Faces Pain Scale: Hurts a little bit Pain Location: left wrist, bilateral ankles, bilateral  knees Pain Descriptors / Indicators: Sore    Home Living Family/patient expects to be discharged to:: Skilled nursing facility Living Arrangements: Alone                      Prior Function                       Hand Dominance        Extremity/Trunk Assessment   Upper Extremity Assessment Upper Extremity Assessment: Generalized weakness LUE Deficits / Details: AROM limited wrist extension and finger flexion    Lower Extremity Assessment Lower Extremity Assessment: Generalized weakness (dorsiflexion and plantarflexion 5/5, endurance impaired for sustained activity)       Communication      Cognition Arousal/Alertness: Awake/alert Behavior During Therapy: WFL for tasks assessed/performed Overall Cognitive Status: Within Functional Limits for tasks assessed                                          General Comments      Exercises     Assessment/Plan    PT Assessment Patient needs continued PT services  PT Problem List Decreased strength;Decreased range of motion;Decreased activity tolerance;Decreased balance;Decreased mobility;Pain;Decreased safety awareness;Decreased knowledge of use of DME       PT Treatment Interventions DME instruction;Gait training;Stair training;Therapeutic activities;Functional mobility training;Therapeutic exercise;Balance training;Neuromuscular re-education;Patient/family education    PT Goals (Current goals can be found in the Care Plan section)  Acute Rehab PT Goals Patient Stated Goal: to get stronger PT Goal Formulation: With patient Time For Goal Achievement: 08/26/21 Potential to Achieve Goals: Fair    Frequency Min 2X/week   Barriers to discharge Decreased caregiver support      Co-evaluation               AM-PAC PT "6 Clicks" Mobility  Outcome Measure Help needed turning from your back to your side while in a flat bed without using bedrails?: None Help needed moving from lying on  your back to sitting on the side of a flat bed without using bedrails?: None Help needed moving to and from a bed to a chair (including a wheelchair)?: A Little Help needed standing up from a chair using your arms (e.g., wheelchair or bedside chair)?: A Little Help needed to walk in hospital room?: A Little Help needed climbing 3-5 steps with a railing? : A Lot 6 Click Score: 19    End of Session Equipment Utilized During Treatment: Gait belt Activity Tolerance: Patient tolerated treatment well Patient left: in bed;with call bell/phone within reach;with bed alarm set (sitting up on edge of bed)   PT Visit Diagnosis: Unsteadiness on feet (R26.81);Muscle weakness (generalized) (M62.81);Difficulty in walking, not elsewhere classified (R26.2);History of falling (Z91.81)    Time: 4098-1191 PT Time Calculation (min) (ACUTE ONLY): 50 min   Charges:   PT Evaluation $PT Re-evaluation: 1 Re-eval PT Treatments $Gait Training: 8-22 mins $Therapeutic Activity: 8-22 mins       Minna Merritts, PT, MPT   [name] 08/12/2021, 12:56 PM

## 2021-08-13 NOTE — Progress Notes (Signed)
PROGRESS NOTE    Ryan [name]  FAO:[id number] DOB: April 09, 1961 DOA: 06/02/2021 PCP: Pcp, No   Assessment & Plan:   Principal Problem:   Closed left ankle fracture Active Problems:   Fall   Anemia of chronic disease   Protein-calorie malnutrition, severe ([place])   Cancer cachexia ([place])   Prostate cancer metastatic to bone ([place])   Left wrist fracture, closed, initial encounter   Hypocalcemia   Hyperkalemia   Closed fracture of left distal radius   Closed bimalleolar fracture of left ankle   Closed left ankle and wrist fracture: secondary to fall. Managed conservatively by orthopedic surgery. Splint has been removed.   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 Next dose of zometa on 11/18. Getting Lupron every 67-month, prior dose was approximately a month ago. Continue on xtandi as per onco    Cancer cachexia/protein caloric malnutrition: severe. Continue on nutritional supplements    Anemia of chronic disease: H&H are labile. Will transfuse if Hb < 7.0   Insomnia: continue on seroquel qhs    DVT prophylaxis: xarelto  Code Status: DNR Family Communication:  Disposition Plan: still awaiting SNF placement   Level of care: Med-Surg  Status is: Inpatient  Remains inpatient appropriate because: stable for d/c. Still waiting on SNF placement     Consultants:    Procedures:   Antimicrobials:   Subjective: Pt c/o wrist pain   Objective: Vitals:   08/12/21 0802 08/12/21 2010 08/12/21 2336 08/13/21 0601  BP: (!) 105/53 110/60 (!) 116/53 (!) 104/49  Pulse: 89 79 96 89  Resp: 16 16 16 16   Temp: 98 F (36.7 C) 98 F (36.7 C) 98.2 F (36.8 C) 98 F (36.7 C)  TempSrc: Oral     SpO2: 98% 98% 100% 99%  Weight:      Height:        Intake/Output Summary (Last 24 hours) at 08/13/2021 0750 Last data filed at 08/13/2021 0228 Gross per 24 hour  Intake --  Output 650 ml  Net -650 ml   Filed Weights   08/06/21 0500 08/07/21 0500 08/12/21 0653  Weight: 60.5 kg 60.1  kg 64.5 kg    Examination:  General exam: Appears comfortable. Frail appearing Respiratory system: clear breath sounds b/l. Cardiovascular system: S1/S2+. No rubs or clicks  Gastrointestinal system: abd is soft, NT,ND & hypoactive bowel sounds Central nervous system: Alert and oriented. Moves all extremities  Psychiatry: Judgement and insight appear normal. Flat mood and affect     Data Reviewed: I have personally reviewed following labs and imaging studies  CBC: Recent Labs  Lab 08/08/21 1018  WBC 6.0  HGB 7.8*  HCT 23.5*  MCV 77.0*  PLT 696   Basic Metabolic Panel: Recent Labs  Lab 08/08/21 1018  NA 130*  K 4.3  CL 103  CO2 21*  GLUCOSE 123*  BUN 9  CREATININE 0.69  CALCIUM 8.2*  MG 1.8   GFR: Estimated Creatinine Clearance: 90.7 mL/min (by C-G formula based on SCr of 0.69 mg/dL). Liver Function Tests: Recent Labs  Lab 08/08/21 1018  AST 32  ALT 7  ALKPHOS 1,000*  BILITOT 0.7  PROT 6.7  ALBUMIN 3.5   No results for input(s): LIPASE, AMYLASE in the last 168 hours. No results for input(s): AMMONIA in the last 168 hours. Coagulation Profile: No results for input(s): INR, PROTIME in the last 168 hours. Cardiac Enzymes: No results for input(s): CKTOTAL, CKMB, CKMBINDEX, TROPONINI in the last 168 hours. BNP (last 3 results)  No results for input(s): PROBNP in the last 8760 hours. HbA1C: No results for input(s): HGBA1C in the last 72 hours. CBG: No results for input(s): GLUCAP in the last 168 hours. Lipid Profile: No results for input(s): CHOL, HDL, LDLCALC, TRIG, CHOLHDL, LDLDIRECT in the last 72 hours. Thyroid Function Tests: No results for input(s): TSH, T4TOTAL, FREET4, T3FREE, THYROIDAB in the last 72 hours. Anemia Panel: No results for input(s): VITAMINB12, FOLATE, FERRITIN, TIBC, IRON, RETICCTPCT in the last 72 hours. Sepsis Labs: No results for input(s): PROCALCITON, LATICACIDVEN in the last 168 hours.  No results found for this or any  previous visit (from the past 240 hour(s)).       Radiology Studies: No results found.      Scheduled Meds:  calcium carbonate  1,250 mg Oral TID WC   enzalutamide  160 mg Oral Daily   feeding supplement  237 mL Oral TID BM   magnesium oxide  400 mg Oral Daily   midodrine  5 mg Oral TID WC   mirtazapine  15 mg Oral QHS   multivitamin with minerals  1 tablet Oral Daily   QUEtiapine  100 mg Oral QHS   rivaroxaban  10 mg Oral Q supper   senna-docusate  1 tablet Oral BID   Continuous Infusions:  sodium chloride Stopped (06/04/21 1216)     LOS: 71 days    Time spent: 15 mins     Wyvonnia Dusky, MD [facility] Pager [phone]  If 7PM-7AM, please contact night-coverage 08/13/2021, 7:50 AM

## 2021-08-13 NOTE — Progress Notes (Signed)
Ryan [name]  [phone] [phone]  ID: Ryan [name] OB: 1960-12-15  MR#: [id number]  YJE#:[id number]  Patient Care Team: Pcp, No as PCP - General  CHIEF COMPLAINT: Progressive stage IV cancer with widespread bony metastasis.  INTERVAL HISTORY: Patient reports he is getting stronger on a daily basis.  He continues to have good appetite.  He does not complain of pain today.  He is tolerating Xtandi well without significant side effects.  REVIEW OF SYSTEMS:   Review of Systems  Constitutional: Negative.  Negative for fever, malaise/fatigue and weight loss.  Respiratory: Negative.  Negative for cough, hemoptysis and shortness of breath.   Cardiovascular: Negative.  Negative for chest pain and leg swelling.  Gastrointestinal: Negative.  Negative for abdominal pain.  Genitourinary: Negative.  Negative for dysuria.  Musculoskeletal: Negative.  Negative for back pain.  Skin: Negative.  Negative for rash.  Neurological:  Positive for weakness. Negative for dizziness, focal weakness and headaches.  Psychiatric/Behavioral: Negative.  The patient is not nervous/anxious.    As per HPI. Otherwise, a complete review of systems is negative.  PAST MEDICAL HISTORY: Past Medical History:  Diagnosis Date   Adenomatous colon polyp    Anemia    Depression    Elevated LFTs     PAST SURGICAL HISTORY: Past Surgical History:  Procedure Laterality Date   FRACTURE SURGERY     INTRAMEDULLARY (IM) NAIL INTERTROCHANTERIC Right 04/02/2020   Procedure: INTRAMEDULLARY (IM) NAIL INTERTROCHANTRIC;  Surgeon: Corky Mull, MD;  Location: [facility] ORS;  Service: Orthopedics;  Laterality: Right;    FAMILY HISTORY: Family History  Problem Relation Age of Onset   Heart disease Other    Colon polyps Cousin        maternal   Colon polyps Cousin    Colon polyps Maternal Aunt    Colon cancer Maternal Grandfather     ADVANCED DIRECTIVES (Y/N):  @ADVDIR @  HEALTH  MAINTENANCE: Social History   Tobacco Use   Smoking status: Some Days   Smokeless tobacco: Never  Substance Use Topics   Alcohol use: Yes    Comment: 4 beers weekly   Drug use: No     Colonoscopy:  PAP:  Bone density:  Lipid panel:  No Known Allergies  Current Facility-Administered Medications  Medication Dose Route Frequency Provider Last Rate Last Admin   0.9 %  sodium chloride infusion   Intravenous PRN Lorella Nimrod, MD   Stopped at 06/04/21 1216   acetaminophen (TYLENOL) 160 MG/5ML solution 650 mg  650 mg Oral Q6H PRN Ivor Costa, MD       calcium carbonate (OS-CAL - dosed in mg of elemental calcium) tablet 1,250 mg  1,250 mg Oral TID WC Ivor Costa, MD   1,250 mg at 08/13/21 1639   enzalutamide (XTANDI) tablet 160 mg  160 mg Oral Daily Lloyd Huger, MD   160 mg at 08/13/21 1636   feeding supplement (ENSURE ENLIVE / ENSURE PLUS) liquid 237 mL  237 mL Oral TID BM Samuella Cota, MD   237 mL at 08/13/21 1351   hydrOXYzine (ATARAX/VISTARIL) tablet 25 mg  25 mg Oral TID PRN Ivor Costa, MD   25 mg at 07/22/21 2226   magnesium oxide (MAG-OX) tablet 400 mg  400 mg Oral Daily Enzo Bi, MD   400 mg at 08/13/21 0911   midodrine (PROAMATINE) tablet 5 mg  5 mg Oral TID WC Kathie Dike, MD   5 mg at 08/13/21 1638   mirtazapine (REMERON)  tablet 15 mg  15 mg Oral QHS Ivor Costa, MD   15 mg at 08/12/21 2122   multivitamin with minerals tablet 1 tablet  1 tablet Oral Daily Lorella Nimrod, MD   1 tablet at 08/13/21 0911   ondansetron (ZOFRAN) injection 4 mg  4 mg Intravenous Q8H PRN Ivor Costa, MD   4 mg at 06/25/21 0556   ondansetron (ZOFRAN-ODT) disintegrating tablet 4 mg  4 mg Oral Q8H PRN Enzo Bi, MD   4 mg at 07/31/21 4431   QUEtiapine (SEROQUEL) tablet 100 mg  100 mg Oral QHS Enzo Bi, MD   100 mg at 08/12/21 2122   rivaroxaban (XARELTO) tablet 10 mg  10 mg Oral Q supper Rauer, Forde Dandy, RPH   10 mg at 08/13/21 1639   senna-docusate (Senokot-S) tablet 1 tablet  1 tablet  Oral BID Ralene Muskrat B, MD   1 tablet at 08/06/21 2128   traMADol (ULTRAM) tablet 50 mg  50 mg Oral Q6H PRN Enzo Bi, MD   50 mg at 08/11/21 1951   traZODone (DESYREL) tablet 150 mg  150 mg Oral QHS PRN Enzo Bi, MD   150 mg at 08/11/21 0244    OBJECTIVE: Vitals:   08/13/21 1229 08/13/21 1542  BP: 124/65 121/64  Pulse: 87 82  Resp: 16 14  Temp: 97.9 F (36.6 C) 98.1 F (36.7 C)  SpO2: 100% 98%     Body mass index is 16.86 kg/m.    ECOG FS:1 - Symptomatic but completely ambulatory  General: Well-developed, well-nourished, no acute distress. Eyes: Pink conjunctiva, anicteric sclera. HEENT: Normocephalic, moist mucous membranes. Lungs: No audible wheezing or coughing. Heart: Regular rate and rhythm. Abdomen: Soft, nontender, no obvious distention. Musculoskeletal: No edema, cyanosis, or clubbing. Neuro: Alert, answering all questions appropriately. Cranial nerves grossly intact. Skin: No rashes or petechiae noted. Psych: Normal affect.   LAB RESULTS:  Lab Results  Component Value Date   NA 130 (L) 08/08/2021   K 4.3 08/08/2021   CL 103 08/08/2021   CO2 21 (L) 08/08/2021   GLUCOSE 123 (H) 08/08/2021   BUN 9 08/08/2021   CREATININE 0.69 08/08/2021   CALCIUM 8.2 (L) 08/08/2021   PROT 6.7 08/08/2021   ALBUMIN 3.5 08/08/2021   AST 32 08/08/2021   ALT 7 08/08/2021   ALKPHOS 1,000 (H) 08/08/2021   BILITOT 0.7 08/08/2021   GFRNONAA >60 08/08/2021   GFRAA >60 04/06/2020    Lab Results  Component Value Date   WBC 6.0 08/08/2021   NEUTROABS 4.1 06/18/2021   HGB 7.8 (L) 08/08/2021   HCT 23.5 (L) 08/08/2021   MCV 77.0 (L) 08/08/2021   PLT 282 08/08/2021     STUDIES: No results found.  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  Initially patient's PSA decreased from greater than 3000 down to 184, but now appears to be castrate resistant increasing back up  to 1217.  Since initiating Xtandi less than 1 week ago, patient's PSA has stabilized and now is 1223.  He last received Lupron 22.5 mg on July 02, 2021.  He last received Zometa on July 30, 2021.  Continue p.o. Xtandi 160 mg daily indefinitely or until progression of disease.  Continue monthly Zometa with his next dose due on approximately August 27, 2021.  Appreciate clinical pharmacy input. Anemia: Chronic and unchanged. Patient's most recent hemoglobin is 7.8. Bony disease: Patient last received Zometa on  July 30, 2021.  He has a mild hypocalcemia at 8.2. Disposition: Patient now has Medicaid and possibly could be discharged to rehab if accepted.  Will arrange follow-up in the cancer center upon discharge.  Will follow.  Lloyd Huger, MD   08/13/2021 7:59 PM

## 2021-08-13 NOTE — Progress Notes (Signed)
Patient refused ambulation and stated that he has already walked twice today.

## 2021-08-13 NOTE — Progress Notes (Signed)
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  Pt recently here for multiple months with difficult d/c planning, did ultimatley d/c to homeless shelter and was gone only a few hours before fall with L ankle and wrist fractures with return to the ED.   OT comments  Chart reviewed, RN cleared pt for participation in OT tx session. Pt greeted in bed, alert and oriented x4, agreeable to tx session. Tx session targeted progressing strength/endurance for increased safe participation in ADL/IADL, therex to improve BUE strength, preserve wrist P/AROM. Pt continued to require MIN A for STS from an elevated surface, tolerated increased time in standing on this date for dynamic standing ADL completion. Pt is left as received, NAD all needs met. Discharge recommendation has been changed to [facility] due to pt progress. OT will continue to follow.    Recommendations for follow up therapy are one component of a multi-disciplinary discharge planning process, led by the attending physician.  Recommendations may be updated based on patient status, additional functional criteria and insurance authorization.    Follow Up Recommendations  HHOT   Assistance Recommended at Discharge Frequent or constant Supervision/Assistance  Equipment Recommendations  BSC;Tub/shower seat    Recommendations for Other Services      Precautions / Restrictions Precautions Precautions: Fall       Mobility Bed Mobility Overal bed mobility: Modified Independent                  Transfers Overall transfer level: Needs assistance Equipment used: Rolling walker (2 wheels);Left platform  walker Transfers: Sit to/from Stand;Stand Pivot Transfers Sit to Stand: Min assist;From elevated surface Stand pivot transfers: Min guard               Balance Overall balance assessment: Needs assistance Sitting-balance support: Feet supported;No upper extremity supported Sitting balance-Leahy Scale: Good Sitting balance - Comments: good sitting balance with dynamic tasks at edge of bed   Standing balance support: Bilateral upper extremity supported;During functional activity Standing balance-Leahy Scale: Fair                             ADL either performed or assessed with clinical judgement   ADL Overall ADL's : Needs assistance/impaired                                       General ADL Comments: MOD I bed level donning socks, CGA standing at sink for grooming foor approx 8 minutes; vcs required throughout for sequencing and engagement in task. CLOSE SUP for fxl mobility for RW      Cognition Arousal/Alertness: Awake/alert Behavior During Therapy: WFL for tasks assessed/performed Overall Cognitive Status: Within Functional Limits for tasks assessed                                 General Comments: alert and oriented, follows 1 step directives however requires frequent vcs for attention to task          Exercises General Exercises - Upper  Extremity Elbow Flexion: Strengthening;10 reps;Theraband;Both Theraband Level (Elbow Flexion): Level 1 (Yellow) Elbow Extension: Strengthening;10 reps;Limitations;Theraband;Both Theraband Level (Elbow Extension): Level 1 (Yellow) Elbow Extension Limitations: in preparation for ADL tasks Wrist Flexion: Both;10 reps;Self ROM Wrist Extension: Self ROM;10 reps;Seated Other Exercises Other Exercises: AAROM pronation/supination L wrist 10x with vcs for technique   Shoulder Instructions       General Comments      Pertinent Vitals/ Pain       Pain Assessment: No/denies pain                                                           Frequency  Min 1X/week        Progress Toward Goals  OT Goals(current goals can now be found in the care plan section)  Progress towards OT goals: Progressing toward goals  Acute Rehab OT Goals OT Goal Formulation: With patient  Plan Frequency remains appropriate;Discharge plan remains appropriate    Co-evaluation                 AM-PAC OT "6 Clicks" Daily Activity     Outcome Measure   Help from another person eating meals?: None Help from another person taking care of personal grooming?: A Little Help from another person toileting, which includes using toliet, bedpan, or urinal?: A Little Help from another person bathing (including washing, rinsing, drying)?: A Little Help from another person to put on and taking off regular upper body clothing?: A Little Help from another person to put on and taking off regular lower body clothing?: A Little 6 Click Score: 19    End of Session Equipment Utilized During Treatment: Rolling walker (2 wheels)  OT Visit Diagnosis: Unsteadiness on feet (R26.81);Muscle weakness (generalized) (M62.81);History of falling (Z91.81);Adult, failure to thrive (R62.7)   Activity Tolerance Patient tolerated treatment well   Patient Left in bed;with call bell/phone within reach;with bed alarm set   Nurse Communication Mobility status        Time: 8309-4076 OT Time Calculation (min): 27 min  Charges: OT General Charges $OT Visit: 1 Visit OT Treatments $Self Care/Home Management : 8-22 mins $Therapeutic Exercise: 8-22 mins  Shanon Payor, OTD OTR/L  08/13/21, 3:45 PM

## 2021-08-13 NOTE — Progress Notes (Signed)
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  Pt recently here for multiple months with difficult d/c planning, did ultimatley d/c to homeless shelter and was gone only a few hours before fall with L ankle and wrist fractures with return to the ED.    PT Comments    Pt received in Semi-Fowler's position and agreeable to therapy.  Pt reports he has already been up walking with therapy prior to PT coming in.  Pt stated that he would still ambulate and perform mobility training with therapist in order to get stronger.  Pt requiring less assistance, only needed minA for coming upright into standing position and CGA for ambulation and management of brief.  Pt able to ambulate around nursing station with good technique and good transfer back to bed.  Pt does report fatigue, but continues to be safe throughout session even when fatigued.  Current discharge plans to Home with HHPT remain appropriate at this time.  Pt will continue to benefit from skilled therapy in order to address deficits listed below.      Recommendations for follow up therapy are one component of a multi-disciplinary discharge planning process, led by the attending physician.  Recommendations may be updated based on patient status, additional functional criteria and insurance authorization.  Follow Up Recommendations  Home health PT     Assistance Recommended at Discharge Intermittent Supervision/Assistance  Equipment Recommendations  None recommended by PT    Recommendations for Other Services       Precautions / Restrictions Precautions Precautions: Fall Restrictions Weight Bearing Restrictions:  Yes LUE Weight Bearing: Weight bearing as tolerated LLE Weight Bearing: Weight bearing as tolerated     Mobility  Bed Mobility Overal bed mobility: Modified Independent                  Transfers Overall transfer level: Needs assistance Equipment used: Rolling walker (2 wheels);Left platform walker Transfers: Sit to/from Stand;Stand Pivot Transfers Sit to Stand: Min assist;From elevated surface                Ambulation/Gait Ambulation/Gait assistance: Min guard Gait Distance (Feet): 175 Feet Assistive device: Rolling walker (2 wheels);Left platform walker Gait Pattern/deviations: Trunk flexed;Decreased stride length Gait velocity: decreased   General Gait Details: Pt with good technique and not requiring a lot of rest at this time.   Stairs             Wheelchair Mobility    Modified Rankin (Stroke Patients Only)       Balance Overall balance assessment: Needs assistance Sitting-balance support: Feet supported;No upper extremity supported Sitting balance-Leahy Scale: Good Sitting balance - Comments: good sitting balance with dynamic tasks at edge of bed   Standing balance support: Bilateral upper extremity supported;During functional activity Standing balance-Leahy Scale: Fair                              Cognition Arousal/Alertness: Awake/alert Behavior During Therapy: WFL for tasks assessed/performed Overall Cognitive Status: Within Functional Limits for tasks assessed  Exercises      General Comments        Pertinent Vitals/Pain Pain Assessment: No/denies pain    Home Living                          Prior Function            PT Goals (current goals can now be found in the care plan section) Acute Rehab PT Goals Patient Stated Goal: to get stronger PT Goal Formulation: With patient Time For Goal Achievement: 08/26/21 Potential to Achieve  Goals: Fair Progress towards PT goals: Progressing toward goals    Frequency    Min 2X/week      PT Plan Current plan remains appropriate    Co-evaluation              AM-PAC PT "6 Clicks" Mobility   Outcome Measure  Help needed turning from your back to your side while in a flat bed without using bedrails?: None Help needed moving from lying on your back to sitting on the side of a flat bed without using bedrails?: None Help needed moving to and from a bed to a chair (including a wheelchair)?: A Little Help needed standing up from a chair using your arms (e.g., wheelchair or bedside chair)?: A Little Help needed to walk in hospital room?: A Little Help needed climbing 3-5 steps with a railing? : A Lot 6 Click Score: 19    End of Session Equipment Utilized During Treatment: Gait belt Activity Tolerance: Patient tolerated treatment well Patient left: in bed;with call bell/phone within reach;with bed alarm set (sitting up on edge of bed)   PT Visit Diagnosis: Unsteadiness on feet (R26.81);Muscle weakness (generalized) (M62.81);Difficulty in walking, not elsewhere classified (R26.2);History of falling (Z91.81)     Time: 0071-2197 PT Time Calculation (min) (ACUTE ONLY): 28 min  Charges:  $Gait Training: 8-22 mins $Therapeutic Activity: 8-22 mins                     Gwenlyn Saran, PT, DPT 08/13/21, 2:55 PM    Christie Nottingham 08/13/2021, 2:53 PM

## 2021-08-14 NOTE — Progress Notes (Signed)
PROGRESS NOTE    Ryan [name]  MGQ:[id number] DOB: Mar 30, 1961 DOA: 06/02/2021 PCP: Pcp, No   Assessment & Plan:   Principal Problem:   Closed left ankle fracture Active Problems:   Fall   Anemia of chronic disease   Protein-calorie malnutrition, severe ([place])   Cancer cachexia ([place])   Prostate cancer metastatic to bone ([place])   Left wrist fracture, closed, initial encounter   Hypocalcemia   Hyperkalemia   Closed fracture of left distal radius   Closed bimalleolar fracture of left ankle   Closed left ankle and wrist fracture: secondary to fall. Managed conservatively by orthopedic surgery. Splint has been removed.   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 Next dose of zometa on 11/18. Lupron q3 months. Continue on xtandi as per onco    Cancer cachexia/protein caloric malnutrition: severe. Continue on nutritional supplements    Anemia of chronic disease: H&H are labile    Insomnia: continue on seroquel qhs    DVT prophylaxis: xarelto  Code Status: DNR Family Communication:  Disposition Plan: waiting on SNF placement still   Level of care: Med-Surg  Status is: Inpatient  Remains inpatient appropriate because: stable for d/c. Still waiting on SNF placement     Consultants:    Procedures:   Antimicrobials:   Subjective: Pt c/o insomnia   Objective: Vitals:   08/13/21 1229 08/13/21 1229 08/13/21 1542 08/13/21 2007  BP: 124/65 124/65 121/64 (!) 110/59  Pulse: 80 87 82 76  Resp: 16 16 14 16   Temp: 97.9 F (36.6 C) 97.9 F (36.6 C) 98.1 F (36.7 C) 98.2 F (36.8 C)  TempSrc: Oral Oral Oral   SpO2:  100% 98% 99%  Weight:      Height:        Intake/Output Summary (Last 24 hours) at 08/14/2021 0656 Last data filed at 08/14/2021 0447 Gross per 24 hour  Intake 240 ml  Output 1325 ml  Net -1085 ml   Filed Weights   08/06/21 0500 08/07/21 0500 08/12/21 0653  Weight: 60.5 kg 60.1 kg 64.5 kg    Examination:  General exam: Appears calm &  comfortable. Frail appearing  Respiratory system: Clear breath sounds b/l Cardiovascular system: S1 & S2+. No rubs or clicks  Gastrointestinal system: Abd is soft, NT, ND & hypoactive bowel sounds  Central nervous system: Alert and oriented. Moves all extremities  Psychiatry: Judgement and insight appear normal. Appropriate mood and affect     Data Reviewed: I have personally reviewed following labs and imaging studies  CBC: Recent Labs  Lab 08/08/21 1018  WBC 6.0  HGB 7.8*  HCT 23.5*  MCV 77.0*  PLT 267   Basic Metabolic Panel: Recent Labs  Lab 08/08/21 1018  NA 130*  K 4.3  CL 103  CO2 21*  GLUCOSE 123*  BUN 9  CREATININE 0.69  CALCIUM 8.2*  MG 1.8   GFR: Estimated Creatinine Clearance: 90.7 mL/min (by C-G formula based on SCr of 0.69 mg/dL). Liver Function Tests: Recent Labs  Lab 08/08/21 1018  AST 32  ALT 7  ALKPHOS 1,000*  BILITOT 0.7  PROT 6.7  ALBUMIN 3.5   No results for input(s): LIPASE, AMYLASE in the last 168 hours. No results for input(s): AMMONIA in the last 168 hours. Coagulation Profile: No results for input(s): INR, PROTIME in the last 168 hours. Cardiac Enzymes: No results for input(s): CKTOTAL, CKMB, CKMBINDEX, TROPONINI in the last 168 hours. BNP (last 3 results) No results for input(s): PROBNP in the  last 8760 hours. HbA1C: No results for input(s): HGBA1C in the last 72 hours. CBG: No results for input(s): GLUCAP in the last 168 hours. Lipid Profile: No results for input(s): CHOL, HDL, LDLCALC, TRIG, CHOLHDL, LDLDIRECT in the last 72 hours. Thyroid Function Tests: No results for input(s): TSH, T4TOTAL, FREET4, T3FREE, THYROIDAB in the last 72 hours. Anemia Panel: No results for input(s): VITAMINB12, FOLATE, FERRITIN, TIBC, IRON, RETICCTPCT in the last 72 hours. Sepsis Labs: No results for input(s): PROCALCITON, LATICACIDVEN in the last 168 hours.  No results found for this or any previous visit (from the past 240 hour(s)).        Radiology Studies: No results found.      Scheduled Meds:  calcium carbonate  1,250 mg Oral TID WC   enzalutamide  160 mg Oral Daily   feeding supplement  237 mL Oral TID BM   magnesium oxide  400 mg Oral Daily   midodrine  5 mg Oral TID WC   mirtazapine  15 mg Oral QHS   multivitamin with minerals  1 tablet Oral Daily   QUEtiapine  100 mg Oral QHS   rivaroxaban  10 mg Oral Q supper   senna-docusate  1 tablet Oral BID   Continuous Infusions:  sodium chloride Stopped (06/04/21 1216)     LOS: 72 days    Time spent: 15 mins     Wyvonnia Dusky, MD [facility] Pager [phone]  If 7PM-7AM, please contact night-coverage 08/14/2021, 6:56 AM

## 2021-08-15 NOTE — Progress Notes (Signed)
PROGRESS NOTE    Ryan [name]  RSW:[id number] DOB: 1961/09/09 DOA: 06/02/2021 PCP: Pcp, No   Assessment & Plan:   Principal Problem:   Closed left ankle fracture Active Problems:   Fall   Anemia of chronic disease   Protein-calorie malnutrition, severe ([place])   Cancer cachexia ([place])   Prostate cancer metastatic to bone ([place])   Left wrist fracture, closed, initial encounter   Hypocalcemia   Hyperkalemia   Closed fracture of left distal radius   Closed bimalleolar fracture of left ankle   Closed left ankle and wrist fracture: secondary to fall. Managed conservatively by orthopedic surgery. Splint has been removed.   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 Next dose of zometa on 11/18. Lupron q3 months. Continue on xtandi as per onco    Cancer cachexia/protein caloric malnutrition: severe. Continue on nutritional supplements    Anemia of chronic disease: H&H are labile. No need for a transfusion currently    Insomnia: continue on seroquel qhs    DVT prophylaxis: xarelto  Code Status: DNR Family Communication:  Disposition Plan: waiting on SNF placement still   Level of care: Med-Surg  Status is: Inpatient  Remains inpatient appropriate because: stable for d/c. Still waiting on SNF placement     Consultants:    Procedures:   Antimicrobials:   Subjective: Pt c/o fatigue   Objective: Vitals:   08/14/21 1100 08/14/21 1529 08/14/21 2026 08/15/21 0502  BP: (!) 120/55 120/62 (!) 117/58 (!) 117/52  Pulse: 80 80 74 81  Resp: 16 16 20 20   Temp: 98.1 F (36.7 C) 98.1 F (36.7 C) 98.1 F (36.7 C) 98.3 F (36.8 C)  TempSrc: Oral Oral    SpO2: 100% 99% 97% 98%  Weight:      Height:        Intake/Output Summary (Last 24 hours) at 08/15/2021 0741 Last data filed at 08/15/2021 0500 Gross per 24 hour  Intake 360 ml  Output 1500 ml  Net -1140 ml   Filed Weights   08/06/21 0500 08/07/21 0500 08/12/21 0653  Weight: 60.5 kg 60.1 kg 64.5 kg     Examination:  General exam: Appears comfortable. Frail appearing  Respiratory system: clear breath sounds b/l. No rales, rhonchi  Cardiovascular system: S1/S2+. No rubs or clicks  Gastrointestinal system: Abd is soft, NT, ND & hypoactive bowel sounds Central nervous system: Alert and oriented. Moves all extremities  Psychiatry: Judgement and insight appear normal. Appropriate mood and affect     Data Reviewed: I have personally reviewed following labs and imaging studies  CBC: Recent Labs  Lab 08/08/21 1018  WBC 6.0  HGB 7.8*  HCT 23.5*  MCV 77.0*  PLT 093   Basic Metabolic Panel: Recent Labs  Lab 08/08/21 1018  NA 130*  K 4.3  CL 103  CO2 21*  GLUCOSE 123*  BUN 9  CREATININE 0.69  CALCIUM 8.2*  MG 1.8   GFR: Estimated Creatinine Clearance: 90.7 mL/min (by C-G formula based on SCr of 0.69 mg/dL). Liver Function Tests: Recent Labs  Lab 08/08/21 1018  AST 32  ALT 7  ALKPHOS 1,000*  BILITOT 0.7  PROT 6.7  ALBUMIN 3.5   No results for input(s): LIPASE, AMYLASE in the last 168 hours. No results for input(s): AMMONIA in the last 168 hours. Coagulation Profile: No results for input(s): INR, PROTIME in the last 168 hours. Cardiac Enzymes: No results for input(s): CKTOTAL, CKMB, CKMBINDEX, TROPONINI in the last 168 hours. BNP (last 3 results)  No results for input(s): PROBNP in the last 8760 hours. HbA1C: No results for input(s): HGBA1C in the last 72 hours. CBG: No results for input(s): GLUCAP in the last 168 hours. Lipid Profile: No results for input(s): CHOL, HDL, LDLCALC, TRIG, CHOLHDL, LDLDIRECT in the last 72 hours. Thyroid Function Tests: No results for input(s): TSH, T4TOTAL, FREET4, T3FREE, THYROIDAB in the last 72 hours. Anemia Panel: No results for input(s): VITAMINB12, FOLATE, FERRITIN, TIBC, IRON, RETICCTPCT in the last 72 hours. Sepsis Labs: No results for input(s): PROCALCITON, LATICACIDVEN in the last 168 hours.  No results found for  this or any previous visit (from the past 240 hour(s)).       Radiology Studies: No results found.      Scheduled Meds:  calcium carbonate  1,250 mg Oral TID WC   enzalutamide  160 mg Oral Daily   feeding supplement  237 mL Oral TID BM   magnesium oxide  400 mg Oral Daily   midodrine  5 mg Oral TID WC   mirtazapine  15 mg Oral QHS   multivitamin with minerals  1 tablet Oral Daily   QUEtiapine  100 mg Oral QHS   rivaroxaban  10 mg Oral Q supper   senna-docusate  1 tablet Oral BID   Continuous Infusions:  sodium chloride Stopped (06/04/21 1216)     LOS: 73 days    Time spent: 15 mins     Wyvonnia Dusky, MD [facility] Pager [phone]  If 7PM-7AM, please contact night-coverage 08/15/2021, 7:41 AM

## 2021-08-16 NOTE — TOC Progression Note (Addendum)
Transition of Care [facility]) - Progression Note    Patient Details  Name: Ryan [name] MRN: [id number] Date of Birth: 12-05-1960  Transition of Care [facility]) CM/SW [place], RN Phone Number: 08/16/2021, 10:00 AM  Clinical Narrative:      Reached out to Hydia at [facility] in [place] at [phone] to see if they plan to do a face to face today with the patient, Awaiting a responce    Update 11/8 reached out again to Hydia at [facility] in [place] unable to reach her voice mail is full unable to leave a message  Expected Discharge Plan and Services                                                 Social Determinants of Health (SDOH) Interventions    Readmission Risk Interventions Readmission Risk Prevention Plan 04/23/2021  Transportation Screening Complete  PCP or Specialist Appt within 3-5 Days Complete  HRI or Home Care Consult Complete  Social Work Consult for [place] Planning/Counseling Not Complete  SW consult not completed comments RNCM assigned to patient  Palliative Care Screening Complete  Medication Review [employer]) Complete  Some recent data might be hidden

## 2021-08-16 NOTE — Progress Notes (Signed)
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  Pt recently here for multiple months with difficult d/c planning, did ultimatley d/c to homeless shelter and was gone only a few hours before fall with L ankle and wrist fractures with return to the ED.    PT Comments    Pt received supine in bed. Agreeable to participate on second PT attempt. Reports still feeling slightly nauseous and sleepy from his medications. Set up assist provided for donning socks but indep for donning.  Remains mod-I to transfer to EOB and minguard for STS to platform walker. Initially reports of minor dizziness with standing that subsides after standing for 30 sec. Tolerated ambulating 170' with supervision. Cuing provided on upright posture. Fair carryover. Declining LE therex post ambulation wishing to eat his lunch after ambulation. Reports fatigue post ambulation. D/c recs remains appropriate.   Recommendations for follow up therapy are one component of a multi-disciplinary discharge planning process, led by the attending physician.  Recommendations may be updated based on patient status, additional functional criteria and insurance authorization.  Follow Up Recommendations  Home health PT     Assistance Recommended at Discharge Intermittent Supervision/Assistance  Equipment Recommendations  None recommended by PT    Recommendations for Other Services       Precautions / Restrictions Precautions Precautions: Fall Precaution Comments: WBAT Required Braces or Orthoses: Splint/Cast Restrictions Weight Bearing Restrictions: Yes LUE Weight Bearing: Weight bearing as tolerated LLE Weight  Bearing: Weight bearing as tolerated     Mobility  Bed Mobility Overal bed mobility: Modified Independent Bed Mobility: Supine to Sit;Sit to Supine     Supine to sit: Modified independent (Device/Increase time) Sit to supine: Modified independent (Device/Increase time)        Transfers Overall transfer level: Needs assistance Equipment used: Rolling walker (2 wheels);Left platform walker Transfers: Sit to/from Stand Sit to Stand: Min guard                Ambulation/Gait Ambulation/Gait assistance: Supervision Gait Distance (Feet): 170 Feet Assistive device: Rolling walker (2 wheels);Left platform walker Gait Pattern/deviations: Trunk flexed;Decreased stride length           Stairs             Wheelchair Mobility    Modified Rankin (Stroke Patients Only)       Balance Overall balance assessment: Needs assistance Sitting-balance support: Feet supported;No upper extremity supported Sitting balance-Leahy Scale: Good     Standing balance support: Bilateral upper extremity supported;During functional activity Standing balance-Leahy Scale: Fair Standing balance comment: platform L side                            Cognition Arousal/Alertness: Awake/alert Behavior During Therapy: WFL for tasks assessed/performed Overall Cognitive Status: Within Functional Limits for tasks assessed                                          Exercises      General Comments        Pertinent Vitals/Pain Pain Assessment: No/denies pain  Home Living                          Prior Function            PT Goals (current goals can now be found in the care plan section) Acute Rehab PT Goals Patient Stated Goal: to get stronger PT Goal Formulation: With patient Time For Goal Achievement: 08/26/21 Potential to Achieve Goals: Fair Progress towards PT goals: Progressing toward goals    Frequency    Min 2X/week       PT Plan Current plan remains appropriate    Co-evaluation              AM-PAC PT "6 Clicks" Mobility   Outcome Measure  Help needed turning from your back to your side while in a flat bed without using bedrails?: None Help needed moving from lying on your back to sitting on the side of a flat bed without using bedrails?: None Help needed moving to and from a bed to a chair (including a wheelchair)?: A Little Help needed standing up from a chair using your arms (e.g., wheelchair or bedside chair)?: A Little Help needed to walk in hospital room?: A Little Help needed climbing 3-5 steps with a railing? : A Lot 6 Click Score: 19    End of Session Equipment Utilized During Treatment: Gait belt Activity Tolerance: Patient tolerated treatment well Patient left: in bed;with call bell/phone within reach;with bed alarm set Nurse Communication: Mobility status PT Visit Diagnosis: Unsteadiness on feet (R26.81);Muscle weakness (generalized) (M62.81);Difficulty in walking, not elsewhere classified (R26.2);History of falling (Z91.81)     Time: 7121-9758 PT Time Calculation (min) (ACUTE ONLY): 17 min  Charges:  $Gait Training: 8-22 mins            [name]. Fairly IV, PT, DPT Physical Therapist- [facility]  08/16/2021, 12:57 PM

## 2021-08-16 NOTE — Progress Notes (Signed)
PROGRESS NOTE    Ryan [name]  SEG:[id number] DOB: 07-16-1961 DOA: 06/02/2021 PCP: Pcp, No   Assessment & Plan:   Principal Problem:   Closed left ankle fracture Active Problems:   Fall   Anemia of chronic disease   Protein-calorie malnutrition, severe ([place])   Cancer cachexia ([place])   Prostate cancer metastatic to bone ([place])   Left wrist fracture, closed, initial encounter   Hypocalcemia   Hyperkalemia   Closed fracture of left distal radius   Closed bimalleolar fracture of left ankle   Closed left ankle and wrist fracture: secondary to fall. Managed conservatively by orthopedic surgery. Splint has been removed.   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 Next dose of zometa on 11/18. Lupron q15months. Continue on xtandi as per onco   Cancer cachexia/protein caloric malnutrition: severe. Continue on nutritional supplements   Anemia of chronic disease: H&H are labile.    Insomnia: continue on seroquel qhs    DVT prophylaxis: xarelto  Code Status: DNR Family Communication:  Disposition Plan: waiting on SNF placement still   Level of care: Med-Surg  Status is: Inpatient  Remains inpatient appropriate because: stable for d/c. Still waiting on SNF placement     Consultants:    Procedures:   Antimicrobials:   Subjective: Pt c/o nausea that improved after zofran   Objective: Vitals:   08/15/21 1537 08/15/21 2055 08/16/21 0449 08/16/21 0731  BP: (!) 124/55 (!) 120/52 (!) 110/53 (!) 118/56  Pulse: 83 75 76 78  Resp: 18 18 20    Temp: 98.2 F (36.8 C) 98.4 F (36.9 C) 98 F (36.7 C) 98 F (36.7 C)  TempSrc:      SpO2: 98% 99% 100% 99%  Weight:      Height:        Intake/Output Summary (Last 24 hours) at 08/16/2021 0747 Last data filed at 08/16/2021 0400 Gross per 24 hour  Intake --  Output 1625 ml  Net -1625 ml   Filed Weights   08/06/21 0500 08/07/21 0500 08/12/21 0653  Weight: 60.5 kg 60.1 kg 64.5 kg    Examination:  General exam: Appears  calm & comfortable. Frail appearing  Respiratory system: clear breath sounds b/l  Cardiovascular system: S1 & S2+. No rubs or clicks  Gastrointestinal system: Abd is soft, NT,ND & normal bowel sounds  Central nervous system: Alert and oriented. Moves all extremities Psychiatry: Judgement and insight appear normal. Appropriate mood and affect     Data Reviewed: I have personally reviewed following labs and imaging studies  CBC: No results for input(s): WBC, NEUTROABS, HGB, HCT, MCV, PLT in the last 168 hours.  Basic Metabolic Panel: No results for input(s): NA, K, CL, CO2, GLUCOSE, BUN, CREATININE, CALCIUM, MG, PHOS in the last 168 hours.  GFR: Estimated Creatinine Clearance: 90.7 mL/min (by C-G formula based on SCr of 0.69 mg/dL). Liver Function Tests: No results for input(s): AST, ALT, ALKPHOS, BILITOT, PROT, ALBUMIN in the last 168 hours.  No results for input(s): LIPASE, AMYLASE in the last 168 hours. No results for input(s): AMMONIA in the last 168 hours. Coagulation Profile: No results for input(s): INR, PROTIME in the last 168 hours. Cardiac Enzymes: No results for input(s): CKTOTAL, CKMB, CKMBINDEX, TROPONINI in the last 168 hours. BNP (last 3 results) No results for input(s): PROBNP in the last 8760 hours. HbA1C: No results for input(s): HGBA1C in the last 72 hours. CBG: No results for input(s): GLUCAP in the last 168 hours. Lipid Profile: No results for  input(s): CHOL, HDL, LDLCALC, TRIG, CHOLHDL, LDLDIRECT in the last 72 hours. Thyroid Function Tests: No results for input(s): TSH, T4TOTAL, FREET4, T3FREE, THYROIDAB in the last 72 hours. Anemia Panel: No results for input(s): VITAMINB12, FOLATE, FERRITIN, TIBC, IRON, RETICCTPCT in the last 72 hours. Sepsis Labs: No results for input(s): PROCALCITON, LATICACIDVEN in the last 168 hours.  No results found for this or any previous visit (from the past 240 hour(s)).       Radiology Studies: No results  found.      Scheduled Meds:  calcium carbonate  1,250 mg Oral TID WC   enzalutamide  160 mg Oral Daily   feeding supplement  237 mL Oral TID BM   magnesium oxide  400 mg Oral Daily   midodrine  5 mg Oral TID WC   mirtazapine  15 mg Oral QHS   multivitamin with minerals  1 tablet Oral Daily   QUEtiapine  100 mg Oral QHS   rivaroxaban  10 mg Oral Q supper   senna-docusate  1 tablet Oral BID   Continuous Infusions:  sodium chloride Stopped (06/04/21 1216)     LOS: 74 days    Time spent: 10 mins     Wyvonnia Dusky, MD [facility] Pager [phone]  If 7PM-7AM, please contact night-coverage 08/16/2021, 7:47 AM

## 2021-08-16 NOTE — Progress Notes (Signed)
PT Cancellation Note  Patient Details Name: Raghav Verrilli MRN: [id number] DOB: 10-25-1960   Cancelled Treatment:    Reason Eval/Treat Not Completed: Fatigue/lethargy limiting ability to participate. Per pt, nausea medication has made him sleepy. Requesting PT to return later in am. Will re-attempt as available.   [name]. Fairly IV, PT, DPT Physical Therapist- [facility]  08/16/2021, 10:36 AM

## 2021-08-16 NOTE — Progress Notes (Signed)
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  Pt recently here for multiple months with difficult d/c planning, did ultimatley d/c to homeless shelter and was gone only a few hours before fall with L ankle and wrist fractures with return to the ED.   OT comments  Ryan [name] continues to make progress towards his goals. During today's session, he engaged in UB and LB dressing, doffing hospital gown and donning over-the-head shirt and pants, with Mod I. Repeated sit<>stand with RW, displaying good sitting balance, fair standing balance during fxl mobility. Practiced a range of therapeutic activities using L UE, displaying limited but improving ROM in finger and wrist flexion/extension, reporting mild-mod pain. Provided ice pack. Updating DC recs to ALF/HHOT.   Recommendations for follow up therapy are one component of a multi-disciplinary discharge planning process, led by the attending physician.  Recommendations may be updated based on patient status, additional functional criteria and insurance authorization.    Follow Up Recommendations  Home health OT    Assistance Recommended at Discharge Frequent or constant Supervision/Assistance  Equipment Recommendations  Tub/shower seat    Recommendations for Other Services      Precautions / Restrictions Precautions Precautions: Fall Precaution Comments: WBAT Required Braces or Orthoses: Splint/Cast Spinal Brace Comments: pt no longer using TLSO brace Splint/Cast: pt no longer wearing L LE or L UE splint Other Brace: pt no longer using LLE post-op shoe Restrictions Weight Bearing Restrictions: Yes LUE Weight  Bearing: Weight bearing as tolerated LLE Weight Bearing: Weight bearing as tolerated Other Position/Activity Restrictions: splints removed 10/5, pt WBAT to L LE well, still prefers use of platform for L UE on RW       Mobility Bed Mobility Overal bed mobility: Modified Independent Bed Mobility: Supine to Sit;Sit to Supine     Supine to sit: Modified independent (Device/Increase time) Sit to supine: Modified independent (Device/Increase time)   General bed mobility comments: increased time, no physical assistance required    Transfers Overall transfer level: Needs assistance Equipment used: Rolling walker (2 wheels) Transfers: Sit to/from Stand Sit to Stand: Min guard           General transfer comment: Increased time, close supervision for sit<>stand     Balance Overall balance assessment: Needs assistance Sitting-balance support: Feet supported;No upper extremity supported Sitting balance-Leahy Scale: Good Sitting balance - Comments: with fatique, displays L lateral lean Postural control: Left lateral lean Standing balance support: Bilateral upper extremity supported;During functional activity Standing balance-Leahy Scale: Fair Standing balance comment: platform L side                           ADL either performed or assessed with clinical judgement   ADL Overall ADL's : Needs assistance/impaired Eating/Feeding: Independent;Sitting Eating/Feeding Details (indicate cue type and reason): increased attempts to use LUE for eating toady             Upper Body Dressing : Set up;Sitting;Modified independent   Lower Body Dressing: Modified independent;Set up;Bed level Lower Body Dressing Details (indicate cue type and reason): uses bridging technique to don pants  General ADL Comments: Mod I/SUPV for LB, UB dressing. VCs and frequent redirection required for attention to tasks    Extremity/Trunk Assessment Upper Extremity  Assessment Upper Extremity Assessment: Generalized weakness;LUE deficits/detail LUE Deficits / Details: Limited finger flexion   Lower Extremity Assessment Lower Extremity Assessment: Generalized weakness   Cervical / Trunk Assessment Cervical / Trunk Assessment: Normal    Vision       Perception     Praxis      Cognition Arousal/Alertness: Awake/alert Behavior During Therapy: WFL for tasks assessed/performed Overall Cognitive Status: Within Functional Limits for tasks assessed                                            Exercises Other Exercises Other Exercises: Dynamic sitting balance tolerance; ADL training in sitting using L UE; UB, LB dressing   Shoulder Instructions       General Comments      Pertinent Vitals/ Pain       Pain Assessment: 0-10 Pain Score: 4  Pain Location: L wrist, fingers Pain Descriptors / Indicators: Aching Pain Intervention(s): Repositioned;Monitored during session;Ice applied  Home Living                                          Prior Functioning/Environment              Frequency  Min 1X/week        Progress Toward Goals  OT Goals(current goals can now be found in the care plan section)  Progress towards OT goals: Progressing toward goals  Acute Rehab OT Goals OT Goal Formulation: With patient Time For Goal Achievement: 09/06/21 Potential to Achieve Goals: Good  Plan Frequency remains appropriate;Discharge plan needs to be updated    Co-evaluation                 AM-PAC OT "6 Clicks" Daily Activity     Outcome Measure   Help from another person eating meals?: None Help from another person taking care of personal grooming?: A Little Help from another person toileting, which includes using toliet, bedpan, or urinal?: A Little Help from another person bathing (including washing, rinsing, drying)?: A Little Help from another person to put on and taking off regular upper  body clothing?: A Little Help from another person to put on and taking off regular lower body clothing?: A Little 6 Click Score: 19    End of Session Equipment Utilized During Treatment: Rolling walker (2 wheels)  OT Visit Diagnosis: Unsteadiness on feet (R26.81);Muscle weakness (generalized) (M62.81);History of falling (Z91.81);Adult, failure to thrive (R62.7) Pain - Right/Left: Left Pain - part of body: Arm;Hand   Activity Tolerance Patient tolerated treatment well   Patient Left in bed;with call bell/phone within reach;with bed alarm set   Nurse Communication          Time: 2035-5974 OT Time Calculation (min): 24 min  Charges: OT General Charges $OT Visit: 1 Visit OT Treatments $Self Care/Home Management : 23-37 mins  Josiah Lobo, PhD, MS, OTR/L 08/16/21, 3:00 PM

## 2021-08-17 NOTE — Progress Notes (Signed)
Nutrition Follow-up  DOCUMENTATION CODES:  Severe malnutrition in context of chronic illness, Underweight  INTERVENTION:  Continue regular diet.  Continue Ensure TID.  Continue double protein portions with meals.  Continue Magic Cup TID.  Continue MVI with minerals daily.  NUTRITION DIAGNOSIS:  Severe Malnutrition related to chronic illness, cancer and cancer related treatments as evidenced by severe fat depletion, severe muscle depletion. - ongoing  GOAL:  Patient will meet greater than or equal to 90% of their needs. - progressing  MONITOR:  PO intake, Supplement acceptance, Labs, Weight trends, I & O's  REASON FOR ASSESSMENT:  Malnutrition Screening Tool    ASSESSMENT: 60 yo male with a PMH of prostate cancer metastasized to bone, cancer cachexia, anemia, depression, and anxiety. Recently admitted 6/21-8/24. Discharged to homeless shelter then returned to ED a few hours later after a fall and pain to his wrist.  Pt's intake remains variable, but stable with a range of 0-100%, mostly 100%, over the past 7 meals, indicating continued improved intake.  Admit wt: 52 kg Current wt: 64.5 kg  Pt continues to gain weight appropriately.  Continue current nutrition plan.  Pt may benefit from updated NFPE during next visit to determine if muscle and fat stores are more filled in.  Supplements: Ensure TID, Magic Cup TID, Double protein portions with meals  Medications: reviewed; Os-Cal TID, Mag-Ox, midodrine TID, Remeron, MVI with minerals, Senokot  Labs: reviewed; Na 130 (L), Glucose 123 (H)  Diet Order:   Diet Order             Diet regular Room service appropriate? Yes; Fluid consistency: Thin  Diet effective now                  EDUCATION NEEDS:  Education needs have been addressed  Skin:  Skin Assessment: Reviewed RN Assessment  Last BM:  08/16/21  Height:  Ht Readings from Last 1 Encounters:  07/06/21 6\' 5"  (1.956 m)   Weight:  Wt Readings from  Last 1 Encounters:  08/12/21 64.5 kg   BMI:  Body mass index is 16.86 kg/m.  Estimated Nutritional Needs:  Kcal:  2400-2600 Protein:  130-150 grams Fluid:  >2.4 L  Derrel Nip, RD, LDN (she/her/hers) Clinical Inpatient Dietitian RD Pager/After-Hours/Weekend Pager # in [place]

## 2021-08-17 NOTE — Progress Notes (Signed)
PROGRESS NOTE   HPI was taken from Ryan [name]: Ryan [name] is a 60 y.o. 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   Patient was recently hospitalized from 6/21-8/24 due to metastasized prostate cancer. Pt was seen by Ryan [name] of oncology. He got his last prostate cancer treatment yesterday. He was just discharged from the hospital yesterday. He was supposed to go to a homeless shelter but apparently there were issues when he was transported there. He sates that he he felt dizzy and fell accidentally. No LOC. He injured his left wrist and left ankle, causing severe pain in left ankle and left wrist.  The pain is constant, sharp, severe, nonradiating, aggravated by movement. Patient does not have chest pain, shortness breath, cough, fever or chills.  No nausea, vomiting, diarrhea or abdominal pain.  No symptoms of UTI.  No numbness in extremities.  No facial droop or slurred speech.   ED Course: pt was found to have WBC 10.2, lipase 151, electrolytes renal function okay, temperature normal, blood pressure 98/72, 120/76, heart rate in 98, 76, RR 22, oxygen saturation 95% on room air.  Patient is admitted to [place] bed as inpatient.  As per Ryan [name]: 60 year old man PMH metastatic prostate cancer presented with a fall resulting in closed left ankle fracture and left wrist fracture.  This was treated with conservative management.  Hospitalization uncomplicated.  Discharge delayed by difficulty with disposition.  Remains medically stable for discharge.  Accepting facility has some concern about the start date of his disability.   Patient received dose of Zometa on 07/30/2021.  Oncology repeated the PSA which seems rising at 1199 from 184. Patient wants to talk with his oncologist, talk with Ryan [name], he will try to see him soon. 10/25: Ryan [name] gone reordered a  repeat PSA to confirm, we will see him after that resulted.   As per Ryan [name] 11/3-11/8/22: Pt remained medically stable this week. CM is still working on safe d/c plan.    Ryan [name]  XTG:[id number] DOB: [birth year] DOA: 06/02/2021 PCP: Pcp, No   Assessment & Plan:   Principal Problem:   Closed left ankle fracture Active Problems:   Fall   Anemia of chronic disease   Protein-calorie malnutrition, severe ([place])   Cancer cachexia ([place])   Prostate cancer metastatic to bone ([place])   Left wrist fracture, closed, initial encounter   Hypocalcemia   Hyperkalemia   Closed fracture of left distal radius   Closed bimalleolar fracture of left ankle   Closed left ankle and wrist fracture: secondary to fall. Managed conservatively by orthopedic surgery. Splint has been removed.   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 Next dose of zometa on 11/18. Lupron q68months. Continue on xtandi as per onco   Cancer cachexia/protein caloric malnutrition: severe. Continue on nutritional supplements   Anemia of chronic disease: H&H are labile.    Insomnia: continue on seroquel qhs    DVT prophylaxis: xarelto  Code Status: DNR Family Communication:  Disposition Plan: waiting on SNF placement still   Level of care: Med-Surg  Status is: Inpatient  Remains inpatient appropriate because: stable for d/c. Still waiting on SNF placement     Consultants:    Procedures:   Antimicrobials:   Subjective: Pt denied any complaints this morning  Objective: Vitals:   08/16/21 2336 08/17/21 0508 08/17/21 0515 08/17/21 0738  BP: 118/68 Marland Kitchen)  117/59 (!) 117/59 (!) 112/57  Pulse: 87 86 83 83  Resp: 16 16 16 16   Temp: 98.2 F (36.8 C) 98.4 F (36.9 C) 98.4 F (36.9 C) 98.2 F (36.8 C)  TempSrc:      SpO2: 100% 98%  98%  Weight:      Height:        Intake/Output Summary (Last 24 hours) at 08/17/2021 0911 Last data filed at 08/17/2021 0516 Gross per 24 hour  Intake --  Output 2300 ml   Net -2300 ml   Filed Weights   08/06/21 0500 08/07/21 0500 08/12/21 0653  Weight: 60.5 kg 60.1 kg 64.5 kg    Examination:  General exam: Appears calm & comfortable. Frail appearing  Respiratory system: clear breath sounds b/l  Cardiovascular system: S1/S2+. No rubs or clicks  Gastrointestinal system: Abd is soft, NT, ND & normal bowel sounds   Central nervous system:Alert and oriented. Moves all extremities  Psychiatry: judgement and insight appear normal. Appropriate mood and affect     Data Reviewed: I have personally reviewed following labs and imaging studies  CBC: No results for input(s): WBC, NEUTROABS, HGB, HCT, MCV, PLT in the last 168 hours.  Basic Metabolic Panel: No results for input(s): NA, K, CL, CO2, GLUCOSE, BUN, CREATININE, CALCIUM, MG, PHOS in the last 168 hours.  GFR: Estimated Creatinine Clearance: 90.7 mL/min (by C-G formula based on SCr of 0.69 mg/dL). Liver Function Tests: No results for input(s): AST, ALT, ALKPHOS, BILITOT, PROT, ALBUMIN in the last 168 hours.  No results for input(s): LIPASE, AMYLASE in the last 168 hours. No results for input(s): AMMONIA in the last 168 hours. Coagulation Profile: No results for input(s): INR, PROTIME in the last 168 hours. Cardiac Enzymes: No results for input(s): CKTOTAL, CKMB, CKMBINDEX, TROPONINI in the last 168 hours. BNP (last 3 results) No results for input(s): PROBNP in the last 8760 hours. HbA1C: No results for input(s): HGBA1C in the last 72 hours. CBG: No results for input(s): GLUCAP in the last 168 hours. Lipid Profile: No results for input(s): CHOL, HDL, LDLCALC, TRIG, CHOLHDL, LDLDIRECT in the last 72 hours. Thyroid Function Tests: No results for input(s): TSH, T4TOTAL, FREET4, T3FREE, THYROIDAB in the last 72 hours. Anemia Panel: No results for input(s): VITAMINB12, FOLATE, FERRITIN, TIBC, IRON, RETICCTPCT in the last 72 hours. Sepsis Labs: No results for input(s): PROCALCITON, LATICACIDVEN  in the last 168 hours.  No results found for this or any previous visit (from the past 240 hour(s)).       Radiology Studies: No results found.      Scheduled Meds:  calcium carbonate  1,250 mg Oral TID WC   enzalutamide  160 mg Oral Daily   feeding supplement  237 mL Oral TID BM   magnesium oxide  400 mg Oral Daily   midodrine  5 mg Oral TID WC   mirtazapine  15 mg Oral QHS   multivitamin with minerals  1 tablet Oral Daily   QUEtiapine  100 mg Oral QHS   rivaroxaban  10 mg Oral Q supper   senna-docusate  1 tablet Oral BID   Continuous Infusions:  sodium chloride Stopped (06/04/21 1216)     LOS: 75 days    Time spent: 10 mins     Wyvonnia Dusky, MD [facility] Pager [phone]  If 7PM-7AM, please contact night-coverage 08/17/2021, 9:11 AM

## 2021-08-18 ENCOUNTER — Other Ambulatory Visit: Payer: Self-pay

## 2021-08-18 LAB — BASIC METABOLIC PANEL
Anion gap: 8 (ref 5–15)
BUN: 16 mg/dL (ref 6–20)
CO2: 21 mmol/L — ABNORMAL LOW (ref 22–32)
Calcium: 8.6 mg/dL — ABNORMAL LOW (ref 8.9–10.3)
Chloride: 104 mmol/L (ref 98–111)
Creatinine, Ser: 0.81 mg/dL (ref 0.61–1.24)
GFR, Estimated: >60 mL/min (ref >60)
Glucose, Bld: 109 mg/dL — ABNORMAL HIGH (ref 70–99)
Potassium: 4.8 mmol/L (ref 3.5–5.1)
Sodium: 133 mmol/L — ABNORMAL LOW (ref 135–145)

## 2021-08-18 LAB — CBC
HCT: 26.6 % — ABNORMAL LOW (ref 39.0–52.0)
Hemoglobin: 8 g/dL — ABNORMAL LOW (ref 13.0–17.0)
MCH: 23.4 pg — ABNORMAL LOW (ref 26.0–34.0)
MCHC: 30.1 g/dL (ref 30.0–36.0)
MCV: 77.8 fL — ABNORMAL LOW (ref 80.0–100.0)
Platelets: 278 10*3/uL (ref 150–400)
RBC: 3.42 MIL/uL — ABNORMAL LOW (ref 4.22–5.81)
RDW: 14.3 % (ref 11.5–15.5)
WBC: 6.1 10*3/uL (ref 4.0–10.5)
nRBC: 0 % (ref 0.0–0.2)

## 2021-08-18 NOTE — Progress Notes (Signed)
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  Pt recently here for multiple months with difficult d/c planning, did ultimatley d/c to homeless shelter and was gone only a few hours before fall with L ankle and wrist fractures with return to the ED.    PT Comments    Pt received upright in bed with OT finishing their session. Pt remains ISQ with minguard to stand to RW (L platform) and performed 1 lap around nurses station. Cuing throughout amb for improving upright posture to reduce UE weightbearing on RW and to promote greater strength gains on LE's with only fair carryover as pt fatigues. Pt returned to room performing on L ankle x10 Ankle pumps, inversion/eversion, and CW/CWW ankle circles for AROM and strength gains. D/c recs remain appropriate.   Recommendations for follow up therapy are one component of a multi-disciplinary discharge planning process, led by the attending physician.  Recommendations may be updated based on patient status, additional functional criteria and insurance authorization.  Follow Up Recommendations  Home health PT     Assistance Recommended at Discharge Intermittent Supervision/Assistance  Equipment Recommendations  None recommended by PT    Recommendations for Other Services       Precautions / Restrictions Precautions Precautions: Fall Precaution Comments: WBAT Required Braces or Orthoses: Splint/Cast Spinal Brace Comments: pt no longer using TLSO brace Splint/Cast: pt no longer wearing L LE or L UE splint Other Brace: pt no longer using LLE post-op shoe Restrictions Weight Bearing Restrictions: Yes LUE Weight Bearing:  Weight bearing as tolerated LLE Weight Bearing: Weight bearing as tolerated Other Position/Activity Restrictions: splints removed 10/5, pt WBAT to L LE well, still prefers use of platform for L UE on RW     Mobility  Bed Mobility Overal bed mobility: Modified Independent Bed Mobility: Supine to Sit     Supine to sit: Modified independent (Device/Increase time)     General bed mobility comments: increased time, no physical assistance required Patient Response: Cooperative  Transfers Overall transfer level: Needs assistance Equipment used: Rolling walker (2 wheels) Transfers: Sit to/from Stand Sit to Stand: Min guard           General transfer comment: use of momentum to assist in standing with bed slightly elevated    Ambulation/Gait Ambulation/Gait assistance: Supervision Gait Distance (Feet): 170 Feet Assistive device: Rolling walker (2 wheels);Left platform walker Gait Pattern/deviations: Trunk flexed;Decreased stride length       General Gait Details: Cuing to promote upright posture for reduced reliance on UE's to help LE strength   Stairs             Wheelchair Mobility    Modified Rankin (Stroke Patients Only)       Balance Overall balance assessment: Needs assistance Sitting-balance support: Feet supported;No upper extremity supported Sitting balance-Leahy Scale: Good     Standing balance support: Bilateral upper extremity supported;During functional activity Standing balance-Leahy Scale: Fair Standing balance comment: platform L side                            Cognition Arousal/Alertness: Awake/alert Behavior During Therapy: WFL for tasks assessed/performed Overall Cognitive Status: Within Functional Limits  for tasks assessed                                          Exercises Other Exercises Other Exercises: L ankle circles (Cw/CCW), DF/PF, Inversion/eversion x20 2-3x/day for ankle mobility and  strengthening    General Comments        Pertinent Vitals/Pain Pain Assessment: No/denies pain    Home Living                          Prior Function            PT Goals (current goals can now be found in the care plan section) Acute Rehab PT Goals Patient Stated Goal: to get stronger PT Goal Formulation: With patient Time For Goal Achievement: 08/26/21 Potential to Achieve Goals: Fair Progress towards PT goals: Progressing toward goals    Frequency    Min 2X/week      PT Plan Current plan remains appropriate    Co-evaluation              AM-PAC PT "6 Clicks" Mobility   Outcome Measure  Help needed turning from your back to your side while in a flat bed without using bedrails?: None Help needed moving from lying on your back to sitting on the side of a flat bed without using bedrails?: None Help needed moving to and from a bed to a chair (including a wheelchair)?: A Little Help needed standing up from a chair using your arms (e.g., wheelchair or bedside chair)?: A Little Help needed to walk in hospital room?: A Little Help needed climbing 3-5 steps with a railing? : A Lot 6 Click Score: 19    End of Session Equipment Utilized During Treatment: Gait belt Activity Tolerance: Patient tolerated treatment well Patient left: in bed;with call bell/phone within reach;with bed alarm set Nurse Communication: Mobility status PT Visit Diagnosis: Unsteadiness on feet (R26.81);Muscle weakness (generalized) (M62.81);Difficulty in walking, not elsewhere classified (R26.2);History of falling (Z91.81)     Time: 1215-1227 PT Time Calculation (min) (ACUTE ONLY): 12 min  Charges:  $Therapeutic Exercise: 8-22 mins                    [name]. Fairly IV, PT, DPT Physical Therapist- [facility]  08/18/2021, 1:27 PM

## 2021-08-18 NOTE — Progress Notes (Signed)
PROGRESS NOTE    Ryan [name]  ZDG:[id number] DOB: 1961-07-07 DOA: 06/02/2021 PCP: Pcp, No  150A/150A-AA   Assessment & Plan:   Principal Problem:   Closed left ankle fracture Active Problems:   Fall   Anemia of chronic disease   Protein-calorie malnutrition, severe ([facility])   Cancer cachexia ([facility])   Prostate cancer metastatic to bone ([facility])   Left wrist fracture, closed, initial encounter   Hypocalcemia   Hyperkalemia   Closed fracture of left distal radius   Closed bimalleolar fracture of left ankle   60 year old man PMH metastatic prostate cancer presented with a fall resulting in closed left ankle fracture and left wrist fracture.  This was treated with conservative management.  Hospitalization uncomplicated.  Discharge delayed by difficulty with disposition.  Remains medically stable for discharge.  Accepting facility has some concern about the start date of his disability.   Patient received dose of Zometa on 07/30/2021.  Oncology repeated the PSA which seems rising at 1199 from 184. Patient wants to talk with his oncologist, talk with [name], he will try to see him soon. 10/25: Dr. Marc Morgans gone reordered a repeat PSA to confirm, we will see him after that resulted.     Closed left ankle and wrist fracture.  Secondary to fall.  Managed conservatively by orthopedic surgery.  Splint has been removed.  Able to work with PT and weightbearing.   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   Being followed up by oncology as an outpatient.  Patient received his Zometa today, next dose will be on November 18. Getting Lupron every 29-month, prior dose was approximately a month ago. Repeat PSA done yesterday by oncology with significant rise to 1199 from 184 a month ago. --started on Xtandi on 08/05/21. Plan: --cont p.o. Xtandi 160 mg daily indefinitely, per oncology --Continue monthly Zometa with his next dose due on approximately August 27, 2021. --Lupron q44months.     Cancer cachexia/protein caloric malnutrition, severe. -Encourage p.o. intake with supplements.   Anemia of chronic disease. -Stable  Insomnia --Pt often complains of not being able to sleep at night.  Pt is noted to be sleeping a lot during the day.  Nightly trazodone had stopped working.  Switched to seroquel nightly. --cont seroquel 100 mg nightly for now. --trazodone 150 mg nightly PRN   DVT prophylaxis: RJ:JOACZYS  Code Status: DNR  Family Communication:  Level of care: Med-Surg Dispo:   The patient is from: has been in the hospital for the past year Anticipated d/c is to: SNF Anticipated d/c date is: whenever bed available  Patient currently is medically ready to d/c.   Subjective and Interval History:  Sleeping during the day, however, did work with PT and OT today.   Objective: Vitals:   08/18/21 0744 08/18/21 1558 08/18/21 1936 08/18/21 2352  BP: (!) 108/51 (!) 114/55 107/62 (!) 101/51  Pulse: 82 84 75 83  Resp: 16 16 18 17   Temp: 98 F (36.7 C) 98.7 F (37.1 C) 97.7 F (36.5 C) 98.3 F (36.8 C)  TempSrc:  Oral    SpO2: 99% 97% 98% 98%  Weight:      Height:        Intake/Output Summary (Last 24 hours) at 08/18/2021 2358 Last data filed at 08/18/2021 1940 Gross per 24 hour  Intake --  Output 1250 ml  Net -1250 ml   Filed Weights   08/06/21 0500 08/07/21 0500 08/12/21 0653  Weight: 60.5 kg 60.1 kg 64.5 kg  Examination:   Constitutional: NAD, sleeping CV: No cyanosis.   RESP: normal respiratory effort, on RA Neuro: II - XII grossly intact.     Data Reviewed: I have personally reviewed following labs and imaging studies  CBC: Recent Labs  Lab 08/18/21 0342  WBC 6.1  HGB 8.0*  HCT 26.6*  MCV 77.8*  PLT 809   Basic Metabolic Panel: Recent Labs  Lab 08/18/21 0342  NA 133*  K 4.8  CL 104  CO2 21*  GLUCOSE 109*  BUN 16  CREATININE 0.81  CALCIUM 8.6*    GFR: Estimated Creatinine Clearance: 89.6 mL/min (by C-G formula based  on SCr of 0.81 mg/dL). Liver Function Tests: No results for input(s): AST, ALT, ALKPHOS, BILITOT, PROT, ALBUMIN in the last 168 hours.  No results for input(s): LIPASE, AMYLASE in the last 168 hours. No results for input(s): AMMONIA in the last 168 hours. Coagulation Profile: No results for input(s): INR, PROTIME in the last 168 hours. Cardiac Enzymes: No results for input(s): CKTOTAL, CKMB, CKMBINDEX, TROPONINI in the last 168 hours. BNP (last 3 results) No results for input(s): PROBNP in the last 8760 hours. HbA1C: No results for input(s): HGBA1C in the last 72 hours. CBG: No results for input(s): GLUCAP in the last 168 hours. Lipid Profile: No results for input(s): CHOL, HDL, LDLCALC, TRIG, CHOLHDL, LDLDIRECT in the last 72 hours. Thyroid Function Tests: No results for input(s): TSH, T4TOTAL, FREET4, T3FREE, THYROIDAB in the last 72 hours. Anemia Panel: No results for input(s): VITAMINB12, FOLATE, FERRITIN, TIBC, IRON, RETICCTPCT in the last 72 hours. Sepsis Labs: No results for input(s): PROCALCITON, LATICACIDVEN in the last 168 hours.  No results found for this or any previous visit (from the past 240 hour(s)).    Radiology Studies: No results found.   Scheduled Meds:  calcium carbonate  1,250 mg Oral TID WC   enzalutamide  160 mg Oral Daily   feeding supplement  237 mL Oral TID BM   magnesium oxide  400 mg Oral Daily   midodrine  5 mg Oral TID WC   mirtazapine  15 mg Oral QHS   multivitamin with minerals  1 tablet Oral Daily   QUEtiapine  100 mg Oral QHS   rivaroxaban  10 mg Oral Q supper   senna-docusate  1 tablet Oral BID   Continuous Infusions:  sodium chloride Stopped (06/04/21 1216)     LOS: 76 days     Enzo Bi, MD [facility] If 7PM-7AM, please contact night-coverage 08/18/2021, 11:58 PM

## 2021-08-18 NOTE — Progress Notes (Addendum)
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  Pt recently here for multiple months with difficult d/c planning, did ultimatley d/c to homeless shelter and was gone only a few hours before fall with L ankle and wrist fractures with return to the ED.   OT comments  Pt seen for OT tx this date. OT engaes pt in seated UB g/h tasks and bathing/dressing with SETUP. Pt requires MIN A for bridging in bed for LB bathing and MIN A for standing clothing mgt over hips with platform RW and CGA to actually achieve standing. Pt left sitting EOB to start PT session.   Recommendations for follow up therapy are one component of a multi-disciplinary discharge planning process, led by the attending physician.  Recommendations may be updated based on patient status, additional functional criteria and insurance authorization.    Follow Up Recommendations  Home health OT    Assistance Recommended at Discharge Frequent or constant Supervision/Assistance  Equipment Recommendations  Tub/shower seat    Recommendations for Other Services      Precautions / Restrictions Precautions Precautions: Fall Precaution Comments: WBAT Restrictions Weight Bearing Restrictions: Yes LUE Weight Bearing: Weight bearing as tolerated LLE Weight Bearing: Weight bearing as tolerated Other Position/Activity Restrictions: splints removed 10/5, pt WBAT to L LE well, still prefers use of platform for L UE on RW       Mobility Bed Mobility Overal bed mobility: Modified Independent Bed Mobility: Supine to Sit     Supine to sit: Modified independent (Device/Increase time)           Transfers Overall transfer level: Needs assistance Equipment used: Rolling walker (2 wheels) Transfers: Sit to/from Stand Sit to Stand: Min guard           General transfer comment: improving, but rock and count technique to CTS still to get momentum, steadying     Balance Overall balance assessment: Needs assistance Sitting-balance support: Feet supported Sitting balance-Leahy Scale: Good     Standing balance support: Bilateral upper extremity supported Standing balance-Leahy Scale: Fair                             ADL either performed or assessed with clinical judgement   ADL Overall ADL's : Needs assistance/impaired     Grooming: Wash/dry hands;Wash/dry face;Set up;Sitting   Upper Body Bathing: Set up;Sitting   Lower Body Bathing: Minimal assistance;Bed level Lower Body Bathing Details (indicate cue type and reason): bridging in bed Upper Body Dressing : Set up;Sitting   Lower Body Dressing: Minimal assistance;Sit to/from stand Lower Body Dressing Details (indicate cue type and reason): STS with platform RW for clothing mgt over hips                    Extremity/Trunk Assessment              Vision       Perception     Praxis      Cognition Arousal/Alertness: Awake/alert Behavior During Therapy: WFL for tasks assessed/performed Overall Cognitive Status: Within Functional Limits for tasks assessed  General Comments: A&O, somewhat less conversive but overall able to follow commands and is participatory          Exercises Other Exercises Other Exercises: OT engaes pt in seated and standing bathing, dressing and grooming tasks.   Shoulder Instructions       General Comments      Pertinent Vitals/ Pain       Pain Assessment: No/denies pain  Home Living                                          Prior Functioning/Environment              Frequency   Min 1X/week        Progress Toward Goals  OT Goals(current goals can now be found in the care plan section)  Progress towards OT goals: Progressing toward goals  Acute Rehab OT Goals OT Goal Formulation: With patient Time For Goal Achievement: 09/06/21 Potential to Achieve Goals: Good  Plan Frequency remains appropriate;Discharge plan needs to be updated    Co-evaluation                 AM-PAC OT "6 Clicks" Daily Activity     Outcome Measure   Help from another person eating meals?: None Help from another person taking care of personal grooming?: A Little Help from another person toileting, which includes using toliet, bedpan, or urinal?: A Little Help from another person bathing (including washing, rinsing, drying)?: A Little Help from another person to put on and taking off regular upper body clothing?: A Little Help from another person to put on and taking off regular lower body clothing?: A Little 6 Click Score: 19    End of Session Equipment Utilized During Treatment: Rolling walker (2 wheels)  OT Visit Diagnosis: Unsteadiness on feet (R26.81);Muscle weakness (generalized) (M62.81);History of falling (Z91.81);Adult, failure to thrive (R62.7) Pain - Right/Left: Left Pain - part of body: Arm;Hand   Activity Tolerance Patient tolerated treatment well   Patient Left in bed;with call bell/phone within reach;with bed alarm set   Nurse Communication Mobility status        Time: 7824-2353 OT Time Calculation (min): 12 min  Charges: OT General Charges $OT Visit: 1 Visit OT Treatments $Self Care/Home Management : 8-22 mins  Gerrianne Scale, [place], OTR/L ascom [phone] 08/18/21, 7:03 PM

## 2021-08-18 NOTE — TOC Progression Note (Signed)
Transition of Care [facility]) - Progression Note    Patient Details  Name: Ryan [name] MRN: [id number] Date of Birth: 01-23-1961  Transition of Care [facility]) CM/SW [place], RN Phone Number: 08/18/2021, 1:38 PM  Clinical Narrative:    Received notification from [facility] that she has been sick and unable to come to do face to face at this time, Hopefully will be able to come soon        Expected Discharge Plan and Services                                                 Social Determinants of Health (SDOH) Interventions    Readmission Risk Interventions Readmission Risk Prevention Plan 04/23/2021  Transportation Screening Complete  PCP or Specialist Appt within 3-5 Days Complete  HRI or [place] Complete  Social Work Consult for [place] Planning/Counseling Not Complete  SW consult not completed comments RNCM assigned to patient  Palliative Care Screening Complete  Medication Review [employer]) Complete  Some recent data might be hidden

## 2021-08-19 NOTE — Progress Notes (Signed)
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  Pt recently here for multiple months with difficult d/c planning, did ultimatley d/c to homeless shelter and was gone only a few hours before fall with L ankle and wrist fractures with return to the ED.    PT Comments    Pt received supine in bed. Agreeable to session. Remains mod-I to sit EOB. Educated on YTB resisted ankle therex for strengthening due to greater weakness from fracture on L ankle. Performed x10 in all planes bilaterally. Most difficulty with form/technique noted with Ever/inv as pt attempts to compensate with hip IR/ER requiring Pt assist to keep hip in neutral alignment. Not needed on RLE indicating L ankle weakness > R ankle. Tolerated lap around nurses station still requiring cuing for maintaining upright posture with supervision. Pt does endorse L ankle fatigue with amb after ankle therex. Returned to sitting Eob with breakfast set up. Bed alarm engaged. D/c recs remain appropriate.   Recommendations for follow up therapy are one component of a multi-disciplinary discharge planning process, led by the attending physician.  Recommendations may be updated based on patient status, additional functional criteria and insurance authorization.  Follow Up Recommendations  Home health PT     Assistance Recommended at Discharge Intermittent Supervision/Assistance  Equipment Recommendations  None recommended by PT    Recommendations for Other Services       Precautions / Restrictions Precautions Precautions: Fall Precaution Comments: WBAT Restrictions Weight Bearing Restrictions: Yes LUE Weight Bearing:  Weight bearing as tolerated LLE Weight Bearing: Weight bearing as tolerated     Mobility  Bed Mobility Overal bed mobility: Modified Independent Bed Mobility: Supine to Sit Rolling: Supervision           Patient Response: Cooperative  Transfers Overall transfer level: Needs assistance Equipment used: Rolling walker (2 wheels) Transfers: Sit to/from Stand Sit to Stand: Min guard                Ambulation/Gait Ambulation/Gait assistance: Supervision Gait Distance (Feet): 170 Feet Assistive device: Rolling walker (2 wheels);Left platform walker Gait Pattern/deviations: Trunk flexed;Decreased stride length       General Gait Details: Cuing to promote upright posture for reduced reliance on UE's to help LE strength   Stairs             Wheelchair Mobility    Modified Rankin (Stroke Patients Only)       Balance Overall balance assessment: Needs assistance Sitting-balance support: Feet supported Sitting balance-Leahy Scale: Good     Standing balance support: Bilateral upper extremity supported Standing balance-Leahy Scale: Fair Standing balance comment: platform L side                            Cognition Arousal/Alertness: Awake/alert Behavior During Therapy: WFL for tasks assessed/performed Overall Cognitive Status: Within Functional Limits for tasks assessed                                          Exercises Other Exercises Other Exercises: YTB resisted foot DF/PF/INV/EVER x10 bilat  General Comments General comments (skin integrity, edema, etc.): Educated on importance of L ankle eversion/inversion strengthening due to weakness from fracture      Pertinent Vitals/Pain Pain Assessment: No/denies pain    Home Living                          Prior Function            PT Goals (current goals can now be found in the care plan section) Acute Rehab PT Goals Patient Stated Goal: to get  stronger PT Goal Formulation: With patient Time For Goal Achievement: 08/26/21 Potential to Achieve Goals: Fair Progress towards PT goals: Progressing toward goals    Frequency    Min 2X/week      PT Plan Current plan remains appropriate    Co-evaluation              AM-PAC PT "6 Clicks" Mobility   Outcome Measure  Help needed turning from your back to your side while in a flat bed without using bedrails?: None Help needed moving from lying on your back to sitting on the side of a flat bed without using bedrails?: None Help needed moving to and from a bed to a chair (including a wheelchair)?: A Little Help needed standing up from a chair using your arms (e.g., wheelchair or bedside chair)?: A Little Help needed to walk in hospital room?: A Little Help needed climbing 3-5 steps with a railing? : A Lot 6 Click Score: 19    End of Session Equipment Utilized During Treatment: Gait belt Activity Tolerance: Patient tolerated treatment well Patient left: in bed;with call bell/phone within reach;with bed alarm set Nurse Communication: Mobility status PT Visit Diagnosis: Unsteadiness on feet (R26.81);Muscle weakness (generalized) (M62.81);Difficulty in walking, not elsewhere classified (R26.2);History of falling (Z91.81)     Time: 2297-9892 PT Time Calculation (min) (ACUTE ONLY): 17 min  Charges:  $Therapeutic Exercise: 8-22 mins                    [name]. Fairly IV, PT, DPT Physical Therapist- [facility]  08/19/2021, 12:38 PM

## 2021-08-19 NOTE — Progress Notes (Signed)
PROGRESS NOTE    Ryan [name]  VQM:[id number] DOB: 1961/01/17 DOA: 06/02/2021 PCP: Pcp, No  150A/150A-AA   Assessment & Plan:   Principal Problem:   Closed left ankle fracture Active Problems:   Fall   Anemia of chronic disease   Protein-calorie malnutrition, severe (HCC)   Cancer cachexia (HCC)   Prostate cancer metastatic to bone (HCC)   Left wrist fracture, closed, initial encounter   Hypocalcemia   Hyperkalemia   Closed fracture of left distal radius   Closed bimalleolar fracture of left ankle   60 year old man PMH metastatic prostate cancer presented with a fall resulting in closed left ankle fracture and left wrist fracture.  This was treated with conservative management.  Hospitalization uncomplicated.  Discharge delayed by difficulty with disposition.  Remains medically stable for discharge.  Accepting facility has some concern about the start date of his disability.   Patient received dose of Zometa on 07/30/2021.  Oncology repeated the PSA which seems rising at 1199 from 184. Patient wants to talk with his oncologist, talk with [name], he will try to see him soon. 10/25: Dr. Marc Morgans gone reordered a repeat PSA to confirm, we will see him after that resulted.     Closed left ankle and wrist fracture.  Secondary to fall.  Managed conservatively by orthopedic surgery.  Splint has been removed.  Able to work with PT and weightbearing.   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   Being followed up by oncology as an outpatient.  Patient received his Zometa today, next dose will be on November 18. Getting Lupron every 54-month, prior dose was approximately a month ago. Repeat PSA done yesterday by oncology with significant rise to 1199 from 184 a month ago. --started on Xtandi on 08/05/21. Plan: --cont p.o. Xtandi 160 mg daily indefinitely, per oncology --Continue monthly Zometa with his next dose due on approximately August 27, 2021. --Lupron q5months.     Cancer cachexia/protein caloric malnutrition, severe. -Encourage p.o. intake with supplements.   Anemia of chronic disease. -Stable  Insomnia --Pt often complains of not being able to sleep at night.  Pt is noted to be sleeping a lot during the day.  Nightly trazodone had stopped working.  Switched to seroquel nightly. --cont seroquel 100 mg nightly for now. --trazodone 150 mg nightly PRN   DVT prophylaxis: DT:OIZTIWP  Code Status: DNR  Family Communication:  Level of care: Med-Surg Dispo:   The patient is from: has been in the hospital for the past year Anticipated d/c is to: SNF Anticipated d/c date is: whenever bed available  Patient currently is medically ready to d/c.   Subjective and Interval History:  Reported intermittent sharp pain in his wrist and ankle.  Again feeling tired after chemo, however, has been participating with PT/OT.   Objective: Vitals:   08/19/21 0317 08/19/21 0802 08/19/21 1113 08/19/21 1607  BP: (!) 111/49 (!) 98/55 101/60 (!) 117/56  Pulse: 85 91 78 77  Resp: 17 15 14 14   Temp: 98.1 F (36.7 C) 98.5 F (36.9 C) 97.9 F (36.6 C) 98.6 F (37 C)  TempSrc:      SpO2: 96% 97% 99% 99%  Weight:      Height:        Intake/Output Summary (Last 24 hours) at 08/19/2021 1615 Last data filed at 08/19/2021 1147 Gross per 24 hour  Intake --  Output 1750 ml  Net -1750 ml   Filed Weights   08/06/21 0500 08/07/21 0500 08/12/21  4287  Weight: 60.5 kg 60.1 kg 64.5 kg    Examination:   Constitutional: NAD, sleeping but woke to answer questions HEENT: conjunctivae and lids normal, EOMI CV: No cyanosis.   RESP: normal respiratory effort, on RA Extremities: No effusions, edema in BLE SKIN: warm, dry   Data Reviewed: I have personally reviewed following labs and imaging studies  CBC: Recent Labs  Lab 08/18/21 0342  WBC 6.1  HGB 8.0*  HCT 26.6*  MCV 77.8*  PLT 681   Basic Metabolic Panel: Recent Labs  Lab 08/18/21 0342  NA 133*   K 4.8  CL 104  CO2 21*  GLUCOSE 109*  BUN 16  CREATININE 0.81  CALCIUM 8.6*    GFR: Estimated Creatinine Clearance: 89.6 mL/min (by C-G formula based on SCr of 0.81 mg/dL). Liver Function Tests: No results for input(s): AST, ALT, ALKPHOS, BILITOT, PROT, ALBUMIN in the last 168 hours.  No results for input(s): LIPASE, AMYLASE in the last 168 hours. No results for input(s): AMMONIA in the last 168 hours. Coagulation Profile: No results for input(s): INR, PROTIME in the last 168 hours. Cardiac Enzymes: No results for input(s): CKTOTAL, CKMB, CKMBINDEX, TROPONINI in the last 168 hours. BNP (last 3 results) No results for input(s): PROBNP in the last 8760 hours. HbA1C: No results for input(s): HGBA1C in the last 72 hours. CBG: No results for input(s): GLUCAP in the last 168 hours. Lipid Profile: No results for input(s): CHOL, HDL, LDLCALC, TRIG, CHOLHDL, LDLDIRECT in the last 72 hours. Thyroid Function Tests: No results for input(s): TSH, T4TOTAL, FREET4, T3FREE, THYROIDAB in the last 72 hours. Anemia Panel: No results for input(s): VITAMINB12, FOLATE, FERRITIN, TIBC, IRON, RETICCTPCT in the last 72 hours. Sepsis Labs: No results for input(s): PROCALCITON, LATICACIDVEN in the last 168 hours.  No results found for this or any previous visit (from the past 240 hour(s)).    Radiology Studies: No results found.   Scheduled Meds:  calcium carbonate  1,250 mg Oral TID WC   enzalutamide  160 mg Oral Daily   feeding supplement  237 mL Oral TID BM   magnesium oxide  400 mg Oral Daily   midodrine  5 mg Oral TID WC   mirtazapine  15 mg Oral QHS   multivitamin with minerals  1 tablet Oral Daily   QUEtiapine  100 mg Oral QHS   rivaroxaban  10 mg Oral Q supper   senna-docusate  1 tablet Oral BID   Continuous Infusions:  sodium chloride Stopped (06/04/21 1216)     LOS: 77 days     Enzo Bi, MD [facility] If 7PM-7AM, please contact night-coverage 08/19/2021, 4:15  PM

## 2021-08-20 NOTE — Plan of Care (Signed)
No acute events during the night. NAD noted. VSS.  Problem: Education: Goal: Knowledge of General Education information will improve Description: Including pain rating scale, medication(s)/side effects and non-pharmacologic comfort measures Outcome: Progressing   Problem: Health Behavior/Discharge Planning: Goal: Ability to manage health-related needs will improve Outcome: Progressing   Problem: Clinical Measurements: Goal: Ability to maintain clinical measurements within normal limits will improve Outcome: Progressing Goal: Will remain free from infection Outcome: Progressing Goal: Diagnostic test results will improve Outcome: Progressing Goal: Respiratory complications will improve Outcome: Progressing Goal: Cardiovascular complication will be avoided Outcome: Progressing   Problem: Activity: Goal: Risk for activity intolerance will decrease Outcome: Progressing   Problem: Nutrition: Goal: Adequate nutrition will be maintained Outcome: Progressing   Problem: Coping: Goal: Level of anxiety will decrease Outcome: Progressing   Problem: Elimination: Goal: Will not experience complications related to bowel motility Outcome: Progressing Goal: Will not experience complications related to urinary retention Outcome: Progressing   Problem: Pain Managment: Goal: General experience of comfort will improve Outcome: Progressing   Problem: Safety: Goal: Ability to remain free from injury will improve Outcome: Progressing   Problem: Skin Integrity: Goal: Risk for impaired skin integrity will decrease Outcome: Progressing   Problem: Education: Goal: Knowledge of the prescribed therapeutic regimen will improve Outcome: Progressing   Problem: Activity: Goal: Ability to increase mobility will improve Outcome: Progressing   Problem: Pain Management: Goal: Pain level will decrease with appropriate interventions Outcome: Progressing

## 2021-08-20 NOTE — Progress Notes (Signed)
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  Pt recently here for multiple months with difficult d/c planning, did ultimatley d/c to homeless shelter and was gone only a few hours before fall with L ankle and wrist fractures with return to the ED.   OT comments  Pt seen for OT tx this date. OT engages pt in seated LB ADLs with SETUP and standing with MIN A for clothing mgt over hips. OT engages pt in seated L wrist and L ankle ROM and stretching in prep for mobilization. Pt able to CTS with RW with CGA and demos G balance, but still requires cues to reach back and control descent when coming to sitting. OT engages pt in fxl mobility around nursing unit with CGA with platform RW (Left) with minimal cues for hazard avoidance. Pt returned to room and left in bed with all needs met and in reach. Will continue to follow.   Recommendations for follow up therapy are one component of a multi-disciplinary discharge planning process, led by the attending physician.  Recommendations may be updated based on patient status, additional functional criteria and insurance authorization.    Follow Up Recommendations  Home health OT    Assistance Recommended at Discharge Frequent or constant Supervision/Assistance  Equipment Recommendations  Tub/shower seat    Recommendations for Other Services      Precautions / Restrictions Precautions Precautions: Fall Precaution Comments: WBAT Restrictions Weight Bearing Restrictions: Yes LUE Weight Bearing: Weight bearing as tolerated LLE Weight Bearing: Weight bearing as tolerated Other Position/Activity Restrictions: splints removed  10/5, pt WBAT to L LE well, still prefers use of platform for L UE on RW       Mobility Bed Mobility Overal bed mobility: Modified Independent                  Transfers Overall transfer level: Needs assistance Equipment used: Rolling walker (2 wheels) Transfers: Sit to/from Stand Sit to Stand: Min guard           General transfer comment: rock and count to gain momentum, still prefers to use platform on L side, OT sets goal for pt to stop using on 11/20     Balance Overall balance assessment: Needs assistance Sitting-balance support: Feet supported Sitting balance-Leahy Scale: Good       Standing balance-Leahy Scale: Fair Standing balance comment: platform L side                           ADL either performed or assessed with clinical judgement   ADL Overall ADL's : Needs assistance/impaired                     Lower Body Dressing: Minimal assistance;Sit to/from stand Lower Body Dressing Details (indicate cue type and reason): SETUP for seated donning of slip on shoes, MINA  for balance with standing clothing mgt over hips.             Functional mobility during ADLs: Min guard;Rolling walker (2 wheels) (one lap around nurses station)      Extremity/Trunk Assessment  Vision       Perception     Praxis      Cognition Arousal/Alertness: Awake/alert Behavior During Therapy: WFL for tasks assessed/performed Overall Cognitive Status: Within Functional Limits for tasks assessed                                            Exercises Other Exercises Other Exercises: OT engages pt in seated LB ADLs and transfers and fxl mobility wiht platform walker   Shoulder Instructions       General Comments      Pertinent Vitals/ Pain       Pain Assessment: 0-10 Pain Score: 5  Pain Location: L wrist, fingers Pain Descriptors / Indicators: Aching Pain Intervention(s): Limited activity within patient's  tolerance;Other (comment) (stretch/ROM)  Home Living                                          Prior Functioning/Environment              Frequency  Min 1X/week        Progress Toward Goals  OT Goals(current goals can now be found in the care plan section)  Progress towards OT goals: Progressing toward goals  Acute Rehab OT Goals OT Goal Formulation: With patient Time For Goal Achievement: 09/06/21 Potential to Achieve Goals: Good  Plan Frequency remains appropriate;Discharge plan needs to be updated    Co-evaluation                 AM-PAC OT "6 Clicks" Daily Activity     Outcome Measure   Help from another person eating meals?: None Help from another person taking care of personal grooming?: A Little Help from another person toileting, which includes using toliet, bedpan, or urinal?: A Little Help from another person bathing (including washing, rinsing, drying)?: A Little Help from another person to put on and taking off regular upper body clothing?: A Little Help from another person to put on and taking off regular lower body clothing?: A Little 6 Click Score: 19    End of Session Equipment Utilized During Treatment: Rolling walker (2 wheels) (platform)  OT Visit Diagnosis: Unsteadiness on feet (R26.81);Muscle weakness (generalized) (M62.81);History of falling (Z91.81);Adult, failure to thrive (R62.7) Pain - Right/Left: Left Pain - part of body: Arm;Hand   Activity Tolerance Patient tolerated treatment well   Patient Left in bed;with call bell/phone within reach   Nurse Communication Mobility status        Time: 9741-6384 OT Time Calculation (min): 41 min  Charges: OT General Charges $OT Visit: 1 Visit OT Treatments $Self Care/Home Management : 23-37 mins $Therapeutic Activity: 8-22 mins  Gerrianne Scale, MS, OTR/L ascom [phone] 08/20/21, 4:16 PM

## 2021-08-20 NOTE — Progress Notes (Addendum)
PROGRESS NOTE    Ryan [name]  QIW:[id number] DOB: Jun 28, 1961 DOA: 06/02/2021 PCP: Pcp, No  150A/150A-AA   Assessment & Plan:   Principal Problem:   Closed left ankle fracture Active Problems:   Fall   Anemia of chronic disease   Protein-calorie malnutrition, severe (HCC)   Cancer cachexia (HCC)   Prostate cancer metastatic to bone (HCC)   Left wrist fracture, closed, initial encounter   Hypocalcemia   Hyperkalemia   Closed fracture of left distal radius   Closed bimalleolar fracture of left ankle   60 year old man PMH metastatic prostate cancer presented with a fall resulting in closed left ankle fracture and left wrist fracture.  This was treated with conservative management.  Hospitalization uncomplicated.  Discharge delayed by difficulty with disposition.  Remains medically stable for discharge.  Accepting facility has some concern about the start date of his disability.   Patient received dose of Zometa on 07/30/2021.  Oncology repeated the PSA which seems rising at 1199 from 184. Patient wants to talk with his oncologist, talk with [name], he will try to see him soon. 10/25: Dr. Marc Morgans gone reordered a repeat PSA to confirm, we will see him after that resulted.     Closed left ankle and wrist fracture.  Secondary to fall.  Managed conservatively by orthopedic surgery.  Splint has been removed.  Able to work with PT and weightbearing.   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   Being followed up by oncology as an outpatient.  Patient received his Zometa today, next dose will be on November 18. Getting Lupron every 14-month, prior dose was approximately a month ago. Repeat PSA done yesterday by oncology with significant rise to 1199 from 184 a month ago. --started on Xtandi on 08/05/21. Plan: --cont p.o. Xtandi 160 mg daily indefinitely, per oncology --Continue monthly Zometa with his next dose due on approximately August 27, 2021. --Lupron q69months.     Cancer cachexia/protein caloric malnutrition, severe. -Encourage p.o. intake with supplements.   Anemia of chronic disease. -Stable  Insomnia --Pt often complains of not being able to sleep at night.  Pt is noted to be sleeping a lot during the day.  Nightly trazodone had stopped working.  Switched to seroquel nightly. --cont seroquel 100 mg nightly for now. --trazodone 150 mg nightly PRN   DVT prophylaxis: [id number]  Code Status: DNR  Family Communication:  Level of care: Med-Surg Dispo:   The patient is from: has been in the hospital for the past year Anticipated d/c is to: SNF Anticipated d/c date is: whenever bed available  Patient currently is medically ready to d/c.   Subjective and Interval History:  Pt was seen working with OT.     Objective: Vitals:   08/20/21 0508 08/20/21 0746 08/20/21 1204 08/20/21 1536  BP: (!) 112/56 (!) 104/57 110/66 113/65  Pulse: 88 87 81 86  Resp: 18 17 17 17   Temp: 97.9 F (36.6 C) 98.3 F (36.8 C) (!) 97.5 F (36.4 C) 98.2 F (36.8 C)  TempSrc:  Oral Oral Oral  SpO2: 100% 100% 98% 99%  Weight:      Height:       No intake or output data in the 24 hours ending 08/20/21 1735  Filed Weights   08/06/21 0500 08/07/21 0500 08/12/21 0653  Weight: 60.5 kg 60.1 kg 64.5 kg    Examination:   Constitutional: NAD, AAOx3 HEENT: conjunctivae and lids normal, EOMI CV: No cyanosis.   RESP: normal respiratory  effort, on RA Extremities: No effusions, edema in BLE SKIN: warm, dry Neuro: II - XII grossly intact.   Psych: Normal mood and affect.  Appropriate judgement and reason   Data Reviewed: I have personally reviewed following labs and imaging studies  CBC: Recent Labs  Lab 08/18/21 0342  WBC 6.1  HGB 8.0*  HCT 26.6*  MCV 77.8*  PLT 017   Basic Metabolic Panel: Recent Labs  Lab 08/18/21 0342  NA 133*  K 4.8  CL 104  CO2 21*  GLUCOSE 109*  BUN 16  CREATININE 0.81  CALCIUM 8.6*    GFR: Estimated  Creatinine Clearance: 89.6 mL/min (by C-G formula based on SCr of 0.81 mg/dL). Liver Function Tests: No results for input(s): AST, ALT, ALKPHOS, BILITOT, PROT, ALBUMIN in the last 168 hours.  No results for input(s): LIPASE, AMYLASE in the last 168 hours. No results for input(s): AMMONIA in the last 168 hours. Coagulation Profile: No results for input(s): INR, PROTIME in the last 168 hours. Cardiac Enzymes: No results for input(s): CKTOTAL, CKMB, CKMBINDEX, TROPONINI in the last 168 hours. BNP (last 3 results) No results for input(s): PROBNP in the last 8760 hours. HbA1C: No results for input(s): HGBA1C in the last 72 hours. CBG: No results for input(s): GLUCAP in the last 168 hours. Lipid Profile: No results for input(s): CHOL, HDL, LDLCALC, TRIG, CHOLHDL, LDLDIRECT in the last 72 hours. Thyroid Function Tests: No results for input(s): TSH, T4TOTAL, FREET4, T3FREE, THYROIDAB in the last 72 hours. Anemia Panel: No results for input(s): VITAMINB12, FOLATE, FERRITIN, TIBC, IRON, RETICCTPCT in the last 72 hours. Sepsis Labs: No results for input(s): PROCALCITON, LATICACIDVEN in the last 168 hours.  No results found for this or any previous visit (from the past 240 hour(s)).    Radiology Studies: No results found.   Scheduled Meds:  calcium carbonate  1,250 mg Oral TID WC   enzalutamide  160 mg Oral Daily   feeding supplement  237 mL Oral TID BM   magnesium oxide  400 mg Oral Daily   midodrine  5 mg Oral TID WC   mirtazapine  15 mg Oral QHS   multivitamin with minerals  1 tablet Oral Daily   QUEtiapine  100 mg Oral QHS   rivaroxaban  10 mg Oral Q supper   senna-docusate  1 tablet Oral BID   Continuous Infusions:  sodium chloride Stopped (06/04/21 1216)     LOS: 78 days     Enzo Bi, MD [facility] If 7PM-7AM, please contact night-coverage 08/20/2021, 5:35 PM

## 2021-08-21 NOTE — Progress Notes (Signed)
PROGRESS NOTE    Ryan [name]  UDJ:[id number] DOB: 01/16/1961 DOA: 06/02/2021 PCP: Pcp, No  150A/150A-AA   Assessment & Plan:   Principal Problem:   Closed left ankle fracture Active Problems:   Fall   Anemia of chronic disease   Protein-calorie malnutrition, severe (HCC)   Cancer cachexia (HCC)   Prostate cancer metastatic to bone (HCC)   Left wrist fracture, closed, initial encounter   Hypocalcemia   Hyperkalemia   Closed fracture of left distal radius   Closed bimalleolar fracture of left ankle   60 year old man PMH metastatic prostate cancer presented with a fall resulting in closed left ankle fracture and left wrist fracture.  This was treated with conservative management.  Hospitalization uncomplicated.  Discharge delayed by difficulty with disposition.  Remains medically stable for discharge.  Accepting facility has some concern about the start date of his disability.   Patient received dose of Zometa on 07/30/2021.  Oncology repeated the PSA which seems rising at 1199 from 184. Patient wants to talk with his oncologist, talk with [name], he will try to see him soon. 10/25: Dr. Marc Morgans gone reordered a repeat PSA to confirm, we will see him after that resulted.     Closed left ankle and wrist fracture.  Secondary to fall.  Managed conservatively by orthopedic surgery.  Splint has been removed.  Able to work with PT and weightbearing.   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   Being followed up by oncology as an outpatient.  Patient received his Zometa today, next dose will be on November 18. Getting Lupron every 78-month, prior dose was approximately a month ago. Repeat PSA done yesterday by oncology with significant rise to 1199 from 184 a month ago. --started on Xtandi on 08/05/21. Plan: --cont p.o. Xtandi 160 mg daily indefinitely, per oncology --Continue monthly Zometa with his next dose due on approximately August 27, 2021. --Lupron q35months.     Cancer cachexia/protein caloric malnutrition, severe. -Encourage p.o. intake with supplements.   Anemia of chronic disease. -Stable  Insomnia --Pt often complains of not being able to sleep at night.  Pt is noted to be sleeping a lot during the day.  Nightly trazodone had stopped working.  Switched to seroquel nightly. --cont seroquel 100 mg nightly for now. --trazodone 150 mg nightly PRN   DVT prophylaxis: HY:[id number]  Code Status: DNR  Family Communication:  Level of care: Med-Surg Dispo:   The patient is from: has been in the hospital for the past year Anticipated d/c is to: SNF Anticipated d/c date is: whenever bed available  Patient currently is medically ready to d/c.   Subjective and Interval History:  Pt was found napping.  Pt reported feeling tired after chemo med given, but told me he makes it a routine to get up and walk.   Objective: Vitals:   08/21/21 0450 08/21/21 0813 08/21/21 1207 08/21/21 1539  BP: (!) 100/53 (!) 100/57 (!) 110/53 (!) 112/52  Pulse: 87 88 83 78  Resp: 18 14 15 15   Temp: 98.3 F (36.8 C) 98.4 F (36.9 C) 98 F (36.7 C) 98.3 F (36.8 C)  TempSrc: Oral     SpO2: 98% 98% 96% 100%  Weight:      Height:        Intake/Output Summary (Last 24 hours) at 08/21/2021 1652 Last data filed at 08/21/2021 1009 Gross per 24 hour  Intake --  Output 1250 ml  Net -1250 ml    [employer]  08/06/21 0500 08/07/21 0500 08/12/21 0653  Weight: 60.5 kg 60.1 kg 64.5 kg    Examination:   Constitutional: NAD, AAOx3 HEENT: conjunctivae and lids normal, EOMI CV: No cyanosis.   RESP: normal respiratory effort, on RA Extremities: No effusions, edema in BLE Neuro: II - XII grossly intact.     Data Reviewed: I have personally reviewed following labs and imaging studies  CBC: Recent Labs  Lab 08/18/21 0342  WBC 6.1  HGB 8.0*  HCT 26.6*  MCV 77.8*  PLT 720   Basic Metabolic Panel: Recent Labs  Lab 08/18/21 0342  NA 133*  K 4.8   CL 104  CO2 21*  GLUCOSE 109*  BUN 16  CREATININE 0.81  CALCIUM 8.6*    GFR: Estimated Creatinine Clearance: 89.6 mL/min (by C-G formula based on SCr of 0.81 mg/dL). Liver Function Tests: No results for input(s): AST, ALT, ALKPHOS, BILITOT, PROT, ALBUMIN in the last 168 hours.  No results for input(s): LIPASE, AMYLASE in the last 168 hours. No results for input(s): AMMONIA in the last 168 hours. Coagulation Profile: No results for input(s): INR, PROTIME in the last 168 hours. Cardiac Enzymes: No results for input(s): CKTOTAL, CKMB, CKMBINDEX, TROPONINI in the last 168 hours. BNP (last 3 results) No results for input(s): PROBNP in the last 8760 hours. HbA1C: No results for input(s): HGBA1C in the last 72 hours. CBG: No results for input(s): GLUCAP in the last 168 hours. Lipid Profile: No results for input(s): CHOL, HDL, LDLCALC, TRIG, CHOLHDL, LDLDIRECT in the last 72 hours. Thyroid Function Tests: No results for input(s): TSH, T4TOTAL, FREET4, T3FREE, THYROIDAB in the last 72 hours. Anemia Panel: No results for input(s): VITAMINB12, FOLATE, FERRITIN, TIBC, IRON, RETICCTPCT in the last 72 hours. Sepsis Labs: No results for input(s): PROCALCITON, LATICACIDVEN in the last 168 hours.  No results found for this or any previous visit (from the past 240 hour(s)).    Radiology Studies: No results found.   Scheduled Meds:  calcium carbonate  1,250 mg Oral TID WC   enzalutamide  160 mg Oral Daily   feeding supplement  237 mL Oral TID BM   magnesium oxide  400 mg Oral Daily   midodrine  5 mg Oral TID WC   mirtazapine  15 mg Oral QHS   multivitamin with minerals  1 tablet Oral Daily   QUEtiapine  100 mg Oral QHS   rivaroxaban  10 mg Oral Q supper   senna-docusate  1 tablet Oral BID   Continuous Infusions:  sodium chloride Stopped (06/04/21 1216)     LOS: 79 days     Enzo Bi, MD [facility] If 7PM-7AM, please contact night-coverage 08/21/2021, 4:52 PM

## 2021-08-22 NOTE — Progress Notes (Signed)
PROGRESS NOTE    Ryan [name]  UEK:[id number] DOB: September 01, 1961 DOA: 06/02/2021 PCP: Pcp, No  150A/150A-AA   Assessment & Plan:   Principal Problem:   Closed left ankle fracture Active Problems:   Fall   Anemia of chronic disease   Protein-calorie malnutrition, severe (HCC)   Cancer cachexia (HCC)   Prostate cancer metastatic to bone (HCC)   Left wrist fracture, closed, initial encounter   Hypocalcemia   Hyperkalemia   Closed fracture of left distal radius   Closed bimalleolar fracture of left ankle   60 year old man PMH metastatic prostate cancer presented with a fall resulting in closed left ankle fracture and left wrist fracture.  This was treated with conservative management.  Hospitalization uncomplicated.  Discharge delayed by difficulty with disposition.  Remains medically stable for discharge.  Accepting facility has some concern about the start date of his disability.   Patient received dose of Zometa on 07/30/2021.  Oncology repeated the PSA which seems rising at 1199 from 184. Patient wants to talk with his oncologist, talk with Ryan [name], he will try to see him soon. 10/25: Ryan [name] gone reordered a repeat PSA to confirm, we will see him after that resulted.     Closed left ankle and wrist fracture.  Secondary to fall.  Managed conservatively by orthopedic surgery.  Splint has been removed.  Able to work with PT and weightbearing.   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   Being followed up by oncology as an outpatient.  Patient received his Zometa today, next dose will be on November 18. Getting Lupron every 68-month, prior dose was approximately a month ago. Repeat PSA done yesterday by oncology with significant rise to 1199 from 184 a month ago. --started on Xtandi on 08/05/21. Plan: --cont p.o. Xtandi 160 mg daily indefinitely, per oncology --Continue monthly Zometa with his next dose due on approximately August 27, 2021. --Lupron q74months.     Cancer cachexia/protein caloric malnutrition, severe. -Encourage p.o. intake with supplements.   Anemia of chronic disease. -Stable  Insomnia --Pt often complains of not being able to sleep at night.  Pt is noted to be sleeping a lot during the day.  Nightly trazodone had stopped working.  Switched to seroquel nightly. --cont seroquel 100 mg nightly for now. --trazodone 150 mg nightly PRN   DVT prophylaxis: [id number]  Code Status: DNR  Family Communication:  Level of care: Med-Surg Dispo:   The patient is from: has been in the hospital for the past year Anticipated d/c is to: SNF Anticipated d/c date is: whenever bed available  Patient currently is medically ready to d/c.   Subjective and Interval History:  Pt was awake and alert during rounds today.  Pt reported continuing his routine of rests and walks.   Objective: Vitals:   08/22/21 0500 08/22/21 0544 08/22/21 0808 08/22/21 1153  BP:  (!) 109/55 110/60 105/67  Pulse:  88 87 85  Resp:  16 15 16   Temp:  98.4 F (36.9 C) 97.6 F (36.4 C) 98.2 F (36.8 C)  TempSrc:      SpO2:  98% 99% 99%  Weight: 63.9 kg     Height:        Intake/Output Summary (Last 24 hours) at 08/22/2021 1504 Last data filed at 08/22/2021 1415 Gross per 24 hour  Intake 240 ml  Output 1350 ml  Net -1110 ml    Filed Weights   08/07/21 0500 08/12/21 0653 08/22/21 0500  Weight: 60.1 kg  64.5 kg 63.9 kg    Examination:   Constitutional: NAD, AAOx3 HEENT: conjunctivae and lids normal, EOMI CV: No cyanosis.   RESP: normal respiratory effort, on RA Neuro: II - XII grossly intact.   Psych: Normal mood and affect.  Appropriate judgement and reason   Data Reviewed: I have personally reviewed following labs and imaging studies  CBC: Recent Labs  Lab 08/18/21 0342  WBC 6.1  HGB 8.0*  HCT 26.6*  MCV 77.8*  PLT 536   Basic Metabolic Panel: Recent Labs  Lab 08/18/21 0342  NA 133*  K 4.8  CL 104  CO2 21*  GLUCOSE 109*   BUN 16  CREATININE 0.81  CALCIUM 8.6*    GFR: Estimated Creatinine Clearance: 88.8 mL/min (by C-G formula based on SCr of 0.81 mg/dL). Liver Function Tests: No results for input(s): AST, ALT, ALKPHOS, BILITOT, PROT, ALBUMIN in the last 168 hours.  No results for input(s): LIPASE, AMYLASE in the last 168 hours. No results for input(s): AMMONIA in the last 168 hours. Coagulation Profile: No results for input(s): INR, PROTIME in the last 168 hours. Cardiac Enzymes: No results for input(s): CKTOTAL, CKMB, CKMBINDEX, TROPONINI in the last 168 hours. BNP (last 3 results) No results for input(s): PROBNP in the last 8760 hours. HbA1C: No results for input(s): HGBA1C in the last 72 hours. CBG: No results for input(s): GLUCAP in the last 168 hours. Lipid Profile: No results for input(s): CHOL, HDL, LDLCALC, TRIG, CHOLHDL, LDLDIRECT in the last 72 hours. Thyroid Function Tests: No results for input(s): TSH, T4TOTAL, FREET4, T3FREE, THYROIDAB in the last 72 hours. Anemia Panel: No results for input(s): VITAMINB12, FOLATE, FERRITIN, TIBC, IRON, RETICCTPCT in the last 72 hours. Sepsis Labs: No results for input(s): PROCALCITON, LATICACIDVEN in the last 168 hours.  No results found for this or any previous visit (from the past 240 hour(s)).    Radiology Studies: No results found.   Scheduled Meds:  calcium carbonate  1,250 mg Oral TID WC   enzalutamide  160 mg Oral Daily   feeding supplement  237 mL Oral TID BM   magnesium oxide  400 mg Oral Daily   midodrine  5 mg Oral TID WC   mirtazapine  15 mg Oral QHS   multivitamin with minerals  1 tablet Oral Daily   QUEtiapine  100 mg Oral QHS   rivaroxaban  10 mg Oral Q supper   senna-docusate  1 tablet Oral BID   Continuous Infusions:  sodium chloride Stopped (06/04/21 1216)     LOS: 80 days     Ryan Bi, MD [facility] If 7PM-7AM, please contact night-coverage 08/22/2021, 3:04 PM

## 2021-08-23 NOTE — TOC Progression Note (Signed)
Transition of Care [facility]) - Progression Note    Patient Details  Name: Ryan [name] MRN: [id number] Date of Birth: 1960/10/31  Transition [facility]) CM/SW [place], RN Phone Number: 08/23/2021, 11:08 AM  Clinical Narrative:   Damaris Schooner with Nira Conn from [facility] and requested a Bed For Medicaid, she will call me back with a time they will come and  assess the patient for a potential bed         Expected Discharge Plan and Services                                                 Social Determinants of Health (SDOH) Interventions    Readmission Risk Interventions Readmission Risk Prevention Plan 04/23/2021  Transportation Screening Complete  PCP or Specialist Appt within 3-5 Days Complete  HRI or [place] Complete  Social Work Consult for [place] Planning/Counseling Not Complete  SW consult not completed comments RNCM assigned to patient  Palliative Care Screening Complete  Medication Review [employer]) Complete  Some recent data might be hidden

## 2021-08-23 NOTE — TOC Progression Note (Signed)
Transition of Care [facility]) - Progression Note    Patient Details  Name: Ryan [name] MRN: [id number] Date of Birth: Jan 28, 1961  Transition of Care [facility]) CM/SW [place], RN Phone Number: 08/23/2021, 10:07 AM  Clinical Narrative:     Reached out to Hydia at [facility] in [place], Asked if she would be coming to meet Varian Soon for eval, awaiting a response       Expected Discharge Plan and Services                                                 Social Determinants of Health (SDOH) Interventions    Readmission Risk Interventions Readmission Risk Prevention Plan 04/23/2021  Transportation Screening Complete  PCP or Specialist Appt within 3-5 Days Complete  HRI or [place] Complete  Social Work Consult for [place] Planning/Counseling Not Complete  SW consult not completed comments RNCM assigned to patient  Palliative Care Screening Complete  Medication Review [employer] photographer) Complete  Some recent data might be hidden

## 2021-08-23 NOTE — Progress Notes (Signed)
PROGRESS NOTE    Ryan [name]  WHQ:[id number] DOB: Nov 26, 1960 DOA: 06/02/2021 PCP: Pcp, No  150A/150A-AA   Assessment & Plan:   Principal Problem:   Closed left ankle fracture Active Problems:   Fall   Anemia of chronic disease   Protein-calorie malnutrition, severe (HCC)   Cancer cachexia (HCC)   Prostate cancer metastatic to bone (HCC)   Left wrist fracture, closed, initial encounter   Hypocalcemia   Hyperkalemia   Closed fracture of left distal radius   Closed bimalleolar fracture of left ankle   60 year old man PMH metastatic prostate cancer presented with a fall resulting in closed left ankle fracture and left wrist fracture.  This was treated with conservative management.  Hospitalization uncomplicated.  Discharge delayed by difficulty with disposition.  Remains medically stable for discharge.  Accepting facility has some concern about the start date of his disability.   Patient received dose of Zometa on 07/30/2021.  Oncology repeated the PSA which seems rising at 1199 from 184. Patient wants to talk with his oncologist, talk with [name], he will try to see him soon. 10/25: Dr. Marc Morgans gone reordered a repeat PSA to confirm, we will see him after that resulted.     Closed left ankle and wrist fracture.  Secondary to fall.  Managed conservatively by orthopedic surgery.  Splint has been removed.  Able to work with PT and weightbearing.   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   Being followed up by oncology as an outpatient.  Patient received his Zometa today, next dose will be on November 18. Getting Lupron every 4-month, prior dose was approximately a month ago. Repeat PSA done yesterday by oncology with significant rise to 1199 from 184 a month ago. --started on Xtandi on 08/05/21. Plan: --cont p.o. Xtandi 160 mg daily indefinitely, per oncology --Continue monthly Zometa with his next dose due on approximately August 27, 2021. --Lupron q56months.     Cancer cachexia/protein caloric malnutrition, severe. -Encourage p.o. intake with supplements.   Anemia of chronic disease. -Stable  Insomnia --Pt often complains of not being able to sleep at night.  Pt is noted to be sleeping a lot during the day.  Nightly trazodone had stopped working.  Switched to seroquel nightly. --cont seroquel 100 mg nightly for now. --trazodone 150 mg nightly PRN   DVT prophylaxis: [id number]  Code Status: DNR  Family Communication:  Level of care: Med-Surg Dispo:   The patient is from: has been in the hospital for the past year Anticipated d/c is to: SNF Anticipated d/c date is: whenever bed available  Patient currently is medically ready to d/c.   Subjective and Interval History:  No acute event.  Pt found sleeping during round.   Objective: Vitals:   08/23/21 0451 08/23/21 0454 08/23/21 0754 08/23/21 1527  BP: (!) 152/57  (!) 102/57 137/62  Pulse: 78  81 79  Resp: 20  16 16   Temp: 98.3 F (36.8 C)  (!) 97.5 F (36.4 C) 98.1 F (36.7 C)  TempSrc: Oral     SpO2: 100%  99% 99%  Weight:  63 kg    Height:        Intake/Output Summary (Last 24 hours) at 08/23/2021 1607 Last data filed at 08/23/2021 0800 Gross per 24 hour  Intake --  Output 1675 ml  Net -1675 ml    Filed Weights   08/12/21 0653 08/22/21 0500 08/23/21 0454  Weight: 64.5 kg 63.9 kg 63 kg    Examination:  Constitutional: NAD, sleeping CV: No cyanosis.   RESP: normal respiratory effort, on RA Extremities: No effusions, edema in BLE   Data Reviewed: I have personally reviewed following labs and imaging studies  CBC: Recent Labs  Lab 08/18/21 0342  WBC 6.1  HGB 8.0*  HCT 26.6*  MCV 77.8*  PLT 671   Basic Metabolic Panel: Recent Labs  Lab 08/18/21 0342  NA 133*  K 4.8  CL 104  CO2 21*  GLUCOSE 109*  BUN 16  CREATININE 0.81  CALCIUM 8.6*    GFR: Estimated Creatinine Clearance: 87.6 mL/min (by C-G formula based on SCr of 0.81 mg/dL). Liver  Function Tests: No results for input(s): AST, ALT, ALKPHOS, BILITOT, PROT, ALBUMIN in the last 168 hours.  No results for input(s): LIPASE, AMYLASE in the last 168 hours. No results for input(s): AMMONIA in the last 168 hours. Coagulation Profile: No results for input(s): INR, PROTIME in the last 168 hours. Cardiac Enzymes: No results for input(s): CKTOTAL, CKMB, CKMBINDEX, TROPONINI in the last 168 hours. BNP (last 3 results) No results for input(s): PROBNP in the last 8760 hours. HbA1C: No results for input(s): HGBA1C in the last 72 hours. CBG: No results for input(s): GLUCAP in the last 168 hours. Lipid Profile: No results for input(s): CHOL, HDL, LDLCALC, TRIG, CHOLHDL, LDLDIRECT in the last 72 hours. Thyroid Function Tests: No results for input(s): TSH, T4TOTAL, FREET4, T3FREE, THYROIDAB in the last 72 hours. Anemia Panel: No results for input(s): VITAMINB12, FOLATE, FERRITIN, TIBC, IRON, RETICCTPCT in the last 72 hours. Sepsis Labs: No results for input(s): PROCALCITON, LATICACIDVEN in the last 168 hours.  No results found for this or any previous visit (from the past 240 hour(s)).    Radiology Studies: No results found.   Scheduled Meds:  calcium carbonate  1,250 mg Oral TID WC   enzalutamide  160 mg Oral Daily   feeding supplement  237 mL Oral TID BM   magnesium oxide  400 mg Oral Daily   midodrine  5 mg Oral TID WC   mirtazapine  15 mg Oral QHS   multivitamin with minerals  1 tablet Oral Daily   QUEtiapine  100 mg Oral QHS   rivaroxaban  10 mg Oral Q supper   senna-docusate  1 tablet Oral BID   Continuous Infusions:  sodium chloride Stopped (06/04/21 1216)     LOS: 81 days     Enzo Bi, MD [facility] If 7PM-7AM, please contact night-coverage 08/23/2021, 4:07 PM

## 2021-08-24 MED ORDER — ACETAMINOPHEN 500 MG PO TABS
1000.0000 mg | ORAL_TABLET | Freq: Three times a day (TID) | ORAL | Status: DC | PRN
  Administered 2021-08-25 – 2021-08-29 (×12): 1000 mg via ORAL
  Filled 2021-08-24 (×13): qty 2

## 2021-08-24 MED ORDER — ACETAMINOPHEN 500 MG PO TABS
ORAL_TABLET | ORAL | Status: AC
  Administered 2021-08-24: 1000 mg via ORAL
  Filled 2021-08-24: qty 2

## 2021-08-24 MED ORDER — IBUPROFEN 400 MG PO TABS
400.0000 mg | ORAL_TABLET | Freq: Once | ORAL | Status: AC
  Administered 2021-08-24: 400 mg via ORAL
  Filled 2021-08-24: qty 1

## 2021-08-24 NOTE — TOC Progression Note (Signed)
Transition of Care [facility]) - Progression Note    Patient Details  Name: Ryan [name] MRN: [id number] Date of Birth: Jun 25, 1961  Transition of Care [facility]) CM/SW [place], RN Phone Number: 08/24/2021, 9:46 AM  Clinical Narrative:    Carole Civil at [facility] stated they would come and assess the patient tomorrow morning at 10 AM for a potential Bed at the ALF        Expected Discharge Plan and Services                                                 Social Determinants of Health (SDOH) Interventions    Readmission Risk Interventions Readmission Risk Prevention Plan 04/23/2021  Transportation Screening Complete  PCP or Specialist Appt within 3-5 Days Complete  HRI or [place] Complete  Social Work Consult for [place] Planning/Counseling Not Complete  SW consult not completed comments RNCM assigned to patient  Palliative Care Screening Complete  Medication Review Press photographer) Complete  Some recent data might be hidden

## 2021-08-24 NOTE — TOC Progression Note (Signed)
Transition of Care [facility]) - Progression Note    Patient Details  Name: Ryan [name] MRN: [id number] Date of Birth: 1961-08-31  Transition of Care [facility]) CM/SW [place], RN Phone Number: 08/24/2021, 3:36 PM  Clinical Narrative:     Damaris Schooner to Nira Conn at [facility] will  send someone out to review the patient tomorrow       Expected Discharge Plan and Services                                                 Social Determinants of Health (SDOH) Interventions    Readmission Risk Interventions Readmission Risk Prevention Plan 04/23/2021  Transportation Screening Complete  PCP or Specialist Appt within 3-5 Days Complete  HRI or [place] Complete  Social Work Consult for [place] Planning/Counseling Not Complete  SW consult not completed comments RNCM assigned to patient  Palliative Care Screening Complete  Medication Review Press photographer) Complete  Some recent data might be hidden

## 2021-08-24 NOTE — Progress Notes (Signed)
PT Cancellation Note  Patient Details Name: Ryan [name] MRN: [id number] DOB: 01-Sep-1961   Cancelled Treatment:    Reason Eval/Treat Not Completed: Fatigue/lethargy limiting ability to participate. Chart reviewed. Upon entry to room pt requesting rest before PT as pt just finished OT session. PT to re-attempt as time allows later today.   [name]. Fairly IV, PT, DPT Physical Therapist- [facility]  08/24/2021, 11:37 AM

## 2021-08-24 NOTE — Progress Notes (Signed)
PROGRESS NOTE    Ryan [name]  UXN:[id number] DOB: [birth year] DOA: 06/02/2021 PCP: Pcp, No  150A/150A-AA   Assessment & Plan:   Principal Problem:   Closed left ankle fracture Active Problems:   Fall   Anemia of chronic disease   Protein-calorie malnutrition, severe (HCC)   Cancer cachexia ([place])   Prostate cancer metastatic to bone ([place])   Left wrist fracture, closed, initial encounter   Hypocalcemia   Hyperkalemia   Closed fracture of left distal radius   Closed bimalleolar fracture of left ankle   Ryan [name] is a 60 y.o. 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   Patient was hospitalized from 6/21-8/24 due to metastasized prostate cancer. He was discharged to a homeless shelter but he felt dizzy and fell accidentally. No LOC. He injured his left wrist and left ankle, and presented back to the hospital on the same day.  Pt had closed left ankle fracture and left wrist fracture.  This was treated with conservative management.  Hospitalization uncomplicated.  Discharge delayed by difficulty with disposition.  Remains medically stable for discharge.     Closed left ankle and wrist fracture: secondary to fall.  Managed conservatively. Splint has been removed.     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  Dr. Cecille Aver managing.  Next dose of zometa on 11/18. Lupron q11months. Continue on xtandi as per oncology.   Severe Cancer cachexia/protein caloric malnutrition, improved --pt has gained weight and looked much improved. Continue on nutritional supplements    Anemia of chronic disease:  H&H are labile.    Insomnia:  continue on seroquel qhs  Encourage pt to stay awake during the day.   DVT prophylaxis: [id number]  Code Status: DNR  Family Communication:  Level of care: Med-Surg Dispo:   The patient is from: has been in the  hospital for the past 6 months.  Pt had been evicted from his prior home. Anticipated d/c is to: SNF or ALF Anticipated d/c date is: whenever bed available  Patient currently is medically ready to d/c.   Subjective and Interval History:  Pt was found sleeping during rounds, woke to say that his wrist hurt again and he took a dose of pain med (tramadol).     Objective: Vitals:   08/23/21 1925 08/24/21 0500 08/24/21 0829 08/24/21 1625  BP: (!) 137/57  (!) 113/58 97/61  Pulse: 78  80 81  Resp: 18  17 18   Temp: 98 F (36.7 C)  98.2 F (36.8 C) 98.3 F (36.8 C)  TempSrc:      SpO2: 99%  100% 100%  Weight:  64.2 kg    Height:        Intake/Output Summary (Last 24 hours) at 08/24/2021 1643 Last data filed at 08/24/2021 1628 Gross per 24 hour  Intake 240 ml  Output 2250 ml  Net -2010 ml    Filed Weights   08/22/21 0500 08/23/21 0454 08/24/21 0500  Weight: 63.9 kg 63 kg 64.2 kg    Examination:   Constitutional: NAD, sleeping but arousable, oriented CV: No cyanosis.   RESP: normal respiratory effort, on RA Extremities: No effusions, edema in BLE Psych: depressed mood and affect today.   Data Reviewed: I have personally reviewed following labs and imaging studies  CBC: Recent Labs  Lab 08/18/21 0342  WBC 6.1  HGB 8.0*  HCT 26.6*  MCV 77.8*  PLT 502   Basic Metabolic Panel: Recent Labs  Lab 08/18/21 0342  NA 133*  K 4.8  CL 104  CO2 21*  GLUCOSE 109*  BUN 16  CREATININE 0.81  CALCIUM 8.6*    GFR: Estimated Creatinine Clearance: 89.2 mL/min (by C-G formula based on SCr of 0.81 mg/dL). Liver Function Tests: No results for input(s): AST, ALT, ALKPHOS, BILITOT, PROT, ALBUMIN in the last 168 hours.  No results for input(s): LIPASE, AMYLASE in the last 168 hours. No results for input(s): AMMONIA in the last 168 hours. Coagulation Profile: No results for input(s): INR, PROTIME in the last 168 hours. Cardiac Enzymes: No results for input(s): CKTOTAL,  CKMB, CKMBINDEX, TROPONINI in the last 168 hours. BNP (last 3 results) No results for input(s): PROBNP in the last 8760 hours. HbA1C: No results for input(s): HGBA1C in the last 72 hours. CBG: No results for input(s): GLUCAP in the last 168 hours. Lipid Profile: No results for input(s): CHOL, HDL, LDLCALC, TRIG, CHOLHDL, LDLDIRECT in the last 72 hours. Thyroid Function Tests: No results for input(s): TSH, T4TOTAL, FREET4, T3FREE, THYROIDAB in the last 72 hours. Anemia Panel: No results for input(s): VITAMINB12, FOLATE, FERRITIN, TIBC, IRON, RETICCTPCT in the last 72 hours. Sepsis Labs: No results for input(s): PROCALCITON, LATICACIDVEN in the last 168 hours.  No results found for this or any previous visit (from the past 240 hour(s)).    Radiology Studies: No results found.   Scheduled Meds:  calcium carbonate  1,250 mg Oral TID WC   enzalutamide  160 mg Oral Daily   feeding supplement  237 mL Oral TID BM   magnesium oxide  400 mg Oral Daily   midodrine  5 mg Oral TID WC   mirtazapine  15 mg Oral QHS   multivitamin with minerals  1 tablet Oral Daily   QUEtiapine  100 mg Oral QHS   rivaroxaban  10 mg Oral Q supper   senna-docusate  1 tablet Oral BID   Continuous Infusions:  sodium chloride Stopped (06/04/21 1216)     LOS: 82 days     Enzo Bi, MD [facility] If 7PM-7AM, please contact night-coverage 08/24/2021, 4:43 PM

## 2021-08-24 NOTE — Evaluation (Signed)
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  Pt recently here for multiple months with difficult d/c planning, did ultimatley d/c to homeless shelter and was gone only a few hours before fall with L ankle and wrist fractures with return to the ED.   Clinical Impression   Pt seen for OT re-evaluation this date in setting of prolonged hospitalization. Upon arrival to room, pt awake in bed with lights off. With MIN encouragement, pt agreeable to OT tx. Pt currently presents with limited AROM of L wrist d/t pain, decreased fine motor coordination of LUE, decreased strength, decreased balance, and decreased activity tolerance. This date, pt engaged in gentle stretching exercises for LUE wrist/digits and was encouraged to weight bear on L wrist during standing grooming ADLs to improve AROM/strength of LUE to facilitate improved functional use of LUE. Pt also demonstrated improved activity tolerance this date; able to stand sink-side for 7 mins while performing x3 grooming tasks and standing UB bathing. Pt is progressing towards goals and 3/3 goals have been met and upgraded. Pt would benefit from additional skilled OT services to maximize return to PLOF and minimize risk of future falls, injury, caregiver burden, and readmission. Upon discharge, recommend [place] [facility] services.   Recommendations for follow up therapy are one component of a multi-disciplinary discharge planning process, led by the attending physician.  Recommendations may be updated based on patient status, additional functional criteria and insurance authorization.   Follow Up Recommendations  Home health OT     Assistance Recommended at Discharge Frequent or constant Supervision/Assistance  Functional Status Assessment  Patient has had a recent decline in their functional status and demonstrates the ability to make significant improvements in function in a reasonable and predictable amount of time.  Equipment Recommendations  Tub/shower seat       Precautions / Restrictions Precautions Precautions: Fall Splint/Cast: pt no longer wearing L LE or L UE splint Other Brace: pt no longer using LLE post-op shoe Restrictions Weight Bearing Restrictions: Yes LUE Weight Bearing: Weight bearing as tolerated LLE Weight Bearing: Weight bearing as tolerated Other Position/Activity Restrictions: pt still prefers use of platform for L UE on RW      Mobility Bed Mobility Overal bed mobility: Modified Independent Bed Mobility: Supine to Sit;Sit to Supine     Supine to sit: Modified independent (Device/Increase time) Sit to supine: Modified independent (Device/Increase time)   General bed mobility comments: increased time, no physical assistance required    Transfers Overall transfer level: Needs assistance Equipment used: Rolling walker (2 wheels) Transfers: Sit to/from Stand Sit to Stand: Min assist Stand pivot transfers: Min assist         General transfer comment: Required min A for steadying      Balance Overall balance assessment: Needs assistance Sitting-balance support: Feet supported;No upper extremity supported Sitting balance-Leahy Scale: Good Sitting balance - Comments: with fatigue, displays L lateral lean Postural control: Left lateral lean Standing balance support: Bilateral upper extremity supported;During functional activity Standing balance-Leahy Scale: Fair Standing balance comment: platform L side                           ADL either  performed or assessed with clinical judgement   ADL Overall ADL's : Needs assistance/impaired     Grooming: Oral  care;Brushing hair;Min guard;Standing Grooming Details (indicate cue type and reason): tolerates standing sink-side for ~5 minutes to complete two grooming tasks with SETUP and CGA for standing balance with platform RW   Upper Body Bathing Details (indicate cue type and reason): tolerates standing sink-side for ~2 minutes to complete UB bathing with SETUP and CGA for standing balance with platform RW Lower Body Bathing: Min guard;Sitting/lateral leans   Upper Body Dressing : Set up;Sitting Upper Body Dressing Details (indicate cue type and reason): to don/doff overhead shirt Lower Body Dressing: Minimal assistance;Sit to/from stand Lower Body Dressing Details (indicate cue type and reason): SETUP for seated donning of slip on shoes, MIN A  for balance with standing clothing mgt over hips.             Functional mobility during ADLs: Min guard;Rolling walker (2 wheels)       Vision Ability to See in Adequate Light: 0 Adequate Patient Visual Report: No change from baseline              Pertinent Vitals/Pain Pain Assessment: No/denies pain        Extremity/Trunk Assessment Upper Extremity Assessment Upper Extremity Assessment: Generalized weakness;LUE deficits/detail LUE Deficits / Details: Difficulty performing digit opposition of 4th and 5th digits and forming pincer grasp d/t stiffness and pain. Limited AROM of wrist flex/ext d/t stiffness and pain   Lower Extremity Assessment Lower Extremity Assessment: Generalized weakness          Cognition Arousal/Alertness: Awake/alert Behavior During Therapy: WFL for tasks assessed/performed Overall Cognitive Status: Within Functional Limits for tasks assessed                                          Exercises Other Exercises Other Exercises: Pt engaged in gentle stretching exercises for LUE wrist/digits and encouraged to weight bear on L wrist during standing grooming ADLs to improve AROM of LUE with ultimate  goal of facilitating improved functional use of LUE.         OT Problem List: Decreased strength;Decreased knowledge of use of DME or AE;Decreased range of motion;Decreased coordination;Decreased activity tolerance;Impaired balance (sitting and/or standing);Pain;Impaired sensation      OT Treatment/Interventions: Self-care/ADL training;Therapeutic exercise;Patient/family education;Balance training;Energy conservation;DME and/or AE instruction;Therapeutic activities    OT Goals(Current goals can be found in the care plan section) Acute Rehab OT Goals Patient Stated Goal: to have less wrist pain OT Goal Formulation: With patient Time For Goal Achievement: 09/21/21 Potential to Achieve Goals: Good ADL Goals Pt Will Perform Grooming: with supervision;standing (with LRAD, sink-side to complete g/h tasks for >8 mins to increase standing tolerance) Pt Will Transfer to Toilet: with supervision;ambulating;regular height toilet  OT Frequency: Min 1X/week   Barriers to D/C: Decreased caregiver support          Co-evaluation              AM-PAC OT "6 Clicks" Daily Activity     Outcome Measure Help from another person eating meals?: None Help from another person taking care of personal grooming?: A Little Help from another person toileting, which includes using toliet, bedpan, or urinal?: A Little Help from another person bathing (including washing, rinsing, drying)?: A Little Help from another person to put on and taking off regular upper body  clothing?: A Little Help from another person to put on and taking off regular lower body clothing?: A Little 6 Click Score: 19   End of Session Equipment Utilized During Treatment: Rolling walker (2 wheels) (platform) Nurse Communication: Mobility status  Activity Tolerance: Patient tolerated treatment well Patient left: in bed;with call bell/phone within reach;with bed alarm set  OT Visit Diagnosis: Unsteadiness on feet (R26.81);Muscle  weakness (generalized) (M62.81);Adult, failure to thrive (R62.7);History of falling (Z91.81) Pain - Right/Left: Left Pain - part of body: Arm;Hand                Time: 3888-2800 OT Time Calculation (min): 31 min Charges:  OT General Charges $OT Visit: 1 Visit OT Evaluation $OT Re-eval: 1 Re-eval OT Treatments $Self Care/Home Management : 8-22 mins $Therapeutic Activity: 8-22 mins  Fredirick Maudlin, OTR/L [place]

## 2021-08-24 NOTE — Progress Notes (Signed)
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  Pt recently here for multiple months with difficult d/c planning, did ultimatley d/c to homeless shelter and was gone only a few hours before fall with L ankle and wrist fractures with return to the ED.    PT Comments    On second attempt pt agreeable to participate with encouragement.  Progressed to L ankle resisted exercise in all planes for strengthening. Pt required minA to stand to RW today due to bed being at lowest surface in attempts to improve Leg strength with functional activity. Tolerated 170' with RW and L platform with cuing to improve foot clearance in swing phase as pt has new shoe wear (sandals). Good carryover post cuing. Returned to seated EoB endorsing ankle fatigue from resistance then ambulation. Bed alarm engaged. D/c recs remain appropriate.   Recommendations for follow up therapy are one component of a multi-disciplinary discharge planning process, led by the attending physician.  Recommendations may be updated based on patient status, additional functional criteria and insurance authorization.  Follow Up Recommendations  Home health PT     Assistance Recommended at Discharge Intermittent Supervision/Assistance  Equipment Recommendations  None recommended by PT    Recommendations for Other Services       Precautions / Restrictions Precautions Precautions: Fall Restrictions Weight Bearing Restrictions: Yes LUE Weight Bearing: Weight bearing as tolerated LLE Weight Bearing: Weight bearing as tolerated     Mobility  Bed Mobility Overal bed mobility: Modified Independent Bed Mobility:  Supine to Sit;Sit to Supine     Supine to sit: Modified independent (Device/Increase time) Sit to supine: Modified independent (Device/Increase time)     Patient Response: Cooperative  Transfers Overall transfer level: Needs assistance Equipment used: Rolling walker (2 wheels) Transfers: Sit to/from Stand   Stand pivot transfers: Min assist         General transfer comment: Required miinA due to standing from bed in lowest surface    Ambulation/Gait Ambulation/Gait assistance: Supervision Gait Distance (Feet): 170 Feet Assistive device: Rolling walker (2 wheels);Left platform walker Gait Pattern/deviations: Trunk flexed;Decreased stride length           Stairs             Wheelchair Mobility    Modified Rankin (Stroke Patients Only)       Balance Overall balance assessment: Needs assistance Sitting-balance support: Feet supported Sitting balance-Leahy Scale: Good     Standing balance support: Bilateral upper extremity supported Standing balance-Leahy Scale: Fair Standing balance comment: platform L side                            Cognition Arousal/Alertness: Awake/alert Behavior During Therapy: WFL for tasks assessed/performed Overall Cognitive Status: Within Functional Limits for tasks assessed                                          Exercises Other Exercises Other Exercises: Resisted ankle exercises with RT: DF/PF/INV/EVER    General Comments        Pertinent Vitals/Pain Pain Assessment: No/denies  pain    Home Living                          Prior Function            PT Goals (current goals can now be found in the care plan section) Acute Rehab PT Goals Patient Stated Goal: to get stronger PT Goal Formulation: With patient Time For Goal Achievement: 08/26/21 Potential to Achieve Goals: Fair Progress towards PT goals: Progressing toward goals    Frequency    Min 2X/week      PT  Plan Current plan remains appropriate    Co-evaluation              AM-PAC PT "6 Clicks" Mobility   Outcome Measure  Help needed turning from your back to your side while in a flat bed without using bedrails?: None Help needed moving from lying on your back to sitting on the side of a flat bed without using bedrails?: None Help needed moving to and from a bed to a chair (including a wheelchair)?: A Little Help needed standing up from a chair using your arms (e.g., wheelchair or bedside chair)?: A Little Help needed to walk in hospital room?: A Little Help needed climbing 3-5 steps with a railing? : A Lot 6 Click Score: 19    End of Session Equipment Utilized During Treatment: Gait belt Activity Tolerance: Patient tolerated treatment well Patient left: in bed;with call bell/phone within reach;with bed alarm set Nurse Communication: Mobility status PT Visit Diagnosis: Unsteadiness on feet (R26.81);Muscle weakness (generalized) (M62.81);Difficulty in walking, not elsewhere classified (R26.2);History of falling (Z91.81)     Time: 1230-1250 PT Time Calculation (min) (ACUTE ONLY): 20 min  Charges:  $Therapeutic Exercise: 8-22 mins                     [name]. Fairly IV, PT, DPT Physical Therapist- [facility]  08/24/2021, 1:18 PM

## 2021-08-24 NOTE — Progress Notes (Signed)
Nutrition Follow-up  DOCUMENTATION CODES:   Severe malnutrition in context of chronic illness, Underweight  INTERVENTION:   -Continue liberalized diet of regular -Continue Ensure Enlive po TID, each supplement provides 350 kcal and 20 grams of protein  -Continue double protein portions with meals -Continue Magic cup TID with meals, each supplement provides 290 kcal and 9 grams of protein  -Continue MVI with minerals daily  NUTRITION DIAGNOSIS:   Severe Malnutrition related to chronic illness, cancer and cancer related treatments as evidenced by severe fat depletion, severe muscle depletion.  Ongoing  GOAL:   Patient will meet greater than or equal to 90% of their needs  Progressing   MONITOR:   PO intake, Supplement acceptance, Labs, Weight trends, I & O's  REASON FOR ASSESSMENT:   Malnutrition Screening Tool    ASSESSMENT:   60 yo male with a PMH of prostate cancer metastasized to bone, cancer cachexia, anemia, depression, and anxiety. Recently admitted 6/21-8/24. Discharged to homeless shelter then returned to ED a few hours later after a fall and pain to his wrist.  Reviewed I/O's: -1.9 L x 24 hours and -17.2 L since 08/10/21  UOP: 1.9 L x 24 hours  Pt unavailable at time of visit. Attempted to speak with pt via call to hospital room phone, however, unable to reach.   Pt remains with good oral intake. Noted meal completions 25-100%. Pt continues to have variable acceptance of Ensure supplements, usually consuming between 1-2 per day.   Reviewed wt hx; wt has been stable over the past 2 months.   Per TOC notes, pt continues to await placement. [facility] to assess pt today.   Medications reviewed and include calcium carbonate, magnesium oxide, remeron, and senokot.   Labs reviewed: Na: 133.    Diet Order:   Diet Order             Diet regular Room service appropriate? Yes; Fluid consistency: Thin  Diet effective now                   EDUCATION  NEEDS:   Education needs have been addressed  Skin:  Skin Assessment: Reviewed RN Assessment  Last BM:  08/23/21  Height:   Ht Readings from Last 1 Encounters:  07/06/21 6\' 5"  (1.956 m)    Weight:   Wt Readings from Last 1 Encounters:  08/24/21 64.2 kg    Ideal Body Weight:  94.5 kg  BMI:  Body mass index is 16.78 kg/m.  Estimated Nutritional Needs:   Kcal:  2400-2600  Protein:  130-150 grams  Fluid:  >2.4 L    Loistine Chance, RD, LDN, [place] Registered Dietitian II Certified Diabetes Care and Education Specialist Please refer to [facility] for RD and/or RD on-call/weekend/after hours pager

## 2021-08-25 LAB — CBC
HCT: 25.7 % — ABNORMAL LOW (ref 39.0–52.0)
Hemoglobin: 8 g/dL — ABNORMAL LOW (ref 13.0–17.0)
MCH: 23.7 pg — ABNORMAL LOW (ref 26.0–34.0)
MCHC: 31.1 g/dL (ref 30.0–36.0)
MCV: 76 fL — ABNORMAL LOW (ref 80.0–100.0)
Platelets: 252 10*3/uL (ref 150–400)
RBC: 3.38 MIL/uL — ABNORMAL LOW (ref 4.22–5.81)
RDW: 14.5 % (ref 11.5–15.5)
WBC: 6.7 10*3/uL (ref 4.0–10.5)
nRBC: 0 % (ref 0.0–0.2)

## 2021-08-25 NOTE — Progress Notes (Signed)
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  Pt recently here for multiple months with difficult d/c planning, did ultimatley d/c to homeless shelter and was gone only a few hours before fall with L ankle and wrist fractures with return to the ED.    PT Comments    Pt remains ISQ with current level of mobility with minguard for transfers and supervision for ambulation. Progressing L ankle strength with higher volume reps. Education provided on reducing UE reliance on RW due to c/ R tricep pain. Reports improvement in symptoms with OOB mobility with RW with cuing throughout amb to reduce Wb'ing on RUE.  Pt continues to display safe OOB mobility with the RW with no balance concerns. Will continue to follow acutely to progress endurance, LRAD, and strengthening to optimize safe, indep mobility.    Recommendations for follow up therapy are one component of a multi-disciplinary discharge planning process, led by the attending physician.  Recommendations may be updated based on patient status, additional functional criteria and insurance authorization.  Follow Up Recommendations  Home health PT     Assistance Recommended at Discharge Intermittent Supervision/Assistance  Equipment Recommendations  None recommended by PT    Recommendations for Other Services       Precautions / Restrictions Precautions Precautions: Fall Spinal Brace Comments: pt no longer using TLSO brace Splint/Cast: pt no longer wearing L LE or L UE splint Other Brace: pt no longer using LLE post-op shoe Restrictions Weight Bearing Restrictions: Yes LUE Weight Bearing: Weight bearing as  tolerated LLE Weight Bearing: Weight bearing as tolerated Other Position/Activity Restrictions: pt still prefers use of platform for L UE on RW     Mobility  Bed Mobility Overal bed mobility: Modified Independent Bed Mobility: Supine to Sit;Sit to Supine     Supine to sit: Modified independent (Device/Increase time)       Patient Response: Cooperative  Transfers Overall transfer level: Needs assistance Equipment used: Rolling walker (2 wheels) Transfers: Sit to/from Stand Sit to Stand: Min guard                Ambulation/Gait Ambulation/Gait assistance: Supervision Gait Distance (Feet): 170 Feet Assistive device: Rolling walker (2 wheels);Left platform walker Gait Pattern/deviations: Trunk flexed;Decreased stride length       General Gait Details: Cuing to promote upright posture for reduced reliance on UE's to help LE strength. Cuing for hip clearance in swing phase due to current footwear placing pt at increased risk of scuffing feet and falling anteriorly.   Stairs             Wheelchair Mobility    Modified Rankin (Stroke Patients Only)       Balance Overall balance assessment: Needs assistance Sitting-balance support: Feet supported;No upper extremity supported Sitting balance-Leahy Scale: Good       Standing balance-Leahy Scale: Fair Standing balance comment: platform L side                            Cognition Arousal/Alertness: Awake/alert Behavior During Therapy: WFL for tasks assessed/performed Overall Cognitive Status: Within Functional Limits for tasks assessed  Exercises Other Exercises Other Exercises: RTB resisted DF, PF, INV, EVER x20/plane, Standing marches with 2-3 sec holds in SLS with RW for balance and ankle strengthening    General Comments General comments (skin integrity, edema, etc.): Educated on progressing WB'ing through LEs to reduce over use  aches and pains in UE's      Pertinent Vitals/Pain Pain Assessment: No/denies pain Pain Location: Does endorse R tricep soreness which required pain meds last night    Home Living                          Prior Function            PT Goals (current goals can now be found in the care plan section) Acute Rehab PT Goals Patient Stated Goal: to get stronger PT Goal Formulation: With patient Time For Goal Achievement: 08/26/21 Potential to Achieve Goals: Fair Progress towards PT goals: Progressing toward goals    Frequency    Min 2X/week      PT Plan Current plan remains appropriate    Co-evaluation              AM-PAC PT "6 Clicks" Mobility   Outcome Measure  Help needed turning from your back to your side while in a flat bed without using bedrails?: None Help needed moving from lying on your back to sitting on the side of a flat bed without using bedrails?: None Help needed moving to and from a bed to a chair (including a wheelchair)?: A Little Help needed standing up from a chair using your arms (e.g., wheelchair or bedside chair)?: A Little Help needed to walk in hospital room?: A Little Help needed climbing 3-5 steps with a railing? : A Little 6 Click Score: 20    End of Session Equipment Utilized During Treatment: Gait belt Activity Tolerance: Patient tolerated treatment well Patient left: in bed;with call bell/phone within reach;with bed alarm set Nurse Communication: Mobility status PT Visit Diagnosis: Unsteadiness on feet (R26.81);Muscle weakness (generalized) (M62.81);Difficulty in walking, not elsewhere classified (R26.2);History of falling (Z91.81) Pain - Right/Left: Left Pain - part of body: Hand     Time: 6808-8110 PT Time Calculation (min) (ACUTE ONLY): 18 min  Charges:  $Therapeutic Exercise: 8-22 mins                    Ryan [name] M. Fairly IV, PT, DPT Physical Therapist- [facility]  08/25/2021,  2:26 PM

## 2021-08-25 NOTE — Progress Notes (Signed)
PROGRESS NOTE    Ryan [name]  MGQ:[id number] DOB: 1961-03-16 DOA: 06/02/2021 PCP: Pcp, No    Brief Narrative:  60 y.o. 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   Patient was hospitalized from 6/21-8/24 due to metastasized prostate cancer. He was discharged to a homeless shelter but he felt dizzy and fell accidentally. No LOC. He injured his left wrist and left ankle, and presented back to the hospital on the same day.   Pt had closed left ankle fracture and left wrist fracture.  This was treated with conservative management.  Hospitalization uncomplicated.  Discharge delayed by difficulty with disposition.  Remains medically stable for discharge.    Assessment & Plan:   Principal Problem:   Closed left ankle fracture Active Problems:   Fall   Anemia of chronic disease   Protein-calorie malnutrition, severe (HCC)   Cancer cachexia ([place])   Prostate cancer metastatic to bone (HCC)   Left wrist fracture, closed, initial encounter   Hypocalcemia   Hyperkalemia   Closed fracture of left distal radius   Closed bimalleolar fracture of left ankle  Closed left ankle and wrist fracture: secondary to fall.  Managed conservatively. Splint has been removed.     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  [name] managing.   Next dose of zometa on 11/18.  Lupron q35months.  Continue on xtandi as per oncology.   Severe Cancer cachexia/protein caloric malnutrition, improved --pt has gained weight and looked much improved. Continue on nutritional supplements    Anemia of chronic disease:  H&H are labile.  No occasion for transfusion   Insomnia:  continue on seroquel qhs  Encourage pt to stay awake during the day.   DVT prophylaxis: Xarelto Code Status: DNR Family Communication: None Disposition Plan: Status is: Inpatient  Remains  inpatient appropriate because: Unsafe discharge plan.  Medically stable for discharge       Level of care: Med-Surg  Consultants:  Oncology  Procedures:  None  Antimicrobials: None   Subjective: Seen and examined.  Resting comfortably in bed.  Does Dors of pain in left shoulder.  Otherwise asymptomatic.  Objective: Vitals:   08/24/21 2030 08/25/21 0022 08/25/21 0607 08/25/21 0725  BP: 112/62 (!) 107/56 (!) 104/57 (!) 102/53  Pulse: 78 79 96 93  Resp: 18 18 17 18   Temp: 98.1 F (36.7 C) (!) 97.2 F (36.2 C) 98.7 F (37.1 C) 97.6 F (36.4 C)  TempSrc:      SpO2: 100% 99% 98% 98%  Weight:      Height:        Intake/Output Summary (Last 24 hours) at 08/25/2021 1436 Last data filed at 08/25/2021 0900 Gross per 24 hour  Intake 820 ml  Output 550 ml  Net 270 ml   Filed Weights   08/22/21 0500 08/23/21 0454 08/24/21 0500  Weight: 63.9 kg 63 kg 64.2 kg    Examination:  General exam: No acute distress Respiratory system: Clear to auscultation. Respiratory effort normal. Cardiovascular system: S1-S2, RRR, no murmurs, no pedal edema Gastrointestinal system: Thin/scaphoid, nontender, nondistended, normal bowel sounds Central nervous system: Alert and oriented. No focal neurological deficits. Extremities: Diffusely decreased power Skin: No rashes, lesions or ulcers Psychiatry: Judgement and insight appear normal. Mood & affect appropriate.     Data Reviewed: I have personally reviewed following labs and imaging studies  CBC: Recent Labs  Lab 08/25/21  0236  WBC 6.7  HGB 8.0*  HCT 25.7*  MCV 76.0*  PLT 680   Basic Metabolic Panel: No results for input(s): NA, K, CL, CO2, GLUCOSE, BUN, CREATININE, CALCIUM, MG, PHOS in the last 168 hours. GFR: Estimated Creatinine Clearance: 89.2 mL/min (by C-G formula based on SCr of 0.81 mg/dL). Liver Function Tests: No results for input(s): AST, ALT, ALKPHOS, BILITOT, PROT, ALBUMIN in the last 168 hours. No results  for input(s): LIPASE, AMYLASE in the last 168 hours. No results for input(s): AMMONIA in the last 168 hours. Coagulation Profile: No results for input(s): INR, PROTIME in the last 168 hours. Cardiac Enzymes: No results for input(s): CKTOTAL, CKMB, CKMBINDEX, TROPONINI in the last 168 hours. BNP (last 3 results) No results for input(s): PROBNP in the last 8760 hours. HbA1C: No results for input(s): HGBA1C in the last 72 hours. CBG: No results for input(s): GLUCAP in the last 168 hours. Lipid Profile: No results for input(s): CHOL, HDL, LDLCALC, TRIG, CHOLHDL, LDLDIRECT in the last 72 hours. Thyroid Function Tests: No results for input(s): TSH, T4TOTAL, FREET4, T3FREE, THYROIDAB in the last 72 hours. Anemia Panel: No results for input(s): VITAMINB12, FOLATE, FERRITIN, TIBC, IRON, RETICCTPCT in the last 72 hours. Sepsis Labs: No results for input(s): PROCALCITON, LATICACIDVEN in the last 168 hours.  No results found for this or any previous visit (from the past 240 hour(s)).       Radiology Studies: No results found.      Scheduled Meds:  calcium carbonate  1,250 mg Oral TID WC   enzalutamide  160 mg Oral Daily   feeding supplement  237 mL Oral TID BM   magnesium oxide  400 mg Oral Daily   midodrine  5 mg Oral TID WC   mirtazapine  15 mg Oral QHS   multivitamin with minerals  1 tablet Oral Daily   QUEtiapine  100 mg Oral QHS   rivaroxaban  10 mg Oral Q supper   senna-docusate  1 tablet Oral BID   Continuous Infusions:  sodium chloride Stopped (06/04/21 1216)     LOS: 83 days    Time spent: 25 minutes    Sidney Ace, MD [facility]   If 7PM-7AM, please contact night-coverage  08/25/2021, 2:36 PM

## 2021-08-25 NOTE — TOC Progression Note (Signed)
Transition of Care [facility]) - Progression Note    Patient Details  Name: Ryan [name] MRN: [id number] Date of Birth: Apr 24, 1961  Transition of Care [facility]) CM/SW [place], RN Phone Number: 08/25/2021, 10:43 AM  Clinical Narrative:   I reached out to Hydia at [facility] in [place] to inquire if they are still coming to meet Ryan [name] today, She was planning to come today by 10 AM and has not shown up.  Awaitng a response         Expected Discharge Plan and Services                                                 Social Determinants of Health (SDOH) Interventions    Readmission Risk Interventions Readmission Risk Prevention Plan 04/23/2021  Transportation Screening Complete  PCP or Specialist Appt within 3-5 Days Complete  HRI or [place] Complete  Social Work Consult for [place] Planning/Counseling Not Complete  SW consult not completed comments RNCM assigned to patient  Palliative Care Screening Complete  Medication Review Press photographer) Complete  Some recent data might be hidden

## 2021-08-25 NOTE — Progress Notes (Signed)
PT Cancellation Note  Patient Details Name: Ryan [name] MRN: [id number] DOB: 1961-06-28   Cancelled Treatment:    Reason Eval/Treat Not Completed: Patient declined, no reason specified. Pt requesting to eat and "get cleaned up" prior to PT. Will re-attempt as able.   [name]. Fairly IV, PT, DPT Physical Therapist- [facility]  08/25/2021, 12:06 PM

## 2021-08-26 ENCOUNTER — Other Ambulatory Visit (HOSPITAL_COMMUNITY): Payer: Self-pay

## 2021-08-26 NOTE — Plan of Care (Signed)
  Problem: Education: Goal: Knowledge of General Education information will improve Description: Including pain rating scale, medication(s)/side effects and non-pharmacologic comfort measures 08/26/2021 1108 by Kerney Elbe, LPN Outcome: Progressing 08/26/2021 1107 by Kerney Elbe, LPN Outcome: Progressing   Problem: Health Behavior/Discharge Planning: Goal: Ability to manage health-related needs will improve 08/26/2021 1108 by Kitiara Hintze J, LPN Outcome: Progressing 08/26/2021 1107 by Kerney Elbe, LPN Outcome: Progressing   Problem: Clinical Measurements: Goal: Ability to maintain clinical measurements within normal limits will improve 08/26/2021 1108 by Letticia Bhattacharyya J, LPN Outcome: Progressing 08/26/2021 1107 by Kerney Elbe, LPN Outcome: Progressing Goal: Will remain free from infection 08/26/2021 1108 by Laney Potash J, LPN Outcome: Progressing 08/26/2021 1107 by Kerney Elbe, LPN Outcome: Progressing Goal: Diagnostic test results will improve 08/26/2021 1108 by Kerney Elbe, LPN Outcome: Progressing 08/26/2021 1107 by Kerney Elbe, LPN Outcome: Progressing Goal: Respiratory complications will improve 08/26/2021 1108 by Kerney Elbe, LPN Outcome: Progressing 08/26/2021 1107 by Kerney Elbe, LPN Outcome: Progressing Goal: Cardiovascular complication will be avoided 08/26/2021 1108 by Kerney Elbe, LPN Outcome: Progressing 08/26/2021 1107 by Kerney Elbe, LPN Outcome: Progressing   Problem: Activity: Goal: Risk for activity intolerance will decrease 08/26/2021 1108 by Camdyn Laden J, LPN Outcome: Progressing 08/26/2021 1107 by Kerney Elbe, LPN Outcome: Progressing   Problem: Nutrition: Goal: Adequate nutrition will be maintained 08/26/2021 1108 by Kerney Elbe, LPN Outcome: Progressing 08/26/2021 1107 by Kerney Elbe, LPN Outcome: Progressing   Problem:  Coping: Goal: Level of anxiety will decrease 08/26/2021 1108 by Clotile Whittington J, LPN Outcome: Progressing 08/26/2021 1107 by Kerney Elbe, LPN Outcome: Progressing   Problem: Elimination: Goal: Will not experience complications related to bowel motility 08/26/2021 1108 by Kerney Elbe, LPN Outcome: Progressing 08/26/2021 1107 by Kerney Elbe, LPN Outcome: Progressing Goal: Will not experience complications related to urinary retention 08/26/2021 1108 by Bradey Luzier J, LPN Outcome: Progressing 08/26/2021 1107 by Kerney Elbe, LPN Outcome: Progressing   Problem: Pain Managment: Goal: General experience of comfort will improve 08/26/2021 1108 by Emylia Latella J, LPN Outcome: Progressing 08/26/2021 1107 by Kerney Elbe, LPN Outcome: Progressing   Problem: Safety: Goal: Ability to remain free from injury will improve Outcome: Progressing   Problem: Skin Integrity: Goal: Risk for impaired skin integrity will decrease Outcome: Progressing   Problem: Education: Goal: Knowledge of the prescribed therapeutic regimen will improve Outcome: Progressing   Problem: Activity: Goal: Ability to increase mobility will improve Outcome: Progressing   Problem: Pain Management: Goal: Pain level will decrease with appropriate interventions Outcome: Progressing

## 2021-08-26 NOTE — TOC Progression Note (Signed)
Transition of Care [facility]) - Progression Note    Patient Details  Name: Ryan [name] MRN: [id number] Date of Birth: [birth year]  Transition of Care [facility]) CM/SW [place], RN Phone Number: 08/26/2021, 3:03 PM  Clinical Narrative:   [place] out to meet with the patient, was in the room with the patient and the admissions director from [facility] and had a productive conversation, The patient is agreeable to go to ALF,  Awaiting [facility] to review and make a bed offer, LOG was offered as well. Heather with [facility] expected to review the  clinical notes and documents and follow up         Expected Discharge Plan and Services                                                 Social Determinants of Health (SDOH) Interventions    Readmission Risk Interventions Readmission Risk Prevention Plan 04/23/2021  Transportation Screening Complete  PCP or Specialist Appt within 3-5 Days Complete  HRI or [place] Complete  Social Work Consult for [place] Planning/Counseling Not Complete  SW consult not completed comments RNCM assigned to patient  Palliative Care Screening Complete  Medication Review Press photographer) Complete  Some recent data might be hidden

## 2021-08-26 NOTE — Progress Notes (Signed)
PROGRESS NOTE    Ryan [name]  WGY:[id number] DOB: 1961/07/01 DOA: 06/02/2021 PCP: Pcp, No    Brief Narrative:  60 y.o. 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   Patient was hospitalized from 6/21-8/24 due to metastasized prostate cancer. He was discharged to a homeless shelter but he felt dizzy and fell accidentally. No LOC. He injured his left wrist and left ankle, and presented back to the hospital on the same day.   Pt had closed left ankle fracture and left wrist fracture.  This was treated with conservative management.  Hospitalization uncomplicated.  Discharge delayed by difficulty with disposition.  Remains medically stable for discharge.    Assessment & Plan:   Principal Problem:   Closed left ankle fracture Active Problems:   Fall   Anemia of chronic disease   Protein-calorie malnutrition, severe (HCC)   Cancer cachexia ([place])   Prostate cancer metastatic to bone (HCC)   Left wrist fracture, closed, initial encounter   Hypocalcemia   Hyperkalemia   Closed fracture of left distal radius   Closed bimalleolar fracture of left ankle  Closed left ankle and wrist fracture: secondary to fall.  Managed conservatively. Splint has been removed.     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  [name] managing.   Next dose of zometa on 11/18.  Lupron q20months.  Continue on xtandi as per oncology.   Severe Cancer cachexia/protein caloric malnutrition, improved --pt has gained weight and looked much improved. Continue on nutritional supplements    Anemia of chronic disease:  H&H are labile.  No occasion for transfusion   Insomnia:  continue on seroquel qhs  Encourage pt to stay awake during the day.   DVT prophylaxis: Xarelto Code Status: DNR Family Communication: None Disposition Plan: Status is: Inpatient  Remains  inpatient appropriate because: Unsafe discharge plan.  Medically stable for discharge       Level of care: Med-Surg  Consultants:  Oncology  Procedures:  None  Antimicrobials: None   Subjective: Patient seen and examined resting in bed.  No complaints.  Feels well  Objective: Vitals:   08/25/21 0725 08/25/21 1622 08/26/21 0544 08/26/21 0547  BP: (!) 102/53 109/67 106/65   Pulse: 93 84 (!) 101   Resp: 18 16 16    Temp: 97.6 F (36.4 C) 98 F (36.7 C) 97.8 F (36.6 C)   TempSrc:  Oral    SpO2: 98% 100% 99%   Weight:    64.7 kg  Height:        Intake/Output Summary (Last 24 hours) at 08/26/2021 0625 Last data filed at 08/26/2021 0400 Gross per 24 hour  Intake 510 ml  Output --  Net 510 ml   Filed Weights   08/23/21 0454 08/24/21 0500 08/26/21 0547  Weight: 63 kg 64.2 kg 64.7 kg    Examination:  General exam: No acute distress Respiratory system: Clear to auscultation. Respiratory effort normal. Cardiovascular system: S1-S2, RRR, no murmurs, no pedal edema Gastrointestinal system: Thin/scaphoid, nontender, nondistended, normal bowel sounds Central nervous system: Alert and oriented. No focal neurological deficits. Extremities: Diffusely decreased power Skin: No rashes, lesions or ulcers Psychiatry: Judgement and insight appear normal. Mood & affect appropriate.     Data Reviewed: I have personally reviewed following labs and imaging studies  CBC: Recent Labs  Lab 08/25/21 0236  WBC 6.7  HGB 8.0*  HCT 25.7*  MCV 76.0*  PLT 371   Basic Metabolic Panel: No results for input(s): NA, K, CL, CO2, GLUCOSE, BUN, CREATININE, CALCIUM, MG, PHOS in the last 168 hours. GFR: Estimated Creatinine Clearance: 89.9 mL/min (by C-G formula based on SCr of 0.81 mg/dL). Liver Function Tests: No results for input(s): AST, ALT, ALKPHOS, BILITOT, PROT, ALBUMIN in the last 168 hours. No results for input(s): LIPASE, AMYLASE in the last 168 hours. No results for  input(s): AMMONIA in the last 168 hours. Coagulation Profile: No results for input(s): INR, PROTIME in the last 168 hours. Cardiac Enzymes: No results for input(s): CKTOTAL, CKMB, CKMBINDEX, TROPONINI in the last 168 hours. BNP (last 3 results) No results for input(s): PROBNP in the last 8760 hours. HbA1C: No results for input(s): HGBA1C in the last 72 hours. CBG: No results for input(s): GLUCAP in the last 168 hours. Lipid Profile: No results for input(s): CHOL, HDL, LDLCALC, TRIG, CHOLHDL, LDLDIRECT in the last 72 hours. Thyroid Function Tests: No results for input(s): TSH, T4TOTAL, FREET4, T3FREE, THYROIDAB in the last 72 hours. Anemia Panel: No results for input(s): VITAMINB12, FOLATE, FERRITIN, TIBC, IRON, RETICCTPCT in the last 72 hours. Sepsis Labs: No results for input(s): PROCALCITON, LATICACIDVEN in the last 168 hours.  No results found for this or any previous visit (from the past 240 hour(s)).       Radiology Studies: No results found.      Scheduled Meds:  calcium carbonate  1,250 mg Oral TID WC   enzalutamide  160 mg Oral Daily   feeding supplement  237 mL Oral TID BM   magnesium oxide  400 mg Oral Daily   midodrine  5 mg Oral TID WC   mirtazapine  15 mg Oral QHS   multivitamin with minerals  1 tablet Oral Daily   QUEtiapine  100 mg Oral QHS   rivaroxaban  10 mg Oral Q supper   senna-docusate  1 tablet Oral BID   Continuous Infusions:  sodium chloride Stopped (06/04/21 1216)     LOS: 84 days    Time spent: 15 minutes    Sidney Ace, MD [facility]   If 7PM-7AM, please contact night-coverage  08/26/2021, 6:25 AM

## 2021-08-27 LAB — PSA: Prostatic Specific Antigen: 921 ng/mL — ABNORMAL HIGH (ref 0.00–4.00)

## 2021-08-27 NOTE — Progress Notes (Signed)
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  Pt recently here for multiple months with difficult d/c planning, did ultimatley d/c to homeless shelter and was gone only a few hours before fall with L ankle and wrist fractures with return to the ED.    PT Comments    Pt received supine in bed. Agreeable to treatment. Remains mod-I for standing and amb with supervision with RW. Progressed pt to upright, Wb'ing LE and ankle exercises to progress strengthening tolerance. PT also progressing to 2 laps around nurses station with minor SOB appreciated post session indicating deficits in endurance. Minor VC's required for upright posture,  improving stance width to reduce risk of tripping on feet. Pt educated on PT/OT plans to remove platform on RW on 11/20 due to no further need from healing stand point. Pt verbalizing understanding. D/c recs remain appropriate.    Recommendations for follow up therapy are one component of a multi-disciplinary discharge planning process, led by the attending physician.  Recommendations may be updated based on patient status, additional functional criteria and insurance authorization.  Follow Up Recommendations  Home health PT     Assistance Recommended at Discharge Intermittent Supervision/Assistance  Equipment Recommendations  None recommended by PT    Recommendations for Other Services       Precautions / Restrictions Precautions Precautions: Fall Restrictions Weight Bearing Restrictions: Yes LUE Weight Bearing: Weight bearing as tolerated LLE Weight Bearing: Weight bearing as tolerated Other Position/Activity Restrictions: pt  still prefers use of platform for L UE on RW, in agreement b/t PT, OT and pt to remove platform on 11/20 due to no further need     Mobility  Bed Mobility Overal bed mobility: Modified Independent Bed Mobility: Supine to Sit;Sit to Supine     Supine to sit: Modified independent (Device/Increase time) Sit to supine: Modified independent (Device/Increase time)   General bed mobility comments: increased time, no physical assistance required Patient Response: Cooperative  Transfers Overall transfer level: Needs assistance Equipment used: Rolling walker (2 wheels) Transfers: Sit to/from Stand Sit to Stand: Min guard           General transfer comment: progressing to lower surface for LE challenge    Ambulation/Gait Ambulation/Gait assistance: Supervision Gait Distance (Feet): 340 Feet Assistive device: Rolling walker (2 wheels);Left platform walker Gait Pattern/deviations: Trunk flexed;Decreased stride length       General Gait Details: Cuing for upright posture, performed 2 laps   Stairs             Wheelchair Mobility    Modified Rankin (Stroke Patients Only)       Balance Overall balance assessment: Needs assistance Sitting-balance support: Feet supported;No upper extremity supported Sitting balance-Leahy Scale: Good       Standing balance-Leahy Scale: Fair                              Cognition Arousal/Alertness: Awake/alert Behavior During Therapy: WFL for tasks assessed/performed Overall Cognitive Status: Within Functional Limits for tasks assessed  Exercises Other Exercises Other Exercises: Marching in standing with focus on SLS x10, mini squats x10, heel raises in standing x10    General Comments        Pertinent Vitals/Pain Pain Assessment: No/denies pain    Home Living                          Prior Function            PT Goals (current goals  can now be found in the care plan section) Acute Rehab PT Goals Patient Stated Goal: to get stronger PT Goal Formulation: With patient Time For Goal Achievement: 09/10/21 Potential to Achieve Goals: Good Progress towards PT goals: Progressing toward goals    Frequency    Min 2X/week      PT Plan Current plan remains appropriate    Co-evaluation              AM-PAC PT "6 Clicks" Mobility   Outcome Measure  Help needed turning from your back to your side while in a flat bed without using bedrails?: None Help needed moving from lying on your back to sitting on the side of a flat bed without using bedrails?: None Help needed moving to and from a bed to a chair (including a wheelchair)?: A Little Help needed standing up from a chair using your arms (e.g., wheelchair or bedside chair)?: A Little Help needed to walk in hospital room?: A Little Help needed climbing 3-5 steps with a railing? : A Little 6 Click Score: 20    End of Session Equipment Utilized During Treatment: Gait belt Activity Tolerance: Patient tolerated treatment well Patient left: in bed;with call bell/phone within reach;with bed alarm set Nurse Communication: Mobility status PT Visit Diagnosis: Unsteadiness on feet (R26.81);Muscle weakness (generalized) (M62.81);Difficulty in walking, not elsewhere classified (R26.2);History of falling (Z91.81) Pain - Right/Left: Left     Time: 6767-2094 PT Time Calculation (min) (ACUTE ONLY): 32 min  Charges:  $Gait Training: 23-37 mins                    [name]. Fairly IV, PT, DPT Physical Therapist- [facility]  08/27/2021, 12:46 PM

## 2021-08-27 NOTE — TOC Progression Note (Signed)
Transition of Care [facility]) - Progression Note    Patient Details  Name: Ryan [name] MRN: [id number] Date of Birth: November 15, 1960  Transition of Care [facility]) CM/SW [place], RN Phone Number: 08/27/2021, 12:46 PM  Clinical Narrative:    Damaris Schooner with Nira Conn at [facility] in [place], they will offer the patient a bed in the ALF with the plan to accept on Monday if his chemo therapy drug is changed to something that is covered under medicaid or if the cancer center can help with the cost.  I reached out the the physician to inquire if this could be done, awaiting a responce        Expected Discharge Plan and Services                                                 Social Determinants of Health (SDOH) Interventions    Readmission Risk Interventions Readmission Risk Prevention Plan 04/23/2021  Transportation Screening Complete  PCP or Specialist Appt within 3-5 Days Complete  HRI or Home Care Consult Complete  Social Work Consult for [place] Planning/Counseling Not Complete  SW consult not completed comments RNCM assigned to patient  Palliative Care Screening Complete  Medication Review Press photographer) Complete  Some recent data might be hidden

## 2021-08-27 NOTE — Progress Notes (Signed)
PROGRESS NOTE    Ryan [name]  GYK:[id number] DOB: 30-Mar-1961 DOA: 06/02/2021 PCP: Pcp, No    Brief Narrative:  60 y.o. 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   Patient was hospitalized from 6/21-8/24 due to metastasized prostate cancer. He was discharged to a homeless shelter but he felt dizzy and fell accidentally. No LOC. He injured his left wrist and left ankle, and presented back to the hospital on the same day.   Pt had closed left ankle fracture and left wrist fracture.  This was treated with conservative management.  Hospitalization uncomplicated.  Discharge delayed by difficulty with disposition.  Remains medically stable for discharge.    Representative from [facility] did meet with the patient on 11/17.  We are currently awaiting their decision regarding acceptance  Assessment & Plan:   Principal Problem:   Closed left ankle fracture Active Problems:   Fall   Anemia of chronic disease   Protein-calorie malnutrition, severe ([place])   Cancer cachexia ([place])   Prostate cancer metastatic to bone ([place])   Left wrist fracture, closed, initial encounter   Hypocalcemia   Hyperkalemia   Closed fracture of left distal radius   Closed bimalleolar fracture of left ankle  Closed left ankle and wrist fracture: secondary to fall.  Managed conservatively. Splint has been removed.  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  [name] managing.   Next dose of zometa on 11/18.  Lupron q18months.  Continue on xtandi as per oncology. Will notify oncology if care facility is willing to accept patient   Severe Cancer cachexia/protein caloric malnutrition, improved --pt has gained weight and looked much improved. Continue on nutritional supplements    Anemia of chronic disease:  H&H are labile.  No  occasion for transfusion   Insomnia:  continue on seroquel qhs  Encourage pt to stay awake during the day.   DVT prophylaxis: Xarelto Code Status: DNR Family Communication: None Disposition Plan: Status is: Inpatient  Remains inpatient appropriate because: Unsafe discharge plan.  Medically stable for discharge       Level of care: Med-Surg  Consultants:  Oncology  Procedures:  None  Antimicrobials: None   Subjective: Patient seen and examined resting in bed.  No complaints.  Objective: Vitals:   08/26/21 1953 08/27/21 0026 08/27/21 0233 08/27/21 0754  BP: 119/65 (!) 105/47 (!) 114/54 (!) 107/51  Pulse: 83 94 86 87  Resp: 18 17 17 14   Temp: 98.1 F (36.7 C) 98.1 F (36.7 C) 97.8 F (36.6 C) 98.8 F (37.1 C)  TempSrc:      SpO2: 99% 97% 100% 97%  Weight:      Height:        Intake/Output Summary (Last 24 hours) at 08/27/2021 1029 Last data filed at 08/27/2021 0946 Gross per 24 hour  Intake --  Output 1100 ml  Net -1100 ml   Filed Weights   08/23/21 0454 08/24/21 0500 08/26/21 0547  Weight: 63 kg 64.2 kg 64.7 kg    Examination:  General exam: No acute distress Respiratory system: Clear to auscultation. Respiratory effort normal. Cardiovascular system: S1-S2, RRR, no murmurs, no pedal edema Gastrointestinal system: Thin/scaphoid, nontender, nondistended, normal bowel sounds Central nervous system: Alert and oriented. No focal neurological deficits. Extremities: Diffusely decreased power Skin: No rashes, lesions or ulcers Psychiatry: Judgement and insight appear normal. Mood &  affect appropriate.     Data Reviewed: I have personally reviewed following labs and imaging studies  CBC: Recent Labs  Lab 08/25/21 0236  WBC 6.7  HGB 8.0*  HCT 25.7*  MCV 76.0*  PLT 280   Basic Metabolic Panel: No results for input(s): NA, K, CL, CO2, GLUCOSE, BUN, CREATININE, CALCIUM, MG, PHOS in the last 168 hours. GFR: Estimated Creatinine Clearance:  89.9 mL/min (by C-G formula based on SCr of 0.81 mg/dL). Liver Function Tests: No results for input(s): AST, ALT, ALKPHOS, BILITOT, PROT, ALBUMIN in the last 168 hours. No results for input(s): LIPASE, AMYLASE in the last 168 hours. No results for input(s): AMMONIA in the last 168 hours. Coagulation Profile: No results for input(s): INR, PROTIME in the last 168 hours. Cardiac Enzymes: No results for input(s): CKTOTAL, CKMB, CKMBINDEX, TROPONINI in the last 168 hours. BNP (last 3 results) No results for input(s): PROBNP in the last 8760 hours. HbA1C: No results for input(s): HGBA1C in the last 72 hours. CBG: No results for input(s): GLUCAP in the last 168 hours. Lipid Profile: No results for input(s): CHOL, HDL, LDLCALC, TRIG, CHOLHDL, LDLDIRECT in the last 72 hours. Thyroid Function Tests: No results for input(s): TSH, T4TOTAL, FREET4, T3FREE, THYROIDAB in the last 72 hours. Anemia Panel: No results for input(s): VITAMINB12, FOLATE, FERRITIN, TIBC, IRON, RETICCTPCT in the last 72 hours. Sepsis Labs: No results for input(s): PROCALCITON, LATICACIDVEN in the last 168 hours.  No results found for this or any previous visit (from the past 240 hour(s)).       Radiology Studies: No results found.      Scheduled Meds:  calcium carbonate  1,250 mg Oral TID WC   enzalutamide  160 mg Oral Daily   feeding supplement  237 mL Oral TID BM   magnesium oxide  400 mg Oral Daily   midodrine  5 mg Oral TID WC   mirtazapine  15 mg Oral QHS   multivitamin with minerals  1 tablet Oral Daily   QUEtiapine  100 mg Oral QHS   rivaroxaban  10 mg Oral Q supper   senna-docusate  1 tablet Oral BID   Continuous Infusions:  sodium chloride Stopped (06/04/21 1216)     LOS: 85 days    Time spent: 15 minutes    Sidney Ace, MD [facility]   If 7PM-7AM, please contact night-coverage  08/27/2021, 10:29 AM

## 2021-08-27 NOTE — TOC Progression Note (Signed)
Transition of Care [facility]) - Progression Note    Patient Details  Name: Ryan [name] MRN: [id number] Date of Birth: 09/16/1961  Transition of Care [facility]) CM/SW [place], RN Phone Number: 08/27/2021, 3:53 PM  Clinical Narrative:     Spoke with Nira Conn from [facility], They are offering the bed and anticipate DC date for Tuesday, she will scan and send me all of the information, He will need the TB test sent to them and they need an FL2 addendum they will send me to get filled out, FL2 also needs sent to [facility]        Expected Discharge Plan and Services                                                 Social Determinants of Health (SDOH) Interventions    Readmission Risk Interventions Readmission Risk Prevention Plan 04/23/2021  Transportation Screening Complete  PCP or Specialist Appt within 3-5 Days Complete  HRI or Home Care Consult Complete  Social Work Consult for [place] Planning/Counseling Not Complete  SW consult not completed comments RNCM assigned to patient  Palliative Care Screening Complete  Medication Review Press photographer) Complete  Some recent data might be hidden

## 2021-08-28 NOTE — Progress Notes (Signed)
PROGRESS NOTE    Ryan [name]  DGL:[id number] DOB: November 21, 1960 DOA: 06/02/2021 PCP: Pcp, No    Brief Narrative:  60 y.o. 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   Patient was hospitalized from 6/21-8/24 due to metastasized prostate cancer. He was discharged to a homeless shelter but he felt dizzy and fell accidentally. No LOC. He injured his left wrist and left ankle, and presented back to the hospital on the same day.   Pt had closed left ankle fracture and left wrist fracture.  This was treated with conservative management.  Hospitalization uncomplicated.  Discharge delayed by difficulty with disposition.  Remains medically stable for discharge.    Representative from [facility] did meet with the patient on 11/17.  Per last TOC note [facility] is offering a bed.  Anticipated discharge date will be Tuesday 11/22 Assessment & Plan:   Principal Problem:   Closed left ankle fracture Active Problems:   Fall   Anemia of chronic disease   Protein-calorie malnutrition, severe (HCC)   Cancer cachexia ([place])   Prostate cancer metastatic to bone ([place])   Left wrist fracture, closed, initial encounter   Hypocalcemia   Hyperkalemia   Closed fracture of left distal radius   Closed bimalleolar fracture of left ankle  Closed left ankle and wrist fracture: secondary to fall.  Managed conservatively. Splint has been removed.  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  [name] managing.   Notified about discharge plan Order PSA per oncology request [place] will continue to follow patient when at assisted living  Severe Cancer cachexia/protein caloric malnutrition, improved --pt has gained weight and looked much improved. Continue on nutritional supplements    Anemia of chronic  disease:  H&H are labile.  No occasion for transfusion   Insomnia:  continue on seroquel qhs  Encourage pt to stay awake during the day.   DVT prophylaxis: Xarelto Code Status: DNR Family Communication: None Disposition Plan: Status is: Inpatient  Remains inpatient appropriate because: Unsafe discharge plan.  Medically stable for discharge       Level of care: Med-Surg  Consultants:  Oncology  Procedures:  None  Antimicrobials: None   Subjective: Patient seen and examined resting in bed.  No complaints.  Objective: Vitals:   08/28/21 0139 08/28/21 0500 08/28/21 0523 08/28/21 0754  BP: 104/61  123/62 (!) 133/53  Pulse: 97  86 92  Resp: 20  19 16   Temp: 98.4 F (36.9 C)  98.5 F (36.9 C) 98.7 F (37.1 C)  TempSrc:      SpO2: 98%  99% 98%  Weight:  63.5 kg    Height:        Intake/Output Summary (Last 24 hours) at 08/28/2021 1050 Last data filed at 08/28/2021 0800 Gross per 24 hour  Intake --  Output 1350 ml  Net -1350 ml   Filed Weights   08/24/21 0500 08/26/21 0547 08/28/21 0500  Weight: 64.2 kg 64.7 kg 63.5 kg    Examination:  General exam: No acute distress Respiratory system: Clear to auscultation. Respiratory effort normal. Cardiovascular system: S1-S2, RRR, no murmurs, no pedal edema Gastrointestinal system: Thin/scaphoid, nontender, nondistended, normal bowel sounds Central nervous system: Alert and oriented. No focal neurological deficits. Extremities: Diffusely decreased power Skin: No rashes, lesions or ulcers Psychiatry: Judgement and insight appear normal. Mood & affect appropriate.  Data Reviewed: I have personally reviewed following labs and imaging studies  CBC: Recent Labs  Lab 08/25/21 0236  WBC 6.7  HGB 8.0*  HCT 25.7*  MCV 76.0*  PLT 413   Basic Metabolic Panel: No results for input(s): NA, K, CL, CO2, GLUCOSE, BUN, CREATININE, CALCIUM, MG, PHOS in the last 168 hours. GFR: Estimated Creatinine Clearance:  88.2 mL/min (by C-G formula based on SCr of 0.81 mg/dL). Liver Function Tests: No results for input(s): AST, ALT, ALKPHOS, BILITOT, PROT, ALBUMIN in the last 168 hours. No results for input(s): LIPASE, AMYLASE in the last 168 hours. No results for input(s): AMMONIA in the last 168 hours. Coagulation Profile: No results for input(s): INR, PROTIME in the last 168 hours. Cardiac Enzymes: No results for input(s): CKTOTAL, CKMB, CKMBINDEX, TROPONINI in the last 168 hours. BNP (last 3 results) No results for input(s): PROBNP in the last 8760 hours. HbA1C: No results for input(s): HGBA1C in the last 72 hours. CBG: No results for input(s): GLUCAP in the last 168 hours. Lipid Profile: No results for input(s): CHOL, HDL, LDLCALC, TRIG, CHOLHDL, LDLDIRECT in the last 72 hours. Thyroid Function Tests: No results for input(s): TSH, T4TOTAL, FREET4, T3FREE, THYROIDAB in the last 72 hours. Anemia Panel: No results for input(s): VITAMINB12, FOLATE, FERRITIN, TIBC, IRON, RETICCTPCT in the last 72 hours. Sepsis Labs: No results for input(s): PROCALCITON, LATICACIDVEN in the last 168 hours.  No results found for this or any previous visit (from the past 240 hour(s)).       Radiology Studies: No results found.      Scheduled Meds:  calcium carbonate  1,250 mg Oral TID WC   enzalutamide  160 mg Oral Daily   feeding supplement  237 mL Oral TID BM   magnesium oxide  400 mg Oral Daily   midodrine  5 mg Oral TID WC   mirtazapine  15 mg Oral QHS   multivitamin with minerals  1 tablet Oral Daily   QUEtiapine  100 mg Oral QHS   rivaroxaban  10 mg Oral Q supper   senna-docusate  1 tablet Oral BID   Continuous Infusions:  sodium chloride Stopped (06/04/21 1216)     LOS: 86 days    Time spent: 15 minutes    Sidney Ace, MD [facility]   If 7PM-7AM, please contact night-coverage  08/28/2021, 10:50 AM

## 2021-08-29 NOTE — Progress Notes (Signed)
PROGRESS NOTE    Ryan [name]  IHK:[id number] DOB: 10-19-1960 DOA: 06/02/2021 PCP: Pcp, No    Brief Narrative:  60 y.o. 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   Patient was hospitalized from 6/21-8/24 due to metastasized prostate cancer. He was discharged to a homeless shelter but he felt dizzy and fell accidentally. No LOC. He injured his left wrist and left ankle, and presented back to the hospital on the same day.   Pt had closed left ankle fracture and left wrist fracture.  This was treated with conservative management.  Hospitalization uncomplicated.  Discharge delayed by difficulty with disposition.  Remains medically stable for discharge.    Representative from [facility] did meet with the patient on 11/17.  Per last TOC note [facility] is offering a bed.  Anticipated discharge date will be Tuesday 11/22  Assessment & Plan:   Principal Problem:   Closed left ankle fracture Active Problems:   Fall   Anemia of chronic disease   Protein-calorie malnutrition, severe (HCC)   Cancer cachexia ([place])   Prostate cancer metastatic to bone ([place])   Left wrist fracture, closed, initial encounter   Hypocalcemia   Hyperkalemia   Closed fracture of left distal radius   Closed bimalleolar fracture of left ankle  Closed left ankle and wrist fracture: secondary to fall.  Managed conservatively. Splint has been removed.  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  [name] managing.   Notified about discharge plan PSA on 11/18 is 921, downtrending [place] will continue to follow patient when at assisted living  Severe Cancer cachexia/protein caloric malnutrition, improved --pt has gained weight and looked much improved. Continue on nutritional supplements    Anemia of chronic  disease:  H&H are labile.  No occasion for transfusion   Insomnia:  continue on seroquel qhs  Encourage pt to stay awake during the day.   DVT prophylaxis: Xarelto Code Status: DNR Family Communication: None Disposition Plan: Status is: Inpatient  Remains inpatient appropriate because: Unsafe discharge plan.  Medically stable for discharge.  Tentative plan for discharge on Tuesday 11/22       Level of care: Med-Surg  Consultants:  Oncology  Procedures:  None  Antimicrobials: None   Subjective: Patient seen and examined resting in bed.  No complaints.  Objective: Vitals:   08/28/21 2025 08/29/21 0330 08/29/21 0642 08/29/21 0745  BP: (!) 116/55 (!) 111/54  (!) 120/55  Pulse: 80 90  92  Resp: 17 17  14   Temp: 98.5 F (36.9 C) 97.6 F (36.4 C)  98.1 F (36.7 C)  TempSrc: Oral Oral    SpO2: 98% 99%  99%  Weight:   63.5 kg   Height:        Intake/Output Summary (Last 24 hours) at 08/29/2021 1057 Last data filed at 08/29/2021 0900 Gross per 24 hour  Intake --  Output 2450 ml  Net -2450 ml   Filed Weights   08/26/21 0547 08/28/21 0500 08/29/21 0642  Weight: 64.7 kg 63.5 kg 63.5 kg    Examination:  General exam: No acute distress Respiratory system: Clear to auscultation. Respiratory effort normal. Cardiovascular system: S1-S2, RRR, no murmurs, no pedal edema Gastrointestinal system: Thin/scaphoid, nontender, nondistended, normal bowel sounds Central nervous system: Alert and oriented. No focal neurological deficits. Extremities: Diffusely decreased power Skin: No rashes, lesions  or ulcers Psychiatry: Judgement and insight appear normal. Mood & affect appropriate.     Data Reviewed: I have personally reviewed following labs and imaging studies  CBC: Recent Labs  Lab 08/25/21 0236  WBC 6.7  HGB 8.0*  HCT 25.7*  MCV 76.0*  PLT 169   Basic Metabolic Panel: No results for input(s): NA, K, CL, CO2, GLUCOSE, BUN, CREATININE, CALCIUM, MG, PHOS  in the last 168 hours. GFR: Estimated Creatinine Clearance: 88.2 mL/min (by C-G formula based on SCr of 0.81 mg/dL). Liver Function Tests: No results for input(s): AST, ALT, ALKPHOS, BILITOT, PROT, ALBUMIN in the last 168 hours. No results for input(s): LIPASE, AMYLASE in the last 168 hours. No results for input(s): AMMONIA in the last 168 hours. Coagulation Profile: No results for input(s): INR, PROTIME in the last 168 hours. Cardiac Enzymes: No results for input(s): CKTOTAL, CKMB, CKMBINDEX, TROPONINI in the last 168 hours. BNP (last 3 results) No results for input(s): PROBNP in the last 8760 hours. HbA1C: No results for input(s): HGBA1C in the last 72 hours. CBG: No results for input(s): GLUCAP in the last 168 hours. Lipid Profile: No results for input(s): CHOL, HDL, LDLCALC, TRIG, CHOLHDL, LDLDIRECT in the last 72 hours. Thyroid Function Tests: No results for input(s): TSH, T4TOTAL, FREET4, T3FREE, THYROIDAB in the last 72 hours. Anemia Panel: No results for input(s): VITAMINB12, FOLATE, FERRITIN, TIBC, IRON, RETICCTPCT in the last 72 hours. Sepsis Labs: No results for input(s): PROCALCITON, LATICACIDVEN in the last 168 hours.  No results found for this or any previous visit (from the past 240 hour(s)).       Radiology Studies: No results found.      Scheduled Meds:  calcium carbonate  1,250 mg Oral TID WC   enzalutamide  160 mg Oral Daily   feeding supplement  237 mL Oral TID BM   magnesium oxide  400 mg Oral Daily   midodrine  5 mg Oral TID WC   mirtazapine  15 mg Oral QHS   multivitamin with minerals  1 tablet Oral Daily   QUEtiapine  100 mg Oral QHS   rivaroxaban  10 mg Oral Q supper   senna-docusate  1 tablet Oral BID   Continuous Infusions:  sodium chloride Stopped (06/04/21 1216)     LOS: 87 days    Time spent: 15 minutes    Sidney Ace, MD [facility]   If 7PM-7AM, please contact night-coverage  08/29/2021, 10:57 AM

## 2021-08-30 MED ORDER — ENZALUTAMIDE 40 MG PO TABS
160.0000 mg | ORAL_TABLET | Freq: Once | ORAL | Status: AC
  Administered 2021-08-30: 160 mg via ORAL
  Filled 2021-08-30: qty 4

## 2021-08-30 NOTE — Progress Notes (Signed)
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  Pt recently here for multiple months with difficult d/c planning, did ultimatley d/c to homeless shelter and was gone only a few hours before fall with L ankle and wrist fractures with return to the ED.    PT Comments    Pt received supine in bed agreeable to PT services. Agreeable to PT session. Despite frewuent multi-disciplinary education last week to remove platform on RW, pt resistant to remove and amb without it despite education and encouragement. Prior to amb, performed seated and standing therex listed as below. Still weaker in INV/EVER on L ankle compared to R ankle but improved motor control noted. Tolerated standing from lower surface with minguard due to pt preference to progress LE strengthening, and amb 340' with RW.  Still requiring frequent VC's for upright posture but overall displays safe use of RW and stability with amb. PT still to rec [facility] PT.   Recommendations for follow up therapy are one component of a multi-disciplinary discharge planning process, led by the attending physician.  Recommendations may be updated based on patient status, additional functional criteria and insurance authorization.  Follow Up Recommendations  Home health PT     Assistance Recommended at Discharge Intermittent Supervision/Assistance  Equipment Recommendations  None recommended by PT    Recommendations for Other Services       Precautions / Restrictions Precautions Precautions: Fall Precaution Comments: WBAT Restrictions Weight Bearing Restrictions: Yes LUE Weight Bearing: Weight bearing as tolerated LLE Weight  Bearing: Weight bearing as tolerated Other Position/Activity Restrictions: Pt not agreeable to removal of L platform despite multidisciplinary education last week to progress without it     Mobility  Bed Mobility Overal bed mobility: Modified Independent Bed Mobility: Supine to Sit;Sit to Supine     Supine to sit: Modified independent (Device/Increase time) Sit to supine: Modified independent (Device/Increase time)   General bed mobility comments: increased time, no physical assistance required Patient Response: Cooperative  Transfers Overall transfer level: Needs assistance Equipment used: Rolling walker (2 wheels) Transfers: Sit to/from Stand Sit to Stand: Min guard Stand pivot transfers: Min assist         General transfer comment: progressing to lower surface for LE challenge    Ambulation/Gait Ambulation/Gait assistance: Supervision Gait Distance (Feet): 340 Feet Assistive device: Rolling walker (2 wheels);Left platform walker Gait Pattern/deviations: Trunk flexed;Decreased stride length       General Gait Details: Cuing for upright posture, performed 2 laps. Still resistant to ambulating without L platform on RW. Requires frequent cuing for upright posture   Stairs             Wheelchair Mobility    Modified Rankin (Stroke Patients Only)       Balance Overall balance assessment: Needs assistance Sitting-balance support: Feet supported;No upper extremity supported Sitting balance-Leahy Scale: Good Sitting balance - Comments: with fatigue, displays L lateral lean Postural control: Left lateral lean Standing balance support: Bilateral upper extremity supported;During functional activity Standing balance-Leahy Scale: Fair Standing balance comment: platform L side on RW  Cognition Arousal/Alertness: Awake/alert Behavior During Therapy: WFL for tasks assessed/performed Overall Cognitive Status: Within Functional  Limits for tasks assessed                                          Exercises Other Exercises Other Exercises: GTB resisted DF/PF/INV/EVER on R ankle, GTb DF/PF and RTB INV/EVER on L x12/direction. [place]ock marches with SLS for 3-5 sec/LE Other Exercises: Dynamic standing balance tolerance; ADLs in sitting and standing using L UE; therex in standing    General Comments        Pertinent Vitals/Pain Pain Assessment: No/denies pain Pain Score: 2  Pain Location: L LE Pain Intervention(s): Repositioned;Limited activity within patient's tolerance    Home Living Family/patient expects to be discharged to:: Assisted living                        Prior Function            PT Goals (current goals can now be found in the care plan section) Acute Rehab PT Goals Patient Stated Goal: to get stronger PT Goal Formulation: With patient Time For Goal Achievement: 09/10/21 Potential to Achieve Goals: Good Progress towards PT goals: Progressing toward goals    Frequency    Min 2X/week      PT Plan Current plan remains appropriate    Co-evaluation              AM-PAC PT "6 Clicks" Mobility   Outcome Measure  Help needed turning from your back to your side while in a flat bed without using bedrails?: None Help needed moving from lying on your back to sitting on the side of a flat bed without using bedrails?: None Help needed moving to and from a bed to a chair (including a wheelchair)?: A Little Help needed standing up from a chair using your arms (e.g., wheelchair or bedside chair)?: A Little Help needed to walk in hospital room?: A Little Help needed climbing 3-5 steps with a railing? : A Little 6 Click Score: 20    End of Session Equipment Utilized During Treatment: Gait belt Activity Tolerance: Patient tolerated treatment well Patient left: in bed;with call bell/phone within reach;with bed alarm set Nurse Communication: Mobility status PT  Visit Diagnosis: Unsteadiness on feet (R26.81);Muscle weakness (generalized) (M62.81);Difficulty in walking, not elsewhere classified (R26.2);History of falling (Z91.81)     Time: 1025-8527 PT Time Calculation (min) (ACUTE ONLY): 20 min  Charges:  $Therapeutic Exercise: 8-22 mins                    [name]. Fairly IV, PT, DPT Physical Therapist- [facility]  08/30/2021, 2:55 PM

## 2021-08-30 NOTE — Progress Notes (Signed)
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  Pt recently here for multiple months with difficult d/c planning, did ultimatley d/c to homeless shelter and was gone only a few hours before fall with L ankle and wrist fractures with return to the ED.   OT comments  Mr. Loeza made very good effort in therapy today, expresses enthusiasm for upcoming transfer to ALF. He performs bed mobility, transfers, UB and LB dressing, grooming, toileting, all with Mod I - CGA, in sitting and standing, no LOB. Displays L lateral lean in sitting towards end of session, with fatigue -- able to self-correct when given verbal cue. Performs therex in standing, displaying good form and understanding of movements. Continues to complain of pain in L UE and displays reduced wrist ROM. Finger opposition on L hand is improved compared to previous week's session. Provided educ re: importance of continuing HEP post DC. Pt verbalizes understanding.   Recommendations for follow up therapy are one component of a multi-disciplinary discharge planning process, led by the attending physician.  Recommendations may be updated based on patient status, additional functional criteria and insurance authorization.    Follow Up Recommendations  Home health OT    Assistance Recommended at Discharge Frequent or constant Supervision/Assistance  Equipment Recommendations       Recommendations for Other Services      Precautions / Restrictions Precautions Precautions: Fall Precaution Comments: WBAT Restrictions LUE Weight Bearing: Weight bearing as tolerated LLE Weight Bearing: Weight bearing as  tolerated Other Position/Activity Restrictions: pt still prefers use of platform for L UE on RW, in agreement b/t PT, OT and pt to remove platform on 11/20 due to no further need       Mobility Bed Mobility Overal bed mobility: Modified Independent Bed Mobility: Supine to Sit;Sit to Supine     Supine to sit: Modified independent (Device/Increase time) Sit to supine: Modified independent (Device/Increase time)   General bed mobility comments: increased time, no physical assistance required    Transfers Overall transfer level: Needs assistance Equipment used: Rolling walker (2 wheels) Transfers: Sit to/from Stand Sit to Stand: Min guard Stand pivot transfers: Min assist               Balance Overall balance assessment: Needs assistance Sitting-balance support: Feet supported;No upper extremity supported Sitting balance-Leahy Scale: Good Sitting balance - Comments: with fatigue, displays L lateral lean Postural control: Left lateral lean Standing balance support: Bilateral upper extremity supported;During functional activity Standing balance-Leahy Scale: Good Standing balance comment: platform L side                           ADL either performed or assessed with clinical judgement   ADL Overall ADL's : Needs assistance/impaired Eating/Feeding: Independent;Sitting   Grooming: Oral care;Brushing hair;Sitting;Wash/dry face;Wash/dry hands;Supervision/safety Grooming Details (indicate cue type and reason): increased time/effort; requires cueing for sequencing, continuation Upper Body Bathing: Modified independent;Set up;Sitting       Upper Body Dressing : Modified independent;Sitting Upper Body Dressing Details (indicate cue type and reason): to don/doff overhead shirt Lower Body Dressing: Supervision/safety;Sit to/from stand Lower Body Dressing Details (indicate cue type and reason): donning socks, pants  Functional mobility during ADLs:  Min guard;Rolling walker (2 wheels) General ADL Comments: Mod I/SUPV for LB, UB dressing. VCs and frequent redirection required for attention to tasks    Extremity/Trunk Assessment Upper Extremity Assessment Upper Extremity Assessment: Generalized weakness LUE Deficits / Details: Improved ability to perform digit opposition. Limited AROM of wrist flex/ext, 2/2 pain, stiffness   Lower Extremity Assessment Lower Extremity Assessment: Generalized weakness LLE Deficits / Details: AROM ankle WFL. generalized weakness throughout   Cervical / Trunk Assessment Cervical / Trunk Assessment: Normal    Vision       Perception     Praxis      Cognition Arousal/Alertness: Awake/alert Behavior During Therapy: WFL for tasks assessed/performed Overall Cognitive Status: Within Functional Limits for tasks assessed                                            Exercises Other Exercises Other Exercises: Dynamic standing balance tolerance; ADLs in sitting and standing using L UE; therex in standing   Shoulder Instructions       General Comments      Pertinent Vitals/ Pain       Pain Score: 2  Pain Location: L LE Pain Intervention(s): Repositioned;Limited activity within patient's tolerance  Home Living Family/patient expects to be discharged to:: Assisted living                                        Prior Functioning/Environment              Frequency  Min 1X/week        Progress Toward Goals  OT Goals(current goals can now be found in the care plan section)  Progress towards OT goals: Progressing toward goals  Acute Rehab OT Goals Patient Stated Goal: to spend time outdoors OT Goal Formulation: With patient Time For Goal Achievement: 09/21/21 Potential to Achieve Goals: Good  Plan Frequency remains appropriate;Discharge plan remains appropriate    Co-evaluation                 AM-PAC OT "6 Clicks" Daily Activity      Outcome Measure   Help from another person eating meals?: None Help from another person taking care of personal grooming?: A Little Help from another person toileting, which includes using toliet, bedpan, or urinal?: A Little Help from another person bathing (including washing, rinsing, drying)?: A Little Help from another person to put on and taking off regular upper body clothing?: A Little Help from another person to put on and taking off regular lower body clothing?: A Little 6 Click Score: 19    End of Session Equipment Utilized During Treatment: Rolling walker (2 wheels)  OT Visit Diagnosis: Unsteadiness on feet (R26.81);Muscle weakness (generalized) (M62.81);History of falling (Z91.81) Pain - Right/Left: Left Pain - part of body: Arm;Hand   Activity Tolerance Patient tolerated treatment well   Patient Left in bed;with call bell/phone within reach;with bed alarm set;with nursing/sitter in room   Nurse Communication Mobility status        Time: 4268-3419 OT Time Calculation (min): 26 min  Charges: OT General Charges $OT Visit: 1 Visit OT Treatments $Self Care/Home Management : 8-22 mins $Therapeutic Exercise: 8-22 mins  Josiah Lobo, PhD, MS, OTR/L 08/30/21, 1:38 PM

## 2021-08-30 NOTE — Progress Notes (Signed)
PROGRESS NOTE    Ryan [name]  XAJ:[id number] DOB: [birth year] DOA: 06/02/2021 PCP: Pcp, No    Brief Narrative:  60 y.o. 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   Patient was hospitalized from 6/21-8/24 due to metastasized prostate cancer. He was discharged to a homeless shelter but he felt dizzy and fell accidentally. No LOC. He injured his left wrist and left ankle, and presented back to the hospital on the same day.   Pt had closed left ankle fracture and left wrist fracture.  This was treated with conservative management.  Hospitalization uncomplicated.  Discharge delayed by difficulty with disposition.  Remains medically stable for discharge.    Representative from [facility] did meet with the patient on 11/17.  Per last TOC note [facility] is offering a bed.  Anticipated discharge date will be Tuesday 11/22  Assessment & Plan:   Principal Problem:   Closed left ankle fracture Active Problems:   Fall   Anemia of chronic disease   Protein-calorie malnutrition, severe (HCC)   Cancer cachexia ([place])   Prostate cancer metastatic to bone ([place])   Left wrist fracture, closed, initial encounter   Hypocalcemia   Hyperkalemia   Closed fracture of left distal radius   Closed bimalleolar fracture of left ankle  Closed left ankle and wrist fracture: secondary to fall.  Managed conservatively. Splint has been removed.  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  [name] managing.   Notified about discharge plan PSA on 11/18 is 921, downtrending Cancer center aware of patient's discharge.  We will continue to follow patient when assisted living  Severe Cancer cachexia/protein caloric malnutrition, improved --pt has gained weight and looked much improved. Continue on nutritional  supplements    Anemia of chronic disease:  H&H are labile.  No occasion for transfusion   Insomnia:  continue on seroquel qhs  Encourage pt to stay awake during the day.   DVT prophylaxis: Xarelto Code Status: DNR Family Communication: None Disposition Plan: Status is: Inpatient  Remains inpatient appropriate because: Unsafe discharge plan.  Medically stable for discharge.  Tentative plan for discharge on Tuesday 11/22       Level of care: Med-Surg  Consultants:  Oncology  Procedures:  None  Antimicrobials: None   Subjective: Patient seen and examined resting in bed.  No complaints.  Objective: Vitals:   08/29/21 2348 08/30/21 0509 08/30/21 0511 08/30/21 0735  BP: (!) 118/57 (!) 121/58 (!) 123/59 115/65  Pulse: 86 80 93 93  Resp: 18 17 18 18   Temp: 98 F (36.7 C) 98.2 F (36.8 C) (!) 97.4 F (36.3 C) 98 F (36.7 C)  TempSrc: Oral Oral  Oral  SpO2: 98% 99% 100% 98%  Weight:   63.3 kg   Height:        Intake/Output Summary (Last 24 hours) at 08/30/2021 1100 Last data filed at 08/30/2021 0844 Gross per 24 hour  Intake --  Output 1600 ml  Net -1600 ml   Filed Weights   08/28/21 0500 08/29/21 0642 08/30/21 0511  Weight: 63.5 kg 63.5 kg 63.3 kg    Examination:  General exam: No acute distress Respiratory system: Clear to auscultation. Respiratory effort normal. Cardiovascular system: S1-S2, RRR, no murmurs, no pedal edema Gastrointestinal system: Thin/scaphoid, nontender, nondistended, normal bowel sounds Central nervous system: Alert and oriented. No focal neurological  deficits. Extremities: Diffusely decreased power Skin: No rashes, lesions or ulcers Psychiatry: Judgement and insight appear normal. Mood & affect appropriate.     Data Reviewed: I have personally reviewed following labs and imaging studies  CBC: Recent Labs  Lab 08/25/21 0236  WBC 6.7  HGB 8.0*  HCT 25.7*  MCV 76.0*  PLT 034   Basic Metabolic Panel: No results for  input(s): NA, K, CL, CO2, GLUCOSE, BUN, CREATININE, CALCIUM, MG, PHOS in the last 168 hours. GFR: Estimated Creatinine Clearance: 87.9 mL/min (by C-G formula based on SCr of 0.81 mg/dL). Liver Function Tests: No results for input(s): AST, ALT, ALKPHOS, BILITOT, PROT, ALBUMIN in the last 168 hours. No results for input(s): LIPASE, AMYLASE in the last 168 hours. No results for input(s): AMMONIA in the last 168 hours. Coagulation Profile: No results for input(s): INR, PROTIME in the last 168 hours. Cardiac Enzymes: No results for input(s): CKTOTAL, CKMB, CKMBINDEX, TROPONINI in the last 168 hours. BNP (last 3 results) No results for input(s): PROBNP in the last 8760 hours. HbA1C: No results for input(s): HGBA1C in the last 72 hours. CBG: No results for input(s): GLUCAP in the last 168 hours. Lipid Profile: No results for input(s): CHOL, HDL, LDLCALC, TRIG, CHOLHDL, LDLDIRECT in the last 72 hours. Thyroid Function Tests: No results for input(s): TSH, T4TOTAL, FREET4, T3FREE, THYROIDAB in the last 72 hours. Anemia Panel: No results for input(s): VITAMINB12, FOLATE, FERRITIN, TIBC, IRON, RETICCTPCT in the last 72 hours. Sepsis Labs: No results for input(s): PROCALCITON, LATICACIDVEN in the last 168 hours.  No results found for this or any previous visit (from the past 240 hour(s)).       Radiology Studies: No results found.      Scheduled Meds:  calcium carbonate  1,250 mg Oral TID WC   feeding supplement  237 mL Oral TID BM   magnesium oxide  400 mg Oral Daily   midodrine  5 mg Oral TID WC   mirtazapine  15 mg Oral QHS   multivitamin with minerals  1 tablet Oral Daily   QUEtiapine  100 mg Oral QHS   rivaroxaban  10 mg Oral Q supper   senna-docusate  1 tablet Oral BID   Continuous Infusions:  sodium chloride Stopped (06/04/21 1216)     LOS: 88 days    Time spent: 15 minutes    Sidney Ace, MD [facility]   If 7PM-7AM, please contact  night-coverage  08/30/2021, 11:00 AM

## 2021-08-31 ENCOUNTER — Other Ambulatory Visit: Payer: Self-pay | Admitting: Pharmacist

## 2021-08-31 ENCOUNTER — Other Ambulatory Visit: Payer: Self-pay

## 2021-08-31 DIAGNOSIS — C7951 Secondary malignant neoplasm of bone: Secondary | ICD-10-CM

## 2021-08-31 MED ORDER — MIRTAZAPINE 15 MG PO TABS: 15.0000 mg | ORAL_TABLET | Freq: Every day | ORAL | 0 refills | Status: DC

## 2021-08-31 MED ORDER — TRAZODONE HCL 150 MG PO TABS
150.0000 mg | ORAL_TABLET | Freq: Every evening | ORAL | Status: DC | PRN
Start: 2021-08-31 — End: 2021-12-25

## 2021-08-31 MED ORDER — ENZALUTAMIDE 40 MG PO TABS: 160.0000 mg | ORAL_TABLET | Freq: Every day | ORAL | 0 refills | Status: DC

## 2021-08-31 MED ORDER — HYDROXYZINE HCL 25 MG PO TABS
25.0000 mg | ORAL_TABLET | Freq: Three times a day (TID) | ORAL | 0 refills | Status: DC | PRN
Start: 2021-08-31 — End: 2021-12-25

## 2021-08-31 MED ORDER — MIDODRINE HCL 5 MG PO TABS
5.0000 mg | ORAL_TABLET | Freq: Three times a day (TID) | ORAL | Status: DC
Start: 2021-08-31 — End: 2021-12-25

## 2021-08-31 MED ORDER — MAGNESIUM OXIDE -MG SUPPLEMENT 400 (240 MG) MG PO TABS: 400.0000 mg | ORAL_TABLET | Freq: Every day | ORAL | Status: DC

## 2021-08-31 MED ORDER — QUETIAPINE FUMARATE 100 MG PO TABS: 100.0000 mg | ORAL_TABLET | Freq: Every day | ORAL | Status: DC

## 2021-08-31 MED ORDER — ENZALUTAMIDE 40 MG PO TABS
160.0000 mg | ORAL_TABLET | Freq: Once | ORAL | Status: DC
  Filled 2021-08-31: qty 4

## 2021-08-31 MED ORDER — ENSURE ENLIVE PO LIQD
237.0000 mL | Freq: Three times a day (TID) | ORAL | 12 refills | Status: DC
  Filled 2021-08-31: qty 237, 1d supply, fill #0

## 2021-08-31 NOTE — Discharge Summary (Signed)
Physician Discharge Summary  Ryan [name] UKG:[id number] DOB: 1961/02/13 DOA: 06/02/2021  PCP: Merryl Hacker, No  Admit date: 06/02/2021 Discharge date: 08/31/2021  Admitted From: Home Disposition: Assisted living  Recommendations for Outpatient Follow-up:  Follow up with PCP in 1-2 weeks Follow-up with oncology 1 to 2 weeks  Home Health: No Equipment/Devices: None  Discharge Condition: Stable CODE STATUS: DNR Diet recommendation: Regular  Brief/Interim Summary: 60 y.o. 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   Patient was hospitalized from 6/21-8/24 due to metastasized prostate cancer. He was discharged to a homeless shelter but he felt dizzy and fell accidentally. No LOC. He injured his left wrist and left ankle, and presented back to the hospital on the same day.   Pt had closed left ankle fracture and left wrist fracture.  This was treated with conservative management.  Hospitalization uncomplicated.  Discharge delayed by difficulty with disposition.  Remains medically stable for discharge.     Representative from [facility] did meet with the patient on 11/17.  Fortunately accepted patient.  Patient discharged to [facility] on 11/22.  At time of discharge patient Ryan [name] has been held.  Will need to contact cancer center to discuss restarting this medication.      Discharge Diagnoses:  Principal Problem:   Closed left ankle fracture Active Problems:   Fall   Anemia of chronic disease   Protein-calorie malnutrition, severe (HCC)   Cancer cachexia ([place])   Prostate cancer metastatic to bone (HCC)   Left wrist fracture, closed, initial encounter   Hypocalcemia   Hyperkalemia   Closed fracture of left distal radius   Closed bimalleolar fracture of left ankle  Closed left ankle and wrist fracture: secondary to  fall.  Managed conservatively. Splint has been removed.  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  [name] managing.   Aware plan to discharge to ALF PSA on 11/18 is 921, downtrending Cancer center aware of patient's discharge.   Will continue to follow patient when at assisted living   Severe Cancer cachexia/protein caloric malnutrition, improved --pt has gained weight and looked much improved. Continue on nutritional supplements    Anemia of chronic disease:  H&H are labile.  No indication for transfusion  Insomnia:  continue on seroquel qhs  Encourage pt to stay awake during the day.  Discharge Instructions  Discharge Instructions     Diet - low sodium heart healthy   Complete by: As directed    Increase activity slowly   Complete by: As directed       Allergies as of 08/31/2021   No Known Allergies      Medication List     STOP taking these medications    dexamethasone 4 MG tablet Commonly known as: DECADRON   methocarbamol 500 MG tablet Commonly known as: ROBAXIN       TAKE these medications    feeding supplement Liqd Take 237 mLs by mouth 3 (three) times daily between meals.   hydrOXYzine 25 MG tablet Commonly known as: ATARAX/VISTARIL Take 1 tablet (25 mg total) by mouth 3 (three) times daily as needed for anxiety.   magnesium oxide 400 (240 Mg) MG tablet Commonly known as: MAG-OX Take 1 tablet (400 mg total) by mouth daily.   midodrine 5 MG tablet Commonly known as: PROAMATINE Take 1 tablet (5 mg total) by mouth  3 (three) times daily with meals. What changed: how much to take   mirtazapine 15 MG tablet Commonly known as: REMERON Take 1 tablet (15 mg total) by mouth once daily at bedtime.   QUEtiapine 100 MG tablet Commonly known as: SEROQUEL Take 1 tablet (100 mg total) by mouth at bedtime.   traZODone 150 MG tablet Commonly known as: DESYREL Take 1 tablet (150 mg total) by mouth at bedtime as  needed for sleep.        No Known Allergies  Consultations: Oncology   Procedures/Studies: No results found.    Subjective: Seen and examined the day of discharge.  Stable no distress.  Agreeable with discharge plan.  Discharge Exam: Vitals:   08/31/21 0447 08/31/21 0757  BP: (!) 127/55 (!) 113/51  Pulse: 87 83  Resp: 19 18  Temp: 98.1 F (36.7 C) 98.2 F (36.8 C)  SpO2: 100% 99%   Vitals:   08/30/21 1935 08/31/21 0447 08/31/21 0500 08/31/21 0757  BP: (!) 116/56 (!) 127/55  (!) 113/51  Pulse: 87 87  83  Resp: 19 19  18   Temp: 98.5 F (36.9 C) 98.1 F (36.7 C)  98.2 F (36.8 C)  TempSrc:      SpO2: 98% 100%  99%  Weight:   63.5 kg   Height:        General: Pt is alert, awake, not in acute distress Cardiovascular: RRR, S1/S2 +, no rubs, no gallops Respiratory: CTA bilaterally, no wheezing, no rhonchi Abdominal: Soft, NT, ND, bowel sounds + Extremities: no edema, no cyanosis    The results of significant diagnostics from this hospitalization (including imaging, microbiology, ancillary and laboratory) are listed below for reference.     Microbiology: No results found for this or any previous visit (from the past 240 hour(s)).   Labs: BNP (last 3 results) No results for input(s): BNP in the last 8760 hours. Basic Metabolic Panel: No results for input(s): NA, K, CL, CO2, GLUCOSE, BUN, CREATININE, CALCIUM, MG, PHOS in the last 168 hours. Liver Function Tests: No results for input(s): AST, ALT, ALKPHOS, BILITOT, PROT, ALBUMIN in the last 168 hours. No results for input(s): LIPASE, AMYLASE in the last 168 hours. No results for input(s): AMMONIA in the last 168 hours. CBC: Recent Labs  Lab 08/25/21 0236  WBC 6.7  HGB 8.0*  HCT 25.7*  MCV 76.0*  PLT 252   Cardiac Enzymes: No results for input(s): CKTOTAL, CKMB, CKMBINDEX, TROPONINI in the last 168 hours. BNP: Invalid input(s): POCBNP CBG: No results for input(s): GLUCAP in the last 168  hours. D-Dimer No results for input(s): DDIMER in the last 72 hours. Hgb A1c No results for input(s): HGBA1C in the last 72 hours. Lipid Profile No results for input(s): CHOL, HDL, LDLCALC, TRIG, CHOLHDL, LDLDIRECT in the last 72 hours. Thyroid function studies No results for input(s): TSH, T4TOTAL, T3FREE, THYROIDAB in the last 72 hours.  Invalid input(s): FREET3 Anemia work up No results for input(s): VITAMINB12, FOLATE, FERRITIN, TIBC, IRON, RETICCTPCT in the last 72 hours. Urinalysis    Component Value Date/Time   COLORURINE YELLOW (A) 06/02/2021 2033   APPEARANCEUR HAZY (A) 06/02/2021 2033   LABSPEC 1.023 06/02/2021 2033   PHURINE 7.0 06/02/2021 2033   GLUCOSEU NEGATIVE 06/02/2021 2033   HGBUR NEGATIVE 06/02/2021 2033   BILIRUBINUR NEGATIVE 06/02/2021 2033   KETONESUR NEGATIVE 06/02/2021 2033   PROTEINUR NEGATIVE 06/02/2021 2033   NITRITE NEGATIVE 06/02/2021 2033   LEUKOCYTESUR NEGATIVE 06/02/2021 2033   Sepsis Labs  Invalid input(s): PROCALCITONIN,  WBC,  LACTICIDVEN Microbiology No results found for this or any previous visit (from the past 240 hour(s)).   Time coordinating discharge: Over 30 minutes  SIGNED:   Sidney Ace, MD  [facility] 08/31/2021, 9:10 AM Pager   If 7PM-7AM, please contact night-coverage

## 2021-08-31 NOTE — Progress Notes (Signed)
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  Pt recently here for multiple months with difficult d/c planning, did ultimatley d/c to homeless shelter and was gone only a few hours before fall with L ankle and wrist fractures with return to the ED.    PT Comments    Patient anticipated to discharge today. Patient declined walking today due to anticipated discharge. Adjusted platform walker for appropriate placement. Patient educated on car transfers and provided with a handout for seated home exercise program. Patient has made progress during his prolonged hospital stay with functional independence but could benefit from continued PT at discharge. Recommend HHPT with intermittent supervision/assistance.    Recommendations for follow up therapy are one component of a multi-disciplinary discharge planning process, led by the attending physician.  Recommendations may be updated based on patient status, additional functional criteria and insurance authorization.  Follow Up Recommendations  Home health PT     Assistance Recommended at Discharge Intermittent Supervision/Assistance  Equipment Recommendations   (rolling walker with platform attachment already in place and adjusted by thearpist for appropriate position)    Recommendations for Other Services       Precautions / Restrictions Precautions Precautions: Fall Precaution Comments: WBAT Restrictions Weight Bearing Restrictions: Yes RUE Weight Bearing: Weight bearing as tolerated LUE Weight Bearing: Weight bearing as tolerated RLE Weight Bearing: Weight bearing as tolerated LLE Weight Bearing: Weight bearing  as tolerated     Mobility  Bed Mobility Overal bed mobility: Modified Independent Bed Mobility: Supine to Sit;Sit to Supine           General bed mobility comments: increased effort required, no physical assistance needed    Transfers                   General transfer comment: patient declined standing due to anticipated d/c later today. rolling walker with platform attachment had been delivered to the room. ajusted with walker with attachment for appropriate height/platform placement. patient was verbally educated on car transfer techniques and he verbalized understanding    Ambulation/Gait                   Stairs             Wheelchair Mobility    Modified Rankin (Stroke Patients Only)       Balance   Sitting-balance support: No upper extremity supported;Feet unsupported Sitting balance-Leahy Scale: Good Sitting balance - Comments: patient able to reach outside base of support with BUE without difficulty and no loss of balance                                    Cognition Arousal/Alertness: Awake/alert Behavior During Therapy: WFL for tasks assessed/performed Overall Cognitive Status: Within Functional Limits for tasks assessed                                          Exercises      General Comments General comments (skin integrity, edema, etc.): patient was provided with  home exercise program for seated level exercises with a handout. patient anticipated to have physical therapy 3 days a week at discharge per his report. educated patient to progress to standing exercise, walking/progression of activity under the supervision of a therapist when discharged.      Pertinent Vitals/Pain Pain Assessment: No/denies pain    Home Living                          Prior Function            PT Goals (current goals can now be found in the care plan section) Acute Rehab PT Goals Patient Stated Goal:  to get stronger PT Goal Formulation: With patient Time For Goal Achievement: 09/10/21 Potential to Achieve Goals: Good Progress towards PT goals: Progressing toward goals    Frequency    Min 2X/week      PT Plan Current plan remains appropriate    Co-evaluation              AM-PAC PT "6 Clicks" Mobility   Outcome Measure  Help needed turning from your back to your side while in a flat bed without using bedrails?: None Help needed moving from lying on your back to sitting on the side of a flat bed without using bedrails?: None Help needed moving to and from a bed to a chair (including a wheelchair)?: A Little Help needed standing up from a chair using your arms (e.g., wheelchair or bedside chair)?: A Little Help needed to walk in hospital room?: A Little Help needed climbing 3-5 steps with a railing? : A Little 6 Click Score: 20    End of Session   Activity Tolerance: Patient tolerated treatment well Patient left: in bed;with nursing/sitter in room Nurse Communication: Mobility status PT Visit Diagnosis: Unsteadiness on feet (R26.81);Muscle weakness (generalized) (M62.81);Difficulty in walking, not elsewhere classified (R26.2);History of falling (Z91.81)     Time: 4888-9169 PT Time Calculation (min) (ACUTE ONLY): 24 min  Charges:  $Therapeutic Activity: 23-37 mins                     Minna Merritts, PT, MPT    Ryan [name] 08/31/2021, 11:07 AM

## 2021-08-31 NOTE — Progress Notes (Signed)
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  Pt recently here for multiple months with difficult d/c planning, did ultimatley d/c to homeless shelter and was gone only a few hours before fall with L ankle and wrist fractures with return to the ED.   OT comments  Today's session focused on preparing [name] for discharge to ALF. He engaged in grooming, bathing, UB and LB dressing, in both sitting and standing, with Mod I-SUPV for all. Participated in therapeutic activities in standing, focusing on increased standing balance tolerance and using L UE whenever possible. Provided educ re: car transfers, HEP, continuation of rehab process, safe use of DME.    Recommendations for follow up therapy are one component of a multi-disciplinary discharge planning process, led by the attending physician.  Recommendations may be updated based on patient status, additional functional criteria and insurance authorization.    Follow Up Recommendations  Home health OT    Assistance Recommended at Discharge Frequent or constant Supervision/Assistance  Equipment Recommendations       Recommendations for Other Services      Precautions / Restrictions Precautions Precautions: None Precaution Comments: WBAT Restrictions Weight Bearing Restrictions: Yes RUE Weight Bearing: Weight bearing as tolerated LUE Weight Bearing: Weight bearing as tolerated RLE Weight Bearing: Weight bearing as tolerated LLE Weight Bearing: Weight bearing as tolerated       Mobility Bed Mobility Overal bed mobility: Modified Independent Bed Mobility: Supine to Sit;Sit to Supine            General bed mobility comments: increased effort required, no physical assistance needed    Transfers Overall transfer level: Needs assistance Equipment used: Rolling walker (2 wheels) Transfers: Sit to/from Stand Sit to Stand: Min assist           General transfer comment: Continues to require Min A for powering up into standing from elevated height     Balance Overall balance assessment: Needs assistance Sitting-balance support: No upper extremity supported;Feet unsupported Sitting balance-Leahy Scale: Good Sitting balance - Comments: patient able to reach outside base of support with BUE without difficulty and no loss of balance Postural control: Left lateral lean Standing balance support: Bilateral upper extremity supported;During functional activity Standing balance-Leahy Scale: Fair Standing balance comment: platform L side on RW                           ADL either performed or assessed with clinical judgement   ADL Overall ADL's : Needs assistance/impaired Eating/Feeding: Independent;Sitting   Grooming: Oral care;Brushing hair;Sitting;Wash/dry face;Wash/dry hands;Supervision/safety Grooming Details (indicate cue type and reason): concentrated on using L UE for ADL performance         Upper Body Dressing : Modified independent;Sitting Upper Body Dressing Details (indicate cue type and reason): pullover and zip-up shirts Lower Body Dressing: Modified independent;Sitting/lateral leans Lower Body Dressing Details (indicate cue type and reason): using bridging in bed to don pants; dons socks, shoes from seated position Toilet Transfer: Modified Independent             General ADL Comments: Mod I/SUPV for LB, UB dressing. VCs and frequent redirection required for attention to tasks  Extremity/Trunk Assessment Upper Extremity Assessment Upper Extremity Assessment: Generalized weakness LUE Deficits / Details: Improved ability to perform digit  opposition. Limited AROM of wrist flex/ext, 2/2 pain, stiffness   Lower Extremity Assessment Lower Extremity Assessment: Generalized weakness   Cervical / Trunk Assessment Cervical / Trunk Assessment: Normal    Vision       Perception     Praxis      Cognition Arousal/Alertness: Awake/alert Behavior During Therapy: WFL for tasks assessed/performed Overall Cognitive Status: Within Functional Limits for tasks assessed                                            Exercises Other Exercises Other Exercises: Educ re: importance of continue to participate in rehab process, car transfer techniques   Shoulder Instructions       General Comments patient was provided with home exercise program for seated level exercises with a handout. patient anticipated to have physical therapy 3 days a week at discharge per his report. educated patient to progress to standing exercise, walking/progression of activity under the supervision of a therapist when discharged.    Pertinent Vitals/ Pain       Pain Assessment: 0-10 Pain Score: 2  Pain Location: L UE Pain Intervention(s): Limited activity within patient's tolerance;Monitored during session;Repositioned  Home Living                                          Prior Functioning/Environment              Frequency  Min 1X/week        Progress Toward Goals  OT Goals(current goals can now be found in the care plan section)  Progress towards OT goals: Progressing toward goals  Acute Rehab OT Goals OT Goal Formulation: With patient Time For Goal Achievement: 09/21/21  Plan Frequency remains appropriate;Discharge plan remains appropriate    Co-evaluation                 AM-PAC OT "6 Clicks" Daily Activity     Outcome Measure   Help from another person eating meals?: None Help from another person taking care of personal grooming?: A Little Help from another person toileting, which  includes using toliet, bedpan, or urinal?: A Little Help from another person bathing (including washing, rinsing, drying)?: A Little Help from another person to put on and taking off regular upper body clothing?: A Little Help from another person to put on and taking off regular lower body clothing?: A Little 6 Click Score: 19    End of Session Equipment Utilized During Treatment: Rolling walker (2 wheels)  OT Visit Diagnosis: Unsteadiness on feet (R26.81);Muscle weakness (generalized) (M62.81);History of falling (Z91.81) Pain - Right/Left: Left Pain - part of body: Arm;Hand   Activity Tolerance Patient tolerated treatment well   Patient Left in bed;with call bell/phone within reach   Nurse Communication          Time: 0930-1020 OT Time Calculation (min): 50 min  Charges: OT General Charges $OT Visit: 1 Visit OT Treatments $Self Care/Home Management : 23-37 mins $Therapeutic Activity: 8-22 mins  Josiah Lobo, PhD, MS, OTR/L 08/31/21, 1:18 PM

## 2021-08-31 NOTE — TOC Progression Note (Signed)
Transition of Care [facility]) - Progression Note    Patient Details  Name: Ryan [name] MRN: [id number] Date of Birth: [birth year]  Transition of [facility]) CM/SW [place], RN Phone Number: 08/31/2021, 9:32 AM  Clinical Narrative:   Sent intake paperwork, FL2 and the FL2 addendum that Nanine Means requires as well as disability paperwork, DSS worker information, TB skin test to [facility] at [place], [place] anticipated DC today         Expected Discharge Plan and Services           Expected Discharge Date: 08/31/21                                     Social Determinants of Health ([place]) Interventions    Readmission Risk Interventions Readmission Risk Prevention Plan 04/23/2021  Transportation Screening Complete  PCP or Specialist Appt within 3-5 Days Complete  HRI or [place] Complete  Social Work Consult for [place] Planning/Counseling Not Complete  SW consult not completed comments RNCM assigned to patient  Palliative Care Screening Complete  Medication Review Press photographer) Complete  Some recent data might be hidden

## 2021-08-31 NOTE — TOC Progression Note (Addendum)
Transition of Care [facility]) - Progression Note    Patient Details  Name: Arber Wiemers MRN: [id number] Date of Birth: 1961-02-10  Transition of Care [facility]) CM/SW [place], RN Phone Number: 08/31/2021, 11:29 AM  Clinical Narrative:    Booked ride thru the ride portal with [employer] [facility] request number [phone] for Door to door    Trula Slade they do not have a door to door driver and they requested that I set up [facility] instead, Nanine Means will assist into the facility and the Hospital Staff will assist into the car,    ride request [phone] for [place] on 08/31/2021 has been received  awaiting a message with Driver inforamtion   Expected Discharge Plan and Services           Expected Discharge Date: 08/31/21                                     Social Determinants of Health ([place]) Interventions    Readmission Risk Interventions Readmission Risk Prevention Plan 04/23/2021  Transportation Screening Complete  PCP or Specialist Appt within 3-5 Days Complete  HRI or [place] Complete  Social Work Consult for [place] Planning/Counseling Not Complete  SW consult not completed comments RNCM assigned to patient  Palliative Care Screening Complete  Medication Review Press photographer) Complete  Some recent data might be hidden

## 2021-08-31 NOTE — Progress Notes (Signed)
Nutrition Follow-up  DOCUMENTATION CODES:   Severe malnutrition in context of chronic illness, Underweight  INTERVENTION:   -Continue liberalized diet of regular -Continue Ensure Enlive po TID, each supplement provides 350 kcal and 20 grams of protein  -Continue double protein portions with meals -Continue Magic cup TID with meals, each supplement provides 290 kcal and 9 grams of protein  -Continue MVI with minerals daily  NUTRITION DIAGNOSIS:   Severe Malnutrition related to chronic illness, cancer and cancer related treatments as evidenced by severe fat depletion, severe muscle depletion.  Ongoing  GOAL:   Patient will meet greater than or equal to 90% of their needs  Progressing   MONITOR:   PO intake, Supplement acceptance, Labs, Weight trends, I & O's  REASON FOR ASSESSMENT:   Malnutrition Screening Tool    ASSESSMENT:   60 yo male with a PMH of prostate cancer metastasized to bone, cancer cachexia, anemia, depression, and anxiety. Recently admitted 6/21-8/24. Discharged to homeless shelter then returned to ED a few hours later after a fall and pain to his wrist.  Reviewed I/O's: -1.7 L x 24 hours and -16.7 L since 08/17/21  UOP: 1.7 L x 24 hours  Pt's intake has improved since last visit. Noted meal completions 50-100%. Pt is inconsistent with consuming Ensure Enlive supplements.    Reviewed wt hx; wt has been stable over the past month.  Per TOC notes, anticipated discharge to ALF today.    Medications reviewed and include calcium carbonate, magnesium oxide, and remeron.   Labs reviewed: Na: 133.    Diet Order:   Diet Order             Diet - low sodium heart healthy           Diet regular Room service appropriate? Yes; Fluid consistency: Thin  Diet effective now                   EDUCATION NEEDS:   Education needs have been addressed  Skin:  Skin Assessment: Reviewed RN Assessment  Last BM:  08/30/21  Height:   Ht Readings from Last  1 Encounters:  07/06/21 6\' 5"  (1.956 m)    Weight:   Wt Readings from Last 1 Encounters:  08/31/21 63.5 kg    Ideal Body Weight:  94.5 kg  BMI:  Body mass index is 16.6 kg/m.  Estimated Nutritional Needs:   Kcal:  2400-2600  Protein:  130-150 grams  Fluid:  >2.4 L    Loistine Chance, RD, LDN, [place] Registered Dietitian II Certified Diabetes Care and Education Specialist Please refer to [facility] for RD and/or RD on-call/weekend/after hours pager

## 2021-09-01 ENCOUNTER — Telehealth: Payer: Self-pay | Admitting: Pharmacist

## 2021-09-01 NOTE — Telephone Encounter (Signed)
Oral Chemotherapy Pharmacist Encounter   [name] order was sent to [facility] on 08/31/21, the pharmacy used by [facility]. [place] to see if they were able to process prescription. Spoke with Ivery Quale at the pharmacy and she will look on processing the prescription, then give me a call back.   Darl Pikes, PharmD, BCPS, BCOP, CPP Hematology/Oncology Clinical Pharmacist [facility]/AP Oral [facility] [phone]  09/01/2021 10:11 AM

## 2021-09-06 NOTE — Telephone Encounter (Signed)
Oral Chemotherapy Pharmacist Encounter   Did not hear back from [facility]. Called them today to check on the status of the Xtandi. They were able to get the [facility] processed through the [facility] for Mr. Michel on 11/23, but have sent the medication to the facility.   They special ordered the medication but due to the holiday they have not received it. They checked on the status, and reprt the medication will be in by tomorrow 09/07/21. Medication should be delivered to [facility] for Mr. Rodriguez on 09/08/21 or 09/09/21.   Will f/u with [facility] later in the week to make sure Mr. Schellenberg is receiving the Xtandi.   Darl Pikes, PharmD, BCPS, BCOP, CPP Hematology/Oncology Clinical Pharmacist [facility]/DB/AP [facility] [phone]  09/06/2021 11:55 AM

## 2021-09-10 NOTE — Telephone Encounter (Signed)
Oral Chemotherapy Pharmacist Encounter   Did not hear back from [place] this week, so I reached back out today and had to LVM.   [place]  (patient's [facility]) and spoke with nurse Cecille Rubin who is taking care of Ryan [name]. She stated that they were able to start the Xtandi on 09/01/21, the day after his arrival at [place], using the home med supply he was discharged with. She also reported that since then they have started receiving the medication from [facility].   No further f/u on Xtandi access needed at this time.   Darl Pikes, PharmD, BCPS, BCOP, CPP Hematology/Oncology Clinical Pharmacist [facility]/AP [facility] [phone]  09/10/2021 10:29 AM

## 2021-09-13 ENCOUNTER — Telehealth: Payer: Self-pay | Admitting: *Deleted

## 2021-09-13 NOTE — Telephone Encounter (Signed)
Lorie from [facility] called reporting that a medicine error was made regarding patient Ryan [name] yesterday. Their pharmacy sent 80 mg tabs and our order was for 40 mg tabs to take 4 tabs (160 mg) and instead he was given 4 80 mg tabs (320 mg) The error has been caught and correct dose given today, but wanted doctor to be aware of error

## 2021-09-23 ENCOUNTER — Other Ambulatory Visit: Payer: Self-pay | Admitting: Oncology

## 2021-09-23 DIAGNOSIS — C7951 Secondary malignant neoplasm of bone: Secondary | ICD-10-CM

## 2021-09-27 ENCOUNTER — Telehealth: Payer: Self-pay | Admitting: Oncology

## 2021-09-27 NOTE — Telephone Encounter (Signed)
Scheduled appt per 12/15 referral. Spoke to pt's aunt who told me he was in residence at [facility]. I called [facility] and scheduled appt for pt. They said they would bring him to his appt.

## 2021-09-30 ENCOUNTER — Inpatient Hospital Stay: Payer: Medicaid Other | Attending: Oncology | Admitting: Oncology

## 2021-10-07 ENCOUNTER — Telehealth: Payer: Self-pay | Admitting: Oncology

## 2021-10-07 NOTE — Telephone Encounter (Signed)
R/s pt's missed appt with Dr. Alen Blew. Spoke to [employer] at [place] so sch pt's appt. She said she will make sure pt is at new appt date and time and will have him there 15 mins prior to appt.

## 2021-10-21 ENCOUNTER — Inpatient Hospital Stay: Payer: Medicaid Other | Attending: Oncology | Admitting: Oncology

## 2021-10-21 ENCOUNTER — Other Ambulatory Visit: Payer: Self-pay

## 2021-10-21 DIAGNOSIS — C61 Malignant neoplasm of prostate: Secondary | ICD-10-CM | POA: Diagnosis not present

## 2021-10-21 NOTE — Progress Notes (Signed)
Reason for the request:    Prostate cancer  HPI: I was asked by Dr. Grayland [name] to evaluate Ryan [name] for the evaluation of prostate cancer.  He is a 61 year old gentleman who does not seek routine medical care hospitalized in June 2022 with proximal femur intertrochanteric fracture after a sustained a fall.  His work-up at that time was found to have 35 pound weight loss compression fracture.  His evaluation revealed widespread cancer consistent with prostate primary and a PSA of over 3000.  He was started on androgen deprivation therapy initially with a PSA dropping to 184 in September 2022.  His PSA started to rise again up to 1199 in October 2022.  He remained hospitalized between September and November 2022 due to health issues and housing issues.  He was started on Xtandi under the care of Dr. Grayland [name] during his hospitalization at [facility]  His PSA declined to 921 on August 27, 2021.  He was discharged to a skilled nursing facility where he currently resides.  He is here to establish care.  Since his discharge, he reports continuous improvement in his overall health.  He has gained more weight and has not reported any increase bone pain.  He is ambulating with the help of a walker and continues participate in physical therapy.   He does not report any headaches, blurry vision, syncope or seizures. Does not report any fevers, chills or sweats.  Does not report any cough, wheezing or hemoptysis.  Does not report any chest pain, palpitation, orthopnea or leg edema.  Does not report any nausea, vomiting or abdominal pain.  Does not report any constipation or diarrhea.  Does not report any skeletal complaints.    Does not report frequency, urgency or hematuria.  Does not report any skin rashes or lesions. Does not report any heat or cold intolerance.  Does not report any lymphadenopathy or petechiae.  Does not report any anxiety or depression.  Remaining review of systems is negative.      Past Medical History:  Diagnosis Date   Adenomatous colon polyp    Anemia    Depression    Elevated LFTs   :   Past Surgical History:  Procedure Laterality Date   FRACTURE SURGERY     INTRAMEDULLARY (IM) NAIL INTERTROCHANTERIC Right 04/02/2020   Procedure: INTRAMEDULLARY (IM) NAIL INTERTROCHANTRIC;  Surgeon: Ryan Mull, MD;  Location: [facility] ORS;  Service: Orthopedics;  Laterality: Right;  :   Current Outpatient Medications:    enzalutamide (XTANDI) 40 MG tablet, Take 4 tablets (160 mg total) by mouth daily., Disp: 120 tablet, Rfl: 0   feeding supplement (ENSURE ENLIVE / ENSURE PLUS) LIQD, Take 237 mLs by mouth 3 (three) times daily between meals., Disp: 237 mL, Rfl: 12   hydrOXYzine (ATARAX/VISTARIL) 25 MG tablet, Take 1 tablet (25 mg total) by mouth 3 (three) times daily as needed for anxiety., Disp: 30 tablet, Rfl: 0   magnesium oxide (MAG-OX) 400 (240 Mg) MG tablet, Take 1 tablet (400 mg total) by mouth daily., Disp: , Rfl:    midodrine (PROAMATINE) 5 MG tablet, Take 1 tablet (5 mg total) by mouth 3 (three) times daily with meals., Disp: , Rfl:    mirtazapine (REMERON) 15 MG tablet, Take 1 tablet (15 mg total) by mouth once daily at bedtime., Disp: 15 tablet, Rfl: 0   QUEtiapine (SEROQUEL) 100 MG tablet, Take 1 tablet (100 mg total) by mouth at bedtime., Disp: , Rfl:    traZODone (DESYREL) 150  MG tablet, Take 1 tablet (150 mg total) by mouth at bedtime as needed for sleep., Disp: , Rfl: :  No Known Allergies:   Family History  Problem Relation Age of Onset   Heart disease Other    Colon polyps Cousin        maternal   Colon polyps Cousin    Colon polyps Maternal Aunt    Colon cancer Maternal Grandfather   :   Social History   Socioeconomic History   Marital status: Single    Spouse name: Not on file   Number of children: Not on file   Years of education: Not on file   Highest education level: Not on file  Occupational History   Not on file  Tobacco Use    Smoking status: Some Days   Smokeless tobacco: Never  Substance and Sexual Activity   Alcohol use: Yes    Comment: 4 beers weekly   Drug use: No   Sexual activity: Not on file  Other Topics Concern   Not on file  Social History Narrative   Not on file   Social Determinants of Health   Financial Resource Strain: Not on file  Food Insecurity: Not on file  Transportation Needs: Not on file  Physical Activity: Not on file  Stress: Not on file  Social Connections: Not on file  Intimate Partner Violence: Not on file  :  Pertinent items are noted in HPI. Blood pressure (!) 99/57, pulse 90, temperature 98 F (36.7 C), temperature source Temporal, resp. rate 17, height 6\' 5"  (1.956 m), weight 131 lb 3.2 oz (59.5 kg), SpO2 100 %. ECOG 1 Exam:  General appearance: alert and cooperative appeared without distress. Head: atraumatic without any abnormalities. Eyes: conjunctivae/corneas clear. PERRL.  Sclera anicteric. Throat: lips, mucosa, and tongue normal; without oral thrush or ulcers. Resp: clear to auscultation bilaterally without rhonchi, wheezes or dullness to percussion. Cardio: regular rate and rhythm, S1, S2 normal, no murmur, click, rub or gallop GI: soft, non-tender; bowel sounds normal; no masses,  no organomegaly Skin: Skin color, texture, turgor normal. No rashes or lesions Lymph nodes: Cervical, supraclavicular, and axillary nodes normal. Neurologic: Grossly normal without any motor, sensory or deep tendon reflexes. Musculoskeletal: No joint deformity or effusion.   Assessment and Plan:   62 year old with:  1.  Castration-resistant advanced prostate cancer with disease to the bone diagnosed in June 2022.  His PSA was over 30,000.  He was treated with androgen deprivation therapy alone and subsequently developed castration-resistant disease and Ryan [name] has been added.  He did experience a decline in his PSA currently on Xtandi in November 2022.   The natural course  of this disease was reviewed at this time and treatment choices were discussed.  He does represent a rather aggressive malignancy at this time with widespread disease and very high PSA.  He is currently responding to [facility] although this likely will be short-lived and additional therapy may be required.  Taxotere chemotherapy with be his best next option at this time.  We will monitor his PSA and reinstitute that in the near future.  Complications include nausea, vomiting, myelosuppression were reiterated.  We will update his laboratory testing including his PSA and determine best course of action accordingly.  2.  Androgen deprivation therapy: This will be continued indefinitely.  He received Lupron 22.5 mg on July 02, 2021 and this will be repeated next week.  He will receive Eligard 30 mg every 4 months.   3.  Bone directed therapy: He received Zometa on July 30, 2021.  This will be repeated next week.  He will receive Zometa every 8 weeks.  4.  Follow-up: We will be in 1 week to receive Eligard and Zometa and MD follow-up in 8 weeks.  60  minutes were dedicated to this visit. The time was spent on reviewing laboratory data, imaging studies, discussing treatment options, and answering questions regarding future plan.    A copy of this consult has been forwarded to the requesting physician.

## 2021-10-21 NOTE — Progress Notes (Signed)
Introduced myself to the patient as the prostate nurse navigator.  Denies any  barriers to care identified at this time, he currently is residing in a skilled nursing facility. I gave him my business card, along with extras to give staff at SNF, and asked him to call me with questions or concerns.  Verbalized understanding.

## 2021-10-26 ENCOUNTER — Telehealth: Payer: Self-pay | Admitting: Oncology

## 2021-10-26 NOTE — Telephone Encounter (Signed)
Scheduled per 01/12 los, patient has been called and voicemail was left. °

## 2021-10-28 ENCOUNTER — Inpatient Hospital Stay: Payer: Medicaid Other

## 2021-11-05 ENCOUNTER — Telehealth: Payer: Self-pay | Admitting: Oncology

## 2021-11-05 NOTE — Telephone Encounter (Signed)
Scheduled per 01/12 los, patient has been called and notified. °

## 2021-11-12 ENCOUNTER — Telehealth: Payer: Self-pay | Admitting: Pharmacist

## 2021-11-12 NOTE — Telephone Encounter (Signed)
Patient failed to provide requested 2023 financial documentation. No additional medication assistance will be provided by [facility] without the required proof of income documentation. Patient notified by letter.  [facility]

## 2021-11-16 ENCOUNTER — Telehealth: Payer: Self-pay | Admitting: Pharmacy Technician

## 2021-11-16 NOTE — Telephone Encounter (Signed)
°  [address], [place]  [address]  November 12, 2021    Dear Ryan [name]:  This is to inform you that you are no longer eligible to receive medication assistance at [facility].  The reason(s) are:    _____Your total gross monthly household income exceeds 300% of the Federal Poverty [name].   _____Tangible assets (savings, checking, stocks/bonds, pension, retirement, etc.) exceeds our limit  _____You are eligible to receive benefits from [facility], [facility] or HIV Medication              Assistance Program _____You are eligible to receive benefits from a Medicare Part D plan _____You have prescription insurance  _____You are not an [facility] resident __X__Failure to provide all requested documentation (proof of income for 2023, and/or Patient Intake Application, DOH Attestation, Contract, etc).    Medication assistance will resume once all requested documentation has been returned to our clinic.  If you have questions, please contact our clinic at [phone].    Thank you,  [facility]

## 2021-11-17 ENCOUNTER — Other Ambulatory Visit: Payer: Self-pay

## 2021-12-09 ENCOUNTER — Telehealth: Payer: Self-pay | Admitting: *Deleted

## 2021-12-09 ENCOUNTER — Other Ambulatory Visit: Payer: Self-pay | Admitting: *Deleted

## 2021-12-09 DIAGNOSIS — C61 Malignant neoplasm of prostate: Secondary | ICD-10-CM

## 2021-12-09 DIAGNOSIS — C7951 Secondary malignant neoplasm of bone: Secondary | ICD-10-CM

## 2021-12-09 MED ORDER — ENZALUTAMIDE 40 MG PO TABS: 160.0000 mg | ORAL_TABLET | Freq: Every day | ORAL | 0 refills | Status: DC

## 2021-12-09 NOTE — Telephone Encounter (Signed)
I returned call to [place] and left voice mail that she needs to contact Dr Hazeline Junker office for appointment and refill  ?

## 2021-12-09 NOTE — Telephone Encounter (Signed)
Pt POA called requesting refill for Xantdi. [facility] called POA to inform that pt had ran out of the medication. VM was left on POA personal number list to make aware that there was a refill placed to [facility].  ?

## 2021-12-09 NOTE — Telephone Encounter (Signed)
Patient family called asking about patient cancer medicine Xtandi. Looks like he was to see Dr Alen Blew in [place], but was a No Show and he has no follow up with Dr Grayland Ormond either. Please advise ?

## 2021-12-22 ENCOUNTER — Encounter (HOSPITAL_COMMUNITY): Payer: Self-pay | Admitting: Emergency Medicine

## 2021-12-22 ENCOUNTER — Inpatient Hospital Stay (HOSPITAL_COMMUNITY): Payer: Medicaid Other

## 2021-12-22 ENCOUNTER — Inpatient Hospital Stay (HOSPITAL_COMMUNITY)
Admission: EM | Admit: 2021-12-22 | Discharge: 2021-12-25 | DRG: 722 | Disposition: A | Discharge status: Hospice | Payer: Medicaid Other | Attending: Family Medicine | Admitting: Family Medicine

## 2021-12-22 ENCOUNTER — Emergency Department (HOSPITAL_COMMUNITY): Payer: Medicaid Other

## 2021-12-22 ENCOUNTER — Other Ambulatory Visit: Payer: Self-pay

## 2021-12-22 DIAGNOSIS — Z20822 Contact with and (suspected) exposure to covid-19: Secondary | ICD-10-CM | POA: Diagnosis present

## 2021-12-22 DIAGNOSIS — F1090 Alcohol use, unspecified, uncomplicated: Secondary | ICD-10-CM | POA: Diagnosis present

## 2021-12-22 DIAGNOSIS — D649 Anemia, unspecified: Secondary | ICD-10-CM | POA: Diagnosis present

## 2021-12-22 DIAGNOSIS — R64 Cachexia: Secondary | ICD-10-CM | POA: Diagnosis present

## 2021-12-22 DIAGNOSIS — Z86010 Personal history of colonic polyps: Secondary | ICD-10-CM | POA: Diagnosis not present

## 2021-12-22 DIAGNOSIS — E43 Unspecified severe protein-calorie malnutrition: Secondary | ICD-10-CM | POA: Diagnosis present

## 2021-12-22 DIAGNOSIS — D630 Anemia in neoplastic disease: Secondary | ICD-10-CM | POA: Diagnosis present

## 2021-12-22 DIAGNOSIS — Z800 Family history of malignant neoplasm of digestive organs: Secondary | ICD-10-CM

## 2021-12-22 DIAGNOSIS — R531 Weakness: Secondary | ICD-10-CM

## 2021-12-22 DIAGNOSIS — E860 Dehydration: Secondary | ICD-10-CM | POA: Diagnosis present

## 2021-12-22 DIAGNOSIS — E872 Acidosis, unspecified: Secondary | ICD-10-CM | POA: Diagnosis present

## 2021-12-22 DIAGNOSIS — Z7189 Other specified counseling: Secondary | ICD-10-CM | POA: Diagnosis not present

## 2021-12-22 DIAGNOSIS — I10 Essential (primary) hypertension: Secondary | ICD-10-CM | POA: Diagnosis present

## 2021-12-22 DIAGNOSIS — R6510 Systemic inflammatory response syndrome (SIRS) of non-infectious origin without acute organ dysfunction: Secondary | ICD-10-CM

## 2021-12-22 DIAGNOSIS — F17200 Nicotine dependence, unspecified, uncomplicated: Secondary | ICD-10-CM | POA: Diagnosis present

## 2021-12-22 DIAGNOSIS — D6481 Anemia due to antineoplastic chemotherapy: Secondary | ICD-10-CM | POA: Diagnosis present

## 2021-12-22 DIAGNOSIS — W19XXXA Unspecified fall, initial encounter: Secondary | ICD-10-CM

## 2021-12-22 DIAGNOSIS — Z681 Body mass index (BMI) 19 or less, adult: Secondary | ICD-10-CM

## 2021-12-22 DIAGNOSIS — G8929 Other chronic pain: Secondary | ICD-10-CM | POA: Diagnosis present

## 2021-12-22 DIAGNOSIS — Z515 Encounter for palliative care: Secondary | ICD-10-CM

## 2021-12-22 DIAGNOSIS — R627 Adult failure to thrive: Secondary | ICD-10-CM | POA: Diagnosis present

## 2021-12-22 DIAGNOSIS — I951 Orthostatic hypotension: Secondary | ICD-10-CM | POA: Diagnosis present

## 2021-12-22 DIAGNOSIS — F419 Anxiety disorder, unspecified: Secondary | ICD-10-CM | POA: Diagnosis present

## 2021-12-22 DIAGNOSIS — I959 Hypotension, unspecified: Secondary | ICD-10-CM | POA: Diagnosis present

## 2021-12-22 DIAGNOSIS — Z79899 Other long term (current) drug therapy: Secondary | ICD-10-CM

## 2021-12-22 DIAGNOSIS — C7951 Secondary malignant neoplasm of bone: Secondary | ICD-10-CM | POA: Diagnosis present

## 2021-12-22 DIAGNOSIS — N179 Acute kidney failure, unspecified: Secondary | ICD-10-CM | POA: Diagnosis present

## 2021-12-22 DIAGNOSIS — Z66 Do not resuscitate: Secondary | ICD-10-CM | POA: Diagnosis present

## 2021-12-22 DIAGNOSIS — R197 Diarrhea, unspecified: Secondary | ICD-10-CM | POA: Diagnosis not present

## 2021-12-22 DIAGNOSIS — E861 Hypovolemia: Principal | ICD-10-CM | POA: Diagnosis present

## 2021-12-22 DIAGNOSIS — F32A Depression, unspecified: Secondary | ICD-10-CM

## 2021-12-22 DIAGNOSIS — E871 Hypo-osmolality and hyponatremia: Secondary | ICD-10-CM | POA: Diagnosis present

## 2021-12-22 DIAGNOSIS — R54 Age-related physical debility: Secondary | ICD-10-CM | POA: Diagnosis present

## 2021-12-22 DIAGNOSIS — T451X5A Adverse effect of antineoplastic and immunosuppressive drugs, initial encounter: Secondary | ICD-10-CM | POA: Diagnosis present

## 2021-12-22 DIAGNOSIS — C61 Malignant neoplasm of prostate: Secondary | ICD-10-CM | POA: Diagnosis present

## 2021-12-22 DIAGNOSIS — R000 Tachycardia, unspecified: Secondary | ICD-10-CM | POA: Diagnosis present

## 2021-12-22 DIAGNOSIS — Z8371 Family history of colonic polyps: Secondary | ICD-10-CM

## 2021-12-22 DIAGNOSIS — Z7901 Long term (current) use of anticoagulants: Secondary | ICD-10-CM

## 2021-12-22 LAB — COMPREHENSIVE METABOLIC PANEL
ALT: 10 U/L (ref 0–44)
AST: 101 U/L — ABNORMAL HIGH (ref 15–41)
Albumin: 3.1 g/dL — ABNORMAL LOW (ref 3.5–5.0)
Alkaline Phosphatase: 576 U/L — ABNORMAL HIGH (ref 38–126)
Anion gap: 13 (ref 5–15)
BUN: 30 mg/dL — ABNORMAL HIGH (ref 6–20)
CO2: 19 mmol/L — ABNORMAL LOW (ref 22–32)
Calcium: 8.7 mg/dL — ABNORMAL LOW (ref 8.9–10.3)
Chloride: 99 mmol/L (ref 98–111)
Creatinine, Ser: 1.81 mg/dL — ABNORMAL HIGH (ref 0.61–1.24)
GFR, Estimated: 42 mL/min — ABNORMAL LOW (ref >60)
Glucose, Bld: 91 mg/dL (ref 70–99)
Potassium: 4.8 mmol/L (ref 3.5–5.1)
Sodium: 131 mmol/L — ABNORMAL LOW (ref 135–145)
Total Bilirubin: 1.8 mg/dL — ABNORMAL HIGH (ref 0.3–1.2)
Total Protein: 7.5 g/dL (ref 6.5–8.1)

## 2021-12-22 LAB — URINALYSIS, MICROSCOPIC (REFLEX)

## 2021-12-22 LAB — IRON AND TIBC
Iron: 17 ug/dL — ABNORMAL LOW (ref 45–182)
Saturation Ratios: 6 % — ABNORMAL LOW (ref 17.9–39.5)
TIBC: 269 ug/dL (ref 250–450)
UIBC: 252 ug/dL

## 2021-12-22 LAB — CBC WITH DIFFERENTIAL/PLATELET
Abs Immature Granulocytes: 0.18 10*3/uL — ABNORMAL HIGH (ref 0.00–0.07)
Basophils Absolute: 0 10*3/uL (ref 0.0–0.1)
Basophils Relative: 0 %
Eosinophils Absolute: 0 10*3/uL (ref 0.0–0.5)
Eosinophils Relative: 0 %
HCT: 20.8 % — ABNORMAL LOW (ref 39.0–52.0)
Hemoglobin: 6.5 g/dL — CL (ref 13.0–17.0)
Immature Granulocytes: 2 %
Lymphocytes Relative: 16 %
Lymphs Abs: 1.9 10*3/uL (ref 0.7–4.0)
MCH: 22 pg — ABNORMAL LOW (ref 26.0–34.0)
MCHC: 31.3 g/dL (ref 30.0–36.0)
MCV: 70.5 fL — ABNORMAL LOW (ref 80.0–100.0)
Monocytes Absolute: 1.2 10*3/uL — ABNORMAL HIGH (ref 0.1–1.0)
Monocytes Relative: 10 %
Neutro Abs: 8.6 10*3/uL — ABNORMAL HIGH (ref 1.7–7.7)
Neutrophils Relative %: 72 %
Platelets: 327 10*3/uL (ref 150–400)
RBC: 2.95 MIL/uL — ABNORMAL LOW (ref 4.22–5.81)
RDW: 17.8 % — ABNORMAL HIGH (ref 11.5–15.5)
WBC: 12 10*3/uL — ABNORMAL HIGH (ref 4.0–10.5)
nRBC: 0 % (ref 0.0–0.2)

## 2021-12-22 LAB — URINALYSIS, ROUTINE W REFLEX MICROSCOPIC
Bilirubin Urine: NEGATIVE
Glucose, UA: NEGATIVE mg/dL
Hgb urine dipstick: NEGATIVE
Ketones, ur: NEGATIVE mg/dL
Leukocytes,Ua: NEGATIVE
Nitrite: NEGATIVE
Specific Gravity, Urine: <1.005 — ABNORMAL LOW (ref 1.005–1.030)
pH: 7 (ref 5.0–8.0)

## 2021-12-22 LAB — RETICULOCYTES
Immature Retic Fract: 26 % — ABNORMAL HIGH (ref 2.3–15.9)
RBC.: 3.05 MIL/uL — ABNORMAL LOW (ref 4.22–5.81)
Retic Count, Absolute: 98.2 10*3/uL (ref 19.0–186.0)
Retic Ct Pct: 3.2 % — ABNORMAL HIGH (ref 0.4–3.1)

## 2021-12-22 LAB — LACTIC ACID, PLASMA
Lactic Acid, Venous: 3.1 mmol/L (ref 0.5–1.9)
Lactic Acid, Venous: 2.5 mmol/L (ref 0.5–1.9)
Lactic Acid, Venous: 1.9 mmol/L (ref 0.5–1.9)

## 2021-12-22 LAB — APTT: aPTT: 54 seconds — ABNORMAL HIGH (ref 24–36)

## 2021-12-22 LAB — FOLATE: Folate: 12.4 ng/mL (ref >5.9)

## 2021-12-22 LAB — PROTIME-INR
INR: 1.7 — ABNORMAL HIGH (ref 0.8–1.2)
Prothrombin Time: 20.1 seconds — ABNORMAL HIGH (ref 11.4–15.2)

## 2021-12-22 LAB — RESP PANEL BY RT-PCR (FLU A&B, COVID) ARPGX2
Influenza A by PCR: NEGATIVE
Influenza B by PCR: NEGATIVE
SARS Coronavirus 2 by RT PCR: NEGATIVE

## 2021-12-22 LAB — MRSA NEXT GEN BY PCR, NASAL: MRSA by PCR Next Gen: NOT DETECTED

## 2021-12-22 LAB — PREPARE RBC (CROSSMATCH)

## 2021-12-22 LAB — HEMOGLOBIN AND HEMATOCRIT, BLOOD
HCT: 20.2 % — ABNORMAL LOW (ref 39.0–52.0)
Hemoglobin: 6.3 g/dL — CL (ref 13.0–17.0)

## 2021-12-22 LAB — VITAMIN B12: Vitamin B-12: 807 pg/mL (ref 180–914)

## 2021-12-22 LAB — FERRITIN: Ferritin: 2569 ng/mL — ABNORMAL HIGH (ref 24–336)

## 2021-12-22 MED ORDER — LACTATED RINGERS IV BOLUS (SEPSIS)
1000.0000 mL | Freq: Once | INTRAVENOUS | Status: AC
  Administered 2021-12-22: 1000 mL via INTRAVENOUS

## 2021-12-22 MED ORDER — VANCOMYCIN HCL IN DEXTROSE 1-5 GM/200ML-% IV SOLN
1000.0000 mg | Freq: Once | INTRAVENOUS | Status: AC
  Administered 2021-12-22: 1000 mg via INTRAVENOUS
  Filled 2021-12-22: qty 200

## 2021-12-22 MED ORDER — VENLAFAXINE HCL ER 37.5 MG PO CP24
37.5000 mg | ORAL_CAPSULE | Freq: Every day | ORAL | Status: DC
  Administered 2021-12-23 – 2021-12-24 (×2): 37.5 mg via ORAL
  Filled 2021-12-22 (×2): qty 1

## 2021-12-22 MED ORDER — VANCOMYCIN HCL IN DEXTROSE 1-5 GM/200ML-% IV SOLN: 1000.0000 mg | Freq: Once | INTRAVENOUS | Status: DC

## 2021-12-22 MED ORDER — ACETAMINOPHEN 325 MG PO TABS
650.0000 mg | ORAL_TABLET | Freq: Four times a day (QID) | ORAL | Status: DC | PRN
  Administered 2021-12-23: 650 mg via ORAL
  Filled 2021-12-22: qty 2

## 2021-12-22 MED ORDER — SODIUM CHLORIDE 0.9 % IV SOLN
2.0000 g | Freq: Two times a day (BID) | INTRAVENOUS | Status: DC
  Filled 2021-12-22: qty 2

## 2021-12-22 MED ORDER — ENZALUTAMIDE 80 MG PO TABS: 160.0000 mg | ORAL_TABLET | Freq: Every day | ORAL | Status: DC

## 2021-12-22 MED ORDER — TRAMADOL HCL 50 MG PO TABS
50.0000 mg | ORAL_TABLET | Freq: Four times a day (QID) | ORAL | Status: DC | PRN
  Administered 2021-12-22 – 2021-12-24 (×5): 50 mg via ORAL
  Filled 2021-12-22 (×5): qty 1

## 2021-12-22 MED ORDER — ACETAMINOPHEN 650 MG RE SUPP: 650.0000 mg | Freq: Four times a day (QID) | RECTAL | Status: DC | PRN

## 2021-12-22 MED ORDER — SENNOSIDES-DOCUSATE SODIUM 8.6-50 MG PO TABS: 1.0000 | ORAL_TABLET | Freq: Every day | ORAL | Status: DC | PRN

## 2021-12-22 MED ORDER — ADULT MULTIVITAMIN W/MINERALS CH
1.0000 | ORAL_TABLET | Freq: Every day | ORAL | Status: DC
  Administered 2021-12-23: 1 via ORAL
  Filled 2021-12-22: qty 1

## 2021-12-22 MED ORDER — ONDANSETRON HCL 4 MG PO TABS: 4.0000 mg | ORAL_TABLET | Freq: Four times a day (QID) | ORAL | Status: DC | PRN

## 2021-12-22 MED ORDER — METRONIDAZOLE 500 MG/100ML IV SOLN
500.0000 mg | Freq: Two times a day (BID) | INTRAVENOUS | Status: DC
  Administered 2021-12-22 – 2021-12-23 (×2): 500 mg via INTRAVENOUS
  Filled 2021-12-22 (×2): qty 100

## 2021-12-22 MED ORDER — LACTATED RINGERS IV BOLUS
1000.0000 mL | Freq: Once | INTRAVENOUS | Status: AC
  Administered 2021-12-22: 1000 mL via INTRAVENOUS

## 2021-12-22 MED ORDER — MIRTAZAPINE 15 MG PO TABS
15.0000 mg | ORAL_TABLET | Freq: Every day | ORAL | Status: DC
  Administered 2021-12-22 – 2021-12-23 (×2): 15 mg via ORAL
  Filled 2021-12-22 (×2): qty 1

## 2021-12-22 MED ORDER — MIDODRINE HCL 5 MG PO TABS
5.0000 mg | ORAL_TABLET | Freq: Three times a day (TID) | ORAL | Status: DC
  Administered 2021-12-22 – 2021-12-23 (×3): 5 mg via ORAL
  Filled 2021-12-22 (×3): qty 1

## 2021-12-22 MED ORDER — LACTATED RINGERS IV SOLN: INTRAVENOUS | Status: DC

## 2021-12-22 MED ORDER — CHLORHEXIDINE GLUCONATE CLOTH 2 % EX PADS
6.0000 | MEDICATED_PAD | Freq: Every day | CUTANEOUS | Status: DC
  Administered 2021-12-22 – 2021-12-24 (×2): 6 via TOPICAL

## 2021-12-22 MED ORDER — SODIUM CHLORIDE 0.9 % IV SOLN: 2.0000 g | Freq: Once | INTRAVENOUS | Status: DC

## 2021-12-22 MED ORDER — HYDROXYZINE HCL 25 MG PO TABS: 25.0000 mg | ORAL_TABLET | Freq: Three times a day (TID) | ORAL | Status: DC | PRN

## 2021-12-22 MED ORDER — ONDANSETRON HCL 4 MG PO TABS: 4.0000 mg | ORAL_TABLET | Freq: Three times a day (TID) | ORAL | Status: DC | PRN

## 2021-12-22 MED ORDER — ONDANSETRON HCL 4 MG/2ML IJ SOLN: 4.0000 mg | Freq: Four times a day (QID) | INTRAMUSCULAR | Status: DC | PRN

## 2021-12-22 MED ORDER — SODIUM CHLORIDE 0.9 % IV SOLN
2.0000 g | Freq: Two times a day (BID) | INTRAVENOUS | Status: DC
  Administered 2021-12-22 – 2021-12-23 (×2): 2 g via INTRAVENOUS
  Filled 2021-12-22 (×2): qty 2

## 2021-12-22 MED ORDER — VANCOMYCIN HCL 750 MG/150ML IV SOLN: 750.0000 mg | INTRAVENOUS | Status: DC

## 2021-12-22 MED ORDER — TRAZODONE HCL 50 MG PO TABS
50.0000 mg | ORAL_TABLET | Freq: Every day | ORAL | Status: DC
  Administered 2021-12-22 – 2021-12-23 (×2): 50 mg via ORAL
  Filled 2021-12-22 (×2): qty 1

## 2021-12-22 MED ORDER — SODIUM CHLORIDE 0.9% IV SOLUTION: Freq: Once | INTRAVENOUS | Status: DC

## 2021-12-22 MED ORDER — SODIUM CHLORIDE 0.9% IV SOLUTION: Freq: Once | INTRAVENOUS | Status: AC

## 2021-12-22 NOTE — ED Provider Notes (Signed)
?[place] DEPT ?Provider Note ? ? ?CSN: [id number] ?Arrival date & time: 12/22/21  1343 ? ?  ? ?History ? ?Chief Complaint  ?Patient presents with  ? Hypotension  ? ? ?Ryan [name] is a 61 y.o. male. ? ?61 yo M with chief complaint of fatigue.  This been going on for couple days.  Tells me he has not really been eating because he does not feel like it.  As well as loss of appetite.  He unfortunately has a history of prostate cancer with mets and is living in a skilled nursing facility.  He denies any cough congestion or fever denies any urinary symptoms.  Has chronic back pain at baseline but denies any worsening.  He was picked up by EMS and found to be tachycardic into the 140s and mildly tachypneic.  Was brought here for evaluation. ? ? ? ?  ? ?Home Medications ?Prior to Admission medications   ?Medication Sig Start Date End Date Taking? Authorizing Provider  ?acetaminophen (TYLENOL) 500 MG tablet Take 1,000 mg by mouth every 8 (eight) hours as needed for mild pain.   Yes [provider]  ?calcium carbonate (OS-CAL - DOSED IN MG OF ELEMENTAL CALCIUM) 1250 (500 Ca) MG tablet Take 1,250 mg by mouth in the morning, at noon, and at bedtime.   Yes [provider]  ?enzalutamide (XTANDI) 80 MG tablet Take 160 mg by mouth daily.   Yes [provider]  ?hydrOXYzine (VISTARIL) 25 MG capsule Take 25 mg by mouth every 8 (eight) hours as needed for anxiety.   Yes [provider]  ?magnesium oxide (MAG-OX) 400 (240 Mg) MG tablet Take 1 tablet (400 mg total) by mouth daily. 08/31/21  Yes Sreenath, Sudheer B, MD  ?midodrine (PROAMATINE) 5 MG tablet Take 1 tablet (5 mg total) by mouth 3 (three) times daily with meals. 08/31/21  Yes Sreenath, Sudheer B, MD  ?mirtazapine (REMERON) 15 MG tablet Take 1 tablet (15 mg total) by mouth once daily at bedtime. 08/31/21  Yes Sidney Ace, MD  ?Multiple Vitamin (MULTIVITAMIN) tablet Take 1 tablet by mouth daily.   Yes  [provider]  ?ondansetron (ZOFRAN) 4 MG tablet Take 4 mg by mouth every 8 (eight) hours as needed for nausea or vomiting.   Yes [provider]  ?rivaroxaban (XARELTO) 10 MG TABS tablet Take 10 mg by mouth at bedtime.   Yes [provider]  ?sennosides-docusate sodium (SENOKOT-S) 8.6-50 MG tablet Take 1 tablet by mouth daily as needed for constipation.   Yes [provider]  ?traMADol (ULTRAM) 50 MG tablet Take 50 mg by mouth every 6 (six) hours as needed for moderate pain.   Yes [provider]  ?traZODone (DESYREL) 50 MG tablet Take 50 mg by mouth at bedtime.   Yes [provider]  ?venlafaxine XR (EFFEXOR-XR) 37.5 MG 24 hr capsule Take 37.5 mg by mouth daily.   Yes [provider]  ?enzalutamide (XTANDI) 40 MG tablet Take 4 tablets (160 mg total) by mouth daily. ?Patient not taking: Reported on 12/22/2021 12/09/21   Wyatt Portela, MD  ?feeding supplement (ENSURE ENLIVE / ENSURE PLUS) LIQD Take 237 mLs by mouth 3 (three) times daily between meals. ?Patient not taking: Reported on 10/21/2021 08/31/21   Sidney Ace, MD  ?hydrOXYzine (ATARAX/VISTARIL) 25 MG tablet Take 1 tablet (25 mg total) by mouth 3 (three) times daily as needed for anxiety. ?Patient not taking: Reported on 12/22/2021 08/31/21   Priscella Mann,  Sudheer B, MD  ?QUEtiapine (SEROQUEL) 100 MG tablet Take 1 tablet (100 mg total) by mouth at bedtime. ?Patient not taking: Reported on 12/22/2021 08/31/21   Sidney Ace, MD  ?traZODone (DESYREL) 150 MG tablet Take 1 tablet (150 mg total) by mouth at bedtime as needed for sleep. ?Patient not taking: Reported on 12/22/2021 08/31/21   Sidney Ace, MD  ?   ? ?Allergies    ?Patient has no known allergies.   ? ?Review of Systems   ?Review of Systems ? ?Physical Exam ?Updated Vital Signs ?BP 96/61   Pulse (!) 124   Temp 98.3 ?F (36.8 ?C) (Oral)   Resp (!) 24   SpO2 97%  ?Physical Exam ?Vitals and nursing note reviewed.   ?Constitutional:   ?   Appearance: He is well-developed.  ?   Comments: Cachectic  ?HENT:  ?   Head: Normocephalic and atraumatic.  ?Eyes:  ?   Pupils: Pupils are equal, round, and reactive to light.  ?Neck:  ?   Vascular: No JVD.  ?Cardiovascular:  ?   Rate and Rhythm: Regular rhythm. Tachycardia present.  ?   Heart sounds: No murmur heard. ?  No friction rub. No gallop.  ?Pulmonary:  ?   Effort: No respiratory distress.  ?   Breath sounds: No wheezing.  ?Abdominal:  ?   General: There is no distension.  ?   Tenderness: There is no abdominal tenderness. There is no guarding or rebound.  ?Musculoskeletal:     ?   General: Normal range of motion.  ?   Cervical back: Normal range of motion and neck supple.  ?Skin: ?   Coloration: Skin is not pale.  ?   Findings: No rash.  ?Neurological:  ?   Mental Status: He is alert and oriented to person, place, and time.  ?Psychiatric:     ?   Behavior: Behavior normal.  ? ? ?ED Results / Procedures / Treatments   ?Labs ?(all labs ordered are listed, but only abnormal results are displayed) ?Labs Reviewed  ?LACTIC ACID, PLASMA - Abnormal; Notable for the following components:  ?    Result Value  ? Lactic Acid, Venous 3.1 (*)   ? All other components within normal limits  ?COMPREHENSIVE METABOLIC PANEL - Abnormal; Notable for the following components:  ? Sodium 131 (*)   ? CO2 19 (*)   ? BUN 30 (*)   ? Creatinine, Ser 1.81 (*)   ? Calcium 8.7 (*)   ? Albumin 3.1 (*)   ? AST 101 (*)   ? Alkaline Phosphatase 576 (*)   ? Total Bilirubin 1.8 (*)   ? GFR, Estimated 42 (*)   ? All other components within normal limits  ?CBC WITH DIFFERENTIAL/PLATELET - Abnormal; Notable for the following components:  ? WBC 12.0 (*)   ? RBC 2.95 (*)   ? Hemoglobin 6.5 (*)   ? HCT 20.8 (*)   ? MCV 70.5 (*)   ? MCH 22.0 (*)   ? RDW 17.8 (*)   ? Neutro Abs 8.6 (*)   ? Monocytes Absolute 1.2 (*)   ? Abs Immature Granulocytes 0.18 (*)   ? All other components within normal limits  ?PROTIME-INR - Abnormal;  Notable for the following components:  ? Prothrombin Time 20.1 (*)   ? INR 1.7 (*)   ? All other components within normal limits  ?APTT - Abnormal; Notable for the following components:  ? aPTT 54 (*)   ?  All other components within normal limits  ?CULTURE, BLOOD (ROUTINE X 2)  ?CULTURE, BLOOD (ROUTINE X 2)  ?URINE CULTURE  ?RESP PANEL BY RT-PCR (FLU A&B, COVID) [id number]  ?LACTIC ACID, PLASMA  ?URINALYSIS, ROUTINE W REFLEX MICROSCOPIC  ?PREPARE RBC (CROSSMATCH)  ?TYPE AND SCREEN  ? ? ?EKG ?EKG Interpretation ? ?Date/Time:  Wednesday December 22 2021 13:54:43 EDT ?Ventricular Rate:  139 ?PR Interval:  126 ?QRS Duration: 51 ?QT Interval:  323 ?QTC Calculation: 492 ?R Axis:   81 ?Text Interpretation: Sinus tachycardia Atrial premature complex Borderline right axis deviation Borderline repolarization abnormality Borderline prolonged QT interval Since last tracing rate faster Confirmed by Deno Etienne [phone]) on 12/22/2021 2:55:35 PM ? ?Radiology ?DG Chest Port 1 View ? ?Result Date: 12/22/2021 ?CLINICAL DATA:  Hypotension, weakness, dizziness, prostate cancer EXAM: PORTABLE CHEST 1 VIEW COMPARISON:  Portable exam 1435 hours compared to 04/25/2021 FINDINGS: Normal heart size, mediastinal contours, and pulmonary vascularity. Atherosclerotic calcification aorta. Lungs clear. No pulmonary infiltrate, pleural effusion, or pneumothorax. Diffuse sclerotic osseous metastatic disease changes again seen. IMPRESSION: No acute abnormalities. Sclerotic osseous metastatic disease. Aortic Atherosclerosis (ICD10-I70.0). Electronically Signed   By: Lavonia Dana M.D.   On: 12/22/2021 14:52   ? ?Procedures ?Procedures  ? ? ?Medications Ordered in ED ?Medications  ?0.9 %  sodium chloride infusion (Manually program via Guardrails IV Fluids) (has no administration in time range)  ?lactated ringers bolus 1,000 mL (1,000 mLs Intravenous New Bag/Given 12/22/21 1413)  ?lactated ringers bolus 1,000 mL (1,000 mLs Intravenous New Bag/Given 12/22/21 1508)   ? ? ?ED Course/ Medical Decision Making/ A&P ?  ?                        ?Medical Decision Making ?Amount and/or Complexity of Data Reviewed ?Labs: ordered. ?Radiology: ordered. ?ECG/medicine tests: ordered. ? ?Risk ?Prescr

## 2021-12-22 NOTE — Progress Notes (Signed)
Pharmacy Antibiotic Note ? ?Ryan [name] is a 61 y.o. male admitted on 12/22/2021 with sepsis.  Pharmacy has been consulted for cefepime + vancomycin dosing. ? ?Today, 12/22/21 ?WBC slightly elevated ?SCr 1.81, CrCl ~36 mL/min ?Lactate 2.5 ?No weight entered for this admission. Most recent recorded wt = 59 kg from 10/21/21 oncology note used for dosing.  ? ?Plan: ?Cefepime 2 g IV q12h ?Vancomycin 1000 mg IV LD followed by 750 mg IV q24h for estimated AUC of 512 ?Goal vancomycin AUC 400-550. Check levels at steady state as needed ?Follow renal function, culture data ?  ? ?Temp (24hrs), Avg:98.3 ?F (36.8 ?C), Min:98.2 ?F (36.8 ?C), Max:98.5 ?F (36.9 ?C) ? ?Recent Labs  ?Lab 12/22/21 ?1405 12/22/21 ?1546  ?WBC 12.0*  --   ?CREATININE 1.81*  --   ?LATICACIDVEN 3.1* 2.5*  ?  ?CrCl cannot be calculated (Unknown ideal weight.).   ? ?No Known Allergies ? ?Antimicrobials this admission: ?vancomycin 3/15 >>  ?cefepime 3/15 >>  ? ?Dose adjustments this admission: ? ? ?Microbiology results: ?3/15 BCx:  ?3/15 UCx:   ? ?Ryan [name] Ryan [name] ?12/22/2021 6:49 PM ?

## 2021-12-22 NOTE — H&P (Signed)
?History and Physical  ? ? ?Patient: Ryan [name] MRN:[id number] DOB: 02/12/1961 ?DOA: 12/22/2021 ?DOS: the patient was seen and examined on 12/22/2021 ?PCP: Pcp, No  ?Patient coming from: ALF/ILF ? ?Chief Complaint:  ?Chief Complaint  ?Patient presents with  ? Hypotension  ? ?HPI: Ryan [name] is a 60 y.o. male with medical history significant of prostate CA w/ bone mets, depression, hypotension. Presenting with hypotension. He is unable to tell me why he came to the ED. His family member at bedside is unable to tell me anything but he was hypotensive. He apparently has had some falls at his facility. He appeared weak to the staff there. So he was sent to the ED for evaluation. He denies any other aggravating or alleviating factors.    ? ? ?Review of Systems: As mentioned in the history of present illness. All other systems reviewed and are negative. ?Past Medical History:  ?Diagnosis Date  ? Adenomatous colon polyp   ? Anemia   ? Depression   ? Elevated LFTs   ? ?Past Surgical History:  ?Procedure Laterality Date  ? FRACTURE SURGERY    ? INTRAMEDULLARY (IM) NAIL INTERTROCHANTERIC Right 04/02/2020  ? Procedure: INTRAMEDULLARY (IM) NAIL INTERTROCHANTRIC;  Surgeon: Poggi, John J, MD;  Location: [facility] ORS;  Service: Orthopedics;  Laterality: Right;  ? ?Social History:  reports that he has been smoking. He has never used smokeless tobacco. He reports current alcohol use. He reports that he does not use drugs. ? ?No Known Allergies ? ?Family History  ?Problem Relation Age of Onset  ? Heart disease Other   ? Colon polyps Cousin   ?     maternal  ? Colon polyps Cousin   ? Colon polyps Maternal Aunt   ? Colon cancer Maternal Grandfather   ? ? ?Prior to Admission medications   ?Medication Sig Start Date End Date Taking? Authorizing Provider  ?acetaminophen (TYLENOL) 500 MG tablet Take 1,000 mg by mouth every 8 (eight) hours as needed for mild pain.   Yes [provider]  ?calcium carbonate (OS-CAL - DOSED IN MG OF  ELEMENTAL CALCIUM) 1250 (500 Ca) MG tablet Take 1,250 mg by mouth in the morning, at noon, and at bedtime.   Yes [provider]  ?enzalutamide (XTANDI) 80 MG tablet Take 160 mg by mouth daily.   Yes [provider]  ?hydrOXYzine (VISTARIL) 25 MG capsule Take 25 mg by mouth every 8 (eight) hours as needed for anxiety.   Yes [provider]  ?magnesium oxide (MAG-OX) 400 (240 Mg) MG tablet Take 1 tablet (400 mg total) by mouth daily. 08/31/21  Yes Sreenath, Sudheer B, MD  ?midodrine (PROAMATINE) 5 MG tablet Take 1 tablet (5 mg total) by mouth 3 (three) times daily with meals. 08/31/21  Yes Sreenath, Sudheer B, MD  ?mirtazapine (REMERON) 15 MG tablet Take 1 tablet (15 mg total) by mouth once daily at bedtime. 08/31/21  Yes Sreenath, Sudheer B, MD  ?Multiple Vitamin (MULTIVITAMIN) tablet Take 1 tablet by mouth daily.   Yes [provider]  ?ondansetron (ZOFRAN) 4 MG tablet Take 4 mg by mouth every 8 (eight) hours as needed for nausea or vomiting.   Yes [provider]  ?rivaroxaban (XARELTO) 10 MG TABS tablet Take 10 mg by mouth at bedtime.   Yes [provider]  ?sennosides-docusate sodium (SENOKOT-S) 8.6-50 MG tablet Take 1 tablet by mouth daily as needed for constipation.   Yes [provider]  ?traMADol (ULTRAM) 50 MG tablet Take 50   mg by mouth every 6 (six) hours as needed for moderate pain.   Yes [provider]  ?traZODone (DESYREL) 50 MG tablet Take 50 mg by mouth at bedtime.   Yes [provider]  ?venlafaxine XR (EFFEXOR-XR) 37.5 MG 24 hr capsule Take 37.5 mg by mouth daily.   Yes [provider]  ?enzalutamide (XTANDI) 40 MG tablet Take 4 tablets (160 mg total) by mouth daily. ?Patient not taking: Reported on 12/22/2021 12/09/21   Shadad, Firas N, MD  ?feeding supplement (ENSURE ENLIVE / ENSURE PLUS) LIQD Take 237 mLs by mouth 3 (three) times daily between meals. ?Patient not taking: Reported on 10/21/2021 08/31/21    Sreenath, Sudheer B, MD  ?hydrOXYzine (ATARAX/VISTARIL) 25 MG tablet Take 1 tablet (25 mg total) by mouth 3 (three) times daily as needed for anxiety. ?Patient not taking: Reported on 12/22/2021 08/31/21   Sreenath, Sudheer B, MD  ?QUEtiapine (SEROQUEL) 100 MG tablet Take 1 tablet (100 mg total) by mouth at bedtime. ?Patient not taking: Reported on 12/22/2021 08/31/21   Sreenath, Sudheer B, MD  ?traZODone (DESYREL) 150 MG tablet Take 1 tablet (150 mg total) by mouth at bedtime as needed for sleep. ?Patient not taking: Reported on 12/22/2021 08/31/21   Sreenath, Sudheer B, MD  ? ? ?Physical Exam: ?Vitals:  ? 12/22/21 1439 12/22/21 1445 12/22/21 1500 12/22/21 1530  ?BP: (!) 84/59 (!) 95/55 (!) 89/53 96/61  ?Pulse: (!) 130 (!) 130 (!) 127 (!) 124  ?Resp: (!) 23 (!) 22 16 (!) 24  ?Temp:      ?TempSrc:      ?SpO2: 98% 98% 98% 97%  ? ?General: 60 y.o. chronically ill appearing male resting in bed in NAD ?Eyes: PERRL, normal sclera ?ENMT: Nares patent w/o discharge, orophaynx clear, dentition normal, ears w/o discharge/lesions/ulcers ?Neck: thin, trachea midline ?Cardiovascular: tachy, +S1, S2, no m/g/r, equal pulses throughout ?Respiratory: CTABL, no w/r/r, normal WOB ?GI: BS+, NDNT, no masses noted, no organomegaly noted, cachectic  ?MSK: No e/c/c ?Neuro: A&O x 3, no focal deficits ?Psyc: Flat affect, calm/cooperative; very slow/sluggish, somewhat confused  ?Data Reviewed: ? ?Na+  131 ?CO2  19 ?Scr  1.81 ?BUN  30 ?Alk phos   576 ?AST  101 ?T bili  1.8 ?Lactic acid  3.1 ?WBC 12.0 ?Hgb  6.5 ?MCV  70.5 ? ?CXR: No acute abnormalities. Sclerotic osseous metastatic disease. Aortic Atherosclerosis (ICD10-I70.0). ? ?Assessment and Plan: ?No notes have been filed under this hospital service. ?Service: Hospitalist ?Symptomatic anemia ?    - admit to inpt, progressive ?    - looks like his baseline Hgb is about 8; he's 6.5 today ?    - he denies dark or bloody stools; initially refused hemoccult in ED, agreed to one now ?    - check  hemoccult, iron studies, q6h H&H ?    - initially refused blood, but has agreed to blood now; 1 unit pRBCs were ordered ?    - let's see the hemoccult, will consult GI if positive ?    - for some reason he has xarelto on his med rec, but he denies any clot history and he denies an a fib history; this medicine is not noted on any of his recent outpt office visits; this looks like it's a DVT prophylactic dose that may have been continued at discharge ? ?Generalized weakness ?Falls ?    - secondary to chronic medical condition and anemia ?    - treat as above ?    -   will have PT/OT review ? ?AKI ?Dehydration ?    - fluids ?    - watch nephrotoxins ?    - check renal US ? ?SIRS ?Lactic acidosis ?    - he looks fairly dehydrated ?    - mild elevation of white count, but his lactic acid is 3.1 ?    - continue fluids, check Bld Cx and Ucx ?    - CXR is negative, no fevers ?    - will start abx for now ? 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    - on androgen suppression; continue outpt follow up ? ?Depression/anxiety ?    - continue home regimen ? ?Severe protein calorie malnutrition ?    - dietician consult ? ?Hyponatremia ?    - mild, fluids, follow ? ?Goals of care ?    - states he is FULL CODE ?    - will place palliative care consult ? ? Advance Care Planning:   Code Status: FULL ? ?Consults: palliative care ? ?Family Communication: w/ aunt at bedside ? ?Severity of Illness: ?The appropriate patient status for this patient is INPATIENT. Inpatient status is judged to be reasonable and necessary in order to provide the required intensity of service to ensure the patient's safety. The patient's presenting symptoms, physical exam findings, and initial radiographic and laboratory data in the context of their chronic comorbidities is felt to place them at high risk for further clinical deterioration. Furthermore, it is not anticipated that the patient will be medically stable for discharge from the hospital within 2  midnights of admission.  ? ?* I certify that at the point of admission it is my clinical judgment that the patient will require inpatient hospital care spanning beyond 2 midnights from the point of admission due to hig

## 2021-12-22 NOTE — Progress Notes (Signed)
Pharmacy Brief Note: ? ?Patient is on enzalutamide PTA for prostate cancer. The following criteria was evaluated to see if any P&T approved hold criteria was met. ? ?Enzalutamide [name]) Hold Criteria: ?Seizures ?Hgb < 8 ?WBC < 2000 ?ANC < 1000 ?Pltc < 50K ?(Screen for drug interactions) ? ? ?Hgb 6.5. Will hold Xtandi at this time.  ? ?Lenis Noon, PharmD ?12/22/21 ?9:33 PM ?

## 2021-12-22 NOTE — Progress Notes (Signed)
? ? ?  OVERNIGHT PROGRESS REPORT ? ?Notified by RN for delay in receipt of previous BP med. ?BP ok to give at the time on [facility] -once- as patient has remained awake until this time and to re-align administration time(s) ? ? ? ?Gershon Cull MSNA MSN ACNPC-AG ?Acute Care Nurse Practitioner ?[facility] ?[place] ? ? ? ?

## 2021-12-22 NOTE — Progress Notes (Signed)
Manufacturing engineer [facility]) Hospital Liaison Note ? ?Outpatient hospice referral received for hospice at [facility]. [facility] will follow for discharge planning to coordinate continuation of hospice at discharge.  ? ?TOC please call with any questions or concerns.  ? ?Thank you ? ? ?Roselee Nova, LCSW ?[place] [facility] Liaison ?[phone] ?

## 2021-12-22 NOTE — ED Notes (Signed)
RN notified of abnormal lab 

## 2021-12-22 NOTE — ED Triage Notes (Signed)
BIBA ?Per EMS: Pt coming from [facility] w/ c/o hypotension & weakness & dizziness. Hx prostate CA.  ?100/68 upon ems arrival with pt lying flat.  ?138HR  ?80/60 BP when pt sit up straight ?RR 26  ?136 CBG  ?98% RA  ?Capnography 22  ?A&O x 4  ?EMS called code sepsis  ?

## 2021-12-22 NOTE — Progress Notes (Signed)
Per J. Olena Heckle, NP ok to give PO midodrine at this time and delay AM administration.  ?

## 2021-12-22 NOTE — ED Notes (Signed)
ED Provider at bedside. 

## 2021-12-23 DIAGNOSIS — C7951 Secondary malignant neoplasm of bone: Secondary | ICD-10-CM

## 2021-12-23 DIAGNOSIS — D630 Anemia in neoplastic disease: Secondary | ICD-10-CM

## 2021-12-23 DIAGNOSIS — C61 Malignant neoplasm of prostate: Principal | ICD-10-CM

## 2021-12-23 LAB — CBC
HCT: 22.4 % — ABNORMAL LOW (ref 39.0–52.0)
Hemoglobin: 7.2 g/dL — ABNORMAL LOW (ref 13.0–17.0)
MCH: 24.4 pg — ABNORMAL LOW (ref 26.0–34.0)
MCHC: 32.1 g/dL (ref 30.0–36.0)
MCV: 75.9 fL — ABNORMAL LOW (ref 80.0–100.0)
Platelets: 239 10*3/uL (ref 150–400)
RBC: 2.95 MIL/uL — ABNORMAL LOW (ref 4.22–5.81)
RDW: 21.4 % — ABNORMAL HIGH (ref 11.5–15.5)
WBC: 11.6 10*3/uL — ABNORMAL HIGH (ref 4.0–10.5)
nRBC: 0 % (ref 0.0–0.2)

## 2021-12-23 LAB — COMPREHENSIVE METABOLIC PANEL
ALT: 7 U/L (ref 0–44)
AST: 61 U/L — ABNORMAL HIGH (ref 15–41)
Albumin: 2.6 g/dL — ABNORMAL LOW (ref 3.5–5.0)
Alkaline Phosphatase: 401 U/L — ABNORMAL HIGH (ref 38–126)
Anion gap: 9 (ref 5–15)
BUN: 27 mg/dL — ABNORMAL HIGH (ref 6–20)
CO2: 18 mmol/L — ABNORMAL LOW (ref 22–32)
Calcium: 7.6 mg/dL — ABNORMAL LOW (ref 8.9–10.3)
Chloride: 103 mmol/L (ref 98–111)
Creatinine, Ser: 1.16 mg/dL (ref 0.61–1.24)
GFR, Estimated: >60 mL/min (ref >60)
Glucose, Bld: 78 mg/dL (ref 70–99)
Potassium: 4.4 mmol/L (ref 3.5–5.1)
Sodium: 130 mmol/L — ABNORMAL LOW (ref 135–145)
Total Bilirubin: 4.3 mg/dL — ABNORMAL HIGH (ref 0.3–1.2)
Total Protein: 6.7 g/dL (ref 6.5–8.1)

## 2021-12-23 LAB — PREPARE RBC (CROSSMATCH)

## 2021-12-23 LAB — HEMOGLOBIN AND HEMATOCRIT, BLOOD
HCT: 26.1 % — ABNORMAL LOW (ref 39.0–52.0)
Hemoglobin: 8.1 g/dL — ABNORMAL LOW (ref 13.0–17.0)

## 2021-12-23 LAB — PROTIME-INR
INR: 1.8 — ABNORMAL HIGH (ref 0.8–1.2)
Prothrombin Time: 21 seconds — ABNORMAL HIGH (ref 11.4–15.2)

## 2021-12-23 LAB — PROCALCITONIN: Procalcitonin: 12.06 ng/mL

## 2021-12-23 LAB — CORTISOL-AM, BLOOD: Cortisol - AM: 19.4 ug/dL (ref 6.7–22.6)

## 2021-12-23 MED ORDER — MORPHINE SULFATE (PF) 2 MG/ML IV SOLN: 1.0000 mg | INTRAVENOUS | Status: DC | PRN

## 2021-12-23 MED ORDER — ENSURE ENLIVE PO LIQD
237.0000 mL | Freq: Three times a day (TID) | ORAL | Status: DC
  Administered 2021-12-23 – 2021-12-24 (×2): 237 mL via ORAL

## 2021-12-23 MED ORDER — MIDODRINE HCL 5 MG PO TABS
10.0000 mg | ORAL_TABLET | Freq: Three times a day (TID) | ORAL | Status: DC
  Administered 2021-12-23 – 2021-12-24 (×3): 10 mg via ORAL
  Filled 2021-12-23 (×3): qty 2

## 2021-12-23 MED ORDER — VANCOMYCIN HCL IN DEXTROSE 1-5 GM/200ML-% IV SOLN: 1000.0000 mg | INTRAVENOUS | Status: DC

## 2021-12-23 NOTE — Progress Notes (Signed)
?PROGRESS NOTE ? ? ?[name]  SKA:[id number] DOB: Feb 07, 1961 DOA: 12/22/2021 ?PCP: Pcp, No  ?Brief Narrative:  ?61 year old African-American male resident of [facility] ?Known metastatic prostate cancer recently was to be enrolled in hospice-previously had been on enzalutamide in the outpatient setting ?Also HTN, depression etc. ?He was found to have orthostatic hypotension with blood pressures in the 80s with a heart rate in the 140s and tachypnea at the facility and code sepsis was called ?This is in the setting of severe cancer cachexia and anorexia additionally ?He was found to have hemoglobin of 6.6 on admission in addition to AKI on admission and was felt to have sepsis on admission with Tmax 100.4 ? ? ? ?Hospital-Problem based course ? ?Hypotension with  component of sepsis on admission ?Hypotension likely is constitutional-patient was anemic on admission ?He was started on empiric antibiotics cefepime--I will stop the vancomycin ?His procalcitonin and lactic acid are not very specific given his metastatic cancer process but we will continue to treat at this time until he is discharged ?Continue saline 100 cc/H--PTA on midodrine continue and increase the dose to 10 X 3 times daily ?Pain control tramadol 50 every 6 as needed-May use morphine 1 mg IV as needed for severe pain ?Anemia of malignancy/chemotherapy related ?[name] has been held ?Transfused this admission 2 units PRBC ?Stop checking blood counts as he is hospice eligible-moved to every shift vitals ?AKI on admission ?Trending downwards with fluids-continue LR 100 cc/H ?Trend labs as per discussion with palliative care ?Metastatic end-stage prostate cancer on hospice ?Elevated alk phos and LFTs likely secondary to cancer process ?No further work-up at this stage ? ? ?DVT prophylaxis: SCDs ?Code Status: DNR confirmed ?Family Communication: Discussed with sister at the bedside who understands clearly that options are limited for curative  intent-hospice liaison has been contacted and I am grateful for their input into this case ?Disposition:  ?Status is: Inpatient ?Remains inpatient appropriate because: Need stable pain plan in terms of planning ?  ?Consultants:  ?Palliative care ? ?Procedures:  ? ?Antimicrobials: Cefepime ? ? ?Subjective: ?Very weak and debilitated-difficult for him to answer questions or open eyes ?Mucosa is dry ?Chest is clear no rales no rhonchi ? ? ?Objective: ?Vitals:  ? 12/23/21 0816 12/23/21 1135 12/23/21 1136 12/23/21 1200  ?BP:    (!) 121/56  ?Pulse:    84  ?Resp:    19  ?Temp: (!) 100.4 ?F (38 ?C) (!) 96.7 ?F (35.9 ?C) 97.6 ?F (36.4 ?C)   ?TempSrc: Axillary Axillary Oral   ?SpO2:    100%  ?Weight:    54.4 kg  ?Height:    '6\' 5"'$  (1.956 m)  ? ? ?Intake/Output Summary (Last 24 hours) at 12/23/2021 1221 ?Last data filed at 12/23/2021 1110 ?Gross per 24 hour  ?Intake 3068.74 ml  ?Output 930 ml  ?Net 2138.74 ml  ? ?Filed Weights  ? 12/23/21 1200  ?Weight: 54.4 kg  ? ? ?Examination: ? ?EOMI NCAT no focal deficit CTA B no added sound no rales no rhonchi ?Abdomen scaphoid by temporalis wasting supraclavicular wasting and skin tenting noted ?Mucosas dry ?No lower extremity edema ?S1-S2 tachycardic ?Psych flat affect ? ?Data Reviewed: personally reviewed  ? ?CBC ?   ?Component Value Date/Time  ? WBC 11.6 (H) 12/23/2021 0606  ? RBC 2.95 (L) 12/23/2021 0606  ? HGB 7.2 (L) 12/23/2021 0606  ? HCT 22.4 (L) 12/23/2021 0606  ? PLT 239 12/23/2021 0606  ? MCV 75.9 (L) 12/23/2021 0606  ? MCH 24.4 (  L) 12/23/2021 0606  ? MCHC 32.1 12/23/2021 0606  ? RDW 21.4 (H) 12/23/2021 0606  ? LYMPHSABS 1.9 12/22/2021 1405  ? MONOABS 1.2 (H) 12/22/2021 1405  ? EOSABS 0.0 12/22/2021 1405  ? BASOSABS 0.0 12/22/2021 1405  ? ?CMP Latest Ref Rng & Units 12/23/2021 12/22/2021 08/18/2021  ?Glucose 70 - 99 mg/dL 78 91 109(H)  ?BUN 6 - 20 mg/dL 27(H) 30(H) 16  ?Creatinine 0.61 - 1.24 mg/dL 1.16 1.81(H) 0.81  ?Sodium 135 - 145 mmol/L 130(L) 131(L) 133(L)  ?Potassium 3.5 -  5.1 mmol/L 4.4 4.8 4.8  ?Chloride 98 - 111 mmol/L 103 99 104  ?CO2 22 - 32 mmol/L 18(L) 19(L) 21(L)  ?Calcium 8.9 - 10.3 mg/dL 7.6(L) 8.7(L) 8.6(L)  ?Total Protein 6.5 - 8.1 g/dL 6.7 7.5 -  ?Total Bilirubin 0.3 - 1.2 mg/dL 4.3(H) 1.8(H) -  ?Alkaline Phos 38 - 126 U/L 401(H) 576(H) -  ?AST 15 - 41 U/L 61(H) 101(H) -  ?ALT 0 - 44 U/L 7 10 -  ? ? ? ?Radiology Studies: ?US RENAL ? ?Result Date: 12/22/2021 ?CLINICAL DATA:  Acute kidney injury EXAM: RENAL / URINARY TRACT ULTRASOUND COMPLETE COMPARISON:  None. FINDINGS: Right Kidney: Renal measurements: 11.1 x 4.4 x 5.2 cm = volume: 131 mL. No hydronephrosis or mass. Increased echotexture. Left Kidney: Renal measurements: 10.1 x 5.7 x 4.9 cm = volume: 148 mL. Diffusely increased echotexture. No mass or hydronephrosis. Bladder: Appears normal for degree of bladder distention. Other: None. IMPRESSION: No acute findings.  No hydronephrosis. Increased echotexture throughout the kidneys paddle with chronic medical renal disease. Electronically Signed   By: Rolm Baptise M.D.   On: 12/22/2021 23:08  ? ?DG Chest Port 1 View ? ?Result Date: 12/22/2021 ?CLINICAL DATA:  Hypotension, weakness, dizziness, prostate cancer EXAM: PORTABLE CHEST 1 VIEW COMPARISON:  Portable exam 1435 hours compared to 04/25/2021 FINDINGS: Normal heart size, mediastinal contours, and pulmonary vascularity. Atherosclerotic calcification aorta. Lungs clear. No pulmonary infiltrate, pleural effusion, or pneumothorax. Diffuse sclerotic osseous metastatic disease changes again seen. IMPRESSION: No acute abnormalities. Sclerotic osseous metastatic disease. Aortic Atherosclerosis (ICD10-I70.0). Electronically Signed   By: Lavonia Dana M.D.   On: 12/22/2021 14:52   ? ? ?Scheduled Meds: ? Chlorhexidine Gluconate Cloth  6 each Topical Q0600  ? midodrine  5 mg Oral TID WC  ? mirtazapine  15 mg Oral QHS  ? multivitamin with minerals  1 tablet Oral Daily  ? traZODone  50 mg Oral QHS  ? venlafaxine XR  37.5 mg Oral Daily   ? ?Continuous Infusions: ? ceFEPime (MAXIPIME) IV 2 g (12/23/21 1200)  ? lactated ringers Stopped (12/23/21 1155)  ? metronidazole Stopped (12/23/21 1155)  ? vancomycin    ? ? ? LOS: 1 day  ? ?Time spent: 79 ? ?Nita Sells, MD ?[facility] ?To contact the attending provider between 7A-7P or the covering provider during after hours 7P-7A, please log into the web site [email or web address] and access using universal [place] password for that web site. If you do not have the password, please call the hospital operator. ? ?12/23/2021, 12:21 PM  ? ? ?

## 2021-12-23 NOTE — Consult Note (Signed)
? ?                                                                                ?Consultation Note ?Date: 12/23/2021  ? ?Patient Name: Ryan [name]  ?DOB: 05/18/1961  MRN: [id number]  Age / Sex: 61 y.o., male  ?PCP: Pcp, No ?Referring Physician: Nita Sells, MD ? ?Reason for Consultation: Establishing goals of care ? ?HPI/Patient Profile: 61 y.o. male   admitted on 12/22/2021   ? ?Clinical Assessment and Goals of Care: ? Ryan [name] is a 61 y.o. male with medical history significant of prostate CA w/ bone mets, depression, hypotension. Presenting with hypotension admitted to hospital medicine service from [facility] facility. He has had a prolonged hospitalization at [facility] last year, he has been seen by palliative team in the past. [facility] advance care planning documents also noted.  ?Palliative consult received, chart reviewed, discussed with patient's aunt at bedside, later on, I arrived again at bedside to discuss with another aunt primary caregiver Kirby Funk [phone].  ?Palliative medicine is specialized medical care for people living with serious illness. It focuses on providing relief from the symptoms and stress of a serious illness. The goal is to improve quality of life for both the patient and the family. ?Goals of care: Broad aims of medical therapy in relation to the patient's values and preferences. Our aim is to provide medical care aimed at enabling patients to achieve the goals that matter most to them, given the circumstances of their particular medical situation and their constraints.  ?We discussed about metastatic prostate cancer, bone mets, and his overall condition. Differences between hospice and palliative discussed.  ?Brief life review performed, he has 3 sons, Fredrich Romans is trying to get in touch with them to let them know of the serious incurable nature of his illness.  ? ?NEXT OF KIN ? [place] ? ?SUMMARY OF RECOMMENDATIONS   ? DNR DNI ?Back to [facility]  with hospice, appreciate TOC and hospice assistance. ?Thank you for the consult.  ? ?Code Status/Advance Care Planning: ?DNR ? ? ?Symptom Management:  ?  ? ?Palliative Prophylaxis:  ?Delirium Protocol ? ?Psycho-social/Spiritual:  ?Desire for further Chaplaincy support:yes ?Additional Recommendations: Education on Hospice ? ?Prognosis:  ?< 6 months ? ?Discharge Planning:  back to [facility] with hospice.    ? ?  ? ?Primary Diagnoses: ?Present on Admission: ? Symptomatic anemia ? Hyponatremia ? Dehydration ? Protein-calorie malnutrition, severe ([place]) ? Prostate cancer metastatic to bone [facility]) ? Hypotension ? ? ?I have reviewed the medical record, interviewed the patient and family, and examined the patient. The following aspects are pertinent. ? ?Past Medical History:  ?Diagnosis Date  ? Adenomatous colon polyp   ? Anemia   ? Depression   ? Elevated LFTs   ? ?Social History  ? ?Socioeconomic History  ? Marital status: Single  ?  Spouse name: Not on file  ? Number of children: Not on file  ? Years of education: Not on file  ? Highest education level: Not on file  ?Occupational History  ? Not on file  ?Tobacco Use  ? Smoking status: Some Days  ? Smokeless tobacco: Never  ?  Substance and Sexual Activity  ? Alcohol use: Yes  ?  Comment: 4 beers weekly  ? Drug use: No  ? Sexual activity: Not on file  ?Other Topics Concern  ? Not on file  ?Social History Narrative  ? Not on file  ? ?Social Determinants of Health  ? ?Financial Resource Strain: Not on file  ?Food Insecurity: Not on file  ?Transportation Needs: Not on file  ?Physical Activity: Not on file  ?Stress: Not on file  ?Social Connections: Not on file  ? ?Family History  ?Problem Relation Age of Onset  ? Heart disease Other   ? Colon polyps Cousin   ?     maternal  ? Colon polyps Cousin   ? Colon polyps Maternal Aunt   ? Colon cancer Maternal Grandfather   ? ?Scheduled Meds: ? Chlorhexidine Gluconate Cloth  6 each Topical Q0600  ? midodrine  5 mg Oral TID WC  ?  mirtazapine  15 mg Oral QHS  ? multivitamin with minerals  1 tablet Oral Daily  ? traZODone  50 mg Oral QHS  ? venlafaxine XR  37.5 mg Oral Daily  ? ?Continuous Infusions: ? ceFEPime (MAXIPIME) IV 2 g (12/23/21 1200)  ? lactated ringers Stopped (12/23/21 1155)  ? metronidazole Stopped (12/23/21 1155)  ? vancomycin    ? ?PRN Meds:.acetaminophen **OR** acetaminophen, hydrOXYzine, morphine injection, ondansetron **OR** ondansetron (ZOFRAN) IV, senna-docusate, traMADol ?Medications Prior to Admission:  ?Prior to Admission medications   ?Medication Sig Start Date End Date Taking? Authorizing Provider  ?acetaminophen (TYLENOL) 500 MG tablet Take 1,000 mg by mouth every 8 (eight) hours as needed for mild pain.   Yes [provider]  ?calcium carbonate (OS-CAL - DOSED IN MG OF ELEMENTAL CALCIUM) 1250 (500 Ca) MG tablet Take 1,250 mg by mouth in the morning, at noon, and at bedtime.   Yes [provider]  ?enzalutamide (XTANDI) 80 MG tablet Take 160 mg by mouth daily.   Yes [provider]  ?hydrOXYzine (VISTARIL) 25 MG capsule Take 25 mg by mouth every 8 (eight) hours as needed for anxiety.   Yes [provider]  ?magnesium oxide (MAG-OX) 400 (240 Mg) MG tablet Take 1 tablet (400 mg total) by mouth daily. 08/31/21  Yes Sreenath, Sudheer B, MD  ?midodrine (PROAMATINE) 5 MG tablet Take 1 tablet (5 mg total) by mouth 3 (three) times daily with meals. 08/31/21  Yes Sreenath, Sudheer B, MD  ?mirtazapine (REMERON) 15 MG tablet Take 1 tablet (15 mg total) by mouth once daily at bedtime. 08/31/21  Yes Sidney Ace, MD  ?Multiple Vitamin (MULTIVITAMIN) tablet Take 1 tablet by mouth daily.   Yes [provider]  ?ondansetron (ZOFRAN) 4 MG tablet Take 4 mg by mouth every 8 (eight) hours as needed for nausea or vomiting.   Yes [provider]  ?rivaroxaban (XARELTO) 10 MG TABS tablet Take 10 mg by mouth at bedtime.   Yes [provider]  ?sennosides-docusate sodium  (SENOKOT-S) 8.6-50 MG tablet Take 1 tablet by mouth daily as needed for constipation.   Yes [provider]  ?traMADol (ULTRAM) 50 MG tablet Take 50 mg by mouth every 6 (six) hours as needed for moderate pain.   Yes [provider]  ?traZODone (DESYREL) 50 MG tablet Take 50 mg by mouth at bedtime.   Yes [provider]  ?venlafaxine XR (EFFEXOR-XR) 37.5 MG 24 hr capsule Take 37.5 mg by mouth daily.   Yes [provider]  ?enzalutamide [name])  40 MG tablet Take 4 tablets (160 mg total) by mouth daily. ?Patient not taking: Reported on 12/22/2021 12/09/21   Wyatt Portela, MD  ?feeding supplement (ENSURE ENLIVE / ENSURE PLUS) LIQD Take 237 mLs by mouth 3 (three) times daily between meals. ?Patient not taking: Reported on 10/21/2021 08/31/21   Sidney Ace, MD  ?hydrOXYzine (ATARAX/VISTARIL) 25 MG tablet Take 1 tablet (25 mg total) by mouth 3 (three) times daily as needed for anxiety. ?Patient not taking: Reported on 12/22/2021 08/31/21   Sidney Ace, MD  ?QUEtiapine (SEROQUEL) 100 MG tablet Take 1 tablet (100 mg total) by mouth at bedtime. ?Patient not taking: Reported on 12/22/2021 08/31/21   Sidney Ace, MD  ?traZODone (DESYREL) 150 MG tablet Take 1 tablet (150 mg total) by mouth at bedtime as needed for sleep. ?Patient not taking: Reported on 12/22/2021 08/31/21   Sidney Ace, MD  ? ?No Known Allergies ?Review of Systems ?+pain.  ? ?Physical Exam ?Flat affect ?Chronically ill appearing ?Regular work of breathing ?Monitor noted ?S 1 S 2  ?Cachectic ? ?Vital Signs: BP (!) 121/56   Pulse 84   Temp 97.6 ?F (36.4 ?C) (Oral)   Resp 19   SpO2 100%  ?Pain Scale: 0-10 ?  ?Pain Score: 0-No pain ? ? ?SpO2: SpO2: 100 % ?O2 Device:SpO2: 100 % ?O2 Flow Rate: .  ? ?IO: Intake/output summary:  ?Intake/Output Summary (Last 24 hours) at 12/23/2021 1210 ?Last data filed at 12/23/2021 1110 ?Gross per 24 hour  ?Intake 3068.74 ml  ?Output 930 ml  ?Net 2138.74 ml  ? ? ?LBM:  Last BM Date : 12/23/21 ?Baseline Weight:   ?Most recent weight:       ?Palliative Assessment/Data: ? ? ?PPS 40% ? ?Time In:  3 ?Time Out:  12 ?Time Total:  60  ?Greater than 50%  of this time was spent counseling an

## 2021-12-23 NOTE — TOC Initial Note (Signed)
Transition of Care (TOC) - Initial/Assessment Note  ? ?Patient Details  ?Name: Ryan [name] ?MRN: [id number] ?Date of Birth: [birth year] ? ?Transition of Care (TOC) CM/SW Contact:    ?Sherie Don, LCSW ?Phone Number: ?12/23/2021, 12:31 PM ? ?Clinical Narrative: CSW notified by Lorayne Bender with [facility] that patient's ALF, [facility], made a referral to [facility] prior to admission. [facility] to meet with patient and family today prior to discharging back to the ALF with hospice services. ? ?CSW spoke with Abigail Butts at [facility] and confirmed patient can return to the facility as he does not yet meet criteria for [facility], but will need to be on PO medications only. Patient will need a new FL2 and hard scripts for any pain medications. FL2 started. TOC to follow. ? ?Expected Discharge Plan: Assisted Living (Patient to return to [facility] ALF with hospice through [facility].) ?Barriers to Discharge: Continued Medical Work up ? ?Patient Goals and CMS Choice ?CMS Medicare.gov Compare Post Acute Care list provided to:: Patient ?Choice offered to / list presented to : Patient ? ?Expected Discharge Plan and Services ?Expected Discharge Plan: Assisted Living (Patient to return to [facility] ALF with hospice through [facility].) ?In-house Referral: Clinical Social Work ?Post Acute Care Choice: Hospice ?Living arrangements for the past 2 months: [place]           ?DME Arranged: N/A ?DME Agency: NA ? ?Prior Living Arrangements/Services ?Living arrangements for the past 2 months: [place] ?Lives with:: Facility Resident ?Patient language and need for interpreter reviewed:: Yes ?Do you feel safe going back to the place where you live?: Yes      ?Need for Family Participation in Patient Care: Yes (Comment) ?Care giver support system in place?: Yes (comment) ?Criminal Activity/Legal Involvement Pertinent to Current Situation/Hospitalization: No - Comment as  needed ? ?Activities of Daily Living ?Home Assistive Devices/Equipment: None ?ADL Screening (condition at time of admission) ?Patient's cognitive ability adequate to safely complete daily activities?: Yes ?Is the patient deaf or have difficulty hearing?: No ?Does the patient have difficulty seeing, even when wearing glasses/contacts?: No ?Does the patient have difficulty concentrating, remembering, or making decisions?: No ?Patient able to express need for assistance with ADLs?: Yes ?Does the patient have difficulty dressing or bathing?: Yes ?Independently performs ADLs?: No ?Does the patient have difficulty walking or climbing stairs?: Yes ?Weakness of Legs: Both ?Weakness of Arms/Hands: Both ? ?Emotional Assessment ?Orientation: : Oriented to Self, Oriented to Place, Oriented to  Time, Oriented to Situation ?Alcohol / Substance Use: Tobacco Use ? ?Admission diagnosis:  AKI (acute kidney injury) ([place]) [N17.9] ?Symptomatic anemia [D64.9] ?Hypotension due to hypovolemia [I95.89, E86.1] ?Patient Active Problem List  ? Diagnosis Date Noted  ? Depression 12/22/2021  ? Lactic acidosis 12/22/2021  ? SIRS (systemic inflammatory response syndrome) ([place]) 12/22/2021  ? AKI (acute kidney injury) ([place]) 12/22/2021  ? Generalized weakness 12/22/2021  ? Prostate cancer ([place]) 10/21/2021  ? Closed bimalleolar fracture of left ankle   ? Closed fracture of left distal radius   ? Closed left ankle fracture 06/03/2021  ? Left wrist fracture, closed, initial encounter 06/03/2021  ? Hypocalcemia 06/03/2021  ? Hyperkalemia 06/03/2021  ? Hypotension 04/07/2021  ? Prostate cancer metastatic to bone ([place]) 04/06/2021  ? Anemia of chronic disease 03/30/2021  ? Thrombocytopenia ([place]) 03/30/2021  ? Thoracic compression fracture, closed, initial encounter ([place]) 03/30/2021  ? Pathologic fracture of thoracic vertebrae, initial encounter 03/30/2021  ? Malnourished ([place]) 03/30/2021  ? Protein-calorie malnutrition, severe (  [place]) 03/30/2021  ? 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 Infestation by bed bug 03/30/2021  ? Falls   ? Transaminitis   ? Pain   ? Closed right hip fracture ([place]) 04/01/2020  ? Symptomatic anemia 04/01/2020  ? Hyponatremia 04/01/2020  ? Closed rib fracture 04/01/2020  ? Leukocytosis 04/01/2020  ? Rhabdomyolysis 04/01/2020  ? Dehydration 04/01/2020  ? ?PCP:  Pcp, No ?Pharmacy:  No Pharmacies Listed ? ?Readmission Risk Interventions ?Readmission Risk Prevention Plan 12/23/2021 04/23/2021  ?Transportation Screening Complete Complete  ?PCP or Specialist Appt within 3-5 Days - Complete  ?[place] or Home Care Consult - Complete  ?Social Work Consult for [place] Planning/Counseling - Not Complete  ?SW consult not completed comments - RNCM assigned to patient  ?Palliative Care Screening - Complete  ?Medication Review Press photographer) Complete Complete  ?[place] or Home Care Consult Complete -  ?SW Recovery Care/Counseling Consult Complete -  ?Palliative Care Screening Complete -  ?[place] Not Applicable -  ?Some recent data might be hidden  ? ? ? ?

## 2021-12-23 NOTE — Progress Notes (Signed)
Patient has had 4 type 7 stools, MD Samtani notified. Patient is afebrile. Will continue to monitor.  ? ?No new orders at this time.  ?

## 2021-12-23 NOTE — Progress Notes (Signed)
De-escalating care currently in keeping with comfort philosophy.  Can be triaged off unit if urgent bed needed. ? ?Verneita Griffes, MD ?[facility] ?6:31 PM ? ?

## 2021-12-23 NOTE — Progress Notes (Addendum)
Initial Nutrition Assessment ? ?DOCUMENTATION CODES:  ? ?Severe malnutrition in context of chronic illness, Underweight ? ?INTERVENTION:  ?- will order Ensure Plus High Protein TID, each supplement provides 350 kcal and 20 grams of protein. ?- will check serum vitamin D and zinc d/t cancer dx.--discontinued orders for these d/t plan for d/c on 3/17. ? ? ?NUTRITION DIAGNOSIS:  ? ?Severe Malnutrition related to chronic illness, cancer and cancer related treatments as evidenced by severe fat depletion, severe muscle depletion. ? ?GOAL:  ? ?Patient will meet greater than or equal to 90% of their needs ? ?MONITOR:  ? ?PO intake, Supplement acceptance, Labs, Weight trends ? ?REASON FOR ASSESSMENT:  ? ?Malnutrition Screening Tool ? ?ASSESSMENT:  ? ?61 y.o. male with medical history of prostate cancer with bone mets, depression, hypotension, and anemia. He presented to the ED with hypotension and reported falls at the facility. He was admitted for symptomatic anemia. ? ?Able to talk with RN about patient. RN was able to obtain a weight as patient had not been weighed since 10/2021. ? ?Patient laying in bed with who he reports is his aunt who is like a mom and sister to him at bedside.  ? ?He requests that BLE be not be assessed during NFPE as he needed cleaned up from going to the bathroom.  ? ?He denies eating breakfast but was receptive to RD ordering lunch for him and that he would eat some if he felt up to it. He is not feeling hungry today and reports this is usual for him. ? ?The last time he ate was Tuesday (3/14) night. At home he drinks Ensure or similar BID or TID and is very receptive to receiving them during hospitalization. ? ?No additional information able to be obtained as patient focused on needing to be cleaned up; RN aware. ? ?Weight today is 120 lb and weight on 10/21/21 was 131 lb. This indicates 11 lb weight loss (8.4% body weight) in the past 2 months; significant for time frame. ? ?Palliative Care  following.  ? ? ?Labs reviewed; Na: 130 mmol/l, BUN: 27 mg/dl, Ca: 7.6 mg/dl, Alk Phos elevated, iron: 17 ug/dl, ferritin: 2569 ng/ml, vitamin B12 WDL. ? ?Medications reviewed; 15 mg remeron/day. ? ?IVF; LR @ 100 ml/hr.  ?  ? ?NUTRITION - FOCUSED PHYSICAL EXAM: ? ?Flowsheet Row Most Recent Value  ?Orbital Region Moderate depletion  ?Upper Arm Region Severe depletion  ?Thoracic and Lumbar Region Unable to assess  ?Buccal Region Severe depletion  ?Temple Region Moderate depletion  ?Clavicle Bone Region Severe depletion  ?Clavicle and Acromion Bone Region Severe depletion  ?Scapular Bone Region Severe depletion  ?Dorsal Hand Moderate depletion  ?Patellar Region Unable to assess  ?Anterior Thigh Region Unable to assess  ?Posterior Calf Region Unable to assess  ?Edema (RD Assessment) Unable to assess  ?Hair Reviewed  ?Eyes Reviewed  ?Mouth Reviewed  ?Skin Reviewed  ?Nails Reviewed  ? ?  ? ? ?Diet Order:   ?Diet Order   ? ?       ?  Diet regular Room service appropriate? Yes; Fluid consistency: Thin  Diet effective now       ?  ? ?  ?  ? ?  ? ? ?EDUCATION NEEDS:  ? ?Education needs have been addressed ? ?Skin:  Skin Assessment: Reviewed RN Assessment ? ?Last BM:  3/16 (type 7, small amount) ? ?Height:  ? ?Ht Readings from Last 1 Encounters:  ?12/23/21 6' 5" (1.956 m)  ? ? ?Weight:  ? ?  Wt Readings from Last 1 Encounters:  ?12/23/21 54.4 kg  ? ? ? ?BMI:  Body mass index is 14.22 kg/m?. ? ?Estimated Nutritional Needs:  ?Kcal:  2400-2600 kcal ?Protein:  120-130 grams ?Fluid:  >/= 2.5 L/day ? ? ? ? ? , MS, RD, LDN ?Registered Dietitian II ?Inpatient Clinical Nutrition ?RD pager # and on-call/weekend pager # available in AMION  ? ?

## 2021-12-23 NOTE — Progress Notes (Addendum)
Pharmacy Antibiotic Note ? ?Ryan [name] is a 61 y.o. male admitted on 12/22/2021 with sepsis.  Pharmacy has been consulted for cefepime + vancomycin dosing. ? ?Today, 12/23/21 ?WBC slightly elevated 12> 11.6 ?SCr 1.81> 1.16 , CrCl ~56 mL/min ?Lactate 2.5>>1.9 ?Tm 101.8 ?PCT 12.06 ?CXR neg, renal US neg, UA neg ?No weight entered for this admission. Most recent recorded wt = 59 kg from 10/21/21 oncology note used for dosing.  ? ?Plan: ?Continue Cefepime 2 g IV q12h ?Change Vancomycin to 1000 mg IV q24h for estimated AUC of 451.5 ?Flagyl 500 mg IV q12 per MD ?Goal vancomycin AUC 400-550. Check levels at steady state as needed ?Follow renal function, culture data ?De-escalate as able ?  ? ?Temp (24hrs), Avg:99 ?F (37.2 ?C), Min:98 ?F (36.7 ?C), Max:101.8 ?F (38.8 ?C) ? ?Recent Labs  ?Lab 12/22/21 ?1405 12/22/21 ?1546 12/22/21 ?2245 12/23/21 ?0606  ?WBC 12.0*  --   --  11.6*  ?CREATININE 1.81*  --   --  1.16  ?LATICACIDVEN 3.1* 2.5* 1.9  --   ? ?  ?CrCl cannot be calculated (Unknown ideal weight.).   ? ?No Known Allergies ? ?Antimicrobials this admission: ?vancomycin 3/15 >>  ?cefepime 3/15 >>  ?Flagyl 3/15>> ?Dose adjustments this admission: ?3/16 vanc 750 q24> vanc 1 gm q24 ?Microbiology results: ?3/15 BCx:  ?3/15 UCx:   ?3/15 MRSA neg ? ?Eudelia Bunch, Pharm.D ?12/23/2021 10:02 AM ? ?

## 2021-12-23 NOTE — Progress Notes (Addendum)
Manufacturing engineer [facility]) Hospital Liaison Note ? ?Referral received for patient/family interest in hospice at [facility]. [facility] liaison met with patient and aunt Lorna Few. Interest confirmed ? ?Chart under review by [facility] physician.  ? ?Hospice eligibility confirmed. ? ?Plan is to discharge back to facility tomorrow via [facility]. ? ?Patient has walker at facility. No other DME needs at this time.  ? ?Please send comfort medications/prescriptions with patient at discharge.  ? ?Please call with any questions or concerns. Thank you ? ? ?Roselee Nova, LCSW ?[facility] Liaison  ?[phone] ?

## 2021-12-24 DIAGNOSIS — Z7189 Other specified counseling: Secondary | ICD-10-CM

## 2021-12-24 DIAGNOSIS — Z515 Encounter for palliative care: Secondary | ICD-10-CM

## 2021-12-24 MED ORDER — CLONAZEPAM 1 MG PO TABS: 1.0000 mg | ORAL_TABLET | Freq: Three times a day (TID) | ORAL | 0 refills | Status: AC

## 2021-12-24 MED ORDER — TRAMADOL HCL 50 MG PO TABS: 50.0000 mg | ORAL_TABLET | Freq: Four times a day (QID) | ORAL | 0 refills | Status: DC | PRN

## 2021-12-24 NOTE — Progress Notes (Deleted)
Physician Discharge Summary  Ryan [name] ZOX:[id number] DOB: 1960-12-29 DOA: 12/22/2021  PCP: Pcp, No  Admit date: 12/22/2021 Discharge date: 12/24/2021  Time spent: 47 minutes  Recommendations for Outpatient Follow-up:  Patient will discharge back to [facility] assisted living with hospice following  Discharge Diagnoses:  MAIN problem for hospitalization   Metastatic prostate cancer  Please see below for itemized issues addressed in HOpsital- refer to other progress notes for clarity if needed  Discharge Condition: Very guarded  Diet recommendation: Comfort  Filed Weights   12/23/21 1200  Weight: 54.4 kg    History of present illness:  61 year old African-American male resident of [facility] since hip fracture in January Known metastatic prostate cancer recently enrolled in hospice previously on Xtandi HTN depression Was seen at facility and found to be hypotensive tachycardic code sepsis called Also found to have hemoglobin of 6 on admission 3/15  Patient was admitted to the hospital and started on broad-spectrum antibiotics as elevated procalcitonin lactic acid-it was felt that patient's hypotension was constitutional likely related to his metastatic prostate cancer but also possibly sepsis related Patient had some diarrhea in the hospital that was not worked up He was transfused 2 units of blood and we had long goals of care discussions with him  I discussed with his family as well including his HCPOA Sister Cecelia  Decision was made after multiple consultant input including oncology and palliative care that his course was terminal and he will probably follow-up in the outpatient setting with hospice at his ALF I had a long discussion with him on day of discharge and he understands the grave prognosis     Discharge Exam: Vitals:   12/24/21 0700 12/24/21 0755  BP:    Pulse: (!) 107   Resp: (!) 23   Temp:  98.4 F (36.9 C)  SpO2: 95%     Subj on day of d/c    awake today no distress Seems to understand although seems somewhat surprised that he is close to his ultimate demise Palliative care came back around and talked with him as well  General Exam on discharge  NCAT no focal deficit CTA B no rales rhonchi Quite cachectic Abdomen soft no rebound no guarding rhonchi ROM intact   Discharge Instructions   Discharge Instructions     Diet - low sodium heart healthy   Complete by: As directed    Increase activity slowly   Complete by: As directed       Allergies as of 12/24/2021   No Known Allergies      Medication List     STOP taking these medications    acetaminophen 500 MG tablet Commonly known as: TYLENOL   calcium carbonate 1250 (500 Ca) MG tablet Commonly known as: OS-CAL - dosed in mg of elemental calcium   enzalutamide 40 MG tablet Commonly known as: Xtandi   enzalutamide 80 MG tablet Commonly known as: XTANDI   feeding supplement Liqd   hydrOXYzine 25 MG capsule Commonly known as: VISTARIL   hydrOXYzine 25 MG tablet Commonly known as: ATARAX   magnesium oxide 400 (240 Mg) MG tablet Commonly known as: MAG-OX   midodrine 5 MG tablet Commonly known as: PROAMATINE   mirtazapine 15 MG tablet Commonly known as: REMERON   multivitamin tablet   ondansetron 4 MG tablet Commonly known as: ZOFRAN   QUEtiapine 100 MG tablet Commonly known as: SEROQUEL   rivaroxaban 10 MG Tabs tablet Commonly known as: XARELTO   sennosides-docusate sodium 8.6-50 MG  tablet Commonly known as: SENOKOT-S   traZODone 150 MG tablet Commonly known as: DESYREL   traZODone 50 MG tablet Commonly known as: DESYREL   venlafaxine XR 37.5 MG 24 hr capsule Commonly known as: EFFEXOR-XR       TAKE these medications    clonazePAM 1 MG tablet Commonly known as: KlonoPIN Take 1 tablet (1 mg total) by mouth 3 (three) times daily.   traMADol 50 MG tablet Commonly known as: ULTRAM Take 1 tablet (50 mg total) by mouth  every 6 (six) hours as needed for moderate pain.       No Known Allergies    The results of significant diagnostics from this hospitalization (including imaging, microbiology, ancillary and laboratory) are listed below for reference.    Significant Diagnostic Studies: US RENAL  Result Date: 12/22/2021 CLINICAL DATA:  Acute kidney injury EXAM: RENAL / URINARY TRACT ULTRASOUND COMPLETE COMPARISON:  None. FINDINGS: Right Kidney: Renal measurements: 11.1 x 4.4 x 5.2 cm = volume: 131 mL. No hydronephrosis or mass. Increased echotexture. Left Kidney: Renal measurements: 10.1 x 5.7 x 4.9 cm = volume: 148 mL. Diffusely increased echotexture. No mass or hydronephrosis. Bladder: Appears normal for degree of bladder distention. Other: None. IMPRESSION: No acute findings.  No hydronephrosis. Increased echotexture throughout the kidneys paddle with chronic medical renal disease. Electronically Signed   By: Charlett Nose M.D.   On: 12/22/2021 23:08   DG Chest Port 1 View  Result Date: 12/22/2021 CLINICAL DATA:  Hypotension, weakness, dizziness, prostate cancer EXAM: PORTABLE CHEST 1 VIEW COMPARISON:  Portable exam 1435 hours compared to 04/25/2021 FINDINGS: Normal heart size, mediastinal contours, and pulmonary vascularity. Atherosclerotic calcification aorta. Lungs clear. No pulmonary infiltrate, pleural effusion, or pneumothorax. Diffuse sclerotic osseous metastatic disease changes again seen. IMPRESSION: No acute abnormalities. Sclerotic osseous metastatic disease. Aortic Atherosclerosis (ICD10-I70.0). Electronically Signed   By: Ulyses Southward M.D.   On: 12/22/2021 14:52    Microbiology: Recent Results (from the past 240 hour(s))  Blood Culture (routine x 2)     Status: None (Preliminary result)   Collection Time: 12/22/21  2:00 PM   Specimen: BLOOD LEFT FOREARM  Result Value Ref Range Status   Specimen Description   Final    BLOOD LEFT FOREARM Performed at [facility], [address] [place], [place] [address]    Special Requests   Final    BOTTLES DRAWN AEROBIC AND ANAEROBIC Blood Culture adequate volume Performed at [facility], [address] [place], [place] [address]    Culture   Final    NO GROWTH 2 DAYS Performed at [facility], [address], [place], [place] [address]    Report Status PENDING  Incomplete  Blood Culture (routine x 2)     Status: None (Preliminary result)   Collection Time: 12/22/21  2:05 PM   Specimen: BLOOD  Result Value Ref Range Status   Specimen Description   Final    BLOOD RIGHT ANTECUBITAL Performed at [facility], [address] [place], [place] [address]    Special Requests   Final    BOTTLES DRAWN AEROBIC AND ANAEROBIC Blood Culture adequate volume Performed at [facility], [address], [place], [place] [address]    Culture   Final    NO GROWTH 2 DAYS Performed at [facility], [address], [place], [place] [address]    Report Status PENDING  Incomplete  Resp Panel by RT-PCR (Flu  A&B, Covid) Nasopharyngeal Swab     Status: None   Collection Time: 12/22/21  3:04 PM   Specimen: Nasopharyngeal Swab; Nasopharyngeal(NP) swabs in vial transport medium  Result Value Ref Range Status   SARS Coronavirus 2 by RT PCR NEGATIVE NEGATIVE Final    Comment: (NOTE) SARS-CoV-2 target nucleic acids are NOT DETECTED.  The SARS-CoV-2 RNA is generally detectable in upper respiratory specimens during the acute phase of infection. The lowest concentration of SARS-CoV-2 viral copies this assay can detect is 138 copies/mL. A negative result does not preclude SARS-Cov-2 infection and should not be used as the sole basis for treatment or other patient management decisions. A negative result may occur with  improper specimen collection/handling, submission of specimen other than nasopharyngeal swab, presence of viral mutation(s) within the areas targeted by  this assay, and inadequate number of viral copies(<138 copies/mL). A negative result must be combined with clinical observations, patient history, and epidemiological information. The expected result is Negative.  Fact Sheet for Patients:  BloggerCourse.com  Fact Sheet for Healthcare Providers:  SeriousBroker.it  This test is no t yet approved or cleared by the [place] FDA and  has been authorized for detection and/or diagnosis of SARS-CoV-2 by FDA under an Emergency Use Authorization (EUA). This EUA will remain  in effect (meaning this test can be used) for the duration of the COVID-19 declaration under Section 564(b)(1) of the Act, 21 U.S.C.section 360bbb-3(b)(1), unless the authorization is terminated  or revoked sooner.       Influenza A by PCR NEGATIVE NEGATIVE Final   Influenza B by PCR NEGATIVE NEGATIVE Final    Comment: (NOTE) The Xpert Xpress SARS-CoV-2/FLU/RSV plus assay is intended as an aid in the diagnosis of influenza from Nasopharyngeal swab specimens and should not be used as a sole basis for treatment. Nasal washings and aspirates are unacceptable for Xpert Xpress SARS-CoV-2/FLU/RSV testing.  Fact Sheet for Patients: BloggerCourse.com  Fact Sheet for Healthcare Providers: SeriousBroker.it  This test is not yet approved or cleared by the [place] FDA and has been authorized for detection and/or diagnosis of SARS-CoV-2 by FDA under an Emergency Use Authorization (EUA). This EUA will remain in effect (meaning this test can be used) for the duration of the COVID-19 declaration under Section 564(b)(1) of the Act, 21 U.S.C. section 360bbb-3(b)(1), unless the authorization is terminated or revoked.  Performed at [facility], [address], [place], [place] [address]   Urine Culture     Status: None (Preliminary result)   Collection  Time: 12/22/21  5:35 PM   Specimen: In/Out Cath Urine  Result Value Ref Range Status   Specimen Description   Final    IN/OUT CATH URINE Performed at [facility], [address], [place], [place] [address]    Special Requests   Final    NONE Performed at [facility], [address] [place], [place] [address]    Culture   Final    CULTURE REINCUBATED FOR BETTER GROWTH Performed at [facility], [address], [place], [place] [address]    Report Status PENDING  Incomplete  MRSA Next Gen by PCR, Nasal     Status: None   Collection Time: 12/22/21  8:55 PM   Specimen: Nasal Mucosa; Nasal Swab  Result Value Ref Range Status   MRSA by PCR Next Gen NOT DETECTED NOT DETECTED Final    Comment: (NOTE) The GeneXpert MRSA Assay (FDA approved for NASAL specimens only),  is one component of a comprehensive MRSA colonization surveillance program. It is not intended to diagnose MRSA infection nor to guide or monitor treatment for MRSA infections. Test performance is not FDA approved in patients less than 63 years old. Performed at [facility], [address], [place], [place] [address]      Labs: Basic Metabolic Panel: Recent Labs  Lab 12/22/21 1405 12/23/21 0606  NA 131* 130*  K 4.8 4.4  CL 99 103  CO2 19* 18*  GLUCOSE 91 78  BUN 30* 27*  CREATININE 1.81* 1.16  CALCIUM 8.7* 7.6*   Liver Function Tests: Recent Labs  Lab 12/22/21 1405 12/23/21 0606  AST 101* 61*  ALT 10 7  ALKPHOS 576* 401*  BILITOT 1.8* 4.3*  PROT 7.5 6.7  ALBUMIN 3.1* 2.6*   No results for input(s): LIPASE, AMYLASE in the last 168 hours. No results for input(s): AMMONIA in the last 168 hours. CBC: Recent Labs  Lab 12/22/21 1405 12/22/21 2245 12/23/21 0606 12/23/21 1209  WBC 12.0*  --  11.6*  --   NEUTROABS 8.6*  --   --   --   HGB 6.5* 6.3* 7.2* 8.1*  HCT 20.8* 20.2* 22.4* 26.1*  MCV 70.5*  --  75.9*  --   PLT 327  --  239  --     Cardiac Enzymes: No results for input(s): CKTOTAL, CKMB, CKMBINDEX, TROPONINI in the last 168 hours. BNP: BNP (last 3 results) No results for input(s): BNP in the last 8760 hours.  ProBNP (last 3 results) No results for input(s): PROBNP in the last 8760 hours.  CBG: No results for input(s): GLUCAP in the last 168 hours.     Signed:  Rhetta Mura MD   [facility] 12/24/2021, 11:14 AM

## 2021-12-24 NOTE — Discharge Summary (Signed)
Physician Discharge Summary  ?[name] HRC:[id number] DOB: 19-Mar-1961 DOA: 12/22/2021 ? ?PCP: Pcp, No ? ?Admit date: 12/22/2021 ?Discharge date: 12/24/2021 ? ?Time spent: 47 minutes ? ?Recommendations for Outpatient Follow-up:  ?Patient will discharge back to [facility] assisted living with hospice following ? ?Discharge Diagnoses:  ?MAIN problem for hospitalization  ? ?Metastatic prostate cancer ? ?Please see below for itemized issues addressed in HOpsital- ?refer to other progress notes for clarity if needed ? ?Discharge Condition: Very guarded ? ?Diet recommendation: Comfort ? ?Filed Weights  ? 12/23/21 1200  ?Weight: 54.4 kg  ? ? ?History of present illness:  ?61 year old African-American male resident of [facility] since hip fracture in January ?Known metastatic prostate cancer recently enrolled in hospice previously on Xtandi HTN depression ?Was seen at facility and found to be hypotensive tachycardic code sepsis called ?Also found to have hemoglobin of 6 on admission 3/15 ? ?Patient was admitted to the hospital and started on broad-spectrum antibiotics as elevated procalcitonin lactic acid-it was felt that patient's hypotension was constitutional likely related to his metastatic prostate cancer but also possibly sepsis related ?Patient had some diarrhea in the hospital that was not worked up ?He was transfused 2 units of blood and we had long goals of care discussions with him ? ?I discussed with his family as well including his HCPOA Sister Cecelia ? ?Decision was made after multiple consultant input including oncology and palliative care that his course was terminal and he will probably follow-up in the outpatient setting with hospice at his ALF ?I had a long discussion with him on day of discharge and he understands the grave prognosis  ? ? ? ?Discharge Exam: ?Vitals:  ? 12/24/21 0755 12/24/21 1200  ?BP:    ?Pulse:    ?Resp:    ?Temp: 98.4 ?F (36.9 ?C) 98.4 ?F (36.9 ?C)  ?SpO2:    ? ? ?Subj on day of  d/c ?  ?awake today no distress ?Seems to understand although seems somewhat surprised that he is close to his ultimate demise ?Palliative care came back around and talked with him as well ? ?General Exam on discharge ? ?NCAT no focal deficit ?CTA B no rales rhonchi ?Quite cachectic ?Abdomen soft no rebound no guarding rhonchi ?ROM intact ? ? ?Discharge Instructions ? ? ?Discharge Instructions   ? ? Diet - low sodium heart healthy   Complete by: As directed ?  ? Increase activity slowly   Complete by: As directed ?  ? ?  ? ?Allergies as of 12/24/2021   ?No Known Allergies ?  ? ?  ?Medication List  ?  ? ?STOP taking these medications   ? ?acetaminophen 500 MG tablet ?Commonly known as: TYLENOL ?  ?calcium carbonate 1250 (500 Ca) MG tablet ?Commonly known as: OS-CAL - dosed in mg of elemental calcium ?  ?enzalutamide 40 MG tablet ?Commonly known as: Xtandi ?  ?enzalutamide 80 MG tablet ?Commonly known as: XTANDI ?  ?feeding supplement Liqd ?  ?hydrOXYzine 25 MG capsule ?Commonly known as: VISTARIL ?  ?hydrOXYzine 25 MG tablet ?Commonly known as: ATARAX ?  ?magnesium oxide 400 (240 Mg) MG tablet ?Commonly known as: MAG-OX ?  ?midodrine 5 MG tablet ?Commonly known as: PROAMATINE ?  ?mirtazapine 15 MG tablet ?Commonly known as: REMERON ?  ?multivitamin tablet ?  ?ondansetron 4 MG tablet ?Commonly known as: ZOFRAN ?  ?QUEtiapine 100 MG tablet ?Commonly known as: SEROQUEL ?  ?rivaroxaban 10 MG Tabs tablet ?Commonly known as: XARELTO ?  ?sennosides-docusate sodium 8.6-50  MG tablet ?Commonly known as: SENOKOT-S ?  ?traZODone 150 MG tablet ?Commonly known as: DESYREL ?  ?traZODone 50 MG tablet ?Commonly known as: DESYREL ?  ?venlafaxine XR 37.5 MG 24 hr capsule ?Commonly known as: EFFEXOR-XR ?  ? ?  ? ?TAKE these medications   ? ?clonazePAM 1 MG tablet ?Commonly known as: KlonoPIN ?Take 1 tablet (1 mg total) by mouth 3 (three) times daily. ?  ?traMADol 50 MG tablet ?Commonly known as: ULTRAM ?Take 1 tablet (50 mg total) by  mouth every 6 (six) hours as needed for moderate pain. ?  ? ?  ? ?No Known Allergies ? ? ? ?The results of significant diagnostics from this hospitalization (including imaging, microbiology, ancillary and laboratory) are listed below for reference.   ? ?Significant Diagnostic Studies: ?US RENAL ? ?Result Date: 12/22/2021 ?CLINICAL DATA:  Acute kidney injury EXAM: RENAL / URINARY TRACT ULTRASOUND COMPLETE COMPARISON:  None. FINDINGS: Right Kidney: Renal measurements: 11.1 x 4.4 x 5.2 cm = volume: 131 mL. No hydronephrosis or mass. Increased echotexture. Left Kidney: Renal measurements: 10.1 x 5.7 x 4.9 cm = volume: 148 mL. Diffusely increased echotexture. No mass or hydronephrosis. Bladder: Appears normal for degree of bladder distention. Other: None. IMPRESSION: No acute findings.  No hydronephrosis. Increased echotexture throughout the kidneys paddle with chronic medical renal disease. Electronically Signed   By: Rolm Baptise M.D.   On: 12/22/2021 23:08  ? ?DG Chest Port 1 View ? ?Result Date: 12/22/2021 ?CLINICAL DATA:  Hypotension, weakness, dizziness, prostate cancer EXAM: PORTABLE CHEST 1 VIEW COMPARISON:  Portable exam 1435 hours compared to 04/25/2021 FINDINGS: Normal heart size, mediastinal contours, and pulmonary vascularity. Atherosclerotic calcification aorta. Lungs clear. No pulmonary infiltrate, pleural effusion, or pneumothorax. Diffuse sclerotic osseous metastatic disease changes again seen. IMPRESSION: No acute abnormalities. Sclerotic osseous metastatic disease. Aortic Atherosclerosis (ICD10-I70.0). Electronically Signed   By: Lavonia Dana M.D.   On: 12/22/2021 14:52   ? ?Microbiology: ?Recent Results (from the past 240 hour(s))  ?Blood Culture (routine x 2)     Status: None (Preliminary result)  ? Collection Time: 12/22/21  2:00 PM  ? Specimen: BLOOD LEFT FOREARM  ?Result Value Ref Range Status  ? Specimen Description   Final  ?  BLOOD LEFT FOREARM ?Performed at [facility], [place]  [address] [place], [place] [address] ?  ? Special Requests   Final  ?  BOTTLES DRAWN AEROBIC AND ANAEROBIC Blood Culture adequate volume ?Performed at [facility], [place] [address], [place], [place] [address] ?  ? Culture   Final  ?  NO GROWTH 2 DAYS ?Performed at [facility], [place] [address], [place], [place] [address] ?  ? Report Status PENDING  Incomplete  ?Blood Culture (routine x 2)     Status: None (Preliminary result)  ? Collection Time: 12/22/21  2:05 PM  ? Specimen: BLOOD  ?Result Value Ref Range Status  ? Specimen Description   Final  ?  BLOOD RIGHT ANTECUBITAL ?Performed at [facility], [place] [address] [place], [place] [address] ?  ? Special Requests   Final  ?  BOTTLES DRAWN AEROBIC AND ANAEROBIC Blood Culture adequate volume ?Performed at [facility], [place] [address], [place], [place] [address] ?  ? Culture   Final  ?  NO GROWTH 2 DAYS ?Performed at [facility], [place] [address] [place],  [address] ?  ? Report Status PENDING  Incomplete  ?Resp Panel by RT-PCR (  Flu A&B, Covid) Nasopharyngeal Swab     Status: None  ? Collection Time: 12/22/21  3:04 PM  ? Specimen: Nasopharyngeal Swab; Nasopharyngeal(NP) swabs in vial transport medium  ?Result Value Ref Range Status  ? SARS Coronavirus 2 by RT PCR NEGATIVE NEGATIVE Final  ?  Comment: (NOTE) ?SARS-CoV-2 target nucleic acids are NOT DETECTED. ? ?The SARS-CoV-2 RNA is generally detectable in upper respiratory ?specimens during the acute phase of infection. The lowest ?concentration of SARS-CoV-2 viral copies this assay can detect is ?138 copies/mL. A negative result does not preclude SARS-Cov-2 ?infection and should not be used as the sole basis for treatment or ?other patient management decisions. A negative result may occur with  ?improper specimen collection/handling, submission of specimen other ?than nasopharyngeal swab, presence of viral mutation(s) within the ?areas targeted by  this assay, and inadequate number of viral ?copies(<138 copies/mL). A negative result must be combined with ?clinical observations, patient history, and epidemiological ?information. The expected result is

## 2021-12-24 NOTE — [place] FL2 (Signed)
?[place] MEDICAID FL2 LEVEL OF CARE SCREENING TOOL  ?  ? ?IDENTIFICATION  ?Patient Name: ?Ryan [name] Birthdate: [birth year] Sex: male Admission Date (Current Location): ?12/22/2021  ?[place] and [place] Number: [place] [facility] ?[id number] Facility and Address:  ?[facility],  [address] [place], [place] ?     Provider Number: ?[id number]  ?Attending Physician Name and Address:  ?Nita Sells, MD ? Relative Name and Phone Number:  ?Kirby Funk (aunt) Ph: [phone] ?   ?Current Level of Care: ?Hospital Recommended Level of Care: ?Assisted Living Facility Prior Approval Number: ?  ? ?Date Approved/Denied: ?  PASRR Number: ?  ? ?Discharge Plan: ?Other (Comment) ([facility]) ?  ? ?Current Diagnoses: ?Patient Active Problem List  ? Diagnosis Date Noted  ? Hospice care patient 12/24/2021  ? Palliative care by specialist   ? Goals of care, counseling/discussion   ? Depression 12/22/2021  ? Lactic acidosis 12/22/2021  ? SIRS (systemic inflammatory response syndrome) ([place]) 12/22/2021  ? AKI (acute kidney injury) ([place]) 12/22/2021  ? Generalized weakness 12/22/2021  ? Prostate cancer ([place]) 10/21/2021  ? Closed bimalleolar fracture of left ankle   ? Closed fracture of left distal radius   ? Closed left ankle fracture 06/03/2021  ? Left wrist fracture, closed, initial encounter 06/03/2021  ? Hypocalcemia 06/03/2021  ? Hyperkalemia 06/03/2021  ? Hypotension 04/07/2021  ? 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 Anemia of chronic disease 03/30/2021  ? Thrombocytopenia ([place]) 03/30/2021  ? Thoracic compression fracture, closed, initial encounter ([place]) 03/30/2021  ? Pathologic fracture of thoracic vertebrae, initial encounter 03/30/2021  ? Malnourished ([place]) 03/30/2021  ? Protein-calorie malnutrition, severe ([place]) 03/30/2021  ? 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 Infestation by bed bug 03/30/2021  ? Falls   ? Transaminitis   ? Pain   ? Closed right hip fracture ([place]) 04/01/2020  ?  Symptomatic anemia 04/01/2020  ? Hyponatremia 04/01/2020  ? Closed rib fracture 04/01/2020  ? Leukocytosis 04/01/2020  ? Rhabdomyolysis 04/01/2020  ? Dehydration 04/01/2020  ? ? ?Orientation RESPIRATION BLADDER Height & Weight   ?  ?Self, Time, Situation, Place ? Normal Continent Weight: 119 lb 14.9 oz (54.4 kg) ?Height:  '6\' 5"'$  (195.6 cm)  ?BEHAVIORAL SYMPTOMS/MOOD NEUROLOGICAL BOWEL NUTRITION STATUS  ? (N/A)  (N/A) Incontinent Diet (Regular diet)  ?AMBULATORY STATUS COMMUNICATION OF NEEDS Skin   ?Limited Assist Verbally Normal ?  ?  ?  ?    ?     ?     ? ? ?Personal Care Assistance Level of Assistance  ?Bathing, Feeding, Dressing Bathing Assistance: Limited assistance ?Feeding assistance: Independent ?Dressing Assistance: Limited assistance ?   ? ?Functional Limitations Info  ?Sight, Hearing, Speech Sight Info: Impaired ?Hearing Info: Adequate ?Speech Info: Adequate  ? ? ?SPECIAL CARE FACTORS FREQUENCY  ?    ?  ?  ?  ?  ?  ?  ?   ? ? ?Contractures Contractures Info: Not present  ? ? ?Additional Factors Info  ?Code Status, Allergies, Psychotropic Code Status Info: DNR ?Allergies Info: NKA ?Psychotropic Info: Remeron, Desyrel, Effexor ?  ?  ?   ? ?Current Medications (12/24/2021):  This is the current hospital active medication list ?Current Facility-Administered Medications  ?Medication Dose Route Frequency Provider Last Rate Last Admin  ? acetaminophen (TYLENOL) tablet 650 mg  650 mg Oral Q6H PRN Marylyn Ishihara, Tyrone A, DO   650 mg at 12/23/21 4132  ? Or  ? acetaminophen (TYLENOL) suppository 650 mg  650 mg  Rectal Q6H PRN Cherylann Ratel A, DO      ? Chlorhexidine Gluconate Cloth 2 % PADS 6 each  6 each Topical Q0600 Cherylann Ratel A, DO   6 each at 12/24/21 0559  ? feeding supplement (ENSURE ENLIVE / ENSURE PLUS) liquid 237 mL  237 mL Oral TID BM Nita Sells, MD   237 mL at 12/23/21 2018  ? hydrOXYzine (ATARAX) tablet 25 mg  25 mg Oral Q8H PRN Marylyn Ishihara, Tyrone A, DO      ? lactated ringers infusion   Intravenous Continuous  Nita Sells, MD 50 mL/hr at 12/24/21 0200 Infusion Verify at 12/24/21 0200  ? midodrine (PROAMATINE) tablet 10 mg  10 mg Oral TID WC Nita Sells, MD   10 mg at 12/24/21 0835  ? mirtazapine (REMERON) tablet 15 mg  15 mg Oral QHS Kyle, Tyrone A, DO   15 mg at 12/23/21 2121  ? morphine (PF) 2 MG/ML injection 1 mg  1 mg Intravenous Q3H PRN Nita Sells, MD      ? ondansetron (ZOFRAN) injection 4 mg  4 mg Intravenous Q6H PRN Marylyn Ishihara, Tyrone A, DO      ? traMADol (ULTRAM) tablet 50 mg  50 mg Oral Q6H PRN Marylyn Ishihara, Tyrone A, DO   50 mg at 12/24/21 0835  ? traZODone (DESYREL) tablet 50 mg  50 mg Oral QHS Kyle, Tyrone A, DO   50 mg at 12/23/21 2121  ? venlafaxine XR (EFFEXOR-XR) 24 hr capsule 37.5 mg  37.5 mg Oral Daily Kyle, Tyrone A, DO   37.5 mg at 12/24/21 0835  ? ?Discharge Medications: ?Please see discharge summary for a list of discharge medications. ? ?STOP taking these medications   ?  ?acetaminophen 500 MG tablet ?Commonly known as: TYLENOL ?   ?calcium carbonate 1250 (500 Ca) MG tablet ?Commonly known as: OS-CAL - dosed in mg of elemental calcium ?   ?enzalutamide 40 MG tablet ?Commonly known as: Xtandi ?   ?enzalutamide 80 MG tablet ?Commonly known as: XTANDI ?   ?feeding supplement Liqd ?   ?hydrOXYzine 25 MG capsule ?Commonly known as: VISTARIL ?   ?hydrOXYzine 25 MG tablet ?Commonly known as: ATARAX ?   ?magnesium oxide 400 (240 Mg) MG tablet ?Commonly known as: MAG-OX ?   ?midodrine 5 MG tablet ?Commonly known as: PROAMATINE ?   ?mirtazapine 15 MG tablet ?Commonly known as: REMERON ?   ?multivitamin tablet ?   ?ondansetron 4 MG tablet ?Commonly known as: ZOFRAN ?   ?QUEtiapine 100 MG tablet ?Commonly known as: SEROQUEL ?   ?rivaroxaban 10 MG Tabs tablet ?Commonly known as: XARELTO ?   ?sennosides-docusate sodium 8.6-50 MG tablet ?Commonly known as: SENOKOT-S ?   ?traZODone 150 MG tablet ?Commonly known as: DESYREL ?   ?traZODone 50 MG tablet ?Commonly known as: DESYREL ?   ?venlafaxine XR  37.5 MG 24 hr capsule ?Commonly known as: EFFEXOR-XR ?   ?  ?   ?  ?TAKE these medications   ?  ?clonazePAM 1 MG tablet ?Commonly known as: KlonoPIN ?Take 1 tablet (1 mg total) by mouth 3 (three) times daily. ?   ?traMADol 50 MG tablet ?Commonly known as: ULTRAM ?Take 1 tablet (50 mg total) by mouth every 6 (six) hours as needed for moderate pain. ?   ? ? ?Relevant Imaging Results: ? ?Relevant Lab Results: ? ? ?Additional Information ?SSN: [id number] ? ?Sherie Don, LCSW ? ? ? ? ?

## 2021-12-24 NOTE — Progress Notes (Signed)
Chaplain responded to spiritual consult.  Pt had rec'd news that his condition was such that he was a candidate for hospice care. At first, the patient declined the visit, but present bedside were his aunt (like a mother) and a cousin. Chaplain spoke to them for a bit and then patient began to open up.  He said he felt he was living in a bad dream and wanted a prayer for healing. He also said he wanted strength.  Chaplain offered presence and prayer which was well-received by all. "I've been telling him this," says his aunt, as chaplain said it was a time of testing but also trusting.  Chaplain will refer patient to other staff.  ?Rev. [place] Calahan Pak ?Pager [phone] ?

## 2021-12-24 NOTE — Progress Notes (Signed)
Report given to [facility] at [place]. ?

## 2021-12-24 NOTE — Progress Notes (Signed)
? ?                                                                                                                                                     ?                                                   ?Daily Progress Note  ? ?Patient Name: Ryan [name]       Date: 12/24/2021 ?DOB: 1960-10-29  Age: 61 y.o. MRN#: [id number] ?Attending Physician: Nita Sells, MD ?Primary Care Physician: Pcp, No ?Admit Date: 12/22/2021 ? ?Reason for Consultation/Follow-up: Establishing goals of care ? ?Subjective: ?Awake alert sitting up in bed, asks when he is going to be discharged, states he does not have any questions, doesn't want to discuss further.  ? ?Length of Stay: 2 ? ?Current Medications: ?Scheduled Meds:  ?? Chlorhexidine Gluconate Cloth  6 each Topical Q0600  ?? feeding supplement  237 mL Oral TID BM  ?? midodrine  10 mg Oral TID WC  ?? mirtazapine  15 mg Oral QHS  ?? traZODone  50 mg Oral QHS  ?? venlafaxine XR  37.5 mg Oral Daily  ? ? ?Continuous Infusions: ?? lactated ringers 50 mL/hr at 12/24/21 0200  ? ? ?PRN Meds: ?acetaminophen **OR** acetaminophen, hydrOXYzine, morphine injection, [DISCONTINUED] ondansetron **OR** ondansetron (ZOFRAN) IV, traMADol ? ?Physical Exam         ?Awake alert ?No distress ?Has some muscle wasting ?No edema ?No focal deficits ?Denies pain ? ?Vital Signs: BP (!) 107/43   Pulse (!) 107   Temp 98.4 ?F (36.9 ?C) (Oral)   Resp (!) 23   Ht '6\' 5"'$  (1.956 m)   Wt 54.4 kg   SpO2 95%   BMI 14.22 kg/m?  ?SpO2: SpO2: 95 % ?O2 Device: O2 Device: Room Air ?O2 Flow Rate:   ? ?Intake/output summary:  ?Intake/Output Summary (Last 24 hours) at 12/24/2021 1032 ?Last data filed at 12/24/2021 0841 ?Gross per 24 hour  ?Intake 1933.85 ml  ?Output 1450 ml  ?Net 483.85 ml  ? ?LBM: Last BM Date : 12/23/21 ?Baseline Weight: Weight: 54.4 kg ?Most recent weight: Weight: 54.4 kg ? ?     ?Palliative Assessment/Data: ? ? ? ? ? ?Patient Active Problem List  ? Diagnosis Date Noted  ?? Hospice care patient  12/24/2021  ?? Depression 12/22/2021  ?? Lactic acidosis 12/22/2021  ?? SIRS (systemic inflammatory response syndrome) ([place]) 12/22/2021  ?? AKI (acute kidney injury) ([place]) 12/22/2021  ?? Generalized weakness 12/22/2021  ?? Prostate cancer ([place]) 10/21/2021  ?? Closed bimalleolar fracture of left ankle   ?? Closed fracture of left distal radius   ?? Closed left ankle  fracture 06/03/2021  ?? Left wrist fracture, closed, initial encounter 06/03/2021  ?? Hypocalcemia 06/03/2021  ?? Hyperkalemia 06/03/2021  ?? Hypotension 04/07/2021  ?? Prostate cancer metastatic to bone ([place]) 04/06/2021  ?? Anemia of chronic disease 03/30/2021  ?? Thrombocytopenia ([place]) 03/30/2021  ?? Thoracic compression fracture, closed, initial encounter ([place]) 03/30/2021  ?? Pathologic fracture of thoracic vertebrae, initial encounter 03/30/2021  ?? Malnourished ([place]) 03/30/2021  ?? Protein-calorie malnutrition, severe ([place]) 03/30/2021  ?? 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 Infestation by bed bug 03/30/2021  ?? Falls   ?? Transaminitis   ?? Pain   ?? Closed right hip fracture ([place]) 04/01/2020  ?? Symptomatic anemia 04/01/2020  ?? Hyponatremia 04/01/2020  ?? Closed rib fracture 04/01/2020  ?? Leukocytosis 04/01/2020  ?? Rhabdomyolysis 04/01/2020  ?? Dehydration 04/01/2020  ? ? ?Palliative Care Assessment & Plan  ? ?Patient Profile: ?  ? ?Assessment: ? Advanced prostate cancer ? ?Recommendations/Plan: ? DNR ?Back to facility with hospice.  ?  ?Code Status: ? ?  ?Code Status Orders  ?(From admission, onward)  ?  ? ? ?  ? ?  Start     Ordered  ? 12/23/21 1212  Do not attempt resuscitation (DNR)  Continuous       ?Question Answer Comment  ?In the event of cardiac or respiratory ARREST Do not call a ?code blue?   ?In the event of cardiac or respiratory ARREST Do not perform Intubation, CPR, defibrillation or ACLS   ?In the event of cardiac or respiratory ARREST Use medication by any route, position, wound care, and other measures to relive pain and  suffering. May use oxygen, suction and manual treatment of airway obstruction as needed for comfort.   ?  ? 12/23/21 1212  ? ?  ?  ? ?  ? ?Code Status History   ? ? Date Active Date Inactive Code Status Order ID Comments User Context  ? 12/22/2021 2053 12/23/2021 1212 Full Code [id number]  Jonnie Finner, DO Inpatient  ? 06/03/2021 1438 08/31/2021 1916 DNR [id number]  Ivor Costa, MD ED  ? 05/05/2021 1038 06/02/2021 1625 DNR [id number]  Jimmy Footman, NP Inpatient  ? 03/30/2021 1659 05/05/2021 1038 Full Code [id number]  Criss Alvine, DO ED  ? 04/01/2020 2322 04/06/2020 2322 Full Code [id number]  Toy Baker, MD ED  ? ?  ? ? ?Prognosis: ? < 6 months ? ?Discharge Planning: ?Facility with hospice.  ? ?Care plan was discussed with patient and inter disciplinary team.  ? ?Thank you for allowing the Palliative Medicine Team to assist in the care of this patient. ? ? ?Time In: 10 Time Out: 10.25 Total Time 25 Prolonged Time Billed  no   ? ?   ?Greater than 50%  of this time was spent counseling and coordinating care related to the above assessment and plan. ? ?Loistine Chance, MD ? ?Please contact Palliative Medicine Team phone at [phone] for questions and concerns.  ? ? ? ? ? ?

## 2021-12-24 NOTE — Progress Notes (Signed)
I was asked to comment on [name] diagnosis and prognosis.  He is a 61 year old man I saw him consultation in the outpatient setting 1 time with advanced prostate cancer.  Please see my note from January 2023.  He is a 61 year old man with advanced prostate cancer with widespread metastatic disease and is currently on Xtandi.  He was hospitalized with hypotension and anemia and overall failure to thrive. ? ?His laboratory data and clinical status was reviewed and discussed today with patient in detail.  His cancer prognosis was also reiterated.  He understands that he has an incurable malignancy which was discussed today with him in detail.  His prognosis is overall poor with limited life expectancy given the advanced nature of his disease and rapid progression. ? ?It is unclear what his life expectancy is but is likely in the months rather than years.  I agree with the DNR CODE STATUS and treatment of fall reversible conditions and diagnoses. ? ?He appears to be clinically stable but overall weak and very limited performance status.  We discussed today in details his life expectancy which she is having difficulty accepting but I continue to reiterate the facts of his advanced malignancy. ? ?Overall, I agree with the current management of the primary team.  Recommend continued supportive management without aggressive measures at this time. ? ?Please call with any questions. ? ?35  minutes were dedicated to this visit.  More than 50% of the time was face-to-face.  The time was spent on reviewing laboratory data, imaging studies, discussing treatment options, discussing prognosis and answering questions regarding future plan. ? ?

## 2021-12-24 NOTE — TOC Transition Note (Signed)
Transition of Care (TOC) - CM/SW Discharge Note ? ?Patient Details  ?Name: Ryan [name] ?MRN: [id number] ?Date of Birth: 03-26-1961 ? ?Transition of Care (TOC) CM/SW Contact:  ?Sherie Don, LCSW ?Phone Number: ?12/24/2021, 1:52 PM ? ?Clinical Narrative: Patient is ready to discharge back to [facility] with hospice services through [facility]. FL2 done. CSW spoke with [place] at [place] [phone]) to confirm patient can return today. FL2 and discharge summary faxed to facility for review [phone]). Report to be [facility] to [facility] at [phone]. Medical necessity form done; PTAR scheduled. Discharge packet completed. RN updated. TOC signing off. ? ?Final next level of care: Assisted Living ?Barriers to Discharge: Barriers Resolved ? ?Patient Goals and CMS Choice ?CMS Medicare.gov Compare Post Acute Care list provided to:: Patient ?Choice offered to / list presented to : Patient ? ?Discharge Placement       ?Patient chooses bed at: Other - please specify in the comment section below: [facility]) ?Patient to be transferred to facility by: PTAR ?Patient and family notified of of transfer: 12/24/21 ? ?Discharge Plan and Services ?In-house Referral: Clinical Social Work ?Post Acute Care Choice: Hospice          ?DME Arranged: N/A ?DME Agency: NA ? ?Readmission Risk Interventions ?Readmission Risk Prevention Plan 12/23/2021 04/23/2021  ?Transportation Screening Complete Complete  ?PCP or Specialist Appt within 3-5 Days - Complete  ?[place] or Home Care Consult - Complete  ?Social Work Consult for [place] Planning/Counseling - Not Complete  ?SW consult not completed comments - RNCM assigned to patient  ?Palliative Care Screening - Complete  ?Medication Review Press photographer) Complete Complete  ?[place] or Home Care Consult Complete -  ?SW Recovery Care/Counseling Consult Complete -  ?Palliative Care Screening Complete -  ?[place] Not Applicable -  ?Some recent data might  be hidden  ? ?

## 2021-12-25 LAB — URINE CULTURE: Culture: 400 — AB

## 2021-12-25 NOTE — Progress Notes (Signed)
Transport arrived to unit to transport pt to [place];  Pt A&Ox4, denies pain, without s/s or c/o respiratory distress.  No IV present on discharge; Vitals WNL.   ?

## 2021-12-26 LAB — TYPE AND SCREEN
ABO/RH(D): O POS
Antibody Screen: NEGATIVE
Unit division: 0
Unit division: 0
Unit division: 0

## 2021-12-26 LAB — BPAM RBC
Blood Product Expiration Date: 202304092359
Blood Product Expiration Date: 202304092359
Blood Product Expiration Date: 202304092359
ISSUE DATE / TIME: 202303151722
ISSUE DATE / TIME: 202303160040
Unit Type and Rh: 5100
Unit Type and Rh: 5100
Unit Type and Rh: 5100

## 2021-12-27 LAB — CULTURE, BLOOD (ROUTINE X 2)
Culture: NO GROWTH
Culture: NO GROWTH
Special Requests: ADEQUATE
Special Requests: ADEQUATE

## 2021-12-29 ENCOUNTER — Inpatient Hospital Stay: Payer: Medicaid Other

## 2021-12-29 ENCOUNTER — Other Ambulatory Visit: Payer: Self-pay | Admitting: *Deleted

## 2021-12-29 ENCOUNTER — Inpatient Hospital Stay: Payer: Medicaid Other | Admitting: Oncology

## 2021-12-29 NOTE — Progress Notes (Signed)
Called pt POA to f/u on missed appt. Per POA, pt is now on Hospice. Provider made aware ?

## 2022-01-08 DEATH — deceased

## 2022-08-29 ENCOUNTER — Other Ambulatory Visit (HOSPITAL_COMMUNITY): Payer: Self-pay

## 2022-10-14 ENCOUNTER — Other Ambulatory Visit (HOSPITAL_COMMUNITY): Payer: Self-pay

## 2023-01-01 IMAGING — DX DG ANKLE COMPLETE 3+V*L*
3 series · 3 of 3 positions shown · non-contrast
Comparison: 06/02/2021; CT the chest, abdomen and pelvis-03/30/2021

CLINICAL DATA: Follow-up left ankle fracture

EXAM:
LEFT ANKLE COMPLETE - 3+ VIEW

[ankle ap]
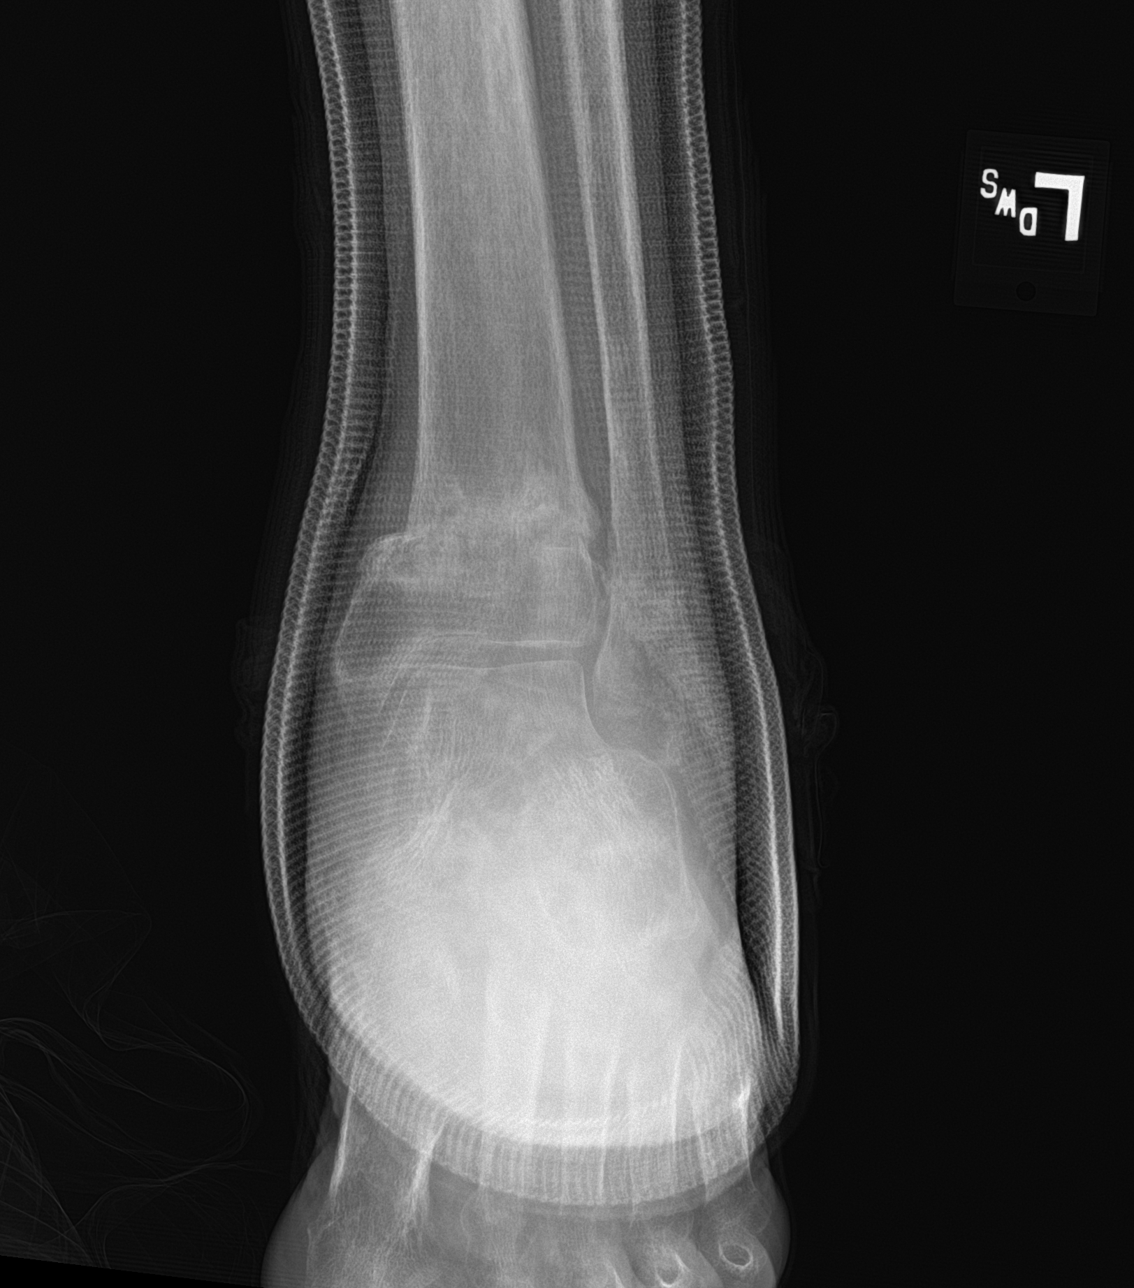

[ankle obl]
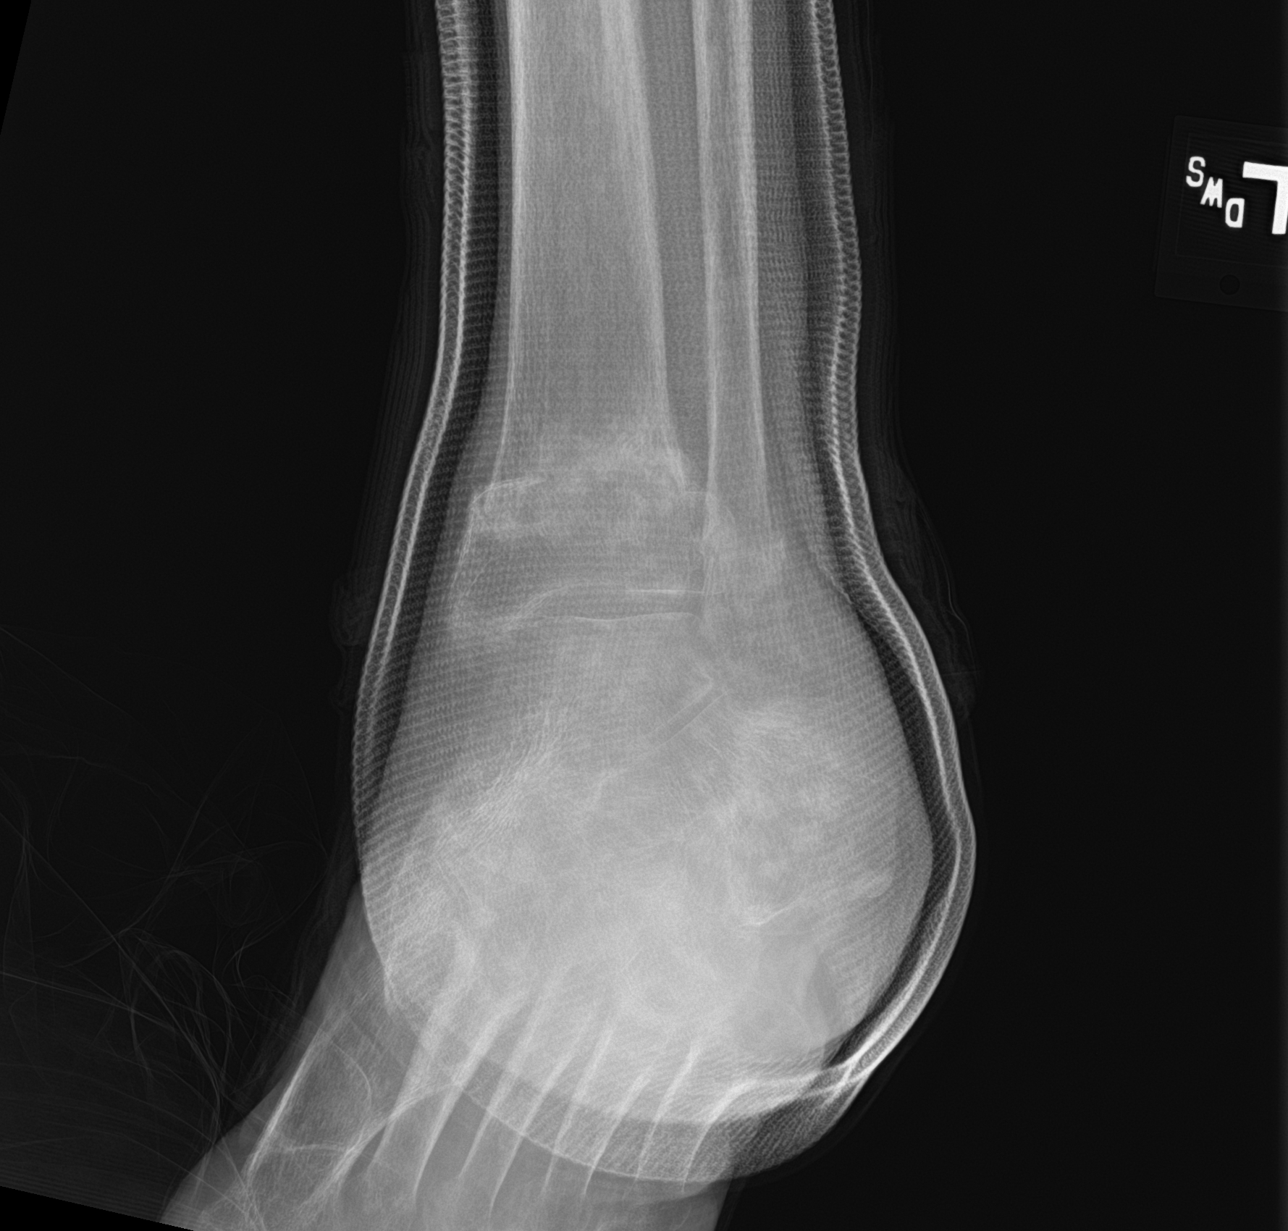

[ankle lat]
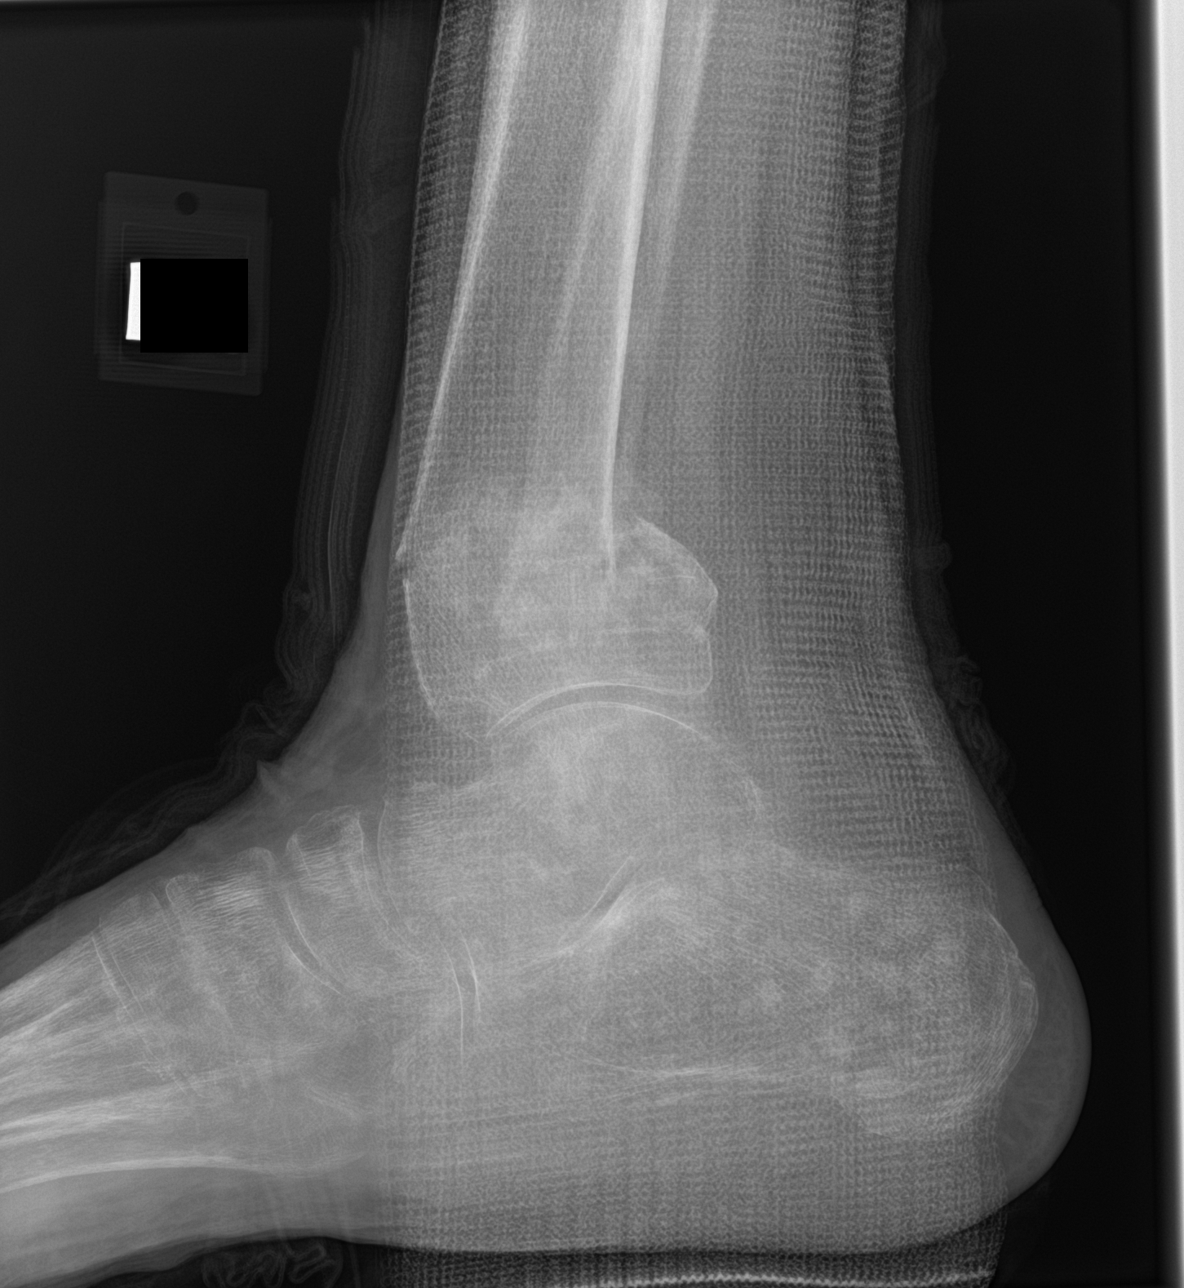

[3 of 3 positions shown; findings below may reference images not displayed]

FINDINGS: Evaluation of fine bony detail is degraded secondary to overlying
casting material.

Expected callus formation about known transverse fractures of the
distal tibial and fibular metaphyses with slightly worsened
impaction and angulation of the distal tibial fracture, apex
anterolateral. The distal fibular fracture appears unchanged in
alignment.

No additional fractures identified. Redemonstrated multiple
sclerotic lesions within the calcaneus, talus and several midfoot
tarsal bones worrisome for osseous metastatic disease as
demonstrated on CT the chest, abdomen pelvis performed 03/30/2021
IMPRESSION: 1. Minimal displacement/impaction of known distal tibial fracture,
apex anterolateral.
2. No definitive change in alignment of known distal fibular
fracture.
3. Redemonstrated multiple sclerotic lesions within the osseous
structures of the foot compatible with known advanced osseous
metastatic disease demonstrated on CT the chest, abdomen pelvis
performed 03/30/2021.

## 2023-06-21 IMAGING — DX DG CHEST 1V PORT
1 series · 1 of 1 positions shown · non-contrast
Comparison: Portable exam 1842 hours compared to 04/25/2021

CLINICAL DATA: Hypotension, weakness, dizziness, prostate cancer

EXAM:
PORTABLE CHEST 1 VIEW

[chest ap]
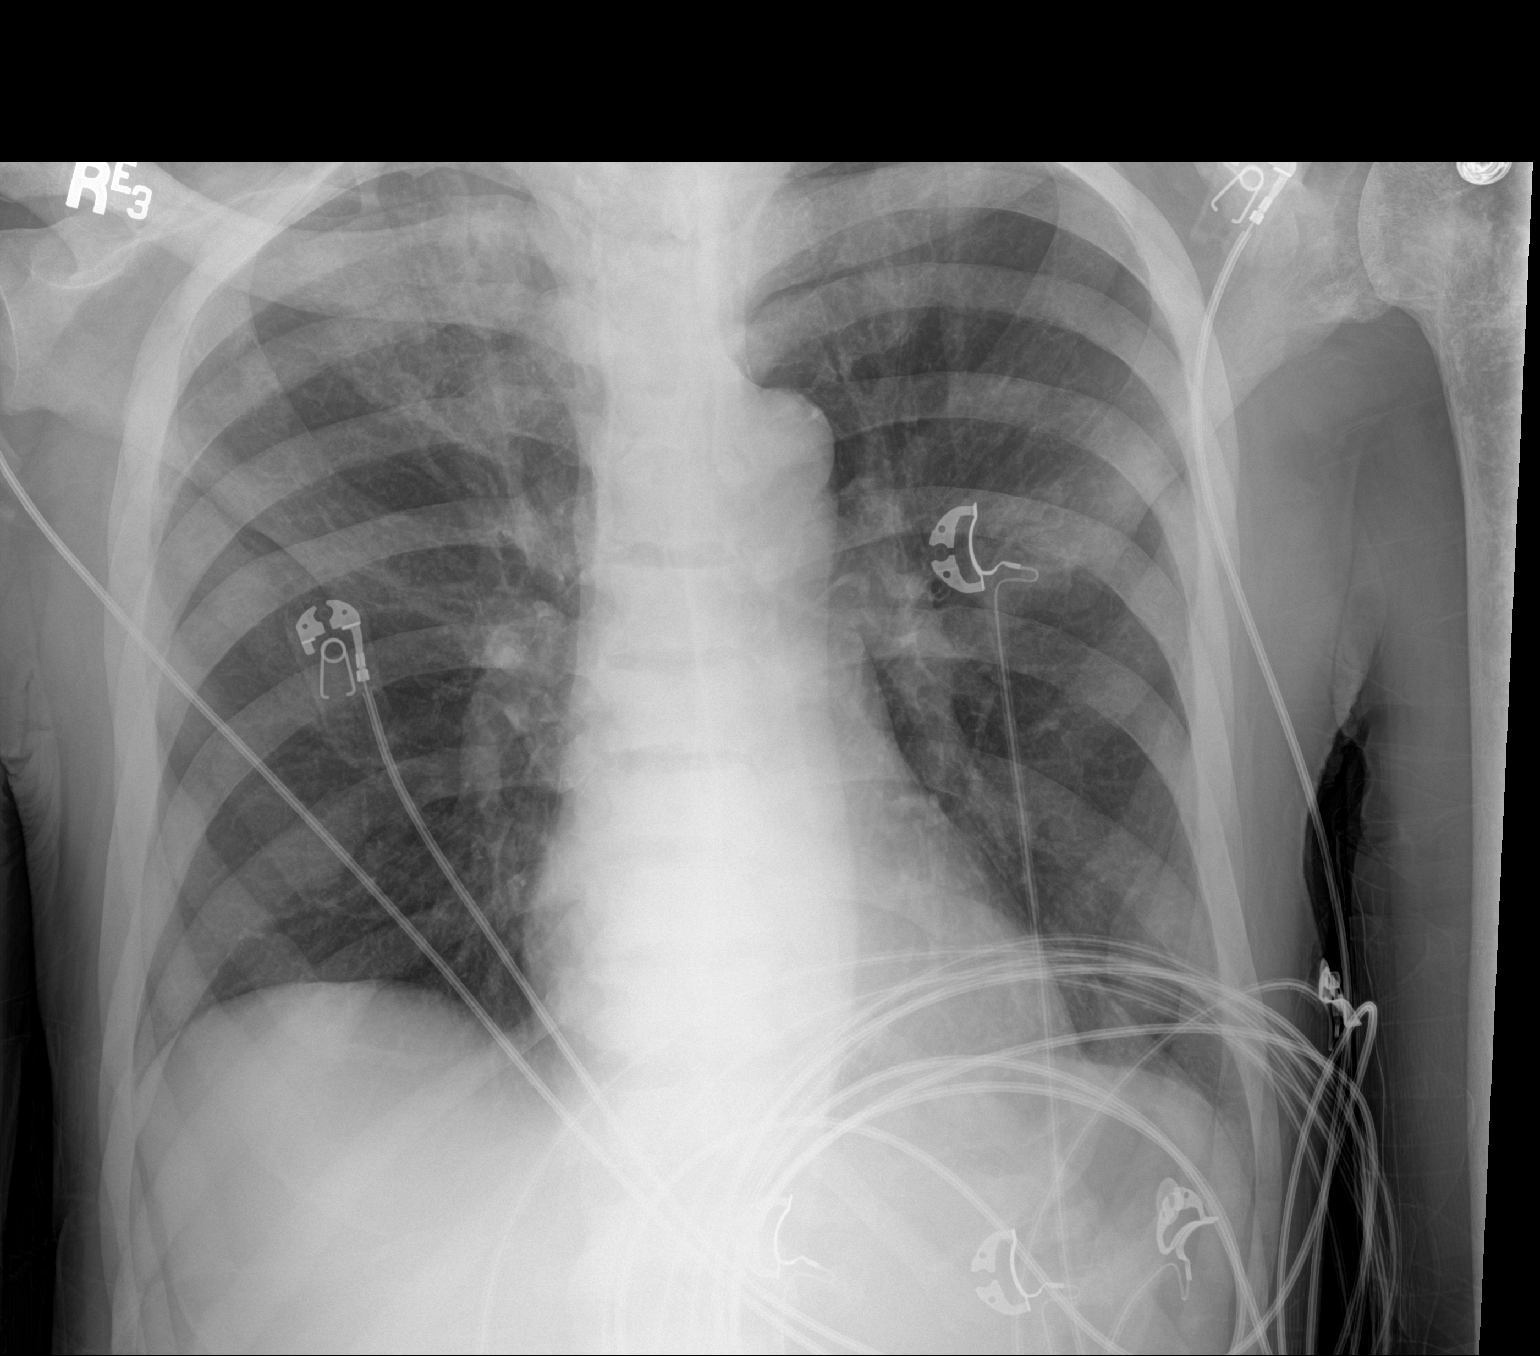

[1 of 1 positions shown; findings below may reference images not displayed]

FINDINGS: Normal heart size, mediastinal contours, and pulmonary vascularity.

Atherosclerotic calcification aorta.

Lungs clear.

No pulmonary infiltrate, pleural effusion, or pneumothorax.

Diffuse sclerotic osseous metastatic disease changes again seen.
IMPRESSION: No acute abnormalities.

Sclerotic osseous metastatic disease.

Aortic Atherosclerosis (BMDDM-INH.H).

## 2023-06-21 IMAGING — US US RENAL
1 series · 15 of 25 positions shown · non-contrast
Comparison: None.

CLINICAL DATA: Acute kidney injury

EXAM:
RENAL / URINARY TRACT ULTRASOUND COMPLETE

[Series 1: us renal mc & wl · 15 of 42 slices shown]
[im 1/42]
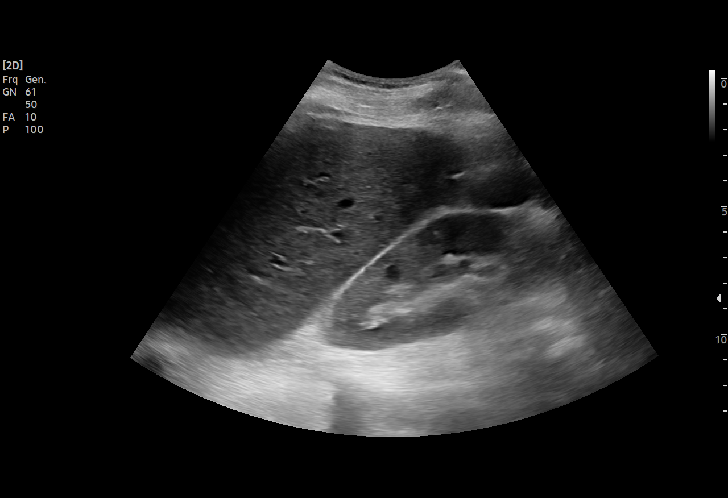
[im 4/42]
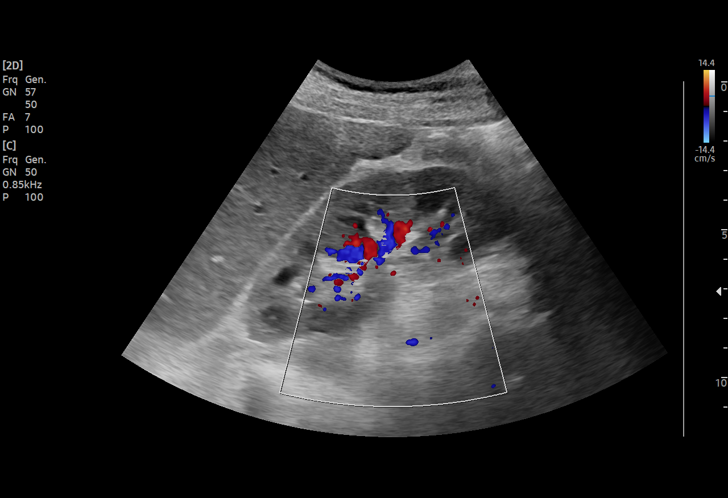
[im 7/42]
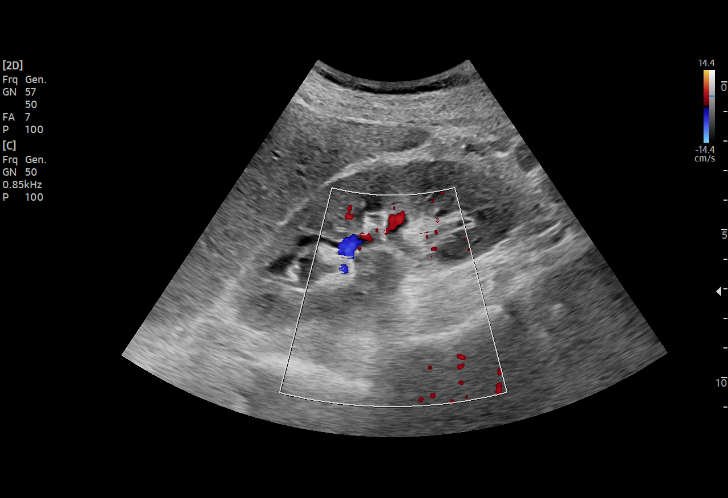
[im 9/42]
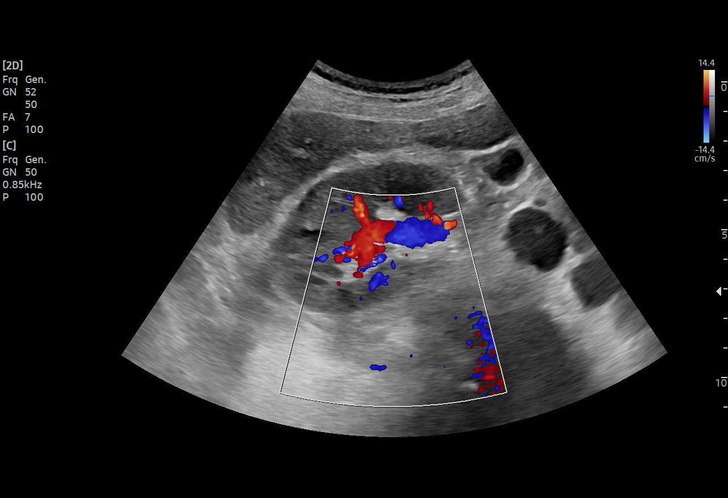
[im 12/42]
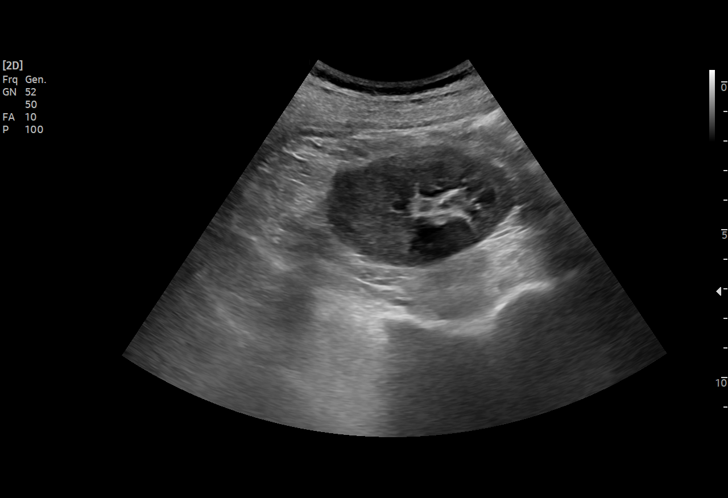
[im 16/42]
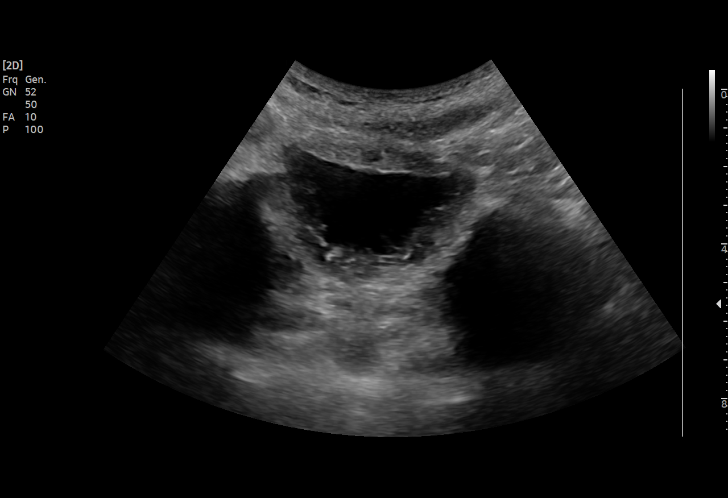
[im 18/42]
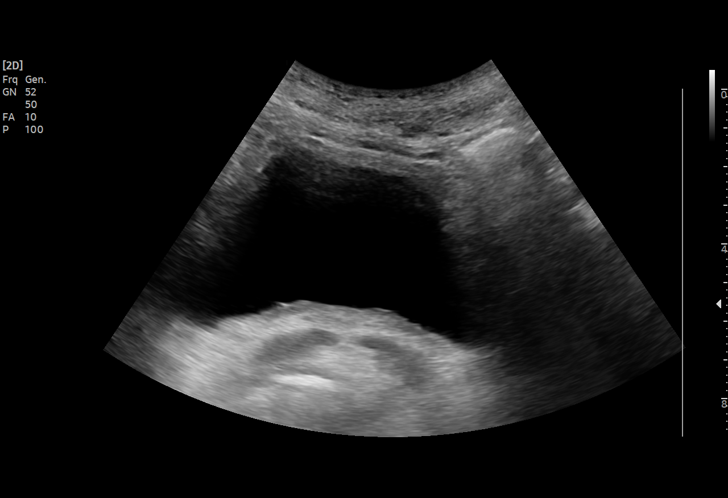
[im 21/42]
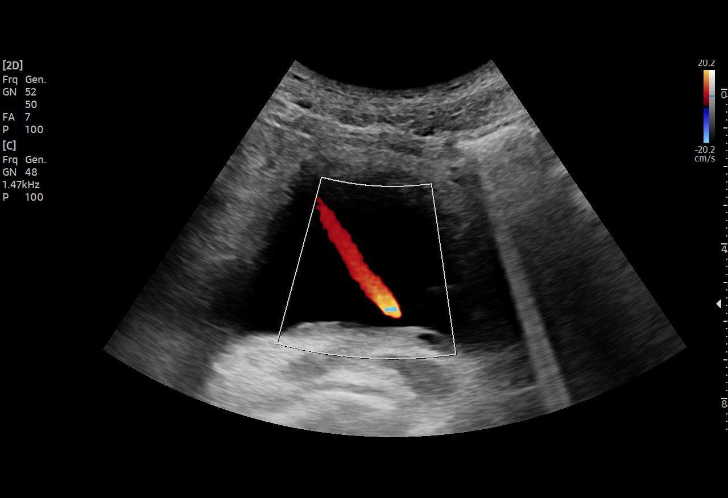
[im 24/42]
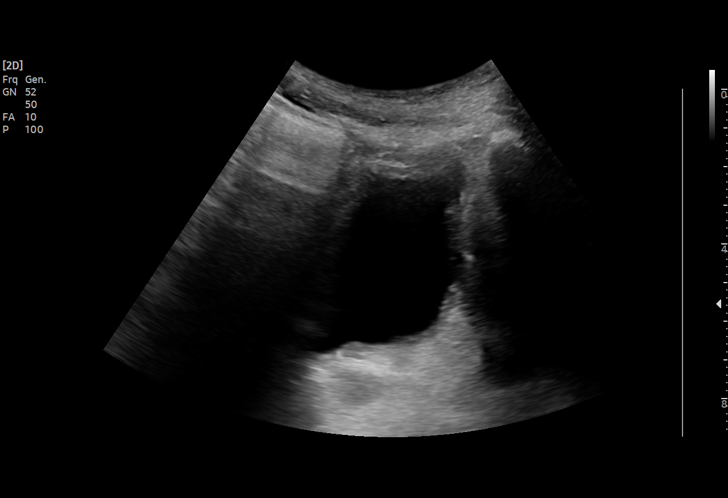
[im 26/42]
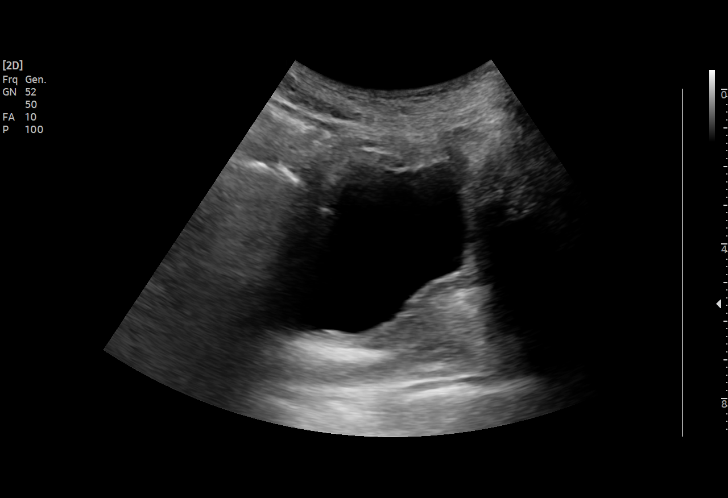
[im 30/42]
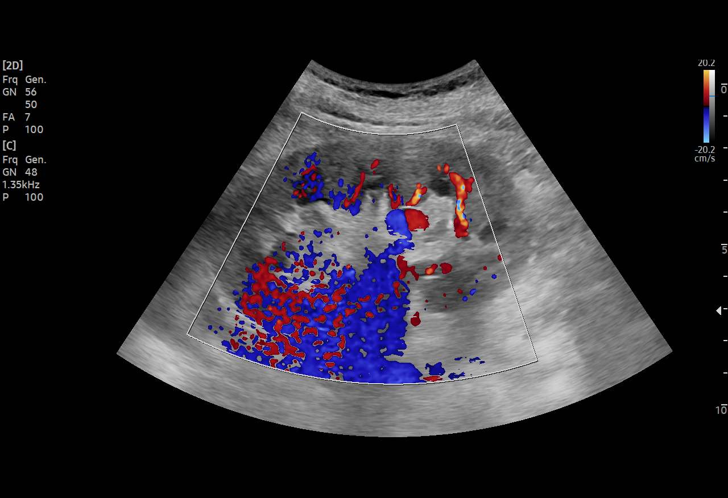
[im 33/42]
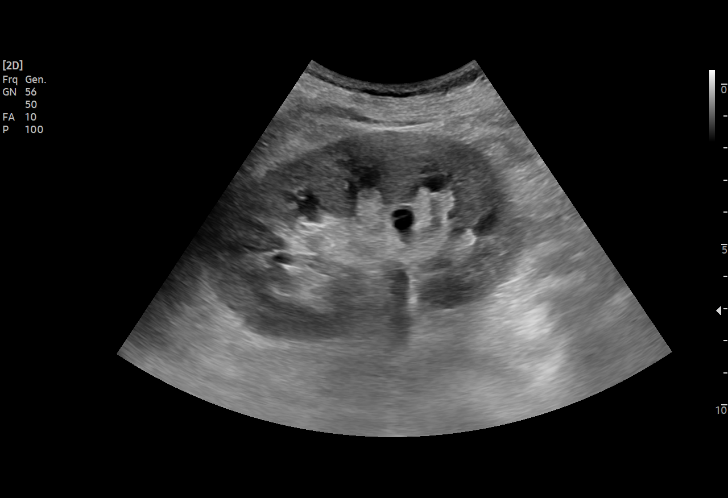
[im 35/42]
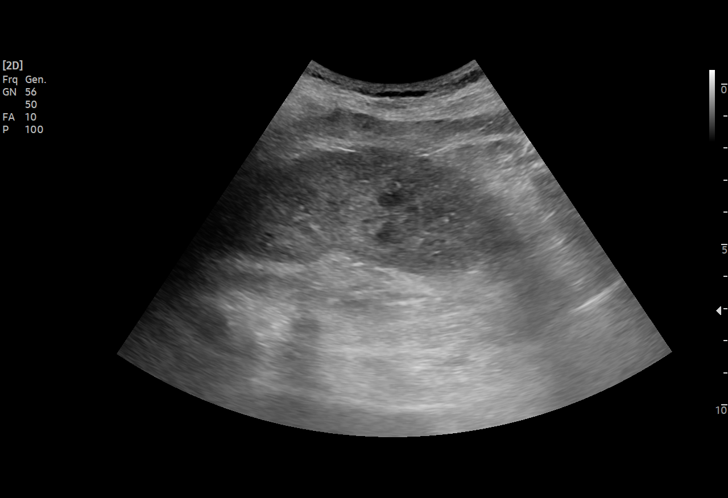
[im 38/42]
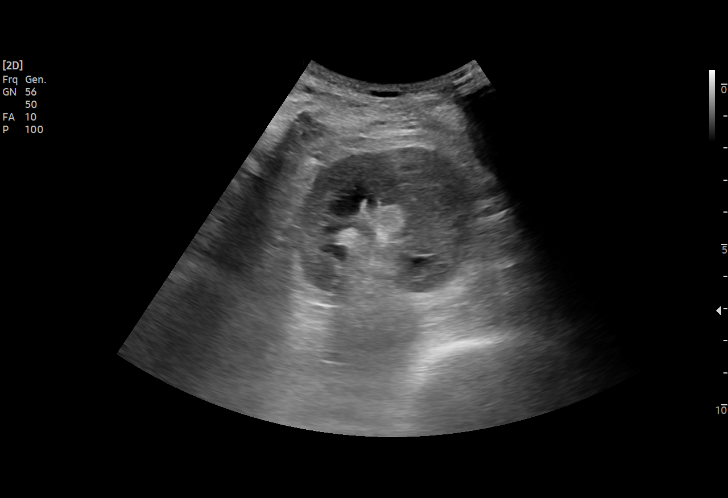
[im 42/42]
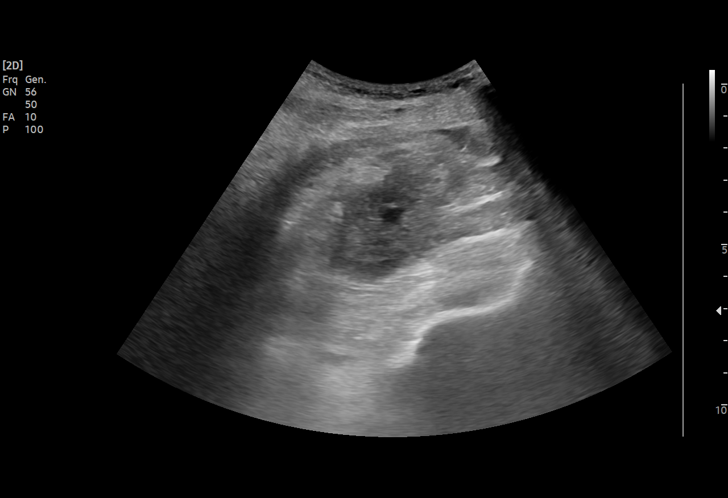

[15 of 25 positions shown; findings below may reference images not displayed]

FINDINGS: Right Kidney:

Renal measurements: 11.1 x 4.4 x 5.2 cm = volume: 131 mL. No
hydronephrosis or mass. Increased echotexture.

Left Kidney:

Renal measurements: 10.1 x 5.7 x 4.9 cm = volume: 148 mL. Diffusely
increased echotexture. No mass or hydronephrosis.

Bladder:

Appears normal for degree of bladder distention.

Other:

None.
IMPRESSION: No acute findings.  No hydronephrosis.

Increased echotexture throughout the kidneys paddle with chronic
medical renal disease.
